# Patient Record
Sex: Male | Born: 1955
Health system: Southern US, Community
[De-identification: ages and names within clinical notes are randomized; demographics above are authoritative.]

## PROBLEM LIST (undated history)

## (undated) DIAGNOSIS — R0602 Shortness of breath: Secondary | ICD-10-CM

## (undated) DIAGNOSIS — Z87442 Personal history of urinary calculi: Secondary | ICD-10-CM

## (undated) DIAGNOSIS — K7460 Unspecified cirrhosis of liver: Secondary | ICD-10-CM

## (undated) DIAGNOSIS — Z950 Presence of cardiac pacemaker: Secondary | ICD-10-CM

## (undated) DIAGNOSIS — B029 Zoster without complications: Secondary | ICD-10-CM

## (undated) DIAGNOSIS — M1990 Unspecified osteoarthritis, unspecified site: Secondary | ICD-10-CM

## (undated) DIAGNOSIS — Z8349 Family history of other endocrine, nutritional and metabolic diseases: Secondary | ICD-10-CM

## (undated) DIAGNOSIS — E079 Disorder of thyroid, unspecified: Secondary | ICD-10-CM

## (undated) DIAGNOSIS — I2510 Atherosclerotic heart disease of native coronary artery without angina pectoris: Secondary | ICD-10-CM

## (undated) DIAGNOSIS — Z95810 Presence of automatic (implantable) cardiac defibrillator: Secondary | ICD-10-CM

## (undated) DIAGNOSIS — K219 Gastro-esophageal reflux disease without esophagitis: Secondary | ICD-10-CM

## (undated) DIAGNOSIS — G4733 Obstructive sleep apnea (adult) (pediatric): Secondary | ICD-10-CM

## (undated) DIAGNOSIS — J449 Chronic obstructive pulmonary disease, unspecified: Secondary | ICD-10-CM

## (undated) DIAGNOSIS — E039 Hypothyroidism, unspecified: Secondary | ICD-10-CM

## (undated) DIAGNOSIS — R001 Bradycardia, unspecified: Secondary | ICD-10-CM

## (undated) DIAGNOSIS — E785 Hyperlipidemia, unspecified: Secondary | ICD-10-CM

## (undated) HISTORY — PX: NECK SURGERY: SHX720

## (undated) HISTORY — DX: Hyperlipidemia, unspecified: E78.5

## (undated) HISTORY — DX: Unspecified osteoarthritis, unspecified site: M19.90

## (undated) HISTORY — DX: Shortness of breath: R06.02

## (undated) HISTORY — DX: Atherosclerotic heart disease of native coronary artery without angina pectoris: I25.10

## (undated) HISTORY — DX: Disorder of thyroid, unspecified: E07.9

## (undated) HISTORY — DX: Family history of other endocrine, nutritional and metabolic diseases: Z83.49

## (undated) HISTORY — DX: Bradycardia, unspecified: R00.1

## (undated) HISTORY — DX: Gastro-esophageal reflux disease without esophagitis: K21.9

## (undated) HISTORY — PX: KNEE SURGERY: SHX244

## (undated) HISTORY — DX: Obstructive sleep apnea (adult) (pediatric): G47.33

## (undated) HISTORY — PX: HEEL SPUR SURGERY: SHX665

---

## 2008-04-25 ENCOUNTER — Emergency Department (HOSPITAL_COMMUNITY): Admission: EM | Admit: 2008-04-25 | Discharge: 2008-04-26 | Payer: Self-pay | Admitting: Emergency Medicine

## 2010-11-17 DIAGNOSIS — B029 Zoster without complications: Secondary | ICD-10-CM

## 2010-11-17 HISTORY — DX: Zoster without complications: B02.9

## 2011-08-14 LAB — URINALYSIS, ROUTINE W REFLEX MICROSCOPIC
Bilirubin Urine: NEGATIVE
Glucose, UA: NEGATIVE
Ketones, ur: NEGATIVE
Leukocytes, UA: NEGATIVE
Nitrite: NEGATIVE
Protein, ur: NEGATIVE
Specific Gravity, Urine: 1.022
Urobilinogen, UA: 1
pH: 5.5

## 2011-08-14 LAB — URINE MICROSCOPIC-ADD ON

## 2016-11-12 ENCOUNTER — Other Ambulatory Visit: Payer: Self-pay | Admitting: Family Medicine

## 2016-11-12 DIAGNOSIS — F17210 Nicotine dependence, cigarettes, uncomplicated: Secondary | ICD-10-CM

## 2016-11-18 ENCOUNTER — Ambulatory Visit
Admission: RE | Admit: 2016-11-18 | Discharge: 2016-11-18 | Disposition: A | Discharge status: Home / Self Care | Payer: 59 | Source: Ambulatory Visit | Attending: Family Medicine | Admitting: Family Medicine

## 2016-11-18 DIAGNOSIS — F17210 Nicotine dependence, cigarettes, uncomplicated: Secondary | ICD-10-CM

## 2017-01-05 ENCOUNTER — Telehealth: Payer: Self-pay | Admitting: Cardiology

## 2017-01-05 ENCOUNTER — Encounter: Payer: Self-pay | Admitting: Cardiology

## 2017-01-05 NOTE — Telephone Encounter (Signed)
NOTES FAXED TO [facility] °

## 2017-01-05 NOTE — Telephone Encounter (Signed)
Received records from [facility] for appointment on 01/14/17 with Dr Martinique.  Records put with Dr Doug Sou schedule for 2/28/281. lp

## 2017-01-13 NOTE — Progress Notes (Signed)
Cardiology Office Note    Date:  01/14/2017   ID:  Austin, Oliver 21-Aug-1956, MRN [id number]  PCP:  [name], MD  Cardiologist:  Michaeljoseph Revolorio Martinique, MD    History of Present Illness:  Austin Oliver is a 61 y.o. male seen at the request of Dr. Arelia Sneddon for pre op cardiac evaluation for knee surgery. He reports I saw him > 20 yrs ago for evaluation of chest pain. He had a normal stress test and was treated for GERD. He has extensive joint disease with multiple surgeries in the past. Now needs left hip surgery. Seeing Dr. Delfino Lovett. Noted on physical to have marked bradycardia with HR down to 42. States HR has been slow for last year. Denies any dizziness or syncope. Some fatigue but has been more inactive due to joint problems. No SOB or chest pain. He does have a history of hyperlipidemia and smoking. Recently quit smoking in January. Has been on a statin but complained of myalgias. Dose cut to every other day. He did have a chest CT in January for cancer screening. This demonstrated extensive coronary calcification.   Past Medical History:  Diagnosis Date  . Bradycardia   . Coronary artery calcification seen on CAT scan 01/14/2017  . Family history of thyroid problem   . GERD (gastroesophageal reflux disease)   . Hyperlipidemia   . Osteoarthritis   . Thyroid disease     Past Surgical History:  Procedure Laterality Date  . HEEL SPUR SURGERY    . KNEE SURGERY Bilateral   . NECK SURGERY      Current Medications: Outpatient Medications Prior to Visit  Medication Sig Dispense Refill  . aspirin EC 81 MG tablet Take 81 mg by mouth daily.    . cimetidine (TAGAMET) 800 MG tablet Take 800 mg by mouth at bedtime.    Marland Kitchen levothyroxine (SYNTHROID, LEVOTHROID) 75 MCG tablet Take 75 mcg by mouth daily before breakfast.    . loteprednol (LOTEMAX) 0.5 % ophthalmic suspension Place 1 drop into the left eye daily.    Marland Kitchen lovastatin (MEVACOR) 20 MG tablet Take 20 mg by mouth at bedtime.    .  Omega-3 Fatty Acids (FISH OIL OMEGA-3) 1000 MG CAPS Take 1 capsule by mouth daily.    Marland Kitchen terazosin (HYTRIN) 5 MG capsule Take 5 mg by mouth at bedtime.    . traMADol (ULTRAM) 50 MG tablet Take 50 mg by mouth every 6 (six) hours as needed for moderate pain.    Marland Kitchen trifluridine (VIROPTIC) 1 % ophthalmic solution Place 1 drop into the left eye daily.     . valACYclovir (VALTREX) 500 MG tablet Take 500 mg by mouth daily.     No facility-administered medications prior to visit.      Allergies:   Clindamycin/lincomycin   Social History   Social History  . Marital status: Married    Spouse name: [name]  . Number of children: 2  . Years of education: N/A   Occupational History  . RETIRED-check printer    Social History Main Topics  . Smoking status: Former Smoker    Quit date: 12/05/2016  . Smokeless tobacco: Never Used  . Alcohol use No  . Drug use: No  . Sexual activity: Not Asked   Other Topics Concern  . None   Social History Narrative  . None     Family History:  The patient's family history includes COPD in his father; Dementia in his mother; Heart Problems in  his father; Lung cancer in his father.   ROS:   Please see the history of present illness.    ROS All other systems reviewed and are negative.   PHYSICAL EXAM:   VS:  BP 132/76   Pulse (!) 48   Ht 5\' 9"  (1.753 m)   Wt 236 lb (107 kg)   SpO2 97%   BMI 34.85 kg/m    GEN: Well nourished, well developed, in no acute distress  HEENT: normal  Neck: no JVD, carotid bruits, or masses Cardiac: RRR; no murmurs, rubs, or gallops,no edema  Respiratory:  clear to auscultation bilaterally, normal work of breathing GI: soft, nontender, nondistended, + BS MS: no deformity or atrophy  Skin: warm and dry, no rash Neuro:  Alert and Oriented x 3, Strength and sensation are intact Psych: euthymic mood, full affect  Wt Readings from Last 3 Encounters:  01/14/17 236 lb (107 kg)      Studies/Labs Reviewed:   EKG:  EKG is  not ordered today.  The ekg from Dr. Arelia Sneddon shows marked sinus brady. Otherwise normal. I have personally reviewed and interpreted this study.   Recent Labs: No results found for requested labs within last 8760 hours.   Lipid Panel No results found for: CHOL, TRIG, HDL, CHOLHDL, VLDL, LDLCALC, LDLDIRECT  Additional studies/ records that were reviewed today include:  Labs from 12/24/16: TSH 5.59, glucose 93, creatinine 1.23. Hgb 13.1.   ASSESSMENT:    1. Mixed hyperlipidemia   2. Sinus bradycardia   3. Coronary artery calcification seen on CAT scan      PLAN:   1. Patient has extensive coronary calcification on CT. He is asymptomatic. This indicates he has CAD and would argue for more aggressive lipid lowering therapy. Continue ASA. Will arrange for a Lexiscan myoview to assess cardiac risk especially given need for general anesthesia.  2. He does have bradycardia but really no significant symptoms. He is on no rate slowing medication. Will have him wear a 24 hour Holter monitor to look for more serious pauses. If he just has sinus brady and is asymptomatic- no further treatment needed.  3. Agree with statin therapy. If unable to tolerate lovastatin would try a different statin at low dose such as Crestor. Continue fish oil for elevated triglycerides per history.     Medication Adjustments/Labs and Tests Ordered: Current medicines are reviewed at length with the patient today.  Concerns regarding medicines are outlined above.  Medication changes, Labs and Tests ordered today are listed in the Patient Instructions below. Patient Instructions  We will have you wear a monitor for 24 hours  We will schedule you for a nuclear stress test   If these studies are ok we will clear you for hip surgery.  Work with Dr Arelia Sneddon on your cholesterol      Signed, Akeya Ryther Martinique, MD  01/14/2017 10:04 AM    [facility] [address], [place], [place],  [address] [phone]

## 2017-01-14 ENCOUNTER — Ambulatory Visit (INDEPENDENT_AMBULATORY_CARE_PROVIDER_SITE_OTHER): Payer: 59 | Admitting: Cardiology

## 2017-01-14 ENCOUNTER — Encounter: Payer: Self-pay | Admitting: Cardiology

## 2017-01-14 DIAGNOSIS — R001 Bradycardia, unspecified: Secondary | ICD-10-CM | POA: Diagnosis not present

## 2017-01-14 DIAGNOSIS — I25118 Atherosclerotic heart disease of native coronary artery with other forms of angina pectoris: Secondary | ICD-10-CM | POA: Insufficient documentation

## 2017-01-14 DIAGNOSIS — E782 Mixed hyperlipidemia: Secondary | ICD-10-CM | POA: Insufficient documentation

## 2017-01-14 DIAGNOSIS — I2510 Atherosclerotic heart disease of native coronary artery without angina pectoris: Secondary | ICD-10-CM

## 2017-01-14 DIAGNOSIS — E785 Hyperlipidemia, unspecified: Secondary | ICD-10-CM | POA: Insufficient documentation

## 2017-01-14 HISTORY — DX: Atherosclerotic heart disease of native coronary artery without angina pectoris: I25.10

## 2017-01-14 NOTE — Patient Instructions (Signed)
We will have you wear a monitor for 24 hours  We will schedule you for a nuclear stress test   If these studies are ok we will clear you for hip surgery.  Work with Dr Arelia Sneddon on your cholesterol

## 2017-01-15 ENCOUNTER — Telehealth (HOSPITAL_COMMUNITY): Payer: Self-pay

## 2017-01-15 NOTE — Telephone Encounter (Signed)
Encounter complete. 

## 2017-01-16 ENCOUNTER — Ambulatory Visit (HOSPITAL_COMMUNITY)
Admission: RE | Admit: 2017-01-16 | Discharge: 2017-01-16 | Disposition: A | Discharge status: Home / Self Care | Payer: 59 | Source: Ambulatory Visit | Attending: Cardiovascular Disease | Admitting: Cardiovascular Disease

## 2017-01-16 DIAGNOSIS — Z87891 Personal history of nicotine dependence: Secondary | ICD-10-CM | POA: Diagnosis not present

## 2017-01-16 DIAGNOSIS — E782 Mixed hyperlipidemia: Secondary | ICD-10-CM | POA: Diagnosis not present

## 2017-01-16 DIAGNOSIS — R001 Bradycardia, unspecified: Secondary | ICD-10-CM | POA: Diagnosis not present

## 2017-01-16 DIAGNOSIS — E669 Obesity, unspecified: Secondary | ICD-10-CM | POA: Diagnosis not present

## 2017-01-16 DIAGNOSIS — I2510 Atherosclerotic heart disease of native coronary artery without angina pectoris: Secondary | ICD-10-CM

## 2017-01-16 DIAGNOSIS — Z6834 Body mass index (BMI) 34.0-34.9, adult: Secondary | ICD-10-CM | POA: Insufficient documentation

## 2017-01-16 DIAGNOSIS — Z8249 Family history of ischemic heart disease and other diseases of the circulatory system: Secondary | ICD-10-CM | POA: Insufficient documentation

## 2017-01-16 LAB — MYOCARDIAL PERFUSION IMAGING
LV dias vol: 158 mL (ref 62–150)
LV sys vol: 89 mL
Peak HR: 58 {beats}/min
Rest HR: 42 {beats}/min
SDS: 0
SRS: 1
SSS: 1
TID: 1.03

## 2017-01-16 MED ORDER — TECHNETIUM TC 99M TETROFOSMIN IV KIT
32.0000 | PACK | Freq: Once | INTRAVENOUS | Status: AC | PRN
  Administered 2017-01-16: 32 via INTRAVENOUS
  Filled 2017-01-16: qty 32

## 2017-01-16 MED ORDER — TECHNETIUM TC 99M TETROFOSMIN IV KIT
9.7000 | PACK | Freq: Once | INTRAVENOUS | Status: AC
  Administered 2017-01-16: 9.7 via INTRAVENOUS
  Filled 2017-01-16: qty 10

## 2017-01-16 MED ORDER — REGADENOSON 0.4 MG/5ML IV SOLN
0.4000 mg | Freq: Once | INTRAVENOUS | Status: AC
  Administered 2017-01-16: 0.4 mg via INTRAVENOUS

## 2017-01-19 ENCOUNTER — Other Ambulatory Visit: Payer: Self-pay

## 2017-01-19 DIAGNOSIS — R001 Bradycardia, unspecified: Secondary | ICD-10-CM

## 2017-01-19 DIAGNOSIS — I2510 Atherosclerotic heart disease of native coronary artery without angina pectoris: Secondary | ICD-10-CM

## 2017-01-20 ENCOUNTER — Ambulatory Visit (INDEPENDENT_AMBULATORY_CARE_PROVIDER_SITE_OTHER): Payer: 59

## 2017-01-20 DIAGNOSIS — I2510 Atherosclerotic heart disease of native coronary artery without angina pectoris: Secondary | ICD-10-CM | POA: Diagnosis not present

## 2017-01-20 DIAGNOSIS — R001 Bradycardia, unspecified: Secondary | ICD-10-CM

## 2017-01-20 DIAGNOSIS — E782 Mixed hyperlipidemia: Secondary | ICD-10-CM | POA: Diagnosis not present

## 2017-01-20 NOTE — Telephone Encounter (Signed)
Encounter complete. 

## 2017-02-02 ENCOUNTER — Telehealth: Payer: Self-pay

## 2017-02-02 NOTE — Telephone Encounter (Signed)
Received a request from [place] office requesting 01/16/17 Lexiscan report and 01/20/17 Holter Monitor.Reports sent to Dr.Elkins via epic.

## 2017-02-04 ENCOUNTER — Ambulatory Visit (HOSPITAL_COMMUNITY): Payer: 59 | Attending: Internal Medicine

## 2017-02-04 ENCOUNTER — Telehealth: Payer: Self-pay

## 2017-02-04 ENCOUNTER — Other Ambulatory Visit: Payer: Self-pay

## 2017-02-04 DIAGNOSIS — R001 Bradycardia, unspecified: Secondary | ICD-10-CM | POA: Diagnosis present

## 2017-02-04 DIAGNOSIS — I501 Left ventricular failure: Secondary | ICD-10-CM | POA: Insufficient documentation

## 2017-02-04 DIAGNOSIS — I517 Cardiomegaly: Secondary | ICD-10-CM | POA: Diagnosis not present

## 2017-02-04 DIAGNOSIS — I2510 Atherosclerotic heart disease of native coronary artery without angina pectoris: Secondary | ICD-10-CM

## 2017-02-04 NOTE — Telephone Encounter (Signed)
Received a request from Dr.Elkin's office.01/20/17 Holter Monitor and 01/16/17 Myoview reports faxed to Dr.Elkin's at fax # [phone].

## 2017-02-13 ENCOUNTER — Telehealth: Payer: Self-pay

## 2017-02-13 NOTE — Telephone Encounter (Signed)
Called patient Dr.Jordan advised he only has to follow up as needed.Advised to follow up cholesterol with PCP Dr.Elkins.

## 2017-02-16 ENCOUNTER — Telehealth: Payer: Self-pay

## 2017-02-16 NOTE — Telephone Encounter (Signed)
Received surgical clearance from  Orthopaedics.Dr.Jordan cleared patient for upcoming surgery.Form faxed back to fax # [phone]. 

## 2017-02-20 ENCOUNTER — Other Ambulatory Visit: Payer: Self-pay | Admitting: Orthopedic Surgery

## 2017-02-25 ENCOUNTER — Ambulatory Visit: Payer: Self-pay | Admitting: Orthopedic Surgery

## 2017-02-25 NOTE — H&P (Signed)
TOTAL HIP ADMISSION H&P  Patient is admitted for left total hip arthroplasty.  Subjective:  Chief Complaint: left hip pain  HPI: Austin Oliver, 61 y.o. 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  Onset of symptoms was gradual starting 2 years ago with rapidlly worsening course since that time.The patient noted no past surgery on the left hip(s).  Patient currently rates pain in the left hip at 10 out of 10 with activity. Patient has night pain, worsening of pain with activity and weight bearing, pain that interfers with activities of daily living, pain with passive range of motion and crepitus. Patient has evidence of joint space narrowing and AVN with collapse by imaging studies. This condition presents safety issues increasing the risk of falls. This patient has had avascular necrosis of the hip, acetabular fracture, hip dysplasia.  There is no current active infection.  Patient Active Problem List   Diagnosis Date Noted  . Hyperlipidemia 01/14/2017  . Sinus bradycardia 01/14/2017  . Coronary artery calcification seen on CAT scan 01/14/2017   Past Medical History:  Diagnosis Date  . Bradycardia   . Coronary artery calcification seen on CAT scan 01/14/2017  . Family history of thyroid problem   . GERD (gastroesophageal reflux disease)   . Hyperlipidemia   . Osteoarthritis   . Thyroid disease     Past Surgical History:  Procedure Laterality Date  . HEEL SPUR SURGERY    . KNEE SURGERY Bilateral   . NECK SURGERY       (Not in a hospital admission) Allergies  Allergen Reactions  . Clindamycin/Lincomycin     Social History  Substance Use Topics  . Smoking status: Former Smoker    Quit date:  12/05/2016  . Smokeless tobacco: Never Used  . Alcohol use No    Family History  Problem Relation Age of Onset  . Dementia Mother   . Heart Problems Father   . COPD Father   . Lung cancer Father      Review of Systems  Constitutional: Negative.   HENT: Negative.   Eyes: Negative.   Respiratory: Negative.   Cardiovascular: Negative.   Gastrointestinal: Negative.   Genitourinary: Negative.   Musculoskeletal: Positive for joint pain.  Skin: Negative.   Neurological: Negative.   Endo/Heme/Allergies: Negative.   Psychiatric/Behavioral: Negative.     Objective:  Physical Exam  Vitals reviewed. Constitutional: He is oriented to person, place, and time. He appears well-developed and well-nourished.  HENT:  Head: Normocephalic and atraumatic.  Eyes: Conjunctivae and EOM are normal. Pupils are equal, round, and reactive to light.  Neck: Normal range of motion. Neck supple.  Cardiovascular: Normal rate, regular rhythm and intact distal pulses.   Respiratory: Effort normal. No respiratory distress.  GI: Soft. He exhibits no distension.  Genitourinary:  Genitourinary Comments: deferred  Musculoskeletal:       Left hip: He exhibits decreased range of motion, decreased strength and bony tenderness.  Neurological: He is alert and oriented to person, place, and time. He has normal reflexes.  Skin: Skin is warm.  Psychiatric: He has a normal mood and affect. His behavior is normal. Judgment and thought content normal.    Vital signs in last 24 hours: @VSRANGES @  Labs:   Estimated body mass  index is 34.85 kg/m as calculated from the following:   Height as of 01/16/17: 5\' 9"  (1.753 m).   Weight as of 01/16/17: 107 kg (236 lb).   Imaging Review Plain radiographs demonstrate severe degenerative joint disease of the left hip(s). The bone quality appears to be adequate for age and reported activity level.  Assessment/Plan:  End stage arthritis, left hip(s)  The patient history,  physical examination, clinical judgement of the provider and imaging studies are consistent with end stage degenerative joint disease of the left hip(s) and total hip arthroplasty is deemed medically necessary. The treatment options including medical management, injection therapy, arthroscopy and arthroplasty were discussed at length. The risks and benefits of total hip arthroplasty were presented and reviewed. The risks due to aseptic loosening, infection, stiffness, dislocation/subluxation,  thromboembolic complications and other imponderables were discussed.  The patient acknowledged the explanation, agreed to proceed with the plan and consent was signed. Patient is being admitted for inpatient treatment for surgery, pain control, PT, OT, prophylactic antibiotics, VTE prophylaxis, progressive ambulation and ADL's and discharge planning.The patient is planning to be discharged home with HEP.

## 2017-02-27 ENCOUNTER — Ambulatory Visit: Payer: Self-pay | Admitting: Orthopedic Surgery

## 2017-02-27 ENCOUNTER — Other Ambulatory Visit: Payer: Self-pay | Admitting: Orthopedic Surgery

## 2017-03-03 ENCOUNTER — Encounter (HOSPITAL_COMMUNITY)
Admission: RE | Admit: 2017-03-03 | Discharge: 2017-03-03 | Disposition: A | Discharge status: Home / Self Care | Payer: 59 | Source: Ambulatory Visit | Attending: Orthopedic Surgery | Admitting: Orthopedic Surgery

## 2017-03-03 ENCOUNTER — Encounter (HOSPITAL_COMMUNITY): Payer: Self-pay

## 2017-03-03 DIAGNOSIS — Z79899 Other long term (current) drug therapy: Secondary | ICD-10-CM | POA: Diagnosis not present

## 2017-03-03 DIAGNOSIS — K219 Gastro-esophageal reflux disease without esophagitis: Secondary | ICD-10-CM | POA: Diagnosis not present

## 2017-03-03 DIAGNOSIS — Z01812 Encounter for preprocedural laboratory examination: Secondary | ICD-10-CM | POA: Diagnosis present

## 2017-03-03 DIAGNOSIS — Z7982 Long term (current) use of aspirin: Secondary | ICD-10-CM | POA: Diagnosis not present

## 2017-03-03 DIAGNOSIS — E039 Hypothyroidism, unspecified: Secondary | ICD-10-CM | POA: Insufficient documentation

## 2017-03-03 DIAGNOSIS — Z0183 Encounter for blood typing: Secondary | ICD-10-CM | POA: Diagnosis not present

## 2017-03-03 DIAGNOSIS — Z87891 Personal history of nicotine dependence: Secondary | ICD-10-CM | POA: Insufficient documentation

## 2017-03-03 DIAGNOSIS — E785 Hyperlipidemia, unspecified: Secondary | ICD-10-CM | POA: Insufficient documentation

## 2017-03-03 DIAGNOSIS — M1612 Unilateral primary osteoarthritis, left hip: Secondary | ICD-10-CM | POA: Insufficient documentation

## 2017-03-03 DIAGNOSIS — Z87442 Personal history of urinary calculi: Secondary | ICD-10-CM | POA: Diagnosis not present

## 2017-03-03 HISTORY — DX: Zoster without complications: B02.9

## 2017-03-03 HISTORY — DX: Personal history of urinary calculi: Z87.442

## 2017-03-03 HISTORY — DX: Hypothyroidism, unspecified: E03.9

## 2017-03-03 LAB — CBC
HCT: 39.2 % (ref 39.0–52.0)
Hemoglobin: 13.6 g/dL (ref 13.0–17.0)
MCH: 32.2 pg (ref 26.0–34.0)
MCHC: 34.7 g/dL (ref 30.0–36.0)
MCV: 92.7 fL (ref 78.0–100.0)
Platelets: 119 10*3/uL — ABNORMAL LOW (ref 150–400)
RBC: 4.23 MIL/uL (ref 4.22–5.81)
RDW: 13.1 % (ref 11.5–15.5)
WBC: 3.6 10*3/uL — ABNORMAL LOW (ref 4.0–10.5)

## 2017-03-03 LAB — BASIC METABOLIC PANEL
Anion gap: 8 (ref 5–15)
BUN: 17 mg/dL (ref 6–20)
CO2: 24 mmol/L (ref 22–32)
Calcium: 9.2 mg/dL (ref 8.9–10.3)
Chloride: 107 mmol/L (ref 101–111)
Creatinine, Ser: 1.19 mg/dL (ref 0.61–1.24)
GFR calc Af Amer: >60 mL/min (ref >60)
GFR calc non Af Amer: >60 mL/min (ref >60)
Glucose, Bld: 109 mg/dL — ABNORMAL HIGH (ref 65–99)
Potassium: 4.3 mmol/L (ref 3.5–5.1)
Sodium: 139 mmol/L (ref 135–145)

## 2017-03-03 LAB — TYPE AND SCREEN
ABO/RH(D): O POS
Antibody Screen: NEGATIVE

## 2017-03-03 LAB — SURGICAL PCR SCREEN
MRSA, PCR: NEGATIVE
Staphylococcus aureus: NEGATIVE

## 2017-03-03 LAB — ABO/RH: ABO/RH(D): O POS

## 2017-03-03 NOTE — Pre-Procedure Instructions (Addendum)
KEKOA FYOCK  03/03/2017      [facility] - [place] - [address] [address] [place] [address] Phone: [phone] Fax: [phone]    Your procedure is scheduled on 03/09/17.  Report to [facility] Admitting at 845 A.M.  Call this number if you have problems the morning of surgery:  [phone]   Remember:  Do not eat food or drink liquids after midnight.  Take these medicines the morning of surgery with A SIP OF WATER     Levothyroxine(synthroid),eye drops as needed except ketotifen(stop today), tramadol if needed,valtrex if needed  STOP all herbel meds, nsaids (aleve,naproxen,advil,ibuprofen)   Starting today(03/03/17) including all vitamins/supplements, aspirin,coq10,fish oil   Do not wear jewelry, make-up or nail polish.  Do not wear lotions, powders, or perfumes, or deoderant.  Do not shave 48 hours prior to surgery.  Men may shave face and neck.  Do not bring valuables to the hospital.  [facility] is not responsible for any belongings or valuables.  Contacts, dentures or bridgework may not be worn into surgery.  Leave your suitcase in the car.  After surgery it may be brought to your room.  For patients admitted to the hospital, discharge time will be determined by your treatment team.  Patients discharged the day of surgery will not be allowed to drive home.   Special instructions:   Special Instructions: [facility] - Preparing for Surgery  Before surgery, you can play an important role.  Because skin is not sterile, your skin needs to be as free of germs as possible.  You can reduce the number of germs on you skin by washing with CHG (chlorahexidine gluconate) soap before surgery.  CHG is an antiseptic cleaner which kills germs and bonds with the skin to continue killing germs even after washing.  Please DO NOT use if you have an allergy to CHG or antibacterial soaps.  If your skin becomes  reddened/irritated stop using the CHG and inform your nurse when you arrive at Short Stay.  Do not shave (including legs and underarms) for at least 48 hours prior to the first CHG shower.  You may shave your face.  Please follow these instructions carefully:   1.  Shower with CHG Soap the night before surgery and the morning of Surgery.  2.  If you choose to wash your hair, wash your hair first as usual with your normal shampoo.  3.  After you shampoo, rinse your hair and body thoroughly to remove the Shampoo.  4.  Use CHG as you would any other liquid soap.  You can apply chg directly  to the skin and wash gently with scrungie or a clean washcloth.  5.  Apply the CHG Soap to your body ONLY FROM THE NECK DOWN.  Do not use on open wounds or open sores.  Avoid contact with your eyes ears, mouth and genitals (private parts).  Wash genitals (private parts)       with your normal soap.  6.  Wash thoroughly, paying special attention to the area where your surgery will be performed.  7.  Thoroughly rinse your body with warm water from the neck down.  8.  DO NOT shower/wash with your normal soap after using and rinsing off the CHG Soap.  9.  Pat yourself dry with a clean towel.            10.  Wear clean  pajamas.            11.  Place clean sheets on your bed the night of your first shower and do not sleep with pets.  Day of Surgery  Do not apply any lotions/deodorants the morning of surgery.  Please wear clean clothes to the hospital/surgery center.  Please read over the  fact sheets that you were given.

## 2017-03-05 NOTE — Progress Notes (Signed)
Anesthesia Chart Review: Patient is a 61 year old male scheduled for left THA, anterior approach on 03/09/17 by Dr. Lyla Glassing.  History includes former smoker (quit 12/05/16), bradycardia, coronary calcifications (seen on chest CT; non-ischemic stress test 01/16/17), HLD, hypothyroidism, GERD, nephrolithiasis, neck surgery. BMI is consistent with obesity.  - PCP is Dr. Arelia Sneddon. He cleared for surgery with recommendation for cardiology referral, likely due to bradycardia and finding of coronary calcifications on recent chest CT.  - Cardiologist is Dr. Martinique. He signed a letter of cardiac clearance for surgery following recent stress, echo, and Holter monitor.   Meds include aspirin 81 mg, levothyroxine, Viroptic ophthalmic, Lotemax ophthalmic, Alaway ophthalmic, lovastatin, fish oil, Hytrin, tramadol, Valtrex  BP (!) 155/60   Pulse (!) 45 Comment: notified Kaye RN  Temp 36.6 C   Resp 20   Ht 5' 8.5" (1.74 m)   Wt 240 lb 8 oz (109.1 kg)   SpO2 99%   BMI 36.04 kg/m   EKG 01/20/17: SB at 41 bpm.  Echo 02/04/17: Study Conclusions - Left ventricle: The cavity size was normal. Wall thickness was   increased in a pattern of mild LVH. Systolic function was normal.   The estimated ejection fraction was in the range of 60% to 65%.   Wall motion was normal; there were no regional wall motion   abnormalities. Doppler parameters are consistent with abnormal   left ventricular relaxation (grade 1 diastolic dysfunction). The   E/e&' ratio is between 8-15, suggesting indeterminate LV filling   pressure. - Left atrium: The atrium was normal in size. - Inferior vena cava: The vessel was normal in size. The   respirophasic diameter changes were in the normal range (>= 50%),   consistent with normal central venous pressure. Impressions: - LVEF 60-65%, mild LVH, diastolic dysfunction, indeterminate LV   filling pressure, normal LA size, normal IVC.  Nuclear stress test 01/16/17:  The left ventricular  ejection fraction is moderately decreased (30-44%).  Nuclear stress EF: 43%.  There was no ST segment deviation noted during stress.  This is a low risk study. Low risk stress nuclear study with no ischemia or infarction; EF 43 with global hypokinesis and mild LVE. Per Dr. Martinique 3/3/1/8, "Myoview shows no ischemia or infarction. EF is moderately low at 43%. Unable to take beta blocker due to bradycardia. I would like to assess LV function by Echo before recommending further therapy. With normal perfusion he is clear to proceed with surgery."  24 Hour Holter 01/20/17:  Sinus bradycardia  Rare isolated PVCs  Rare PACs and very rare PAC couplets.  No significant pauses or AV block Per Dr. Martinique 02/01/17, "He does have sinus bradycardia with average HR of 51. In the absence of symptoms no further treatment is needed."   CT chest Lung CA screening 11/18/16: IMPRESSION: 1. Lung-RADS Category 2-S, benign appearance or behavior. Continue annual screening with low-dose chest CT without contrast in 12 months. 2. The "S" modifier above refers to potentially clinically significant non lung cancer related findings. Specifically, three-vessel coronary atherosclerosis. 3. Aortic atherosclerosis. 4. Nonobstructing stone in the upper right kidney.  Preoperative labs noted. Cr 1.19. Glucose 109. H/H 13.6/39.2. PLT 119. T&S done.  If no acute changes then I would anticipate that he can proceed as planned.  George Hugh [facility] Short Stay Center/Anesthesiology Phone [phone] 03/05/2017 10:43 AM

## 2017-03-06 MED ORDER — TRANEXAMIC ACID 1000 MG/10ML IV SOLN
1000.0000 mg | INTRAVENOUS | Status: AC
  Administered 2017-03-09: 1000 mg via INTRAVENOUS
  Filled 2017-03-06: qty 10

## 2017-03-09 ENCOUNTER — Encounter (HOSPITAL_COMMUNITY)
Admission: RE | Disposition: A | Discharge status: Home Health | Payer: Self-pay | Source: Ambulatory Visit | Attending: Orthopedic Surgery

## 2017-03-09 ENCOUNTER — Inpatient Hospital Stay (HOSPITAL_COMMUNITY)
Admission: RE | Admit: 2017-03-09 | Discharge: 2017-03-11 | DRG: 470 | Disposition: A | Discharge status: Home Health | Payer: 59 | Source: Ambulatory Visit | Attending: Orthopedic Surgery | Admitting: Orthopedic Surgery

## 2017-03-09 ENCOUNTER — Inpatient Hospital Stay (HOSPITAL_COMMUNITY): Payer: 59

## 2017-03-09 ENCOUNTER — Inpatient Hospital Stay (HOSPITAL_COMMUNITY): Payer: 59 | Admitting: Anesthesiology

## 2017-03-09 ENCOUNTER — Encounter (HOSPITAL_COMMUNITY): Payer: Self-pay | Admitting: *Deleted

## 2017-03-09 ENCOUNTER — Inpatient Hospital Stay (HOSPITAL_COMMUNITY): Payer: 59 | Admitting: Emergency Medicine

## 2017-03-09 DIAGNOSIS — E785 Hyperlipidemia, unspecified: Secondary | ICD-10-CM | POA: Diagnosis present

## 2017-03-09 DIAGNOSIS — Z881 Allergy status to other antibiotic agents status: Secondary | ICD-10-CM | POA: Diagnosis not present

## 2017-03-09 DIAGNOSIS — Z836 Family history of other diseases of the respiratory system: Secondary | ICD-10-CM | POA: Diagnosis not present

## 2017-03-09 DIAGNOSIS — K219 Gastro-esophageal reflux disease without esophagitis: Secondary | ICD-10-CM | POA: Diagnosis present

## 2017-03-09 DIAGNOSIS — Z8249 Family history of ischemic heart disease and other diseases of the circulatory system: Secondary | ICD-10-CM

## 2017-03-09 DIAGNOSIS — M1990 Unspecified osteoarthritis, unspecified site: Secondary | ICD-10-CM | POA: Diagnosis present

## 2017-03-09 DIAGNOSIS — Z818 Family history of other mental and behavioral disorders: Secondary | ICD-10-CM

## 2017-03-09 DIAGNOSIS — Z419 Encounter for procedure for purposes other than remedying health state, unspecified: Secondary | ICD-10-CM

## 2017-03-09 DIAGNOSIS — Z09 Encounter for follow-up examination after completed treatment for conditions other than malignant neoplasm: Secondary | ICD-10-CM

## 2017-03-09 DIAGNOSIS — Z801 Family history of malignant neoplasm of trachea, bronchus and lung: Secondary | ICD-10-CM

## 2017-03-09 DIAGNOSIS — Z87891 Personal history of nicotine dependence: Secondary | ICD-10-CM | POA: Diagnosis not present

## 2017-03-09 DIAGNOSIS — M879 Osteonecrosis, unspecified: Principal | ICD-10-CM | POA: Diagnosis present

## 2017-03-09 DIAGNOSIS — M87052 Idiopathic aseptic necrosis of left femur: Secondary | ICD-10-CM

## 2017-03-09 HISTORY — PX: TOTAL HIP ARTHROPLASTY: SHX124

## 2017-03-09 SURGERY — ARTHROPLASTY, HIP, TOTAL, ANTERIOR APPROACH
Anesthesia: General | Site: Hip | Laterality: Left

## 2017-03-09 MED ORDER — VALACYCLOVIR HCL 500 MG PO TABS
500.0000 mg | ORAL_TABLET | Freq: Every day | ORAL | Status: DC
  Administered 2017-03-10 – 2017-03-11 (×2): 500 mg via ORAL
  Filled 2017-03-09 (×2): qty 1

## 2017-03-09 MED ORDER — KETOROLAC TROMETHAMINE 15 MG/ML IJ SOLN
15.0000 mg | Freq: Four times a day (QID) | INTRAMUSCULAR | Status: AC
  Administered 2017-03-09 – 2017-03-10 (×3): 15 mg via INTRAVENOUS
  Filled 2017-03-09 (×3): qty 1

## 2017-03-09 MED ORDER — TRIFLURIDINE 1 % OP SOLN
1.0000 [drp] | OPHTHALMIC | Status: DC
  Administered 2017-03-10: 1 [drp] via OPHTHALMIC
  Filled 2017-03-09 (×2): qty 7.5

## 2017-03-09 MED ORDER — DEXAMETHASONE SODIUM PHOSPHATE 10 MG/ML IJ SOLN
INTRAMUSCULAR | Status: AC
  Filled 2017-03-09: qty 1

## 2017-03-09 MED ORDER — PROPOFOL 10 MG/ML IV BOLUS
INTRAVENOUS | Status: DC | PRN
  Administered 2017-03-09: 200 mg via INTRAVENOUS

## 2017-03-09 MED ORDER — ACETAMINOPHEN 325 MG PO TABS: 650.0000 mg | ORAL_TABLET | Freq: Four times a day (QID) | ORAL | Status: DC | PRN

## 2017-03-09 MED ORDER — LIDOCAINE HCL (CARDIAC) 20 MG/ML IV SOLN
INTRAVENOUS | Status: DC | PRN
  Administered 2017-03-09: 100 mg via INTRAVENOUS

## 2017-03-09 MED ORDER — POLYETHYLENE GLYCOL 3350 17 G PO PACK: 17.0000 g | PACK | Freq: Every day | ORAL | Status: DC | PRN

## 2017-03-09 MED ORDER — HYDROMORPHONE HCL 1 MG/ML IJ SOLN
INTRAMUSCULAR | Status: AC
  Filled 2017-03-09: qty 0.5

## 2017-03-09 MED ORDER — BUPIVACAINE HCL (PF) 0.5 % IJ SOLN
INTRAMUSCULAR | Status: AC
  Filled 2017-03-09: qty 30

## 2017-03-09 MED ORDER — FENTANYL CITRATE (PF) 100 MCG/2ML IJ SOLN
INTRAMUSCULAR | Status: DC | PRN
  Administered 2017-03-09: 100 ug via INTRAVENOUS
  Administered 2017-03-09: 50 ug via INTRAVENOUS
  Administered 2017-03-09: 100 ug via INTRAVENOUS

## 2017-03-09 MED ORDER — ROCURONIUM BROMIDE 100 MG/10ML IV SOLN
INTRAVENOUS | Status: DC | PRN
  Administered 2017-03-09: 60 mg via INTRAVENOUS
  Administered 2017-03-09 (×2): 10 mg via INTRAVENOUS

## 2017-03-09 MED ORDER — KETOTIFEN FUMARATE 0.025 % OP SOLN
1.0000 [drp] | Freq: Two times a day (BID) | OPHTHALMIC | Status: DC | PRN
  Filled 2017-03-09: qty 5

## 2017-03-09 MED ORDER — DEXAMETHASONE SODIUM PHOSPHATE 10 MG/ML IJ SOLN
10.0000 mg | Freq: Once | INTRAMUSCULAR | Status: AC
  Administered 2017-03-10: 10 mg via INTRAVENOUS
  Filled 2017-03-09: qty 1

## 2017-03-09 MED ORDER — KETOROLAC TROMETHAMINE 30 MG/ML IJ SOLN
INTRAMUSCULAR | Status: DC | PRN
  Administered 2017-03-09: 30 mg via INTRAVENOUS

## 2017-03-09 MED ORDER — ACETAMINOPHEN 650 MG RE SUPP
650.0000 mg | Freq: Four times a day (QID) | RECTAL | Status: DC | PRN
Start: 2017-03-09 — End: 2017-03-11

## 2017-03-09 MED ORDER — LACTATED RINGERS IV SOLN
INTRAVENOUS | Status: DC
  Administered 2017-03-09: 10:00:00 via INTRAVENOUS

## 2017-03-09 MED ORDER — DOCUSATE SODIUM 100 MG PO CAPS
100.0000 mg | ORAL_CAPSULE | Freq: Two times a day (BID) | ORAL | Status: DC
  Administered 2017-03-09 – 2017-03-11 (×4): 100 mg via ORAL
  Filled 2017-03-09 (×4): qty 1

## 2017-03-09 MED ORDER — PROMETHAZINE HCL 25 MG/ML IJ SOLN: 6.2500 mg | INTRAMUSCULAR | Status: DC | PRN

## 2017-03-09 MED ORDER — ROCURONIUM BROMIDE 10 MG/ML (PF) SYRINGE
PREFILLED_SYRINGE | INTRAVENOUS | Status: AC
  Filled 2017-03-09: qty 5

## 2017-03-09 MED ORDER — METHOCARBAMOL 500 MG PO TABS
ORAL_TABLET | ORAL | Status: AC
  Filled 2017-03-09: qty 1

## 2017-03-09 MED ORDER — ONDANSETRON HCL 4 MG/2ML IJ SOLN: 4.0000 mg | Freq: Four times a day (QID) | INTRAMUSCULAR | Status: DC | PRN

## 2017-03-09 MED ORDER — PRAVASTATIN SODIUM 20 MG PO TABS
20.0000 mg | ORAL_TABLET | ORAL | Status: DC
  Filled 2017-03-09 (×2): qty 1

## 2017-03-09 MED ORDER — HYDROMORPHONE HCL 1 MG/ML IJ SOLN
INTRAMUSCULAR | Status: AC
  Filled 2017-03-09: qty 1

## 2017-03-09 MED ORDER — HYDROCODONE-ACETAMINOPHEN 5-325 MG PO TABS
ORAL_TABLET | ORAL | Status: AC
  Filled 2017-03-09: qty 2

## 2017-03-09 MED ORDER — PHENOL 1.4 % MT LIQD
1.0000 | OROMUCOSAL | Status: DC | PRN
Start: 2017-03-09 — End: 2017-03-11

## 2017-03-09 MED ORDER — EPINEPHRINE PF 1 MG/ML IJ SOLN
INTRAMUSCULAR | Status: DC | PRN
  Administered 2017-03-09: 1 mg

## 2017-03-09 MED ORDER — CO Q-10 100 MG PO CAPS: 100.0000 mg | ORAL_CAPSULE | Freq: Every day | ORAL | Status: DC

## 2017-03-09 MED ORDER — MIDAZOLAM HCL 2 MG/2ML IJ SOLN
INTRAMUSCULAR | Status: AC
  Filled 2017-03-09: qty 2

## 2017-03-09 MED ORDER — METOCLOPRAMIDE HCL 5 MG/ML IJ SOLN: 5.0000 mg | Freq: Three times a day (TID) | INTRAMUSCULAR | Status: DC | PRN

## 2017-03-09 MED ORDER — BUPIVACAINE HCL (PF) 0.5 % IJ SOLN
INTRAMUSCULAR | Status: DC | PRN
  Administered 2017-03-09: 30 mL

## 2017-03-09 MED ORDER — ARTIFICIAL TEARS OP OINT
TOPICAL_OINTMENT | OPHTHALMIC | Status: DC | PRN
  Administered 2017-03-09: 1 via OPHTHALMIC

## 2017-03-09 MED ORDER — EPHEDRINE SULFATE 50 MG/ML IJ SOLN
INTRAMUSCULAR | Status: DC | PRN
  Administered 2017-03-09 (×2): 10 mg via INTRAVENOUS

## 2017-03-09 MED ORDER — DEXTROSE 5 % IV SOLN
INTRAVENOUS | Status: DC | PRN
  Administered 2017-03-09: 11:00:00 via INTRAVENOUS

## 2017-03-09 MED ORDER — TERAZOSIN HCL 5 MG PO CAPS
5.0000 mg | ORAL_CAPSULE | Freq: Every day | ORAL | Status: DC
  Administered 2017-03-09 – 2017-03-10 (×2): 5 mg via ORAL
  Filled 2017-03-09 (×3): qty 1

## 2017-03-09 MED ORDER — SODIUM CHLORIDE 0.9 % IV SOLN
INTRAVENOUS | Status: DC
  Administered 2017-03-09: 18:00:00 via INTRAVENOUS

## 2017-03-09 MED ORDER — HYDROMORPHONE HCL 1 MG/ML IJ SOLN
0.5000 mg | INTRAMUSCULAR | Status: DC | PRN
Start: 2017-03-09 — End: 2017-03-11

## 2017-03-09 MED ORDER — ARTIFICIAL TEARS OPHTHALMIC OINT
TOPICAL_OINTMENT | OPHTHALMIC | Status: AC
  Filled 2017-03-09: qty 3.5

## 2017-03-09 MED ORDER — ACETAMINOPHEN 10 MG/ML IV SOLN
INTRAVENOUS | Status: AC
  Filled 2017-03-09: qty 100

## 2017-03-09 MED ORDER — HYDROCODONE-ACETAMINOPHEN 5-325 MG PO TABS
1.0000 | ORAL_TABLET | ORAL | Status: DC | PRN
  Administered 2017-03-09 (×2): 2 via ORAL
  Administered 2017-03-10 – 2017-03-11 (×3): 1 via ORAL
  Filled 2017-03-09: qty 1
  Filled 2017-03-09: qty 2
  Filled 2017-03-09: qty 1
  Filled 2017-03-09: qty 2

## 2017-03-09 MED ORDER — ACETAMINOPHEN 10 MG/ML IV SOLN
1000.0000 mg | INTRAVENOUS | Status: AC
  Administered 2017-03-09: 1000 mg via INTRAVENOUS

## 2017-03-09 MED ORDER — METHOCARBAMOL 1000 MG/10ML IJ SOLN
500.0000 mg | Freq: Four times a day (QID) | INTRAVENOUS | Status: DC | PRN
  Filled 2017-03-09: qty 5

## 2017-03-09 MED ORDER — SODIUM CHLORIDE 0.9 % IR SOLN
Status: DC | PRN
  Administered 2017-03-09: 1000 mL

## 2017-03-09 MED ORDER — LIDOCAINE 2% (20 MG/ML) 5 ML SYRINGE
INTRAMUSCULAR | Status: AC
  Filled 2017-03-09: qty 5

## 2017-03-09 MED ORDER — LEVOTHYROXINE SODIUM 75 MCG PO TABS
75.0000 ug | ORAL_TABLET | Freq: Every day | ORAL | Status: DC
  Administered 2017-03-10 – 2017-03-11 (×3): 75 ug via ORAL
  Filled 2017-03-09 (×3): qty 1

## 2017-03-09 MED ORDER — TRANEXAMIC ACID 1000 MG/10ML IV SOLN
1000.0000 mg | Freq: Once | INTRAVENOUS | Status: AC
  Administered 2017-03-09: 1000 mg via INTRAVENOUS
  Filled 2017-03-09: qty 10

## 2017-03-09 MED ORDER — CHLORHEXIDINE GLUCONATE 4 % EX LIQD: 60.0000 mL | Freq: Once | CUTANEOUS | Status: DC

## 2017-03-09 MED ORDER — ONDANSETRON HCL 4 MG/2ML IJ SOLN
INTRAMUSCULAR | Status: AC
  Filled 2017-03-09: qty 2

## 2017-03-09 MED ORDER — EPHEDRINE 5 MG/ML INJ
INTRAVENOUS | Status: AC
  Filled 2017-03-09: qty 10

## 2017-03-09 MED ORDER — PHENYLEPHRINE HCL 10 MG/ML IJ SOLN
INTRAVENOUS | Status: DC | PRN
  Administered 2017-03-09: 45 ug/min via INTRAVENOUS

## 2017-03-09 MED ORDER — CEFAZOLIN SODIUM-DEXTROSE 2-4 GM/100ML-% IV SOLN
2.0000 g | INTRAVENOUS | Status: AC
  Administered 2017-03-09: 2 g via INTRAVENOUS
  Filled 2017-03-09: qty 100

## 2017-03-09 MED ORDER — SUGAMMADEX SODIUM 200 MG/2ML IV SOLN
INTRAVENOUS | Status: DC | PRN
  Administered 2017-03-09: 200 mg via INTRAVENOUS

## 2017-03-09 MED ORDER — SODIUM CHLORIDE 0.9 % IJ SOLN
INTRAMUSCULAR | Status: DC | PRN
  Administered 2017-03-09: 30 mL via INTRAVENOUS

## 2017-03-09 MED ORDER — ONDANSETRON HCL 4 MG/2ML IJ SOLN
INTRAMUSCULAR | Status: DC | PRN
  Administered 2017-03-09: 4 mg via INTRAVENOUS

## 2017-03-09 MED ORDER — DIPHENHYDRAMINE HCL 12.5 MG/5ML PO ELIX: 12.5000 mg | ORAL_SOLUTION | ORAL | Status: DC | PRN

## 2017-03-09 MED ORDER — GLYCOPYRROLATE 0.2 MG/ML IJ SOLN
INTRAMUSCULAR | Status: DC | PRN
Start: 2017-03-09 — End: 2017-03-09
  Administered 2017-03-09 (×2): 0.2 mg via INTRAVENOUS

## 2017-03-09 MED ORDER — LOTEPREDNOL ETABONATE 0.5 % OP SUSP
1.0000 [drp] | OPHTHALMIC | Status: DC
  Administered 2017-03-10: 1 [drp] via OPHTHALMIC
  Filled 2017-03-09: qty 5

## 2017-03-09 MED ORDER — CEFAZOLIN SODIUM-DEXTROSE 2-4 GM/100ML-% IV SOLN
2.0000 g | Freq: Four times a day (QID) | INTRAVENOUS | Status: AC
  Administered 2017-03-09 – 2017-03-10 (×2): 2 g via INTRAVENOUS
  Filled 2017-03-09 (×2): qty 100

## 2017-03-09 MED ORDER — DEXAMETHASONE SODIUM PHOSPHATE 10 MG/ML IJ SOLN
INTRAMUSCULAR | Status: DC | PRN
  Administered 2017-03-09: 10 mg via INTRAVENOUS

## 2017-03-09 MED ORDER — PROPOFOL 10 MG/ML IV BOLUS
INTRAVENOUS | Status: AC
  Filled 2017-03-09: qty 20

## 2017-03-09 MED ORDER — ONDANSETRON HCL 4 MG PO TABS: 4.0000 mg | ORAL_TABLET | Freq: Four times a day (QID) | ORAL | Status: DC | PRN

## 2017-03-09 MED ORDER — ALBUMIN HUMAN 5 % IV SOLN
INTRAVENOUS | Status: DC | PRN
  Administered 2017-03-09 (×2): via INTRAVENOUS

## 2017-03-09 MED ORDER — MIDAZOLAM HCL 5 MG/5ML IJ SOLN
INTRAMUSCULAR | Status: DC | PRN
  Administered 2017-03-09: 2 mg via INTRAVENOUS

## 2017-03-09 MED ORDER — SODIUM CHLORIDE 0.9 % IV SOLN: INTRAVENOUS | Status: DC

## 2017-03-09 MED ORDER — METHOCARBAMOL 500 MG PO TABS
500.0000 mg | ORAL_TABLET | Freq: Four times a day (QID) | ORAL | Status: DC | PRN
  Administered 2017-03-09 – 2017-03-11 (×4): 500 mg via ORAL
  Filled 2017-03-09 (×3): qty 1

## 2017-03-09 MED ORDER — 0.9 % SODIUM CHLORIDE (POUR BTL) OPTIME
TOPICAL | Status: DC | PRN
  Administered 2017-03-09: 1000 mL

## 2017-03-09 MED ORDER — HYDROMORPHONE HCL 1 MG/ML IJ SOLN
0.2500 mg | INTRAMUSCULAR | Status: DC | PRN
  Administered 2017-03-09: 1 mg via INTRAVENOUS
  Administered 2017-03-09: 0.5 mg via INTRAVENOUS

## 2017-03-09 MED ORDER — MENTHOL 3 MG MT LOZG
1.0000 | LOZENGE | OROMUCOSAL | Status: DC | PRN
Start: 2017-03-09 — End: 2017-03-11

## 2017-03-09 MED ORDER — FENTANYL CITRATE (PF) 250 MCG/5ML IJ SOLN
INTRAMUSCULAR | Status: AC
  Filled 2017-03-09: qty 5

## 2017-03-09 MED ORDER — LACTATED RINGERS IV SOLN
INTRAVENOUS | Status: DC | PRN
  Administered 2017-03-09 (×2): via INTRAVENOUS

## 2017-03-09 MED ORDER — SUGAMMADEX SODIUM 200 MG/2ML IV SOLN
INTRAVENOUS | Status: AC
  Filled 2017-03-09: qty 2

## 2017-03-09 MED ORDER — SENNA 8.6 MG PO TABS
2.0000 | ORAL_TABLET | Freq: Every day | ORAL | Status: DC
  Administered 2017-03-09 – 2017-03-10 (×2): 17.2 mg via ORAL
  Filled 2017-03-09 (×2): qty 2

## 2017-03-09 MED ORDER — ASPIRIN 81 MG PO CHEW
81.0000 mg | CHEWABLE_TABLET | Freq: Two times a day (BID) | ORAL | Status: DC
  Administered 2017-03-09 – 2017-03-11 (×4): 81 mg via ORAL
  Filled 2017-03-09 (×4): qty 1

## 2017-03-09 MED ORDER — METOCLOPRAMIDE HCL 5 MG PO TABS: 5.0000 mg | ORAL_TABLET | Freq: Three times a day (TID) | ORAL | Status: DC | PRN

## 2017-03-09 SURGICAL SUPPLY — 58 items
ADH SKN CLS APL DERMABOND .7 (GAUZE/BANDAGES/DRESSINGS) ×2
ALCOHOL ISOPROPYL (RUBBING) (MISCELLANEOUS) ×2 IMPLANT
BLADE CLIPPER SURG (BLADE) IMPLANT
BLADE SURG 10 STRL SS (BLADE) ×2 IMPLANT
CAPT HIP TOTAL 2 ×1 IMPLANT
CHLORAPREP W/TINT 26ML (MISCELLANEOUS) ×2 IMPLANT
COVER SURGICAL LIGHT HANDLE (MISCELLANEOUS) ×2 IMPLANT
DERMABOND ADVANCED (GAUZE/BANDAGES/DRESSINGS) ×2
DERMABOND ADVANCED .7 DNX12 (GAUZE/BANDAGES/DRESSINGS) ×2 IMPLANT
DRAPE C-ARM 42X72 X-RAY (DRAPES) ×2 IMPLANT
DRAPE STERI IOBAN 125X83 (DRAPES) ×2 IMPLANT
DRAPE U-SHAPE 47X51 STRL (DRAPES) ×6 IMPLANT
DRSG AQUACEL AG ADV 3.5X10 (GAUZE/BANDAGES/DRESSINGS) ×2 IMPLANT
ELECT BLADE 4.0 EZ CLEAN MEGAD (MISCELLANEOUS) ×2
ELECT REM PT RETURN 9FT ADLT (ELECTROSURGICAL) ×2
ELECTRODE BLDE 4.0 EZ CLN MEGD (MISCELLANEOUS) ×1 IMPLANT
ELECTRODE REM PT RTRN 9FT ADLT (ELECTROSURGICAL) ×1 IMPLANT
EVACUATOR 1/8 PVC DRAIN (DRAIN) IMPLANT
GLOVE BIO SURGEON STRL SZ8.5 (GLOVE) ×4 IMPLANT
GLOVE BIOGEL PI IND STRL 8.5 (GLOVE) ×2 IMPLANT
GLOVE BIOGEL PI INDICATOR 8.5 (GLOVE) ×2
GLOVE ECLIPSE 8.0 STRL XLNG CF (GLOVE) ×2 IMPLANT
GLOVE SURG SS PI 8.0 STRL IVOR (GLOVE) ×4 IMPLANT
GOWN STRL REUS W/ TWL LRG LVL3 (GOWN DISPOSABLE) ×2 IMPLANT
GOWN STRL REUS W/ TWL XL LVL3 (GOWN DISPOSABLE) IMPLANT
GOWN STRL REUS W/TWL 2XL LVL3 (GOWN DISPOSABLE) ×2 IMPLANT
GOWN STRL REUS W/TWL LRG LVL3 (GOWN DISPOSABLE) ×4
GOWN STRL REUS W/TWL XL LVL3 (GOWN DISPOSABLE) ×4
HANDPIECE INTERPULSE COAX TIP (DISPOSABLE) ×2
HOOD PEEL AWAY FACE SHEILD DIS (HOOD) ×4 IMPLANT
KIT BASIN OR (CUSTOM PROCEDURE TRAY) ×2 IMPLANT
KIT ROOM TURNOVER OR (KITS) ×2 IMPLANT
MANIFOLD NEPTUNE II (INSTRUMENTS) ×2 IMPLANT
MARKER SKIN DUAL TIP RULER LAB (MISCELLANEOUS) ×4 IMPLANT
NDL SPNL 18GX3.5 QUINCKE PK (NEEDLE) ×1 IMPLANT
NEEDLE SPNL 18GX3.5 QUINCKE PK (NEEDLE) ×2 IMPLANT
NS IRRIG 1000ML POUR BTL (IV SOLUTION) ×2 IMPLANT
PACK TOTAL JOINT (CUSTOM PROCEDURE TRAY) ×2 IMPLANT
PACK UNIVERSAL I (CUSTOM PROCEDURE TRAY) ×2 IMPLANT
PAD ARMBOARD 7.5X6 YLW CONV (MISCELLANEOUS) ×4 IMPLANT
SAW OSC TIP CART 19.5X105X1.3 (SAW) ×2 IMPLANT
SEALER BIPOLAR AQUA 6.0 (INSTRUMENTS) IMPLANT
SET HNDPC FAN SPRY TIP SCT (DISPOSABLE) ×1 IMPLANT
SOL PREP POV-IOD 4OZ 10% (MISCELLANEOUS) ×2 IMPLANT
SUT ETHIBOND NAB CT1 #1 30IN (SUTURE) ×4 IMPLANT
SUT MNCRL AB 3-0 PS2 18 (SUTURE) ×2 IMPLANT
SUT MON AB 2-0 CT1 36 (SUTURE) ×2 IMPLANT
SUT VIC AB 1 CT1 27 (SUTURE) ×2
SUT VIC AB 1 CT1 27XBRD ANBCTR (SUTURE) ×1 IMPLANT
SUT VIC AB 2-0 CT1 27 (SUTURE) ×2
SUT VIC AB 2-0 CT1 TAPERPNT 27 (SUTURE) ×1 IMPLANT
SUT VLOC 180 0 24IN GS25 (SUTURE) ×2 IMPLANT
SYR 50ML LL SCALE MARK (SYRINGE) ×2 IMPLANT
TOWEL OR 17X24 6PK STRL BLUE (TOWEL DISPOSABLE) ×2 IMPLANT
TOWEL OR 17X26 10 PK STRL BLUE (TOWEL DISPOSABLE) ×2 IMPLANT
TRAY CATH 16FR W/PLASTIC CATH (SET/KITS/TRAYS/PACK) IMPLANT
TRAY FOLEY CATH SILVER 16FR (SET/KITS/TRAYS/PACK) ×2 IMPLANT
WATER STERILE IRR 1000ML POUR (IV SOLUTION) ×4 IMPLANT

## 2017-03-09 NOTE — Op Note (Signed)
OPERATIVE REPORT  SURGEON: Rod Can, MD   ASSISTANT: Ky Barban, RNFA.  PREOPERATIVE DIAGNOSIS: Left hip avascular necrosis.   POSTOPERATIVE DIAGNOSIS: Left hip avascular necrosis.   PROCEDURE: Left total hip arthroplasty, anterior approach.   IMPLANTS: DePuy Tri Lock stem, size 9, hi offset. DePuy Pinnacle Cup, size 56 mm. DePuy Altrx liner, size 36 by 56 mm, neutral. DePuy Biolox ceramic head ball, size 36 + 1.5 mm.  ANESTHESIA:  General  ESTIMATED BLOOD LOSS: 550 mL.  ANTIBIOTICS: 2 g Ancef.  DRAINS: None.  COMPLICATIONS: None.   CONDITION: PACU - hemodynamically stable.   BRIEF CLINICAL NOTE: Austin Oliver is a 61 y.o. male with a long-standing history of Left hip avascular necrosis with collapse. After failing conservative management, the patient was indicated for total hip arthroplasty. The risks, benefits, and alternatives to the procedure were explained, and the patient elected to proceed.  PROCEDURE IN DETAIL: Surgical site was marked by myself in the pre-op holding area. Once inside the operating room, general anesthesia was obtained, and a foley catheter was inserted. The patient was then positioned on the Hana table. All bony prominences were well padded. The hip was prepped and draped in the normal sterile surgical fashion. A time-out was called verifying side and site of surgery. The patient received IV antibiotics within 60 minutes of beginning the procedure.  The direct anterior approach to the hip was performed through the Hueter interval. Lateral femoral circumflex vessels were treated with the Auqumantys. The anterior capsule was exposed and an inverted T capsulotomy was made.The femoral neck cut was made to the level of the templated cut. A corkscrew was placed into the head and the head was removed. The femoral head was found to have delaminated cartilage. The head was passed to the back table and was measured.  Acetabular exposure was  achieved, and the pulvinar and labrum were excised. Sequential reaming of the acetabulum was then performed up to a size 55 mm reamer. A 56 mm cup was then opened and impacted into place at approximately 40 degrees of abduction and 20 degrees of anteversion. The final polyethylene liner was impacted into place and acetabular osteophytes were removed.   I then gained femoral exposure taking care to protect the abductors and greater trochanter. This was performed using standard external rotation, extension, and adduction. The capsule was peeled off the inner aspect of the greater trochanter, taking care to preserve the short external rotators. A cookie cutter was used to enter the femoral canal, and then the femoral canal finder was placed. Sequential broaching was performed up to a size 9. Calcar planer was used on the femoral neck remnant. I placed a hi offset neck and a trial head ball. The hip was reduced. Leg lengths and offset were checked fluoroscopically. The hip was dislocated and trial components were removed. The final implants were placed, and the hip was reduced.  Fluoroscopy was used to confirm component position and leg lengths. At 90 degrees of external rotation and full extension, the hip was stable to an anterior directed force.  The wound was copiously irrigated with normal saline using pulse lavage. Marcaine solution was injected into the periarticular soft tissue. The wound was closed in layers using #1 Vicryl and V-Loc for the fascia, 2-0 Vicryl for the subcutaneous fat, 2-0 Monocryl for the deep dermal layer, 3-0 running Monocryl subcuticular stitch, and Dermabond for the skin. Once the glue was fully dried, an Aquacell Ag dressing was applied. The patient was transported  to the recovery room in stable condition. Sponge, needle, and instrument counts were correct at the end of the case x2. The patient tolerated the procedure well and there were no known complications.

## 2017-03-09 NOTE — Discharge Instructions (Signed)
°Dr. Athan Casalino °Joint Replacement Specialist °[facility] °[address] °[place], [place] [address] °[phone] ° ° °TOTAL HIP REPLACEMENT POSTOPERATIVE DIRECTIONS ° ° ° °Hip Rehabilitation, Guidelines Following Surgery  ° °WEIGHT BEARING °Weight bearing as tolerated with assist device (walker, cane, etc) as directed, use it as long as suggested by your surgeon or therapist, typically at least 4-6 weeks. ° °The results of a hip operation are greatly improved after range of motion and muscle strengthening exercises. Follow all safety measures which are given to protect your hip. If any of these exercises cause increased pain or swelling in your joint, decrease the amount until you are comfortable again. Then slowly increase the exercises. Call your caregiver if you have problems or questions.  ° °HOME CARE INSTRUCTIONS  °Most of the following instructions are designed to prevent the dislocation of your new hip.  °Remove items at home which could result in a fall. This includes throw rugs or furniture in walking pathways.  °Continue medications as instructed at time of discharge. °· You may have some home medications which will be placed on hold until you complete the course of blood thinner medication. °· You may start showering once you are discharged home. Do not remove your dressing. °Do not put on socks or shoes without following the instructions of your caregivers.   °Sit on chairs with arms. Use the chair arms to help push yourself up when arising.  °Arrange for the use of a toilet seat elevator so you are not sitting low.  °· Walk with walker as instructed.  °You may resume a sexual relationship in one month or when given the OK by your caregiver.  °Use walker as long as suggested by your caregivers.  °You may put full weight on your legs and walk as much as is comfortable. °Avoid periods of inactivity such as sitting longer than an hour when not asleep. This helps prevent  blood clots.  °You may return to work once you are cleared by your surgeon.  °Do not drive a car for 6 weeks or until released by your surgeon.  °Do not drive while taking narcotics.  °Wear elastic stockings for two weeks following surgery during the day but you may remove then at night.  °Make sure you keep all of your appointments after your operation with all of your doctors and caregivers. You should call the office at the above phone number and make an appointment for approximately two weeks after the date of your surgery. °Please pick up a stool softener and laxative for home use as long as you are requiring pain medications. °· ICE to the affected hip every three hours for 30 minutes at a time and then as needed for pain and swelling. Continue to use ice on the hip for pain and swelling from surgery. You may notice swelling that will progress down to the foot and ankle.  This is normal after surgery.  Elevate the leg when you are not up walking on it.   °It is important for you to complete the blood thinner medication as prescribed by your doctor. °· Continue to use the breathing machine which will help keep your temperature down.  It is common for your temperature to cycle up and down following surgery, especially at night when you are not up moving around and exerting yourself.  The breathing machine keeps your lungs expanded and your temperature down. ° °RANGE OF MOTION AND STRENGTHENING EXERCISES  °These exercises are   designed to help you keep full movement of your hip joint. Follow your caregiver's or physical therapist's instructions. Perform all exercises about fifteen times, three times per day or as directed. Exercise both hips, even if you have had only one joint replacement. These exercises can be done on a training (exercise) mat, on the floor, on a table or on a bed. Use whatever works the best and is most comfortable for you. Use music or television while you are exercising so that the exercises  are a pleasant break in your day. This will make your life better with the exercises acting as a break in routine you can look forward to.  °Lying on your back, slowly slide your foot toward your buttocks, raising your knee up off the floor. Then slowly slide your foot back down until your leg is straight again.  °Lying on your back spread your legs as far apart as you can without causing discomfort.  °Lying on your side, raise your upper leg and foot straight up from the floor as far as is comfortable. Slowly lower the leg and repeat.  °Lying on your back, tighten up the muscle in the front of your thigh (quadriceps muscles). You can do this by keeping your leg straight and trying to raise your heel off the floor. This helps strengthen the largest muscle supporting your knee.  °Lying on your back, tighten up the muscles of your buttocks both with the legs straight and with the knee bent at a comfortable angle while keeping your heel on the floor.  ° °SKILLED REHAB INSTRUCTIONS: °If the patient is transferred to a skilled rehab facility following release from the hospital, a list of the current medications will be sent to the facility for the patient to continue.  When discharged from the skilled rehab facility, please have the facility set up the patient's Home Health Physical Therapy prior to being released. Also, the skilled facility will be responsible for providing the patient with their medications at time of release from the facility to include their pain medication and their blood thinner medication. If the patient is still at the rehab facility at time of the two week follow up appointment, the skilled rehab facility will also need to assist the patient in arranging follow up appointment in our office and any transportation needs. ° °MAKE SURE YOU:  °Understand these instructions.  °Will watch your condition.  °Will get help right away if you are not doing well or get worse. ° °Pick up stool softner and  laxative for home use following surgery while on pain medications. °Do not remove your dressing. °The dressing is waterproof--it is OK to take showers. °Continue to use ice for pain and swelling after surgery. °Do not use any lotions or creams on the incision until instructed by your surgeon. °Total Hip Protocol. ° ° °

## 2017-03-09 NOTE — Interval H&P Note (Signed)
History and Physical Interval Note:  03/09/2017 9:28 AM  Austin Oliver  has presented today for surgery, with the diagnosis of AVN Left hip  The various methods of treatment have been discussed with the patient and family. After consideration of risks, benefits and other options for treatment, the patient has consented to  Procedure(s) with comments: LEFT TOTAL HIP ARTHROPLASTY ANTERIOR APPROACH (Left) - Dr. requesting RNFA as a surgical intervention .  The patient's history has been reviewed, patient examined, no change in status, stable for surgery.  I have reviewed the patient's chart and labs.  Questions were answered to the patient's satisfaction.     Austin Oliver, Austin Oliver

## 2017-03-09 NOTE — Transfer of Care (Signed)
Immediate Anesthesia Transfer of Care Note  Patient: Austin Oliver  Procedure(s) Performed: Procedure(s) with comments: LEFT TOTAL HIP ARTHROPLASTY ANTERIOR APPROACH (Left) - Dr. requesting RNFA  Patient Location: PACU  Anesthesia Type:General  Level of Consciousness: awake, sedated, patient cooperative and responds to stimulation  Airway & Oxygen Therapy: Patient Spontanous Breathing and Patient connected to nasal cannula oxygen  Post-op Assessment: Report given to RN, Post -op Vital signs reviewed and stable, Patient moving all extremities and Patient moving all extremities X 4  Post vital signs: Reviewed and stable  Last Vitals:  Vitals:   03/09/17 0857  BP: (!) 153/78  Pulse: (!) 45  Resp: 18  Temp: 36.6 C    Last Pain:  Vitals:   03/09/17 0857  TempSrc: Oral      Patients Stated Pain Goal: 5 (95/09/32 6712)  Complications: No apparent anesthesia complications

## 2017-03-09 NOTE — Anesthesia Procedure Notes (Addendum)
Procedure Name: Intubation Date/Time: 03/09/2017 11:01 AM Performed by: Jacquiline Doe A Pre-anesthesia Checklist: Patient identified, Emergency Drugs available, Suction available and Patient being monitored Patient Re-evaluated:Patient Re-evaluated prior to inductionOxygen Delivery Method: Circle System Utilized and Circle system utilized Preoxygenation: Pre-oxygenation with 100% oxygen Intubation Type: IV induction and Cricoid Pressure applied Ventilation: Mask ventilation without difficulty Laryngoscope Size: Mac and 4 Grade View: Grade I Tube type: Oral Tube size: 7.5 mm Number of attempts: 1 Airway Equipment and Method: Stylet Placement Confirmation: ETT inserted through vocal cords under direct vision,  positive ETCO2 and breath sounds checked- equal and bilateral Secured at: 23 cm Tube secured with: Tape Dental Injury: Teeth and Oropharynx as per pre-operative assessment

## 2017-03-09 NOTE — Anesthesia Preprocedure Evaluation (Signed)
Anesthesia Evaluation  Patient identified by MRN, date of birth, ID band Patient awake    Airway Mallampati: I   Neck ROM: Full    Dental no notable dental hx.    Pulmonary former smoker,    breath sounds clear to auscultation       Cardiovascular + CAD   Rhythm:Regular Rate:Normal     Neuro/Psych    GI/Hepatic GERD  ,  Endo/Other  Hypothyroidism   Renal/GU      Musculoskeletal  (+) Arthritis ,   Abdominal (+) + obese,   Peds  Hematology   Anesthesia Other Findings   Reproductive/Obstetrics                             Anesthesia Physical Anesthesia Plan  ASA: III  Anesthesia Plan: General   Post-op Pain Management:    Induction: Intravenous  Airway Management Planned: Oral ETT  Additional Equipment:   Intra-op Plan:   Post-operative Plan: Extubation in OR  Informed Consent: I have reviewed the patients History and Physical, chart, labs and discussed the procedure including the risks, benefits and alternatives for the proposed anesthesia with the patient or authorized representative who has indicated his/her understanding and acceptance.     Plan Discussed with: CRNA  Anesthesia Plan Comments:         Anesthesia Quick Evaluation

## 2017-03-09 NOTE — H&P (View-Only) (Signed)
TOTAL HIP ADMISSION H&P  Patient is admitted for left total hip arthroplasty.  Subjective:  Chief Complaint: left hip pain  HPI: Austin Oliver, 61 y.o. 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  Onset of symptoms was gradual starting 2 years ago with rapidlly worsening course since that time.The patient noted no past surgery on the left hip(s).  Patient currently rates pain in the left hip at 10 out of 10 with activity. Patient has night pain, worsening of pain with activity and weight bearing, pain that interfers with activities of daily living, pain with passive range of motion and crepitus. Patient has evidence of joint space narrowing and AVN with collapse by imaging studies. This condition presents safety issues increasing the risk of falls. This patient has had avascular necrosis of the hip, acetabular fracture, hip dysplasia.  There is no current active infection.  Patient Active Problem List   Diagnosis Date Noted  . Hyperlipidemia 01/14/2017  . Sinus bradycardia 01/14/2017  . Coronary artery calcification seen on CAT scan 01/14/2017   Past Medical History:  Diagnosis Date  . Bradycardia   . Coronary artery calcification seen on CAT scan 01/14/2017  . Family history of thyroid problem   . GERD (gastroesophageal reflux disease)   . Hyperlipidemia   . Osteoarthritis   . Thyroid disease     Past Surgical History:  Procedure Laterality Date  . HEEL SPUR SURGERY    . KNEE SURGERY Bilateral   . NECK SURGERY       (Not in a hospital admission) Allergies  Allergen Reactions  . Clindamycin/Lincomycin     Social History  Substance Use Topics  . Smoking status: Former Smoker    Quit date:  12/05/2016  . Smokeless tobacco: Never Used  . Alcohol use No    Family History  Problem Relation Age of Onset  . Dementia Mother   . Heart Problems Father   . COPD Father   . Lung cancer Father      Review of Systems  Constitutional: Negative.   HENT: Negative.   Eyes: Negative.   Respiratory: Negative.   Cardiovascular: Negative.   Gastrointestinal: Negative.   Genitourinary: Negative.   Musculoskeletal: Positive for joint pain.  Skin: Negative.   Neurological: Negative.   Endo/Heme/Allergies: Negative.   Psychiatric/Behavioral: Negative.     Objective:  Physical Exam  Vitals reviewed. Constitutional: He is oriented to person, place, and time. He appears well-developed and well-nourished.  HENT:  Head: Normocephalic and atraumatic.  Eyes: Conjunctivae and EOM are normal. Pupils are equal, round, and reactive to light.  Neck: Normal range of motion. Neck supple.  Cardiovascular: Normal rate, regular rhythm and intact distal pulses.   Respiratory: Effort normal. No respiratory distress.  GI: Soft. He exhibits no distension.  Genitourinary:  Genitourinary Comments: deferred  Musculoskeletal:       Left hip: He exhibits decreased range of motion, decreased strength and bony tenderness.  Neurological: He is alert and oriented to person, place, and time. He has normal reflexes.  Skin: Skin is warm.  Psychiatric: He has a normal mood and affect. His behavior is normal. Judgment and thought content normal.    Vital signs in last 24 hours: @VSRANGES @  Labs:   Estimated body mass  index is 34.85 kg/m as calculated from the following:   Height as of 01/16/17: 5\' 9"  (1.753 m).   Weight as of 01/16/17: 107 kg (236 lb).   Imaging Review Plain radiographs demonstrate severe degenerative joint disease of the left hip(s). The bone quality appears to be adequate for age and reported activity level.  Assessment/Plan:  End stage arthritis, left hip(s)  The patient history,  physical examination, clinical judgement of the provider and imaging studies are consistent with end stage degenerative joint disease of the left hip(s) and total hip arthroplasty is deemed medically necessary. The treatment options including medical management, injection therapy, arthroscopy and arthroplasty were discussed at length. The risks and benefits of total hip arthroplasty were presented and reviewed. The risks due to aseptic loosening, infection, stiffness, dislocation/subluxation,  thromboembolic complications and other imponderables were discussed.  The patient acknowledged the explanation, agreed to proceed with the plan and consent was signed. Patient is being admitted for inpatient treatment for surgery, pain control, PT, OT, prophylactic antibiotics, VTE prophylaxis, progressive ambulation and ADL's and discharge planning.The patient is planning to be discharged home with HEP.

## 2017-03-10 ENCOUNTER — Encounter (HOSPITAL_COMMUNITY): Payer: Self-pay | Admitting: Orthopedic Surgery

## 2017-03-10 LAB — BASIC METABOLIC PANEL
Anion gap: 9 (ref 5–15)
Anion gap: 9 (ref 5–15)
BUN: 19 mg/dL (ref 6–20)
BUN: 22 mg/dL — ABNORMAL HIGH (ref 6–20)
CO2: 24 mmol/L (ref 22–32)
CO2: 22 mmol/L (ref 22–32)
Calcium: 8.9 mg/dL (ref 8.9–10.3)
Calcium: 8.8 mg/dL — ABNORMAL LOW (ref 8.9–10.3)
Chloride: 107 mmol/L (ref 101–111)
Chloride: 107 mmol/L (ref 101–111)
Creatinine, Ser: 1.34 mg/dL — ABNORMAL HIGH (ref 0.61–1.24)
Creatinine, Ser: 1.36 mg/dL — ABNORMAL HIGH (ref 0.61–1.24)
GFR calc Af Amer: >60 mL/min (ref >60)
GFR calc Af Amer: >60 mL/min (ref >60)
GFR calc non Af Amer: 55 mL/min — ABNORMAL LOW (ref >60)
GFR calc non Af Amer: 54 mL/min — ABNORMAL LOW (ref >60)
Glucose, Bld: 141 mg/dL — ABNORMAL HIGH (ref 65–99)
Glucose, Bld: 216 mg/dL — ABNORMAL HIGH (ref 65–99)
Potassium: 4.6 mmol/L (ref 3.5–5.1)
Potassium: 4.7 mmol/L (ref 3.5–5.1)
Sodium: 140 mmol/L (ref 135–145)
Sodium: 138 mmol/L (ref 135–145)

## 2017-03-10 LAB — CBC
HCT: 30.5 % — ABNORMAL LOW (ref 39.0–52.0)
Hemoglobin: 10 g/dL — ABNORMAL LOW (ref 13.0–17.0)
MCH: 30.6 pg (ref 26.0–34.0)
MCHC: 32.8 g/dL (ref 30.0–36.0)
MCV: 93.3 fL (ref 78.0–100.0)
Platelets: 105 10*3/uL — ABNORMAL LOW (ref 150–400)
RBC: 3.27 MIL/uL — ABNORMAL LOW (ref 4.22–5.81)
RDW: 12.8 % (ref 11.5–15.5)
WBC: 9.2 10*3/uL (ref 4.0–10.5)

## 2017-03-10 MED ORDER — METHOCARBAMOL 500 MG PO TABS: 500.0000 mg | ORAL_TABLET | Freq: Four times a day (QID) | ORAL | 0 refills | Status: DC | PRN

## 2017-03-10 MED ORDER — DOCUSATE SODIUM 100 MG PO CAPS: 100.0000 mg | ORAL_CAPSULE | Freq: Two times a day (BID) | ORAL | 0 refills | Status: DC

## 2017-03-10 MED ORDER — ONDANSETRON HCL 4 MG PO TABS: 4.0000 mg | ORAL_TABLET | Freq: Four times a day (QID) | ORAL | 0 refills | Status: DC | PRN

## 2017-03-10 MED ORDER — ASPIRIN 81 MG PO CHEW: 81.0000 mg | CHEWABLE_TABLET | Freq: Two times a day (BID) | ORAL | 1 refills | Status: DC

## 2017-03-10 MED ORDER — SENNA 8.6 MG PO TABS: 2.0000 | ORAL_TABLET | Freq: Every day | ORAL | 0 refills | Status: DC

## 2017-03-10 MED ORDER — SODIUM CHLORIDE 0.9 % IV SOLN
INTRAVENOUS | Status: DC
  Administered 2017-03-10: 16:00:00 via INTRAVENOUS

## 2017-03-10 MED ORDER — HYDROCODONE-ACETAMINOPHEN 5-325 MG PO TABS: 1.0000 | ORAL_TABLET | ORAL | 0 refills | Status: DC | PRN

## 2017-03-10 MED ORDER — SODIUM CHLORIDE 0.9 % IV BOLUS (SEPSIS)
500.0000 mL | Freq: Once | INTRAVENOUS | Status: AC
  Administered 2017-03-10: 500 mL via INTRAVENOUS

## 2017-03-10 NOTE — Discharge Summary (Signed)
Physician Discharge Summary  Patient ID: Austin Oliver MRN: [id number] DOB/AGE: Mar 26, 1956 61 y.o.  Admit date: 03/09/2017 Discharge date: 03/10/2017  Admission Diagnoses:  Avascular necrosis of hip, left [facility])  Discharge Diagnoses:  Principal Problem:   Avascular necrosis of hip, left [facility])   Past Medical History:  Diagnosis Date  . Bradycardia   . Coronary artery calcification seen on CAT scan 01/14/2017  . Family history of thyroid problem   . GERD (gastroesophageal reflux disease)   . History of kidney stones   . Hyperlipidemia   . Hypothyroidism   . Osteoarthritis   . Shingles 2012  . Thyroid disease     Surgeries: Procedure(s): LEFT TOTAL HIP ARTHROPLASTY ANTERIOR APPROACH on 03/09/2017   Consultants (if any):   Discharged Condition: Improved  Hospital Course: Austin Oliver is an 61 y.o. male who was admitted 03/09/2017 with a diagnosis of Avascular necrosis of hip, left ([place]) and went to the operating room on 03/09/2017 and underwent the above named procedures.    He was given perioperative antibiotics:  Anti-infectives    Start     Dose/Rate Route Frequency Ordered Stop   03/09/17 1815  valACYclovir (VALTREX) tablet 500 mg     500 mg Oral Daily 03/09/17 1810     03/09/17 1815  ceFAZolin (ANCEF) IVPB 2g/100 mL premix     2 g 200 mL/hr over 30 Minutes Intravenous Every 6 hours 03/09/17 1810 03/10/17 0229   03/09/17 0914  ceFAZolin (ANCEF) IVPB 2g/100 mL premix     2 g 200 mL/hr over 30 Minutes Intravenous On call to O.R. 03/09/17 6644 03/09/17 1115    .  He was given sequential compression devices, early ambulation, and ASA for DVT prophylaxis.  Patient had a mild elevation in his creatinine on postop day #1. He was given IV fluids and encouraged to increase his oral fluid intake. Creatinine was at baseline the next morning.  He benefited maximally from the hospital stay and there were no complications.    Recent vital signs:  Vitals:   03/10/17 0100  03/10/17 0516  BP: (!) 126/58 (!) 109/56  Pulse: (!) 50 (!) 53  Resp: 17 17  Temp: 98.4 F (36.9 C) 98.6 F (37 C)    Recent laboratory studies:  Lab Results  Component Value Date   HGB 10.0 (L) 03/10/2017   HGB 13.6 03/03/2017   Lab Results  Component Value Date   WBC 9.2 03/10/2017   PLT 105 (L) 03/10/2017   No results found for: INR Lab Results  Component Value Date   NA 140 03/10/2017   K 4.6 03/10/2017   CL 107 03/10/2017   CO2 24 03/10/2017   BUN 19 03/10/2017   CREATININE 1.34 (H) 03/10/2017   GLUCOSE 141 (H) 03/10/2017    Discharge Medications:   Allergies as of 03/10/2017      Reactions   Bee Venom Swelling   Extreme Swelling of site     Cleocin [clindamycin Hcl] Rash   RASH IN BETWEEN FINGERS      Medication List    STOP taking these medications   aspirin EC 81 MG tablet Replaced by:  aspirin 81 MG chewable tablet   traMADol 50 MG tablet Commonly known as:  ULTRAM     TAKE these medications   ALAWAY 0.025 % ophthalmic solution Generic drug:  ketotifen Place 1 drop into both eyes 2 (two) times daily as needed (for allergy eyes).   aspirin 81 MG chewable tablet Chew  1 tablet (81 mg total) by mouth 2 (two) times daily. Replaces:  aspirin EC 81 MG tablet   Co Q-10 100 MG Caps Take 100 mg by mouth daily with supper. Ultra Co q10   docusate sodium 100 MG capsule Commonly known as:  COLACE Take 1 capsule (100 mg total) by mouth 2 (two) times daily.   FISH OIL PO Take 1,300 mg by mouth 2 (two) times daily.   HYDROcodone-acetaminophen 5-325 MG tablet Commonly known as:  NORCO/VICODIN Take 1-2 tablets by mouth every 4 (four) hours as needed (breakthrough pain).   levothyroxine 75 MCG tablet Commonly known as:  SYNTHROID, LEVOTHROID Take 75 mcg by mouth daily before breakfast.   loteprednol 0.5 % ophthalmic suspension Commonly known as:  LOTEMAX Place 1 drop into the left eye every other day.   lovastatin 20 MG tablet Commonly known as:   MEVACOR Take 20 mg by mouth every other day. With Supper   methocarbamol 500 MG tablet Commonly known as:  ROBAXIN Take 1 tablet (500 mg total) by mouth every 6 (six) hours as needed for muscle spasms.   ondansetron 4 MG tablet Commonly known as:  ZOFRAN Take 1 tablet (4 mg total) by mouth every 6 (six) hours as needed for nausea.   senna 8.6 MG Tabs tablet Commonly known as:  SENOKOT Take 2 tablets (17.2 mg total) by mouth at bedtime.   terazosin 5 MG capsule Commonly known as:  HYTRIN Take 5 mg by mouth at bedtime.   trifluridine 1 % ophthalmic solution Commonly known as:  VIROPTIC Place 1 drop into the left eye every other day.   valACYclovir 500 MG tablet Commonly known as:  VALTREX Take 500 mg by mouth daily.            Durable Medical Equipment        Start     Ordered   03/09/17 1811  DME Walker rolling  Once    Question:  Patient needs a walker to treat with the following condition  Answer:  Status post total hip replacement, left   03/09/17 1810   03/09/17 1811  DME 3 n 1  Once     03/09/17 1810      Diagnostic Studies: Dg Pelvis Portable  Result Date: 03/09/2017 CLINICAL DATA:  Status post left hip replacement EXAM: PORTABLE PELVIS 1-2 VIEWS COMPARISON:  03/09/2017 FINDINGS: Left hip replacement is now seen. No acute bony abnormality is noted. No soft tissue changes are seen. IMPRESSION: Left hip prosthesis. Electronically Signed   By: Inez Catalina M.D.   On: 03/09/2017 14:20   Dg C-arm 61-120 Min  Result Date: 03/09/2017 CLINICAL DATA:  Left hip surgery. EXAM: OPERATIVE LEFT HIP (WITH PELVIS IF PERFORMED) 2 VIEWS TECHNIQUE: Fluoroscopic spot image(s) were submitted for interpretation post-operatively. COMPARISON:  CT 04/25/2008. FINDINGS: Total left hip replacement. Hardware intact. Anatomic alignment. No focal abnormality identified. No evidence of dislocation. IMPRESSION: Total left hip replacement with good anatomic alignment. Electronically Signed    By: Marcello Moores  Register   On: 03/09/2017 12:55   Dg Hip Operative Unilat W Or W/o Pelvis Left  Result Date: 03/09/2017 CLINICAL DATA:  Left hip surgery. EXAM: OPERATIVE LEFT HIP (WITH PELVIS IF PERFORMED) 2 VIEWS TECHNIQUE: Fluoroscopic spot image(s) were submitted for interpretation post-operatively. COMPARISON:  CT 04/25/2008. FINDINGS: Total left hip replacement. Hardware intact. Anatomic alignment. No focal abnormality identified. No evidence of dislocation. IMPRESSION: Total left hip replacement with good anatomic alignment. Electronically Signed   By: Marcello Moores  Register   On: 03/09/2017 12:55    Disposition: Final discharge disposition not confirmed  Discharge Instructions    Call MD / Call 911    Complete by:  As directed    If you experience chest pain or shortness of breath, CALL 911 and be transported to the hospital emergency room.  If you develope a fever above 101 F, pus (white drainage) or increased drainage or redness at the wound, or calf pain, call your surgeon's office.   Constipation Prevention    Complete by:  As directed    Drink plenty of fluids.  Prune juice may be helpful.  You may use a stool softener, such as Colace (over the counter) 100 mg twice a day.  Use MiraLax (over the counter) for constipation as needed.   Diet - low sodium heart healthy    Complete by:  As directed    Driving restrictions    Complete by:  As directed    No driving for 6 weeks   Increase activity slowly as tolerated    Complete by:  As directed    Lifting restrictions    Complete by:  As directed    No lifting for 6 weeks   TED hose    Complete by:  As directed    Use stockings (TED hose) for 2 weeks on both leg(s).  You may remove them at night for sleeping.      Follow-up Information    Lawanna Cecere, Horald Pollen, MD. Schedule an appointment as soon as possible for a visit in 2 weeks.   Specialty:  Orthopedic Surgery Why:  For wound re-check Contact information: [address] [address] [phone]            Signed: Elie Goody 03/10/2017, 8:12 AM

## 2017-03-10 NOTE — Progress Notes (Signed)
   Subjective:  Patient reports pain as mild to moderate.  Denies N/V/CP/SOB.  Objective:   VITALS:   Vitals:   03/09/17 1800 03/09/17 1946 03/10/17 0100 03/10/17 0516  BP: 133/77 112/61 (!) 126/58 (!) 109/56  Pulse: (!) 52 68 (!) 50 (!) 53  Resp:  15 17 17   Temp: 98.6 F (37 C) 98.6 F (37 C) 98.4 F (36.9 C) 98.6 F (37 C)  TempSrc: Oral Oral Oral Oral  SpO2: 98% 97% 97% 97%  Weight:      Height:        NAD ABD soft Sensation intact distally Intact pulses distally Dorsiflexion/Plantar flexion intact Incision: dressing C/D/I Compartment soft   Lab Results  Component Value Date   WBC 9.2 03/10/2017   HGB 10.0 (L) 03/10/2017   HCT 30.5 (L) 03/10/2017   MCV 93.3 03/10/2017   PLT 105 (L) 03/10/2017   BMET    Component Value Date/Time   NA 140 03/10/2017 0519   K 4.6 03/10/2017 0519   CL 107 03/10/2017 0519   CO2 24 03/10/2017 0519   GLUCOSE 141 (H) 03/10/2017 0519   BUN 19 03/10/2017 0519   CREATININE 1.34 (H) 03/10/2017 0519   CALCIUM 8.9 03/10/2017 0519   GFRNONAA 55 (L) 03/10/2017 0519   GFRAA >60 03/10/2017 0519     Assessment/Plan: 1 Day Post-Op   Principal Problem:   Avascular necrosis of hip, left ([facility])   WBAT with walker DVT ppx: ASA, SCDs, TEDs PO pain control PT Mild elevation in CR, encourage PO fluids, recheck at 1300 Dispo: D/C home after clears therapy and pending 1300 BMP   Adyn Hoes, Horald Pollen 03/10/2017, 8:08 AM   Rod Can, MD Cell [phone]

## 2017-03-10 NOTE — Progress Notes (Signed)
RN notified Dr Lyla Glassing of patient's repeat creatinine result. He provided verbal orders to resume fluids of normal saline at 75 mL/hour and recheck BMP in AM. RN will placed verbal orders and notify patient.

## 2017-03-10 NOTE — Progress Notes (Signed)
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  PMH positive for GERD, hyperlipidemia and OA.    PT Comments    Patient continues to progress with mobility. Overall mod I/supervision for mobility and able to complete full HEP and reviewed handout this session. Wife present. Current plan remains appropriate.    Follow Up Recommendations  Home health PT     Equipment Recommendations  None recommended by PT    Recommendations for Other Services       Precautions / Restrictions Precautions Precautions: Fall Restrictions Weight Bearing Restrictions: Yes LLE Weight Bearing: Weight bearing as tolerated    Mobility  Bed Mobility Overal bed mobility: Modified Independent             General bed mobility comments: increased time/effort  Transfers Overall transfer level: Needs assistance Equipment used: Rolling walker (2 wheeled) Transfers: Sit to/from Stand Sit to Stand: Supervision         General transfer comment: supervision for safety  Ambulation/Gait Ambulation/Gait assistance: Supervision Ambulation Distance (Feet): 4 Feet Assistive device: Rolling walker (2 wheeled) Gait Pattern/deviations: Step-through pattern     General Gait Details: pt with carry over of sequencing and safe use of AD   Stairs            Wheelchair Mobility    Modified Rankin (Stroke Patients Only)       Balance     Sitting balance-Leahy Scale: Good       Standing balance-Leahy Scale: Fair                              Cognition Arousal/Alertness: Awake/alert Behavior During Therapy: WFL for tasks assessed/performed Overall Cognitive Status: Within Functional Limits for tasks assessed                                        Exercises Total Joint  Exercises Quad Sets: AROM;Left;10 reps Short Arc Quad: AROM;Left;10 reps Heel Slides: AROM;Left;10 reps Hip ABduction/ADduction: AROM;Left;Standing;Supine (10 standing and 10 supine) Long Arc Quad: AROM;Left;10 reps;Seated Knee Flexion: AROM;Left;10 reps;Standing Marching in Standing: AROM;Left;10 reps;Standing Standing Hip Extension: AROM;Left;10 reps;Standing    General Comments General comments (skin integrity, edema, etc.): wife present      Pertinent Vitals/Pain Pain Assessment: Faces Faces Pain Scale: Hurts a little bit Pain Location: L hip Pain Descriptors / Indicators: Sore Pain Intervention(s): Limited activity within patient's tolerance;Monitored during session;Premedicated before session;Repositioned    Home Living                      Prior Function            PT Goals (current goals can now be found in the care plan section) Acute Rehab PT Goals Patient Stated Goal: To go home today Progress towards PT goals: Progressing toward goals    Frequency    7X/week      PT Plan Current plan remains appropriate    Co-evaluation             End of Session Equipment Utilized During Treatment: Gait belt Activity Tolerance: Patient tolerated treatment well Patient left: in bed;with call bell/phone within reach;with family/visitor present Nurse Communication:  Mobility status PT Visit Diagnosis: Difficulty in walking, not elsewhere classified (R26.2);Pain Pain - Right/Left: Left Pain - part of body: Hip     Time: 6834-1962 PT Time Calculation (min) (ACUTE ONLY): 28 min  Charges:  $Therapeutic Exercise: 23-37 mins                    G Codes:       Earney Navy, PTA Pager: [phone]     Darliss Cheney 03/10/2017, 3:06 PM

## 2017-03-10 NOTE — Evaluation (Signed)
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  PMH positive for GERD, hyperlipidemia and OA.  Clinical Impression  Patient presents with decreased mobility due to pain and L LE weakness.  He will benefit from skilled PT in the acute setting to allow return home with family support and follow up HHPT initially.  Will check back later as time permits today to review HEP.     Follow Up Recommendations Home health PT    Equipment Recommendations  None recommended by PT    Recommendations for Other Services       Precautions / Restrictions Precautions Precautions: Fall Restrictions Weight Bearing Restrictions: Yes LLE Weight Bearing: Weight bearing as tolerated      Mobility  Bed Mobility Overal bed mobility: Needs Assistance Bed Mobility: Supine to Sit;Sit to Supine     Supine to sit: Supervision Sit to supine: Supervision   General bed mobility comments: cues and demonstration for assisting L LE with R  Transfers Overall transfer level: Needs assistance Equipment used: Rolling walker (2 wheeled) Transfers: Sit to/from Stand Sit to Stand: Min guard         General transfer comment: for balance  Ambulation/Gait Ambulation/Gait assistance: Min guard;Supervision Ambulation Distance (Feet): 250 Feet Assistive device: Rolling walker (2 wheeled) Gait Pattern/deviations: Step-through pattern;Step-to pattern;Decreased stride length;Decreased stance time - left     General Gait Details: cues for flexion of hip with ambulation as initially stiff leg gait  Stairs Stairs: Yes Stairs assistance: Min guard Stair Management: Backwards;With walker Number of Stairs: 1 General stair comments: cues for technique, assist for safety  Wheelchair Mobility    Modified Rankin (Stroke Patients Only)        Balance Overall balance assessment: Needs assistance Sitting-balance support: No upper extremity supported Sitting balance-Leahy Scale: Good     Standing balance support: No upper extremity supported Standing balance-Leahy Scale: Fair Standing balance comment: standing without UE support upon my entry to room                             Pertinent Vitals/Pain Pain Assessment: 0-10 Pain Score: 2  Pain Location: L hip Pain Descriptors / Indicators: Sore Pain Intervention(s): Monitored during session;Repositioned    Home Living Family/patient expects to be discharged to:: Private residence Living Arrangements: Spouse/significant other Available Help at Discharge: Family Type of Home: House Home Access: Stairs to enter Entrance Stairs-Rails: None Entrance Stairs-Number of Steps: 1 Home Layout: One level Home Equipment: Environmental consultant - 2 wheels;Shower seat;Adaptive equipment      Prior Function Level of Independence: Independent               Hand Dominance   Dominant Hand: Right    Extremity/Trunk Assessment   Upper Extremity Assessment Upper Extremity Assessment: Overall WFL for tasks assessed    Lower Extremity Assessment Lower Extremity Assessment: LLE deficits/detail LLE Deficits / Details: AROM limited due to stiffness, but AAROM WFL, strength at least 3/5       Communication   Communication: No difficulties  Cognition Arousal/Alertness: Awake/alert Behavior During Therapy: WFL for tasks assessed/performed Overall Cognitive Status: Within Functional Limits for tasks assessed  General Comments General comments (skin integrity, edema, etc.): simulated car transfer on edge of mat in gym    Exercises Total Joint Exercises Ankle Circles/Pumps: AROM;Both;10 reps;Seated   Assessment/Plan    PT Assessment Patient needs continued PT services  PT Problem List Pain;Decreased  mobility;Decreased knowledge of precautions;Decreased range of motion;Decreased strength       PT Treatment Interventions DME instruction;Gait training;Balance training;Stair training;Functional mobility training;Therapeutic exercise;Patient/family education;Therapeutic activities    PT Goals (Current goals can be found in the Care Plan section)  Acute Rehab PT Goals Patient Stated Goal: To go home today PT Goal Formulation: With patient/family Time For Goal Achievement: 03/12/17 Potential to Achieve Goals: Good    Frequency 7X/week   Barriers to discharge        Co-evaluation               End of Session Equipment Utilized During Treatment: Gait belt Activity Tolerance: Patient tolerated treatment well Patient left: in bed;with call bell/phone within reach;with family/visitor present   PT Visit Diagnosis: Difficulty in walking, not elsewhere classified (R26.2);Pain Pain - Right/Left: Left Pain - part of body: Hip    Time: 0825-0855 PT Time Calculation (min) (ACUTE ONLY): 30 min   Charges:   PT Evaluation $PT Eval Moderate Complexity: 1 Procedure PT Treatments $Gait Training: 8-22 mins   PT G CodesMagda Kiel, [place] [phone] 03/10/2017   Reginia Naas 03/10/2017, 9:49 AM

## 2017-03-11 LAB — BASIC METABOLIC PANEL
Anion gap: 7 (ref 5–15)
BUN: 19 mg/dL (ref 6–20)
CO2: 24 mmol/L (ref 22–32)
Calcium: 8.8 mg/dL — ABNORMAL LOW (ref 8.9–10.3)
Chloride: 111 mmol/L (ref 101–111)
Creatinine, Ser: 1.19 mg/dL (ref 0.61–1.24)
GFR calc Af Amer: >60 mL/min (ref >60)
GFR calc non Af Amer: >60 mL/min (ref >60)
Glucose, Bld: 138 mg/dL — ABNORMAL HIGH (ref 65–99)
Potassium: 4.2 mmol/L (ref 3.5–5.1)
Sodium: 142 mmol/L (ref 135–145)

## 2017-03-11 LAB — CBC
HCT: 28.8 % — ABNORMAL LOW (ref 39.0–52.0)
Hemoglobin: 9.7 g/dL — ABNORMAL LOW (ref 13.0–17.0)
MCH: 31.9 pg (ref 26.0–34.0)
MCHC: 33.7 g/dL (ref 30.0–36.0)
MCV: 94.7 fL (ref 78.0–100.0)
Platelets: 96 10*3/uL — ABNORMAL LOW (ref 150–400)
RBC: 3.04 MIL/uL — ABNORMAL LOW (ref 4.22–5.81)
RDW: 13.5 % (ref 11.5–15.5)
WBC: 7.5 10*3/uL (ref 4.0–10.5)

## 2017-03-11 NOTE — Progress Notes (Signed)
Discharge instructions (including medications) discussed with and copy provided to patient/caregiver 

## 2017-03-11 NOTE — Progress Notes (Signed)
   Subjective:  Patient reports pain as mild to moderate.  Denies N/V/CP/SOB.  Objective:   VITALS:   Vitals:   03/10/17 0516 03/10/17 1431 03/10/17 2005 03/11/17 0445  BP: (!) 109/56 (!) 121/52 131/65 127/69  Pulse: (!) 53 (!) 51 (!) 59 (!) 51  Resp: 17 18 18 18   Temp: 98.6 F (37 C) 98 F (36.7 C) 98.2 F (36.8 C) 98.3 F (36.8 C)  TempSrc: Oral Oral Oral Oral  SpO2: 97% 98% 99% 98%  Weight:      Height:        NAD ABD soft Sensation intact distally Intact pulses distally Dorsiflexion/Plantar flexion intact Incision: dressing C/D/I Compartment soft   Lab Results  Component Value Date   WBC 9.2 03/10/2017   HGB 10.0 (L) 03/10/2017   HCT 30.5 (L) 03/10/2017   MCV 93.3 03/10/2017   PLT 105 (L) 03/10/2017   BMET    Component Value Date/Time   NA 138 03/10/2017 1414   K 4.7 03/10/2017 1414   CL 107 03/10/2017 1414   CO2 22 03/10/2017 1414   GLUCOSE 216 (H) 03/10/2017 1414   BUN 22 (H) 03/10/2017 1414   CREATININE 1.36 (H) 03/10/2017 1414   CALCIUM 8.8 (L) 03/10/2017 1414   GFRNONAA 54 (L) 03/10/2017 1414   GFRAA >60 03/10/2017 1414     Assessment/Plan: 2 Days Post-Op   Principal Problem:   Avascular necrosis of hip, left ([facility])   WBAT with walker DVT ppx: ASA, SCDs, TEDs PO pain control PT Mild elevation in CR, encourage PO fluids, am labs pending Dispo: Final disposition pending BMP this morning, likely discharge home today   Elie Goody 03/11/2017, 7:55 AM   Rod Can, MD Cell [phone]

## 2017-03-11 NOTE — Anesthesia Postprocedure Evaluation (Addendum)
Anesthesia Post Note  Patient: Austin Oliver  Procedure(s) Performed: Procedure(s) (LRB): LEFT TOTAL HIP ARTHROPLASTY ANTERIOR APPROACH (Left)  Patient location during evaluation: PACU Anesthesia Type: General Level of consciousness: awake and sedated Pain management: pain level controlled Vital Signs Assessment: post-procedure vital signs reviewed and stable Respiratory status: spontaneous breathing, nonlabored ventilation, respiratory function stable and patient connected to nasal cannula oxygen Cardiovascular status: blood pressure returned to baseline and stable Postop Assessment: no signs of nausea or vomiting Anesthetic complications: no       Last Vitals:  Vitals:   03/10/17 2005 03/11/17 0445  BP: 131/65 127/69  Pulse: (!) 59 (!) 51  Resp: 18 18  Temp: 36.8 C 36.8 C    Last Pain:  Vitals:   03/11/17 0620  TempSrc:   PainSc: 5                  Che Below,JAMES TERRILL

## 2017-03-11 NOTE — Progress Notes (Signed)
Tech offered Pt a bath. Pt stated he will be going home today and will take a shower once he is home.

## 2017-03-11 NOTE — Care Management Note (Signed)
Case Management Note  Patient Details  Name: Austin Oliver MRN: [id number] Date of Birth: 1956-01-28  Subjective/Objective:                 Spoke with patient at the bedside. He states he has a RW and 3 in 1 at home. He states he does not want HH PT, and that he told this to his MD this morning on rounds.   Action/Plan:  DC to home, self care. Declined HH and states he already has DME.  Expected Discharge Date:  03/11/17               Expected Discharge Plan:  Home/Self Care  In-House Referral:     Discharge planning Services  CM Consult  Post Acute Care Choice:    Choice offered to:  Patient  DME Arranged:    DME Agency:     HH Arranged:    [facility] Agency:     Status of Service:  Completed, signed off  If discussed at [employer] of Stay Meetings, dates discussed:    Additional Comments:  Carles Collet, RN 03/11/2017, 10:21 AM

## 2017-03-11 NOTE — Progress Notes (Signed)
Patient states that he does want to use his walker from home after discharge and would prefer one from the hospital. RN informed case management of this. Patient informed that case management is aware

## 2017-03-11 NOTE — Progress Notes (Signed)
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  PMH positive for GERD, hyperlipidemia and OA.    PT Comments    Pt performed stair training, gait training and reviewed standing exercises in HEP.  Pt verbalized he had performed supine and seated exercises this am.  Pt is safe for d/c home at this time.     Follow Up Recommendations  Home health PT     Equipment Recommendations  None recommended by PT    Recommendations for Other Services       Precautions / Restrictions Precautions Precautions: Fall Restrictions Weight Bearing Restrictions: Yes LLE Weight Bearing: Weight bearing as tolerated    Mobility  Bed Mobility Overal bed mobility: Modified Independent Bed Mobility: Supine to Sit     Supine to sit: Modified independent (Device/Increase time)     General bed mobility comments: Increased time but no assistance needed and no use of rail.    Transfers Overall transfer level: Modified independent Equipment used: Rolling walker (2 wheeled) Transfers: Sit to/from Stand Sit to Stand: Modified independent (Device/Increase time)         General transfer comment: Good technique.  Cues for eccentric loading but overall safe with technique,    Ambulation/Gait Ambulation/Gait assistance: Supervision Ambulation Distance (Feet): 250 Feet Assistive device: Rolling walker (2 wheeled) Gait Pattern/deviations: Step-through pattern;Trunk flexed     General Gait Details: Cues for progression to step through and pushing RW vs. picking up RW to advance forward.     Stairs Stairs: Yes   Stair Management: Backwards;With walker;One rail Right;Forwards Number of Stairs: 4 General stair comments: x1 curb training backwards with RW, cues for sequencing and RW placement.  Pt performed x3 stairs with R  rail, cues for sequencing and hand placement on rail performed this trial forward.    Wheelchair Mobility    Modified Rankin (Stroke Patients Only)       Balance                                            Cognition Arousal/Alertness: Awake/alert Behavior During Therapy: WFL for tasks assessed/performed Overall Cognitive Status: Within Functional Limits for tasks assessed                                        Exercises Total Joint Exercises Hip ABduction/ADduction: AROM;Left;10 reps;Standing Knee Flexion: AROM;Left;10 reps;Standing Marching in Standing: AROM;Left;10 reps;Standing Standing Hip Extension: AROM;Left;10 reps;Standing    General Comments        Pertinent Vitals/Pain Pain Assessment: Faces Faces Pain Scale: Hurts little more Pain Location: L hip/quad Pain Descriptors / Indicators: Sore;Tightness Pain Intervention(s): Monitored during session;Repositioned    Home Living                      Prior Function            PT Goals (current goals can now be found in the care plan section) Acute Rehab PT Goals Patient Stated Goal: To go home today Potential to Achieve Goals: Good    Frequency    7X/week      PT  Plan Current plan remains appropriate    Co-evaluation             End of Session Equipment Utilized During Treatment: Gait belt Activity Tolerance: Patient tolerated treatment well Patient left: in chair;with call bell/phone within reach Nurse Communication: Mobility status PT Visit Diagnosis: Difficulty in walking, not elsewhere classified (R26.2);Pain Pain - Right/Left: Left Pain - part of body: Hip     Time: 0913-0929 PT Time Calculation (min) (ACUTE ONLY): 16 min  Charges:  $Gait Training: 8-22 mins                    G Codes:       Governor Rooks, PTA pager [phone]    Cristela Blue 03/11/2017, 9:34 AM

## 2017-04-17 NOTE — Addendum Note (Signed)
Addendum  created 04/17/17 1412 by Rica Koyanagi, MD   Sign clinical note

## 2018-02-23 DIAGNOSIS — M6530 Trigger finger, unspecified finger: Secondary | ICD-10-CM | POA: Insufficient documentation

## 2019-03-15 DIAGNOSIS — M542 Cervicalgia: Secondary | ICD-10-CM | POA: Insufficient documentation

## 2019-03-15 DIAGNOSIS — G8929 Other chronic pain: Secondary | ICD-10-CM | POA: Insufficient documentation

## 2019-05-09 DIAGNOSIS — M5030 Other cervical disc degeneration, unspecified cervical region: Secondary | ICD-10-CM | POA: Insufficient documentation

## 2019-05-09 DIAGNOSIS — M961 Postlaminectomy syndrome, not elsewhere classified: Secondary | ICD-10-CM | POA: Insufficient documentation

## 2019-09-13 ENCOUNTER — Encounter (INDEPENDENT_AMBULATORY_CARE_PROVIDER_SITE_OTHER): Payer: Self-pay

## 2019-11-22 DIAGNOSIS — M25561 Pain in right knee: Secondary | ICD-10-CM | POA: Insufficient documentation

## 2020-02-24 ENCOUNTER — Telehealth: Payer: Self-pay | Admitting: Physician Assistant

## 2020-02-24 NOTE — Telephone Encounter (Signed)
Received a new referral from Dr. Arelia Sneddon for pancytopenia. I cld and scheduled Austin Oliver to see [name] on 4/22 at 1pm. Appt date and time has been given to the pt's wife. Aware to arrive 15 minutes early.

## 2020-03-08 ENCOUNTER — Inpatient Hospital Stay: Payer: Medicare Other | Attending: Physician Assistant | Admitting: Physician Assistant

## 2020-03-08 ENCOUNTER — Other Ambulatory Visit: Payer: Self-pay | Admitting: Physician Assistant

## 2020-03-08 ENCOUNTER — Inpatient Hospital Stay: Payer: Medicare Other

## 2020-03-08 ENCOUNTER — Other Ambulatory Visit: Payer: Self-pay

## 2020-03-08 ENCOUNTER — Telehealth: Payer: Self-pay | Admitting: Physician Assistant

## 2020-03-08 VITALS — BP 132/81 | HR 48 | Temp 98.0°F | Ht 69.5 in | Wt 236.3 lb

## 2020-03-08 DIAGNOSIS — D649 Anemia, unspecified: Secondary | ICD-10-CM

## 2020-03-08 DIAGNOSIS — E785 Hyperlipidemia, unspecified: Secondary | ICD-10-CM | POA: Diagnosis not present

## 2020-03-08 DIAGNOSIS — D61818 Other pancytopenia: Secondary | ICD-10-CM | POA: Diagnosis not present

## 2020-03-08 DIAGNOSIS — D7589 Other specified diseases of blood and blood-forming organs: Secondary | ICD-10-CM

## 2020-03-08 DIAGNOSIS — E039 Hypothyroidism, unspecified: Secondary | ICD-10-CM | POA: Diagnosis not present

## 2020-03-08 DIAGNOSIS — B0230 Zoster ocular disease, unspecified: Secondary | ICD-10-CM | POA: Diagnosis not present

## 2020-03-08 LAB — HEPATITIS PANEL, ACUTE
HCV Ab: NONREACTIVE
Hep A IgM: NONREACTIVE
Hep B C IgM: NONREACTIVE
Hepatitis B Surface Ag: NONREACTIVE

## 2020-03-08 LAB — CBC WITH DIFFERENTIAL (CANCER CENTER ONLY)
Abs Immature Granulocytes: 0.01 10*3/uL (ref 0.00–0.07)
Basophils Absolute: 0 10*3/uL (ref 0.0–0.1)
Basophils Relative: 1 %
Eosinophils Absolute: 0.1 10*3/uL (ref 0.0–0.5)
Eosinophils Relative: 2 %
HCT: 40.4 % (ref 39.0–52.0)
Hemoglobin: 13.2 g/dL (ref 13.0–17.0)
Immature Granulocytes: 0 %
Lymphocytes Relative: 27 %
Lymphs Abs: 0.9 10*3/uL (ref 0.7–4.0)
MCH: 31.6 pg (ref 26.0–34.0)
MCHC: 32.7 g/dL (ref 30.0–36.0)
MCV: 96.7 fL (ref 80.0–100.0)
Monocytes Absolute: 0.3 10*3/uL (ref 0.1–1.0)
Monocytes Relative: 9 %
Neutro Abs: 2.1 10*3/uL (ref 1.7–7.7)
Neutrophils Relative %: 61 %
Platelet Count: 101 10*3/uL — ABNORMAL LOW (ref 150–400)
RBC: 4.18 MIL/uL — ABNORMAL LOW (ref 4.22–5.81)
RDW: 13.2 % (ref 11.5–15.5)
WBC Count: 3.4 10*3/uL — ABNORMAL LOW (ref 4.0–10.5)
nRBC: 0 % (ref 0.0–0.2)

## 2020-03-08 LAB — CMP (CANCER CENTER ONLY)
ALT: 53 U/L — ABNORMAL HIGH (ref 0–44)
AST: 34 U/L (ref 15–41)
Albumin: 3.8 g/dL (ref 3.5–5.0)
Alkaline Phosphatase: 56 U/L (ref 38–126)
Anion gap: 8 (ref 5–15)
BUN: 15 mg/dL (ref 8–23)
CO2: 24 mmol/L (ref 22–32)
Calcium: 8.7 mg/dL — ABNORMAL LOW (ref 8.9–10.3)
Chloride: 108 mmol/L (ref 98–111)
Creatinine: 1.31 mg/dL — ABNORMAL HIGH (ref 0.61–1.24)
GFR, Est AFR Am: >60 mL/min (ref >60)
GFR, Estimated: 57 mL/min — ABNORMAL LOW (ref >60)
Glucose, Bld: 196 mg/dL — ABNORMAL HIGH (ref 70–99)
Potassium: 4.8 mmol/L (ref 3.5–5.1)
Sodium: 140 mmol/L (ref 135–145)
Total Bilirubin: 2.2 mg/dL — ABNORMAL HIGH (ref 0.3–1.2)
Total Protein: 7.1 g/dL (ref 6.5–8.1)

## 2020-03-08 LAB — LACTATE DEHYDROGENASE: LDH: 231 U/L — ABNORMAL HIGH (ref 98–192)

## 2020-03-08 LAB — HIV ANTIBODY (ROUTINE TESTING W REFLEX): HIV Screen 4th Generation wRfx: NONREACTIVE

## 2020-03-08 NOTE — Progress Notes (Signed)
[place] [phone]   [phone]  CONSULT NOTE  REFERRING PHYSICIAN: Dr. Arelia Sneddon  REASON FOR CONSULTATION:  Pancytopenia  HPI Austin Oliver is a 64 y.o. male with past medical history significant for shingles (currently undergoing treatment with Valtrex), hypothyroidism, hyperlipidemia, osteoarthritis, bradycardia, and GERD is referred to the clinic for evaluation of pancytopenia.  Per chart review, it appears that the patient has had intermittent periods of mild anemia, thrombocytopenia, and/or leukocytopenia at least for the last 2 years.  The patient recently had an appointment with his primary care provider on February 15, 2020 for a visit regarding his right knee swelling.  The patient had blood work performed at this visit which noted persistent cytopenias with total white blood cell count at 3.0, mild anemia at 12.7 (normocytic), low platelets at 110 K.  He did have further work-up with vitamin B12, folate, ANA, and ferritin which were unremarkable.  The patient was referred to our clinic today for further evaluation and recommendations regarding further work-up.  Overall, the patient is feeling well.  The patient was diagnosed with herpes zoster ophthalmicus about 1 year ago and has been taking Valtrex consistently from that time.  He follows with ophthalmology as well.  The patient denies any recent fevers, chills, or night sweats. He had an episode of fevers, chills, and night sweats that lasted two days in the winter.  He denies any unexpected or unexplained weight loss.  He denies any lymphadenopathy.  He denies any frequent infections except for getting a sinus infection approximately 2-3x per year.  He denies any nausea, vomiting, diarrhea, or constipation.  He denies any abnormal bleeding or bruising.  The patient states he is up-to-date on his colonoscopy.  He also had an endoscopy a couple years ago.  Do not have access to those records. He  denies any herbal supplements.   The patient denies any family history of any bone marrow or hematologic abnormalities.  The patient's mother had dementia and chronic kidney disease.  The patient's father had lung cancer and COPD.  Has 1 brother who also has arthritis and trigger finger.   The patient used to work as a Programmer, applications.  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  The patient is married and has 2 sons.  He denies any alcohol use.  He denies any history of drug use.  He is a former smoker having smoked approximately 1.5 packs/day for about 30 to 35 years.  HPI  Past Medical History:  Diagnosis Date  . Bradycardia   . Coronary artery calcification seen on CAT scan 01/14/2017  . Family history of thyroid problem   . GERD (gastroesophageal reflux disease)   . History of kidney stones   . Hyperlipidemia   . Hypothyroidism   . Osteoarthritis   . Shingles 2012  . Thyroid disease     Past Surgical History:  Procedure Laterality Date  . HEEL SPUR SURGERY Left 80's  . KNEE SURGERY Bilateral    numerous times  . NECK SURGERY  70's  . TOTAL HIP ARTHROPLASTY Left 03/09/2017   Procedure: LEFT TOTAL HIP ARTHROPLASTY ANTERIOR APPROACH;  Surgeon: Rod Can, MD;  Location: [facility];  Service: Orthopedics;  Laterality: Left;  Dr. requesting RNFA    Family History  Problem Relation Age of Onset  . Dementia Mother   . Heart Problems Father   . COPD Father   . Lung cancer Father  Social History Social History   Tobacco Use  . Smoking status: Former Smoker    Quit date: 12/05/2016    Years since quitting: 3.2  . Smokeless tobacco: Never Used  Substance Use Topics  . Alcohol use: No  . Drug use: No    Allergies  Allergen Reactions  . Bee Venom Swelling    Extreme Swelling of site    . Cleocin [Clindamycin Hcl] Rash    RASH IN BETWEEN FINGERS    Current Outpatient Medications  Medication Sig Dispense Refill  . amitriptyline (ELAVIL) 50 MG tablet  Take 1 tablet by mouth daily as needed.    Marland Kitchen aspirin 81 MG chewable tablet Chew 81 mg by mouth once.    . Coenzyme Q10 (CO Q-10) 100 MG CAPS Take 100 mg by mouth daily with supper. Ultra Co q10    . docusate sodium (COLACE) 100 MG capsule Take 1 capsule (100 mg total) by mouth 2 (two) times daily. 10 capsule 0  . ezetimibe (ZETIA) 10 MG tablet Take 1 tablet by mouth daily.    Marland Kitchen ketotifen (ALAWAY) 0.025 % ophthalmic solution Place 1 drop into both eyes 2 (two) times daily as needed (for allergy eyes).    Marland Kitchen levothyroxine (SYNTHROID, LEVOTHROID) 75 MCG tablet Take 75 mcg by mouth daily before breakfast.    . loteprednol (LOTEMAX) 0.5 % ophthalmic suspension Place 1 drop into the left eye every other day.     . Omega-3 Fatty Acids (FISH OIL PO) Take 1,300 mg by mouth 2 (two) times daily.    . pantoprazole (PROTONIX) 40 MG tablet Take 1 tablet by mouth daily with supper.    . terazosin (HYTRIN) 5 MG capsule Take 5 mg by mouth at bedtime.    . traMADol (ULTRAM) 50 MG tablet Take 1 tablet by mouth every 6 (six) hours as needed.    . valACYclovir (VALTREX) 500 MG tablet Take 500 mg by mouth daily.     No current facility-administered medications for this visit.    Review of Systems Review of Systems  Constitutional: Negative for appetite change, chills, fatigue, fever and unexpected weight change.  HENT: Negative for mouth sores, nosebleeds, sore throat and trouble swallowing.   Eyes: Negative for eye problems and icterus.  Respiratory: Negative for cough, hemoptysis, shortness of breath and wheezing.  Cardiovascular: Negative for chest pain and leg swelling.  Gastrointestinal: Negative for abdominal pain, constipation, diarrhea, nausea and vomiting.  Genitourinary: Negative for bladder incontinence, difficulty urinating, dysuria, frequency and hematuria.   Musculoskeletal: Negative for back pain, gait problem, neck pain and neck stiffness.  Skin: Negative for itching and rash.  Neurological:  Negative for dizziness, extremity weakness, gait problem, headaches, light-headedness and seizures.  Hematological: Negative for adenopathy. Does not bruise/bleed easily.  Psychiatric/Behavioral: Negative for confusion, depression and sleep disturbance. The patient is not nervous/anxious.     PHYSICAL EXAMINATION:  Blood pressure 132/81, pulse (!) 48, temperature 98 F (36.7 C), height 5' 9.5" (1.765 m), weight 236 lb 4.8 oz (107.2 kg), SpO2 100 %.  ECOG PERFORMANCE STATUS: 0  Physical Exam  Constitutional: Oriented to person, place, and time and well-developed, well-nourished, and in no distress.  HENT:  Head: Normocephalic and atraumatic.  Mouth/Throat: Oropharynx is clear and moist. No oropharyngeal exudate.  Eyes: Conjunctivae are normal. Right eye exhibits no discharge. Left eye exhibits no discharge. No scleral icterus.  Neck: Normal range of motion. Neck supple.  Cardiovascular: Normal rate, regular rhythm, normal heart sounds and intact distal  pulses.   Pulmonary/Chest: Effort normal and breath sounds normal. No respiratory distress. No wheezes. No rales.  Abdominal: Soft. Bowel sounds are normal. Exhibits no distension and no mass. There is no tenderness.  Musculoskeletal: Normal range of motion. Exhibits no edema.  Lymphadenopathy:    No cervical adenopathy.  Neurological: Alert and oriented to person, place, and time. Exhibits normal muscle tone. Gait normal. Coordination normal.  Skin: Skin is warm and dry. No rash noted. Not diaphoretic. No erythema. No pallor.  Psychiatric: Mood, memory and judgment normal.  Vitals reviewed.  LABORATORY DATA: Lab Results  Component Value Date   WBC 3.4 (L) 03/08/2020   HGB 13.2 03/08/2020   HCT 40.4 03/08/2020   MCV 96.7 03/08/2020   PLT 101 (L) 03/08/2020      Chemistry      Component Value Date/Time   NA 140 03/08/2020 1244   K 4.8 03/08/2020 1244   CL 108 03/08/2020 1244   CO2 24 03/08/2020 1244   BUN 15 03/08/2020 1244     CREATININE 1.31 (H) 03/08/2020 1244      Component Value Date/Time   CALCIUM 8.7 (L) 03/08/2020 1244   ALKPHOS 56 03/08/2020 1244   AST 34 03/08/2020 1244   ALT 53 (H) 03/08/2020 1244   BILITOT 2.2 (H) 03/08/2020 1244       RADIOGRAPHIC STUDIES: No results found.  ASSESSMENT: This is a very pleasant 64 year old Caucasian male referred to the clinic for evaluation of intermittent pancytopenia.   PLAN: The patient was seen with Dr. Julien Nordmann today.  The patient had several other lab studies performed today to further evaluate his condition.  He had a repeat CBC, CMP, LDH, hepatitis panel, rheumatoid factor, and HIV panel performed.  His CBC from today showed persistent leukocytopenia with a white blood cell count of 3.4.  His hemoglobin was within normal limits at 13.2.  His platelet count was a little low at 101k. His bilirubin is slightly high at 2.2.   Dr. Julien Nordmann believes the neutropenia may be related to his medications; however, we cannot exclude a bone marrow etiology.   Dr. Julien Nordmann recommends that the patient have a bone marrow biopsy and aspirate to rule out a bone marrow etiology.   I will arrange for this to be done in about 1-3 weeks. We will see him back for a follow up visit a few days after to review the results.   The patient voices understanding of current disease status and treatment options and is in agreement with the current care plan.  All questions were answered. The patient knows to call the clinic with any problems, questions or concerns. We can certainly see the patient much sooner if necessary.  Thank you so much for allowing me to participate in the care of Austin Oliver. I will continue to follow up the patient with you and assist in his care.  The total time spent in the appointment was 60 minutes.  Disclaimer: This note was dictated with voice recognition software. Similar sounding words can inadvertently be transcribed and may not be corrected upon  review.   Austin Oliver March 08, 2020, 2:01 PM  ADDENDUM: Hematology/Oncology Attending: I had a face-to-face encounter with the patient.  I recommended his care plan.  This is a very pleasant 64 years old white male with multiple medical problems including herpes zoster of the eye and currently on treatment with Valtrex.  The patient also has a history of dyslipidemia and currently on treatment  with Zetia.  He was seen by his primary care physician and noticed on several occasion that he has bicytopenia with decrease in the total white blood count as well as platelet counts. The patient was referred to [place] today for evaluation and recommendation regarding his condition. He has extensive work-up including rheumatoid factor, HIV, hepatitis panel as well as ANA, vitamin B12, serum folate that were unremarkable. 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 I recommended for the patient to proceed with a bone marrow biopsy and aspirate to rule out any bone marrow abnormality. We will arrange for the patient to come back for follow-up visit 1 week after the biopsy for discussion of his biopsy results and further recommendation regarding his condition. If his biopsy is unremarkable, the patient will follow up with his primary care physician with continuous monitoring of his blood count which likely will be secondary to his medication. The patient agreed to the current plan. He was advised to call immediately if he has any concerning symptoms in the interval.  Disclaimer: This note was dictated with voice recognition software. Similar sounding words can inadvertently be transcribed and may be missed upon review. Eilleen Kempf, MD 03/09/20

## 2020-03-08 NOTE — Telephone Encounter (Signed)
Scheduled appt per 4/22 LOS _ unable to reach pt . Left message with appt date and time

## 2020-03-09 ENCOUNTER — Encounter: Payer: Self-pay | Admitting: Physician Assistant

## 2020-03-09 LAB — RHEUMATOID FACTOR: Rhuematoid fact SerPl-aCnc: <10 IU/mL (ref 0.0–13.9)

## 2020-03-14 ENCOUNTER — Telehealth: Payer: Self-pay

## 2020-03-14 NOTE — Telephone Encounter (Signed)
Called to schedule Bone marrow with Wilber Bihari, NP. He is available on 5/5. Instructed to arrive before 0730 for appt. Scheduling message sent to schedule on 5/5. He verbalized understanding. Flow cytometry called, they agreed to date/time. Instructed to call the office for questions. He verbalized understanding.

## 2020-03-21 ENCOUNTER — Other Ambulatory Visit: Payer: Self-pay

## 2020-03-21 ENCOUNTER — Inpatient Hospital Stay: Payer: Medicare Other

## 2020-03-21 ENCOUNTER — Inpatient Hospital Stay: Payer: Medicare Other | Attending: Internal Medicine | Admitting: Adult Health

## 2020-03-21 VITALS — BP 123/64 | HR 45 | Temp 98.6°F | Resp 18

## 2020-03-21 DIAGNOSIS — I2510 Atherosclerotic heart disease of native coronary artery without angina pectoris: Secondary | ICD-10-CM | POA: Insufficient documentation

## 2020-03-21 DIAGNOSIS — E039 Hypothyroidism, unspecified: Secondary | ICD-10-CM | POA: Insufficient documentation

## 2020-03-21 DIAGNOSIS — K219 Gastro-esophageal reflux disease without esophagitis: Secondary | ICD-10-CM | POA: Diagnosis not present

## 2020-03-21 DIAGNOSIS — B0230 Zoster ocular disease, unspecified: Secondary | ICD-10-CM | POA: Insufficient documentation

## 2020-03-21 DIAGNOSIS — Z79899 Other long term (current) drug therapy: Secondary | ICD-10-CM | POA: Insufficient documentation

## 2020-03-21 DIAGNOSIS — D7589 Other specified diseases of blood and blood-forming organs: Secondary | ICD-10-CM

## 2020-03-21 DIAGNOSIS — M1990 Unspecified osteoarthritis, unspecified site: Secondary | ICD-10-CM | POA: Insufficient documentation

## 2020-03-21 DIAGNOSIS — E785 Hyperlipidemia, unspecified: Secondary | ICD-10-CM | POA: Diagnosis not present

## 2020-03-21 DIAGNOSIS — R0981 Nasal congestion: Secondary | ICD-10-CM | POA: Insufficient documentation

## 2020-03-21 DIAGNOSIS — D61818 Other pancytopenia: Secondary | ICD-10-CM | POA: Insufficient documentation

## 2020-03-21 LAB — CBC WITH DIFFERENTIAL (CANCER CENTER ONLY)
Abs Immature Granulocytes: 0 10*3/uL (ref 0.00–0.07)
Basophils Absolute: 0 10*3/uL (ref 0.0–0.1)
Basophils Relative: 1 %
Eosinophils Absolute: 0.1 10*3/uL (ref 0.0–0.5)
Eosinophils Relative: 5 %
HCT: 36.9 % — ABNORMAL LOW (ref 39.0–52.0)
Hemoglobin: 12.3 g/dL — ABNORMAL LOW (ref 13.0–17.0)
Immature Granulocytes: 0 %
Lymphocytes Relative: 35 %
Lymphs Abs: 0.9 10*3/uL (ref 0.7–4.0)
MCH: 31.9 pg (ref 26.0–34.0)
MCHC: 33.3 g/dL (ref 30.0–36.0)
MCV: 95.8 fL (ref 80.0–100.0)
Monocytes Absolute: 0.3 10*3/uL (ref 0.1–1.0)
Monocytes Relative: 11 %
Neutro Abs: 1.2 10*3/uL — ABNORMAL LOW (ref 1.7–7.7)
Neutrophils Relative %: 48 %
Platelet Count: 95 10*3/uL — ABNORMAL LOW (ref 150–400)
RBC: 3.85 MIL/uL — ABNORMAL LOW (ref 4.22–5.81)
RDW: 13 % (ref 11.5–15.5)
WBC Count: 2.5 10*3/uL — ABNORMAL LOW (ref 4.0–10.5)
nRBC: 0 % (ref 0.0–0.2)

## 2020-03-21 NOTE — Progress Notes (Signed)
Patient discharged after 30 min post observation. Dressing C/D/I and VSS. Reviewed and provided DC paperwork to patient. He verbalized understanding and had no questions or concerns.

## 2020-03-21 NOTE — Patient Instructions (Signed)
Bone Marrow Aspiration and Bone Marrow Biopsy, Adult, Care After This sheet gives you information about how to care for yourself after your procedure. Your health care provider may also give you more specific instructions. If you have problems or questions, contact your health care provider. What can I expect after the procedure? After the procedure, it is common to have:  Mild pain and tenderness.  Swelling.  Bruising. Follow these instructions at home: Puncture site care   Follow instructions from your health care provider about how to take care of the puncture site. Make sure you: ? Wash your hands with soap and water before and after you change your bandage (dressing). If soap and water are not available, use hand sanitizer. ? Change your dressing as told by your health care provider.  Check your puncture site every day for signs of infection. Check for: ? More redness, swelling, or pain. ? Fluid or blood. ? Warmth. ? Pus or a bad smell. Activity  Return to your normal activities as told by your health care provider. Ask your health care provider what activities are safe for you.  Do not lift anything that is heavier than 10 lb (4.5 kg), or the limit that you are told, until your health care provider says that it is safe.  Do not drive for 24 hours if you were given a sedative during your procedure. General instructions   Take over-the-counter and prescription medicines only as told by your health care provider.  Do not take baths, swim, or use a hot tub until your health care provider approves. Ask your health care provider if you may take showers. You may only be allowed to take sponge baths.  If directed, put ice on the affected area. To do this: ? Put ice in a plastic bag. ? Place a towel between your skin and the bag. ? Leave the ice on for 20 minutes, 2-3 times a day.  Keep all follow-up visits as told by your health care provider. This is important. Contact a  health care provider if:  Your pain is not controlled with medicine.  You have a fever.  You have more redness, swelling, or pain around the puncture site.  You have fluid or blood coming from the puncture site.  Your puncture site feels warm to the touch.  You have pus or a bad smell coming from the puncture site. Summary  After the procedure, it is common to have mild pain, tenderness, swelling, and bruising.  Follow instructions from your health care provider about how to take care of the puncture site and what activities are safe for you.  Take over-the-counter and prescription medicines only as told by your health care provider.  Contact a health care provider if you have any signs of infection, such as fluid or blood coming from the puncture site. This information is not intended to replace advice given to you by your health care provider. Make sure you discuss any questions you have with your health care provider. Document Revised: 03/22/2019 Document Reviewed: 03/22/2019 Elsevier Patient Education  2020 [employer]  

## 2020-03-21 NOTE — Progress Notes (Signed)
INDICATION: pancytopenia    Bone Marrow Biopsy and Aspiration Procedure Note   Informed consent was obtained and potential risks including bleeding, infection and pain were reviewed with the patient.  The patient's name, date of birth, identification, consent and allergies were verified prior to the start of procedure and time out was performed.  The left posterior iliac crest was chosen as the site of biopsy.  The skin was prepped with ChloraPrep.   12 cc of 2% lidocaine was used to provide local anaesthesia.   10 cc of bone marrow aspirate was obtained followed by 1cm biopsy.  Pressure was applied to the biopsy site and bandage was placed over the biopsy site. Patient was made to lie on the back for 30 mins prior to discharge.  The procedure was tolerated well. COMPLICATIONS: None BLOOD LOSS: none The patient was discharged home in stable condition with a 1 week follow up to review results.  Patient was provided with post bone marrow biopsy instructions and instructed to call if there was any bleeding or worsening pain.  Specimens sent for flow cytometry, cytogenetics and additional studies.  Signed Scot Dock, NP

## 2020-03-22 ENCOUNTER — Telehealth: Payer: Self-pay | Admitting: Adult Health

## 2020-03-22 NOTE — Telephone Encounter (Signed)
No 5/5 los. No changes made to pt's schedule.

## 2020-03-23 LAB — SURGICAL PATHOLOGY

## 2020-03-28 NOTE — Progress Notes (Signed)
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 male returns to the clinic today for a follow-up visit.  The patient was referred to the clinic for several years of intermittent periods of mild anemia, thrombocytopenia, and or leukopenia.  The patient had several studies performed including a vitamin B12, folate, ANA, ferritin, iron studies, LDH, rheumatoid factor, HIV testing, and hepatitis panel performed which were all unremarkable.  Today, the patient is feeling well without any concerning complaints.  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 He denies fever, chills, night sweats, or unexplained weight loss.  He denies any lymphadenopathy.  He denies any frequent infections except for occasional sinus infections a couple times per year and reports seasonal allergies with associated nasal congestion and itchy eyes.  The patient denies any nausea, vomiting, diarrhea, or constipation.  He denies any abnormal bleeding or bruising.  The patient had a bone marrow biopsy and aspirate performed to further evaluate his condition.  He is here today for evaluation to review these results.  MEDICAL HISTORY: Past Medical History:  Diagnosis Date  . Bradycardia   . Coronary artery calcification seen on CAT scan 01/14/2017  . Family history of thyroid problem   . GERD (gastroesophageal reflux disease)   . History of kidney stones   . Hyperlipidemia   . Hypothyroidism   . Osteoarthritis   . Shingles 2012  . Thyroid disease     ALLERGIES:  is allergic to bee venom and cleocin [clindamycin hcl].  MEDICATIONS:  Current Outpatient Medications  Medication Sig Dispense Refill  . amitriptyline (ELAVIL) 50  MG tablet Take 1 tablet by mouth daily as needed.    Marland Kitchen aspirin 81 MG chewable tablet Chew 81 mg by mouth once.    . Coenzyme Q10 (CO Q-10) 100 MG CAPS Take 100 mg by mouth daily with supper. Ultra Co q10    . ezetimibe (ZETIA) 10 MG tablet Take 1 tablet by mouth daily.    Marland Kitchen ketotifen (ALAWAY) 0.025 % ophthalmic solution Place 1 drop into both eyes 2 (two) times daily as needed (for allergy eyes).    . Levothyroxine Sodium 112 MCG CAPS Take 112 mcg by mouth daily before breakfast.     . loteprednol (LOTEMAX) 0.5 % ophthalmic suspension Place 1 drop into the left eye every other day.     . Omega-3 Fatty Acids (FISH OIL PO) Take 1,300 mg by mouth 2 (two) times daily.    . pantoprazole (PROTONIX) 40 MG tablet Take 1 tablet by mouth daily with supper.    . terazosin (HYTRIN) 5 MG capsule Take 5 mg by mouth at bedtime.    . traMADol (ULTRAM) 50 MG tablet Take 1 tablet by mouth every 6 (six) hours as needed.    . valACYclovir (VALTREX) 500 MG tablet Take 500 mg by mouth daily.     No current facility-administered medications for this visit.    SURGICAL HISTORY:  Past Surgical History:  Procedure Laterality Date  . HEEL SPUR SURGERY Left 80's  . KNEE SURGERY Bilateral    numerous times  . NECK SURGERY  70's  . TOTAL HIP ARTHROPLASTY Left 03/09/2017   Procedure: LEFT TOTAL HIP ARTHROPLASTY ANTERIOR APPROACH;  Surgeon: Rod Can, MD;  Location: [facility];  Service: Orthopedics;  Laterality: Left;  Dr. requesting RNFA    REVIEW OF SYSTEMS:   Review of Systems  Constitutional: Negative for appetite change, chills, fatigue, fever and unexpected weight change.  HENT: Positive for occasional nasal congestion secondary to allergies. Negative for mouth sores, nosebleeds, sore throat and trouble swallowing.   Eyes: Positive for occasional itchy eyes from allergies. Negative for icterus.  Respiratory: Negative for cough, hemoptysis, shortness of breath and wheezing.  Cardiovascular: Negative for chest  pain and leg swelling.  Gastrointestinal: Negative for abdominal pain, constipation, diarrhea, nausea and vomiting.  Genitourinary: Negative for bladder incontinence, difficulty urinating, dysuria, frequency and hematuria.   Musculoskeletal: Negative for back pain, gait problem, neck pain and neck stiffness.  Skin: Negative for itching and rash.  Neurological: Negative for dizziness, extremity weakness, gait problem, headaches, light-headedness and seizures.  Hematological: Negative for adenopathy. Does not bruise/bleed easily.  Psychiatric/Behavioral: Negative for confusion, depression and sleep disturbance. The patient is not nervous/anxious.    PHYSICAL EXAMINATION:  Blood pressure (!) 144/76, pulse 61, temperature 99.1 F (37.3 C), temperature source Temporal, resp. rate 17, height 5' 9.5" (1.765 m), weight 240 lb 9.6 oz (109.1 kg), SpO2 99 %.  ECOG PERFORMANCE STATUS: 0 - Asymptomatic  Physical Exam  Constitutional: Oriented to person, place, and time and well-developed, well-nourished, and in no distress.  HENT:  Head: Normocephalic and atraumatic.  Mouth/Throat: Oropharynx is clear and moist. No oropharyngeal exudate.  Eyes: Conjunctivae are normal. Right eye exhibits no discharge. Left eye exhibits no discharge. No scleral icterus.  Neck: Normal range of motion. Neck supple.  Cardiovascular: Normal rate, regular rhythm, normal heart sounds and intact distal pulses.   Pulmonary/Chest: Effort normal and breath sounds normal. No respiratory distress. No wheezes. No rales.  Abdominal: Soft. Bowel sounds are normal. Exhibits no distension and no mass. There is no tenderness.  Musculoskeletal: Normal range of motion. Exhibits no edema.  Lymphadenopathy:    No cervical adenopathy.  Neurological: Alert and oriented to person, place, and time. Exhibits normal muscle tone. Gait normal. Coordination normal.  Skin: Skin is warm and dry. No rash noted. Not diaphoretic. No erythema. No  pallor.  Psychiatric: Mood, memory and judgment normal.  Vitals reviewed.  LABORATORY DATA: Lab Results  Component Value Date   WBC 2.5 (L) 03/21/2020   HGB 12.3 (L) 03/21/2020   HCT 36.9 (L) 03/21/2020   MCV 95.8 03/21/2020   PLT 95 (L) 03/21/2020      Chemistry      Component Value Date/Time   NA 140 03/08/2020 1244   K 4.8 03/08/2020 1244   CL 108 03/08/2020 1244   CO2 24 03/08/2020 1244   BUN 15 03/08/2020 1244   CREATININE 1.31 (H) 03/08/2020 1244      Component Value Date/Time   CALCIUM 8.7 (L) 03/08/2020 1244   ALKPHOS 56 03/08/2020 1244   AST 34 03/08/2020 1244   ALT 53 (H) 03/08/2020 1244   BILITOT 2.2 (H) 03/08/2020 1244       RADIOGRAPHIC STUDIES:  No results found.   ASSESSMENT/PLAN:  This is a very pleasant 64 year old Caucasian male referred to the clinic for evaluation of intermittent pancytopenia.  The patient was seen with Dr. Julien Nordmann.  The patient recently had a bone marrow biopsy and aspirate performed to further evaluate his condition.  The bone marrow biopsy and aspirate did not show any evidence of any bone marrow abnormality. No monoclonal B-cell or phenotypically aberrant T-cell population was identified.  FISH testing is still pending for myelodysplasia.   Dr. Julien Nordmann believes the patient's pancytopenia is likely secondary to bone marrow suppression secondary to his medications, such as valtrex. The patient is asymptomatic and denies any frequent infections, bleeding abnormalities, or symptomatic anemia.   Dr. Julien Nordmann recommends that the patient continue on observation. He will need his blood count monitored occasionally which can be performed through routine blood work at his PCP's office.   We will see the patient on an as needed basis going forward but would be happy to see the patient, if needed, in the future.   The patient was advised to call immediately if he has any concerning symptoms in the interval. The patient voices understanding  of current disease status and treatment options and is in agreement with the current care plan. All questions were answered. The patient knows to call the clinic with any problems, questions or concerns. We can certainly see the patient much sooner if necessary     No orders of the defined types were placed in this encounter.    Carlton Buskey L Lorriann Hansmann, PA-C 03/29/20  ADDENDUM: Hematology/Oncology Attending: I had a face-to-face encounter with the patient today.  I recommended his care plan.  This is a very pleasant 64 years old white male presented for evaluation of pancytopenia.  This was felt to be drug induced from his current treatment with Valtrex.  The patient had several studies performed recently for evaluation of his pancytopenia that were unremarkable.  He also had a bone marrow biopsy and aspirate performed recently.  Is here today for evaluation and discussion of his biopsy results.  The biopsy showed normocellular marrow with no concerning abnormalities for myelo dysplastic or myeloproliferative disorder. I recommended for the patient to continue his routine follow-up visit and evaluation by his primary care physician.  They will need to monitor his blood count closely while he is on treatment with Valtrex. We will see the patient on as-needed basis at this point. He was advised to call immediately if he has any concerning symptoms in the interval.  Disclaimer: This note was dictated with voice recognition software. Similar sounding words can inadvertently be transcribed and may be missed upon review. Eilleen Kempf, MD 03/29/20

## 2020-03-29 ENCOUNTER — Encounter: Payer: Self-pay | Admitting: Physician Assistant

## 2020-03-29 ENCOUNTER — Other Ambulatory Visit: Payer: Self-pay

## 2020-03-29 ENCOUNTER — Inpatient Hospital Stay: Payer: Medicare Other | Admitting: Physician Assistant

## 2020-03-29 VITALS — BP 144/76 | HR 61 | Temp 99.1°F | Resp 17 | Ht 69.5 in | Wt 240.6 lb

## 2020-03-29 DIAGNOSIS — D61818 Other pancytopenia: Secondary | ICD-10-CM | POA: Insufficient documentation

## 2020-03-29 DIAGNOSIS — D7589 Other specified diseases of blood and blood-forming organs: Secondary | ICD-10-CM

## 2020-03-30 ENCOUNTER — Encounter (HOSPITAL_COMMUNITY): Payer: Self-pay | Admitting: Internal Medicine

## 2020-07-31 ENCOUNTER — Other Ambulatory Visit: Payer: Self-pay

## 2020-07-31 ENCOUNTER — Encounter: Payer: Self-pay | Admitting: Internal Medicine

## 2020-07-31 ENCOUNTER — Ambulatory Visit: Payer: Medicare Other | Admitting: Internal Medicine

## 2020-07-31 VITALS — BP 118/58 | HR 51 | Ht 69.5 in | Wt 233.6 lb

## 2020-07-31 DIAGNOSIS — I700 Atherosclerosis of aorta: Secondary | ICD-10-CM | POA: Insufficient documentation

## 2020-07-31 DIAGNOSIS — I209 Angina pectoris, unspecified: Secondary | ICD-10-CM | POA: Diagnosis not present

## 2020-07-31 DIAGNOSIS — R079 Chest pain, unspecified: Secondary | ICD-10-CM

## 2020-07-31 DIAGNOSIS — I2510 Atherosclerotic heart disease of native coronary artery without angina pectoris: Secondary | ICD-10-CM

## 2020-07-31 DIAGNOSIS — R001 Bradycardia, unspecified: Secondary | ICD-10-CM

## 2020-07-31 DIAGNOSIS — E782 Mixed hyperlipidemia: Secondary | ICD-10-CM

## 2020-07-31 NOTE — Patient Instructions (Signed)
Medication Instructions:  Your physician recommends that you continue on your current medications as directed. Please refer to the Current Medication list given to you today.  *If you need a refill on your cardiac medications before your next appointment, please call your pharmacy*   Lab Work: None  If you have labs (blood work) drawn today and your tests are completely normal, you will receive your results only by: Marland Kitchen MyChart Message (if you have MyChart) OR . A paper copy in the mail If you have any lab test that is abnormal or we need to change your treatment, we will call you to review the results.   Testing/Procedures: Your physician has requested that you have a lexiscan myoview. For further information please visit [email or web address]. Please follow instruction sheet, as given.  Follow-Up: At [facility], you and your health needs are our priority.  As part of our continuing mission to provide you with exceptional heart care, we have created designated Provider Care Teams.  These Care Teams include your primary Cardiologist (physician) and Advanced Practice Providers (APPs -  Physician Assistants and Nurse Practitioners) who all work together to provide you with the care you need, when you need it.  We recommend signing up for the patient portal called "MyChart".  Sign up information is provided on this After Visit Summary.  MyChart is used to connect with patients for Virtual Visits (Telemedicine).  Patients are able to view lab/test results, encounter notes, upcoming appointments, etc.  Non-urgent messages can be sent to your provider as well.   To learn more about what you can do with MyChart, go to [email or web address].    Your next appointment:   6 month(s)  The format for your next appointment:   In Person  Provider:   You may see [name], MD or one of the following Advanced Practice Providers on your designated Care Team:    Melina Copa, PA-C  Ermalinda Barrios, PA-C    Other Instructions None

## 2020-07-31 NOTE — Progress Notes (Signed)
Cardiology Office Note:    Date:  07/31/2020   ID:  Austin Oliver, DOB Dec 18, 1955, MRN [id number]  PCP:  [name], MD  [facility] Cardiologist:  No primary care provider on file.  [facility] Electrophysiologist:  None   Referring MD: [name], *   CC: Chest Pain Seen in consultation for chest pain at the behest of Dr. Claris Gower.  History of Present Illness:    Austin Oliver is a 64 y.o. male with a hx of sinus bradycardia, CAC and ascending aorta atherosclerosis, HTN, and HLD who presents for CP.  Previously seen by Dr. Martinique for pre op cardiac evaluation and distant for chest pain.  Hx of GERD and distant smoker at that time. Has had statin intolerance in the past and started on Zetia.  Patient has had chest pressure in the center of his chest worse with activity. Notes that he has been getting shortness of breath.  Never had chest pain, but had chest pressure in the past.  Help his son move this past weekend.  Tried to climb up on the trailer and have anginal chest pain.  Took a nitroglycerin and felt better.  Also gets better with rest.  Chest pain has worsened and is now present even with gardening or going up stairs.  Possibly getting R knee surgery in the next 6 months.  Unable to walk on a treadmill.  Past Medical History:  Diagnosis Date  . Bradycardia   . Coronary artery calcification seen on CAT scan 01/14/2017  . Family history of thyroid problem   . GERD (gastroesophageal reflux disease)   . History of kidney stones   . Hyperlipidemia   . Hypothyroidism   . Osteoarthritis   . Shingles 2012  . SOB (shortness of breath) on exertion   . Thyroid disease     Past Surgical History:  Procedure Laterality Date  . HEEL SPUR SURGERY Left 80's  . KNEE SURGERY Bilateral    numerous times  . NECK SURGERY  70's  . TOTAL HIP ARTHROPLASTY Left 03/09/2017   Procedure: LEFT TOTAL HIP ARTHROPLASTY ANTERIOR APPROACH;  Surgeon: Rod Can,  MD;  Location: [facility];  Service: Orthopedics;  Laterality: Left;  Dr. requesting RNFA    Current Medications: Current Meds  Medication Sig  . aspirin 81 MG chewable tablet Chew 81 mg by mouth once.  . Coenzyme Q10 (CO Q-10) 100 MG CAPS Take 100 mg by mouth daily with supper. Ultra Co q10  . DULoxetine (CYMBALTA) 30 MG capsule Take 30 mg by mouth daily.  Marland Kitchen ezetimibe (ZETIA) 10 MG tablet Take 1 tablet by mouth daily.  Marland Kitchen ketotifen (ALAWAY) 0.025 % ophthalmic solution Place 1 drop into both eyes 2 (two) times daily as needed (for allergy eyes).  . Levothyroxine Sodium 112 MCG CAPS Take 112 mcg by mouth daily before breakfast.   . loteprednol (LOTEMAX) 0.5 % ophthalmic suspension Place 1 drop into the left eye every other day.   . nitroGLYCERIN (NITROSTAT) 0.3 MG SL tablet Place 0.3 mg under the tongue every 5 (five) minutes as needed for chest pain.  . Omega-3 Fatty Acids (FISH OIL PO) Take 1,300 mg by mouth 2 (two) times daily.  . pantoprazole (PROTONIX) 40 MG tablet Take 1 tablet by mouth daily with supper.  . terazosin (HYTRIN) 5 MG capsule Take 5 mg by mouth at bedtime.  . traMADol (ULTRAM) 50 MG tablet Take 1 tablet by mouth every 6 (six) hours as  needed.  . valACYclovir (VALTREX) 500 MG tablet Take 500 mg by mouth daily.     Allergies:   Bee venom and Cleocin [clindamycin hcl]   Social History   Socioeconomic History  . Marital status: Married    Spouse name: Austin Oliver  . Number of children: 2  . Years of education: Not on file  . Highest education level: Not on file  Occupational History  . Occupation: Animator  Tobacco Use  . Smoking status: Former Smoker    Quit date: 12/05/2016    Years since quitting: 3.6  . Smokeless tobacco: Never Used  Substance and Sexual Activity  . Alcohol use: No  . Drug use: No  . Sexual activity: Not on file  Other Topics Concern  . Not on file  Social History Narrative  . Not on file   Social Determinants of Health   Financial  Resource Strain:   . Difficulty of Paying Living Expenses: Not on file  Food Insecurity:   . Worried About Charity fundraiser in the Last Year: Not on file  . Ran Out of Food in the Last Year: Not on file  Transportation Needs:   . Lack of Transportation (Medical): Not on file  . Lack of Transportation (Non-Medical): Not on file  Physical Activity:   . Days of Exercise per Week: Not on file  . Minutes of Exercise per Session: Not on file  Stress:   . Feeling of Stress : Not on file  Social Connections:   . Frequency of Communication with Friends and Family: Not on file  . Frequency of Social Gatherings with Friends and Family: Not on file  . Attends Religious Services: Not on file  . Active Member of Clubs or Organizations: Not on file  . Attends Archivist Meetings: Not on file  . Marital Status: Not on file     Family History: The patient's family history includes COPD in his father; Dementia in his mother; Heart Problems in his father; Lung cancer in his father. No heart attacks.  ROS:   Please see the history of present illness.    All other systems reviewed and are negative.  EKGs/Labs/Other Studies Reviewed:    The following studies were reviewed today:  EKG:  EKG is ordered today.  The ekg ordered today demonstrates sinus bradycardia rate of 51, with possible anterior q waves  OSH EKG 07/16/20:  Sinus bradycardia rate of 48 without ST/T changes or q waves.  Recent Labs: 03/08/2020: ALT 53; BUN 15; Creatinine 1.31; Potassium 4.8; Sodium 140 03/21/2020: Hemoglobin 12.3; Platelet Count 95  BNP 62.6 AST/ALT 46/8 Iron 81, Ferritin 258  A1c: 6.9 01/16/2017 Lexiscan showing no ischemia 02/04/2017 Showing normal LV and RV function Low dose Screening CT Chest with diffuse coronary calcium and ascending aorta calcium  Physical Exam:    VS:  BP (!) 118/58   Pulse (!) 51   Ht 5' 9.5" (1.765 m)   Wt 233 lb 9.6 oz (106 kg)   SpO2 96%   BMI 34.00 kg/m     Wt  Readings from Last 3 Encounters:  07/31/20 233 lb 9.6 oz (106 kg)  03/29/20 240 lb 9.6 oz (109.1 kg)  03/08/20 236 lb 4.8 oz (107.2 kg)     GEN: Well nourished, well developed in no acute distress HEENT: Normal NECK: No JVD; No carotid bruits LYMPHATICS: No lymphadenopathy CARDIAC: RRR, no murmurs, rubs, gallops RESPIRATORY:  Clear to auscultation without rales, wheezing or rhonchi  ABDOMEN: Soft, non-tender, non-distended MUSCULOSKELETAL:  No edema; No deformity  SKIN: Warm and dry NEUROLOGIC:  Alert and oriented x 3 PSYCHIATRIC:  Normal affect   ASSESSMENT:    1. Sinus bradycardia   2. Coronary artery calcification seen on CAT scan   3. Atherosclerosis of aorta ([place])   4. Mixed hyperlipidemia    PLAN:    In order of problems listed above:  1. Anginal Chest Pain 2. Hypertension 3. Hyperlipidemia 4. Former Tobacco Use 5. Sinus bradycardia 6. Coronary and aortic calcifications 7. Arthritis and limited mobility - Anginal chest pain/pressure with risk factors and significant coronary calcification; would recommend repeat Lexiscan  - SHARED DECISION MAKING:  Discussed the risk and benefits of proceeding straight to LCP vs Stress with patient and wife (father had LCP for heart valve surgery prior); will attempt stress first - continue ASA, Zetia; (Statin intolerant) BB intolerant with sinus bradycardia  6 months F/u  Medication Adjustments/Labs and Tests Ordered: Current medicines are reviewed at length with the patient today.  Concerns regarding medicines are outlined above.  No orders of the defined types were placed in this encounter.  No orders of the defined types were placed in this encounter.   There are no Patient Instructions on file for this visit.   Signed, [name], MD  07/31/2020 8:50 AM    [facility]

## 2020-08-02 ENCOUNTER — Telehealth (HOSPITAL_COMMUNITY): Payer: Self-pay | Admitting: *Deleted

## 2020-08-02 NOTE — Telephone Encounter (Signed)
Left message on voicemail per DPR in reference to upcoming appointment scheduled on 08/06/20 at 10:30 with detailed instructions given per Myocardial Perfusion Study Information Sheet for the test. LM to arrive 15 minutes early, and that it is imperative to arrive on time for appointment to keep from having the test rescheduled. If you need to cancel or reschedule your appointment, please call the office within 24 hours of your appointment. Failure to do so may result in a cancellation of your appointment, and a $50 no show fee. Phone number given for call back for any questions.

## 2020-08-06 ENCOUNTER — Other Ambulatory Visit: Payer: Self-pay

## 2020-08-06 ENCOUNTER — Ambulatory Visit (HOSPITAL_COMMUNITY): Payer: Medicare Other | Attending: Cardiology

## 2020-08-06 DIAGNOSIS — E782 Mixed hyperlipidemia: Secondary | ICD-10-CM | POA: Diagnosis not present

## 2020-08-06 DIAGNOSIS — I209 Angina pectoris, unspecified: Secondary | ICD-10-CM

## 2020-08-06 DIAGNOSIS — R001 Bradycardia, unspecified: Secondary | ICD-10-CM

## 2020-08-06 DIAGNOSIS — I700 Atherosclerosis of aorta: Secondary | ICD-10-CM | POA: Insufficient documentation

## 2020-08-06 DIAGNOSIS — I2510 Atherosclerotic heart disease of native coronary artery without angina pectoris: Secondary | ICD-10-CM | POA: Diagnosis present

## 2020-08-06 LAB — MYOCARDIAL PERFUSION IMAGING
LV dias vol: 160 mL (ref 62–150)
LV sys vol: 68 mL
Peak HR: 62 {beats}/min
Rest HR: 42 {beats}/min
SDS: 7
SRS: 1
SSS: 8
TID: 0.97

## 2020-08-06 MED ORDER — REGADENOSON 0.4 MG/5ML IV SOLN
0.4000 mg | Freq: Once | INTRAVENOUS | Status: AC
  Administered 2020-08-06: 0.4 mg via INTRAVENOUS

## 2020-08-06 MED ORDER — TECHNETIUM TC 99M TETROFOSMIN IV KIT
10.5000 | PACK | Freq: Once | INTRAVENOUS | Status: AC | PRN
  Administered 2020-08-06: 10.5 via INTRAVENOUS
  Filled 2020-08-06: qty 11

## 2020-08-06 MED ORDER — TECHNETIUM TC 99M TETROFOSMIN IV KIT
31.3000 | PACK | Freq: Once | INTRAVENOUS | Status: AC | PRN
  Administered 2020-08-06: 31.3 via INTRAVENOUS
  Filled 2020-08-06: qty 32

## 2020-08-14 ENCOUNTER — Other Ambulatory Visit: Payer: Self-pay | Admitting: Family Medicine

## 2020-08-14 DIAGNOSIS — Z87891 Personal history of nicotine dependence: Secondary | ICD-10-CM

## 2020-08-21 ENCOUNTER — Encounter: Payer: Self-pay | Admitting: Cardiology

## 2020-08-21 ENCOUNTER — Ambulatory Visit: Payer: Medicare Other | Admitting: Medical

## 2020-08-21 ENCOUNTER — Other Ambulatory Visit: Payer: Self-pay

## 2020-08-21 ENCOUNTER — Telehealth: Payer: Self-pay | Admitting: Internal Medicine

## 2020-08-21 VITALS — BP 140/78 | HR 48 | Ht 69.0 in | Wt 235.4 lb

## 2020-08-21 DIAGNOSIS — I200 Unstable angina: Secondary | ICD-10-CM | POA: Diagnosis not present

## 2020-08-21 DIAGNOSIS — E785 Hyperlipidemia, unspecified: Secondary | ICD-10-CM

## 2020-08-21 DIAGNOSIS — I10 Essential (primary) hypertension: Secondary | ICD-10-CM | POA: Diagnosis not present

## 2020-08-21 DIAGNOSIS — R001 Bradycardia, unspecified: Secondary | ICD-10-CM | POA: Diagnosis not present

## 2020-08-21 MED ORDER — ISOSORBIDE MONONITRATE ER 60 MG PO TB24: 90.0000 mg | ORAL_TABLET | Freq: Every day | ORAL | 3 refills | Status: DC

## 2020-08-21 NOTE — Telephone Encounter (Signed)
Spoke with the patient who states that he woke up this morning with chest pain that he describes as sharp pain across his chest. Reports that episodes of chest pain that he has experienced in the past resolve with rest, however this one was worse and did not resolve.  He states that he also had SOB and some diaphoresis. He took 1 nitroglycerin and called EMS. He states that nitroglycerin did give him some relief. EMS did EKG which he states he thinks it noted a small abnormality. BP was 168/92, HR 71 and O2 97%. He also notes that his blood sugar was 256. He reports that EMS advised him to report to the ER if he could not be seen by cardiology today. He reports that he currently is not having chest pain only some dull pressure.  I have scheduled him an appointment with [facility] today however patient aware if symptoms return then he will need to go to the ER.

## 2020-08-21 NOTE — H&P (View-Only) (Signed)
Cardiology Office Note  Date:  08/21/2020   ID:  Austin Oliver, Austin Oliver 06-01-1956, MRN [id number]  PCP:  [name], MD  Cardiologist: Dr. [name]  _____________  Chest pain, SOB  _____________   History of Present Illness: Austin Oliver is a 64 y.o. male with pmh of sinus bradycardia, coronary artery calcification and ascending aorta atherosclerosis seen on CT, HTN, HLD with statin intolerance on Zetia, GERD, remote tobacco use who presents for chest pain. Previously seen by Dr. Martinique for pre-op eval and was having chest pain. Nuclear stress test was low risk, LVEF 30-45%, no ischemia. Echo showed EF 60-65%, mild LVH, no WMA, G1DD. Holter monitor at that time showed SB with rare PACs and PAC couplets.   He was recently seen 07/31/20 for chest pain. Nuclear stress test obtained which was normal with no prior infarct or ischemia, EF 57%.   Patient called the office today to report an episode of chest pain EMS was called who recommended ER evaluation however pain resolved. Patient called cardiology and was scheduled to be seen in the office.   Today, he reports chest pain woke him up this morning at 5:30AM. It was substernal and sharp in nature. It was 6/10. He had associated sob and diaphoresis. No nasuea or vomiting. About 30 minutes later he was still having chest pain so he took a NTGx1 which improved pain. EMS was called who reported higher BP 167/76. They recommended ER eval however patient was not wanting to wait in the ER. Today EKG shows SB with some minimal ST depression lateral leads. He is currently chest pain free. He has been taking Imdur as well without relief. Overall he feels symptoms are becoming more frequent. He has been taking SL nitro daily. He denies symptoms of palpitations, shortness of breath, orthopnea, PND, lower extremity edema, claudication, dizziness, presyncope, syncope, bleeding, or neurologic sequela. The patient is tolerating medications without  difficulties and is otherwise without complaint today.     Past Medical History:  Diagnosis Date  . Bradycardia   . Coronary artery calcification seen on CAT scan 01/14/2017  . Family history of thyroid problem   . GERD (gastroesophageal reflux disease)   . History of kidney stones   . Hyperlipidemia   . Hypothyroidism   . Osteoarthritis   . Shingles 2012  . SOB (shortness of breath) on exertion   . Thyroid disease    Past Surgical History:  Procedure Laterality Date  . HEEL SPUR SURGERY Left 80's  . KNEE SURGERY Bilateral    numerous times  . NECK SURGERY  70's  . TOTAL HIP ARTHROPLASTY Left 03/09/2017   Procedure: LEFT TOTAL HIP ARTHROPLASTY ANTERIOR APPROACH;  Surgeon: Rod Can, MD;  Location: [place];  Service: Orthopedics;  Laterality: Left;  Dr. requesting RNFA   _____________  Current Outpatient Medications  Medication Sig Dispense Refill  . aspirin 81 MG chewable tablet Chew 81 mg by mouth once.    . Coenzyme Q10 (CO Q-10) 100 MG CAPS Take 100 mg by mouth daily with supper. Ultra Co q10    . DULoxetine (CYMBALTA) 30 MG capsule Take 30 mg by mouth daily.    Marland Kitchen ezetimibe (ZETIA) 10 MG tablet Take 1 tablet by mouth daily.    Marland Kitchen ketotifen (ALAWAY) 0.025 % ophthalmic solution Place 1 drop into both eyes 2 (two) times daily as needed (for allergy eyes).    . Levothyroxine Sodium 112 MCG CAPS Take 112 mcg by mouth daily before  breakfast.     . loteprednol (LOTEMAX) 0.5 % ophthalmic suspension Place 1 drop into the left eye every other day.     . nitroGLYCERIN (NITROSTAT) 0.3 MG SL tablet Place 0.3 mg under the tongue every 5 (five) minutes as needed for chest pain.    . Omega-3 Fatty Acids (FISH OIL PO) Take 1,300 mg by mouth 2 (two) times daily.    . pantoprazole (PROTONIX) 40 MG tablet Take 1 tablet by mouth daily with supper.    . terazosin (HYTRIN) 5 MG capsule Take 5 mg by mouth at bedtime.    . traMADol (ULTRAM) 50 MG tablet Take 1 tablet by mouth every 6 (six) hours  as needed.    . valACYclovir (VALTREX) 500 MG tablet Take 500 mg by mouth daily.     No current facility-administered medications for this visit.   _____________   Allergies:   Bee venom and Cleocin [clindamycin hcl]  _____________   Social History:  The patient  reports that he quit smoking about 3 years ago. He has never used smokeless tobacco. He reports that he does not drink alcohol and does not use drugs.  _____________   Family History:  The patient's family history includes COPD in his father; Dementia in his mother; Heart Problems in his father; Lung cancer in his father.  _____________   ROS:  Please see the history of present illness.   Positive for chest pain,   All other systems are reviewed and negative.  _____________   PHYSICAL EXAM: VS:  There were no vitals taken for this visit. , BMI There is no height or weight on file to calculate BMI. GEN: Well nourished, well developed, in no acute distress  HEENT: normal  Neck: no JVD, carotid bruits, or masses Cardiac: RR, bradycardia; no murmurs, rubs, or gallops. No clubbing, cyanosis, edema.  Radials/DP/PT 2+ and equal bilaterally.  Respiratory:  clear to auscultation bilaterally, normal work of breathing GI: soft, nontender, nondistended, + BS MS: no deformity or atrophy  Skin: warm and dry, no rash Neuro:  Strength and sensation are intact Psych: euthymic mood, full affect _____________  EKG:   The ekg ordered today shows SB, 48bpm, minimal ST depression lateral leads, nonspecific T wave changes  Recent Labs: 03/08/2020: ALT 53; BUN 15; Creatinine 1.31; Potassium 4.8; Sodium 140 03/21/2020: Hemoglobin 12.3; Platelet Count 95  No results found for requested labs within last 8760 hours.  CrCl cannot be calculated (Patient's most recent lab result is older than the maximum 21 days allowed.).  Wt Readings from Last 3 Encounters:  08/06/20 233 lb (105.7 kg)  07/31/20 233 lb 9.6 oz (106 kg)  03/29/20 240 lb 9.6 oz  (109.1 kg)     Lexiscan Myoview 07/2020  Nuclear stress EF: 57%.  There was no ST segment deviation noted during stress.  The study is normal.  This is a low risk study.  The left ventricular ejection fraction is normal (55-65%).   Normal pharmacologic nuclear stress test with no evidence for prior infarct or ischemia. LVEF 57%. Left ventricle appears mildly dilated.  Echo 02/04/2017 Study Conclusions   - Left ventricle: The cavity size was normal. Wall thickness was  increased in a pattern of mild LVH. Systolic function was normal.  The estimated ejection fraction was in the range of 60% to 65%.  Wall motion was normal; there were no regional wall motion  abnormalities. Doppler parameters are consistent with abnormal  left ventricular relaxation (grade 1 diastolic dysfunction).  The  E/e&' ratio is between 8-15, suggesting indeterminate LV filling  pressure.  - Left atrium: The atrium was normal in size.  - Inferior vena cava: The vessel was normal in size. The  respirophasic diameter changes were in the normal range (>= 50%),  consistent with normal central venous pressure.   Impressions:   - LVEF 60-65%, mild LVH, diastolic dysfunction, indeterminate LV  filling pressure, normal LA size, normal IVC.   Holter monitor 2018 He does have sinus bradycardia with average HR of 51. In the absence of symptoms no further treatment is needed.  _____________   ASSESSMENT AND PLAN:  Unstable Angina - Recently seen for CP. Lexiscan 9/20 was normal with EF 57%, low risk, no prior infarct or ischemia - Returns with chest pain that woke him up, felt worse than prior episodes. Pain improved with SL nitro. EMS was called who reported elevated BP and recommended ER eval however patient came to office - EKG shows SB with minimal ST depression in the lateral leads - CT chest from 2018 shows coronary artery calcifications. Also has RF HTN and HLD - Case reviewed with  DOD. Will set up for cath for the soonest available appointment. Will increase Imdur to 90mg  daily. BMET today to check kidney function Risks and benefits of cardiac catheterization have been discussed with the patient.  These include bleeding, infection, kidney damage, stroke, heart attack, death.  The patient understands these risks and is willing to proceed. - continue aspirin  HLD - history of statin intolerance on Zetia - Will need FLP  HTN - Has been high recently. BP here 140/78 - Increase Imdur as above - No BB for baseline bradycardia - Can also consider addition of amlodipine if still high  Sinus bradycardia - seen on holter monitor in 2018 - No BB for SB _____________    Disposition:   FU with with MD/APP after cath   Signed, Mica Ramdass Ninfa Meeker, PA-C 08/21/2020 11:04 AM    _____________ [facility] [address] [place] [address]  [phone] (office) [phone] (fax)

## 2020-08-21 NOTE — Progress Notes (Signed)
Cardiology Office Note  Date:  08/21/2020   ID:  Shameek, Nyquist [birth year], MRN [id number]  PCP:  [name], MD  Cardiologist: Dr. [name]  _____________  Chest pain, SOB  _____________   History of Present Illness: WISTER HOEFLE is a 64 y.o. male with pmh of sinus bradycardia, coronary artery calcification and ascending aorta atherosclerosis seen on CT, HTN, HLD with statin intolerance on Zetia, GERD, remote tobacco use who presents for chest pain. Previously seen by Dr. Martinique for pre-op eval and was having chest pain. Nuclear stress test was low risk, LVEF 30-45%, no ischemia. Echo showed EF 60-65%, mild LVH, no WMA, G1DD. Holter monitor at that time showed SB with rare PACs and PAC couplets.   He was recently seen 07/31/20 for chest pain. Nuclear stress test obtained which was normal with no prior infarct or ischemia, EF 57%.   Patient called the office today to report an episode of chest pain EMS was called who recommended ER evaluation however pain resolved. Patient called cardiology and was scheduled to be seen in the office.   Today, he reports chest pain woke him up this morning at 5:30AM. It was substernal and sharp in nature. It was 6/10. He had associated sob and diaphoresis. No nasuea or vomiting. About 30 minutes later he was still having chest pain so he took a NTGx1 which improved pain. EMS was called who reported higher BP 167/76. They recommended ER eval however patient was not wanting to wait in the ER. Today EKG shows SB with some minimal ST depression lateral leads. He is currently chest pain free. He has been taking Imdur as well without relief. Overall he feels symptoms are becoming more frequent. He has been taking SL nitro daily. He denies symptoms of palpitations, shortness of breath, orthopnea, PND, lower extremity edema, claudication, dizziness, presyncope, syncope, bleeding, or neurologic sequela. The patient is tolerating medications without  difficulties and is otherwise without complaint today.     Past Medical History:  Diagnosis Date  . Bradycardia   . Coronary artery calcification seen on CAT scan 01/14/2017  . Family history of thyroid problem   . GERD (gastroesophageal reflux disease)   . History of kidney stones   . Hyperlipidemia   . Hypothyroidism   . Osteoarthritis   . Shingles 2012  . SOB (shortness of breath) on exertion   . Thyroid disease    Past Surgical History:  Procedure Laterality Date  . HEEL SPUR SURGERY Left 80's  . KNEE SURGERY Bilateral    numerous times  . NECK SURGERY  70's  . TOTAL HIP ARTHROPLASTY Left 03/09/2017   Procedure: LEFT TOTAL HIP ARTHROPLASTY ANTERIOR APPROACH;  Surgeon: Rod Can, MD;  Location: [place];  Service: Orthopedics;  Laterality: Left;  Dr. requesting RNFA   _____________  Current Outpatient Medications  Medication Sig Dispense Refill  . aspirin 81 MG chewable tablet Chew 81 mg by mouth once.    . Coenzyme Q10 (CO Q-10) 100 MG CAPS Take 100 mg by mouth daily with supper. Ultra Co q10    . DULoxetine (CYMBALTA) 30 MG capsule Take 30 mg by mouth daily.    Marland Kitchen ezetimibe (ZETIA) 10 MG tablet Take 1 tablet by mouth daily.    Marland Kitchen ketotifen (ALAWAY) 0.025 % ophthalmic solution Place 1 drop into both eyes 2 (two) times daily as needed (for allergy eyes).    . Levothyroxine Sodium 112 MCG CAPS Take 112 mcg by mouth daily before  breakfast.     . loteprednol (LOTEMAX) 0.5 % ophthalmic suspension Place 1 drop into the left eye every other day.     . nitroGLYCERIN (NITROSTAT) 0.3 MG SL tablet Place 0.3 mg under the tongue every 5 (five) minutes as needed for chest pain.    . Omega-3 Fatty Acids (FISH OIL PO) Take 1,300 mg by mouth 2 (two) times daily.    . pantoprazole (PROTONIX) 40 MG tablet Take 1 tablet by mouth daily with supper.    . terazosin (HYTRIN) 5 MG capsule Take 5 mg by mouth at bedtime.    . traMADol (ULTRAM) 50 MG tablet Take 1 tablet by mouth every 6 (six) hours  as needed.    . valACYclovir (VALTREX) 500 MG tablet Take 500 mg by mouth daily.     No current facility-administered medications for this visit.   _____________   Allergies:   Bee venom and Cleocin [clindamycin hcl]  _____________   Social History:  The patient  reports that he quit smoking about 3 years ago. He has never used smokeless tobacco. He reports that he does not drink alcohol and does not use drugs.  _____________   Family History:  The patient's family history includes COPD in his father; Dementia in his mother; Heart Problems in his father; Lung cancer in his father.  _____________   ROS:  Please see the history of present illness.   Positive for chest pain,   All other systems are reviewed and negative.  _____________   PHYSICAL EXAM: VS:  There were no vitals taken for this visit. , BMI There is no height or weight on file to calculate BMI. GEN: Well nourished, well developed, in no acute distress  HEENT: normal  Neck: no JVD, carotid bruits, or masses Cardiac: RR, bradycardia; no murmurs, rubs, or gallops. No clubbing, cyanosis, edema.  Radials/DP/PT 2+ and equal bilaterally.  Respiratory:  clear to auscultation bilaterally, normal work of breathing GI: soft, nontender, nondistended, + BS MS: no deformity or atrophy  Skin: warm and dry, no rash Neuro:  Strength and sensation are intact Psych: euthymic mood, full affect _____________  EKG:   The ekg ordered today shows SB, 48bpm, minimal ST depression lateral leads, nonspecific T wave changes  Recent Labs: 03/08/2020: ALT 53; BUN 15; Creatinine 1.31; Potassium 4.8; Sodium 140 03/21/2020: Hemoglobin 12.3; Platelet Count 95  No results found for requested labs within last 8760 hours.  CrCl cannot be calculated (Patient's most recent lab result is older than the maximum 21 days allowed.).  Wt Readings from Last 3 Encounters:  08/06/20 233 lb (105.7 kg)  07/31/20 233 lb 9.6 oz (106 kg)  03/29/20 240 lb 9.6 oz  (109.1 kg)     Lexiscan Myoview 07/2020  Nuclear stress EF: 57%.  There was no ST segment deviation noted during stress.  The study is normal.  This is a low risk study.  The left ventricular ejection fraction is normal (55-65%).   Normal pharmacologic nuclear stress test with no evidence for prior infarct or ischemia. LVEF 57%. Left ventricle appears mildly dilated.  Echo 02/04/2017 Study Conclusions   - Left ventricle: The cavity size was normal. Wall thickness was  increased in a pattern of mild LVH. Systolic function was normal.  The estimated ejection fraction was in the range of 60% to 65%.  Wall motion was normal; there were no regional wall motion  abnormalities. Doppler parameters are consistent with abnormal  left ventricular relaxation (grade 1 diastolic dysfunction).  The  E/e&' ratio is between 8-15, suggesting indeterminate LV filling  pressure.  - Left atrium: The atrium was normal in size.  - Inferior vena cava: The vessel was normal in size. The  respirophasic diameter changes were in the normal range (>= 50%),  consistent with normal central venous pressure.   Impressions:   - LVEF 60-65%, mild LVH, diastolic dysfunction, indeterminate LV  filling pressure, normal LA size, normal IVC.   Holter monitor 2018 He does have sinus bradycardia with average HR of 51. In the absence of symptoms no further treatment is needed.  _____________   ASSESSMENT AND PLAN:  Unstable Angina - Recently seen for CP. Lexiscan 9/20 was normal with EF 57%, low risk, no prior infarct or ischemia - Returns with chest pain that woke him up, felt worse than prior episodes. Pain improved with SL nitro. EMS was called who reported elevated BP and recommended ER eval however patient came to office - EKG shows SB with minimal ST depression in the lateral leads - CT chest from 2018 shows coronary artery calcifications. Also has RF HTN and HLD - Case reviewed with  DOD. Will set up for cath for the soonest available appointment. Will increase Imdur to 90mg  daily. BMET today to check kidney function Risks and benefits of cardiac catheterization have been discussed with the patient.  These include bleeding, infection, kidney damage, stroke, heart attack, death.  The patient understands these risks and is willing to proceed. - continue aspirin  HLD - history of statin intolerance on Zetia - Will need FLP  HTN - Has been high recently. BP here 140/78 - Increase Imdur as above - No BB for baseline bradycardia - Can also consider addition of amlodipine if still high  Sinus bradycardia - seen on holter monitor in 2018 - No BB for SB _____________    Disposition:   FU with with MD/APP after cath   Signed, Archita Lomeli Ninfa Meeker, PA-C 08/21/2020 11:04 AM    _____________ [facility] [address] [place] [address]  [phone] (office) [phone] (fax)

## 2020-08-21 NOTE — Telephone Encounter (Signed)
Pt c/o of Chest Pain: STAT if CP now or developed within 24 hours  1. Are you having CP right now?   No   2. Are you experiencing any other symptoms (ex. SOB, nausea, vomiting, sweating)?  Sweating a little and a little SOB.    3. How long have you been experiencing CP? The pain in his chest started this morning and woke him up.,  EMS came out and did a EKG and they stated it looked ok  4. Is your CP continuous or coming and going?  Come and goes   5. Have you taken Nitroglycerin? Yes he took it at 9:30ish.  He stated it has calm down since he took it.   ?

## 2020-08-21 NOTE — Patient Instructions (Addendum)
Medication Instructions:   1. Increase the imdur (isosorbide) to 90 mg daily. You will take one and one half of the 60 mg tablets.  *If you need a refill on your cardiac medications before your next appointment, please call your pharmacy*   Lab Work: BMET, CBC and fasting lipid panel tomorrow  If you have labs (blood work) drawn today and your tests are completely normal, you will receive your results only by: Marland Kitchen MyChart Message (if you have MyChart) OR . A paper copy in the mail If you have any lab test that is abnormal or we need to change your treatment, we will call you to review the results.   Testing/Procedures:    [place] OFFICE [place], [address] [place] [address] Dept: [phone] Loc: [phone]  Austin Oliver  08/21/2020  You are scheduled for a Cardiac Catheterization on Friday, October 8 with Dr. Harrell Gave End.  1. Please arrive at the [facility] (Main Entrance A) at [facility]: [place], [place] [address] at 8:30 AM (This time is two hours before your procedure to ensure your preparation). Free valet parking service is available.   Special note: Every effort is made to have your procedure done on time. Please understand that emergencies sometimes delay scheduled procedures.  2. Diet: Do not eat solid foods after midnight.  The patient may have clear liquids until 5am upon the day of the procedure.  3. Labs: You will need to have blood drawn on Wednesday, October 6 at [facility]. [address]  Open: 7:30am - 5pm    Phone: [phone].  4. Medication instructions in preparation for your procedure:   On the morning of your procedure, take your Aspirin and any morning medicines NOT listed above.  You may use sips of water.  5. Plan for one night stay--bring personal belongings. 6. Bring a current  list of your medications and current insurance cards. 7. You MUST have a responsible person to drive you home. 8. Someone MUST be with you the first 24 hours after you arrive home or your discharge will be delayed. 9. Please wear clothes that are easy to get on and off and wear slip-on shoes.  Thank you for allowing [place] to care for you!   -- [facility]    Follow-Up: At [facility], you and your health needs are our priority.  As part of our continuing mission to provide you with exceptional heart care, we have created designated Provider Care Teams.  These Care Teams include your primary Cardiologist (physician) and Advanced Practice Providers (APPs -  Physician Assistants and Nurse Practitioners) who all work together to provide you with the care you need, when you need it.    Your next appointment:   October 26 at 8:00 AM    Provider:   Dr [name].

## 2020-08-22 ENCOUNTER — Other Ambulatory Visit (HOSPITAL_COMMUNITY)
Admission: RE | Admit: 2020-08-22 | Discharge: 2020-08-22 | Disposition: A | Discharge status: Home / Self Care | Payer: Medicare Other | Source: Ambulatory Visit | Attending: Internal Medicine | Admitting: Internal Medicine

## 2020-08-22 ENCOUNTER — Other Ambulatory Visit: Payer: Medicare Other | Admitting: *Deleted

## 2020-08-22 DIAGNOSIS — Z20822 Contact with and (suspected) exposure to covid-19: Secondary | ICD-10-CM | POA: Insufficient documentation

## 2020-08-22 DIAGNOSIS — I10 Essential (primary) hypertension: Secondary | ICD-10-CM

## 2020-08-22 DIAGNOSIS — Z01812 Encounter for preprocedural laboratory examination: Secondary | ICD-10-CM | POA: Diagnosis present

## 2020-08-22 DIAGNOSIS — E785 Hyperlipidemia, unspecified: Secondary | ICD-10-CM

## 2020-08-22 DIAGNOSIS — I200 Unstable angina: Secondary | ICD-10-CM

## 2020-08-22 DIAGNOSIS — R001 Bradycardia, unspecified: Secondary | ICD-10-CM

## 2020-08-22 LAB — BASIC METABOLIC PANEL
BUN/Creatinine Ratio: 19 (ref 10–24)
BUN: 22 mg/dL (ref 8–27)
CO2: 23 mmol/L (ref 20–29)
Calcium: 9.2 mg/dL (ref 8.6–10.2)
Chloride: 105 mmol/L (ref 96–106)
Creatinine, Ser: 1.18 mg/dL (ref 0.76–1.27)
GFR calc Af Amer: 74 mL/min/{1.73_m2} (ref >59)
GFR calc non Af Amer: 64 mL/min/{1.73_m2} (ref >59)
Glucose: 156 mg/dL — ABNORMAL HIGH (ref 65–99)
Potassium: 4.2 mmol/L (ref 3.5–5.2)
Sodium: 141 mmol/L (ref 134–144)

## 2020-08-22 LAB — CBC
Hematocrit: 35.7 % — ABNORMAL LOW (ref 37.5–51.0)
Hemoglobin: 12 g/dL — ABNORMAL LOW (ref 13.0–17.7)
MCH: 30.5 pg (ref 26.6–33.0)
MCHC: 33.6 g/dL (ref 31.5–35.7)
MCV: 91 fL (ref 79–97)
Platelets: 136 10*3/uL — ABNORMAL LOW (ref 150–450)
RBC: 3.94 x10E6/uL — ABNORMAL LOW (ref 4.14–5.80)
RDW: 12.9 % (ref 11.6–15.4)
WBC: 6 10*3/uL (ref 3.4–10.8)

## 2020-08-22 LAB — LIPID PANEL
Chol/HDL Ratio: 4.5 ratio (ref 0.0–5.0)
Cholesterol, Total: 158 mg/dL (ref 100–199)
HDL: 35 mg/dL — ABNORMAL LOW (ref >39)
LDL Chol Calc (NIH): 106 mg/dL — ABNORMAL HIGH (ref 0–99)
Triglycerides: 88 mg/dL (ref 0–149)
VLDL Cholesterol Cal: 17 mg/dL (ref 5–40)

## 2020-08-22 LAB — SARS CORONAVIRUS 2 (TAT 6-24 HRS): SARS Coronavirus 2: NEGATIVE

## 2020-08-23 ENCOUNTER — Telehealth: Payer: Self-pay | Admitting: *Deleted

## 2020-08-23 ENCOUNTER — Telehealth: Payer: Self-pay | Admitting: Medical

## 2020-08-23 NOTE — Telephone Encounter (Addendum)
Pt contacted pre-catheterization scheduled at [facility] for: Friday August 24, 2020 10:30 AM Verified arrival time and place: [place] [facility]) at: 8:30 AM   No solid food after midnight prior to cath, clear liquids until 5 AM day of procedure.   AM meds can be  taken pre-cath with sips of water including: ASA 81 mg   Confirmed patient has responsible adult to drive home post procedure and be with patient first 24 hours after arriving home: yes  You are allowed ONE visitor in the waiting room during the time you are at the hospital for your procedure. Both you and your visitor must wear a mask once you enter the hospital.       COVID-19 Pre-Screening Questions:  . In the past 14 days have you had a new cough, new headache, new nasal congestion, fever (100.4 or greater) unexplained body aches, new sore throat, or sudden loss of taste or sense of smell? No  . In the past 14 days have you been around anyone with known Covid 19? No . Have you been vaccinated for COVID-19? Yes, see immunization history   Reviewed procedure/mask/visitor instructions, COVID-19 questions with patient.

## 2020-08-23 NOTE — Telephone Encounter (Signed)
Patient returned call for his lab results.  

## 2020-08-24 ENCOUNTER — Encounter (HOSPITAL_COMMUNITY)
Admission: RE | Disposition: A | Discharge status: Home / Self Care | Payer: Self-pay | Source: Home / Self Care | Attending: Internal Medicine

## 2020-08-24 ENCOUNTER — Ambulatory Visit (HOSPITAL_COMMUNITY)
Admission: RE | Admit: 2020-08-24 | Discharge: 2020-08-24 | Disposition: A | Discharge status: Home / Self Care | Payer: Medicare Other | Attending: Internal Medicine | Admitting: Internal Medicine

## 2020-08-24 ENCOUNTER — Other Ambulatory Visit: Payer: Self-pay

## 2020-08-24 DIAGNOSIS — Z96642 Presence of left artificial hip joint: Secondary | ICD-10-CM | POA: Diagnosis not present

## 2020-08-24 DIAGNOSIS — I200 Unstable angina: Secondary | ICD-10-CM | POA: Diagnosis present

## 2020-08-24 DIAGNOSIS — E785 Hyperlipidemia, unspecified: Secondary | ICD-10-CM | POA: Diagnosis not present

## 2020-08-24 DIAGNOSIS — Z87891 Personal history of nicotine dependence: Secondary | ICD-10-CM | POA: Diagnosis not present

## 2020-08-24 DIAGNOSIS — K219 Gastro-esophageal reflux disease without esophagitis: Secondary | ICD-10-CM | POA: Diagnosis not present

## 2020-08-24 DIAGNOSIS — E039 Hypothyroidism, unspecified: Secondary | ICD-10-CM | POA: Diagnosis not present

## 2020-08-24 DIAGNOSIS — R001 Bradycardia, unspecified: Secondary | ICD-10-CM | POA: Diagnosis not present

## 2020-08-24 DIAGNOSIS — Z8249 Family history of ischemic heart disease and other diseases of the circulatory system: Secondary | ICD-10-CM | POA: Diagnosis not present

## 2020-08-24 DIAGNOSIS — Z79899 Other long term (current) drug therapy: Secondary | ICD-10-CM | POA: Diagnosis not present

## 2020-08-24 DIAGNOSIS — Z7982 Long term (current) use of aspirin: Secondary | ICD-10-CM | POA: Diagnosis not present

## 2020-08-24 DIAGNOSIS — E782 Mixed hyperlipidemia: Secondary | ICD-10-CM

## 2020-08-24 DIAGNOSIS — I25110 Atherosclerotic heart disease of native coronary artery with unstable angina pectoris: Secondary | ICD-10-CM

## 2020-08-24 DIAGNOSIS — Z8619 Personal history of other infectious and parasitic diseases: Secondary | ICD-10-CM | POA: Insufficient documentation

## 2020-08-24 DIAGNOSIS — I2584 Coronary atherosclerosis due to calcified coronary lesion: Secondary | ICD-10-CM | POA: Insufficient documentation

## 2020-08-24 DIAGNOSIS — Z881 Allergy status to other antibiotic agents status: Secondary | ICD-10-CM | POA: Insufficient documentation

## 2020-08-24 DIAGNOSIS — M1990 Unspecified osteoarthritis, unspecified site: Secondary | ICD-10-CM | POA: Insufficient documentation

## 2020-08-24 DIAGNOSIS — I10 Essential (primary) hypertension: Secondary | ICD-10-CM | POA: Insufficient documentation

## 2020-08-24 DIAGNOSIS — Z955 Presence of coronary angioplasty implant and graft: Secondary | ICD-10-CM

## 2020-08-24 HISTORY — PX: LEFT HEART CATH AND CORONARY ANGIOGRAPHY: CATH118249

## 2020-08-24 HISTORY — PX: CORONARY STENT INTERVENTION: CATH118234

## 2020-08-24 HISTORY — PX: INTRAVASCULAR ULTRASOUND/IVUS: CATH118244

## 2020-08-24 LAB — POCT ACTIVATED CLOTTING TIME
Activated Clotting Time: 235 seconds
Activated Clotting Time: 263 seconds
Activated Clotting Time: 285 seconds

## 2020-08-24 SURGERY — LEFT HEART CATH AND CORONARY ANGIOGRAPHY
Anesthesia: LOCAL

## 2020-08-24 MED ORDER — SODIUM CHLORIDE 0.9 % WEIGHT BASED INFUSION: 1.0000 mL/kg/h | INTRAVENOUS | Status: DC

## 2020-08-24 MED ORDER — SODIUM CHLORIDE 0.9% FLUSH: 3.0000 mL | Freq: Two times a day (BID) | INTRAVENOUS | Status: DC

## 2020-08-24 MED ORDER — HEPARIN (PORCINE) IN NACL 1000-0.9 UT/500ML-% IV SOLN
INTRAVENOUS | Status: DC | PRN
  Administered 2020-08-24 (×2): 500 mL

## 2020-08-24 MED ORDER — TICAGRELOR 90 MG PO TABS
ORAL_TABLET | ORAL | Status: AC
  Filled 2020-08-24: qty 1

## 2020-08-24 MED ORDER — NITROGLYCERIN 0.4 MG SL SUBL
0.4000 mg | SUBLINGUAL_TABLET | SUBLINGUAL | Status: DC | PRN
  Administered 2020-08-24: 0.4 mg via SUBLINGUAL
  Filled 2020-08-24: qty 1

## 2020-08-24 MED ORDER — MIDAZOLAM HCL 2 MG/2ML IJ SOLN
INTRAMUSCULAR | Status: AC
  Filled 2020-08-24: qty 2

## 2020-08-24 MED ORDER — SODIUM CHLORIDE 0.9 % IV SOLN: INTRAVENOUS | Status: AC

## 2020-08-24 MED ORDER — FENTANYL CITRATE (PF) 100 MCG/2ML IJ SOLN
INTRAMUSCULAR | Status: DC | PRN
Start: 2020-08-24 — End: 2020-08-24
  Administered 2020-08-24: 50 ug via INTRAVENOUS

## 2020-08-24 MED ORDER — FENTANYL CITRATE (PF) 100 MCG/2ML IJ SOLN
INTRAMUSCULAR | Status: AC
  Filled 2020-08-24: qty 2

## 2020-08-24 MED ORDER — ACETAMINOPHEN 325 MG PO TABS
650.0000 mg | ORAL_TABLET | ORAL | Status: DC | PRN
  Administered 2020-08-24: 650 mg via ORAL
  Filled 2020-08-24: qty 2

## 2020-08-24 MED ORDER — LIDOCAINE HCL (PF) 1 % IJ SOLN
INTRAMUSCULAR | Status: AC
  Filled 2020-08-24: qty 30

## 2020-08-24 MED ORDER — NITROGLYCERIN 1 MG/10 ML FOR IR/CATH LAB
INTRA_ARTERIAL | Status: AC
  Filled 2020-08-24: qty 10

## 2020-08-24 MED ORDER — SODIUM CHLORIDE 0.9 % IV SOLN: 250.0000 mL | INTRAVENOUS | Status: DC | PRN

## 2020-08-24 MED ORDER — TICAGRELOR 90 MG PO TABS: 90.0000 mg | ORAL_TABLET | Freq: Two times a day (BID) | ORAL | 0 refills | Status: DC

## 2020-08-24 MED ORDER — ASPIRIN 81 MG PO CHEW: 81.0000 mg | CHEWABLE_TABLET | ORAL | Status: DC

## 2020-08-24 MED ORDER — LIDOCAINE HCL (PF) 1 % IJ SOLN
INTRAMUSCULAR | Status: DC | PRN
  Administered 2020-08-24: 2 mL

## 2020-08-24 MED ORDER — TICAGRELOR 90 MG PO TABS: 90.0000 mg | ORAL_TABLET | Freq: Two times a day (BID) | ORAL | Status: DC

## 2020-08-24 MED ORDER — ONDANSETRON HCL 4 MG/2ML IJ SOLN: 4.0000 mg | Freq: Four times a day (QID) | INTRAMUSCULAR | Status: DC | PRN

## 2020-08-24 MED ORDER — VERAPAMIL HCL 2.5 MG/ML IV SOLN
INTRAVENOUS | Status: AC
  Filled 2020-08-24: qty 2

## 2020-08-24 MED ORDER — SODIUM CHLORIDE 0.9% FLUSH: 3.0000 mL | INTRAVENOUS | Status: DC | PRN

## 2020-08-24 MED ORDER — VERAPAMIL HCL 2.5 MG/ML IV SOLN
INTRAVENOUS | Status: DC | PRN
  Administered 2020-08-24: 10 mL via INTRA_ARTERIAL

## 2020-08-24 MED ORDER — HEPARIN (PORCINE) IN NACL 1000-0.9 UT/500ML-% IV SOLN
INTRAVENOUS | Status: AC
  Filled 2020-08-24: qty 1000

## 2020-08-24 MED ORDER — NITROGLYCERIN 1 MG/10 ML FOR IR/CATH LAB
INTRA_ARTERIAL | Status: DC | PRN
  Administered 2020-08-24: 200 ug via INTRACORONARY

## 2020-08-24 MED ORDER — HEPARIN SODIUM (PORCINE) 1000 UNIT/ML IJ SOLN
INTRAMUSCULAR | Status: AC
  Filled 2020-08-24: qty 1

## 2020-08-24 MED ORDER — HYDRALAZINE HCL 20 MG/ML IJ SOLN: 10.0000 mg | INTRAMUSCULAR | Status: DC | PRN

## 2020-08-24 MED ORDER — MIDAZOLAM HCL 2 MG/2ML IJ SOLN
INTRAMUSCULAR | Status: DC | PRN
  Administered 2020-08-24: 1 mg via INTRAVENOUS

## 2020-08-24 MED ORDER — HEPARIN SODIUM (PORCINE) 1000 UNIT/ML IJ SOLN
INTRAMUSCULAR | Status: DC | PRN
  Administered 2020-08-24: 6000 [IU] via INTRAVENOUS
  Administered 2020-08-24: 5000 [IU] via INTRAVENOUS
  Administered 2020-08-24: 3000 [IU] via INTRAVENOUS
  Administered 2020-08-24: 2000 [IU] via INTRAVENOUS
  Administered 2020-08-24: 4000 [IU] via INTRAVENOUS

## 2020-08-24 MED ORDER — ASPIRIN 81 MG PO CHEW: 81.0000 mg | CHEWABLE_TABLET | Freq: Every day | ORAL | Status: DC

## 2020-08-24 MED ORDER — TICAGRELOR 90 MG PO TABS: 90.0000 mg | ORAL_TABLET | Freq: Two times a day (BID) | ORAL | 2 refills | Status: DC

## 2020-08-24 MED ORDER — SODIUM CHLORIDE 0.9 % WEIGHT BASED INFUSION
3.0000 mL/kg/h | INTRAVENOUS | Status: AC
  Administered 2020-08-24: 3 mL/kg/h via INTRAVENOUS

## 2020-08-24 MED ORDER — IOHEXOL 350 MG/ML SOLN
INTRAVENOUS | Status: DC | PRN
  Administered 2020-08-24: 160 mL

## 2020-08-24 MED ORDER — LABETALOL HCL 5 MG/ML IV SOLN: 10.0000 mg | INTRAVENOUS | Status: DC | PRN

## 2020-08-24 MED ORDER — TICAGRELOR 90 MG PO TABS
ORAL_TABLET | ORAL | Status: DC | PRN
  Administered 2020-08-24: 180 mg via ORAL

## 2020-08-24 MED FILL — BRILINTA 90 MG TABLET: 90 | 30 days supply | Qty: 60 | Fill #0

## 2020-08-24 SURGICAL SUPPLY — 20 items
BALLN SAPPHIRE 2.5X12 (BALLOONS) ×2
BALLN SAPPHIRE ~~LOC~~ 3.5X8 (BALLOONS) ×1 IMPLANT
BALLOON SAPPHIRE 2.5X12 (BALLOONS) IMPLANT
CATH 5FR JL3.5 JR4 ANG PIG MP (CATHETERS) ×1 IMPLANT
CATH OPTICROSS HD (CATHETERS) ×1 IMPLANT
CATH VISTA GUIDE 6FR XBLAD3.5 (CATHETERS) ×1 IMPLANT
DEVICE RAD COMP TR BAND LRG (VASCULAR PRODUCTS) ×1 IMPLANT
GLIDESHEATH SLEND SS 6F .021 (SHEATH) ×1 IMPLANT
GUIDEWIRE INQWIRE 1.5J.035X260 (WIRE) IMPLANT
INQWIRE 1.5J .035X260CM (WIRE) ×2
KIT ENCORE 26 ADVANTAGE (KITS) ×1 IMPLANT
KIT HEART LEFT (KITS) ×2 IMPLANT
PACK CARDIAC CATHETERIZATION (CUSTOM PROCEDURE TRAY) ×2 IMPLANT
SLED PULL BACK IVUS (MISCELLANEOUS) ×1 IMPLANT
STENT RESOLUTE ONYX 3.0X12 (Permanent Stent) ×1 IMPLANT
SYR MEDRAD MARK 7 150ML (SYRINGE) ×1 IMPLANT
TRANSDUCER W/STOPCOCK (MISCELLANEOUS) ×2 IMPLANT
TUBING CIL FLEX 10 FLL-RA (TUBING) ×2 IMPLANT
WIRE COUGAR XT STRL 190CM (WIRE) ×1 IMPLANT
WIRE RUNTHROUGH .014X180CM (WIRE) ×1 IMPLANT

## 2020-08-24 NOTE — Progress Notes (Signed)
Patient was given discharge instructions. He verbalized understanding. 

## 2020-08-24 NOTE — Interval H&P Note (Signed)
History and Physical Interval Note:  08/24/2020 8:50 AM  Austin Oliver  has presented today for surgery, with the diagnosis of unstable angina.  The various methods of treatment have been discussed with the patient and family. After consideration of risks, benefits and other options for treatment, the patient has consented to  Procedure(s): LEFT HEART CATH AND CORONARY ANGIOGRAPHY (N/A) as a surgical intervention.  The patient's history has been reviewed, patient examined, no change in status, stable for surgery.  I have reviewed the patient's chart and labs.  Questions were answered to the patient's satisfaction.    Cath Lab Visit (complete for each Cath Lab visit)  Clinical Evaluation Leading to the Procedure:   ACS: No.  Non-ACS:    Anginal Classification: CCS IV  Anti-ischemic medical therapy: Minimal Therapy (1 class of medications)  Non-Invasive Test Results: Low-risk stress test findings: cardiac mortality <1%/year  Prior CABG: No previous CABG  Austin Oliver

## 2020-08-24 NOTE — Progress Notes (Signed)
Discussed stent, restrictions, Brilinta, diet, exercise, NTG and CRPII. Pt voiced understanding and requests his referral be sent to [facility].  [place], ACSM 2:06 PM 08/24/2020

## 2020-08-24 NOTE — Discharge Summary (Addendum)
Discharge Summary for Same Day PCI   Patient ID: Austin Oliver MRN: [id number]; DOB: [birth year]  Admit date: 08/24/2020 Discharge date: 08/24/2020  Primary Care Provider: [name], MD  Primary Cardiologist: [name], MD  Primary Electrophysiologist:  None   Discharge Diagnoses    Principal Problem:   Unstable angina [facility])    Diagnostic Studies/Procedures    Cardiac Catheterization 08/24/2020:  Conclusions: 1. Severe single-vessel coronary artery disease with sequential 90% ostial/proximal, 40% mid, and 50% mid/distal LAD stenoses as well as 60% D3 lesion. 2. Moderate, non-obstructive coronary artery disease involving the LCx and RCA. 3. Normal left ventricular systolic function with upper normal filling pressure. 4. Successful PCI to the ostial/proximal LAD using Resolute Onyx 3.0 x 12 mm drug-eluting stent (postdilated to 3.6 mm) with 0% residual stenosis and TIMI-3 flow.  Recommendations: 1. Dual antiplatelet therapy with aspirin and ticagrelor for 12 months. 2. Aggressive secondary prevention. 3. Medical therapy of non-critical mid/distal LAD, LCx, and RCA disease. 4. Anticipate same-day discharge if no post-catheterization complications occur.  [name], MD   Diagnostic Dominance: Right  Intervention    History of Present Illness     Austin Oliver is a 64 y.o. male with with pmh of sinus bradycardia, coronary artery calcification and ascending aorta atherosclerosis seen on CT, HTN, HLD with statin intolerance on Zetia, GERD, remote tobacco use who presents for chest pain. Previously seen by Dr. Martinique for pre-op eval and was having chest pain. Nuclear stress test was low risk, LVEF 30-45%, no ischemia. Echo showed EF 60-65%, mild LVH, no WMA, G1DD. Holter monitor at that time showed SB with rare PACs and PAC couplets.   He was recently seen 07/31/20 for chest pain. Nuclear stress test obtained which was normal with no prior  infarct or ischemia, EF 57%.   Patient called the office report an episode of chest pain EMS was called who recommended ER evaluation however pain resolved. Patient called cardiology and was scheduled to be seen in the office.   In the office he reported chest pain woke him up that morning at 5:30AM. It was substernal and sharp in nature. It was 6/10. He had associated sob and diaphoresis. No nasuea or vomiting. About 30 minutes later he was still having chest pain so he took a NTGx1 which improved pain. EMS was called who reported higher BP 167/76. They recommended ER eval however patient was not wanting to wait in the ER. At office visit EKG showed SB with some minimal ST depression lateral leads. He had been taking Imdur as well without relief. Overall he felt symptoms are becoming more frequent. He has been taking SL nitro daily. Given symptoms, cardiac catheterization was arranged for further evaluation.  Hospital Course     The patient underwent cardiac cath as noted above with single vessel disease with sequential 90% ostial/proximal, 40% mid, and 50% mid/distal LAD stenoses as well as 60% D3 lesion. Successful PCI/DES to the ostial/pLAD. Plan for DAPT with ASA/Brilinta for at least one year. The patient was seen by cardiac rehab while in short stay. There were no observed complications post cath. Radial cath site was re-evaluated prior to discharge and found to be stable without any complications. Instructions/precautions regarding cath site care were given prior to discharge. Of note patient reports he has tried several statins and unable to tolerated 2/2 myalgias. Will plan referral to the lipid clinic at discharge.   Austin Oliver was seen by Dr. Saunders Revel and determined  stable for discharge home. Follow up with our office has been arranged. Medications are listed below. Pertinent changes include addition of plavix.  _____________  Cath/PCI Registry Performance & Quality Measures: 1. Aspirin  prescribed? - Yes 2. ADP Receptor Inhibitor (Plavix/Clopidogrel, Brilinta/Ticagrelor or Effient/Prasugrel) prescribed (includes medically managed patients)? - Yes 3. High Intensity Statin (Lipitor 40-80mg  or Crestor 20-40mg ) prescribed? - No - statin intolerant, referral to lipid clinic 4. For EF <40%, was ACEI/ARB prescribed? - Not Applicable (EF >/= 33%) 5. For EF <40%, Aldosterone Antagonist (Spironolactone or Eplerenone) prescribed? - Not Applicable (EF >/= 00%) 6. Cardiac Rehab Phase II ordered (Included Medically managed Patients)? - Yes  _____________   Discharge Vitals Blood pressure (!) 150/65, pulse (!) 44, temperature 97.9 F (36.6 C), temperature source Oral, resp. rate 18, height 5\' 9"  (1.753 m), weight 106.6 kg, SpO2 100 %.  Filed Weights   08/24/20 0822  Weight: 106.6 kg    Last Labs & Radiologic Studies    CBC Recent Labs    08/22/20 1044  WBC 6.0  HGB 12.0*  HCT 35.7*  MCV 91  PLT 762*   Basic Metabolic Panel Recent Labs    08/22/20 1044  NA 141  K 4.2  CL 105  CO2 23  GLUCOSE 156*  BUN 22  CREATININE 1.18  CALCIUM 9.2   Liver Function Tests No results for input(s): AST, ALT, ALKPHOS, BILITOT, PROT, ALBUMIN in the last 72 hours. No results for input(s): LIPASE, AMYLASE in the last 72 hours. High Sensitivity Troponin:   No results for input(s): TROPONINIHS in the last 720 hours.  BNP Invalid input(s): POCBNP D-Dimer No results for input(s): DDIMER in the last 72 hours. Hemoglobin A1C No results for input(s): HGBA1C in the last 72 hours. Fasting Lipid Panel Recent Labs    08/22/20 1044  CHOL 158  HDL 35*  LDLCALC 106*  TRIG 88  CHOLHDL 4.5   Thyroid Function Tests No results for input(s): TSH, T4TOTAL, T3FREE, THYROIDAB in the last 72 hours.  Invalid input(s): FREET3 _____________  CARDIAC CATHETERIZATION  Result Date: 08/24/2020 Conclusions: 1. Severe single-vessel coronary artery disease with sequential 90% ostial/proximal, 40%  mid, and 50% mid/distal LAD stenoses as well as 60% D3 lesion. 2. Moderate, non-obstructive coronary artery disease involving the LCx and RCA. 3. Normal left ventricular systolic function with upper normal filling pressure. 4. Successful PCI to the ostial/proximal LAD using Resolute Onyx 3.0 x 12 mm drug-eluting stent (postdilated to 3.6 mm) with 0% residual stenosis and TIMI-3 flow. Recommendations: 1. Dual antiplatelet therapy with aspirin and ticagrelor for 12 months. 2. Aggressive secondary prevention. 3. Medical therapy of non-critical mid/distal LAD, LCx, and RCA disease. 4. Anticipate same-day discharge if no post-catheterization complications occur. [name], MD [facility]   MYOCARDIAL PERFUSION IMAGING  Result Date: 08/06/2020  Nuclear stress EF: 57%.  There was no ST segment deviation noted during stress.  The study is normal.  This is a low risk study.  The left ventricular ejection fraction is normal (55-65%).  Normal pharmacologic nuclear stress test with no evidence for prior infarct or ischemia. LVEF 57%. Left ventricle appears mildly dilated.   Disposition   Pt is being discharged home today in good condition.  Follow-up Plans & Appointments     Follow-up Information    [name], MD Follow up on 09/11/2020.   Specialty: Cardiology Why: at 8am for your follow up appt.  Contact information: [address] [place] [address] [phone]  Discharge Instructions    AMB Referral to Advanced Lipid Disorders Clinic   Complete by: As directed    Reason for referral: Patients with statin intolerance (failed 2 statins, one of which must be a high potency statin)   Internal Lipid Clinic Referral Scheduling  Internal lipid clinic referrals are providers within [facility], who wish to refer established patients for routine management (help in starting PCSK9 inhibitor therapy) or advanced therapies.  Internal MD referral  criteria:              1. All patients with LDL>190 mg/dL  2. All patients with Triglycerides >500 mg/dL  3. Patients with suspected or confirmed heterozygous familial hyperlipidemia (HeFH) or homozygous familial hyperlipidemia (HoFH)  4. Patients with family history of suspicious for genetic dyslipidemia desiring genetic testing  5. Patients refractory to standard guideline based therapy  6. Patients with statin intolerance (failed 2 statins, one of which must be a high potency statin)  7. Patients who the provider desires to be seen by MD   Internal PharmD referral criteria:   1. Follow-up patients for medication management  2. Follow-up for compliance monitoring  3. Patients for drug education  4. Patients with statin intolerance  5. PCSK9 inhibitor education and prior authorization approvals  6. Patients with triglycerides <500 mg/dL  External Lipid Clinic Referral  External lipid clinic referrals are for providers outside of [facility], considered new clinic patients - automatically routed to MD schedule   Amb Referral to Cardiac Rehabilitation   Complete by: As directed    To [place]   Diagnosis:  PTCA Coronary Stents     After initial evaluation and assessments completed: Virtual Based Care may be provided alone or in conjunction with Phase 2 Cardiac Rehab based on patient barriers.: Yes       Discharge Medications   Allergies as of 08/24/2020      Reactions   Bee Venom Swelling   Extreme Swelling of site     Statins Other (See Comments)   Muscles aches with several statins   Cleocin [clindamycin Hcl] Rash   RASH IN BETWEEN FINGERS      Medication List    TAKE these medications   acetaminophen 500 MG tablet Commonly known as: TYLENOL Take 500 mg by mouth every 6 (six) hours as needed (for pain.).   [place]away 0.025 % ophthalmic solution Generic drug: ketotifen Place 1 drop into both eyes 2 (two) times daily as needed (for allergy eyes).   aspirin 81 MG  chewable tablet Chew 81 mg by mouth at bedtime.   Co Q-10 100 MG Caps Take 100 mg by mouth daily with supper. Ultra Co q10   ezetimibe 10 MG tablet Commonly known as: ZETIA Take 10 mg by mouth daily.   FISH OIL PO Take 1,300 mg by mouth 2 (two) times daily.   gabapentin 100 MG capsule Commonly known as: NEURONTIN Take 200 mg by mouth at bedtime.   ibuprofen 200 MG tablet Commonly known as: ADVIL Take 400 mg by mouth every 8 (eight) hours as needed (inflammation/pain.).   isosorbide mononitrate 60 MG 24 hr tablet Commonly known as: IMDUR Take 1.5 tablets (90 mg total) by mouth daily. What changed:   how much to take  additional instructions   levothyroxine 112 MCG tablet Commonly known as: SYNTHROID Take 112 mcg by mouth daily before breakfast.   loteprednol 0.5 % ophthalmic suspension Commonly known as: LOTEMAX Place 1 drop into the left eye every other day.  nitroGLYCERIN 0.4 MG SL tablet Commonly known as: NITROSTAT Place 0.4 mg under the tongue every 5 (five) minutes x 3 doses as needed for chest pain.   pantoprazole 40 MG tablet Commonly known as: PROTONIX Take 40 mg by mouth daily after supper.   terazosin 5 MG capsule Commonly known as: HYTRIN Take 5 mg by mouth at bedtime.   ticagrelor 90 MG Tabs tablet Commonly known as: BRILINTA Take 1 tablet (90 mg total) by mouth 2 (two) times daily.       Allergies Allergies  Allergen Reactions  . Bee Venom Swelling    Extreme Swelling of site    . Statins Other (See Comments)    Muscles aches with several statins  . Cleocin [Clindamycin Hcl] Rash    RASH IN BETWEEN FINGERS    Outstanding Labs/Studies   Referral placed to lipid clinic  Duration of Discharge Encounter   Greater than 30 minutes including physician time.  Signed, Reino Bellis, NP 08/24/2020, 2:28 PM  I have independently seen and examined the patient and agree with the findings and plan, as documented in Ms. Mancel Bale' note,  with the following additions/changes.  Austin Oliver reported very mild chest pain that began during PCI and persisted in the recovery area.  He describes it is a faint ache.  Repeat EKG showed sinus bradycardia but no other significant abnormalities, stable from prior tracings.  Pain did not improve with sublingual NTG but resolved promptly with acetaminophen. He is stable for discharge home today with ASA and ticagrelor.  Agree with referral to lipid clinic to consider PCSK9 inhibitor therapy.  [name], MD [facility]

## 2020-08-24 NOTE — Discharge Instructions (Signed)
Drink plenty of fluid for 48 hours and keep wrist elevated at heart level for 24 hours  Radial Site Care   This sheet gives you information about how to care for yourself after your procedure. Your health care provider may also give you more specific instructions. If you have problems or questions, contact your health care provider. What can I expect after the procedure? After the procedure, it is common to have:  Bruising and tenderness at the catheter insertion area. Follow these instructions at home: Medicines  Take over-the-counter and prescription medicines only as told by your health care provider. Insertion site care 1. Follow instructions from your health care provider about how to take care of your insertion site. Make sure you: ? Wash your hands with soap and water before you change your bandage (dressing). If soap and water are not available, use hand sanitizer. ? remove your dressing as told by your health care provider. In 24 hours 2. Check your insertion site every day for signs of infection. Check for: ? Redness, swelling, or pain. ? Fluid or blood. ? Pus or a bad smell. ? Warmth. 3. Do not take baths, swim, or use a hot tub until your health care provider approves. 4. You may shower 24-48 hours after the procedure, or as directed by your health care provider. ? Remove the dressing and gently wash the site with plain soap and water. ? Pat the area dry with a clean towel. ? Do not rub the site. That could cause bleeding. 5. Do not apply powder or lotion to the site. Activity   1. For 24 hours after the procedure, or as directed by your health care provider: ? Do not flex or bend the affected arm. ? Do not push or pull heavy objects with the affected arm. ? Do not drive yourself home from the hospital or clinic. You may drive 24 hours after the procedure unless your health care provider tells you not to. ? Do not operate machinery or power tools. 2. Do not lift  anything that is heavier than 10 lb (4.5 kg), or the limit that you are told, until your health care provider says that it is safe. For 4 days 3. Ask your health care provider when it is okay to: ? Return to work or school. ? Resume usual physical activities or sports. ? Resume sexual activity. General instructions  If the catheter site starts to bleed, raise your arm and put firm pressure on the site. If the bleeding does not stop, get help right away. This is a medical emergency.  If you went home on the same day as your procedure, a responsible adult should be with you for the first 24 hours after you arrive home.  Keep all follow-up visits as told by your health care provider. This is important. Contact a health care provider if:  You have a fever.  You have redness, swelling, or yellow drainage around your insertion site. Get help right away if:  You have unusual pain at the radial site.  The catheter insertion area swells very fast.  The insertion area is bleeding, and the bleeding does not stop when you hold steady pressure on the area.  Your arm or hand becomes pale, cool, tingly, or numb. These symptoms may represent a serious problem that is an emergency. Do not wait to see if the symptoms will go away. Get medical help right away. Call your local emergency services (911 in the [place] Do not   drive yourself to the hospital. Summary  After the procedure, it is common to have bruising and tenderness at the site.  Follow instructions from your health care provider about how to take care of your radial site wound. Check the wound every day for signs of infection.  Do not lift anything that is heavier than 10 lb (4.5 kg), or the limit that you are told, until your health care provider says that it is safe. This information is not intended to replace advice given to you by your health care provider. Make sure you discuss any questions you have with your health care  provider. Document Revised: 12/09/2017 Document Reviewed: 12/09/2017 Elsevier Patient Education  2020 [employer]  

## 2020-08-27 ENCOUNTER — Other Ambulatory Visit: Payer: Self-pay

## 2020-08-27 ENCOUNTER — Encounter (HOSPITAL_COMMUNITY): Payer: Self-pay | Admitting: Internal Medicine

## 2020-08-27 ENCOUNTER — Ambulatory Visit
Admission: RE | Admit: 2020-08-27 | Discharge: 2020-08-27 | Disposition: A | Discharge status: Home / Self Care | Payer: Medicare Other | Source: Ambulatory Visit | Attending: Family Medicine | Admitting: Family Medicine

## 2020-08-27 ENCOUNTER — Telehealth: Payer: Self-pay | Admitting: Internal Medicine

## 2020-08-27 DIAGNOSIS — Z87891 Personal history of nicotine dependence: Secondary | ICD-10-CM

## 2020-08-27 MED ORDER — NITROGLYCERIN 0.4 MG SL SUBL: 0.4000 mg | SUBLINGUAL_TABLET | SUBLINGUAL | 4 refills | Status: AC | PRN

## 2020-08-27 NOTE — Telephone Encounter (Signed)
    Pt c/o of Chest Pain: STAT if CP now or developed within 24 hours  1. Are you having CP right now? No  2. Are you experiencing any other symptoms (ex. SOB, nausea, vomiting, sweating)?   3. How long have you been experiencing CP? 08/25/2020  4. Is your CP continuous or coming and going? Coming and going  5. Have you taken Nitroglycerin? Yes  Pt said since his procedure he gets slight chest pain every since, she take tylenol and nitroglycerin for the pain, he said it does help but it goes back. He wanted to know what he needs to do ?

## 2020-08-27 NOTE — Telephone Encounter (Signed)
64 yo M with sinus bradycardia and recent unstable angina s/p PCI who still has residual pain.  Not improved my nitroglycerin.  Given recent PCI, would work to uptitrate antianginal medications.  Concern he may not tolerate BB as above.  Presently takes 60 mg Imdur daily, and 30 mg nighty.  Would try 120 mg daily without BID dosing; and check blood pressure at home.    If worsening can bring back for further mgmt.  [name], MD

## 2020-08-27 NOTE — Telephone Encounter (Signed)
Spoke with Austin Oliver and continues to note slight chest pain on the scale of 1-10 is around 1 1/2 -2 no other symptoms Per Austin Oliver Tylenol helps but pain returns and taking ntg sees no difference in pain Per Austin Oliver walked to mailbox Saturday at a good pace and did not note SOB as did in the past Will forward to Dr Becky Augusta for review and recommendations ./cy

## 2020-08-27 NOTE — Addendum Note (Signed)
Addended by: Devra Dopp E on: 08/27/2020 02:30 PM   Modules accepted: Orders

## 2020-08-27 NOTE — Telephone Encounter (Signed)
Pt aware and verbalizes understanding./cy ?

## 2020-09-03 ENCOUNTER — Telehealth (HOSPITAL_COMMUNITY): Payer: Self-pay

## 2020-09-03 NOTE — Telephone Encounter (Signed)
Faxed cardiac rehab to [facility] cardiac [facility].

## 2020-09-11 ENCOUNTER — Other Ambulatory Visit: Payer: Self-pay

## 2020-09-11 ENCOUNTER — Ambulatory Visit: Payer: Medicare Other | Admitting: Internal Medicine

## 2020-09-11 ENCOUNTER — Encounter: Payer: Self-pay | Admitting: Internal Medicine

## 2020-09-11 VITALS — BP 142/70 | HR 50 | Ht 69.0 in | Wt 239.0 lb

## 2020-09-11 DIAGNOSIS — I2510 Atherosclerotic heart disease of native coronary artery without angina pectoris: Secondary | ICD-10-CM | POA: Insufficient documentation

## 2020-09-11 DIAGNOSIS — E7849 Other hyperlipidemia: Secondary | ICD-10-CM

## 2020-09-11 DIAGNOSIS — I700 Atherosclerosis of aorta: Secondary | ICD-10-CM

## 2020-09-11 DIAGNOSIS — R001 Bradycardia, unspecified: Secondary | ICD-10-CM

## 2020-09-11 DIAGNOSIS — E785 Hyperlipidemia, unspecified: Secondary | ICD-10-CM | POA: Insufficient documentation

## 2020-09-11 DIAGNOSIS — R0602 Shortness of breath: Secondary | ICD-10-CM

## 2020-09-11 DIAGNOSIS — Z79899 Other long term (current) drug therapy: Secondary | ICD-10-CM

## 2020-09-11 DIAGNOSIS — R0989 Other specified symptoms and signs involving the circulatory and respiratory systems: Secondary | ICD-10-CM

## 2020-09-11 DIAGNOSIS — I10 Essential (primary) hypertension: Secondary | ICD-10-CM | POA: Diagnosis not present

## 2020-09-11 DIAGNOSIS — Z789 Other specified health status: Secondary | ICD-10-CM | POA: Insufficient documentation

## 2020-09-11 DIAGNOSIS — R600 Localized edema: Secondary | ICD-10-CM | POA: Insufficient documentation

## 2020-09-11 HISTORY — DX: Essential (primary) hypertension: I10

## 2020-09-11 MED ORDER — ISOSORBIDE MONONITRATE ER 120 MG PO TB24: 120.0000 mg | ORAL_TABLET | Freq: Every day | ORAL | 3 refills | Status: DC

## 2020-09-11 MED ORDER — RANOLAZINE ER 500 MG PO TB12: 500.0000 mg | ORAL_TABLET | Freq: Two times a day (BID) | ORAL | 3 refills | Status: DC

## 2020-09-11 NOTE — Progress Notes (Signed)
Cardiology Office Note:    Date:  09/11/2020   ID:  Austin Oliver, DOB 09/24/1956, MRN [id number]  PCP:  [name], MD  [facility] Cardiologist:  [name], MD   CC: Follow up after PCI  History of Present Illness:    Austin Oliver is a 64 y.o. male with a hx of Sinus Bradycardia, Aortic Atherosclerosis & HLD with statin intolerance, HTN who presented 08/01/19 with unstable angina.  Shared decision making for NM Stress showed no lesion but without resolution of pain, received LCP with ostial LAD lesion s/p PCI 08/24/20.  Residual pain 08/27/20 with increase in his Imdur.  Patient notes that he still has some chest pain, though it is much improved.  He still need takes occasional nitroglycerin.  Able to work in the garden without incident.  At night has side chest pain. Starting cardiac rehabilitation.  Notes some wheezing.  No worsening shortness of breath since starting ticagrelor.  Notes LE edema.    Ambulatory blood pressure at home has been 129/66 on average.  Heart rates as high as 81 but average great rate 54 bpm.  Past Medical History:  Diagnosis Date  . Bradycardia   . Coronary artery calcification seen on CAT scan 01/14/2017  . Essential hypertension 09/11/2020  . Family history of thyroid problem   . GERD (gastroesophageal reflux disease)   . History of kidney stones   . Hyperlipidemia   . Hypothyroidism   . Osteoarthritis   . Shingles 2012  . SOB (shortness of breath) on exertion   . Thyroid disease     Past Surgical History:  Procedure Laterality Date  . CORONARY STENT INTERVENTION N/A 08/24/2020   Procedure: CORONARY STENT INTERVENTION;  Surgeon: [name], MD;  Location: [place] [facility];  Service: Cardiovascular;  Laterality: N/A;  . HEEL SPUR SURGERY Left 80's  . INTRAVASCULAR ULTRASOUND/IVUS N/A 08/24/2020   Procedure: Intravascular Ultrasound/IVUS;  Surgeon: [name], MD;  Location: [place] [facility];  Service:  Cardiovascular;  Laterality: N/A;  . KNEE SURGERY Bilateral    numerous times  . LEFT HEART CATH AND CORONARY ANGIOGRAPHY N/A 08/24/2020   Procedure: LEFT HEART CATH AND CORONARY ANGIOGRAPHY;  Surgeon: [name], MD;  Location: [place] [facility];  Service: Cardiovascular;  Laterality: N/A;  . NECK SURGERY  70's  . TOTAL HIP ARTHROPLASTY Left 03/09/2017   Procedure: LEFT TOTAL HIP ARTHROPLASTY ANTERIOR APPROACH;  Surgeon: Rod Can, MD;  Location: [facility];  Service: Orthopedics;  Laterality: Left;  Dr. requesting RNFA    Current Medications: Current Meds  Medication Sig  . acetaminophen (TYLENOL) 500 MG tablet Take 500 mg by mouth every 6 (six) hours as needed (for pain.).  Marland Kitchen aspirin 81 MG chewable tablet Chew 81 mg by mouth at bedtime.   . Coenzyme Q10 (CO Q-10) 100 MG CAPS Take 100 mg by mouth daily with supper. Ultra Co q10  . ezetimibe (ZETIA) 10 MG tablet Take 10 mg by mouth daily.   Marland Kitchen gabapentin (NEURONTIN) 100 MG capsule Take 100-300 mg by mouth at bedtime.   Marland Kitchen ibuprofen (ADVIL) 200 MG tablet Take 400 mg by mouth every 8 (eight) hours as needed (inflammation/pain.).  Marland Kitchen isosorbide mononitrate (IMDUR) 60 MG 24 hr tablet Take 120 mg by mouth daily.  Marland Kitchen ketotifen (ALAWAY) 0.025 % ophthalmic solution Place 1 drop into both eyes 2 (two) times daily as needed (for allergy eyes).  Marland Kitchen levothyroxine (SYNTHROID) 112 MCG tablet Take 112 mcg by  mouth daily before breakfast.  . loteprednol (LOTEMAX) 0.5 % ophthalmic suspension Place 1 drop into the left eye every other day.   . nitroGLYCERIN (NITROSTAT) 0.4 MG SL tablet Place 1 tablet (0.4 mg total) under the tongue every 5 (five) minutes x 3 doses as needed for chest pain.  . Omega-3 Fatty Acids (FISH OIL PO) Take 1,300 mg by mouth 2 (two) times daily.  . pantoprazole (PROTONIX) 40 MG tablet Take 40 mg by mouth daily after supper.   . terazosin (HYTRIN) 5 MG capsule Take 5 mg by mouth at bedtime.  . ticagrelor (BRILINTA) 90 MG TABS tablet  Take 1 tablet (90 mg total) by mouth 2 (two) times daily.     Allergies:   Bee venom, Statins, and Cleocin [clindamycin hcl]   Social History   Socioeconomic History  . Marital status: Married    Spouse name: [name]  . Number of children: 2  . Years of education: Not on file  . Highest education level: Not on file  Occupational History  . Occupation: Animator  Tobacco Use  . Smoking status: Former Smoker    Quit date: 12/05/2016    Years since quitting: 3.7  . Smokeless tobacco: Never Used  Substance and Sexual Activity  . Alcohol use: No  . Drug use: No  . Sexual activity: Not on file  Other Topics Concern  . Not on file  Social History Narrative  . Not on file   Social Determinants of Health   Financial Resource Strain:   . Difficulty of Paying Living Expenses: Not on file  Food Insecurity:   . Worried About Charity fundraiser in the Last Year: Not on file  . Ran Out of Food in the Last Year: Not on file  Transportation Needs:   . Lack of Transportation (Medical): Not on file  . Lack of Transportation (Non-Medical): Not on file  Physical Activity:   . Days of Exercise per Week: Not on file  . Minutes of Exercise per Session: Not on file  Stress:   . Feeling of Stress : Not on file  Social Connections:   . Frequency of Communication with Friends and Family: Not on file  . Frequency of Social Gatherings with Friends and Family: Not on file  . Attends Religious Services: Not on file  . Active Member of Clubs or Organizations: Not on file  . Attends Archivist Meetings: Not on file  . Marital Status: Not on file     Family History: The patient's family history includes COPD in his father; Dementia in his mother; Heart Problems in his father; Lung cancer in his father.  ROS:   Please see the history of present illness.     All other systems reviewed and are negative.  EKGs/Labs/Other Studies Reviewed:    The following studies were  reviewed today:  EKG:   EKG 08/24/20 marked sinus bradycardia rate 40, no ST/T changes 07/31/20 sinus bradycardia rate of 51, with possible anterior q waves OSH EKG 07/16/20:  Sinus bradycardia rate of 48 without ST/T changes or q waves.  Recent Labs: 03/08/2020: ALT 53 08/22/2020: BUN 22; Creatinine, Ser 1.18; Hemoglobin 12.0; Platelets 136; Potassium 4.2; Sodium 141  Recent Lipid Panel    Component Value Date/Time   CHOL 158 08/22/2020 1044   TRIG 88 08/22/2020 1044   HDL 35 (L) 08/22/2020 1044   CHOLHDL 4.5 08/22/2020 1044   LDLCALC 106 (H) 08/22/2020 1044   NM Stress 08/06/20  Nuclear stress EF: 57%.  There was no ST segment deviation noted during stress.  The study is normal.  This is a low risk study.  The left ventricular ejection fraction is normal (55-65%).   Normal pharmacologic nuclear stress test with no evidence for prior infarct or ischemia. LVEF 57%. Left ventricle appears mildly dilated.  LCP Report 08/24/20 Conclusions: 1. Severe single-vessel coronary artery disease with sequential 90% ostial/proximal, 40% mid, and 50% mid/distal LAD stenoses as well as 60% D3 lesion. 2. Moderate, non-obstructive coronary artery disease involving the LCx and RCA. 3. Normal left ventricular systolic function with upper normal filling pressure. 4. Successful PCI to the ostial/proximal LAD using Resolute Onyx 3.0 x 12 mm drug-eluting stent (postdilated to 3.6 mm) with 0% residual stenosis and TIMI-3 flow.  Recommendations: 1. Dual antiplatelet therapy with aspirin and ticagrelor for 12 months. 2. Aggressive secondary prevention. 3. Medical therapy of non-critical mid/distal LAD, LCx, and RCA disease. 4. Anticipate same-day discharge if no post-catheterization complications occur.  Physical Exam:    VS:  BP (!) 142/70   Pulse (!) 50   Ht 5\' 9"  (1.753 m)   Wt 239 lb (108.4 kg)   BMI 35.29 kg/m     Wt Readings from Last 3 Encounters:  09/11/20 239 lb (108.4 kg)   08/24/20 235 lb (106.6 kg)  08/21/20 235 lb 6.4 oz (106.8 kg)     GEN: Well nourished, well developed in no acute distress HEENT: Normal NECK: No JVD; Possible R sided carotid bruits LYMPHATICS: No lymphadenopathy CARDIAC: RRR, no murmurs, rubs, gallops RESPIRATORY:  Clear to auscultation without rales, wheezing or rhonchi  ABDOMEN: Soft, non-tender, non-distended MUSCULOSKELETAL:  No edema; No deformity  SKIN: Warm and dry NEUROLOGIC:  Alert and oriented x 3 PSYCHIATRIC:  Normal affect   ASSESSMENT:    1. Coronary artery disease involving native coronary artery of native heart without angina pectoris   2. Essential hypertension   3. Other hyperlipidemia    PLAN:    In order of problems listed above:  Coronary Artery Disease; Obstructive s/p PCI 08/24/20 With HTN, HLD, Aortic atherosclerosis and sinus bradycardia With statin intolerance With cirrhosis of the liver Lower extremity edema Morbid Obesity - symptomatic with  - anatomy: PCi LAD, 40% mLAD, 50% dLAD, 60% D3, < 50% RCA and LCx - continue ASA 81 mg; Continue ticagrelor until 08/24/21  - continue zetia, goal LDL < 70; would offer Repatha and can go to lipid clinic - Likely would not tolerate BB with present heart rates - continue nitrate 120 Imdur - Ranexa 500 mg BID  - Going to cardiac rehab today - - will get baseline echocardiogram post PCI - Shared decision making:  Will get carotid artery duplex  - will get BMP and BNP today  3 month follow up unless new symptoms or abnormal test results warranting change in plan  Would be reasonable for Virtual Follow up Would be reasonable for  APP Follow up   Medication Adjustments/Labs and Tests Ordered: Current medicines are reviewed at length with the patient today.  Concerns regarding medicines are outlined above.  No orders of the defined types were placed in this encounter.  No orders of the defined types were placed in this encounter.   There are no  Patient Instructions on file for this visit.   Signed, [name], MD  09/11/2020 8:28 AM    [place]

## 2020-09-11 NOTE — Patient Instructions (Signed)
Medication Instructions:  Please increase your Isosorbide to 120 mg a day. Please start Ranexa 500 mg one tablet twice a day. Continue all other medications as listed.  *If you need a refill on your cardiac medications before your next appointment, please call your pharmacy*  Lab Work: Please have blood work today (BMP, Pro-BNP) If you have labs (blood work) drawn today and your tests are completely normal, you will receive your results only by: Marland Kitchen MyChart Message (if you have MyChart) OR . A paper copy in the mail If you have any lab test that is abnormal or we need to change your treatment, we will call you to review the results.   Testing/Procedures: Your physician has requested that you have an echocardiogram. Echocardiography is a painless test that uses sound waves to create images of your heart. It provides your doctor with information about the size and shape of your heart and how well your heart's chambers and valves are working. This procedure takes approximately one hour. There are no restrictions for this procedure.  Your physician has requested that you have a carotid duplex. This test is an ultrasound of the carotid arteries in your neck. It looks at blood flow through these arteries that supply the brain with blood. Allow one hour for this exam. There are no restrictions or special instructions.  You have been referred to the [facility] here at the [facility] location.  Follow-Up: At [facility], you and your health needs are our priority.  As part of our continuing mission to provide you with exceptional heart care, we have created designated Provider Care Teams.  These Care Teams include your primary Cardiologist (physician) and Advanced Practice Providers (APPs -  Physician Assistants and Nurse Practitioners) who all work together to provide you with the care you need, when you need it.  We recommend signing up for the patient portal called "MyChart".  Sign  up information is provided on this After Visit Summary.  MyChart is used to connect with patients for Virtual Visits (Telemedicine).  Patients are able to view lab/test results, encounter notes, upcoming appointments, etc.  Non-urgent messages can be sent to your provider as well.   To learn more about what you can do with MyChart, go to [email or web address].    Your next appointment:   3 month(s)  The format for your next appointment:   In Person  Provider:   [name], MD   Thank you for choosing [facility]!!

## 2020-09-12 LAB — PRO B NATRIURETIC PEPTIDE: NT-Pro BNP: 115 pg/mL (ref 0–376)

## 2020-09-12 LAB — BASIC METABOLIC PANEL
BUN/Creatinine Ratio: 14 (ref 10–24)
BUN: 15 mg/dL (ref 8–27)
CO2: 24 mmol/L (ref 20–29)
Calcium: 9 mg/dL (ref 8.6–10.2)
Chloride: 108 mmol/L — ABNORMAL HIGH (ref 96–106)
Creatinine, Ser: 1.1 mg/dL (ref 0.76–1.27)
GFR calc Af Amer: 81 mL/min/{1.73_m2} (ref >59)
GFR calc non Af Amer: 70 mL/min/{1.73_m2} (ref >59)
Glucose: 117 mg/dL — ABNORMAL HIGH (ref 65–99)
Potassium: 4.1 mmol/L (ref 3.5–5.2)
Sodium: 143 mmol/L (ref 134–144)

## 2020-09-18 ENCOUNTER — Ambulatory Visit (INDEPENDENT_AMBULATORY_CARE_PROVIDER_SITE_OTHER): Payer: Medicare Other | Admitting: Pharmacist

## 2020-09-18 ENCOUNTER — Other Ambulatory Visit: Payer: Self-pay

## 2020-09-18 VITALS — BP 122/72 | HR 63

## 2020-09-18 DIAGNOSIS — I2510 Atherosclerotic heart disease of native coronary artery without angina pectoris: Secondary | ICD-10-CM | POA: Diagnosis not present

## 2020-09-18 DIAGNOSIS — G720 Drug-induced myopathy: Secondary | ICD-10-CM | POA: Diagnosis not present

## 2020-09-18 DIAGNOSIS — E782 Mixed hyperlipidemia: Secondary | ICD-10-CM | POA: Diagnosis not present

## 2020-09-18 DIAGNOSIS — I208 Other forms of angina pectoris: Secondary | ICD-10-CM | POA: Diagnosis not present

## 2020-09-18 DIAGNOSIS — R079 Chest pain, unspecified: Secondary | ICD-10-CM | POA: Insufficient documentation

## 2020-09-18 DIAGNOSIS — T466X5A Adverse effect of antihyperlipidemic and antiarteriosclerotic drugs, initial encounter: Secondary | ICD-10-CM

## 2020-09-18 MED ORDER — RANOLAZINE ER 1000 MG PO TB12
1000.0000 mg | ORAL_TABLET | Freq: Two times a day (BID) | ORAL | 3 refills | Status: DC
Start: 2020-09-18 — End: 2021-10-09

## 2020-09-18 MED ORDER — RANOLAZINE ER 1000 MG PO TB12
1000.0000 mg | ORAL_TABLET | Freq: Two times a day (BID) | ORAL | 3 refills | Status: DC
Start: 2020-09-18 — End: 2020-09-18

## 2020-09-18 MED ORDER — TICAGRELOR 90 MG PO TABS
90.0000 mg | ORAL_TABLET | Freq: Two times a day (BID) | ORAL | 1 refills | Status: DC
Start: 2020-09-18 — End: 2020-10-16

## 2020-09-18 NOTE — Patient Instructions (Addendum)
It was nice to meet you today!  Your LDL is 106 and your goal is < 70  Ask your GI doctor if they have a preference with you taking any of the following cholesterol medication due to your Hepatitis A:  -Rosuvastatin 5mg  daily -Repatha or Praluent injections -Welchol  You can stop taking your over the counter fish oil  I sent in a refill of your Brilinta to [facility] mail order pharmacy for better pricing  I sent in a refill for ranolazine to [facility] for better pricing. Double up on your current dose and take 2 of the 500mg  tablets twice a day. Call [facility] when you need a refill of the higher 1000mg  dose (you'll go back to taking 1 tablet twice a day when you get this prescription). Their # is [phone]  As your primary care doctor to change your levothyroxine to [facility] for better pricing

## 2020-09-18 NOTE — Progress Notes (Signed)
Patient ID: Austin Oliver                 DOB: 1956-07-21                    MRN: [id number]     HPI: Austin Oliver is a 64 y.o. male patient referred to lipid clinic by Dr Austin Oliver. PMH is significant for aortic atherosclerosis, HLD and statin intolerance, HTN, sinus bradycardia, unstable angina 08/01/19, cirrhosis of the liver, and obesity. Pt underwent LHC on 08/24/20 which revealed severe single vessel CAD with 90% ostial/prox stenosis, 40% mid stenosis, 50% mid/distal LAD stenosis as well as 60% D3 lesion. Moderate, nonobstructive CAD was noted in the LCx and RCA. Pt underwent PCI with DES to ostial/prox LAD.  Pt presents with his wife to clinic today. He experienced chest pain earlier today after bending over, also had an episode last night. He took 2 NTG today which resolved most of his pain, however still noted some lingering chest pain. Has overall had a few episodes of chest pain in the past week. Confirms adherence to higher dose of Imdur and Ranexa that were started at his visit on 10/26. He has been keeping a closer eye on his BP and reports fluctuating SBP 120-150. BP normal in clinic today although this is after pt took 2 NTG earlier today.  Also noted some blood in his stool this past Sunday-Tuesday. Called his PCP office who recommended he take his aspirin mid-day instead of with his Brilinta dosing in the AM or PM. He did not report any blood in his stool today. Has GI appt this Thursday at 3:45pm to discuss. Still taking Protonix per GI due to a reported spot on his esophagus. Also mentions that he was recently diagnosed with Hepatitis A by his PCP this past month.  He also brings in a list of his medications and associated 3 month copays at various pharmacies. He saves $50 in filling his Brilinta at mail order compared to local pharmacy. Ranexa is $35 cheaper at [facility] than current pharmacy or mail order. Levothyroxine is $22 cheaper at [facility] as well.  Current Medications:  ezetimibe 10mg  daily, fish oil 1,300mg  BID Intolerances: pravastatin 20mg  daily, lovastatin 20mg  every other day - muscle aches even with CoQ10 Risk Factors: ASCVD LDL goal: 70mg /dL  Diet: [place] and chicken sandwiches on whole wheat bread. Limiting red meat. Switched to low fat ice cream.  Exercise: Started cardiac rehab  Family History: COPD in his father; Dementia in his mother; Heart Problems in his father; Lung cancer in his father.  Social History: Former smoker, quit in 2018, denies alcohol and drug use.  Labs: 08/22/20: TC 158, TG 88, HDL 35, LDL 106 (ezetimibe 10mg  daily)  Past Medical History:  Diagnosis Date  . Bradycardia   . Coronary artery calcification seen on CAT scan 01/14/2017  . Essential hypertension 09/11/2020  . Family history of thyroid problem   . GERD (gastroesophageal reflux disease)   . History of kidney stones   . Hyperlipidemia   . Hypothyroidism   . Osteoarthritis   . Shingles 2012  . SOB (shortness of breath) on exertion   . Thyroid disease     Current Outpatient Medications on File Prior to Visit  Medication Sig Dispense Refill  . acetaminophen (TYLENOL) 500 MG tablet Take 500 mg by mouth every 6 (six) hours as needed (for pain.).    Marland Kitchen aspirin 81 MG chewable tablet Chew 81 mg by mouth  at bedtime.     . Coenzyme Q10 (CO Q-10) 100 MG CAPS Take 100 mg by mouth daily with supper. Ultra Co q10    . ezetimibe (ZETIA) 10 MG tablet Take 10 mg by mouth daily.     Marland Kitchen gabapentin (NEURONTIN) 100 MG capsule Take 100-300 mg by mouth at bedtime.     Marland Kitchen ibuprofen (ADVIL) 200 MG tablet Take 400 mg by mouth every 8 (eight) hours as needed (inflammation/pain.).    Marland Kitchen isosorbide mononitrate (IMDUR) 120 MG 24 hr tablet Take 1 tablet (120 mg total) by mouth daily. 90 tablet 3  . ketotifen (ALAWAY) 0.025 % ophthalmic solution Place 1 drop into both eyes 2 (two) times daily as needed (for allergy eyes).    Marland Kitchen levothyroxine (SYNTHROID) 112 MCG tablet Take 112 mcg by mouth  daily before breakfast.    . loteprednol (LOTEMAX) 0.5 % ophthalmic suspension Place 1 drop into the left eye every other day.     . nitroGLYCERIN (NITROSTAT) 0.4 MG SL tablet Place 1 tablet (0.4 mg total) under the tongue every 5 (five) minutes x 3 doses as needed for chest pain. 25 tablet 4  . Omega-3 Fatty Acids (FISH OIL PO) Take 1,300 mg by mouth 2 (two) times daily.    . pantoprazole (PROTONIX) 40 MG tablet Take 40 mg by mouth daily after supper.     . ranolazine (RANEXA) 500 MG 12 hr tablet Take 1 tablet (500 mg total) by mouth 2 (two) times daily. 180 tablet 3  . terazosin (HYTRIN) 5 MG capsule Take 5 mg by mouth at bedtime.    . ticagrelor (BRILINTA) 90 MG TABS tablet Take 1 tablet (90 mg total) by mouth 2 (two) times daily. 180 tablet 2   No current facility-administered medications on file prior to visit.    Allergies  Allergen Reactions  . Bee Venom Swelling    Extreme Swelling of site    . Statins Other (See Comments)    Muscles aches with several statins  . Cleocin [Clindamycin Hcl] Rash    RASH IN BETWEEN FINGERS    Assessment/Plan:  1. Hyperlipidemia - LDL 106mg /dL on ezetimibe 10mg  daily, above goal 70mg /dL. Due to cirrhosis and recent diagnosis of Hepatitis A, will refer to GI regarding safest lipid-lowering option. Tbili elevated at 1.5 but LFTs normal on labs from 07/19/20. Provided pt with list of rosuvastatin 5mg  daily, PCSK9i therapy, and Welchol to bring to his GI appt this week to discuss. Low dose rosuvastatin would likely bring pt to goal. PCSK9i would be the most effective however cost seems to be a barrier in the setting of his other copays, also is not very inclined to give himself an injection. Welchol lacks CV outcomes data however is the only additional option that doesn't work mechanistically in the liver and may be preferred until his Hepatitis A resolves. He will stop OTC fish oil due to normal TG and lack of CV benefit. Will call pt after GI appt this week  to follow up with preferred lipid lowering therapy.  2. Chest pain - Pt reports some improvement in chest pain since starting Ranexa 500mg  BID and increasing Imdur to 120mg  at his last visit on 10/26, however he has still needed to use NTG multiple times in the past week. Will increase Ranexa to 1000mg  BID. Encouraged pt to monitor for improvement in chest pain. He will also continue to keep an eye on his BP which was normal at today's visit. Will call pt  in 2 weeks to assess for symptom improvement. Refill also sent in to [facility] for cost savings of $35.  3. GI - Pt endorses blood in his stool for 3 days earlier this week, no blood noted this morning. He has already reached out to his PCP who referred him to GI. He sees GI this Thursday and is aware to continue on his Brilinta and aspirin given recent stent placement. He will continue on Protonix as well and is aware to avoid NSAID use. Brilinta refill sent in to mail order pharmacy for cost savings of $50.   E. Supple, PharmD, BCACP, [place] [address] [place], [place] [address] Phone: [phone]; Fax: [phone] 09/18/2020 2:59 PM

## 2020-09-19 ENCOUNTER — Other Ambulatory Visit: Payer: Self-pay

## 2020-09-19 MED ORDER — ISOSORBIDE MONONITRATE ER 120 MG PO TB24
120.0000 mg | ORAL_TABLET | Freq: Every day | ORAL | 3 refills | Status: DC
Start: 2020-09-19 — End: 2021-10-21

## 2020-09-20 ENCOUNTER — Telehealth: Payer: Self-pay | Admitting: Pharmacist

## 2020-09-20 NOTE — Telephone Encounter (Signed)
Left message for pt to see if his gastroenterologist had a preference on lipid lowering medication to start after his visit today. See phone note 11/2 for details.

## 2020-09-21 MED ORDER — ROSUVASTATIN CALCIUM 5 MG PO TABS: 5.0000 mg | ORAL_TABLET | Freq: Every day | ORAL | 3 refills | Status: DC

## 2020-09-21 NOTE — Telephone Encounter (Signed)
Called and spoke with patient. He states that he saw GI yesterday. Saw Estill Bamberg a PA at [facility] on [address]. They stated that he could take rosuvastatin. Should have LFT monitored q 6 months. They told him that he does not have cirrhosis or Hep A. States it is scar tissue- fatty liver and that he tested positivie for Hep A bc of vaccine.  Will send rx for rosuvastatin 5mg  daily. Patient states he is going back to GI Dr in 4 weeks and they are going to do lab work. Did not want to schedule labwork with [place] yet incase they draw what we need.

## 2020-09-21 NOTE — Telephone Encounter (Signed)
Patient is returning call.  °

## 2020-09-21 NOTE — Addendum Note (Signed)
Addended by: Marcelle Overlie D on: 09/21/2020 04:23 PM   Modules accepted: Orders

## 2020-09-27 ENCOUNTER — Ambulatory Visit (HOSPITAL_COMMUNITY)
Admission: RE | Admit: 2020-09-27 | Discharge: 2020-09-27 | Disposition: A | Discharge status: Home / Self Care | Payer: Medicare Other | Source: Ambulatory Visit | Attending: Cardiology | Admitting: Cardiology

## 2020-09-27 DIAGNOSIS — R0989 Other specified symptoms and signs involving the circulatory and respiratory systems: Secondary | ICD-10-CM | POA: Diagnosis not present

## 2020-10-01 ENCOUNTER — Telehealth: Payer: Self-pay | Admitting: Pharmacist

## 2020-10-01 NOTE — Telephone Encounter (Signed)
Called pt to follow up from last visit 2 weeks ago. He reports some improvement in his chest pain since increasing his dose of Ranexa. Still notes occasional chest pain (can be triggered by cold weather), but has not used any of his NTG in the past week. He'll continue on Imdur 120mg  daily and Ranexa 1000mg  BID with prn NTG.  He also reports tolerating his rosuvastatin 5mg  daily. He's been taking this for the past week. GI is rechecking LFTs in another few weeks.  No med changes needed, pt aware to continue on current meds.

## 2020-10-02 ENCOUNTER — Other Ambulatory Visit: Payer: Self-pay

## 2020-10-02 ENCOUNTER — Ambulatory Visit (HOSPITAL_COMMUNITY): Payer: Medicare Other | Attending: Cardiovascular Disease

## 2020-10-02 DIAGNOSIS — I2510 Atherosclerotic heart disease of native coronary artery without angina pectoris: Secondary | ICD-10-CM

## 2020-10-02 LAB — ECHOCARDIOGRAM COMPLETE
Area-P 1/2: 3.39 cm2
S' Lateral: 3.93 cm

## 2020-10-05 ENCOUNTER — Telehealth: Payer: Self-pay

## 2020-10-05 ENCOUNTER — Telehealth: Payer: Self-pay | Admitting: Internal Medicine

## 2020-10-05 DIAGNOSIS — R001 Bradycardia, unspecified: Secondary | ICD-10-CM

## 2020-10-05 NOTE — Telephone Encounter (Signed)
Placed order for referral to EP.

## 2020-10-05 NOTE — Telephone Encounter (Signed)
See result note 10/05/20 of echo.

## 2020-10-05 NOTE — Telephone Encounter (Signed)
-----   Message from [name], MD sent at 10/04/2020  6:26 PM EST ----- Results: No WMAs, LV/RV Fx WNL Plan: Marked sinus bradycardia; if sx would send to EP (has some residual CP would not tolerate BB)  [name], MD

## 2020-10-05 NOTE — Telephone Encounter (Signed)
Patient returning call for echo results. 

## 2020-10-16 ENCOUNTER — Encounter: Payer: Self-pay | Admitting: Cardiology

## 2020-10-16 ENCOUNTER — Ambulatory Visit (INDEPENDENT_AMBULATORY_CARE_PROVIDER_SITE_OTHER): Payer: Medicare Other | Admitting: Cardiology

## 2020-10-16 ENCOUNTER — Other Ambulatory Visit: Payer: Self-pay

## 2020-10-16 VITALS — BP 128/56 | HR 54 | Ht 66.0 in | Wt 233.0 lb

## 2020-10-16 DIAGNOSIS — Z01812 Encounter for preprocedural laboratory examination: Secondary | ICD-10-CM

## 2020-10-16 DIAGNOSIS — I495 Sick sinus syndrome: Secondary | ICD-10-CM

## 2020-10-16 DIAGNOSIS — Z01810 Encounter for preprocedural cardiovascular examination: Secondary | ICD-10-CM | POA: Diagnosis not present

## 2020-10-16 MED ORDER — CLOPIDOGREL BISULFATE 75 MG PO TABS: ORAL_TABLET | ORAL | 3 refills | Status: DC

## 2020-10-16 NOTE — Patient Instructions (Addendum)
Medication Instructions:  Your physician has recommended you make the following change in your medication:  1. STOP Brilinita 2. START Plavix   Take 4 tablets (300 mg total) one time on the first day, then  Take 1 tablet (75 mg total) daily  *If you need a refill on your cardiac medications before your next appointment, please call your pharmacy*   Lab Work: Pre procedure blood work between 11/05/20 - 11/30/20.  You can stop by the [facility] office anytime between 7:30 am - 4:30 pm.  You do NOT need to be fasting.  Please send a mychart message with date you will be stopping by so that we can schedule the lab appointment.  If you have labs (blood work) drawn today and your tests are completely normal, you will receive your results only by: Marland Kitchen MyChart Message (if you have MyChart) OR . A paper copy in the mail If you have any lab test that is abnormal or we need to change your treatment, we will call you to review the results.   Testing/Procedures: Your physician has recommended that you have a pacemaker inserted. A pacemaker is a small device that is placed under the skin of your chest or abdomen to help control abnormal heart rhythms. This device uses electrical pulses to prompt the heart to beat at a normal rate. Pacemakers are used to treat heart rhythms that are too slow. Wire (leads) are attached to the pacemaker that goes into the chambers of you heart. This is done in the hospital and usually requires and overnight stay. Please see the instructions below located under "other instructions".    Follow-Up: At [facility], you and your health needs are our priority.  As part of our continuing mission to provide you with exceptional heart care, we have created designated Provider Care Teams.  These Care Teams include your primary Cardiologist (physician) and Advanced Practice Providers (APPs -  Physician Assistants and Nurse Practitioners) who all work together to provide you with the  care you need, when you need it.  Your next appointment:   10 day(s) after your pacemaker implant  The format for your next appointment:   In Person  Provider:   device clinic for a wound check    Thank you for choosing [facility]!!   Trinidad Curet, RN [phone]   Other Instructions   Implantable Device Instructions  You are scheduled for: Permanent pacemaker implant on 12/04/2020 with Dr. Curt Bears.  1.   Pre procedure testing-             A.  LAB WORK--- Pre procedure blood work between 11/05/20 - 11/30/20.  You can stop by the [facility] street office anytime between 7:30 am - 4:30 pm.  You do NOT need to be fasting.  Please send a mychart message with date you will be stopping by so that we can schedule the lab appointment.              B. COVID TEST-- On 12/01/20 @ 10:00 am - This is a Drive Up Visit at [address] [place], [place] [address].  Someone will direct you to the appropriate testing line. Stay in your car and someone will be with you shortly.   After you are tested please go home and self quarantine until the day of your procedure.    2. On the day of your procedure 12/04/2020 you will go to [facility] [id number]) at 8:30 am.  You will go to the main entrance [address] [employer]) and enter where the [employer] parking staff are.  You will check in at ADMITTING.  You may have one support person come in to the hospital with you.  They will be asked to wait in the waiting room.   3.   Do not eat or drink after midnight prior to your procedure.   4.   On the morning of your procedure do NOT take any medication.  5.  The night before your procedure and the morning of your procedure scrub your neck/chest with surgical scrub.  An instruction letter is included below   5.  Plan for an overnight stay.  If you use your phone frequently bring your phone charger.  When you are discharged you will need someone to drive you home.   6.  You will follow up with the  [facility] 10-14 days after your procedure. You will follow up with Dr. Curt Bears 91 days after your procedure.  These appointments will be made for you.   * If you have ANY questions after you get home, please call the office [phone] and ask for Kori Colin RN or send a MyChart message.   [facility] - Preparing For Surgery  Before surgery, you can play an important role. Because skin is not sterile, your skin needs to be as free of germs as possible. You can reduce the number of germs on your skin by washing with CHG (chlorahexidine gluconate) Soap before surgery.  CHG is an antiseptic cleaner which kills germs and bonds with the skin to continue killing germs even after washing.   Please do not use if you have an allergy to CHG or antibacterial soaps.  If your skin becomes reddened/irritated stop using the CHG.   Do not shave (including legs and underarms) for at least 48 hours prior to first CHG shower.  It is OK to shave your face.  Please follow these instructions carefully:  1.  Shower the night before surgery and the morning of surgery with CHG.  2.  If you choose to wash your hair, wash your hair first as usual with your normal shampoo.  3.  After you shampoo, rinse your hair and body thoroughly to remove the shampoo.  4.  Use CHG as you would any other liquid soap.  You can apply CHG directly to the skin and wash gently with a clean washcloth. 5.  Apply the CHG Soap to your body ONLY FROM THE NECK DOWN.  Do not use on open wounds or open sores.  Avoid contact with your eyes, ears, mouth and genitals (private parts).  Wash genitals (private parts) with your normal soap.  6.  Wash thoroughly, paying special attention to the area where your surgery will be performed.  7.  Thoroughly rinse your body with warm water from the neck down.   8.  DO NOT shower/wash with your normal soap after using and rinsing off the CHG soap.  9.  Pat yourself dry with a clean towel.            10.  Wear clean pajamas.           11.  Place clean sheets on your bed the night of your first shower and do not sleep with pets.  Day of Surgery: Do not apply any deodorants/lotions.  Please wear clean clothes to the hospital/surgery center.    Pacemaker Implantation, Adult Pacemaker implantation is a  procedure to place a pacemaker inside your chest. A pacemaker is a small computer that sends electrical signals to the heart and helps your heart beat normally. A pacemaker also stores information about your heart rhythms. You may need pacemaker implantation if you:  Have a slow heartbeat (bradycardia).  Faint (syncope).  Have shortness of breath (dyspnea) due to heart problems. The pacemaker attaches to your heart through a wire, called a lead. Sometimes just one lead is needed. Other times, there will be two leads. There are two types of pacemakers:  Transvenous pacemaker. This type is placed under the skin or muscle of your chest. The lead goes through a vein in the chest area to reach the inside of the heart.  Epicardial pacemaker. This type is placed under the skin or muscle of your chest or belly. The lead goes through your chest to the outside of the heart. Tell a health care provider about:  Any allergies you have.  All medicines you are taking, including vitamins, herbs, eye drops, creams, and over-the-counter medicines.  Any problems you or family members have had with anesthetic medicines.  Any blood or bone disorders you have.  Any surgeries you have had.  Any medical conditions you have.  Whether you are pregnant or may be pregnant. What are the risks? Generally, this is a safe procedure. However, problems may occur, including:  Infection.  Bleeding.  Failure of the pacemaker or the lead.  Collapse of a lung or bleeding into a lung.  Blood clot inside a blood vessel with a lead.  Damage to the heart.  Infection inside the heart (endocarditis).  Allergic  reactions to medicines. What happens before the procedure? Staying hydrated Follow instructions from your health care provider about hydration, which may include:  Up to 2 hours before the procedure - you may continue to drink clear liquids, such as water, clear fruit juice, black coffee, and plain tea. Eating and drinking restrictions Follow instructions from your health care provider about eating and drinking, which may include:  8 hours before the procedure - stop eating heavy meals or foods such as meat, fried foods, or fatty foods.  6 hours before the procedure - stop eating light meals or foods, such as toast or cereal.  6 hours before the procedure - stop drinking milk or drinks that contain milk.  2 hours before the procedure - stop drinking clear liquids. Medicines  Ask your health care provider about: ? Changing or stopping your regular medicines. This is especially important if you are taking diabetes medicines or blood thinners. ? Taking medicines such as aspirin and ibuprofen. These medicines can thin your blood. Do not take these medicines before your procedure if your health care provider instructs you not to.  You may be given antibiotic medicine to help prevent infection. General instructions  You will have a heart evaluation. This may include an electrocardiogram (ECG), chest X-ray, and heart imaging (echocardiogram,  or echo) tests.  You will have blood tests.  Do not use any products that contain nicotine or tobacco, such as cigarettes and e-cigarettes. If you need help quitting, ask your health care provider.  Plan to have someone take you home from the hospital or clinic.  If you will be going home right after the procedure, plan to have someone with you for 24 hours.  Ask your health care provider how your surgical site will be marked or identified. What happens during the procedure?  To reduce your risk of  infection: ? Your health care team will wash or  sanitize their hands. ? Your skin will be washed with soap. ? Hair may be removed from the surgical area.  An IV tube will be inserted into one of your veins.  You will be given one or more of the following: ? A medicine to help you relax (sedative). ? A medicine to numb the area (local anesthetic). ? A medicine to make you fall asleep (general anesthetic).  If you are getting a transvenous pacemaker: ? An incision will be made in your upper chest. ? A pocket will be made for the pacemaker. It may be placed under the skin or between layers of muscle. ? The lead will be inserted into a blood vessel that returns to the heart. ? While X-rays are taken by an imaging machine (fluoroscopy), the lead will be advanced through the vein to the inside of your heart. ? The other end of the lead will be tunneled under the skin and attached to the pacemaker.  If you are getting an epicardial pacemaker: ? An incision will be made near your ribs or breastbone (sternum) for the lead. ? The lead will be attached to the outside of your heart. ? Another incision will be made in your chest or upper belly to create a pocket for the pacemaker. ? The free end of the lead will be tunneled under the skin and attached to the pacemaker.  The transvenous or epicardial pacemaker will be tested. Imaging studies may be done to check the lead position.  The incisions will be closed with stitches (sutures), adhesive strips, or skin glue.  Bandages (dressing) will be placed over the incisions. The procedure may vary among health care providers and hospitals. What happens after the procedure?  Your blood pressure, heart rate, breathing rate, and blood oxygen level will be monitored until the medicines you were given have worn off.  You will be given antibiotics and pain medicine.  ECG and chest x-rays will be done.  You will wear a continuous type of ECG (Holter monitor) to check your heart rhythm.  Your health  care provider will program the pacemaker.  Do not drive for 24 hours if you received a sedative. This information is not intended to replace advice given to you by your health care provider. Make sure you discuss any questions you have with your health care provider. Document Revised: 07/23/2018 Document Reviewed: 04/16/2016 Elsevier Patient Education  [place].

## 2020-10-16 NOTE — Progress Notes (Signed)
Electrophysiology Office Note   Date:  10/16/2020   ID:  Jguadalupe, Opiela [birth year], MRN [id number]  PCP:  [name], MD  Cardiologist:  [name] Primary Electrophysiologist:  Malya Cirillo Meredith Leeds, MD    Chief Complaint: bradycardia   History of Present Illness: Austin Oliver is a 64 y.o. male who is being seen today for the evaluation of bradycardia at the request of [name] A*. Presenting today for electrophysiology evaluation.  He has a history of sinus bradycardia, aortic atherosclerosis, hyperlipidemia, statin intolerance, hypertension, and coronary artery disease.  He initially presented with unstable angina and was found to have an LAD lesion and is status post PCI 08/24/2020.  Today, he denies symptoms of palpitations, orthopnea, PND, lower extremity edema, claudication, dizziness, presyncope, syncope, bleeding, or neurologic sequela. The patient is tolerating medications without difficulties.  He continues to have chest pain, fatigue and feelings of tiredness as well as shortness of breath.  His chest pain occurs mainly in the mornings.  He is unable to take beta-blockers due to his resting bradycardia.  He does state that he goes to cardiac rehab, but feels quite tired and fatigued when he tries to work out.  He states that a few years ago, his heart rate was in the upper 40s to 50s, but has drifted down into the low 40s to at times high 30s at home.  This is associated with feeling fatigued.   Past Medical History:  Diagnosis Date  . Bradycardia   . Coronary artery calcification seen on CAT scan 01/14/2017  . Essential hypertension 09/11/2020  . Family history of thyroid problem   . GERD (gastroesophageal reflux disease)   . History of kidney stones   . Hyperlipidemia   . Hypothyroidism   . Osteoarthritis   . Shingles 2012  . SOB (shortness of breath) on exertion   . Thyroid disease    Past Surgical History:  Procedure Laterality  Date  . CORONARY STENT INTERVENTION N/A 08/24/2020   Procedure: CORONARY STENT INTERVENTION;  Surgeon: [name], MD;  Location: [facility];  Service: Cardiovascular;  Laterality: N/A;  . HEEL SPUR SURGERY Left 80's  . INTRAVASCULAR ULTRASOUND/IVUS N/A 08/24/2020   Procedure: Intravascular Ultrasound/IVUS;  Surgeon: [name], MD;  Location: [facility];  Service: Cardiovascular;  Laterality: N/A;  . KNEE SURGERY Bilateral    numerous times  . LEFT HEART CATH AND CORONARY ANGIOGRAPHY N/A 08/24/2020   Procedure: LEFT HEART CATH AND CORONARY ANGIOGRAPHY;  Surgeon: [name], MD;  Location: [place];  Service: Cardiovascular;  Laterality: N/A;  . NECK SURGERY  70's  . TOTAL HIP ARTHROPLASTY Left 03/09/2017   Procedure: LEFT TOTAL HIP ARTHROPLASTY ANTERIOR APPROACH;  Surgeon: Rod Can, MD;  Location: [facility];  Service: Orthopedics;  Laterality: Left;  Dr. requesting RNFA     Current Outpatient Medications  Medication Sig Dispense Refill  . acetaminophen (TYLENOL) 500 MG tablet Take 500 mg by mouth every 6 (six) hours as needed (for pain.).    Marland Kitchen aspirin 81 MG chewable tablet Chew 81 mg by mouth at bedtime.     Marland Kitchen ezetimibe (ZETIA) 10 MG tablet Take 10 mg by mouth daily.     Marland Kitchen gabapentin (NEURONTIN) 100 MG capsule Take 100-300 mg by mouth at bedtime.     Marland Kitchen ibuprofen (ADVIL) 200 MG tablet Take 400 mg by mouth every 8 (eight) hours as needed (inflammation/pain.).    Marland Kitchen isosorbide mononitrate (IMDUR) 120 MG  24 hr tablet Take 1 tablet (120 mg total) by mouth daily. 90 tablet 3  . ketotifen (ALAWAY) 0.025 % ophthalmic solution Place 1 drop into both eyes 2 (two) times daily as needed (for allergy eyes).    Marland Kitchen levothyroxine (SYNTHROID) 112 MCG tablet Take 112 mcg by mouth daily before breakfast.    . loteprednol (LOTEMAX) 0.5 % ophthalmic suspension Place 1 drop into the left eye every other day.     . nitroGLYCERIN (NITROSTAT) 0.4 MG SL tablet Place 1 tablet  (0.4 mg total) under the tongue every 5 (five) minutes x 3 doses as needed for chest pain. 25 tablet 4  . pantoprazole (PROTONIX) 40 MG tablet Take 40 mg by mouth daily after supper.     . ranolazine (RANEXA) 1000 MG SR tablet Take 1 tablet (1,000 mg total) by mouth 2 (two) times daily. 180 tablet 3  . rosuvastatin (CRESTOR) 5 MG tablet Take 1 tablet (5 mg total) by mouth daily. 90 tablet 3  . terazosin (HYTRIN) 5 MG capsule Take 5 mg by mouth at bedtime.    . clopidogrel (PLAVIX) 75 MG tablet Take 300 mg one time (first day), then take 1 tablet (75 mg total) ONCE daily. 30 tablet 3   No current facility-administered medications for this visit.    Allergies:   Bee venom, Statins, and Cleocin [clindamycin hcl]   Social History:  The patient  reports that he quit smoking about 3 years ago. He has never used smokeless tobacco. He reports that he does not drink alcohol and does not use drugs.   Family History:  The patient's family history includes COPD in his father; Dementia in his mother; Heart Problems in his father; Lung cancer in his father.    ROS:  Please see the history of present illness.   Otherwise, review of systems is positive for none.   All other systems are reviewed and negative.    PHYSICAL EXAM: VS:  BP (!) 128/56   Pulse (!) 54   Ht 5\' 6"  (1.676 m)   Wt 233 lb (105.7 kg)   SpO2 98%   BMI 37.61 kg/m  , BMI Body mass index is 37.61 kg/m. GEN: Well nourished, well developed, in no acute distress  HEENT: normal  Neck: no JVD, carotid bruits, or masses Cardiac: RRR; no murmurs, rubs, or gallops,no edema  Respiratory:  clear to auscultation bilaterally, normal work of breathing GI: soft, nontender, nondistended, + BS MS: no deformity or atrophy  Skin: warm and dry Neuro:  Strength and sensation are intact Psych: euthymic mood, full affect  EKG:  EKG is ordered today. Personal review of the ekg ordered shows sinus rhythm, rate 46  Recent Labs: 03/08/2020: ALT  53 08/22/2020: Hemoglobin 12.0; Platelets 136 09/11/2020: BUN 15; Creatinine, Ser 1.10; NT-Pro BNP 115; Potassium 4.1; Sodium 143    Lipid Panel     Component Value Date/Time   CHOL 158 08/22/2020 1044   TRIG 88 08/22/2020 1044   HDL 35 (L) 08/22/2020 1044   CHOLHDL 4.5 08/22/2020 1044   LDLCALC 106 (H) 08/22/2020 1044     Wt Readings from Last 3 Encounters:  10/16/20 233 lb (105.7 kg)  09/11/20 239 lb (108.4 kg)  08/24/20 235 lb (106.6 kg)      Other studies Reviewed: Additional studies/ records that were reviewed today include: TTE 10/02/20  Review of the above records today demonstrates:  1. Left ventricular ejection fraction, by estimation, is 50 to 55%. The  left  ventricle has low normal function. The left ventricle has no regional  wall motion abnormalities. The left ventricular internal cavity size was  mildly dilated. Left ventricular  diastolic parameters were normal.  2. Right ventricular systolic function is normal. The right ventricular  size is normal. Tricuspid regurgitation signal is inadequate for assessing  PA pressure.  3. Left atrial size was mildly dilated.  4. The mitral valve is normal in structure. No evidence of mitral valve  regurgitation.  5. The aortic valve is tricuspid. There is mild calcification of the  aortic valve. There is mild thickening of the aortic valve. Aortic valve  regurgitation is trivial. Mild aortic valve sclerosis is present, with no  evidence of aortic valve stenosis.  6. The inferior vena cava is normal in size with greater than 50%  respiratory variability, suggesting right atrial pressure of 3 mmHg.   LHC 10/85/21 1. Severe single-vessel coronary artery disease with sequential 90% ostial/proximal, 40% mid, and 50% mid/distal LAD stenoses as well as 60% D3 lesion. 2. Moderate, non-obstructive coronary artery disease involving the LCx and RCA. 3. Normal left ventricular systolic function with upper normal filling  pressure. 4. Successful PCI to the ostial/proximal LAD using Resolute Onyx 3.0 x 12 mm drug-eluting stent (postdilated to 3.6 mm) with 0% residual stenosis and TIMI-3 flow.  ASSESSMENT AND PLAN:  1.  Sick sinus syndrome: Heart rate today in clinic is 46.  He also brings in heart rate recordings from home that are down in the upper 30s.  He has symptoms of fatigue and shortness of breath.  This is possibly due to chronotropic incompetence as well.  Due to that, we Dre Gamino plan for pacemaker implant.  Risks and benefits were discussed include bleeding, infection, pneumothorax, tamponade, lead dislodgment, among others.  He understands these risks and is agreed to the procedure.  We Jazell Rosenau plan to stop his Brilinta and load him on Plavix.  2.  Coronary artery disease: Is continued to have chest pain despite his Imdur.  He is currently on Brilinta and aspirin.  We Nishan Ovens switch him to Plavix for pacemaker implantation.   Case discussed with primary cardiology  Current medicines are reviewed at length with the patient today.   The patient does not have concerns regarding his medicines.  The following changes were made today: Stop Brilinta, start Plavix  Labs/ tests ordered today include:  Orders Placed This Encounter  Procedures  . Basic metabolic panel  . CBC  . EKG 12-Lead     Disposition:   FU with Jasyah Theurer 3 months  Signed, Terrie Grajales Meredith Leeds, MD  10/16/2020 11:48 AM     [facility] [address] [place] [address] [phone] (office) [phone] (fax)

## 2020-10-24 ENCOUNTER — Telehealth: Payer: Self-pay | Admitting: Cardiology

## 2020-10-24 NOTE — Telephone Encounter (Signed)
Pt informed that I would let Dr. Curt Bears know he would like a MRI compatible PPM. Advised pt to have cardiac rehab fax clearance request to our office.  Advised to have them explain what he does specifically. Patient verbalized understanding and agreeable to plan.

## 2020-10-24 NOTE — Telephone Encounter (Signed)
Patient was calling to talk with Dr. Curt Bears or nurse about taking MRI's and also receiving a letter regarding cardiac clearance. Please call back

## 2020-10-24 NOTE — Telephone Encounter (Signed)
Spoke to pt.   Reports that it is a Rx bottle, not factory bottle w/ seal.  Aware we cannot take to give to our patients. Advised to hold onto medication (which cost him $$88) and that he can discuss restarting post PPM implant. Pt agreeable to plan.

## 2020-10-26 ENCOUNTER — Telehealth: Payer: Self-pay | Admitting: Pharmacist

## 2020-10-26 DIAGNOSIS — T466X5A Adverse effect of antihyperlipidemic and antiarteriosclerotic drugs, initial encounter: Secondary | ICD-10-CM

## 2020-10-26 NOTE — Telephone Encounter (Signed)
Called pt to follow up with labs. He states he sees GI next week on 12/14 and they will check LFTs then (has been on rosuvastatin for 5 weeks now). Tolerating his meds well and reports an improvement in chest pain, hasn't had an episode of chest pain in over a week. Will keep an eye out for LFT results next week.

## 2020-11-01 NOTE — Telephone Encounter (Signed)
Called GI office, they didn't check any labs for pt during his 12/14 appt. Called pt and left message, he will need to have LFTs checked at our office.

## 2020-11-01 NOTE — Addendum Note (Signed)
Addended by: Ramir Malerba E on: 11/01/2020 10:53 AM   Modules accepted: Orders

## 2020-11-02 NOTE — Telephone Encounter (Signed)
Pt called clinic, his wife tested positive for covid today. His rapid test was negative. Moved lab appt out to 1/14. He is aware to let us know if he tests positive and we need to move his pacemaker procedure that's currently scheduled for 1/18.

## 2020-11-02 NOTE — Telephone Encounter (Signed)
Pt states GI didn't draw labs because his PCP did on 11/29. Reviewed labs they checked and they did not check LFTs either. Pt needs pre op labs done before his pacemaker on 1/18. Was exposed to covid and is being tested today. Scheduled LFTs/preop labs on 1/3.

## 2020-11-19 ENCOUNTER — Other Ambulatory Visit: Payer: Medicare Other

## 2020-11-27 ENCOUNTER — Ambulatory Visit: Payer: Medicare Other | Admitting: Internal Medicine

## 2020-11-29 ENCOUNTER — Telehealth: Payer: Self-pay | Admitting: *Deleted

## 2020-11-29 NOTE — Telephone Encounter (Signed)
Pt made aware his case has been moved to 2nd case on 1/18 and to arrive to [facility] at 8:30 am. Patient verbalized understanding and agreeable to plan.

## 2020-11-30 ENCOUNTER — Other Ambulatory Visit: Payer: Medicare Other | Admitting: *Deleted

## 2020-11-30 ENCOUNTER — Other Ambulatory Visit: Payer: Self-pay

## 2020-11-30 DIAGNOSIS — T466X5A Adverse effect of antihyperlipidemic and antiarteriosclerotic drugs, initial encounter: Secondary | ICD-10-CM

## 2020-11-30 DIAGNOSIS — Z01812 Encounter for preprocedural laboratory examination: Secondary | ICD-10-CM

## 2020-11-30 DIAGNOSIS — I495 Sick sinus syndrome: Secondary | ICD-10-CM

## 2020-11-30 DIAGNOSIS — G720 Drug-induced myopathy: Secondary | ICD-10-CM

## 2020-12-01 ENCOUNTER — Other Ambulatory Visit (HOSPITAL_COMMUNITY)
Admission: RE | Admit: 2020-12-01 | Discharge: 2020-12-01 | Disposition: A | Discharge status: Home / Self Care | Payer: Medicare Other | Source: Ambulatory Visit | Attending: Cardiology | Admitting: Cardiology

## 2020-12-01 DIAGNOSIS — Z01812 Encounter for preprocedural laboratory examination: Secondary | ICD-10-CM | POA: Diagnosis present

## 2020-12-01 DIAGNOSIS — Z20822 Contact with and (suspected) exposure to covid-19: Secondary | ICD-10-CM | POA: Insufficient documentation

## 2020-12-01 LAB — HEPATIC FUNCTION PANEL
ALT: 20 IU/L (ref 0–44)
AST: 23 IU/L (ref 0–40)
Albumin: 4.3 g/dL (ref 3.8–4.8)
Alkaline Phosphatase: 44 IU/L (ref 44–121)
Bilirubin Total: 2.6 mg/dL — ABNORMAL HIGH (ref 0.0–1.2)
Bilirubin, Direct: 0.32 mg/dL (ref 0.00–0.40)
Total Protein: 6.7 g/dL (ref 6.0–8.5)

## 2020-12-01 LAB — BASIC METABOLIC PANEL
BUN/Creatinine Ratio: 16 (ref 10–24)
BUN: 22 mg/dL (ref 8–27)
CO2: 26 mmol/L (ref 20–29)
Calcium: 8.9 mg/dL (ref 8.6–10.2)
Chloride: 105 mmol/L (ref 96–106)
Creatinine, Ser: 1.4 mg/dL — ABNORMAL HIGH (ref 0.76–1.27)
GFR calc Af Amer: 61 mL/min/{1.73_m2} (ref >59)
GFR calc non Af Amer: 52 mL/min/{1.73_m2} — ABNORMAL LOW (ref >59)
Glucose: 138 mg/dL — ABNORMAL HIGH (ref 65–99)
Potassium: 4 mmol/L (ref 3.5–5.2)
Sodium: 142 mmol/L (ref 134–144)

## 2020-12-01 LAB — SARS CORONAVIRUS 2 (TAT 6-24 HRS): SARS Coronavirus 2: NEGATIVE

## 2020-12-01 LAB — CBC
Hematocrit: 37 % — ABNORMAL LOW (ref 37.5–51.0)
Hemoglobin: 12.5 g/dL — ABNORMAL LOW (ref 13.0–17.7)
MCH: 30.8 pg (ref 26.6–33.0)
MCHC: 33.8 g/dL (ref 31.5–35.7)
MCV: 91 fL (ref 79–97)
Platelets: 102 10*3/uL — ABNORMAL LOW (ref 150–450)
RBC: 4.06 x10E6/uL — ABNORMAL LOW (ref 4.14–5.80)
RDW: 12.2 % (ref 11.6–15.4)
WBC: 3.5 10*3/uL (ref 3.4–10.8)

## 2020-12-03 NOTE — Progress Notes (Signed)
Instructed patient on the following items: Arrival time 0830 Nothing to eat or drink after midnight No meds AM of procedure Responsible person to drive you home and stay with you for 24 hrs Wash with special soap night before and morning of procedure  

## 2020-12-04 ENCOUNTER — Ambulatory Visit (HOSPITAL_COMMUNITY): Payer: Medicare Other

## 2020-12-04 ENCOUNTER — Ambulatory Visit (HOSPITAL_COMMUNITY)
Admission: RE | Admit: 2020-12-04 | Discharge: 2020-12-04 | Disposition: A | Discharge status: Home / Self Care | Payer: Medicare Other | Attending: Cardiology | Admitting: Cardiology

## 2020-12-04 ENCOUNTER — Encounter (HOSPITAL_COMMUNITY): Payer: Self-pay | Admitting: Cardiology

## 2020-12-04 ENCOUNTER — Encounter (HOSPITAL_COMMUNITY)
Admission: RE | Disposition: A | Discharge status: Home / Self Care | Payer: Medicare Other | Source: Home / Self Care | Attending: Cardiology

## 2020-12-04 ENCOUNTER — Other Ambulatory Visit: Payer: Self-pay

## 2020-12-04 DIAGNOSIS — E785 Hyperlipidemia, unspecified: Secondary | ICD-10-CM | POA: Diagnosis not present

## 2020-12-04 DIAGNOSIS — I10 Essential (primary) hypertension: Secondary | ICD-10-CM | POA: Diagnosis not present

## 2020-12-04 DIAGNOSIS — Z87891 Personal history of nicotine dependence: Secondary | ICD-10-CM | POA: Diagnosis not present

## 2020-12-04 DIAGNOSIS — I25110 Atherosclerotic heart disease of native coronary artery with unstable angina pectoris: Secondary | ICD-10-CM | POA: Insufficient documentation

## 2020-12-04 DIAGNOSIS — Z888 Allergy status to other drugs, medicaments and biological substances status: Secondary | ICD-10-CM | POA: Diagnosis not present

## 2020-12-04 DIAGNOSIS — Z881 Allergy status to other antibiotic agents status: Secondary | ICD-10-CM | POA: Diagnosis not present

## 2020-12-04 DIAGNOSIS — I495 Sick sinus syndrome: Secondary | ICD-10-CM

## 2020-12-04 DIAGNOSIS — Z95818 Presence of other cardiac implants and grafts: Secondary | ICD-10-CM

## 2020-12-04 HISTORY — PX: PACEMAKER IMPLANT: EP1218

## 2020-12-04 SURGERY — PACEMAKER IMPLANT

## 2020-12-04 MED ORDER — SODIUM CHLORIDE 0.9 % IV SOLN
80.0000 mg | INTRAVENOUS | Status: AC
  Administered 2020-12-04: 80 mg

## 2020-12-04 MED ORDER — MIDAZOLAM HCL 5 MG/5ML IJ SOLN
INTRAMUSCULAR | Status: AC
  Filled 2020-12-04: qty 5

## 2020-12-04 MED ORDER — LIDOCAINE HCL (PF) 1 % IJ SOLN
INTRAMUSCULAR | Status: AC
  Filled 2020-12-04: qty 60

## 2020-12-04 MED ORDER — SODIUM CHLORIDE 0.9 % IV SOLN: INTRAVENOUS | Status: DC

## 2020-12-04 MED ORDER — CEFAZOLIN SODIUM-DEXTROSE 1-4 GM/50ML-% IV SOLN
INTRAVENOUS | Status: AC
  Administered 2020-12-04: 1 g via INTRAVENOUS
  Filled 2020-12-04: qty 50

## 2020-12-04 MED ORDER — CEFAZOLIN SODIUM-DEXTROSE 1-4 GM/50ML-% IV SOLN: 1.0000 g | Freq: Four times a day (QID) | INTRAVENOUS | Status: DC

## 2020-12-04 MED ORDER — SODIUM CHLORIDE 0.9 % IV SOLN
INTRAVENOUS | Status: AC
  Filled 2020-12-04: qty 2

## 2020-12-04 MED ORDER — ONDANSETRON HCL 4 MG/2ML IJ SOLN: 4.0000 mg | Freq: Four times a day (QID) | INTRAMUSCULAR | Status: DC | PRN

## 2020-12-04 MED ORDER — FENTANYL CITRATE (PF) 100 MCG/2ML IJ SOLN
INTRAMUSCULAR | Status: DC | PRN
  Administered 2020-12-04: 25 ug via INTRAVENOUS

## 2020-12-04 MED ORDER — ACETAMINOPHEN 325 MG PO TABS: 325.0000 mg | ORAL_TABLET | ORAL | Status: DC | PRN

## 2020-12-04 MED ORDER — CHLORHEXIDINE GLUCONATE 4 % EX LIQD: 4.0000 "application " | Freq: Once | CUTANEOUS | Status: DC

## 2020-12-04 MED ORDER — HEPARIN (PORCINE) IN NACL 1000-0.9 UT/500ML-% IV SOLN
INTRAVENOUS | Status: DC | PRN
  Administered 2020-12-04: 500 mL

## 2020-12-04 MED ORDER — CEFAZOLIN SODIUM-DEXTROSE 2-4 GM/100ML-% IV SOLN
2.0000 g | INTRAVENOUS | Status: AC
  Administered 2020-12-04: 2 g via INTRAVENOUS

## 2020-12-04 MED ORDER — MIDAZOLAM HCL 5 MG/5ML IJ SOLN
INTRAMUSCULAR | Status: DC | PRN
  Administered 2020-12-04: 1 mg via INTRAVENOUS

## 2020-12-04 MED ORDER — CEFAZOLIN SODIUM-DEXTROSE 2-4 GM/100ML-% IV SOLN
INTRAVENOUS | Status: AC
  Filled 2020-12-04: qty 100

## 2020-12-04 MED ORDER — LIDOCAINE HCL (PF) 1 % IJ SOLN
INTRAMUSCULAR | Status: DC | PRN
  Administered 2020-12-04: 50 mL

## 2020-12-04 MED ORDER — FENTANYL CITRATE (PF) 100 MCG/2ML IJ SOLN
INTRAMUSCULAR | Status: AC
  Filled 2020-12-04: qty 2

## 2020-12-04 SURGICAL SUPPLY — 7 items
CABLE SURGICAL S-101-97-12 (CABLE) ×2 IMPLANT
LEAD TENDRIL MRI 52CM LPA1200M (Lead) ×1 IMPLANT
LEAD TENDRIL MRI 58CM LPA1200M (Lead) ×2 IMPLANT
PACEMAKER ASSURITY DR-RF (Pacemaker) ×2 IMPLANT
PAD PRO RADIOLUCENT 2001M-C (PAD) ×2 IMPLANT
SHEATH 8FR PRELUDE SNAP 13 (SHEATH) ×4 IMPLANT
TRAY PACEMAKER INSERTION (PACKS) ×2 IMPLANT

## 2020-12-04 NOTE — Progress Notes (Addendum)
Discharge instructions reviewed with pt. Voices understanding.. Attempted to call Vickii Chafe (pt wife) message left for her to call us back.

## 2020-12-04 NOTE — H&P (Signed)
Electrophysiology Office Note   Date:  12/04/2020   ID:  Somnang, Yellen Mar 15, 1956, MRN [id number]  PCP:  [name], MD  Cardiologist:  [name] Primary Electrophysiologist:  [place] Meredith Leeds, MD    Chief Complaint: bradycardia   History of Present Illness: Austin Oliver is a 65 y.o. male who is being seen today for the evaluation of bradycardia at the request of No ref. provider found. Presenting today for electrophysiology evaluation.  He has a history of sinus bradycardia, aortic atherosclerosis, hyperlipidemia, statin intolerance, hypertension, and coronary artery disease.  He initially presented with unstable angina and was found to have an LAD lesion and is status post PCI 08/24/2020.  Today, denies symptoms of palpitations, chest pain, shortness of breath, orthopnea, PND, lower extremity edema, claudication, dizziness, presyncope, syncope, bleeding, or neurologic sequela. The patient is tolerating medications without difficulties. Plan pacemaker today. For sick sinus syndrome   Past Medical History:  Diagnosis Date  . Bradycardia   . Coronary artery calcification seen on CAT scan 01/14/2017  . Essential hypertension 09/11/2020  . Family history of thyroid problem   . GERD (gastroesophageal reflux disease)   . History of kidney stones   . Hyperlipidemia   . Hypothyroidism   . Osteoarthritis   . Shingles 2012  . SOB (shortness of breath) on exertion   . Thyroid disease    Past Surgical History:  Procedure Laterality Date  . CORONARY STENT INTERVENTION N/A 08/24/2020   Procedure: CORONARY STENT INTERVENTION;  Surgeon: [name], MD;  Location: [facility];  Service: Cardiovascular;  Laterality: N/A;  . HEEL SPUR SURGERY Left 80's  . INTRAVASCULAR ULTRASOUND/IVUS N/A 08/24/2020   Procedure: Intravascular Ultrasound/IVUS;  Surgeon: [name], MD;  Location: [facility];  Service: Cardiovascular;  Laterality: N/A;  . KNEE  SURGERY Bilateral    numerous times  . LEFT HEART CATH AND CORONARY ANGIOGRAPHY N/A 08/24/2020   Procedure: LEFT HEART CATH AND CORONARY ANGIOGRAPHY;  Surgeon: [name], MD;  Location: [facility];  Service: Cardiovascular;  Laterality: N/A;  . NECK SURGERY  70's  . TOTAL HIP ARTHROPLASTY Left 03/09/2017   Procedure: LEFT TOTAL HIP ARTHROPLASTY ANTERIOR APPROACH;  Surgeon: Rod Can, MD;  Location: [facility];  Service: Orthopedics;  Laterality: Left;  Dr. requesting RNFA     Current Facility-Administered Medications  Medication Dose Route Frequency Provider Last Rate Last Admin  . 0.9 %  sodium chloride infusion   Intravenous Continuous [name], MD 50 mL/hr at 12/04/20 0908 New Bag at 12/04/20 0908  . ceFAZolin (ANCEF) IVPB 2g/100 mL premix  2 g Intravenous On Call Laurisa Sahakian Hassell Done, MD      . chlorhexidine (HIBICLENS) 4 % liquid 4 application  4 application Topical Once Cara Aguino Hassell Done, MD      . gentamicin (GARAMYCIN) 80 mg in sodium chloride 0.9 % 500 mL irrigation  80 mg Irrigation On Call Ladawna Walgren, Ocie Doyne, MD        Allergies:   Bee venom, Statins, and Cleocin [clindamycin hcl]   Social History:  The patient  reports that he quit smoking about 4 years ago. He has never used smokeless tobacco. He reports that he does not drink alcohol and does not use drugs.   Family History:  The patient's family history includes COPD in his father; Dementia in his mother; Heart Problems in his father; Lung cancer in his father.   ROS:  Please see the history of  present illness.   Otherwise, review of systems is positive for none.   All other systems are reviewed and negative.   PHYSICAL EXAM: VS:  BP (!) 142/57 (BP Location: Right Arm)   Pulse (!) 42   Temp 97.6 F (36.4 C) (Oral)   Resp 16   Ht 5\' 7"  (1.702 m)   Wt 106.6 kg   SpO2 100%   BMI 36.81 kg/m  , BMI Body mass index is 36.81 kg/m. GEN: Well nourished, well developed, in no acute distress   HEENT: normal  Neck: no JVD, carotid bruits, or masses Cardiac: RRR; no murmurs, rubs, or gallops,no edema  Respiratory:  clear to auscultation bilaterally, normal work of breathing GI: soft, nontender, nondistended, + BS MS: no deformity or atrophy  Skin: warm and dry Neuro:  Strength and sensation are intact Psych: euthymic mood, full affect  Labs: 09/11/2020: NT-Pro BNP 115 11/30/2020: ALT 20; BUN 22; Creatinine, Ser 1.40; Hemoglobin 12.5; Platelets 102; Potassium 4.0; Sodium 142    Lipid Panel     Component Value Date/Time   CHOL 158 08/22/2020 1044   TRIG 88 08/22/2020 1044   HDL 35 (L) 08/22/2020 1044   CHOLHDL 4.5 08/22/2020 1044   LDLCALC 106 (H) 08/22/2020 1044     Wt Readings from Last 3 Encounters:  12/04/20 106.6 kg  10/16/20 105.7 kg  09/11/20 108.4 kg      Other studies Reviewed: Additional studies/ records that were reviewed today include: TTE 10/02/20  Review of the above records today demonstrates:  1. Left ventricular ejection fraction, by estimation, is 50 to 55%. The  left ventricle has low normal function. The left ventricle has no regional  wall motion abnormalities. The left ventricular internal cavity size was  mildly dilated. Left ventricular  diastolic parameters were normal.  2. Right ventricular systolic function is normal. The right ventricular  size is normal. Tricuspid regurgitation signal is inadequate for assessing  PA pressure.  3. Left atrial size was mildly dilated.  4. The mitral valve is normal in structure. No evidence of mitral valve  regurgitation.  5. The aortic valve is tricuspid. There is mild calcification of the  aortic valve. There is mild thickening of the aortic valve. Aortic valve  regurgitation is trivial. Mild aortic valve sclerosis is present, with no  evidence of aortic valve stenosis.  6. The inferior vena cava is normal in size with greater than 50%  respiratory variability, suggesting right atrial  pressure of 3 mmHg.   LHC 10/85/21 1. Severe single-vessel coronary artery disease with sequential 90% ostial/proximal, 40% mid, and 50% mid/distal LAD stenoses as well as 60% D3 lesion. 2. Moderate, non-obstructive coronary artery disease involving the LCx and RCA. 3. Normal left ventricular systolic function with upper normal filling pressure. 4. Successful PCI to the ostial/proximal LAD using Resolute Onyx 3.0 x 12 mm drug-eluting stent (postdilated to 3.6 mm) with 0% residual stenosis and TIMI-3 flow.  ASSESSMENT AND PLAN:  1.  Sick sinus syndrome: LETCHER SCHWEIKERT has presented today for surgery, with the diagnosis of sick sinus syndrome.  The various methods of treatment have been discussed with the patient and family. After consideration of risks, benefits and other options for treatment, the patient has consented to  Procedure(s): Pacemaker implant as a surgical intervention .  Risks include but not limited to bleeding, infection, pneumothorax, perforation, tamponade, vascular damage, renal failure, MI, stroke, death, and lead dislodgement . The patient's history has been reviewed, patient examined, no change  in status, stable for surgery.  I have reviewed the patient's chart and labs.  Questions were answered to the patient's satisfaction.    Javen Ridings Curt Bears, MD 12/04/2020 10:20 AM

## 2020-12-04 NOTE — Discharge Instructions (Signed)
After Your Pacemaker NEXT DOSE OF PLAVIX IS----    You have a [employer] Pacemaker  ACTIVITY  Do not lift your arm above shoulder height for 1 week after your procedure. After 7 days, you may progress as below.   You should remove your sling 24 hours after your procedure, unless otherwise instructed by your provider.     Tuesday December 11, 2020  Wednesday December 12, 2020 Thursday December 13, 2020 Friday December 14, 2020    Do not lift, push, pull, or carry anything over 10 pounds with the affected arm until 6 weeks (Tuesday January 15, 2021 ) after your procedure.    Do NOT DRIVE until you have been seen for your wound check, or as long as instructed by your healthcare provider.    Ask your healthcare provider when you can go back to work   INCISION/Dressing  If you are on a blood thinner such as Coumadin, Xarelto, Eliquis, Plavix, or Pradaxa please confirm with your provider when this should be resumed.   If large square, outer bandage is left in place, this can be removed after 24 hours from your procedure. Do not remove steri-strips or glue as below.    Monitor your Pacemaker site for redness, swelling, and drainage. Call the device clinic at [phone] if you experience these symptoms or fever/chills.   If your incision is sealed with Steri-strips or staples, you may shower 10 days after your procedure or when told by your provider. Do not remove the steri-strips or let the shower hit directly on your site. You may wash around your site with soap and water.     Avoid lotions, ointments, or perfumes over your incision until it is well-healed.   You may use a hot tub or a pool AFTER your wound check appointment if the incision is completely closed.   PAcemaker Alerts:  Some alerts are vibratory and others beep. These are NOT emergencies. Please call our office to let us know. If this occurs at night or on weekends, it can wait until the next business day. Send a remote  transmission.   If your device is capable of reading fluid status (for heart failure), you will be offered monthly monitoring to review this with you.   DEVICE MANAGEMENT  Remote monitoring is used to monitor your pacemaker from home. This monitoring is scheduled every 91 days by our office. It allows [place] to keep an eye on the functioning of your device to ensure it is working properly. You will routinely see your Electrophysiologist annually (more often if necessary).    You should receive your ID card for your new device in 4-8 weeks. Keep this card with you at all times once received. Consider wearing a medical alert bracelet or necklace.   Your Pacemaker may be MRI compatible. This will be discussed at your next office visit/wound check.  You should avoid contact with strong electric or magnetic fields.    Do not use amateur (ham) radio equipment or electric (arc) welding torches. MP3 player headphones with magnets should not be used. Some devices are safe to use if held at least 12 inches (30 cm) from your Pacemaker. These include power tools, lawn mowers, and speakers. If you are unsure if something is safe to use, ask your health care provider.   When using your cell phone, hold it to the ear that is on the opposite side from the Pacemaker. Do not leave your cell phone in a pocket  over the Pacemaker.   You may safely use electric blankets, heating pads, computers, and microwave ovens.  Call the office right away if:  You have chest pain.  You feel more short of breath than you have felt before.  You feel more light-headed than you have felt before.  Your incision starts to open up.  This information is not intended to replace advice given to you by your health care provider. Make sure you discuss any questions you have with your health care provider.

## 2020-12-05 ENCOUNTER — Telehealth: Payer: Self-pay

## 2020-12-05 NOTE — Telephone Encounter (Signed)
Follow-up after same day discharge: Implant date: 12/04/20 MD: Allegra Lai, MD Device: SJM PPM Location: Left chest   Wound check visit: 12/18/20 90 day MD follow-up: 03/06/21  Remote Transmission received:Yes 1/18  Dressing removed: Not yet, pt plans to remove tonight.  Educated to remove dressing tonight and s/s to monitor for.    Activity restrictions reviewed.

## 2020-12-18 ENCOUNTER — Ambulatory Visit: Payer: Medicare Other | Admitting: Internal Medicine

## 2020-12-18 ENCOUNTER — Encounter: Payer: Self-pay | Admitting: Internal Medicine

## 2020-12-18 ENCOUNTER — Ambulatory Visit (INDEPENDENT_AMBULATORY_CARE_PROVIDER_SITE_OTHER): Payer: Medicare Other | Admitting: Emergency Medicine

## 2020-12-18 ENCOUNTER — Other Ambulatory Visit: Payer: Self-pay

## 2020-12-18 VITALS — BP 114/60 | HR 64 | Ht 67.0 in | Wt 234.2 lb

## 2020-12-18 DIAGNOSIS — I10 Essential (primary) hypertension: Secondary | ICD-10-CM

## 2020-12-18 DIAGNOSIS — G720 Drug-induced myopathy: Secondary | ICD-10-CM

## 2020-12-18 DIAGNOSIS — I700 Atherosclerosis of aorta: Secondary | ICD-10-CM

## 2020-12-18 DIAGNOSIS — I2510 Atherosclerotic heart disease of native coronary artery without angina pectoris: Secondary | ICD-10-CM

## 2020-12-18 DIAGNOSIS — E782 Mixed hyperlipidemia: Secondary | ICD-10-CM

## 2020-12-18 DIAGNOSIS — I495 Sick sinus syndrome: Secondary | ICD-10-CM

## 2020-12-18 DIAGNOSIS — T466X5D Adverse effect of antihyperlipidemic and antiarteriosclerotic drugs, subsequent encounter: Secondary | ICD-10-CM

## 2020-12-18 DIAGNOSIS — T466X5A Adverse effect of antihyperlipidemic and antiarteriosclerotic drugs, initial encounter: Secondary | ICD-10-CM

## 2020-12-18 LAB — CUP PACEART INCLINIC DEVICE CHECK
Battery Remaining Longevity: 78 mo
Battery Voltage: 3.02 V
Brady Statistic RA Percent Paced: 97 %
Brady Statistic RV Percent Paced: 0.31 %
Date Time Interrogation Session: 20220201151205
Implantable Lead Implant Date: 20220118
Implantable Lead Implant Date: 20220118
Implantable Lead Location: 753859
Implantable Lead Location: 753860
Implantable Pulse Generator Implant Date: 20220118
Lead Channel Impedance Value: 437.5 Ohm
Lead Channel Impedance Value: 575 Ohm
Lead Channel Pacing Threshold Amplitude: 0.5 V
Lead Channel Pacing Threshold Amplitude: 0.75 V
Lead Channel Pacing Threshold Pulse Width: 0.5 ms
Lead Channel Pacing Threshold Pulse Width: 0.5 ms
Lead Channel Sensing Intrinsic Amplitude: 5 mV
Lead Channel Sensing Intrinsic Amplitude: 8.3 mV
Lead Channel Setting Pacing Amplitude: 3.5 V
Lead Channel Setting Pacing Amplitude: 3.5 V
Lead Channel Setting Pacing Pulse Width: 0.5 ms
Lead Channel Setting Sensing Sensitivity: 2 mV
Pulse Gen Model: 2272
Pulse Gen Serial Number: 3891376

## 2020-12-18 NOTE — Patient Instructions (Addendum)
Medication Instructions:  Your physician recommends that you continue on your current medications as directed. Please refer to the Current Medication list given to you today.  *If you need a refill on your cardiac medications before your next appointment, please call your pharmacy*   Lab Work: NONE If you have labs (blood work) drawn today and your tests are completely normal, you will receive your results only by: Marland Kitchen MyChart Message (if you have MyChart) OR . A paper copy in the mail If you have any lab test that is abnormal or we need to change your treatment, we will call you to review the results.   Testing/Procedures: NONE   Follow-Up: At [facility], you and your health needs are our priority.  As part of our continuing mission to provide you with exceptional heart care, we have created designated Provider Care Teams.  These Care Teams include your primary Cardiologist (physician) and Advanced Practice Providers (APPs -  Physician Assistants and Nurse Practitioners) who all work together to provide you with the care you need, when you need it.  We recommend signing up for the patient portal called "MyChart".  Sign up information is provided on this After Visit Summary.  MyChart is used to connect with patients for Virtual Visits (Telemedicine).  Patients are able to view lab/test results, encounter notes, upcoming appointments, etc.  Non-urgent messages can be sent to your provider as well.   To learn more about what you can do with MyChart, go to [email or web address].    Your next appointment:   3 month(s)  The format for your next appointment:   In Person  Provider:   You may see [name], MD or one of the following Advanced Practice Providers on your designated Care Team:    Melina Copa, PA-C  Ermalinda Barrios, PA-C    Other Instructions You may return to Cardiac Rehab in [place] on January 15, 2021.

## 2020-12-18 NOTE — Progress Notes (Signed)
Cardiology Office Note:    Date:  12/18/2020   ID:  Austin Oliver, DOB 06-06-1956, MRN [id number]  PCP:  [name], MD  [facility] Cardiologist:  [name], MD   CC: Follow up after PCI  History of Present Illness:    Austin Oliver is a 65 y.o. male with a hx of Sinus Bradycardia, Aortic Atherosclerosis & HLD with statin intolerance, HTN who presented 08/01/19 with unstable angina.  Shared decision making for NM Stress showed no lesion but without resolution of pain, received LCP with ostial LAD lesion s/p PCI 08/24/20.  Residual pain 08/27/20 with increase in his Imdur.  Received PPM in interval 12/04/20 SJM PPM.  Would check also scheduled for 12/18/20.  Patient notes that he is doing better since.  Since last visit notes no breathlessness changes.  Relevant interval testing or therapy include recent pacem.  There are no interval hospital/ED visit.   Notes no statin myopathy  No chest pain or pressure .  No SOB/DOE and no PND/Orthopnea.  No weight gain or leg swelling.  No palpitations or syncope .  Notes that he has to temporarily stop cardiac rehab.  His activity restrictions will be done March 1st 2022 (healing from his PPM Placement).  Past Medical History:  Diagnosis Date  . Bradycardia   . Coronary artery calcification seen on CAT scan 01/14/2017  . Essential hypertension 09/11/2020  . Family history of thyroid problem   . GERD (gastroesophageal reflux disease)   . History of kidney stones   . Hyperlipidemia   . Hypothyroidism   . Osteoarthritis   . Shingles 2012  . SOB (shortness of breath) on exertion   . Thyroid disease     Past Surgical History:  Procedure Laterality Date  . CORONARY STENT INTERVENTION N/A 08/24/2020   Procedure: CORONARY STENT INTERVENTION;  Surgeon: [name], MD;  Location: [place] [facility];  Service: Cardiovascular;  Laterality: N/A;  . HEEL SPUR SURGERY Left 80's  . INTRAVASCULAR ULTRASOUND/IVUS N/A 08/24/2020    Procedure: Intravascular Ultrasound/IVUS;  Surgeon: [name], MD;  Location: [place] [facility];  Service: Cardiovascular;  Laterality: N/A;  . KNEE SURGERY Bilateral    numerous times  . LEFT HEART CATH AND CORONARY ANGIOGRAPHY N/A 08/24/2020   Procedure: LEFT HEART CATH AND CORONARY ANGIOGRAPHY;  Surgeon: [name], MD;  Location: [place] [facility];  Service: Cardiovascular;  Laterality: N/A;  . NECK SURGERY  70's  . PACEMAKER IMPLANT N/A 12/04/2020   Procedure: PACEMAKER IMPLANT;  Surgeon: [name], MD;  Location: [place] [facility];  Service: Cardiovascular;  Laterality: N/A;  . TOTAL HIP ARTHROPLASTY Left 03/09/2017   Procedure: LEFT TOTAL HIP ARTHROPLASTY ANTERIOR APPROACH;  Surgeon: Rod Can, MD;  Location: [place];  Service: Orthopedics;  Laterality: Left;  Dr. requesting RNFA    Current Medications: No outpatient medications have been marked as taking for the 12/18/20 encounter (Office Visit) with [name], MD.     Allergies:   Bee venom, Statins, and Cleocin [clindamycin hcl]   Social History   Socioeconomic History  . Marital status: Married    Spouse name: [name]  . Number of children: 2  . Years of education: Not on file  . Highest education level: Not on file  Occupational History  . Occupation: Animator  Tobacco Use  . Smoking status: Former Smoker    Quit date: 12/05/2016    Years since quitting: 4.0  . Smokeless tobacco:  Never Used  Substance and Sexual Activity  . Alcohol use: No  . Drug use: No  . Sexual activity: Not on file  Other Topics Concern  . Not on file  Social History Narrative  . Not on file   Social Determinants of Health   Financial Resource Strain: Not on file  Food Insecurity: Not on file  Transportation Needs: Not on file  Physical Activity: Not on file  Stress: Not on file  Social Connections: Not on file     Family History: The patient's family history includes COPD in his  father; Dementia in his mother; Heart Problems in his father; Lung cancer in his father.  ROS:   Please see the history of present illness.     All other systems reviewed and are negative.  EKGs/Labs/Other Studies Reviewed:    The following studies were reviewed today:  EKG:    08/24/20 marked sinus bradycardia rate 40, no ST/T changes 07/31/20 sinus bradycardia rate of 51, with possible anterior q waves OSH 07/16/20:  Sinus bradycardia rate of 48 without ST/T changes or q waves.  Transthoracic Echocardiogram: Date: 10/02/20 Results: 1. Left ventricular ejection fraction, by estimation, is 50 to 55%. The  left ventricle has low normal function. The left ventricle has no regional  wall motion abnormalities. The left ventricular internal cavity size was  mildly dilated. Left ventricular  diastolic parameters were normal.  2. Right ventricular systolic function is normal. The right ventricular  size is normal. Tricuspid regurgitation signal is inadequate for assessing  PA pressure.  3. Left atrial size was mildly dilated.  4. The mitral valve is normal in structure. No evidence of mitral valve  regurgitation.  5. The aortic valve is tricuspid. There is mild calcification of the  aortic valve. There is mild thickening of the aortic valve. Aortic valve  regurgitation is trivial. Mild aortic valve sclerosis is present, with no  evidence of aortic valve stenosis.  6. The inferior vena cava is normal in size with greater than 50%  respiratory variability, suggesting right atrial pressure of 3 mmHg.  NonCardiac CT : Date: 08/27/2020 Results: Aortic Atherosclerosis with notable LAD disease  NM Stress Testing: Date: 08/06/20 Results: NM Stress 08/06/20  Nuclear stress EF: 57%.  There was no ST segment deviation noted during stress.  The study is normal.  This is a low risk study.  The left ventricular ejection fraction is normal (55-65%).   Normal pharmacologic nuclear  stress test with no evidence for prior infarct or ischemia. LVEF 57%. Left ventricle appears mildly dilated.  Left/Right Heart Catheterizations: Date:08/24/20 Results: Conclusions: 1. Severe single-vessel coronary artery disease with sequential 90% ostial/proximal, 40% mid, and 50% mid/distal LAD stenoses as well as 60% D3 lesion. 2. Moderate, non-obstructive coronary artery disease involving the LCx and RCA. 3. Normal left ventricular systolic function with upper normal filling pressure. 4. Successful PCI to the ostial/proximal LAD using Resolute Onyx 3.0 x 12 mm drug-eluting stent (postdilated to 3.6 mm) with 0% residual stenosis and TIMI-3 flow.  Recommendations: 1. Dual antiplatelet therapy with aspirin and ticagrelor for 12 months. 2. Aggressive secondary prevention. 3. Medical therapy of non-critical mid/distal LAD, LCx, and RCA disease. 4. Anticipate same-day discharge if no post-catheterization complications occur.   Recent Labs: 09/11/2020: NT-Pro BNP 115 11/30/2020: ALT 20; BUN 22; Creatinine, Ser 1.40; Hemoglobin 12.5; Platelets 102; Potassium 4.0; Sodium 142  Recent Lipid Panel    Component Value Date/Time   CHOL 158 08/22/2020 1044   TRIG 88  08/22/2020 1044   HDL 35 (L) 08/22/2020 1044   CHOLHDL 4.5 08/22/2020 1044   LDLCALC 106 (H) 08/22/2020 1044    Physical Exam:    VS:  BP 114/60   Pulse 64   Ht 5\' 7"  (1.702 m)   Wt 234 lb 3.2 oz (106.2 kg)   SpO2 96%   BMI 36.68 kg/m     Wt Readings from Last 3 Encounters:  12/18/20 234 lb 3.2 oz (106.2 kg)  12/04/20 235 lb (106.6 kg)  10/16/20 233 lb (105.7 kg)     GEN: Well nourished, well developed in no acute distress HEENT: Normal NECK: No JVD; no bruits LYMPHATICS: No lymphadenopathy CARDIAC: RRR, no murmurs, rubs, gallops RESPIRATORY:  Clear to auscultation without rales, wheezing or rhonchi  ABDOMEN: Soft, non-tender, non-distended MUSCULOSKELETAL:  No edema; No deformity  SKIN: Warm and dry; PPM site  c/d/i with no purulence of erythema NEUROLOGIC:  Alert and oriented x 3 PSYCHIATRIC:  Normal affect   ASSESSMENT:    1. Coronary artery disease involving native coronary artery of native heart without angina pectoris   2. Aortic atherosclerosis (Time)   3. Mixed hyperlipidemia   4. Morbid obesity (HCC)   5. Statin myopathy   6. Coronary artery calcification seen on CAT scan    PLAN:    In order of problems listed above:  Coronary Artery Disease; Obstructive - asymptomatic  - anatomy: anatomy: PCi LAD, 40% mLAD, 50% dLAD, 60% D3, < 50% RCA and LCx - continue ASA 81 mg; Continue plavix unitl 08/2020 - continue statin and zetia, goal LDL < 70 (see below) - continue nitrates; Imdur 120 - if CP free at next visit, will attempt to wean down his Ranexa 1000 mg - will assist him returning to cardiac rehab at [facility] post his PPM after his activity restrictions  Morbid Obesity with Hyperlipidemia (mixed) Prior Statin Intolerance Cirrhosis of the liver Aortic Atherosclerosis -LDL goal less than 70 - continue statin and zetia - seeing lipid clinic - gave education on dietary changes  Chronotropic incompetence  - s/p STM PPM; seen by EP ([place])  Three month follow up unless new symptoms or abnormal test results warranting change in plan  Would be reasonable for  APP Follow up   Medication Adjustments/Labs and Tests Ordered: Current medicines are reviewed at length with the patient today.  Concerns regarding medicines are outlined above.  No orders of the defined types were placed in this encounter.  No orders of the defined types were placed in this encounter.   Patient Instructions  Medication Instructions:  Your physician recommends that you continue on your current medications as directed. Please refer to the Current Medication list given to you today.  *If you need a refill on your cardiac medications before your next appointment, please call your pharmacy*   Lab  Work: NONE If you have labs (blood work) drawn today and your tests are completely normal, you will receive your results only by: Marland Kitchen MyChart Message (if you have MyChart) OR . A paper copy in the mail If you have any lab test that is abnormal or we need to change your treatment, we will call you to review the results.   Testing/Procedures: NONE   Follow-Up: At [facility], you and your health needs are our priority.  As part of our continuing mission to provide you with exceptional heart care, we have created designated Provider Care Teams.  These Care Teams include your primary Cardiologist (physician) and Advanced  Practice Providers (APPs -  Physician Assistants and Nurse Practitioners) who all work together to provide you with the care you need, when you need it.  We recommend signing up for the patient portal called "MyChart".  Sign up information is provided on this After Visit Summary.  MyChart is used to connect with patients for Virtual Visits (Telemedicine).  Patients are able to view lab/test results, encounter notes, upcoming appointments, etc.  Non-urgent messages can be sent to your provider as well.   To learn more about what you can do with MyChart, go to [email or web address].    Your next appointment:   3 month(s)  The format for your next appointment:   In Person  Provider:   You may see [name], MD or one of the following Advanced Practice Providers on your designated Care Team:    Melina Copa, PA-C  Ermalinda Barrios, PA-C    Other Instructions You may return to Cardiac Rehab in [place] on January 15, 2021.     Signed, [name], MD  12/18/2020 3:51 PM    [facility]

## 2020-12-18 NOTE — Progress Notes (Signed)
Wound check appointment. Steri-strips removed. Wound without redness or edema. Incision edges approximated, wound well healed. Normal device function. Thresholds, sensing, and impedances consistent with implant measurements. Device programmed at 3.5V/auto capture programmed on for extra safety margin until 3 month visit. Histogram distribution appropriate for patient and level of activity. No mode switches or high ventricular rates noted. Patient educated about wound care, arm mobility, lifting restrictions. ROV in 3 months with Dr. Curt Bears. Next scheduled remote 03/06/21.

## 2021-01-29 MED ORDER — CLOPIDOGREL BISULFATE 75 MG PO TABS: 75.0000 mg | ORAL_TABLET | Freq: Every day | ORAL | 3 refills | Status: DC

## 2021-03-06 ENCOUNTER — Ambulatory Visit (INDEPENDENT_AMBULATORY_CARE_PROVIDER_SITE_OTHER): Payer: Medicare Other

## 2021-03-06 ENCOUNTER — Encounter: Payer: Self-pay | Admitting: Cardiology

## 2021-03-06 ENCOUNTER — Ambulatory Visit: Payer: Medicare Other | Admitting: Cardiology

## 2021-03-06 ENCOUNTER — Other Ambulatory Visit: Payer: Self-pay

## 2021-03-06 VITALS — BP 116/72 | HR 61 | Ht 68.5 in | Wt 230.2 lb

## 2021-03-06 DIAGNOSIS — I495 Sick sinus syndrome: Secondary | ICD-10-CM

## 2021-03-06 LAB — CUP PACEART REMOTE DEVICE CHECK
Battery Remaining Longevity: 68 mo
Battery Remaining Percentage: 95.5 %
Battery Voltage: 2.99 V
Brady Statistic AP VP Percent: 1 %
Brady Statistic AP VS Percent: 91 %
Brady Statistic AS VP Percent: 1 %
Brady Statistic AS VS Percent: 8.5 %
Brady Statistic RA Percent Paced: 91 %
Brady Statistic RV Percent Paced: 1 %
Date Time Interrogation Session: 20220420020037
Implantable Lead Implant Date: 20220118
Implantable Lead Implant Date: 20220118
Implantable Lead Location: 753859
Implantable Lead Location: 753860
Implantable Pulse Generator Implant Date: 20220118
Lead Channel Impedance Value: 430 Ohm
Lead Channel Impedance Value: 540 Ohm
Lead Channel Pacing Threshold Amplitude: 0.75 V
Lead Channel Pacing Threshold Amplitude: 0.75 V
Lead Channel Pacing Threshold Pulse Width: 0.5 ms
Lead Channel Pacing Threshold Pulse Width: 0.5 ms
Lead Channel Sensing Intrinsic Amplitude: 3.8 mV
Lead Channel Sensing Intrinsic Amplitude: 7.8 mV
Lead Channel Setting Pacing Amplitude: 3.5 V
Lead Channel Setting Pacing Amplitude: 3.5 V
Lead Channel Setting Pacing Pulse Width: 0.5 ms
Lead Channel Setting Sensing Sensitivity: 2 mV
Pulse Gen Model: 2272
Pulse Gen Serial Number: 3891376

## 2021-03-06 NOTE — Progress Notes (Signed)
Electrophysiology Office Note   Date:  03/06/2021   ID:  Adon, Gehlhausen 1956/07/02, MRN [id number]  PCP:  [name], MD  Cardiologist:  [name] Primary Electrophysiologist:  Adriel Desrosier Meredith Leeds, MD    Chief Complaint: bradycardia   History of Present Illness: Austin Oliver is a 65 y.o. male who is being seen today for the evaluation of bradycardia at the request of [name], *. Presenting today for electrophysiology evaluation.  He has a history of sick sinus syndrome, aortic atherosclerosis, hyperlipidemia, statin intolerance, hypertension, coronary artery disease.  He presented to the hospital with unstable angina and was found to have an LAD lesion.  He is status post PCI 08/24/2020.  He continued to have episodes of fatigue and was found to have sick sinus syndrome.  He is now status post [place] dual-chamber pacemaker implanted 12/04/2020.  Today, denies symptoms of palpitations, chest pain, shortness of breath, orthopnea, PND, lower extremity edema, claudication, dizziness, presyncope, syncope, bleeding, or neurologic sequela. The patient is tolerating medications without difficulties.     Past Medical History:  Diagnosis Date  . Bradycardia   . Coronary artery calcification seen on CAT scan 01/14/2017  . Essential hypertension 09/11/2020  . Family history of thyroid problem   . GERD (gastroesophageal reflux disease)   . History of kidney stones   . Hyperlipidemia   . Hypothyroidism   . Osteoarthritis   . Shingles 2012  . SOB (shortness of breath) on exertion   . Thyroid disease    Past Surgical History:  Procedure Laterality Date  . CORONARY STENT INTERVENTION N/A 08/24/2020   Procedure: CORONARY STENT INTERVENTION;  Surgeon: [name], MD;  Location: [place] [facility];  Service: Cardiovascular;  Laterality: N/A;  . HEEL SPUR SURGERY Left 80's  . INTRAVASCULAR ULTRASOUND/IVUS N/A 08/24/2020   Procedure: Intravascular  Ultrasound/IVUS;  Surgeon: [name], MD;  Location: [place] [facility];  Service: Cardiovascular;  Laterality: N/A;  . KNEE SURGERY Bilateral    numerous times  . LEFT HEART CATH AND CORONARY ANGIOGRAPHY N/A 08/24/2020   Procedure: LEFT HEART CATH AND CORONARY ANGIOGRAPHY;  Surgeon: [name], MD;  Location: [place] [facility];  Service: Cardiovascular;  Laterality: N/A;  . NECK SURGERY  70's  . PACEMAKER IMPLANT N/A 12/04/2020   Procedure: PACEMAKER IMPLANT;  Surgeon: [name], MD;  Location: [place] [facility];  Service: Cardiovascular;  Laterality: N/A;  . TOTAL HIP ARTHROPLASTY Left 03/09/2017   Procedure: LEFT TOTAL HIP ARTHROPLASTY ANTERIOR APPROACH;  Surgeon: Rod Can, MD;  Location: [place];  Service: Orthopedics;  Laterality: Left;  Dr. requesting RNFA     Current Outpatient Medications  Medication Sig Dispense Refill  . acetaminophen (TYLENOL) 500 MG tablet Take 500-1,000 mg by mouth every 6 (six) hours as needed for mild pain.    Marland Kitchen aspirin 81 MG chewable tablet Chew 81 mg by mouth daily in the afternoon.    . clopidogrel (PLAVIX) 75 MG tablet Take 1 tablet (75 mg total) by mouth daily. 90 tablet 3  . ezetimibe (ZETIA) 10 MG tablet Take 10 mg by mouth daily.     Marland Kitchen gabapentin (NEURONTIN) 100 MG capsule Take 200 mg by mouth at bedtime.    Marland Kitchen ibuprofen (ADVIL) 200 MG tablet Take 400 mg by mouth every 8 (eight) hours as needed (inflammation/pain.).    Marland Kitchen isosorbide mononitrate (IMDUR) 120 MG 24 hr tablet Take 1 tablet (120 mg total) by mouth daily. 90 tablet  3  . ketotifen (ZADITOR) 0.025 % ophthalmic solution Place 1 drop into both eyes 2 (two) times daily as needed (for allergy eyes).    Marland Kitchen levothyroxine (SYNTHROID) 112 MCG tablet Take 112 mcg by mouth daily before breakfast.    . loteprednol (LOTEMAX) 0.5 % ophthalmic suspension Place 1 drop into the left eye 2 (two) times a week.    . nitroGLYCERIN (NITROSTAT) 0.4 MG SL tablet Place 1 tablet (0.4 mg total)  under the tongue every 5 (five) minutes x 3 doses as needed for chest pain. 25 tablet 4  . pantoprazole (PROTONIX) 40 MG tablet Take 40 mg by mouth daily after supper.     . psyllium (METAMUCIL) 58.6 % powder Take 1 packet by mouth 3 (three) times daily as needed (constipation).    . ranolazine (RANEXA) 1000 MG SR tablet Take 1 tablet (1,000 mg total) by mouth 2 (two) times daily. 180 tablet 3  . rosuvastatin (CRESTOR) 5 MG tablet Take 1 tablet (5 mg total) by mouth daily. 90 tablet 3  . shark liver oil-cocoa butter (PREPARATION H) 0.25-3-85.5 % suppository Place 1 suppository rectally daily as needed for hemorrhoids.    . tamsulosin (FLOMAX) 0.4 MG CAPS capsule Take 0.8 mg by mouth daily with supper.     No current facility-administered medications for this visit.    Allergies:   Bee venom, Statins, and Cleocin [clindamycin hcl]   Social History:  The patient  reports that he quit smoking about 4 years ago. He has never used smokeless tobacco. He reports that he does not drink alcohol and does not use drugs.   Family History:  The patient's family history includes COPD in his father; Dementia in his mother; Heart Problems in his father; Lung cancer in his father.   ROS:  Please see the history of present illness.   Otherwise, review of systems is positive for none.   All other systems are reviewed and negative.   PHYSICAL EXAM: VS:  BP 116/72   Pulse 61   Ht 5' 8.5" (1.74 m)   Wt 230 lb 3.2 oz (104.4 kg)   SpO2 96%   BMI 34.49 kg/m  , BMI Body mass index is 34.49 kg/m. GEN: Well nourished, well developed, in no acute distress  HEENT: normal  Neck: no JVD, carotid bruits, or masses Cardiac: RRR; no murmurs, rubs, or gallops,no edema  Respiratory:  clear to auscultation bilaterally, normal work of breathing GI: soft, nontender, nondistended, + BS MS: no deformity or atrophy  Skin: warm and dry, device site well healed Neuro:  Strength and sensation are intact Psych: euthymic mood,  full affect  EKG:  EKG is ordered today. Personal review of the ekg ordered shows sinus rhythm, rate 61  Personal review of the device interrogation today. Results in [place]: 09/11/2020: NT-Pro BNP 115 11/30/2020: ALT 20; BUN 22; Creatinine, Ser 1.40; Hemoglobin 12.5; Platelets 102; Potassium 4.0; Sodium 142    Lipid Panel     Component Value Date/Time   CHOL 158 08/22/2020 1044   TRIG 88 08/22/2020 1044   HDL 35 (L) 08/22/2020 1044   CHOLHDL 4.5 08/22/2020 1044   LDLCALC 106 (H) 08/22/2020 1044     Wt Readings from Last 3 Encounters:  03/06/21 230 lb 3.2 oz (104.4 kg)  12/18/20 234 lb 3.2 oz (106.2 kg)  12/04/20 235 lb (106.6 kg)      Other studies Reviewed: Additional studies/ records that were reviewed today include: TTE 10/02/20  Review of the above records today demonstrates:  1. Left ventricular ejection fraction, by estimation, is 50 to 55%. The  left ventricle has low normal function. The left ventricle has no regional  wall motion abnormalities. The left ventricular internal cavity size was  mildly dilated. Left ventricular  diastolic parameters were normal.  2. Right ventricular systolic function is normal. The right ventricular  size is normal. Tricuspid regurgitation signal is inadequate for assessing  PA pressure.  3. Left atrial size was mildly dilated.  4. The mitral valve is normal in structure. No evidence of mitral valve  regurgitation.  5. The aortic valve is tricuspid. There is mild calcification of the  aortic valve. There is mild thickening of the aortic valve. Aortic valve  regurgitation is trivial. Mild aortic valve sclerosis is present, with no  evidence of aortic valve stenosis.  6. The inferior vena cava is normal in size with greater than 50%  respiratory variability, suggesting right atrial pressure of 3 mmHg.   LHC 10/85/21 1. Severe single-vessel coronary artery disease with sequential 90% ostial/proximal, 40% mid,  and 50% mid/distal LAD stenoses as well as 60% D3 lesion. 2. Moderate, non-obstructive coronary artery disease involving the LCx and RCA. 3. Normal left ventricular systolic function with upper normal filling pressure. 4. Successful PCI to the ostial/proximal LAD using Resolute Onyx 3.0 x 12 mm drug-eluting stent (postdilated to 3.6 mm) with 0% residual stenosis and TIMI-3 flow.  ASSESSMENT AND PLAN:  1.  Sick sinus syndrome: Status post [employer] dual-chamber pacemaker implanted 12/04/2020.  Device functioning appropriately.  No changes at this time.    2.  Coronary artery disease: No current chest pain.  Continue with current management.    Current medicines are reviewed at length with the patient today.   The patient does not have concerns regarding his medicines.  The following changes were made today: none  Labs/ tests ordered today include:  Orders Placed This Encounter  Procedures  . EKG 12-Lead     Disposition:   FU with Star Cheese 9 months  Signed, Unique Sillas Meredith Leeds, MD  03/06/2021 2:08 PM     [facility] [address] [place] [address] [phone] (office) [phone] (fax)

## 2021-03-22 NOTE — Progress Notes (Signed)
Remote pacemaker transmission.   

## 2021-03-26 ENCOUNTER — Other Ambulatory Visit: Payer: Self-pay | Admitting: Gastroenterology

## 2021-03-26 DIAGNOSIS — K7460 Unspecified cirrhosis of liver: Secondary | ICD-10-CM

## 2021-04-04 ENCOUNTER — Encounter: Payer: Self-pay | Admitting: Internal Medicine

## 2021-04-04 ENCOUNTER — Ambulatory Visit: Payer: Medicare Other | Admitting: Internal Medicine

## 2021-04-04 ENCOUNTER — Other Ambulatory Visit: Payer: Self-pay

## 2021-04-04 VITALS — BP 112/60 | HR 60 | Ht 68.0 in | Wt 228.6 lb

## 2021-04-04 DIAGNOSIS — I2510 Atherosclerotic heart disease of native coronary artery without angina pectoris: Secondary | ICD-10-CM

## 2021-04-04 DIAGNOSIS — T466X5D Adverse effect of antihyperlipidemic and antiarteriosclerotic drugs, subsequent encounter: Secondary | ICD-10-CM

## 2021-04-04 DIAGNOSIS — G720 Drug-induced myopathy: Secondary | ICD-10-CM

## 2021-04-04 DIAGNOSIS — D519 Vitamin B12 deficiency anemia, unspecified: Secondary | ICD-10-CM

## 2021-04-04 DIAGNOSIS — I200 Unstable angina: Secondary | ICD-10-CM

## 2021-04-04 DIAGNOSIS — I700 Atherosclerosis of aorta: Secondary | ICD-10-CM

## 2021-04-04 DIAGNOSIS — R0609 Other forms of dyspnea: Secondary | ICD-10-CM | POA: Insufficient documentation

## 2021-04-04 DIAGNOSIS — R0600 Dyspnea, unspecified: Secondary | ICD-10-CM | POA: Diagnosis not present

## 2021-04-04 MED ORDER — FUROSEMIDE 20 MG PO TABS: 20.0000 mg | ORAL_TABLET | Freq: Every day | ORAL | 3 refills | Status: DC

## 2021-04-04 NOTE — H&P (View-Only) (Signed)
Cardiology Office Note:    Date:  04/04/2021   ID:  Austin Oliver, DOB August 15, 1956, MRN [id number]  PCP:  [name], MD  [facility] Cardiologist:  [name], MD   CC: Follow up after PCI  History of Present Illness:    Austin Oliver is a 65 y.o. male with a hx of Sinus Bradycardia, Aortic Atherosclerosis & HLD with statin intolerance, HTN who presented 08/01/19 with unstable angina.  Shared decision making for NM Stress showed no lesion but without resolution of pain, received LCP with ostial LAD lesion s/p PCI 08/24/20.  Residual pain 08/27/20 with increase in his Imdur.  Received PPM in interval 12/04/20 [facility] PPM.  Would check also scheduled for 12/18/20.In interim of this visit, patient CP had improved.  No angina at EP 03/06/21  Patient notes that he is doing OK.  Since last visit notes no changes.  Relevant interval testing or therapy include Vitamin D and Vitamin B12.   Has new anemia.     Last time he had a chest pain was about three weeks ago.  Still have chest pain going up the steps.  No pain during the day with activity or gardening.  Chest pain occurs at rest. Not sure the difference between X and Y days. No weight gain or leg swelling.  No palpitations or syncope. Also notes SOB and DOE.  Notes R knee pain and needs knee surgery  Past Medical History:  Diagnosis Date  . Bradycardia   . Coronary artery calcification seen on CAT scan 01/14/2017  . Essential hypertension 09/11/2020  . Family history of thyroid problem   . GERD (gastroesophageal reflux disease)   . History of kidney stones   . Hyperlipidemia   . Hypothyroidism   . Osteoarthritis   . Shingles 2012  . SOB (shortness of breath) on exertion   . Thyroid disease     Past Surgical History:  Procedure Laterality Date  . CORONARY STENT INTERVENTION N/A 08/24/2020   Procedure: CORONARY STENT INTERVENTION;  Surgeon: [name], MD;  Location: [place] [facility];  Service:  Cardiovascular;  Laterality: N/A;  . HEEL SPUR SURGERY Left 80's  . INTRAVASCULAR ULTRASOUND/IVUS N/A 08/24/2020   Procedure: Intravascular Ultrasound/IVUS;  Surgeon: [name], MD;  Location: [place] [facility];  Service: Cardiovascular;  Laterality: N/A;  . KNEE SURGERY Bilateral    numerous times  . LEFT HEART CATH AND CORONARY ANGIOGRAPHY N/A 08/24/2020   Procedure: LEFT HEART CATH AND CORONARY ANGIOGRAPHY;  Surgeon: [name], MD;  Location: [place] [facility];  Service: Cardiovascular;  Laterality: N/A;  . NECK SURGERY  70's  . PACEMAKER IMPLANT N/A 12/04/2020   Procedure: PACEMAKER IMPLANT;  Surgeon: [name], MD;  Location: [place] [facility];  Service: Cardiovascular;  Laterality: N/A;  . TOTAL HIP ARTHROPLASTY Left 03/09/2017   Procedure: LEFT TOTAL HIP ARTHROPLASTY ANTERIOR APPROACH;  Surgeon: Rod Can, MD;  Location: [place];  Service: Orthopedics;  Laterality: Left;  Dr. requesting RNFA    Current Medications: Current Meds  Medication Sig  . acetaminophen (TYLENOL) 500 MG tablet Take 500-1,000 mg by mouth every 6 (six) hours as needed for mild pain.  Marland Kitchen aspirin 81 MG chewable tablet Chew 81 mg by mouth daily in the afternoon.  . cholecalciferol (VITAMIN D3) 25 MCG (1000 UNIT) tablet Take 1,000 Units by mouth daily.  . clopidogrel (PLAVIX) 75 MG tablet Take 1 tablet (75 mg total) by mouth daily.  Marland Kitchen ezetimibe (  ZETIA) 10 MG tablet Take 10 mg by mouth daily.   . furosemide (LASIX) 20 MG tablet Take 1 tablet (20 mg total) by mouth daily.  Marland Kitchen gabapentin (NEURONTIN) 100 MG capsule Take 200 mg by mouth at bedtime.  Marland Kitchen ibuprofen (ADVIL) 200 MG tablet Take 400 mg by mouth every 8 (eight) hours as needed (inflammation/pain.).  Marland Kitchen isosorbide mononitrate (IMDUR) 120 MG 24 hr tablet Take 1 tablet (120 mg total) by mouth daily.  Marland Kitchen ketotifen (ZADITOR) 0.025 % ophthalmic solution Place 1 drop into both eyes 2 (two) times daily as needed (for allergy eyes).  Marland Kitchen  levothyroxine (SYNTHROID) 112 MCG tablet Take 112 mcg by mouth daily before breakfast.  . loteprednol (LOTEMAX) 0.5 % ophthalmic suspension Place 1 drop into the left eye 2 (two) times a week.  . nitroGLYCERIN (NITROSTAT) 0.4 MG SL tablet Place 1 tablet (0.4 mg total) under the tongue every 5 (five) minutes x 3 doses as needed for chest pain.  . pantoprazole (PROTONIX) 40 MG tablet Take 40 mg by mouth daily after supper.   . psyllium (METAMUCIL) 58.6 % powder Take 1 packet by mouth 3 (three) times daily as needed (constipation).  . ranolazine (RANEXA) 1000 MG SR tablet Take 1 tablet (1,000 mg total) by mouth 2 (two) times daily.  . rosuvastatin (CRESTOR) 5 MG tablet Take 1 tablet (5 mg total) by mouth daily.  . shark liver oil-cocoa butter (PREPARATION H) 0.25-3-85.5 % suppository Place 1 suppository rectally daily as needed for hemorrhoids.  . tamsulosin (FLOMAX) 0.4 MG CAPS capsule Take 0.8 mg by mouth daily with supper.  . vitamin B-12 (CYANOCOBALAMIN) 1000 MCG tablet Take 1,000 mcg by mouth daily.     Allergies:   Bee venom, Statins, and Cleocin [clindamycin hcl]   Social History   Socioeconomic History  . Marital status: Married    Spouse name: [name]  . Number of children: 2  . Years of education: Not on file  . Highest education level: Not on file  Occupational History  . Occupation: Animator  Tobacco Use  . Smoking status: Former Smoker    Quit date: 12/05/2016    Years since quitting: 4.3  . Smokeless tobacco: Never Used  Substance and Sexual Activity  . Alcohol use: No  . Drug use: No  . Sexual activity: Not on file  Other Topics Concern  . Not on file  Social History Narrative  . Not on file   Social Determinants of Health   Financial Resource Strain: Not on file  Food Insecurity: Not on file  Transportation Needs: Not on file  Physical Activity: Not on file  Stress: Not on file  Social Connections: Not on file    Social: Comes up with his  wife  Family History: The patient's family history includes COPD in his father; Dementia in his mother; Heart Problems in his father; Lung cancer in his father.  ROS:   Please see the history of present illness.     All other systems reviewed and are negative.  EKGs/Labs/Other Studies Reviewed:    The following studies were reviewed today:  EKG:   04/04/21: Sr rate 62 1st HB Pr 202 08/24/20 marked sinus bradycardia rate 40, no ST/T changes 07/31/20 sinus bradycardia rate of 51, with possible anterior q waves OSH 07/16/20:  Sinus bradycardia rate of 48 without ST/T changes or q waves.  Transthoracic Echocardiogram: Date: 10/02/20 Results: 1. Left ventricular ejection fraction, by estimation, is 50 to 55%. The  left ventricle  has low normal function. The left ventricle has no regional  wall motion abnormalities. The left ventricular internal cavity size was  mildly dilated. Left ventricular  diastolic parameters were normal.  2. Right ventricular systolic function is normal. The right ventricular  size is normal. Tricuspid regurgitation signal is inadequate for assessing  PA pressure.  3. Left atrial size was mildly dilated.  4. The mitral valve is normal in structure. No evidence of mitral valve  regurgitation.  5. The aortic valve is tricuspid. There is mild calcification of the  aortic valve. There is mild thickening of the aortic valve. Aortic valve  regurgitation is trivial. Mild aortic valve sclerosis is present, with no  evidence of aortic valve stenosis.  6. The inferior vena cava is normal in size with greater than 50%  respiratory variability, suggesting right atrial pressure of 3 mmHg.  NonCardiac CT : Date: 08/27/2020 Results: Aortic Atherosclerosis with notable LAD disease  NM Stress Testing: Date: 08/06/20 Results: NM Stress 08/06/20  Nuclear stress EF: 57%.  There was no ST segment deviation noted during stress.  The study is normal.  This is a low  risk study.  The left ventricular ejection fraction is normal (55-65%).   Normal pharmacologic nuclear stress test with no evidence for prior infarct or ischemia. LVEF 57%. Left ventricle appears mildly dilated.  Left/Right Heart Catheterizations: Date:08/24/20 Results: Conclusions: 1. Severe single-vessel coronary artery disease with sequential 90% ostial/proximal, 40% mid, and 50% mid/distal LAD stenoses as well as 60% D3 lesion. 2. Moderate, non-obstructive coronary artery disease involving the LCx and RCA. 3. Normal left ventricular systolic function with upper normal filling pressure. 4. Successful PCI to the ostial/proximal LAD using Resolute Onyx 3.0 x 12 mm drug-eluting stent (postdilated to 3.6 mm) with 0% residual stenosis and TIMI-3 flow.  Recommendations: 1. Dual antiplatelet therapy with aspirin and ticagrelor for 12 months. 2. Aggressive secondary prevention. 3. Medical therapy of non-critical mid/distal LAD, LCx, and RCA disease. 4. Anticipate same-day discharge if no post-catheterization complications occur.   Recent Labs: 09/11/2020: NT-Pro BNP 115 11/30/2020: ALT 20; BUN 22; Creatinine, Ser 1.40; Hemoglobin 12.5; Platelets 102; Potassium 4.0; Sodium 142  Recent Lipid Panel    Component Value Date/Time   CHOL 158 08/22/2020 1044   TRIG 88 08/22/2020 1044   HDL 35 (L) 08/22/2020 1044   CHOLHDL 4.5 08/22/2020 1044   LDLCALC 106 (H) 08/22/2020 1044    Physical Exam:    VS:  BP 112/60   Pulse 60   Ht 5\' 8"  (1.727 m)   Wt 103.7 kg   SpO2 97%   BMI 34.76 kg/m     Wt Readings from Last 3 Encounters:  04/04/21 103.7 kg  03/06/21 104.4 kg  12/18/20 106.2 kg    GEN: Well nourished, well developed in no acute distress HEENT: Normal NECK: No JVD; no bruits LYMPHATICS: No lymphadenopathy CARDIAC: RRR, no murmurs, rubs, gallops RESPIRATORY:  Clear to auscultation without rales, wheezing or rhonchi  ABDOMEN: Soft, non-tender, non-distended MUSCULOSKELETAL:   +1 edema bilateral; No deformity  SKIN: Warm and dry; PPM site c/d/i with no purulence of erythema NEUROLOGIC:  Alert and oriented x 3 PSYCHIATRIC:  Normal affect   ASSESSMENT:    1. DOE (dyspnea on exertion)   2. Anemia due to vitamin B12 deficiency, unspecified B12 deficiency type   3. Morbid obesity ([place])   4. Coronary artery disease involving native coronary artery of native heart without angina pectoris   5. Statin myopathy   6.  Aortic atherosclerosis ([place])   7. Unstable angina ([facility])    PLAN:    In order of problems listed above:  Coronary Artery Disease; Obstructive with stable angina. COPD DOE Morbid Obesity with Hyperlipidemia (mixed) Prior Statin Intolerance- tolerating current rosuvastatin Cirrhosis of the liver Aortic Atherosclerosis Chronotropic incompetence Mixed macrocytic anemia-> Hgb up to 12.2 - asymptomatic  - anatomy: anatomy: PCi LAD, 40% mLAD, 50% dLAD, 60% D3, < 50% RCA and LCx - continue ASA 81 mg; Continue plavix unitl 08/2020 - continue statin and zetia - continue nitrates; Imdur 120 PO Daily -  Ranexa 1000 mg - concerned that patient my not tolerate BB; heart rate at 60 and s/p PPM - seen by lipid clinic in the past - starting low dose lasix 20 mg PO Daily and get BNP - will get BMP/MG/CBC in two weeks - reaching out to interventional teammates for viability of further targets; if stable CBC will plan for Repeat Cath - getting EKG today - if cardiac work up unremarkable and still has DOE; will get PFTs - seeing EP for PPM ([facility])  4-6 week follow up.  - Risks and benefits of cardiac catheterization have been discussed with the patient.  These include bleeding, infection, kidney damage, stroke, heart attack, death.  The patient understands these risks and is willing to proceed if needed as above.  Time Spent Directly with Patient:   I have spent a total of 40 minutes with the patient reviewing notes, imaging, EKGs, labs and examining the  patient as well as establishing an assessment and plan that was discussed personally with the patient.  > 50% of time was spent in direct patient care and family.   Medication Adjustments/Labs and Tests Ordered: Current medicines are reviewed at length with the patient today.  Concerns regarding medicines are outlined above.  Orders Placed This Encounter  Procedures  . Pro b natriuretic peptide (BNP)  . Basic metabolic panel  . Magnesium  . CBC  . EKG 12-Lead   Meds ordered this encounter  Medications  . furosemide (LASIX) 20 MG tablet    Sig: Take 1 tablet (20 mg total) by mouth daily.    Dispense:  90 tablet    Refill:  3    Patient Instructions  Medication Instructions:  Your physician has recommended you make the following change in your medication:  START: furosemide (Lasix) 20 mg by mouth daily   *If you need a refill on your cardiac medications before your next appointment, please call your pharmacy*   Lab Work: TODAY: BNP IN 2 WEEKS: BMP, MG, CBC If you have labs (blood work) drawn today and your tests are completely normal, you will receive your results only by: Marland Kitchen MyChart Message (if you have MyChart) OR . A paper copy in the mail If you have any lab test that is abnormal or we need to change your treatment, we will call you to review the results.   Testing/Procedures: NONE   Follow-Up: At [facility], you and your health needs are our priority.  As part of our continuing mission to provide you with exceptional heart care, we have created designated Provider Care Teams.  These Care Teams include your primary Cardiologist (physician) and Advanced Practice Providers (APPs -  Physician Assistants and Nurse Practitioners) who all work together to provide you with the care you need, when you need it.  We recommend signing up for the patient portal called "MyChart".  Sign up information is provided on this  After Visit Summary.  MyChart is used to connect with  patients for Virtual Visits (Telemedicine).  Patients are able to view lab/test results, encounter notes, upcoming appointments, etc.  Non-urgent messages can be sent to your provider as well.   To learn more about what you can do with MyChart, go to [email or web address].    Your next appointment:   4-6 week(s)  The format for your next appointment:   In Person  Provider:   You may see [name], MD or one of the following Advanced Practice Providers on your designated Care Team:    Melina Copa, PA-C  Ermalinda Barrios, PA-C          Signed, [name], MD  04/04/2021 2:31 PM    [place]

## 2021-04-04 NOTE — Addendum Note (Signed)
Addended by: Precious Gilding on: 04/04/2021 02:49 PM   Modules accepted: Orders

## 2021-04-04 NOTE — Progress Notes (Signed)
Cardiology Office Note:    Date:  04/04/2021   ID:  Austin Oliver, DOB August 15, 1956, MRN [id number]  PCP:  [name], MD  [facility] Cardiologist:  [name], MD   CC: Follow up after PCI  History of Present Illness:    Austin Oliver is a 65 y.o. male with a hx of Sinus Bradycardia, Aortic Atherosclerosis & HLD with statin intolerance, HTN who presented 08/01/19 with unstable angina.  Shared decision making for NM Stress showed no lesion but without resolution of pain, received LCP with ostial LAD lesion s/p PCI 08/24/20.  Residual pain 08/27/20 with increase in his Imdur.  Received PPM in interval 12/04/20 [facility] PPM.  Would check also scheduled for 12/18/20.In interim of this visit, patient CP had improved.  No angina at EP 03/06/21  Patient notes that he is doing OK.  Since last visit notes no changes.  Relevant interval testing or therapy include Vitamin D and Vitamin B12.   Has new anemia.     Last time he had a chest pain was about three weeks ago.  Still have chest pain going up the steps.  No pain during the day with activity or gardening.  Chest pain occurs at rest. Not sure the difference between X and Y days. No weight gain or leg swelling.  No palpitations or syncope. Also notes SOB and DOE.  Notes R knee pain and needs knee surgery  Past Medical History:  Diagnosis Date  . Bradycardia   . Coronary artery calcification seen on CAT scan 01/14/2017  . Essential hypertension 09/11/2020  . Family history of thyroid problem   . GERD (gastroesophageal reflux disease)   . History of kidney stones   . Hyperlipidemia   . Hypothyroidism   . Osteoarthritis   . Shingles 2012  . SOB (shortness of breath) on exertion   . Thyroid disease     Past Surgical History:  Procedure Laterality Date  . CORONARY STENT INTERVENTION N/A 08/24/2020   Procedure: CORONARY STENT INTERVENTION;  Surgeon: [name], MD;  Location: [place] [facility];  Service:  Cardiovascular;  Laterality: N/A;  . HEEL SPUR SURGERY Left 80's  . INTRAVASCULAR ULTRASOUND/IVUS N/A 08/24/2020   Procedure: Intravascular Ultrasound/IVUS;  Surgeon: [name], MD;  Location: [place] [facility];  Service: Cardiovascular;  Laterality: N/A;  . KNEE SURGERY Bilateral    numerous times  . LEFT HEART CATH AND CORONARY ANGIOGRAPHY N/A 08/24/2020   Procedure: LEFT HEART CATH AND CORONARY ANGIOGRAPHY;  Surgeon: [name], MD;  Location: [place] [facility];  Service: Cardiovascular;  Laterality: N/A;  . NECK SURGERY  70's  . PACEMAKER IMPLANT N/A 12/04/2020   Procedure: PACEMAKER IMPLANT;  Surgeon: [name], MD;  Location: [place] [facility];  Service: Cardiovascular;  Laterality: N/A;  . TOTAL HIP ARTHROPLASTY Left 03/09/2017   Procedure: LEFT TOTAL HIP ARTHROPLASTY ANTERIOR APPROACH;  Surgeon: Rod Can, MD;  Location: [place];  Service: Orthopedics;  Laterality: Left;  Dr. requesting RNFA    Current Medications: Current Meds  Medication Sig  . acetaminophen (TYLENOL) 500 MG tablet Take 500-1,000 mg by mouth every 6 (six) hours as needed for mild pain.  Marland Kitchen aspirin 81 MG chewable tablet Chew 81 mg by mouth daily in the afternoon.  . cholecalciferol (VITAMIN D3) 25 MCG (1000 UNIT) tablet Take 1,000 Units by mouth daily.  . clopidogrel (PLAVIX) 75 MG tablet Take 1 tablet (75 mg total) by mouth daily.  Marland Kitchen ezetimibe (  ZETIA) 10 MG tablet Take 10 mg by mouth daily.   . furosemide (LASIX) 20 MG tablet Take 1 tablet (20 mg total) by mouth daily.  Marland Kitchen gabapentin (NEURONTIN) 100 MG capsule Take 200 mg by mouth at bedtime.  Marland Kitchen ibuprofen (ADVIL) 200 MG tablet Take 400 mg by mouth every 8 (eight) hours as needed (inflammation/pain.).  Marland Kitchen isosorbide mononitrate (IMDUR) 120 MG 24 hr tablet Take 1 tablet (120 mg total) by mouth daily.  Marland Kitchen ketotifen (ZADITOR) 0.025 % ophthalmic solution Place 1 drop into both eyes 2 (two) times daily as needed (for allergy eyes).  Marland Kitchen  levothyroxine (SYNTHROID) 112 MCG tablet Take 112 mcg by mouth daily before breakfast.  . loteprednol (LOTEMAX) 0.5 % ophthalmic suspension Place 1 drop into the left eye 2 (two) times a week.  . nitroGLYCERIN (NITROSTAT) 0.4 MG SL tablet Place 1 tablet (0.4 mg total) under the tongue every 5 (five) minutes x 3 doses as needed for chest pain.  . pantoprazole (PROTONIX) 40 MG tablet Take 40 mg by mouth daily after supper.   . psyllium (METAMUCIL) 58.6 % powder Take 1 packet by mouth 3 (three) times daily as needed (constipation).  . ranolazine (RANEXA) 1000 MG SR tablet Take 1 tablet (1,000 mg total) by mouth 2 (two) times daily.  . rosuvastatin (CRESTOR) 5 MG tablet Take 1 tablet (5 mg total) by mouth daily.  . shark liver oil-cocoa butter (PREPARATION H) 0.25-3-85.5 % suppository Place 1 suppository rectally daily as needed for hemorrhoids.  . tamsulosin (FLOMAX) 0.4 MG CAPS capsule Take 0.8 mg by mouth daily with supper.  . vitamin B-12 (CYANOCOBALAMIN) 1000 MCG tablet Take 1,000 mcg by mouth daily.     Allergies:   Bee venom, Statins, and Cleocin [clindamycin hcl]   Social History   Socioeconomic History  . Marital status: Married    Spouse name: [name]  . Number of children: 2  . Years of education: Not on file  . Highest education level: Not on file  Occupational History  . Occupation: Animator  Tobacco Use  . Smoking status: Former Smoker    Quit date: 12/05/2016    Years since quitting: 4.3  . Smokeless tobacco: Never Used  Substance and Sexual Activity  . Alcohol use: No  . Drug use: No  . Sexual activity: Not on file  Other Topics Concern  . Not on file  Social History Narrative  . Not on file   Social Determinants of Health   Financial Resource Strain: Not on file  Food Insecurity: Not on file  Transportation Needs: Not on file  Physical Activity: Not on file  Stress: Not on file  Social Connections: Not on file    Social: Comes up with his  wife  Family History: The patient's family history includes COPD in his father; Dementia in his mother; Heart Problems in his father; Lung cancer in his father.  ROS:   Please see the history of present illness.     All other systems reviewed and are negative.  EKGs/Labs/Other Studies Reviewed:    The following studies were reviewed today:  EKG:   04/04/21: Sr rate 62 1st HB Pr 202 08/24/20 marked sinus bradycardia rate 40, no ST/T changes 07/31/20 sinus bradycardia rate of 51, with possible anterior q waves OSH 07/16/20:  Sinus bradycardia rate of 48 without ST/T changes or q waves.  Transthoracic Echocardiogram: Date: 10/02/20 Results: 1. Left ventricular ejection fraction, by estimation, is 50 to 55%. The  left ventricle  has low normal function. The left ventricle has no regional  wall motion abnormalities. The left ventricular internal cavity size was  mildly dilated. Left ventricular  diastolic parameters were normal.  2. Right ventricular systolic function is normal. The right ventricular  size is normal. Tricuspid regurgitation signal is inadequate for assessing  PA pressure.  3. Left atrial size was mildly dilated.  4. The mitral valve is normal in structure. No evidence of mitral valve  regurgitation.  5. The aortic valve is tricuspid. There is mild calcification of the  aortic valve. There is mild thickening of the aortic valve. Aortic valve  regurgitation is trivial. Mild aortic valve sclerosis is present, with no  evidence of aortic valve stenosis.  6. The inferior vena cava is normal in size with greater than 50%  respiratory variability, suggesting right atrial pressure of 3 mmHg.  NonCardiac CT : Date: 08/27/2020 Results: Aortic Atherosclerosis with notable LAD disease  NM Stress Testing: Date: 08/06/20 Results: NM Stress 08/06/20  Nuclear stress EF: 57%.  There was no ST segment deviation noted during stress.  The study is normal.  This is a low  risk study.  The left ventricular ejection fraction is normal (55-65%).   Normal pharmacologic nuclear stress test with no evidence for prior infarct or ischemia. LVEF 57%. Left ventricle appears mildly dilated.  Left/Right Heart Catheterizations: Date:08/24/20 Results: Conclusions: 1. Severe single-vessel coronary artery disease with sequential 90% ostial/proximal, 40% mid, and 50% mid/distal LAD stenoses as well as 60% D3 lesion. 2. Moderate, non-obstructive coronary artery disease involving the LCx and RCA. 3. Normal left ventricular systolic function with upper normal filling pressure. 4. Successful PCI to the ostial/proximal LAD using Resolute Onyx 3.0 x 12 mm drug-eluting stent (postdilated to 3.6 mm) with 0% residual stenosis and TIMI-3 flow.  Recommendations: 1. Dual antiplatelet therapy with aspirin and ticagrelor for 12 months. 2. Aggressive secondary prevention. 3. Medical therapy of non-critical mid/distal LAD, LCx, and RCA disease. 4. Anticipate same-day discharge if no post-catheterization complications occur.   Recent Labs: 09/11/2020: NT-Pro BNP 115 11/30/2020: ALT 20; BUN 22; Creatinine, Ser 1.40; Hemoglobin 12.5; Platelets 102; Potassium 4.0; Sodium 142  Recent Lipid Panel    Component Value Date/Time   CHOL 158 08/22/2020 1044   TRIG 88 08/22/2020 1044   HDL 35 (L) 08/22/2020 1044   CHOLHDL 4.5 08/22/2020 1044   LDLCALC 106 (H) 08/22/2020 1044    Physical Exam:    VS:  BP 112/60   Pulse 60   Ht 5\' 8"  (1.727 m)   Wt 103.7 kg   SpO2 97%   BMI 34.76 kg/m     Wt Readings from Last 3 Encounters:  04/04/21 103.7 kg  03/06/21 104.4 kg  12/18/20 106.2 kg    GEN: Well nourished, well developed in no acute distress HEENT: Normal NECK: No JVD; no bruits LYMPHATICS: No lymphadenopathy CARDIAC: RRR, no murmurs, rubs, gallops RESPIRATORY:  Clear to auscultation without rales, wheezing or rhonchi  ABDOMEN: Soft, non-tender, non-distended MUSCULOSKELETAL:   +1 edema bilateral; No deformity  SKIN: Warm and dry; PPM site c/d/i with no purulence of erythema NEUROLOGIC:  Alert and oriented x 3 PSYCHIATRIC:  Normal affect   ASSESSMENT:    1. DOE (dyspnea on exertion)   2. Anemia due to vitamin B12 deficiency, unspecified B12 deficiency type   3. Morbid obesity ([place])   4. Coronary artery disease involving native coronary artery of native heart without angina pectoris   5. Statin myopathy   6.  Aortic atherosclerosis ([place])   7. Unstable angina (H[facility])    PLAN:    In order of problems listed above:  Coronary Artery Disease; Obstructive with stable angina. COPD DOE Morbid Obesity with Hyperlipidemia (mixed) Prior Statin Intolerance- tolerating current rosuvastatin Cirrhosis of the liver Aortic Atherosclerosis Chronotropic incompetence Mixed macrocytic anemia-> Hgb up to 12.2 - asymptomatic  - anatomy: anatomy: PCi LAD, 40% mLAD, 50% dLAD, 60% D3, < 50% RCA and LCx - continue ASA 81 mg; Continue plavix unitl 08/2020 - continue statin and zetia - continue nitrates; Imdur 120 PO Daily -  Ranexa 1000 mg - concerned that patient my not tolerate BB; heart rate at 60 and s/p PPM - seen by lipid clinic in the past - starting low dose lasix 20 mg PO Daily and get BNP - will get BMP/MG/CBC in two weeks - reaching out to interventional teammates for viability of further targets; if stable CBC will plan for Repeat Cath - getting EKG today - if cardiac work up unremarkable and still has DOE; will get PFTs - seeing EP for PPM ([facility])  4-6 week follow up.  - Risks and benefits of cardiac catheterization have been discussed with the patient.  These include bleeding, infection, kidney damage, stroke, heart attack, death.  The patient understands these risks and is willing to proceed if needed as above.  Time Spent Directly with Patient:   I have spent a total of 40 minutes with the patient reviewing notes, imaging, EKGs, labs and examining the  patient as well as establishing an assessment and plan that was discussed personally with the patient.  > 50% of time was spent in direct patient care and family.   Medication Adjustments/Labs and Tests Ordered: Current medicines are reviewed at length with the patient today.  Concerns regarding medicines are outlined above.  Orders Placed This Encounter  Procedures  . Pro b natriuretic peptide (BNP)  . Basic metabolic panel  . Magnesium  . CBC  . EKG 12-Lead   Meds ordered this encounter  Medications  . furosemide (LASIX) 20 MG tablet    Sig: Take 1 tablet (20 mg total) by mouth daily.    Dispense:  90 tablet    Refill:  3    Patient Instructions  Medication Instructions:  Your physician has recommended you make the following change in your medication:  START: furosemide (Lasix) 20 mg by mouth daily   *If you need a refill on your cardiac medications before your next appointment, please call your pharmacy*   Lab Work: TODAY: BNP IN 2 WEEKS: BMP, MG, CBC If you have labs (blood work) drawn today and your tests are completely normal, you will receive your results only by: Marland Kitchen MyChart Message (if you have MyChart) OR . A paper copy in the mail If you have any lab test that is abnormal or we need to change your treatment, we will call you to review the results.   Testing/Procedures: NONE   Follow-Up: At [facility], you and your health needs are our priority.  As part of our continuing mission to provide you with exceptional heart care, we have created designated Provider Care Teams.  These Care Teams include your primary Cardiologist (physician) and Advanced Practice Providers (APPs -  Physician Assistants and Nurse Practitioners) who all work together to provide you with the care you need, when you need it.  We recommend signing up for the patient portal called "MyChart".  Sign up information is provided on this  After Visit Summary.  MyChart is used to connect with  patients for Virtual Visits (Telemedicine).  Patients are able to view lab/test results, encounter notes, upcoming appointments, etc.  Non-urgent messages can be sent to your provider as well.   To learn more about what you can do with MyChart, go to [email or web address].    Your next appointment:   4-6 week(s)  The format for your next appointment:   In Person  Provider:   You may see [name], MD or one of the following Advanced Practice Providers on your designated Care Team:    Melina Copa, PA-C  Ermalinda Barrios, PA-C          Signed, [name], MD  04/04/2021 2:31 PM    [place]

## 2021-04-04 NOTE — Patient Instructions (Signed)
Medication Instructions:  Your physician has recommended you make the following change in your medication:  START: furosemide (Lasix) 20 mg by mouth daily   *If you need a refill on your cardiac medications before your next appointment, please call your pharmacy*   Lab Work: TODAY: BNP IN 2 WEEKS: BMP, MG, CBC If you have labs (blood work) drawn today and your tests are completely normal, you will receive your results only by: Marland Kitchen MyChart Message (if you have MyChart) OR . A paper copy in the mail If you have any lab test that is abnormal or we need to change your treatment, we will call you to review the results.   Testing/Procedures: NONE   Follow-Up: At [facility], you and your health needs are our priority.  As part of our continuing mission to provide you with exceptional heart care, we have created designated Provider Care Teams.  These Care Teams include your primary Cardiologist (physician) and Advanced Practice Providers (APPs -  Physician Assistants and Nurse Practitioners) who all work together to provide you with the care you need, when you need it.  We recommend signing up for the patient portal called "MyChart".  Sign up information is provided on this After Visit Summary.  MyChart is used to connect with patients for Virtual Visits (Telemedicine).  Patients are able to view lab/test results, encounter notes, upcoming appointments, etc.  Non-urgent messages can be sent to your provider as well.   To learn more about what you can do with MyChart, go to [email or web address].    Your next appointment:   4-6 week(s)  The format for your next appointment:   In Person  Provider:   You may see [name], MD or one of the following Advanced Practice Providers on your designated Care Team:    Melina Copa, PA-C  Ermalinda Barrios, PA-C

## 2021-04-05 LAB — PRO B NATRIURETIC PEPTIDE: NT-Pro BNP: 46 pg/mL (ref 0–376)

## 2021-04-16 ENCOUNTER — Other Ambulatory Visit: Payer: Medicare Other | Admitting: *Deleted

## 2021-04-16 ENCOUNTER — Other Ambulatory Visit: Payer: Self-pay

## 2021-04-16 DIAGNOSIS — D519 Vitamin B12 deficiency anemia, unspecified: Secondary | ICD-10-CM

## 2021-04-16 DIAGNOSIS — R0600 Dyspnea, unspecified: Secondary | ICD-10-CM

## 2021-04-16 DIAGNOSIS — R0609 Other forms of dyspnea: Secondary | ICD-10-CM

## 2021-04-16 LAB — CBC
Hematocrit: 37.5 % (ref 37.5–51.0)
Hemoglobin: 12.7 g/dL — ABNORMAL LOW (ref 13.0–17.7)
MCH: 31.9 pg (ref 26.6–33.0)
MCHC: 33.9 g/dL (ref 31.5–35.7)
MCV: 94 fL (ref 79–97)
Platelets: 120 10*3/uL — ABNORMAL LOW (ref 150–450)
RBC: 3.98 x10E6/uL — ABNORMAL LOW (ref 4.14–5.80)
RDW: 12.5 % (ref 11.6–15.4)
WBC: 3.4 10*3/uL (ref 3.4–10.8)

## 2021-04-16 LAB — BASIC METABOLIC PANEL
BUN/Creatinine Ratio: 16 (ref 10–24)
BUN: 22 mg/dL (ref 8–27)
CO2: 24 mmol/L (ref 20–29)
Calcium: 9.6 mg/dL (ref 8.6–10.2)
Chloride: 101 mmol/L (ref 96–106)
Creatinine, Ser: 1.34 mg/dL — ABNORMAL HIGH (ref 0.76–1.27)
Glucose: 138 mg/dL — ABNORMAL HIGH (ref 65–99)
Potassium: 4.6 mmol/L (ref 3.5–5.2)
Sodium: 139 mmol/L (ref 134–144)
eGFR: 58 mL/min/{1.73_m2} — ABNORMAL LOW (ref >59)

## 2021-04-16 LAB — MAGNESIUM: Magnesium: 1.9 mg/dL (ref 1.6–2.3)

## 2021-04-17 NOTE — Progress Notes (Signed)
Called patient notified him that heart cath instructions have been sent via my chart.  I verbally reviewed instructions with pt.  He verbalizes understanding.

## 2021-04-19 ENCOUNTER — Other Ambulatory Visit: Payer: Medicare Other

## 2021-04-21 NOTE — Addendum Note (Signed)
Addended by: [name] A on: 04/21/2021 10:22 AM   Modules accepted: Orders, SmartSet

## 2021-04-22 ENCOUNTER — Ambulatory Visit
Admission: RE | Admit: 2021-04-22 | Discharge: 2021-04-22 | Disposition: A | Discharge status: Home / Self Care | Payer: Medicare Other | Source: Ambulatory Visit | Attending: Gastroenterology | Admitting: Gastroenterology

## 2021-04-22 DIAGNOSIS — K7460 Unspecified cirrhosis of liver: Secondary | ICD-10-CM

## 2021-04-24 ENCOUNTER — Telehealth: Payer: Self-pay | Admitting: *Deleted

## 2021-04-24 NOTE — Telephone Encounter (Signed)
Pt contacted pre-catheterization scheduled at [facility] for: Thursday April 25, 2021 10:30 AM Verified arrival time and place: [facility]) at: 8:30 AM   No solid food after midnight prior to cath, clear liquids until 5 AM day of procedure.   Hold: Lasix-day before and day of procedure-GFR 58-pt already taken today Ibuprofen-until post procedure-GFR 58  Except hold medications AM meds can be  taken pre-cath with sips of water including: ASA 81 mg Plavix 75 mg  Confirmed patient has responsible adult to drive home post procedure and be with patient first 24 hours after arriving home: yes  You are allowed ONE visitor in the waiting room during the time you are at the hospital for your procedure. Both you and your visitor must wear a mask once you enter the hospital.   Patient reports does not currently have any symptoms concerning for COVID-19 and no household members with COVID-19 like illness.      Reviewed procedure/mask/visitor instructions with patient.

## 2021-04-25 ENCOUNTER — Other Ambulatory Visit: Payer: Self-pay

## 2021-04-25 ENCOUNTER — Encounter (HOSPITAL_COMMUNITY)
Admission: RE | Disposition: A | Discharge status: Home / Self Care | Payer: Self-pay | Source: Home / Self Care | Attending: Internal Medicine

## 2021-04-25 ENCOUNTER — Ambulatory Visit (HOSPITAL_COMMUNITY)
Admission: RE | Admit: 2021-04-25 | Discharge: 2021-04-25 | Disposition: A | Discharge status: Home / Self Care | Payer: Medicare Other | Attending: Internal Medicine | Admitting: Internal Medicine

## 2021-04-25 DIAGNOSIS — J449 Chronic obstructive pulmonary disease, unspecified: Secondary | ICD-10-CM | POA: Insufficient documentation

## 2021-04-25 DIAGNOSIS — Z6834 Body mass index (BMI) 34.0-34.9, adult: Secondary | ICD-10-CM | POA: Insufficient documentation

## 2021-04-25 DIAGNOSIS — I25118 Atherosclerotic heart disease of native coronary artery with other forms of angina pectoris: Secondary | ICD-10-CM | POA: Diagnosis present

## 2021-04-25 DIAGNOSIS — E782 Mixed hyperlipidemia: Secondary | ICD-10-CM | POA: Insufficient documentation

## 2021-04-25 DIAGNOSIS — I700 Atherosclerosis of aorta: Secondary | ICD-10-CM | POA: Insufficient documentation

## 2021-04-25 DIAGNOSIS — Z955 Presence of coronary angioplasty implant and graft: Secondary | ICD-10-CM | POA: Insufficient documentation

## 2021-04-25 DIAGNOSIS — I495 Sick sinus syndrome: Secondary | ICD-10-CM | POA: Diagnosis not present

## 2021-04-25 DIAGNOSIS — Z950 Presence of cardiac pacemaker: Secondary | ICD-10-CM | POA: Diagnosis not present

## 2021-04-25 DIAGNOSIS — D519 Vitamin B12 deficiency anemia, unspecified: Secondary | ICD-10-CM | POA: Diagnosis not present

## 2021-04-25 DIAGNOSIS — E039 Hypothyroidism, unspecified: Secondary | ICD-10-CM | POA: Diagnosis not present

## 2021-04-25 DIAGNOSIS — Z79899 Other long term (current) drug therapy: Secondary | ICD-10-CM | POA: Insufficient documentation

## 2021-04-25 DIAGNOSIS — Z7902 Long term (current) use of antithrombotics/antiplatelets: Secondary | ICD-10-CM | POA: Diagnosis not present

## 2021-04-25 DIAGNOSIS — K7460 Unspecified cirrhosis of liver: Secondary | ICD-10-CM | POA: Insufficient documentation

## 2021-04-25 DIAGNOSIS — Z7982 Long term (current) use of aspirin: Secondary | ICD-10-CM | POA: Insufficient documentation

## 2021-04-25 DIAGNOSIS — I10 Essential (primary) hypertension: Secondary | ICD-10-CM | POA: Insufficient documentation

## 2021-04-25 DIAGNOSIS — R0609 Other forms of dyspnea: Secondary | ICD-10-CM | POA: Diagnosis not present

## 2021-04-25 HISTORY — PX: INTRAVASCULAR PRESSURE WIRE/FFR STUDY: CATH118243

## 2021-04-25 HISTORY — PX: LEFT HEART CATH AND CORONARY ANGIOGRAPHY: CATH118249

## 2021-04-25 LAB — POCT ACTIVATED CLOTTING TIME
Activated Clotting Time: 248 seconds
Activated Clotting Time: 254 seconds

## 2021-04-25 SURGERY — LEFT HEART CATH AND CORONARY ANGIOGRAPHY
Anesthesia: LOCAL

## 2021-04-25 MED ORDER — VERAPAMIL HCL 2.5 MG/ML IV SOLN
INTRAVENOUS | Status: AC
  Filled 2021-04-25: qty 2

## 2021-04-25 MED ORDER — ONDANSETRON HCL 4 MG/2ML IJ SOLN
4.0000 mg | Freq: Four times a day (QID) | INTRAMUSCULAR | Status: DC | PRN
Start: 2021-04-25 — End: 2021-04-25

## 2021-04-25 MED ORDER — HEPARIN SODIUM (PORCINE) 1000 UNIT/ML IJ SOLN
INTRAMUSCULAR | Status: AC
  Filled 2021-04-25: qty 1

## 2021-04-25 MED ORDER — HEPARIN (PORCINE) IN NACL 1000-0.9 UT/500ML-% IV SOLN
INTRAVENOUS | Status: AC
  Filled 2021-04-25: qty 1000

## 2021-04-25 MED ORDER — VERAPAMIL HCL 2.5 MG/ML IV SOLN
INTRAVENOUS | Status: DC | PRN
  Administered 2021-04-25: 10 mL via INTRA_ARTERIAL

## 2021-04-25 MED ORDER — SODIUM CHLORIDE 0.9% FLUSH: 3.0000 mL | Freq: Two times a day (BID) | INTRAVENOUS | Status: DC

## 2021-04-25 MED ORDER — SODIUM CHLORIDE 0.9 % IV SOLN: 250.0000 mL | INTRAVENOUS | Status: DC | PRN

## 2021-04-25 MED ORDER — LIDOCAINE HCL (PF) 1 % IJ SOLN
INTRAMUSCULAR | Status: DC | PRN
  Administered 2021-04-25: 2 mL

## 2021-04-25 MED ORDER — SODIUM CHLORIDE 0.9% FLUSH: 3.0000 mL | INTRAVENOUS | Status: DC | PRN

## 2021-04-25 MED ORDER — MIDAZOLAM HCL 2 MG/2ML IJ SOLN
INTRAMUSCULAR | Status: AC
  Filled 2021-04-25: qty 2

## 2021-04-25 MED ORDER — LABETALOL HCL 5 MG/ML IV SOLN: 10.0000 mg | INTRAVENOUS | Status: DC | PRN

## 2021-04-25 MED ORDER — NITROGLYCERIN 1 MG/10 ML FOR IR/CATH LAB
INTRA_ARTERIAL | Status: DC | PRN
  Administered 2021-04-25 (×2): 200 ug via INTRACORONARY

## 2021-04-25 MED ORDER — HYDRALAZINE HCL 20 MG/ML IJ SOLN: 10.0000 mg | INTRAMUSCULAR | Status: DC | PRN

## 2021-04-25 MED ORDER — IOHEXOL 350 MG/ML SOLN
INTRAVENOUS | Status: DC | PRN
  Administered 2021-04-25: 85 mL

## 2021-04-25 MED ORDER — FENTANYL CITRATE (PF) 100 MCG/2ML IJ SOLN
INTRAMUSCULAR | Status: AC
  Filled 2021-04-25: qty 2

## 2021-04-25 MED ORDER — FENTANYL CITRATE (PF) 100 MCG/2ML IJ SOLN
INTRAMUSCULAR | Status: DC | PRN
  Administered 2021-04-25: 25 ug via INTRAVENOUS

## 2021-04-25 MED ORDER — HEPARIN SODIUM (PORCINE) 1000 UNIT/ML IJ SOLN
INTRAMUSCULAR | Status: DC | PRN
  Administered 2021-04-25 (×2): 5000 [IU] via INTRAVENOUS
  Administered 2021-04-25: 2000 [IU] via INTRAVENOUS

## 2021-04-25 MED ORDER — SODIUM CHLORIDE 0.9 % IV SOLN: INTRAVENOUS | Status: DC

## 2021-04-25 MED ORDER — LIDOCAINE HCL (PF) 1 % IJ SOLN
INTRAMUSCULAR | Status: AC
  Filled 2021-04-25: qty 30

## 2021-04-25 MED ORDER — SODIUM CHLORIDE 0.9 % WEIGHT BASED INFUSION: 1.0000 mL/kg/h | INTRAVENOUS | Status: DC

## 2021-04-25 MED ORDER — MIDAZOLAM HCL 2 MG/2ML IJ SOLN
INTRAMUSCULAR | Status: DC | PRN
  Administered 2021-04-25: 1 mg via INTRAVENOUS

## 2021-04-25 MED ORDER — ASPIRIN 81 MG PO CHEW: 81.0000 mg | CHEWABLE_TABLET | ORAL | Status: DC

## 2021-04-25 MED ORDER — ACETAMINOPHEN 325 MG PO TABS: 650.0000 mg | ORAL_TABLET | ORAL | Status: DC | PRN

## 2021-04-25 MED ORDER — NITROGLYCERIN 1 MG/10 ML FOR IR/CATH LAB
INTRA_ARTERIAL | Status: AC
  Filled 2021-04-25: qty 10

## 2021-04-25 MED ORDER — SODIUM CHLORIDE 0.9 % WEIGHT BASED INFUSION
3.0000 mL/kg/h | INTRAVENOUS | Status: AC
  Administered 2021-04-25: 3 mL/kg/h via INTRAVENOUS

## 2021-04-25 MED ORDER — HEPARIN (PORCINE) IN NACL 1000-0.9 UT/500ML-% IV SOLN
INTRAVENOUS | Status: DC | PRN
  Administered 2021-04-25 (×2): 500 mL

## 2021-04-25 SURGICAL SUPPLY — 13 items
CATH 5FR JL3.5 JR4 ANG PIG MP (CATHETERS) ×1 IMPLANT
CATH LAUNCHER 5F JR4 (CATHETERS) ×1 IMPLANT
CATH LAUNCHER 6FR EBU3.5 (CATHETERS) ×1 IMPLANT
DEVICE RAD COMP TR BAND LRG (VASCULAR PRODUCTS) ×1 IMPLANT
GLIDESHEATH SLEND SS 6F .021 (SHEATH) ×1 IMPLANT
GUIDEWIRE INQWIRE 1.5J.035X260 (WIRE) IMPLANT
GUIDEWIRE PRESSURE X 175 (WIRE) ×1 IMPLANT
INQWIRE 1.5J .035X260CM (WIRE) ×2
KIT ESSENTIALS PG (KITS) ×1 IMPLANT
KIT HEART LEFT (KITS) ×2 IMPLANT
PACK CARDIAC CATHETERIZATION (CUSTOM PROCEDURE TRAY) ×2 IMPLANT
TRANSDUCER W/STOPCOCK (MISCELLANEOUS) ×2 IMPLANT
TUBING CIL FLEX 10 FLL-RA (TUBING) ×2 IMPLANT

## 2021-04-25 NOTE — Interval H&P Note (Signed)
History and Physical Interval Note:  04/25/2021 10:54 AM  Huey Romans  has presented today for surgery, with the diagnosis of stable angina.  The various methods of treatment have been discussed with the patient and family. After consideration of risks, benefits and other options for treatment, the patient has consented to  Procedure(s): LEFT HEART CATH AND CORONARY ANGIOGRAPHY (N/A) as a surgical intervention.  The patient's history has been reviewed, patient examined, no change in status, stable for surgery.  I have reviewed the patient's chart and labs.  Questions were answered to the patient's satisfaction.    Cath Lab Visit (complete for each Cath Lab visit)  Clinical Evaluation Leading to the Procedure:   ACS: No.  Non-ACS:    Anginal Classification: CCS IV  Anti-ischemic medical therapy: Maximal Therapy (2 or more classes of medications)  Non-Invasive Test Results: No non-invasive testing performed  Prior CABG: No previous CABG  Krystalynn Ridgeway

## 2021-04-26 ENCOUNTER — Encounter (HOSPITAL_COMMUNITY): Payer: Self-pay | Admitting: Internal Medicine

## 2021-05-01 ENCOUNTER — Other Ambulatory Visit: Payer: Self-pay

## 2021-05-01 DIAGNOSIS — R400 Somnolence: Secondary | ICD-10-CM

## 2021-05-01 NOTE — Progress Notes (Signed)
Called patient notified him that Dr. [name] would like him to complete a home sleep study.  Most of stopbang questions answered.  Will send to home sleep study staff member to follow up.

## 2021-05-02 ENCOUNTER — Telehealth: Payer: Self-pay | Admitting: *Deleted

## 2021-05-02 NOTE — Telephone Encounter (Signed)
I s/w pt and stated Dr. Whitney Post is wanting to have him do a [facility] Sleep Study. I asked the pt if he would be able to stop by the office tomorrow for a quick 10-15 minutes to set up sleep study and go over instructions. Pt is agreeable and will stop tomorrow 05/03/21 1 pm. I told Austin Oliver to let the girls at the front desk know that he is here to see [name] about a sleep study.

## 2021-05-03 NOTE — Telephone Encounter (Signed)
Pt came in the office today and has been given instructions about [facility] Sleep Study. Pt was able to upload [place] onto his phone. We previewed together the instructions. Pt is aware NOT to open the sleep study box until I call him and give him the PIN# [id number]. Pt aware if insurance denied then will need to bring the sleep study unopened back to the office. Pt aware if denied an box is open he will be charged $100 and the office will not accept the box back. Pt verbalized understanding to this plan of care. I have entered in the stopbang.  Patient Name: Austin Oliver        DOB: 2056-06-02      Height: 5\' 8"     Weight: 226  Office Name: [facility]         Referring Provider: Dr. [name]  Today's Date: 05/03/21  Date:   STOP BANG RISK ASSESSMENT S (snore) Have you been told that you snore?     YES   T (tired) Are you often tired, fatigued, or sleepy during the day?   YES  O (obstruction) Do you stop breathing, choke, or gasp during sleep? NO   P (pressure) Do you have or are you being treated for high blood pressure? NO   B (BMI) Is your body index greater than 35 kg/m? NO   A (age) Are you 66 years old or older? YES   N (neck) Do you have a neck circumference greater than 16 inches?   YES   G (gender) Are you a male? YES   TOTAL STOP/BANG "YES" ANSWERS 5                                                                       For Office Use Only              Procedure Order Form    YES to 3+ Stop Bang questions OR two clinical symptoms - patient qualifies for WatchPAT (CPT 95800)             Clinical Notes: Will consult Sleep Specialist and refer for management of therapy due to patient increased risk of Sleep Apnea. Ordering a sleep study due to the following two clinical symptoms: Excessive daytime sleepiness G47.10 / Gastroesophageal reflux K21.9 / Nocturia R35.1 / Morning Headaches G44.221 / Difficulty concentrating R41.840 / Memory problems or poor judgment  G31.84 / Personality changes or irritability R45.4 / Loud snoring R06.83 / Depression F32.9 / Unrefreshed by sleep G47.8 / Impotence N52.9 / History of high blood pressure R03.0 / Insomnia G47.00    I understand that I am proceeding with a home sleep apnea test as ordered by my treating physician. I understand that untreated sleep apnea is a serious cardiovascular risk factor and it is my responsibility to perform the test and seek management for sleep apnea. I will be contacted with the results and be managed for sleep apnea by a local sleep physician. I will be receiving equipment and further instructions from [facility]. I shall promptly ship back the equipment via the included mailing label. I understand my insurance will be billed for the test and as the patient I am responsible for any  insurance related out-of-pocket costs incurred. I have been provided with written instructions and can call for additional video or telephonic instruction, with 24-hour availability of qualified personnel to answer any questions: Patient Help Desk [phone].  Patient Signature ______________________________________________________   Date______________________ Patient Telemedicine Verbal Consent

## 2021-05-10 NOTE — Telephone Encounter (Signed)
Pt sent MY CHART message today, that he had not heard anything yet about [facility] Sleep Study if ok to proceed. I replied back to the pt through [place] and assured him that I have sent a message to the our sleep authorization dept and have asked them to please look in this. Within a few minutes I did receive from the sleep auth dept that the pt is ok to proceed with [facility] Sleep Study. I then called the pt and apologized for the delay. I did assure him that he is ok to proceed with sleep study and have been given the PIN# [id number]. Pt will do study either tonight or this weekend. Pt did want to verify where the sensor sits on his throat/chest area. I confirmed the sensor will sit at the sternal notch which is just below the trachea area on his throat. Pt has given me verbal understanding to plan of care and thanked me for the help today. I did assure the pt once they study is completed, it will be read by the cardiologist. I did let pt that may be 1-2 weeks, just depending how fast the study can be read. Pt is again thanked me for the help.

## 2021-05-11 ENCOUNTER — Encounter (INDEPENDENT_AMBULATORY_CARE_PROVIDER_SITE_OTHER): Payer: Medicare Other | Admitting: Cardiology

## 2021-05-11 DIAGNOSIS — G4733 Obstructive sleep apnea (adult) (pediatric): Secondary | ICD-10-CM

## 2021-05-12 ENCOUNTER — Ambulatory Visit: Payer: Medicare Other

## 2021-05-12 DIAGNOSIS — R400 Somnolence: Secondary | ICD-10-CM

## 2021-05-12 NOTE — Procedures (Signed)
   Sleep Study Report  Patient Information Study Date: May 11, 2021 Patient Name: Austin Oliver Patient ID: [id number] Birth Date: Jun 27, 1956 Age: 65 Gender: Male BMI: 34.4 (W=227 lb, H=5' 8'') Neck Circ.: 65 '' Referring Physician: [name], MD  TEST DESCRIPTION: Home sleep apnea testing was completed using the WatchPat, a Type 1 device, utilizing peripheral arterial tonometry (PAT), chest movement, actigraphy, pulse oximetry, pulse rate, body position and snore. AHI was calculated with apnea and hypopnea using valid sleep time as the denominator. RDI includes apneas, hypopneas, and RERAs. The data acquired and the scoring of sleep and all associated events were performed in accordance with the recommended standards and specifications as outlined in the AASM Manual for the Scoring of Sleep and Associated Events 2.2.0 (2015).   FINDINGS:  1. Mild Obstructive Sleep Apnea with AHI 7.8/hr.  2. No Central Sleep Apnea with pAHIc 0/hr.  3. Oxygen desaturations as low as 88%.  4. Mild snoring was present. O2 sats were < 88% for 0.2 min.  5. Total sleep time was 8 hrs and 14 min.  6. 25.9% of total sleep time was spent in REM sleep.  7. Normal sleep onset latency at 19 min.  8. Prolonged REM sleep onset latency at 146 min.  9. Total awakenings were 4.   DIAGNOSIS: Mild Obstructive Sleep Apnea (G47.33)  RECOMMENDATIONS: 1. Clinical correlation of these findings is necessary. The decision to treat obstructive sleep apnea (OSA) is usually based on the presence of apnea symptoms or the presence of associated medical conditions such as Hypertension, Congestive Heart Failure, Atrial Fibrillation or Obesity. The most common symptoms of OSA are snoring, gasping for breath while sleeping, daytime sleepiness and fatigue.   2. Initiating apnea therapy is recommended given the presence of symptoms and/or associated conditions.   Recommend proceeding with one of the following:   a.  Auto-CPAP therapy with a pressure range of 5-20cm H2O.   b. An oral appliance (OA) that can be obtained from certain dentists with expertise in sleep medicine. These are primarily of use in non-obese patients with mild and moderate disease.   c. An ENT consultation which may be useful to look for specific causes of obstruction and possible treatment  options.   d. If patient is intolerant to PAP therapy, consider referral to ENT for evaluation for hypoglossal nerve stimulator.   3. Close follow-up is necessary to ensure success with CPAP or oral appliance therapy for maximum benefit .  4. A follow-up oximetry study on CPAP is recommended to assess the adequacy of therapy and determine the need for supplemental oxygen or the potential need for Bi-level therapy. An arterial blood gas to determine the adequacy of baseline ventilation and oxygenation should also be considered.  5. Healthy sleep recommendations include: adequate nightly sleep (normal 7-9 hrs/night), avoidance of caffeine afternoon and alcohol near bedtime, and maintaining a sleep environment that is cool, dark and quiet.  6. Weight loss for overweight patients is recommended. Even modest amounts of weight loss can significantly  improve the severity of sleep apnea.  7. Snoring recommendations include: weight loss where appropriate, side sleeping, and avoidance of alcohol before bed.  8. Operation of motor vehicle or dangerous equipment must be avoided when feeling drowsy, excessively sleepy, or mentally fatigued.  Signature: Electronically Signed: May 12, 2021 Fransico Him, MD; [facility]; [place], American Board of  Sleep Medicine Report prepared by: Fransico Him, MD

## 2021-05-13 ENCOUNTER — Telehealth: Payer: Self-pay | Admitting: *Deleted

## 2021-05-13 DIAGNOSIS — G4733 Obstructive sleep apnea (adult) (pediatric): Secondary | ICD-10-CM

## 2021-05-13 NOTE — Telephone Encounter (Signed)
The patient has been notified of the result and verbalized understanding.  All questions (if any) were answered. Austin Oliver, [name] 05/13/2021 5:32 PM    Upon patient request DME selection is [facility] Patient understands he will be contacted by [facility] to set up his cpap. Patient understands to call if [facility] does not contact him with new setup in a timely manner. Patient understands they will be called once confirmation has been received from [facility] that they have received their new machine to schedule 10 week follow up appointment.   [facility] notified of new cpap order  Please add to airview Patient was grateful for the call and thanked me

## 2021-05-13 NOTE — Telephone Encounter (Signed)
-----   Message from Sueanne Margarita, MD sent at 05/12/2021 11:07 AM EDT ----- Please let patient know that they have sleep apnea and recommend treating with CPAP.  Please order an auto CPAP from 4-15cm H2O with heated humidity and mask of choice.  Order overnight pulse ox on CPAP.  Followup with me in 6 weeks.

## 2021-05-23 ENCOUNTER — Ambulatory Visit: Payer: Medicare Other | Admitting: Internal Medicine

## 2021-06-05 ENCOUNTER — Ambulatory Visit (INDEPENDENT_AMBULATORY_CARE_PROVIDER_SITE_OTHER): Payer: Medicare Other

## 2021-06-05 DIAGNOSIS — I495 Sick sinus syndrome: Secondary | ICD-10-CM

## 2021-06-10 LAB — CUP PACEART REMOTE DEVICE CHECK
Battery Remaining Longevity: 107 mo
Battery Remaining Percentage: 95.5 %
Battery Voltage: 3.01 V
Brady Statistic AP VP Percent: 1 %
Brady Statistic AP VS Percent: 90 %
Brady Statistic AS VP Percent: 1 %
Brady Statistic AS VS Percent: 8.7 %
Brady Statistic RA Percent Paced: 90 %
Brady Statistic RV Percent Paced: 1.2 %
Date Time Interrogation Session: 20220719175708
Implantable Lead Implant Date: 20220118
Implantable Lead Implant Date: 20220118
Implantable Lead Location: 753859
Implantable Lead Location: 753860
Implantable Pulse Generator Implant Date: 20220118
Lead Channel Impedance Value: 450 Ohm
Lead Channel Impedance Value: 530 Ohm
Lead Channel Pacing Threshold Amplitude: 0.75 V
Lead Channel Pacing Threshold Amplitude: 0.75 V
Lead Channel Pacing Threshold Pulse Width: 0.5 ms
Lead Channel Pacing Threshold Pulse Width: 0.5 ms
Lead Channel Sensing Intrinsic Amplitude: 5 mV
Lead Channel Sensing Intrinsic Amplitude: 7.5 mV
Lead Channel Setting Pacing Amplitude: 2 V
Lead Channel Setting Pacing Amplitude: 2.5 V
Lead Channel Setting Pacing Pulse Width: 0.5 ms
Lead Channel Setting Sensing Sensitivity: 2 mV
Pulse Gen Model: 2272
Pulse Gen Serial Number: 3891376

## 2021-06-17 NOTE — Progress Notes (Signed)
Cardiology Office Note:    Date:  06/18/2021   ID:  Austin Oliver, DOB [birth year], MRN [id number]  PCP:  [name], MD  [facility] Cardiologist:  [name], MD   CC: Follow up after repeat cath  History of Present Illness:    Austin Oliver is a 65 y.o. male with a hx of Sinus Bradycardia, Aortic Atherosclerosis & HLD with statin intolerance, HTN who presented 08/01/19 with unstable angina.  Shared decision making for NM Stress showed no lesion but without resolution of pain, received LCP with ostial LAD lesion s/p PCI 08/24/20.  Residual pain 08/27/20 with increase in his Imdur.  Received PPM in interval 12/04/20 [facility] PPM.  Would check also scheduled for 12/18/20.In interim of this visit, patient CP had improved.  No angina at EP 03/06/21.  At last visit worsening chest pain despite maximal therapy.  Repeat LHC showed no changes.  Since then his pain has improved.  Seen 06/18/21.  Patient notes that he is doing better.  Since last visit notes no further chest pain changes.  Relevant interval testing or therapy include repeat LHC that was unchanged.  There are no interval hospital/ED visit.    Chest pain is much improved from prior.   Notes persistent SOB that he thinks may be worsened by his COPD. No weight gain or leg swelling.  No palpitations or syncope.  Past Medical History:  Diagnosis Date   Bradycardia    Coronary artery calcification seen on CAT scan 01/14/2017   Essential hypertension 09/11/2020   Family history of thyroid problem    GERD (gastroesophageal reflux disease)    History of kidney stones    Hyperlipidemia    Hypothyroidism    Osteoarthritis    Shingles 2012   SOB (shortness of breath) on exertion    Thyroid disease     Past Surgical History:  Procedure Laterality Date   CORONARY STENT INTERVENTION N/A 08/24/2020   Procedure: CORONARY STENT INTERVENTION;  Surgeon: [name], MD;  Location: [facility];  Service: Cardiovascular;   Laterality: N/A;   HEEL SPUR SURGERY Left 80's   INTRAVASCULAR PRESSURE WIRE/FFR STUDY N/A 04/25/2021   Procedure: INTRAVASCULAR PRESSURE WIRE/FFR STUDY;  Surgeon: [name], MD;  Location: [place] [facility];  Service: Cardiovascular;  Laterality: N/A;   INTRAVASCULAR ULTRASOUND/IVUS N/A 08/24/2020   Procedure: Intravascular Ultrasound/IVUS;  Surgeon: [name], MD;  Location: [facility];  Service: Cardiovascular;  Laterality: N/A;   KNEE SURGERY Bilateral    numerous times   LEFT HEART CATH AND CORONARY ANGIOGRAPHY N/A 08/24/2020   Procedure: LEFT HEART CATH AND CORONARY ANGIOGRAPHY;  Surgeon: [name], MD;  Location: [place] [facility];  Service: Cardiovascular;  Laterality: N/A;   LEFT HEART CATH AND CORONARY ANGIOGRAPHY N/A 04/25/2021   Procedure: LEFT HEART CATH AND CORONARY ANGIOGRAPHY;  Surgeon: [name], MD;  Location: [place];  Service: Cardiovascular;  Laterality: N/A;   NECK SURGERY  70's   PACEMAKER IMPLANT N/A 12/04/2020   Procedure: PACEMAKER IMPLANT;  Surgeon: [name], MD;  Location: [facility] [place] [facility];  Service: Cardiovascular;  Laterality: N/A;   TOTAL HIP ARTHROPLASTY Left 03/09/2017   Procedure: LEFT TOTAL HIP ARTHROPLASTY ANTERIOR APPROACH;  Surgeon: Rod Can, MD;  Location: [facility];  Service: Orthopedics;  Laterality: Left;  Dr. requesting RNFA    Current Medications: Current Meds  Medication Sig   acetaminophen (TYLENOL) 500 MG tablet Take 1,000 mg by mouth  every 6 (six) hours as needed (pain.).   albuterol (VENTOLIN HFA) 108 (90 Base) MCG/ACT inhaler Inhale into the lungs as needed.   aspirin EC 81 MG tablet Take 81 mg by mouth daily at 4 PM. Swallow whole.   cholecalciferol (VITAMIN D3) 25 MCG (1000 UNIT) tablet Take 1,000 Units by mouth in the morning.   clopidogrel (PLAVIX) 75 MG tablet Take 1 tablet (75 mg total) by mouth daily.   ezetimibe (ZETIA) 10 MG tablet Take 10 mg by mouth in the morning.    finasteride (PROSCAR) 5 MG tablet Take 5 mg by mouth daily.   furosemide (LASIX) 20 MG tablet Take 1 tablet (20 mg total) by mouth daily.   gabapentin (NEURONTIN) 100 MG capsule Take 200 mg by mouth at bedtime.   ibuprofen (ADVIL) 200 MG tablet Take 400 mg by mouth every 8 (eight) hours as needed (inflammation/pain.).   isosorbide mononitrate (IMDUR) 120 MG 24 hr tablet Take 1 tablet (120 mg total) by mouth daily.   ketotifen (ZADITOR) 0.025 % ophthalmic solution Place 1 drop into both eyes 2 (two) times daily as needed (for allergy eyes).   levothyroxine (SYNTHROID) 112 MCG tablet Take 112 mcg by mouth daily before breakfast.   loteprednol (LOTEMAX) 0.5 % ophthalmic suspension Place 1 drop into the left eye 2 (two) times a week. Mondays & Fridays   nitroGLYCERIN (NITROSTAT) 0.4 MG SL tablet Place 1 tablet (0.4 mg total) under the tongue every 5 (five) minutes x 3 doses as needed for chest pain.   pantoprazole (PROTONIX) 40 MG tablet Take 40 mg by mouth daily after supper.    psyllium (METAMUCIL) 58.6 % powder Take 1 packet by mouth 3 (three) times daily after meals.   ranolazine (RANEXA) 1000 MG SR tablet Take 1 tablet (1,000 mg total) by mouth 2 (two) times daily.   rosuvastatin (CRESTOR) 5 MG tablet Take 1 tablet (5 mg total) by mouth daily.   shark liver oil-cocoa butter (PREPARATION H) 0.25-3-85.5 % suppository Place 1 suppository rectally daily as needed for hemorrhoids.   tamsulosin (FLOMAX) 0.4 MG CAPS capsule Take 0.8 mg by mouth daily with supper.   vitamin B-12 (CYANOCOBALAMIN) 1000 MCG tablet Take 1,000 mcg by mouth in the morning.     Allergies:   Bee venom, Statins, and Cleocin [clindamycin hcl]   Social History   Socioeconomic History   Marital status: Married    Spouse name: [name]   Number of children: 2   Years of education: Not on file   Highest education level: Not on file  Occupational History   Occupation: Animator  Tobacco Use   Smoking status: Former     Types: Cigarettes    Quit date: 12/05/2016    Years since quitting: 4.5   Smokeless tobacco: Never  Substance and Sexual Activity   Alcohol use: No   Drug use: No   Sexual activity: Not on file  Other Topics Concern   Not on file  Social History Narrative   Not on file   Social Determinants of Health   Financial Resource Strain: Not on file  Food Insecurity: Not on file  Transportation Needs: Not on file  Physical Activity: Not on file  Stress: Not on file  Social Connections: Not on file    Social: Comes up with his wife  Family History: The patient's family history includes COPD in his father; Dementia in his mother; Heart Problems in his father; Lung cancer in his father.  ROS:  Please see the history of present illness.     All other systems reviewed and are negative.  EKGs/Labs/Other Studies Reviewed:    The following studies were reviewed today:  EKG:   04/04/21: Sr rate 62 1st HB Pr 202 08/24/20 marked sinus bradycardia rate 40, no ST/T changes 07/31/20 sinus bradycardia rate of 51, with possible anterior q waves OSH 07/16/20:  Sinus bradycardia rate of 48 without ST/T changes or q waves.  Transthoracic Echocardiogram: Date: 10/02/20 Results: 1. Left ventricular ejection fraction, by estimation, is 50 to 55%. The  left ventricle has low normal function. The left ventricle has no regional  wall motion abnormalities. The left ventricular internal cavity size was  mildly dilated. Left ventricular  diastolic parameters were normal.   2. Right ventricular systolic function is normal. The right ventricular  size is normal. Tricuspid regurgitation signal is inadequate for assessing  PA pressure.   3. Left atrial size was mildly dilated.   4. The mitral valve is normal in structure. No evidence of mitral valve  regurgitation.   5. The aortic valve is tricuspid. There is mild calcification of the  aortic valve. There is mild thickening of the aortic valve. Aortic  valve  regurgitation is trivial. Mild aortic valve sclerosis is present, with no  evidence of aortic valve stenosis.   6. The inferior vena cava is normal in size with greater than 50%  respiratory variability, suggesting right atrial pressure of 3 mmHg.  NonCardiac CT : Date: 08/27/2020 Results: Aortic Atherosclerosis with notable LAD disease  NM Stress Testing: Date: 08/06/20 Results: NM Stress 08/06/20 Nuclear stress EF: 57%. There was no ST segment deviation noted during stress. The study is normal. This is a low risk study. The left ventricular ejection fraction is normal (55-65%).   Normal pharmacologic nuclear stress test with no evidence for prior infarct or ischemia. LVEF 57%. Left ventricle appears mildly dilated.  Left/Right Heart Catheterizations: Date:08/24/20 Results: Conclusions: Severe single-vessel coronary artery disease with sequential 90% ostial/proximal, 40% mid, and 50% mid/distal LAD stenoses as well as 60% D3 lesion. Moderate, non-obstructive coronary artery disease involving the LCx and RCA. Normal left ventricular systolic function with upper normal filling pressure. Successful PCI to the ostial/proximal LAD using Resolute Onyx 3.0 x 12 mm drug-eluting stent (postdilated to 3.6 mm) with 0% residual stenosis and TIMI-3 flow.   Recommendations: Dual antiplatelet therapy with aspirin and ticagrelor for 12 months. Aggressive secondary prevention. Medical therapy of non-critical mid/distal LAD, LCx, and RCA disease. Anticipate same-day discharge if no post-catheterization complications occur.  Date: 04/25/21 Results:  Conclusions: Mild to moderate, non-obstructive coronary artery disease, as detailed below.  Overall appearance is similar to completion of last year's catheterization.  LAD, D3, and RCA/rPLA disease is not hemodynamically significant by RFR. Widely patent ostial/proximal LAD stent. Mildly elevated left ventricular filling pressure (LVEDP 15-20  mmHg).   Recommendations: Continue aggressive secondary prevention of coronary artery disease. Consider workup for alternative etiologies of persistent atypical chest pain and fatigue.   Recent Labs: 11/30/2020: ALT 20 04/04/2021: NT-Pro BNP 46 04/16/2021: BUN 22; Creatinine, Ser 1.34; Hemoglobin 12.7; Magnesium 1.9; Platelets 120; Potassium 4.6; Sodium 139  Recent Lipid Panel    Component Value Date/Time   CHOL 158 08/22/2020 1044   TRIG 88 08/22/2020 1044   HDL 35 (L) 08/22/2020 1044   CHOLHDL 4.5 08/22/2020 1044   LDLCALC 106 (H) 08/22/2020 1044    Physical Exam:    VS:  BP 112/60   Pulse 60  Ht '5\' 8"'$  (1.727 m)   Wt 230 lb (104.3 kg)   SpO2 97%   BMI 34.97 kg/m     Wt Readings from Last 3 Encounters:  06/18/21 230 lb (104.3 kg)  04/25/21 226 lb (102.5 kg)  04/04/21 228 lb 9.6 oz (103.7 kg)    GEN: Well nourished, well developed in no acute distress HEENT: Normal NECK: No JVD LYMPHATICS: No lymphadenopathy CARDIAC: RRR, no murmurs, rubs, gallops RESPIRATORY:  Clear to auscultation without rales, wheezing or rhonchi  ABDOMEN: Soft, non-tender, non-distended MUSCULOSKELETAL:  no edema l; No deformity  SKIN: Warm and dry NEUROLOGIC:  Alert and oriented x 3 PSYCHIATRIC:  Normal affect   ASSESSMENT:    1. Chronic obstructive pulmonary disease, unspecified COPD type ([place])   2. Coronary artery disease involving native coronary artery of native heart without angina pectoris   3. Essential hypertension   4. Aortic atherosclerosis ([place])   5. Coronary artery disease of native artery of native heart with stable angina pectoris ([place])   6. Morbid obesity ([place])   7. DOE (dyspnea on exertion)     PLAN:    In order of problems listed above:  Coronary Artery Disease; Obstructive with stable angina. COPD DOE Morbid Obesity with Hyperlipidemia (mixed) Prior Statin Intolerance- tolerating current rosuvastatin Cirrhosis of the liver Aortic Atherosclerosis Chronotropic  incompetence s/p PPM Mixed macrocytic anemia-> Hgb up to 12.2 - asymptomatic  - anatomy: anatomy: PCi LAD, 40% mLAD, 50% dLAD, 60% D3, < 50% RCA and LCx - continue ASA 81 mg; Continue plavix unitl 08/2020 - continue statin and zetia - continue nitrates; Imdur 120 PO Daily - Ranexa 1000 mg - concerned that patient my not tolerate BB; heart rate at 60 and s/p PPM - seen by lipid clinic in the past - on lasix 20 mg PO Daily  - will get PFTs and potential Pulm f/u  if no improvement with increase - will reach out to Dr. Radford Pax to coordinate sleep mechanics - discussed with Dr. Curt Bears- likely will note see benefit from fatigue for increasing heart rate  Will plan to start plavix 10/1  Reasonable to proceed to surgery unless new changes (Knee surgery for Dr. Lyla Glassing)  Time Spent Directly with Patient:   I have spent a total of 45 minutes with the patient reviewing notes, imaging, EKGs, labs and examining the patient as well as establishing an assessment and plan that was discussed personally with the patient.  > 50% of time was spent in direct patient care and/or family.    Medication Adjustments/Labs and Tests Ordered: Current medicines are reviewed at length with the patient today.  Concerns regarding medicines are outlined above.  Orders Placed This Encounter  Procedures   Pulmonary Function Test    No orders of the defined types were placed in this encounter.   Patient Instructions  Medication Instructions:  Your physician recommends that you continue on your current medications as directed. Please refer to the Current Medication list given to you today.  YOU CAN STOP TAKING CLOPIDOGREL (PLAVIX) ON August 17, 2021  *If you need a refill on your cardiac medications before your next appointment, please call your pharmacy*   Lab Work: NONE If you have labs (blood work) drawn today and your tests are completely normal, you will receive your results only by: [place]  (if you have MyChart) OR A paper copy in the mail If you have any lab test that is abnormal or we need to change your treatment,  we will call you to review the results.   Testing/Procedures: Your physician has recommended that you have a pulmonary function test. Pulmonary Function Tests are a group of tests that measure how well air moves in and out of your lungs.    Follow-Up: At [facility], you and your health needs are our priority.  As part of our continuing mission to provide you with exceptional heart care, we have created designated Provider Care Teams.  These Care Teams include your primary Cardiologist (physician) and Advanced Practice Providers (APPs -  Physician Assistants and Nurse Practitioners) who all work together to provide you with the care you need, when you need it.  We recommend signing up for the patient portal called "MyChart".  Sign up information is provided on this After Visit Summary.  MyChart is used to connect with patients for Virtual Visits (Telemedicine).  Patients are able to view lab/test results, encounter notes, upcoming appointments, etc.  Non-urgent messages can be sent to your provider as well.   To learn more about what you can do with MyChart, go to [email or web address].    Your next appointment:   5-6 month(s)  The format for your next appointment:   In Person  Provider:   You may see [name], MD or one of the following Advanced Practice Providers on your designated Care Team:   Melina Copa, PA-C Ermalinda Barrios, PA-C        Signed, [name], MD  06/18/2021 1:35 PM    [place]

## 2021-06-18 ENCOUNTER — Ambulatory Visit (INDEPENDENT_AMBULATORY_CARE_PROVIDER_SITE_OTHER): Payer: Medicare Other | Admitting: Internal Medicine

## 2021-06-18 ENCOUNTER — Encounter: Payer: Self-pay | Admitting: Internal Medicine

## 2021-06-18 ENCOUNTER — Other Ambulatory Visit: Payer: Self-pay

## 2021-06-18 VITALS — BP 112/60 | HR 60 | Ht 68.0 in | Wt 230.0 lb

## 2021-06-18 DIAGNOSIS — J449 Chronic obstructive pulmonary disease, unspecified: Secondary | ICD-10-CM

## 2021-06-18 DIAGNOSIS — I10 Essential (primary) hypertension: Secondary | ICD-10-CM | POA: Diagnosis not present

## 2021-06-18 DIAGNOSIS — R0609 Other forms of dyspnea: Secondary | ICD-10-CM

## 2021-06-18 DIAGNOSIS — I700 Atherosclerosis of aorta: Secondary | ICD-10-CM | POA: Diagnosis not present

## 2021-06-18 DIAGNOSIS — I2510 Atherosclerotic heart disease of native coronary artery without angina pectoris: Secondary | ICD-10-CM | POA: Diagnosis not present

## 2021-06-18 DIAGNOSIS — R0600 Dyspnea, unspecified: Secondary | ICD-10-CM

## 2021-06-18 DIAGNOSIS — I25118 Atherosclerotic heart disease of native coronary artery with other forms of angina pectoris: Secondary | ICD-10-CM

## 2021-06-18 NOTE — Patient Instructions (Signed)
Medication Instructions:  Your physician recommends that you continue on your current medications as directed. Please refer to the Current Medication list given to you today.  YOU CAN STOP TAKING CLOPIDOGREL (PLAVIX) ON August 17, 2021  *If you need a refill on your cardiac medications before your next appointment, please call your pharmacy*   Lab Work: NONE If you have labs (blood work) drawn today and your tests are completely normal, you will receive your results only by: [place] (if you have MyChart) OR A paper copy in the mail If you have any lab test that is abnormal or we need to change your treatment, we will call you to review the results.   Testing/Procedures: Your physician has recommended that you have a pulmonary function test. Pulmonary Function Tests are a group of tests that measure how well air moves in and out of your lungs.    Follow-Up: At [facility], you and your health needs are our priority.  As part of our continuing mission to provide you with exceptional heart care, we have created designated Provider Care Teams.  These Care Teams include your primary Cardiologist (physician) and Advanced Practice Providers (APPs -  Physician Assistants and Nurse Practitioners) who all work together to provide you with the care you need, when you need it.  We recommend signing up for the patient portal called "MyChart".  Sign up information is provided on this After Visit Summary.  MyChart is used to connect with patients for Virtual Visits (Telemedicine).  Patients are able to view lab/test results, encounter notes, upcoming appointments, etc.  Non-urgent messages can be sent to your provider as well.   To learn more about what you can do with MyChart, go to [email or web address].    Your next appointment:   5-6 month(s)  The format for your next appointment:   In Person  Provider:   You may see [name], MD or one of the following Advanced  Practice Providers on your designated Care Team:   Melina Copa, PA-C Ermalinda Barrios, PA-C

## 2021-06-25 ENCOUNTER — Other Ambulatory Visit: Payer: Self-pay | Admitting: Internal Medicine

## 2021-06-27 NOTE — Progress Notes (Signed)
Remote pacemaker transmission.   

## 2021-07-09 ENCOUNTER — Other Ambulatory Visit: Payer: Self-pay

## 2021-07-09 ENCOUNTER — Ambulatory Visit (INDEPENDENT_AMBULATORY_CARE_PROVIDER_SITE_OTHER): Payer: Medicare Other | Admitting: Internal Medicine

## 2021-07-09 DIAGNOSIS — J449 Chronic obstructive pulmonary disease, unspecified: Secondary | ICD-10-CM

## 2021-07-09 LAB — PULMONARY FUNCTION TEST
DL/VA % pred: 87 %
DL/VA: 3.6 ml/min/mmHg/L
DLCO cor % pred: 83 %
DLCO cor: 21.51 ml/min/mmHg
DLCO unc % pred: 83 %
DLCO unc: 21.51 ml/min/mmHg
FEF 25-75 Post: 2.2 L/sec
FEF 25-75 Pre: 2.14 L/sec
FEF2575-%Change-Post: 2 %
FEF2575-%Pred-Post: 85 %
FEF2575-%Pred-Pre: 83 %
FEV1-%Change-Post: 1 %
FEV1-%Pred-Post: 93 %
FEV1-%Pred-Pre: 92 %
FEV1-Post: 3.05 L
FEV1-Pre: 3.02 L
FEV1FVC-%Change-Post: 0 %
FEV1FVC-%Pred-Pre: 96 %
FEV6-%Change-Post: 0 %
FEV6-%Pred-Post: 101 %
FEV6-%Pred-Pre: 100 %
FEV6-Post: 4.22 L
FEV6-Pre: 4.18 L
FEV6FVC-%Change-Post: 0 %
FEV6FVC-%Pred-Post: 105 %
FEV6FVC-%Pred-Pre: 104 %
FVC-%Change-Post: 0 %
FVC-%Pred-Post: 95 %
FVC-%Pred-Pre: 95 %
FVC-Post: 4.22 L
FVC-Pre: 4.21 L
Post FEV1/FVC ratio: 72 %
Post FEV6/FVC ratio: 100 %
Pre FEV1/FVC ratio: 72 %
Pre FEV6/FVC Ratio: 99 %
RV % pred: 177 %
RV: 4.11 L
TLC % pred: 119 %
TLC: 8.13 L

## 2021-07-09 NOTE — Progress Notes (Signed)
PFT done today. 

## 2021-07-11 ENCOUNTER — Telehealth: Payer: Self-pay | Admitting: Internal Medicine

## 2021-07-11 DIAGNOSIS — R0602 Shortness of breath: Secondary | ICD-10-CM

## 2021-07-11 DIAGNOSIS — J449 Chronic obstructive pulmonary disease, unspecified: Secondary | ICD-10-CM

## 2021-07-11 NOTE — Telephone Encounter (Signed)
Patient returning call for pulmonary test results.

## 2021-07-11 NOTE — Telephone Encounter (Signed)
-----   Message from [name], MD sent at 07/11/2021  7:40 AM EDT ----- Mild COPD and air trapping- if still having SOB we can refer to pulmonology ----- Message ----- From: Interface, Lab In Three [facility] One Sent: 07/09/2021   1:33 PM EDT To: [name], MD

## 2021-07-11 NOTE — Telephone Encounter (Signed)
Pt continues with SOB, referral to pulmonology placed.

## 2021-08-05 ENCOUNTER — Telehealth: Payer: Self-pay | Admitting: Internal Medicine

## 2021-08-05 MED ORDER — CLOPIDOGREL BISULFATE 75 MG PO TABS: 75.0000 mg | ORAL_TABLET | Freq: Every day | ORAL | 0 refills | Status: DC

## 2021-08-05 NOTE — Telephone Encounter (Signed)
Called pt and informed him that I would send in a 3 day supply of clopidogrel.  This will enable him to make it to end date of 10/1.  Script sent to [facility].

## 2021-08-05 NOTE — Telephone Encounter (Signed)
Pt c/o medication issue:  1. Name of Medication: clopidogrel (PLAVIX) 75 MG tablet  2. How are you currently taking this medication (dosage and times per day)? 1 tablet daily  3. Are you having a reaction (difficulty breathing--STAT)? no  4. What is your medication issue? Patient states he has enough medication for to make it to 9/27 and was told to stop the medication 10/1. He would like to know if he should just stop the medication when he runs out or if he needs a 3 day refill.

## 2021-08-19 ENCOUNTER — Institutional Professional Consult (permissible substitution): Payer: Medicare Other | Admitting: Pulmonary Disease

## 2021-08-20 ENCOUNTER — Encounter: Payer: Self-pay | Admitting: Cardiology

## 2021-08-20 ENCOUNTER — Other Ambulatory Visit: Payer: Self-pay

## 2021-08-20 ENCOUNTER — Encounter: Payer: Medicare Other | Admitting: Cardiology

## 2021-08-20 NOTE — Progress Notes (Signed)
This encounter was created in error - please disregard.

## 2021-08-22 ENCOUNTER — Other Ambulatory Visit: Payer: Self-pay | Admitting: Family Medicine

## 2021-08-22 DIAGNOSIS — Z122 Encounter for screening for malignant neoplasm of respiratory organs: Secondary | ICD-10-CM

## 2021-08-26 ENCOUNTER — Ambulatory Visit (INDEPENDENT_AMBULATORY_CARE_PROVIDER_SITE_OTHER): Payer: Medicare Other | Admitting: Cardiology

## 2021-08-26 ENCOUNTER — Encounter: Payer: Self-pay | Admitting: Cardiology

## 2021-08-26 ENCOUNTER — Other Ambulatory Visit: Payer: Self-pay

## 2021-08-26 VITALS — BP 124/58 | HR 60 | Ht 69.0 in | Wt 232.2 lb

## 2021-08-26 DIAGNOSIS — G4733 Obstructive sleep apnea (adult) (pediatric): Secondary | ICD-10-CM

## 2021-08-26 DIAGNOSIS — I10 Essential (primary) hypertension: Secondary | ICD-10-CM

## 2021-08-26 DIAGNOSIS — Z9989 Dependence on other enabling machines and devices: Secondary | ICD-10-CM | POA: Insufficient documentation

## 2021-08-26 NOTE — Patient Instructions (Signed)

## 2021-08-26 NOTE — Progress Notes (Signed)
Cardiology Office Note:    Date:  08/26/2021   ID:  Austin Oliver, DOB [birth year], MRN [id number]  PCP:  [name], MD  Cardiologist:  [name], MD    Referring MD: [name], *   Chief Complaint  Patient presents with   Sleep Apnea   Hypertension    History of Present Illness:    Austin Oliver is a 65 y.o. male with a hx of coronary artery calcifications, GERD, HTN, bardycardia and SOB who was referred for sleep study by Dr. [name].  He tells me that he had been having problems with excessive daytime sleepiness and is he would sit down he would fall asleep.  He also says that he had severe snoring and would wake himself up if he was sleeping on his back.  Home sleep study showed mild OSA with an AHI of 7.8/hr and no significant nocturnal hypoxemia.  He was started on auto CPAP and is now here for followup.   He is doing well with his CPAP device and thinks that he has gotten used to it.  He tolerates the mask and feels the pressure is adequate.  Since going on CPAP he feels more rested in the am and has no significant daytime sleepiness. He says that he still does not sleep as well as he would like.  He takes melatonin to go to sleep and has to get up to urinate.   He denies any significant mouth or nasal dryness or nasal congestion.  He does not think that he snores.     Past Medical History:  Diagnosis Date   Bradycardia    Coronary artery calcification seen on CAT scan 01/14/2017   Essential hypertension 09/11/2020   Family history of thyroid problem    GERD (gastroesophageal reflux disease)    History of kidney stones    Hyperlipidemia    Hypothyroidism    OSA on CPAP    Mild with AHI 7.8/hr >>on CPAP   Osteoarthritis    Shingles 2012   SOB (shortness of breath) on exertion    Thyroid disease     Past Surgical History:  Procedure Laterality Date   CORONARY STENT INTERVENTION N/A 08/24/2020   Procedure: CORONARY STENT  INTERVENTION;  Surgeon: [name], MD;  Location: [facility];  Service: Cardiovascular;  Laterality: N/A;   HEEL SPUR SURGERY Left 80's   INTRAVASCULAR PRESSURE WIRE/FFR STUDY N/A 04/25/2021   Procedure: INTRAVASCULAR PRESSURE WIRE/FFR STUDY;  Surgeon: [name], MD;  Location: [facility];  Service: Cardiovascular;  Laterality: N/A;   INTRAVASCULAR ULTRASOUND/IVUS N/A 08/24/2020   Procedure: Intravascular Ultrasound/IVUS;  Surgeon: [name], MD;  Location: [facility];  Service: Cardiovascular;  Laterality: N/A;   KNEE SURGERY Bilateral    numerous times   LEFT HEART CATH AND CORONARY ANGIOGRAPHY N/A 08/24/2020   Procedure: LEFT HEART CATH AND CORONARY ANGIOGRAPHY;  Surgeon: [name], MD;  Location: [facility];  Service: Cardiovascular;  Laterality: N/A;   LEFT HEART CATH AND CORONARY ANGIOGRAPHY N/A 04/25/2021   Procedure: LEFT HEART CATH AND CORONARY ANGIOGRAPHY;  Surgeon: [name], MD;  Location: [facility];  Service: Cardiovascular;  Laterality: N/A;   NECK SURGERY  70's   PACEMAKER IMPLANT N/A 12/04/2020   Procedure: PACEMAKER IMPLANT;  Surgeon: [name], MD;  Location: [facility];  Service: Cardiovascular;  Laterality: N/A;   TOTAL HIP ARTHROPLASTY Left 03/09/2017   Procedure:  LEFT TOTAL HIP ARTHROPLASTY ANTERIOR APPROACH;  Surgeon: Rod Can, MD;  Location: [facility];  Service: Orthopedics;  Laterality: Left;  Dr. requesting RNFA    Current Medications: Current Meds  Medication Sig   acetaminophen (TYLENOL) 500 MG tablet Take 1,000 mg by mouth every 6 (six) hours as needed (pain.).   albuterol (VENTOLIN HFA) 108 (90 Base) MCG/ACT inhaler Inhale into the lungs as needed.   aspirin EC 81 MG tablet Take 81 mg by mouth daily at 4 PM. Swallow whole.   cholecalciferol (VITAMIN D3) 25 MCG (1000 UNIT) tablet Take 1,000 Units by mouth in the morning.   ezetimibe (ZETIA) 10 MG tablet Take 10 mg by mouth  in the morning.   finasteride (PROSCAR) 5 MG tablet Take 5 mg by mouth daily.   furosemide (LASIX) 20 MG tablet Take 1 tablet (20 mg total) by mouth daily.   gabapentin (NEURONTIN) 100 MG capsule Take 200 mg by mouth at bedtime.   ibuprofen (ADVIL) 200 MG tablet Take 400 mg by mouth every 8 (eight) hours as needed (inflammation/pain.).   isosorbide mononitrate (IMDUR) 120 MG 24 hr tablet Take 1 tablet (120 mg total) by mouth daily.   ketotifen (ZADITOR) 0.025 % ophthalmic solution Place 1 drop into both eyes 2 (two) times daily as needed (for allergy eyes).   levothyroxine (SYNTHROID) 112 MCG tablet Take 112 mcg by mouth daily before breakfast.   loteprednol (LOTEMAX) 0.5 % ophthalmic suspension Place 1 drop into the left eye 2 (two) times a week. Mondays & Fridays   nitroGLYCERIN (NITROSTAT) 0.4 MG SL tablet Place 1 tablet (0.4 mg total) under the tongue every 5 (five) minutes x 3 doses as needed for chest pain.   pantoprazole (PROTONIX) 40 MG tablet Take 40 mg by mouth daily after supper.    psyllium (METAMUCIL) 58.6 % powder Take 1 packet by mouth 3 (three) times daily after meals.   ranolazine (RANEXA) 1000 MG SR tablet Take 1 tablet (1,000 mg total) by mouth 2 (two) times daily.   rosuvastatin (CRESTOR) 5 MG tablet TAKE 1 TABLET BY MOUTH DAILY   shark liver oil-cocoa butter (PREPARATION H) 0.25-3-85.5 % suppository Place 1 suppository rectally daily as needed for hemorrhoids.   tamsulosin (FLOMAX) 0.4 MG CAPS capsule Take 0.8 mg by mouth daily with supper.   vitamin B-12 (CYANOCOBALAMIN) 1000 MCG tablet Take 1,000 mcg by mouth in the morning.     Allergies:   Bee venom, Statins, and Cleocin [clindamycin hcl]   Social History   Socioeconomic History   Marital status: Married    Spouse name: [name]   Number of children: 2   Years of education: Not on file   Highest education level: Not on file  Occupational History   Occupation: Animator  Tobacco Use   Smoking status:  Former    Types: Cigarettes    Quit date: 12/05/2016    Years since quitting: 4.7   Smokeless tobacco: Never  Substance and Sexual Activity   Alcohol use: No   Drug use: No   Sexual activity: Not on file  Other Topics Concern   Not on file  Social History Narrative   Not on file   Social Determinants of Health   Financial Resource Strain: Not on file  Food Insecurity: Not on file  Transportation Needs: Not on file  Physical Activity: Not on file  Stress: Not on file  Social Connections: Not on file     Family History: The patient's Hefamily history  includes COPD in his father; Dementia in his mother; Heart Problems in his father; Lung cancer in his father.  ROS:   Please see the history of present illness.    ROS  All other systems reviewed and negative.   EKGs/Labs/Other Studies Reviewed:    The following studies were reviewed today: Home sleep study and PAP compliance download  EKG:  EKG is not ordered today.   Recent Labs: 11/30/2020: ALT 20 04/04/2021: NT-Pro BNP 46 04/16/2021: BUN 22; Creatinine, Ser 1.34; Hemoglobin 12.7; Magnesium 1.9; Platelets 120; Potassium 4.6; Sodium 139   Recent Lipid Panel    Component Value Date/Time   CHOL 158 08/22/2020 1044   TRIG 88 08/22/2020 1044   HDL 35 (L) 08/22/2020 1044   CHOLHDL 4.5 08/22/2020 1044   LDLCALC 106 (H) 08/22/2020 1044    CHA2DS2-VASc Score =   [ ] .  Therefore, the patient's annual risk of stroke is   %.        Physical Exam:    VS:  BP (!) 124/58   Pulse 60   Ht 5\' 9"  (1.753 m)   Wt 232 lb 3.2 oz (105.3 kg)   SpO2 98%   BMI 34.29 kg/m     Wt Readings from Last 3 Encounters:  08/26/21 232 lb 3.2 oz (105.3 kg)  08/20/21 230 lb (104.3 kg)  06/18/21 230 lb (104.3 kg)     GEN:  Well nourished, well developed in no acute distress HEENT: Normal NECK: No JVD; No carotid bruits LYMPHATICS: No lymphadenopathy CARDIAC: RRR, no murmurs, rubs, gallops RESPIRATORY:  Clear to auscultation without  rales, wheezing or rhonchi  ABDOMEN: Soft, non-tender, non-distended MUSCULOSKELETAL:  No edema; No deformity  SKIN: Warm and dry NEUROLOGIC:  Alert and oriented x 3 PSYCHIATRIC:  Normal affect   ASSESSMENT:    1. OSA (obstructive sleep apnea)   2. Essential hypertension    PLAN:    In order of problems listed above:  OSA - The patient is tolerating PAP therapy well without any problems. The PAP download performed by his DME was personally reviewed and interpreted by me today and showed an AHI of 0.8/hr on auto CPAP  with 87% compliance in using more than 4 hours nightly.  The patient has been using and benefiting from PAP use and will continue to benefit from therapy.    HTN -BP controlled on exam today -continue on prescription drug management with Imdur 120mg  daily with PRN refills  Time Spent: 20 minutes total time of encounter, including 15 minutes spent in face-to-face patient care on the date of this encounter. This time includes coordination of care and counseling regarding above mentioned problem list. Remainder of non-face-to-face time involved reviewing chart documents/testing relevant to the patient encounter and documentation in the medical record. I have independently reviewed documentation from referring provider  Medication Adjustments/Labs and Tests Ordered: Current medicines are reviewed at length with the patient today.  Concerns regarding medicines are outlined above.  No orders of the defined types were placed in this encounter.  No orders of the defined types were placed in this encounter.   Signed, Fransico Him, MD  08/26/2021 11:12 AM    [place]

## 2021-09-03 ENCOUNTER — Telehealth: Payer: Self-pay | Admitting: Pulmonary Disease

## 2021-09-03 NOTE — Telephone Encounter (Signed)
[facility] faxed labs.  CBC with diff 08/12/21.

## 2021-09-04 ENCOUNTER — Ambulatory Visit (INDEPENDENT_AMBULATORY_CARE_PROVIDER_SITE_OTHER): Payer: Medicare Other

## 2021-09-04 DIAGNOSIS — I495 Sick sinus syndrome: Secondary | ICD-10-CM | POA: Diagnosis not present

## 2021-09-05 LAB — CUP PACEART REMOTE DEVICE CHECK
Battery Remaining Longevity: 104 mo
Battery Remaining Percentage: 95 %
Battery Voltage: 3.01 V
Brady Statistic AP VP Percent: 1 %
Brady Statistic AP VS Percent: 91 %
Brady Statistic AS VP Percent: 1 %
Brady Statistic AS VS Percent: 8.3 %
Brady Statistic RA Percent Paced: 91 %
Brady Statistic RV Percent Paced: 1 %
Date Time Interrogation Session: 20221019020014
Implantable Lead Implant Date: 20220118
Implantable Lead Implant Date: 20220118
Implantable Lead Location: 753859
Implantable Lead Location: 753860
Implantable Pulse Generator Implant Date: 20220118
Lead Channel Impedance Value: 440 Ohm
Lead Channel Impedance Value: 490 Ohm
Lead Channel Pacing Threshold Amplitude: 0.75 V
Lead Channel Pacing Threshold Amplitude: 0.75 V
Lead Channel Pacing Threshold Pulse Width: 0.5 ms
Lead Channel Pacing Threshold Pulse Width: 0.5 ms
Lead Channel Sensing Intrinsic Amplitude: 3.6 mV
Lead Channel Sensing Intrinsic Amplitude: 6.7 mV
Lead Channel Setting Pacing Amplitude: 2 V
Lead Channel Setting Pacing Amplitude: 2.5 V
Lead Channel Setting Pacing Pulse Width: 0.5 ms
Lead Channel Setting Sensing Sensitivity: 2 mV
Pulse Gen Model: 2272
Pulse Gen Serial Number: 3891376

## 2021-09-09 NOTE — Progress Notes (Signed)
Remote pacemaker transmission.   

## 2021-09-12 ENCOUNTER — Other Ambulatory Visit: Payer: Self-pay

## 2021-09-12 ENCOUNTER — Ambulatory Visit (INDEPENDENT_AMBULATORY_CARE_PROVIDER_SITE_OTHER): Payer: Medicare Other | Admitting: Pulmonary Disease

## 2021-09-12 ENCOUNTER — Encounter: Payer: Self-pay | Admitting: Pulmonary Disease

## 2021-09-12 VITALS — BP 102/62 | HR 61 | Temp 97.8°F | Ht 69.0 in | Wt 229.0 lb

## 2021-09-12 DIAGNOSIS — J449 Chronic obstructive pulmonary disease, unspecified: Secondary | ICD-10-CM

## 2021-09-12 DIAGNOSIS — R0602 Shortness of breath: Secondary | ICD-10-CM

## 2021-09-12 MED ORDER — SPIRIVA RESPIMAT 2.5 MCG/ACT IN AERS: 2.0000 | INHALATION_SPRAY | Freq: Every day | RESPIRATORY_TRACT | 3 refills | Status: DC

## 2021-09-12 NOTE — Progress Notes (Signed)
Austin Oliver    [id number]    21-Apr-1956  [facility], Curt Jews, MD  Referring Physician: [name], MD [address] [place],  [place] [address]  Chief complaint:   Patient being seen for concern for obstructive lung disease  HPI:  Was told about COPD following a CT scan a few years back Recent CT scan also did show evidence of emphysema Recently had a pulmonary function test showing some air trapping, normal FEV1 and FVC-mild obstructive disease with no significant bronchodilator response  Shortness of breath on exertion Shortness of breath whenever he bends over History of coronary artery disease s/p stenting in the past  History of obstructive sleep apnea for which he uses CPAP on a regular basis-benefiting from CPAP use  Occasional wheezing  Was prescribed albuterol but only rarely uses it  Reformed smoker quit in 2018 was smoking 1-1/2 to 2 packs a day  Worked with [employer] in the past  Outpatient Encounter Medications as of 09/12/2021  Medication Sig   acetaminophen (TYLENOL) 500 MG tablet Take 1,000 mg by mouth every 6 (six) hours as needed (pain.).   albuterol (VENTOLIN HFA) 108 (90 Base) MCG/ACT inhaler Inhale into the lungs as needed.   aspirin EC 81 MG tablet Take 81 mg by mouth daily at 4 PM. Swallow whole.   ezetimibe (ZETIA) 10 MG tablet Take 10 mg by mouth in the morning.   finasteride (PROSCAR) 5 MG tablet Take 5 mg by mouth daily.   gabapentin (NEURONTIN) 100 MG capsule Take 200 mg by mouth at bedtime.   ibuprofen (ADVIL) 200 MG tablet Take 400 mg by mouth every 8 (eight) hours as needed (inflammation/pain.).   isosorbide mononitrate (IMDUR) 120 MG 24 hr tablet Take 1 tablet (120 mg total) by mouth daily.   ketotifen (ZADITOR) 0.025 % ophthalmic solution Place 1 drop into both eyes 2 (two) times daily as needed (for allergy eyes).   levothyroxine (SYNTHROID) 112 MCG tablet Take 112 mcg by mouth daily  before breakfast.   loteprednol (LOTEMAX) 0.5 % ophthalmic suspension Place 1 drop into the left eye 2 (two) times a week. Mondays & Fridays   nitroGLYCERIN (NITROSTAT) 0.4 MG SL tablet Place 1 tablet (0.4 mg total) under the tongue every 5 (five) minutes x 3 doses as needed for chest pain.   pantoprazole (PROTONIX) 40 MG tablet Take 40 mg by mouth daily after supper.    psyllium (METAMUCIL) 58.6 % powder Take 1 packet by mouth 3 (three) times daily after meals.   ranolazine (RANEXA) 1000 MG SR tablet Take 1 tablet (1,000 mg total) by mouth 2 (two) times daily.   rosuvastatin (CRESTOR) 5 MG tablet TAKE 1 TABLET BY MOUTH DAILY   shark liver oil-cocoa butter (PREPARATION H) 0.25-3-85.5 % suppository Place 1 suppository rectally daily as needed for hemorrhoids.   tamsulosin (FLOMAX) 0.4 MG CAPS capsule Take 0.8 mg by mouth daily with supper.   vitamin B-12 (CYANOCOBALAMIN) 1000 MCG tablet Take 1,000 mcg by mouth in the morning.   [DISCONTINUED] cholecalciferol (VITAMIN D3) 25 MCG (1000 UNIT) tablet Take 1,000 Units by mouth in the morning. (Patient not taking: Reported on 09/12/2021)   [DISCONTINUED] clopidogrel (PLAVIX) 75 MG tablet Take 1 tablet (75 mg total) by mouth daily. (Patient not taking: No sig reported)   [DISCONTINUED] furosemide (LASIX) 20 MG tablet Take 1 tablet (20 mg total) by mouth daily. (Patient not taking: Reported on 09/12/2021)  No facility-administered encounter medications on file as of 09/12/2021.    Allergies as of 09/12/2021 - Review Complete 09/12/2021  Allergen Reaction Noted   Bee venom Swelling 03/03/2017   Cleocin [clindamycin hcl] Rash 02/27/2017   Statins Other (See Comments) 08/24/2020    Past Medical History:  Diagnosis Date   Bradycardia    Coronary artery calcification seen on CAT scan 01/14/2017   Essential hypertension 09/11/2020   Family history of thyroid problem    GERD (gastroesophageal reflux disease)    History of kidney stones     Hyperlipidemia    Hypothyroidism    OSA on CPAP    Mild with AHI 7.8/hr >>on CPAP   Osteoarthritis    Shingles 2012   SOB (shortness of breath) on exertion    Thyroid disease     Past Surgical History:  Procedure Laterality Date   CORONARY STENT INTERVENTION N/A 08/24/2020   Procedure: CORONARY STENT INTERVENTION;  Surgeon: [name], MD;  Location: [facility];  Service: Cardiovascular;  Laterality: N/A;   HEEL SPUR SURGERY Left 80's   INTRAVASCULAR PRESSURE WIRE/FFR STUDY N/A 04/25/2021   Procedure: INTRAVASCULAR PRESSURE WIRE/FFR STUDY;  Surgeon: [name], MD;  Location: [facility];  Service: Cardiovascular;  Laterality: N/A;   INTRAVASCULAR ULTRASOUND/IVUS N/A 08/24/2020   Procedure: Intravascular Ultrasound/IVUS;  Surgeon: [name], MD;  Location: [facility];  Service: Cardiovascular;  Laterality: N/A;   KNEE SURGERY Bilateral    numerous times   LEFT HEART CATH AND CORONARY ANGIOGRAPHY N/A 08/24/2020   Procedure: LEFT HEART CATH AND CORONARY ANGIOGRAPHY;  Surgeon: [name], MD;  Location: [facility];  Service: Cardiovascular;  Laterality: N/A;   LEFT HEART CATH AND CORONARY ANGIOGRAPHY N/A 04/25/2021   Procedure: LEFT HEART CATH AND CORONARY ANGIOGRAPHY;  Surgeon: [name], MD;  Location: [facility];  Service: Cardiovascular;  Laterality: N/A;   NECK SURGERY  70's   PACEMAKER IMPLANT N/A 12/04/2020   Procedure: PACEMAKER IMPLANT;  Surgeon: [name], MD;  Location: [facility];  Service: Cardiovascular;  Laterality: N/A;   TOTAL HIP ARTHROPLASTY Left 03/09/2017   Procedure: LEFT TOTAL HIP ARTHROPLASTY ANTERIOR APPROACH;  Surgeon: Rod Can, MD;  Location: [facility];  Service: Orthopedics;  Laterality: Left;  Dr. requesting RNFA    Family History  Problem Relation Age of Onset   Dementia Mother    Heart Problems Father    COPD Father    Lung cancer Father     Social History    Socioeconomic History   Marital status: Married    Spouse name: [name]   Number of children: 2   Years of education: Not on file   Highest education level: Not on file  Occupational History   Occupation: Animator  Tobacco Use   Smoking status: Former    Types: Cigarettes    Quit date: 12/05/2016    Years since quitting: 4.7   Smokeless tobacco: Never  Substance and Sexual Activity   Alcohol use: No   Drug use: No   Sexual activity: Not on file  Other Topics Concern   Not on file  Social History Narrative   Not on file   Social Determinants of Health   Financial Resource Strain: Not on file  Food Insecurity: Not on file  Transportation Needs: Not on file  Physical Activity: Not on file  Stress: Not on file  Social Connections: Not on file  Intimate Partner Violence:  Not on file    Review of Systems  Respiratory:  Positive for shortness of breath and wheezing.    Vitals:   09/12/21 1514  BP: 102/62  Pulse: 61  Temp: 97.8 F (36.6 C)  SpO2: 99%     Physical Exam Constitutional:      Appearance: He is obese.  HENT:     Head: Normocephalic.     Mouth/Throat:     Mouth: Mucous membranes are moist.  Eyes:     Pupils: Pupils are equal, round, and reactive to light.  Cardiovascular:     Rate and Rhythm: Normal rate and regular rhythm.     Heart sounds: No murmur heard.   No friction rub.  Pulmonary:     Effort: No respiratory distress.     Breath sounds: No stridor. No wheezing or rhonchi.  Musculoskeletal:     Cervical back: No rigidity or tenderness.  Neurological:     Mental Status: He is alert.  Psychiatric:        Mood and Affect: Mood normal.     Data Reviewed: Recent PFT with scooping of the flow volume loop suggestive of an obstructive disease  CT scan of the chest 08/27/2020 with evidence of emphysema  Assessment:  Mild chronic obstructive pulmonary disease  Shortness of breath on exertion  Coronary artery  disease  Obstructive sleep apnea  Dyspnea on exertion   Plan/Recommendations: Spiriva daily  Albuterol use as needed  Continue CPAP  Graded exercises as tolerated  Step up weight loss efforts  Follow up in 3 months   Sherrilyn Rist MD [facility] 09/12/2021, 3:38 PM  CC: [name], *

## 2021-09-12 NOTE — Patient Instructions (Signed)
.    Prescription for inhaler Spiriva sent to pharmacy -2 puffs daily  .  Use your rescue inhaler 2 puffs up to 4 times a day as needed for shortness of breath or wheezing  .  Regular exercises will help  .  Weight loss efforts as tolerated  .  I will see you back in about 3 months  .  Call with significant concerns

## 2021-09-23 NOTE — Progress Notes (Signed)
[facility] OFFICE PROGRESS NOTE  [name], MD [address] [place] [address]  DIAGNOSIS: Intermittent pancytopenia   PRIOR THERAPY: None   CURRENT THERAPY: Observation   INTERVAL HISTORY: Austin Oliver 65 y.o. male returns to the clinic today for a follow-up visit.  The patient was last seen in the clinic on 03/29/2021.  At that point time, the patient had been a new referral for the chief complaint of intermittent pancytopenia for years.  The patient had intermittent periods of mild anemia, thrombocytopenia, and/or leukopenia.  The patient had several lab studies performed including vitamin B12, folate, ANA, ferritin, iron studies, LDH, rheumatoid factor, HIV testing, and hepatitis panel which were all unremarkable. The patient was asymptomatic without bleeding/bruising or frequent infections. He underwent a bone marrow biopsy and aspirate on 03/21/20 which was unremarkable and his marrow was normo cellular with mild erythroid hyperplasia which was favored to be reactive but he had FISH performed to assess for myelodysplasia which was unremarkable. Flow cytology was negative for monoclonal B-cell or phenotypically aberrant T cells.   Due to no clear or correctable etiology of his mild intermittent pancytopenia and the fact he is asymptomatic,no further workup was recommended. Dr. Julien Nordmann recommended that the patient follow with his PCP.   The patient is being considered for a right knee replacement by [facility]. Due to his mild anemia, he was referred back to the clinic for re-evaluation of his anemia prior to undergoing surgery. He had repeat labs performed on 09/11/21 by his PCP which showed mild anemia with a Hbg of 11.4, low WBC of 3.0, and slightly low platelet count of 110. Of note, the patient does have evidence of cirrhosis on imaging of the liver performed on 04/22/21 which can explain the thrombocytopenia.   The patient has been working with his PCP  to discontinue medications to see if that has impacted his intermittent pancytopenia. Thus far, there has been no significant improvement with his blood count. The patient denies any abnormal bleeding or bruising. The patient previously reported he is up to date with his colonoscopy. However, I do not have access to his records.   Overall, the patient is feeling well. He received the 4th COVID-19 booster on 09/13/21. Following this, he had fever and chills and diarrhea that evening and in\to the following day. He was chopping wood the following day and began to feel short of breath and dizzy. He then rested and drank ginger ale until his symptoms improved. He was concerned about this episode and has several questions about the etiology of this episode. He is feeling well at this time.   Otherwise, no other fevers, chills, night sweats, or unexplained weight loss.  He denies any lymphadenopathy.  The patient denies any nausea, vomiting, or constipation. His diarrhea had resolved.   He is here for evaluation and repeat blood work.    MEDICAL HISTORY: Past Medical History:  Diagnosis Date   Bradycardia    Coronary artery calcification seen on CAT scan 01/14/2017   Essential hypertension 09/11/2020   Family history of thyroid problem    GERD (gastroesophageal reflux disease)    History of kidney stones    Hyperlipidemia    Hypothyroidism    OSA on CPAP    Mild with AHI 7.8/hr >>on CPAP   Osteoarthritis    Shingles 2012   SOB (shortness of breath) on exertion    Thyroid disease     ALLERGIES:  is allergic to bee  venom, cleocin [clindamycin hcl], and statins.  MEDICATIONS:  Current Outpatient Medications  Medication Sig Dispense Refill   acetaminophen (TYLENOL) 500 MG tablet Take 1,000 mg by mouth every 6 (six) hours as needed (pain.).     albuterol (VENTOLIN HFA) 108 (90 Base) MCG/ACT inhaler Inhale into the lungs as needed.     aspirin EC 81 MG tablet Take 81 mg by mouth daily at 4 PM.  Swallow whole.     ezetimibe (ZETIA) 10 MG tablet Take 10 mg by mouth in the morning.     finasteride (PROSCAR) 5 MG tablet Take 5 mg by mouth daily.     gabapentin (NEURONTIN) 100 MG capsule Take 200 mg by mouth at bedtime.     ibuprofen (ADVIL) 200 MG tablet Take 400 mg by mouth every 8 (eight) hours as needed (inflammation/pain.).     isosorbide mononitrate (IMDUR) 120 MG 24 hr tablet Take 1 tablet (120 mg total) by mouth daily. 90 tablet 3   ketotifen (ZADITOR) 0.025 % ophthalmic solution Place 1 drop into both eyes 2 (two) times daily as needed (for allergy eyes).     levothyroxine (SYNTHROID) 112 MCG tablet Take 112 mcg by mouth daily before breakfast.     loteprednol (LOTEMAX) 0.5 % ophthalmic suspension Place 1 drop into the left eye 2 (two) times a week. Mondays & Fridays     nitroGLYCERIN (NITROSTAT) 0.4 MG SL tablet Place 1 tablet (0.4 mg total) under the tongue every 5 (five) minutes x 3 doses as needed for chest pain. 25 tablet 4   pantoprazole (PROTONIX) 40 MG tablet Take 40 mg by mouth daily after supper.      psyllium (METAMUCIL) 58.6 % powder Take 1 packet by mouth 3 (three) times daily after meals.     ranolazine (RANEXA) 1000 MG SR tablet Take 1 tablet (1,000 mg total) by mouth 2 (two) times daily. 180 tablet 3   rosuvastatin (CRESTOR) 5 MG tablet TAKE 1 TABLET BY MOUTH DAILY 90 tablet 3   shark liver oil-cocoa butter (PREPARATION H) 0.25-3-85.5 % suppository Place 1 suppository rectally daily as needed for hemorrhoids.     tamsulosin (FLOMAX) 0.4 MG CAPS capsule Take 0.8 mg by mouth daily with supper.     Tiotropium Bromide Monohydrate (SPIRIVA RESPIMAT) 2.5 MCG/ACT AERS Inhale 2 puffs into the lungs daily. 4 g 3   vitamin B-12 (CYANOCOBALAMIN) 1000 MCG tablet Take 1,000 mcg by mouth in the morning.     No current facility-administered medications for this visit.    SURGICAL HISTORY:  Past Surgical History:  Procedure Laterality Date   CORONARY STENT INTERVENTION N/A  08/24/2020   Procedure: CORONARY STENT INTERVENTION;  Surgeon: [name], MD;  Location: [facility];  Service: Cardiovascular;  Laterality: N/A;   HEEL SPUR SURGERY Left 80's   INTRAVASCULAR PRESSURE WIRE/FFR STUDY N/A 04/25/2021   Procedure: INTRAVASCULAR PRESSURE WIRE/FFR STUDY;  Surgeon: [name], MD;  Location: [facility];  Service: Cardiovascular;  Laterality: N/A;   INTRAVASCULAR ULTRASOUND/IVUS N/A 08/24/2020   Procedure: Intravascular Ultrasound/IVUS;  Surgeon: [name], MD;  Location: [facility];  Service: Cardiovascular;  Laterality: N/A;   KNEE SURGERY Bilateral    numerous times   LEFT HEART CATH AND CORONARY ANGIOGRAPHY N/A 08/24/2020   Procedure: LEFT HEART CATH AND CORONARY ANGIOGRAPHY;  Surgeon: [name], MD;  Location: [facility];  Service: Cardiovascular;  Laterality: N/A;   LEFT HEART CATH AND CORONARY ANGIOGRAPHY N/A 04/25/2021  Procedure: LEFT HEART CATH AND CORONARY ANGIOGRAPHY;  Surgeon: [name], MD;  Location: [facility];  Service: Cardiovascular;  Laterality: N/A;   NECK SURGERY  70's   PACEMAKER IMPLANT N/A 12/04/2020   Procedure: PACEMAKER IMPLANT;  Surgeon: [name], MD;  Location: [facility];  Service: Cardiovascular;  Laterality: N/A;   TOTAL HIP ARTHROPLASTY Left 03/09/2017   Procedure: LEFT TOTAL HIP ARTHROPLASTY ANTERIOR APPROACH;  Surgeon: Rod Can, MD;  Location: [facility];  Service: Orthopedics;  Laterality: Left;  Dr. requesting RNFA    REVIEW OF SYSTEMS:   Review of Systems  Constitutional: Negative for appetite change, chills, fatigue, fever and unexpected weight change.  HENT:   Negative for mouth sores, nosebleeds, sore throat and trouble swallowing.   Eyes: Negative for eye problems and icterus.  Respiratory: Negative for cough, hemoptysis, shortness of breath and wheezing.   Cardiovascular: Negative for chest pain and leg swelling.  Gastrointestinal:  Negative for abdominal pain, constipation, diarrhea, nausea and vomiting.  Genitourinary: Negative for bladder incontinence, difficulty urinating, dysuria, frequency and hematuria.   Musculoskeletal: Negative for back pain, gait problem, neck pain and neck stiffness.  Skin: Negative for itching and rash.  Neurological: Negative for dizziness, extremity weakness, gait problem, headaches, light-headedness and seizures.  Hematological: Negative for adenopathy. Does not bruise/bleed easily.  Psychiatric/Behavioral: Negative for confusion, depression and sleep disturbance. The patient is not nervous/anxious.     PHYSICAL EXAMINATION:  Blood pressure 115/62, pulse 85, temperature 97.7 F (36.5 C), resp. rate 20, weight 231 lb 4.8 oz (104.9 kg), SpO2 99 %.  ECOG PERFORMANCE STATUS: 0  Physical Exam  Constitutional: Oriented to person, place, and time and well-developed, well-nourished, and in no distress.  HENT:  Head: Normocephalic and atraumatic.  Mouth/Throat: Oropharynx is clear and moist. No oropharyngeal exudate.  Eyes: Conjunctivae are normal. Right eye exhibits no discharge. Left eye exhibits no discharge. No scleral icterus.  Neck: Normal range of motion. Neck supple.  Cardiovascular: Normal rate, regular rhythm, normal heart sounds and intact distal pulses.   Pulmonary/Chest: Effort normal and breath sounds normal. No respiratory distress. No wheezes. No rales.  Abdominal: Soft. Bowel sounds are normal. Exhibits no distension and no mass. There is no tenderness.  Musculoskeletal: Normal range of motion. Exhibits no edema.  Lymphadenopathy:    No cervical adenopathy.  Neurological: Alert and oriented to person, place, and time. Exhibits normal muscle tone. Gait normal. Coordination normal.  Skin: Skin is warm and dry. No rash noted. Not diaphoretic. No erythema. No pallor.  Psychiatric: Mood, memory and judgment normal.  Vitals reviewed.  LABORATORY DATA: Lab Results  Component  Value Date   WBC 2.6 (L) 09/25/2021   HGB 11.1 (L) 09/25/2021   HCT 32.6 (L) 09/25/2021   MCV 96.4 09/25/2021   PLT 93 (L) 09/25/2021      Chemistry      Component Value Date/Time   NA 139 09/25/2021 1412   NA 139 04/16/2021 1122   K 4.5 09/25/2021 1412   CL 107 09/25/2021 1412   CO2 25 09/25/2021 1412   BUN 17 09/25/2021 1412   BUN 22 04/16/2021 1122   CREATININE 1.52 (H) 09/25/2021 1412      Component Value Date/Time   CALCIUM 8.5 (L) 09/25/2021 1412   ALKPHOS 54 09/25/2021 1412   AST 14 (L) 09/25/2021 1412   ALT 12 09/25/2021 1412   BILITOT 2.2 (H) 09/25/2021 1412       RADIOGRAPHIC STUDIES:  CUP PACEART  REMOTE DEVICE CHECK  Result Date: 09/05/2021 Scheduled remote reviewed. Normal device function.  Next remote 91 days. LR    ASSESSMENT/PLAN:  This is a very pleasant 65 year old Caucasian male referred to the clinic for evaluation of intermittent pancytopenia.   The patient was seen with Dr. Julien Nordmann today.  The patient previously had a bone marrow biopsy and aspirate performed to further evaluate his condition.  The bone marrow biopsy and aspirate did not show any evidence of any bone marrow abnormality. No monoclonal B-cell or phenotypically aberrant T-cell population was identified.  No clear etiology of his intermittent pancytopenia. Dr. Julien Nordmann feels his anemia may be due to  his mild chronic kidney disease. His mild thrombocytopenia may be from cirrhosis in which there is no specific treatment required. We did arrange for repeat CBC, CMP, B12, folate, and iron studies today. His iron is normal. His CBC from today shows mild anemia with a Hbg of 11.1. His platelet count is mildly low 93k. His WBC is 2.6. His ANC is 1.6. His other lab studies are pending. The patient is asymptomatic and denies any frequent infections, bleeding abnormalities, or symptomatic anemia. The patient has tried to discontinue medications with no improvement in his blood counts. No alcohol  abuse.    Dr. Julien Nordmann recommends that the patient continue on observation. From our standpoint, the patient may proceed with surgery. There is no clear correctable cause of his anemia such as vitamin deficiency. The decision to proceed/parameters for surgery with his blood counts we will defer to his surgery team. He does not qualify for a blood or platelet transfusion at this time but of course can always receive transfusion support if needed following surgery. His anemia is mild at this time.    No follow up visit needed at this time but we would be happy to see the patient on an as needed basis.   The patient was advised to call immediately if he has any concerning symptoms in the interval. The patient voices understanding of current disease status and treatment options and is in agreement with the current care plan. All questions were answered. The patient knows to call the clinic with any problems, questions or concerns. We can certainly see the patient much sooner if necessary      No orders of the defined types were placed in this encounter.     Kenya Shiraishi L Pearlie Nies, PA-C 09/25/21  ADDENDUM: Hematology/Oncology Attending: I had a face-to-face encounter with the patient today.  I reviewed his record, lab and recommended his care plan.  This is a very pleasant 65 years old white male with history of mild pancytopenia likely reactive in nature.  The patient was seen initially in May 2022 for his condition and he had several studies including a bone marrow biopsy and aspirate that were unremarkable for any abnormality and were consistent with reactive condition.  He was seen recently by his primary care physician for clearance of right knee replacement and was found to have persistent mild anemia.  He was referred to me today for reevaluation of his condition. The patient had repeat CBC today that showed persistent mild anemia, leukocytopenia and thrombocytopenia consistent with his  previous blood work.  I think his hemoglobin and hematocrit as well as platelets count are reasonable to proceed with surgery and the patient will not need any additional treatment at this point.  His anemia is partially to chronic kidney disease and in the future he may benefit from treatment with ESA's  to stimulate his erythropoiesis.  If the patient has any complication from his surgery and there is drop in his hemoglobin and hematocrit he may benefit from blood transfusion as a supportive measures at that time. I will continue to see the patient on as-needed basis at this point and we will be happy to see him in the future if there is any concerning findings. The total time spent in the appointment was 20 minutes. Disclaimer: This note was dictated with voice recognition software. Similar sounding words can inadvertently be transcribed and may be missed upon review. Eilleen Kempf, MD 09/25/21

## 2021-09-25 ENCOUNTER — Telehealth: Payer: Self-pay | Admitting: Physician Assistant

## 2021-09-25 ENCOUNTER — Inpatient Hospital Stay: Payer: Medicare Other | Attending: Physician Assistant | Admitting: Physician Assistant

## 2021-09-25 ENCOUNTER — Other Ambulatory Visit: Payer: Self-pay

## 2021-09-25 ENCOUNTER — Inpatient Hospital Stay: Payer: Medicare Other

## 2021-09-25 ENCOUNTER — Other Ambulatory Visit: Payer: Self-pay | Admitting: Physician Assistant

## 2021-09-25 VITALS — BP 115/62 | HR 85 | Temp 97.7°F | Resp 20 | Wt 231.3 lb

## 2021-09-25 DIAGNOSIS — N182 Chronic kidney disease, stage 2 (mild): Secondary | ICD-10-CM | POA: Diagnosis not present

## 2021-09-25 DIAGNOSIS — D649 Anemia, unspecified: Secondary | ICD-10-CM | POA: Insufficient documentation

## 2021-09-25 DIAGNOSIS — I129 Hypertensive chronic kidney disease with stage 1 through stage 4 chronic kidney disease, or unspecified chronic kidney disease: Secondary | ICD-10-CM | POA: Insufficient documentation

## 2021-09-25 DIAGNOSIS — E039 Hypothyroidism, unspecified: Secondary | ICD-10-CM | POA: Insufficient documentation

## 2021-09-25 DIAGNOSIS — D519 Vitamin B12 deficiency anemia, unspecified: Secondary | ICD-10-CM

## 2021-09-25 DIAGNOSIS — D61818 Other pancytopenia: Secondary | ICD-10-CM | POA: Diagnosis present

## 2021-09-25 LAB — CMP (CANCER CENTER ONLY)
ALT: 12 U/L (ref 0–44)
AST: 14 U/L — ABNORMAL LOW (ref 15–41)
Albumin: 3.7 g/dL (ref 3.5–5.0)
Alkaline Phosphatase: 54 U/L (ref 38–126)
Anion gap: 7 (ref 5–15)
BUN: 17 mg/dL (ref 8–23)
CO2: 25 mmol/L (ref 22–32)
Calcium: 8.5 mg/dL — ABNORMAL LOW (ref 8.9–10.3)
Chloride: 107 mmol/L (ref 98–111)
Creatinine: 1.52 mg/dL — ABNORMAL HIGH (ref 0.61–1.24)
GFR, Estimated: 50 mL/min — ABNORMAL LOW (ref >60)
Glucose, Bld: 215 mg/dL — ABNORMAL HIGH (ref 70–99)
Potassium: 4.5 mmol/L (ref 3.5–5.1)
Sodium: 139 mmol/L (ref 135–145)
Total Bilirubin: 2.2 mg/dL — ABNORMAL HIGH (ref 0.3–1.2)
Total Protein: 6.6 g/dL (ref 6.5–8.1)

## 2021-09-25 LAB — CBC WITH DIFFERENTIAL (CANCER CENTER ONLY)
Abs Immature Granulocytes: 0.01 10*3/uL (ref 0.00–0.07)
Basophils Absolute: 0 10*3/uL (ref 0.0–0.1)
Basophils Relative: 1 %
Eosinophils Absolute: 0.1 10*3/uL (ref 0.0–0.5)
Eosinophils Relative: 2 %
HCT: 32.6 % — ABNORMAL LOW (ref 39.0–52.0)
Hemoglobin: 11.1 g/dL — ABNORMAL LOW (ref 13.0–17.0)
Immature Granulocytes: 0 %
Lymphocytes Relative: 29 %
Lymphs Abs: 0.8 10*3/uL (ref 0.7–4.0)
MCH: 32.8 pg (ref 26.0–34.0)
MCHC: 34 g/dL (ref 30.0–36.0)
MCV: 96.4 fL (ref 80.0–100.0)
Monocytes Absolute: 0.2 10*3/uL (ref 0.1–1.0)
Monocytes Relative: 7 %
Neutro Abs: 1.6 10*3/uL — ABNORMAL LOW (ref 1.7–7.7)
Neutrophils Relative %: 61 %
Platelet Count: 93 10*3/uL — ABNORMAL LOW (ref 150–400)
RBC: 3.38 MIL/uL — ABNORMAL LOW (ref 4.22–5.81)
RDW: 13.8 % (ref 11.5–15.5)
WBC Count: 2.6 10*3/uL — ABNORMAL LOW (ref 4.0–10.5)
nRBC: 0 % (ref 0.0–0.2)

## 2021-09-25 LAB — FOLATE: Folate: 8.2 ng/mL (ref >5.9)

## 2021-09-25 LAB — IRON AND TIBC
Iron: 88 ug/dL (ref 42–163)
Saturation Ratios: 30 % (ref 20–55)
TIBC: 295 ug/dL (ref 202–409)
UIBC: 207 ug/dL (ref 117–376)

## 2021-09-25 LAB — VITAMIN B12: Vitamin B-12: 528 pg/mL (ref 180–914)

## 2021-09-25 LAB — FERRITIN: Ferritin: 171 ng/mL (ref 24–336)

## 2021-09-25 NOTE — Telephone Encounter (Signed)
Scheduled appt per sch msg. Called and left msg.  °

## 2021-10-08 ENCOUNTER — Ambulatory Visit
Admission: RE | Admit: 2021-10-08 | Discharge: 2021-10-08 | Disposition: A | Discharge status: Home / Self Care | Payer: Medicare Other | Source: Ambulatory Visit | Attending: Family Medicine | Admitting: Family Medicine

## 2021-10-08 ENCOUNTER — Other Ambulatory Visit: Payer: Self-pay

## 2021-10-08 DIAGNOSIS — Z122 Encounter for screening for malignant neoplasm of respiratory organs: Secondary | ICD-10-CM

## 2021-10-09 ENCOUNTER — Other Ambulatory Visit: Payer: Self-pay | Admitting: Internal Medicine

## 2021-10-14 ENCOUNTER — Ambulatory Visit: Payer: Self-pay | Admitting: Student

## 2021-10-14 DIAGNOSIS — Z01818 Encounter for other preprocedural examination: Secondary | ICD-10-CM

## 2021-10-21 ENCOUNTER — Other Ambulatory Visit: Payer: Self-pay | Admitting: Internal Medicine

## 2021-10-21 ENCOUNTER — Encounter: Payer: Self-pay | Admitting: Cardiology

## 2021-10-21 ENCOUNTER — Ambulatory Visit: Payer: Self-pay | Admitting: Student

## 2021-10-21 NOTE — H&P (View-Only) (Signed)
TOTAL KNEE ADMISSION H&P  Patient is being admitted for right total knee arthroplasty.  Subjective:  Chief Complaint:right knee pain.  HPI: Austin Oliver, 65 y.o. male, has a history of pain and functional disability in the right knee due to arthritis and has failed non-surgical conservative treatments for greater than 12 weeks to includeNSAID's and/or analgesics and activity modification.  Onset of symptoms was gradual, starting 4 years ago with gradually worsening course since that time. The patient noted prior procedures on the knee to include  ligament reconstruction  on the right knee(s).  Patient currently rates pain in the right knee(s) at 8 out of 10 with activity. Patient has worsening of pain with activity and weight bearing, pain that interferes with activities of daily living, and pain with passive range of motion.  Patient has evidence of subchondral cysts, subchondral sclerosis, and joint space narrowing by imaging studies. There is no active infection.  Patient Active Problem List   Diagnosis Date Noted   OSA on CPAP 08/26/2021   DOE (dyspnea on exertion) 04/04/2021   Anemia due to vitamin B12 deficiency 04/04/2021   Statin myopathy 09/18/2020   Coronary artery disease involving native coronary artery of native heart without angina pectoris 09/11/2020   Essential hypertension 09/11/2020   Hyperlipidemia 09/11/2020   Morbid obesity ([facility]) 09/11/2020   Unstable angina ([facility]) 08/24/2020   Aortic atherosclerosis ([place]) 07/31/2020   Other pancytopenia ([place]) 03/29/2020   Bicytopenia 03/08/2020   Avascular necrosis of hip, left ([place]) 03/09/2017   Coronary artery disease with stable angina pectoris ([place]) 01/14/2017   Past Medical History:  Diagnosis Date   Bradycardia    Coronary artery calcification seen on CAT scan 01/14/2017   Essential hypertension 09/11/2020   Family history of thyroid problem    GERD (gastroesophageal reflux disease)    History of kidney stones     Hyperlipidemia    Hypothyroidism    OSA on CPAP    Mild with AHI 7.8/hr >>on CPAP   Osteoarthritis    Shingles 2012   SOB (shortness of breath) on exertion    Thyroid disease     Past Surgical History:  Procedure Laterality Date   CORONARY STENT INTERVENTION N/A 08/24/2020   Procedure: CORONARY STENT INTERVENTION;  Surgeon: [name], MD;  Location: [facility];  Service: Cardiovascular;  Laterality: N/A;   HEEL SPUR SURGERY Left 80's   INTRAVASCULAR PRESSURE WIRE/FFR STUDY N/A 04/25/2021   Procedure: INTRAVASCULAR PRESSURE WIRE/FFR STUDY;  Surgeon: [name], MD;  Location: [facility];  Service: Cardiovascular;  Laterality: N/A;   INTRAVASCULAR ULTRASOUND/IVUS N/A 08/24/2020   Procedure: Intravascular Ultrasound/IVUS;  Surgeon: [name], MD;  Location: [facility];  Service: Cardiovascular;  Laterality: N/A;   KNEE SURGERY Bilateral    numerous times   LEFT HEART CATH AND CORONARY ANGIOGRAPHY N/A 08/24/2020   Procedure: LEFT HEART CATH AND CORONARY ANGIOGRAPHY;  Surgeon: [name], MD;  Location: [facility];  Service: Cardiovascular;  Laterality: N/A;   LEFT HEART CATH AND CORONARY ANGIOGRAPHY N/A 04/25/2021   Procedure: LEFT HEART CATH AND CORONARY ANGIOGRAPHY;  Surgeon: [name], MD;  Location: [facility];  Service: Cardiovascular;  Laterality: N/A;   NECK SURGERY  70's   PACEMAKER IMPLANT N/A 12/04/2020   Procedure: PACEMAKER IMPLANT;  Surgeon: [name], MD;  Location: [facility];  Service: Cardiovascular;  Laterality: N/A;   TOTAL HIP ARTHROPLASTY Left 03/09/2017   Procedure: LEFT TOTAL HIP  ARTHROPLASTY ANTERIOR APPROACH;  Surgeon: Rod Can, MD;  Location: [facility];  Service: Orthopedics;  Laterality: Left;  Dr. requesting RNFA    Current Outpatient Medications  Medication Sig Dispense Refill Last Dose   acetaminophen (TYLENOL) 500 MG tablet Take 1,000 mg by mouth every 6 (six) hours as  needed (pain.).      albuterol (VENTOLIN HFA) 108 (90 Base) MCG/ACT inhaler Inhale 2 puffs into the lungs every 6 (six) hours as needed for shortness of breath or wheezing.      aspirin EC 81 MG tablet Take 81 mg by mouth daily at 4 PM. Swallow whole.      ezetimibe (ZETIA) 10 MG tablet Take 10 mg by mouth in the morning.      finasteride (PROSCAR) 5 MG tablet Take 5 mg by mouth daily.      gabapentin (NEURONTIN) 100 MG capsule Take 200 mg by mouth at bedtime.      ibuprofen (ADVIL) 200 MG tablet Take 400 mg by mouth every 8 (eight) hours as needed (inflammation/pain.).      isosorbide mononitrate (IMDUR) 120 MG 24 hr tablet TAKE 1 TABLET BY MOUTH DAILY 90 tablet 2    ketotifen (ALAWAY) 0.025 % ophthalmic solution Place 1 drop into both eyes 2 (two) times daily as needed (allergies).      levothyroxine (SYNTHROID) 112 MCG tablet Take 112 mcg by mouth daily before breakfast.      loteprednol (LOTEMAX) 0.5 % ophthalmic suspension Place 1 drop into the left eye 2 (two) times a week. Mondays & Fridays      nitroGLYCERIN (NITROSTAT) 0.4 MG SL tablet Place 1 tablet (0.4 mg total) under the tongue every 5 (five) minutes x 3 doses as needed for chest pain. 25 tablet 4    pantoprazole (PROTONIX) 40 MG tablet Take 40 mg by mouth daily after supper.       ranolazine (RANEXA) 1000 MG SR tablet TAKE 1  BY MOUTH TWICE DAILY 180 tablet 2    rosuvastatin (CRESTOR) 5 MG tablet TAKE 1 TABLET BY MOUTH DAILY 90 tablet 3    shark liver oil-cocoa butter (PREPARATION H) 0.25-3-85.5 % suppository Place 1 suppository rectally daily as needed for hemorrhoids.      tamsulosin (FLOMAX) 0.4 MG CAPS capsule Take 0.8 mg by mouth daily with supper.      Tiotropium Bromide Monohydrate (SPIRIVA RESPIMAT) 2.5 MCG/ACT AERS Inhale 2 puffs into the lungs daily. 4 g 3    vitamin B-12 (CYANOCOBALAMIN) 1000 MCG tablet Take 1,000 mcg by mouth in the morning.      No current facility-administered medications for this visit.   Allergies   Allergen Reactions   Bee Venom Swelling    Extreme Swelling of site     Cleocin [Clindamycin Hcl] Rash    RASH IN BETWEEN FINGERS   Statins Other (See Comments)    Muscles aches with several statins    Social History   Tobacco Use   Smoking status: Former    Types: Cigarettes    Quit date: 12/05/2016    Years since quitting: 4.8   Smokeless tobacco: Never  Substance Use Topics   Alcohol use: No    Family History  Problem Relation Age of Onset   Dementia Mother    Heart Problems Father    COPD Father    Lung cancer Father      Review of Systems  Musculoskeletal:  Positive for arthralgias.  All other systems reviewed and are negative.  Objective:  Physical Exam HENT:     Head: Normocephalic.  Eyes:     Pupils: Pupils are equal, round, and reactive to light.  Cardiovascular:     Rate and Rhythm: Normal rate.     Pulses: Normal pulses.  Pulmonary:     Effort: Pulmonary effort is normal.  Abdominal:     Palpations: Abdomen is soft.  Genitourinary:    Comments: Deferred Musculoskeletal:        General: Tenderness present.     Cervical back: Normal range of motion.  Neurological:     Mental Status: He is alert and oriented to person, place, and time.  Psychiatric:        Behavior: Behavior normal.    Vital signs in last 24 hours: @VSRANGES @  Labs:   Estimated body mass index is 34.16 kg/m as calculated from the following:   Height as of 09/12/21: 5\' 9"  (1.753 m).   Weight as of 09/25/21: 104.9 kg.   Imaging Review Plain radiographs demonstrate severe degenerative joint disease of the right knee(s). The bone quality appears to be adequate for age and reported activity level.      Assessment/Plan:  End stage arthritis, right knee   The patient history, physical examination, clinical judgment of the provider and imaging studies are consistent with end stage degenerative joint disease of the right knee(s) and total knee arthroplasty is deemed  medically necessary. The treatment options including medical management, injection therapy arthroscopy and arthroplasty were discussed at length. The risks and benefits of total knee arthroplasty were presented and reviewed. The risks due to aseptic loosening, infection, stiffness, patella tracking problems, thromboembolic complications and other imponderables were discussed. The patient acknowledged the explanation, agreed to proceed with the plan and consent was signed. Patient is being admitted for inpatient treatment for surgery, pain control, PT, OT, prophylactic antibiotics, VTE prophylaxis, progressive ambulation and ADL's and discharge planning. The patient is planning to be discharged  home after overnight observation     Patient's anticipated LOS is less than 2 midnights, meeting these requirements: - Lives within 1 hour of care - Has a competent adult at home to recover with post-op recover - NO history of  - Chronic pain requiring opiods  - Diabetes  - Coronary Artery Disease  - Heart failure  - Heart attack  - Stroke  - DVT/VTE  - Cardiac arrhythmia  - Respiratory Failure/COPD  - Renal failure  - Anemia  - Advanced Liver disease

## 2021-10-21 NOTE — Patient Instructions (Addendum)
DUE TO COVID-19 ONLY ONE VISITOR IS ALLOWED TO COME WITH YOU AND STAY IN THE WAITING ROOM ONLY DURING PRE OP AND PROCEDURE DAY OF SURGERY IF YOU ARE GOING HOME AFTER SURGERY. IF YOU ARE SPENDING THE NIGHT 2 PEOPLE MAY VISIT WITH YOU IN YOUR PRIVATE ROOM AFTER SURGERY UNTIL VISITING  HOURS ARE OVER AT 800 PM AND 1  VISITOR  MAY  SPEND THE NIGHT.   YOU NEED TO HAVE A COVID 19 TEST ON__12/13__THIS TEST MUST BE DONE BEFORE SURGERY,  COVID TESTING SITE  IS LOCATED AT [place], [place]. REMAIN IN YOUR CAR THIS IS A DRIVE UP TEST. AFTER YOUR COVID TEST PLEASE WEAR A MASK OUT IN PUBLIC AND SOCIAL DISTANCE AND [place] YOUR HANDS FREQUENTLY, ALSO ASK ALL YOUR CLOSE CONTACT PERSONS TO WEAR A MASK AND SOCIAL DISTANCE AND [place] THEIR HANDS FREQUENTLY ALSO.               Austin Oliver     Your procedure is scheduled on: 10/31/21   Report to [facility] Main  Entrance   Report to admitting at  10:40 AM     Call this number if you have problems the morning of surgery [phone]    No food after midnight.    You may have clear liquid until 10:00AM.    At 9:30 AM drink pre surgery drink.   Nothing by mouth after 10:00 AM.   CLEAR LIQUID DIET   Foods Allowed                                                                     Foods Excluded  Coffee and tea, regular and decaf                             liquids that you cannot  Plain Jell-O any favor except red or purple                                           see through such as: Fruit ices (not with fruit pulp)                                     milk, soups, orange juice  Iced Popsicles                                    All solid food Carbonated beverages, regular and diet                                    Cranberry, grape and apple juices Sports drinks like Gatorade Lightly seasoned clear broth or consume(fat free) Sugar   BRUSH YOUR TEETH MORNING OF SURGERY AND RINSE YOUR MOUTH OUT, NO CHEWING GUM CANDY OR  MINTS.      Take these medicines the morning of surgery with A SIP  OF WATER: Isosorbide, Ranolazine, Tamsulosin, Levothyroxine,Pantoprazole.   Use your inhalers and bring them with you.   Bring your mask and tubing to the hospital  Stop taking __ASA 81 mg_________on ___12/6_______as instructed by __[place]___________.  Contact your Surgeon/Cardiologist for instructions on Anticoagulant Therapy prior to surgery.                                  You may not have any metal on your body including             piercings  Do not wear jewelry, lotions, powders or  deodorant             Men may shave face and neck.   Do not bring valuables to the hospital. [place].  Contacts, dentures or bridgework may not be worn into surgery.  Leave suitcase in the car. After surgery it may be brought to your room.                 [place] - Preparing for Surgery Before surgery, you can play an important role.  Because skin is not sterile, your skin needs to be as free of germs as possible.  You can reduce the number of germs on your skin by washing with CHG (chlorahexidine gluconate) soap before surgery.  CHG is an antiseptic cleaner which kills germs and bonds with the skin to continue killing germs even after washing. Please DO NOT use if you have an allergy to CHG or antibacterial soaps.  If your skin becomes reddened/irritated stop using the CHG and inform your nurse when you arrive at Short Stay.  You may shave your face/neck. Please follow these instructions carefully:  1.  Shower with CHG Soap the night before surgery and the  morning of Surgery.  2.  If you choose to wash your hair, wash your hair first as usual with your  normal  shampoo.  3.  After you shampoo, rinse your hair and body thoroughly to remove the  shampoo.                       pcr     4.  Use CHG as you would any other liquid soap.  You can apply chg directly  to the skin  and wash                       Gently with a scrungie or clean washcloth.  5.  Apply the CHG Soap to your body ONLY FROM THE NECK DOWN.   Do not use on face/ open                           Wound or open sores. Avoid contact with eyes, ears mouth and genitals (private parts).                       Wash face,  Genitals (private parts) with your normal soap.             6.  Wash thoroughly, paying special attention to the area where your surgery  will be performed.  7.  Thoroughly rinse your body with warm water from the neck down.  8.  DO NOT  shower/wash with your normal soap after using and rinsing off  the CHG Soap.                9.  Pat yourself dry with a clean towel.            10.  Wear clean pajamas.            11.  Place clean sheets on your bed the night of your first shower and do not  sleep with pets. Day of Surgery : Do not apply any lotions/deodorants the morning of surgery.  Please wear clean clothes to the hospital/surgery center.  FAILURE TO FOLLOW THESE INSTRUCTIONS MAY RESULT IN THE CANCELLATION OF YOUR SURGERY PATIENT SIGNATURE_________________________________  NURSE SIGNATURE__________________________________  ________________________________________________________________________   Adam Phenix  An incentive spirometer is a tool that can help keep your lungs clear and active. This tool measures how well you are filling your lungs with each breath. Taking long deep breaths may help reverse or decrease the chance of developing breathing (pulmonary) problems (especially infection) following: A long period of time when you are unable to move or be active. BEFORE THE PROCEDURE  If the spirometer includes an indicator to show your best effort, your nurse or respiratory therapist will set it to a desired goal. If possible, sit up straight or lean slightly forward. Try not to slouch. Hold the incentive spirometer in an upright position. INSTRUCTIONS FOR USE  Sit on  the edge of your bed if possible, or sit up as far as you can in bed or on a chair. Hold the incentive spirometer in an upright position. Breathe out normally. Place the mouthpiece in your mouth and seal your lips tightly around it. Breathe in slowly and as deeply as possible, raising the piston or the ball toward the top of the column. Hold your breath for 3-5 seconds or for as long as possible. Allow the piston or ball to fall to the bottom of the column. Remove the mouthpiece from your mouth and breathe out normally. Rest for a few seconds and repeat Steps 1 through 7 at least 10 times every 1-2 hours when you are awake. Take your time and take a few normal breaths between deep breaths. The spirometer may include an indicator to show your best effort. Use the indicator as a goal to work toward during each repetition. After each set of 10 deep breaths, practice coughing to be sure your lungs are clear. If you have an incision (the cut made at the time of surgery), support your incision when coughing by placing a pillow or rolled up towels firmly against it. Once you are able to get out of bed, walk around indoors and cough well. You may stop using the incentive spirometer when instructed by your caregiver.  RISKS AND COMPLICATIONS Take your time so you do not get dizzy or light-headed. If you are in pain, you may need to take or ask for pain medication before doing incentive spirometry. It is harder to take a deep breath if you are having pain. AFTER USE Rest and breathe slowly and easily. It can be helpful to keep track of a log of your progress. Your caregiver can provide you with a simple table to help with this. If you are using the spirometer at home, follow these instructions: [place] IF:  You are having difficultly using the spirometer. You have trouble using the spirometer as often as instructed. Your pain medication is not giving enough  relief while using the  spirometer. You develop fever of 100.5 F (38.1 C) or higher. SEEK IMMEDIATE MEDICAL CARE IF:  You cough up bloody sputum that had not been present before. You develop fever of 102 F (38.9 C) or greater. You develop worsening pain at or near the incision site. MAKE SURE YOU:  Understand these instructions. Will watch your condition. Will get help right away if you are not doing well or get worse. Document Released: 03/16/2007 Document Revised: 01/26/2012 Document Reviewed: 05/17/2007 [facility] Patient Information 2014 [place], [place].   ________________________________________________________________________

## 2021-10-21 NOTE — H&P (Signed)
TOTAL KNEE ADMISSION H&P  Patient is being admitted for right total knee arthroplasty.  Subjective:  Chief Complaint:right knee pain.  HPI: SHOAIB SIEFKER, 65 y.o. male, has a history of pain and functional disability in the right knee due to arthritis and has failed non-surgical conservative treatments for greater than 12 weeks to includeNSAID's and/or analgesics and activity modification.  Onset of symptoms was gradual, starting 4 years ago with gradually worsening course since that time. The patient noted prior procedures on the knee to include  ligament reconstruction  on the right knee(s).  Patient currently rates pain in the right knee(s) at 8 out of 10 with activity. Patient has worsening of pain with activity and weight bearing, pain that interferes with activities of daily living, and pain with passive range of motion.  Patient has evidence of subchondral cysts, subchondral sclerosis, and joint space narrowing by imaging studies. There is no active infection.  Patient Active Problem List   Diagnosis Date Noted   OSA on CPAP 08/26/2021   DOE (dyspnea on exertion) 04/04/2021   Anemia due to vitamin B12 deficiency 04/04/2021   Statin myopathy 09/18/2020   Coronary artery disease involving native coronary artery of native heart without angina pectoris 09/11/2020   Essential hypertension 09/11/2020   Hyperlipidemia 09/11/2020   Morbid obesity ([place]) 09/11/2020   Unstable angina ([place]) 08/24/2020   Aortic atherosclerosis ([place]) 07/31/2020   Other pancytopenia ([place]) 03/29/2020   Bicytopenia 03/08/2020   Avascular necrosis of hip, left ([place]) 03/09/2017   Coronary artery disease with stable angina pectoris ([place]) 01/14/2017   Past Medical History:  Diagnosis Date   Bradycardia    Coronary artery calcification seen on CAT scan 01/14/2017   Essential hypertension 09/11/2020   Family history of thyroid problem    GERD (gastroesophageal reflux disease)    History of kidney stones     Hyperlipidemia    Hypothyroidism    OSA on CPAP    Mild with AHI 7.8/hr >>on CPAP   Osteoarthritis    Shingles 2012   SOB (shortness of breath) on exertion    Thyroid disease     Past Surgical History:  Procedure Laterality Date   CORONARY STENT INTERVENTION N/A 08/24/2020   Procedure: CORONARY STENT INTERVENTION;  Surgeon: [name], MD;  Location: [place] [facility];  Service: Cardiovascular;  Laterality: N/A;   HEEL SPUR SURGERY Left 80's   INTRAVASCULAR PRESSURE WIRE/FFR STUDY N/A 04/25/2021   Procedure: INTRAVASCULAR PRESSURE WIRE/FFR STUDY;  Surgeon: [name], MD;  Location: [place] [facility];  Service: Cardiovascular;  Laterality: N/A;   INTRAVASCULAR ULTRASOUND/IVUS N/A 08/24/2020   Procedure: Intravascular Ultrasound/IVUS;  Surgeon: [name], MD;  Location: [place] [facility];  Service: Cardiovascular;  Laterality: N/A;   KNEE SURGERY Bilateral    numerous times   LEFT HEART CATH AND CORONARY ANGIOGRAPHY N/A 08/24/2020   Procedure: LEFT HEART CATH AND CORONARY ANGIOGRAPHY;  Surgeon: [name], MD;  Location: [place] [facility];  Service: Cardiovascular;  Laterality: N/A;   LEFT HEART CATH AND CORONARY ANGIOGRAPHY N/A 04/25/2021   Procedure: LEFT HEART CATH AND CORONARY ANGIOGRAPHY;  Surgeon: [name], MD;  Location: [place] [facility];  Service: Cardiovascular;  Laterality: N/A;   NECK SURGERY  70's   PACEMAKER IMPLANT N/A 12/04/2020   Procedure: PACEMAKER IMPLANT;  Surgeon: [name], MD;  Location: [place] [facility];  Service: Cardiovascular;  Laterality: N/A;   TOTAL HIP ARTHROPLASTY Left 03/09/2017   Procedure: LEFT TOTAL HIP  ARTHROPLASTY ANTERIOR APPROACH;  Surgeon: Rod Can, MD;  Location: [facility];  Service: Orthopedics;  Laterality: Left;  Dr. requesting RNFA    Current Outpatient Medications  Medication Sig Dispense Refill Last Dose   acetaminophen (TYLENOL) 500 MG tablet Take 1,000 mg by mouth every 6 (six) hours as  needed (pain.).      albuterol (VENTOLIN HFA) 108 (90 Base) MCG/ACT inhaler Inhale 2 puffs into the lungs every 6 (six) hours as needed for shortness of breath or wheezing.      aspirin EC 81 MG tablet Take 81 mg by mouth daily at 4 PM. Swallow whole.      ezetimibe (ZETIA) 10 MG tablet Take 10 mg by mouth in the morning.      finasteride (PROSCAR) 5 MG tablet Take 5 mg by mouth daily.      gabapentin (NEURONTIN) 100 MG capsule Take 200 mg by mouth at bedtime.      ibuprofen (ADVIL) 200 MG tablet Take 400 mg by mouth every 8 (eight) hours as needed (inflammation/pain.).      isosorbide mononitrate (IMDUR) 120 MG 24 hr tablet TAKE 1 TABLET BY MOUTH DAILY 90 tablet 2    ketotifen (ALAWAY) 0.025 % ophthalmic solution Place 1 drop into both eyes 2 (two) times daily as needed (allergies).      levothyroxine (SYNTHROID) 112 MCG tablet Take 112 mcg by mouth daily before breakfast.      loteprednol (LOTEMAX) 0.5 % ophthalmic suspension Place 1 drop into the left eye 2 (two) times a week. Mondays & Fridays      nitroGLYCERIN (NITROSTAT) 0.4 MG SL tablet Place 1 tablet (0.4 mg total) under the tongue every 5 (five) minutes x 3 doses as needed for chest pain. 25 tablet 4    pantoprazole (PROTONIX) 40 MG tablet Take 40 mg by mouth daily after supper.       ranolazine (RANEXA) 1000 MG SR tablet TAKE 1  BY MOUTH TWICE DAILY 180 tablet 2    rosuvastatin (CRESTOR) 5 MG tablet TAKE 1 TABLET BY MOUTH DAILY 90 tablet 3    shark liver oil-cocoa butter (PREPARATION H) 0.25-3-85.5 % suppository Place 1 suppository rectally daily as needed for hemorrhoids.      tamsulosin (FLOMAX) 0.4 MG CAPS capsule Take 0.8 mg by mouth daily with supper.      Tiotropium Bromide Monohydrate (SPIRIVA RESPIMAT) 2.5 MCG/ACT AERS Inhale 2 puffs into the lungs daily. 4 g 3    vitamin B-12 (CYANOCOBALAMIN) 1000 MCG tablet Take 1,000 mcg by mouth in the morning.      No current facility-administered medications for this visit.   Allergies   Allergen Reactions   [place] Venom Swelling    Extreme Swelling of site     Cleocin [Clindamycin Hcl] Rash    RASH IN BETWEEN FINGERS   Statins Other (See Comments)    Muscles aches with several statins    Social History   Tobacco Use   Smoking status: Former    Types: Cigarettes    Quit date: 12/05/2016    Years since quitting: 4.8   Smokeless tobacco: Never  Substance Use Topics   Alcohol use: No    Family History  Problem Relation Age of Onset   Dementia Mother    Heart Problems Father    COPD Father    Lung cancer Father      Review of Systems  Musculoskeletal:  Positive for arthralgias.  All other systems reviewed and are negative.  Objective:  Physical Exam HENT:     Head: Normocephalic.  Eyes:     Pupils: Pupils are equal, round, and reactive to light.  Cardiovascular:     Rate and Rhythm: Normal rate.     Pulses: Normal pulses.  Pulmonary:     Effort: Pulmonary effort is normal.  Abdominal:     Palpations: Abdomen is soft.  Genitourinary:    Comments: Deferred Musculoskeletal:        General: Tenderness present.     Cervical back: Normal range of motion.  Neurological:     Mental Status: He is alert and oriented to person, place, and time.  Psychiatric:        Behavior: Behavior normal.    Vital signs in last 24 hours: @VSRANGES @  Labs:   Estimated body mass index is 34.16 kg/m as calculated from the following:   Height as of 09/12/21: 5\' 9"  (1.753 m).   Weight as of 09/25/21: 104.9 kg.   Imaging Review Plain radiographs demonstrate severe degenerative joint disease of the right knee(s). The bone quality appears to be adequate for age and reported activity level.      Assessment/Plan:  End stage arthritis, right knee   The patient history, physical examination, clinical judgment of the provider and imaging studies are consistent with end stage degenerative joint disease of the right knee(s) and total knee arthroplasty is deemed  medically necessary. The treatment options including medical management, injection therapy arthroscopy and arthroplasty were discussed at length. The risks and benefits of total knee arthroplasty were presented and reviewed. The risks due to aseptic loosening, infection, stiffness, patella tracking problems, thromboembolic complications and other imponderables were discussed. The patient acknowledged the explanation, agreed to proceed with the plan and consent was signed. Patient is being admitted for inpatient treatment for surgery, pain control, PT, OT, prophylactic antibiotics, VTE prophylaxis, progressive ambulation and ADL's and discharge planning. The patient is planning to be discharged  home after overnight observation     Patient's anticipated LOS is less than 2 midnights, meeting these requirements: - Lives within 1 hour of care - Has a competent adult at home to recover with post-op recover - NO history of  - Chronic pain requiring opiods  - Diabetes  - Coronary Artery Disease  - Heart failure  - Heart attack  - Stroke  - DVT/VTE  - Cardiac arrhythmia  - Respiratory Failure/COPD  - Renal failure  - Anemia  - Advanced Liver disease

## 2021-10-21 NOTE — Progress Notes (Signed)
PERIOPERATIVE PRESCRIPTION FOR IMPLANTED CARDIAC DEVICE PROGRAMMING  Patient Information: Name:  Austin Oliver  DOB:  [birth year]  MRN:  [id number]    Planned Procedure:  Right total Knee arthroplasty  Surgeon:  Dr. Rod Can  Date of Procedure:  10/31/21  Cautery will be used.  Position during surgery:     Please send documentation back to:  [facility] (Fax # [phone])  Device Information:  Clinic EP Physician:  Allegra Lai, MD   Device Type:  Pacemaker Manufacturer and Phone #:  Medtronic: [id number] Pacemaker Dependent?:  No. Date of Last Device Check:  10/19/ 22Normal Device Function?:  Yes.    Electrophysiologist's Recommendations:  Have magnet available. Provide continuous ECG monitoring when magnet is used or reprogramming is to be performed.  Procedure should not interfere with device function.  No device programming or magnet placement needed.  Per Device Clinic Standing Orders, Wanda Plump, RN  5:32 PM 10/21/2021

## 2021-10-22 ENCOUNTER — Other Ambulatory Visit: Payer: Self-pay

## 2021-10-22 ENCOUNTER — Encounter (HOSPITAL_COMMUNITY): Payer: Self-pay

## 2021-10-22 ENCOUNTER — Encounter (HOSPITAL_COMMUNITY)
Admission: RE | Admit: 2021-10-22 | Discharge: 2021-10-22 | Disposition: A | Discharge status: Home / Self Care | Payer: Medicare Other | Source: Ambulatory Visit | Attending: Orthopedic Surgery | Admitting: Orthopedic Surgery

## 2021-10-22 VITALS — BP 122/69 | HR 70 | Temp 98.0°F | Resp 18 | Ht 68.5 in | Wt 234.2 lb

## 2021-10-22 DIAGNOSIS — Z01818 Encounter for other preprocedural examination: Secondary | ICD-10-CM

## 2021-10-22 DIAGNOSIS — I10 Essential (primary) hypertension: Secondary | ICD-10-CM | POA: Diagnosis not present

## 2021-10-22 DIAGNOSIS — Z87891 Personal history of nicotine dependence: Secondary | ICD-10-CM | POA: Insufficient documentation

## 2021-10-22 DIAGNOSIS — Z95810 Presence of automatic (implantable) cardiac defibrillator: Secondary | ICD-10-CM | POA: Diagnosis not present

## 2021-10-22 DIAGNOSIS — Z01812 Encounter for preprocedural laboratory examination: Secondary | ICD-10-CM | POA: Insufficient documentation

## 2021-10-22 DIAGNOSIS — M1711 Unilateral primary osteoarthritis, right knee: Secondary | ICD-10-CM | POA: Diagnosis not present

## 2021-10-22 DIAGNOSIS — G4733 Obstructive sleep apnea (adult) (pediatric): Secondary | ICD-10-CM | POA: Insufficient documentation

## 2021-10-22 DIAGNOSIS — J449 Chronic obstructive pulmonary disease, unspecified: Secondary | ICD-10-CM | POA: Insufficient documentation

## 2021-10-22 DIAGNOSIS — K7460 Unspecified cirrhosis of liver: Secondary | ICD-10-CM | POA: Insufficient documentation

## 2021-10-22 HISTORY — DX: Presence of automatic (implantable) cardiac defibrillator: Z95.810

## 2021-10-22 HISTORY — DX: Chronic obstructive pulmonary disease, unspecified: J44.9

## 2021-10-22 HISTORY — DX: Unspecified cirrhosis of liver: K74.60

## 2021-10-22 LAB — CBC
HCT: 34.8 % — ABNORMAL LOW (ref 39.0–52.0)
Hemoglobin: 11.2 g/dL — ABNORMAL LOW (ref 13.0–17.0)
MCH: 32 pg (ref 26.0–34.0)
MCHC: 32.2 g/dL (ref 30.0–36.0)
MCV: 99.4 fL (ref 80.0–100.0)
Platelets: 116 10*3/uL — ABNORMAL LOW (ref 150–400)
RBC: 3.5 MIL/uL — ABNORMAL LOW (ref 4.22–5.81)
RDW: 13.6 % (ref 11.5–15.5)
WBC: 3.4 10*3/uL — ABNORMAL LOW (ref 4.0–10.5)
nRBC: 0 % (ref 0.0–0.2)

## 2021-10-22 LAB — COMPREHENSIVE METABOLIC PANEL
ALT: 13 U/L (ref 0–44)
AST: 15 U/L (ref 15–41)
Albumin: 4.1 g/dL (ref 3.5–5.0)
Alkaline Phosphatase: 49 U/L (ref 38–126)
Anion gap: 8 (ref 5–15)
BUN: 23 mg/dL (ref 8–23)
CO2: 27 mmol/L (ref 22–32)
Calcium: 8.8 mg/dL — ABNORMAL LOW (ref 8.9–10.3)
Chloride: 102 mmol/L (ref 98–111)
Creatinine, Ser: 1.38 mg/dL — ABNORMAL HIGH (ref 0.61–1.24)
GFR, Estimated: 56 mL/min — ABNORMAL LOW (ref >60)
Glucose, Bld: 206 mg/dL — ABNORMAL HIGH (ref 70–99)
Potassium: 4.2 mmol/L (ref 3.5–5.1)
Sodium: 137 mmol/L (ref 135–145)
Total Bilirubin: 2.5 mg/dL — ABNORMAL HIGH (ref 0.3–1.2)
Total Protein: 6.8 g/dL (ref 6.5–8.1)

## 2021-10-22 LAB — SURGICAL PCR SCREEN
MRSA, PCR: NEGATIVE
Staphylococcus aureus: NEGATIVE

## 2021-10-22 LAB — PROTIME-INR
INR: 1.1 (ref 0.8–1.2)
Prothrombin Time: 14.1 seconds (ref 11.4–15.2)

## 2021-10-22 NOTE — Progress Notes (Signed)
COVID test- 12/13  PCP - Dr. Gwyneth Revels Cardiologist - Dr. Sheliah Hatch Pulmonary- Dr. Annamaria Boots  Chest x-ray -  EKG - 04/25/21-epic Stress Test - 08/06/20-epic ECHO - 10/02/20-epic Cardiac Cath - with stent- 2021,2022 Pacemaker/ICD device last checked:09/04/21  Sleep Study - yes CPAP - yes  Fasting Blood Sugar - NA Checks Blood Sugar _____ times a day  Blood Thinner Instructions:ASA 81mg  /Dr. [name] Aspirin Instructions:Stop 7 days prior Last Dose:Pt stopped 10/22/21  Anesthesia review: yes  Patient denies shortness of breath, fever, cough and chest pain at PAT appointment Pt denies SOB unless he is bending over. Not with activities. He had a pacemaker placed 12/04/20. Order is on chart  Patient verbalized understanding of instructions that were given to them at the PAT appointment. Patient was also instructed that they will need to review over the PAT instructions again at home before surgery. yes

## 2021-10-23 NOTE — Progress Notes (Signed)
Anesthesia Chart Review   Case: [id number] Date/Time: 10/31/21 1310   Procedure: COMPUTER ASSISTED TOTAL KNEE ARTHROPLASTY (Right: Knee)   Anesthesia type: Spinal   Pre-op diagnosis: Right knee osteoarthritis   Location: [facility] ROOM 08 / [facility]   Surgeons: Rod Can, MD       DISCUSSION:65 y.o. former smoker with h/o GERD, HTN, OSA COPD, liver cirrhosis, AICD in place (device orders in 10/21/2021 progress note), right knee OA scheduled for above procedure 10/31/2021 with Dr. Rod Can.   Last seen by cardiology 06/18/21. Stable at this visit.  Per Dr. [name], "Reasonable to proceed to surgery unless new changes."  Anticipate pt can proceed with planned procedure barring acute status change.   VS: BP 122/69   Pulse 70   Temp 36.7 C (Oral)   Resp 18   Ht 5' 8.5" (1.74 m)   Wt 106.3 kg   SpO2 100%   BMI 35.10 kg/m   PROVIDERS: [name], MD is PCP   Cardiologist:  [name], MD   LABS: Labs reviewed: Acceptable for surgery. (all labs ordered are listed, but only abnormal results are displayed)  Labs Reviewed  CBC - Abnormal; Notable for the following components:      Result Value   WBC 3.4 (*)    RBC 3.50 (*)    Hemoglobin 11.2 (*)    HCT 34.8 (*)    Platelets 116 (*)    All other components within normal limits  COMPREHENSIVE METABOLIC PANEL - Abnormal; Notable for the following components:   Glucose, Bld 206 (*)    Creatinine, Ser 1.38 (*)    Calcium 8.8 (*)    Total Bilirubin 2.5 (*)    GFR, Estimated 56 (*)    All other components within normal limits  SURGICAL PCR SCREEN  PROTIME-INR  TYPE AND SCREEN     IMAGES:   EKG: 04/25/2021 Rate 60 bpm  Atrial-paced rhythm with prolonged AV conduction Abnormal ECG No significant change since last tracing  CV: Cardiac Cath 04/25/2021 Conclusions: Mild to moderate, non-obstructive coronary artery disease, as detailed below.  Overall appearance is similar to completion of last  year's catheterization.  LAD, D3, and RCA/rPLA disease is not hemodynamically significant by RFR. Widely patent ostial/proximal LAD stent. Mildly elevated left ventricular filling pressure (LVEDP 15-20 mmHg).   Recommendations: Continue aggressive secondary prevention of coronary artery disease. Consider workup for alternative etiologies of persistent atypical chest pain and fatigue.  Echo 10/02/2020  1. Left ventricular ejection fraction, by estimation, is 50 to 55%. The  left ventricle has low normal function. The left ventricle has no regional  wall motion abnormalities. The left ventricular internal cavity size was  mildly dilated. Left ventricular  diastolic parameters were normal.   2. Right ventricular systolic function is normal. The right ventricular  size is normal. Tricuspid regurgitation signal is inadequate for assessing  PA pressure.   3. Left atrial size was mildly dilated.   4. The mitral valve is normal in structure. No evidence of mitral valve  regurgitation.   5. The aortic valve is tricuspid. There is mild calcification of the  aortic valve. There is mild thickening of the aortic valve. Aortic valve  regurgitation is trivial. Mild aortic valve sclerosis is present, with no  evidence of aortic valve stenosis.   6. The inferior vena cava is normal in size with greater than 50%  respiratory variability, suggesting right atrial pressure of 3 mmHg.   Myocardial Perfusion 08/06/2020 Nuclear stress  EF: 57%. There was no ST segment deviation noted during stress. The study is normal. This is a low risk study. The left ventricular ejection fraction is normal (55-65%).   Normal pharmacologic nuclear stress test with no evidence for prior infarct or ischemia. LVEF 57%. Left ventricle appears mildly dilated. Past Medical History:  Diagnosis Date   AICD (automatic cardioverter/defibrillator) present    for sick sinus syndrome   Cirrhosis of liver (HCC)    COPD (chronic  obstructive pulmonary disease) ([place])    Coronary artery calcification seen on CAT scan 01/14/2017   Essential hypertension 09/11/2020   GERD (gastroesophageal reflux disease)    History of kidney stones    Hyperlipidemia    Hypothyroidism    OSA on CPAP    Mild with AHI 7.8/hr >>on CPAP   Osteoarthritis    Shingles 2012    Past Surgical History:  Procedure Laterality Date   CORONARY STENT INTERVENTION N/A 08/24/2020   Procedure: CORONARY STENT INTERVENTION;  Surgeon: [name], MD;  Location: [place] [facility];  Service: Cardiovascular;  Laterality: N/A;   HEEL SPUR SURGERY Left 80's   INTRAVASCULAR PRESSURE WIRE/FFR STUDY N/A 04/25/2021   Procedure: INTRAVASCULAR PRESSURE WIRE/FFR STUDY;  Surgeon: [name], MD;  Location: [place] [facility];  Service: Cardiovascular;  Laterality: N/A;   INTRAVASCULAR ULTRASOUND/IVUS N/A 08/24/2020   Procedure: Intravascular Ultrasound/IVUS;  Surgeon: [name], MD;  Location: [place] [facility];  Service: Cardiovascular;  Laterality: N/A;   KNEE SURGERY Bilateral    numerous times   LEFT HEART CATH AND CORONARY ANGIOGRAPHY N/A 08/24/2020   Procedure: LEFT HEART CATH AND CORONARY ANGIOGRAPHY;  Surgeon: [name], MD;  Location: [place] [facility];  Service: Cardiovascular;  Laterality: N/A;   LEFT HEART CATH AND CORONARY ANGIOGRAPHY N/A 04/25/2021   Procedure: LEFT HEART CATH AND CORONARY ANGIOGRAPHY;  Surgeon: [name], MD;  Location: [facility] [facility];  Service: Cardiovascular;  Laterality: N/A;   NECK SURGERY  70's   PACEMAKER IMPLANT N/A 12/04/2020   Procedure: PACEMAKER IMPLANT;  Surgeon: [name], MD;  Location: [place] [facility];  Service: Cardiovascular;  Laterality: N/A;   TOTAL HIP ARTHROPLASTY Left 03/09/2017   Procedure: LEFT TOTAL HIP ARTHROPLASTY ANTERIOR APPROACH;  Surgeon: Rod Can, MD;  Location: [place];  Service: Orthopedics;  Laterality: Left;  Dr. requesting RNFA    MEDICATIONS:   acetaminophen (TYLENOL) 500 MG tablet   albuterol (VENTOLIN HFA) 108 (90 Base) MCG/ACT inhaler   aspirin EC 81 MG tablet   ezetimibe (ZETIA) 10 MG tablet   finasteride (PROSCAR) 5 MG tablet   gabapentin (NEURONTIN) 100 MG capsule   ibuprofen (ADVIL) 200 MG tablet   isosorbide mononitrate (IMDUR) 120 MG 24 hr tablet   ketotifen (ALAWAY) 0.025 % ophthalmic solution   levothyroxine (SYNTHROID) 112 MCG tablet   loteprednol (LOTEMAX) 0.5 % ophthalmic suspension   nitroGLYCERIN (NITROSTAT) 0.4 MG SL tablet   pantoprazole (PROTONIX) 40 MG tablet   ranolazine (RANEXA) 1000 MG SR tablet   rosuvastatin (CRESTOR) 5 MG tablet   shark liver oil-cocoa butter (PREPARATION H) 0.25-3-85.5 % suppository   tamsulosin (FLOMAX) 0.4 MG CAPS capsule   Tiotropium Bromide Monohydrate (SPIRIVA RESPIMAT) 2.5 MCG/ACT AERS   vitamin B-12 (CYANOCOBALAMIN) 1000 MCG tablet   No current facility-administered medications for this encounter.    Austin Felix Ward, PA-C [facility] [phone]

## 2021-10-29 ENCOUNTER — Other Ambulatory Visit: Payer: Self-pay | Admitting: Orthopedic Surgery

## 2021-10-30 LAB — SARS CORONAVIRUS 2 (TAT 6-24 HRS): SARS Coronavirus 2: NEGATIVE

## 2021-10-31 ENCOUNTER — Ambulatory Visit (HOSPITAL_COMMUNITY)
Admission: RE | Admit: 2021-10-31 | Discharge: 2021-11-01 | Disposition: A | Discharge status: Home / Self Care | Payer: Medicare Other | Source: Ambulatory Visit | Attending: Orthopedic Surgery | Admitting: Orthopedic Surgery

## 2021-10-31 ENCOUNTER — Ambulatory Visit (HOSPITAL_COMMUNITY): Payer: Medicare Other

## 2021-10-31 ENCOUNTER — Encounter (HOSPITAL_COMMUNITY)
Admission: RE | Disposition: A | Discharge status: Home / Self Care | Payer: Self-pay | Source: Ambulatory Visit | Attending: Orthopedic Surgery

## 2021-10-31 ENCOUNTER — Ambulatory Visit (HOSPITAL_COMMUNITY): Payer: Medicare Other | Admitting: Certified Registered"

## 2021-10-31 ENCOUNTER — Ambulatory Visit (HOSPITAL_COMMUNITY): Payer: Medicare Other | Admitting: Physician Assistant

## 2021-10-31 ENCOUNTER — Encounter (HOSPITAL_COMMUNITY): Payer: Self-pay | Admitting: Orthopedic Surgery

## 2021-10-31 ENCOUNTER — Other Ambulatory Visit: Payer: Self-pay

## 2021-10-31 DIAGNOSIS — J449 Chronic obstructive pulmonary disease, unspecified: Secondary | ICD-10-CM | POA: Insufficient documentation

## 2021-10-31 DIAGNOSIS — I25110 Atherosclerotic heart disease of native coronary artery with unstable angina pectoris: Secondary | ICD-10-CM | POA: Insufficient documentation

## 2021-10-31 DIAGNOSIS — M1711 Unilateral primary osteoarthritis, right knee: Secondary | ICD-10-CM | POA: Diagnosis present

## 2021-10-31 DIAGNOSIS — Z87891 Personal history of nicotine dependence: Secondary | ICD-10-CM | POA: Insufficient documentation

## 2021-10-31 DIAGNOSIS — E039 Hypothyroidism, unspecified: Secondary | ICD-10-CM | POA: Diagnosis not present

## 2021-10-31 DIAGNOSIS — K219 Gastro-esophageal reflux disease without esophagitis: Secondary | ICD-10-CM | POA: Insufficient documentation

## 2021-10-31 DIAGNOSIS — G4733 Obstructive sleep apnea (adult) (pediatric): Secondary | ICD-10-CM | POA: Insufficient documentation

## 2021-10-31 DIAGNOSIS — Z96651 Presence of right artificial knee joint: Secondary | ICD-10-CM

## 2021-10-31 DIAGNOSIS — I10 Essential (primary) hypertension: Secondary | ICD-10-CM | POA: Insufficient documentation

## 2021-10-31 DIAGNOSIS — D519 Vitamin B12 deficiency anemia, unspecified: Secondary | ICD-10-CM | POA: Insufficient documentation

## 2021-10-31 HISTORY — PX: KNEE ARTHROPLASTY: SHX992

## 2021-10-31 LAB — TYPE AND SCREEN
ABO/RH(D): O POS
Antibody Screen: NEGATIVE

## 2021-10-31 SURGERY — ARTHROPLASTY, KNEE, TOTAL, USING IMAGELESS COMPUTER-ASSISTED NAVIGATION
Anesthesia: Spinal | Site: Knee | Laterality: Right

## 2021-10-31 MED ORDER — OXYCODONE HCL 5 MG PO TABS
5.0000 mg | ORAL_TABLET | ORAL | Status: DC | PRN
  Administered 2021-11-01: 10 mg via ORAL
  Filled 2021-10-31 (×2): qty 2

## 2021-10-31 MED ORDER — ALUM & MAG HYDROXIDE-SIMETH 200-200-20 MG/5ML PO SUSP: 30.0000 mL | ORAL | Status: DC | PRN

## 2021-10-31 MED ORDER — FINASTERIDE 5 MG PO TABS: 5.0000 mg | ORAL_TABLET | Freq: Every day | ORAL | Status: DC

## 2021-10-31 MED ORDER — STERILE WATER FOR IRRIGATION IR SOLN
Status: DC | PRN
  Administered 2021-10-31: 2000 mL

## 2021-10-31 MED ORDER — ISOSORBIDE MONONITRATE ER 60 MG PO TB24
120.0000 mg | ORAL_TABLET | Freq: Every day | ORAL | Status: DC
  Administered 2021-10-31 – 2021-11-01 (×2): 120 mg via ORAL
  Filled 2021-10-31 (×2): qty 2

## 2021-10-31 MED ORDER — PROPOFOL 1000 MG/100ML IV EMUL
INTRAVENOUS | Status: AC
  Filled 2021-10-31: qty 100

## 2021-10-31 MED ORDER — AMISULPRIDE (ANTIEMETIC) 5 MG/2ML IV SOLN: 10.0000 mg | Freq: Once | INTRAVENOUS | Status: DC | PRN

## 2021-10-31 MED ORDER — VITAMIN B-12 1000 MCG PO TABS
1000.0000 ug | ORAL_TABLET | Freq: Every morning | ORAL | Status: DC
  Administered 2021-11-01: 1000 ug via ORAL
  Filled 2021-10-31: qty 1

## 2021-10-31 MED ORDER — LOTEPREDNOL ETABONATE 0.5 % OP SUSP: 1.0000 [drp] | OPHTHALMIC | Status: DC

## 2021-10-31 MED ORDER — ONDANSETRON HCL 4 MG PO TABS: 4.0000 mg | ORAL_TABLET | Freq: Four times a day (QID) | ORAL | Status: DC | PRN

## 2021-10-31 MED ORDER — MIDAZOLAM HCL 2 MG/2ML IJ SOLN
2.0000 mg | Freq: Once | INTRAMUSCULAR | Status: AC
  Administered 2021-10-31: 1 mg via INTRAVENOUS
  Filled 2021-10-31: qty 2

## 2021-10-31 MED ORDER — METHOCARBAMOL 500 MG PO TABS
500.0000 mg | ORAL_TABLET | Freq: Four times a day (QID) | ORAL | Status: DC | PRN
  Administered 2021-11-01 (×2): 500 mg via ORAL
  Filled 2021-10-31 (×2): qty 1

## 2021-10-31 MED ORDER — CEFAZOLIN SODIUM-DEXTROSE 2-4 GM/100ML-% IV SOLN
2.0000 g | Freq: Four times a day (QID) | INTRAVENOUS | Status: AC
  Administered 2021-10-31 – 2021-11-01 (×2): 2 g via INTRAVENOUS
  Filled 2021-10-31 (×2): qty 100

## 2021-10-31 MED ORDER — KETOROLAC TROMETHAMINE 30 MG/ML IJ SOLN
INTRAMUSCULAR | Status: AC
  Filled 2021-10-31: qty 1

## 2021-10-31 MED ORDER — 0.9 % SODIUM CHLORIDE (POUR BTL) OPTIME
TOPICAL | Status: DC | PRN
  Administered 2021-10-31: 1000 mL

## 2021-10-31 MED ORDER — POLYETHYLENE GLYCOL 3350 17 G PO PACK: 17.0000 g | PACK | Freq: Every day | ORAL | Status: DC | PRN

## 2021-10-31 MED ORDER — RANOLAZINE ER 500 MG PO TB12
1000.0000 mg | ORAL_TABLET | Freq: Two times a day (BID) | ORAL | Status: DC
  Administered 2021-10-31 – 2021-11-01 (×2): 1000 mg via ORAL
  Filled 2021-10-31 (×3): qty 2

## 2021-10-31 MED ORDER — MENTHOL 3 MG MT LOZG: 1.0000 | LOZENGE | OROMUCOSAL | Status: DC | PRN

## 2021-10-31 MED ORDER — ONDANSETRON HCL 4 MG/2ML IJ SOLN: 4.0000 mg | Freq: Four times a day (QID) | INTRAMUSCULAR | Status: DC | PRN

## 2021-10-31 MED ORDER — ACETAMINOPHEN 10 MG/ML IV SOLN
1000.0000 mg | Freq: Once | INTRAVENOUS | Status: AC
  Administered 2021-10-31: 1000 mg via INTRAVENOUS
  Filled 2021-10-31: qty 100

## 2021-10-31 MED ORDER — DIPHENHYDRAMINE HCL 12.5 MG/5ML PO ELIX: 12.5000 mg | ORAL_SOLUTION | ORAL | Status: DC | PRN

## 2021-10-31 MED ORDER — CHLORHEXIDINE GLUCONATE 0.12 % MT SOLN
15.0000 mL | Freq: Once | OROMUCOSAL | Status: AC
  Administered 2021-10-31: 15 mL via OROMUCOSAL

## 2021-10-31 MED ORDER — ONDANSETRON HCL 4 MG/2ML IJ SOLN
INTRAMUSCULAR | Status: DC | PRN
  Administered 2021-10-31: 4 mg via INTRAVENOUS

## 2021-10-31 MED ORDER — DEXAMETHASONE SODIUM PHOSPHATE 10 MG/ML IJ SOLN
INTRAMUSCULAR | Status: DC | PRN
  Administered 2021-10-31 (×2): 5 mg

## 2021-10-31 MED ORDER — MIDAZOLAM HCL 2 MG/2ML IJ SOLN
INTRAMUSCULAR | Status: AC
  Filled 2021-10-31: qty 2

## 2021-10-31 MED ORDER — METHOCARBAMOL 1000 MG/10ML IJ SOLN
500.0000 mg | Freq: Four times a day (QID) | INTRAVENOUS | Status: DC | PRN
  Filled 2021-10-31: qty 5

## 2021-10-31 MED ORDER — BUPIVACAINE IN DEXTROSE 0.75-8.25 % IT SOLN
INTRATHECAL | Status: DC | PRN
  Administered 2021-10-31: 1.9 mL via INTRATHECAL

## 2021-10-31 MED ORDER — PANTOPRAZOLE SODIUM 40 MG PO TBEC
40.0000 mg | DELAYED_RELEASE_TABLET | Freq: Every day | ORAL | Status: DC
  Administered 2021-10-31: 40 mg via ORAL
  Filled 2021-10-31: qty 1

## 2021-10-31 MED ORDER — KETOROLAC TROMETHAMINE 30 MG/ML IJ SOLN
INTRAMUSCULAR | Status: DC | PRN
  Administered 2021-10-31: 30 mg

## 2021-10-31 MED ORDER — ALBUTEROL SULFATE HFA 108 (90 BASE) MCG/ACT IN AERS: 2.0000 | INHALATION_SPRAY | Freq: Four times a day (QID) | RESPIRATORY_TRACT | Status: DC | PRN

## 2021-10-31 MED ORDER — DOCUSATE SODIUM 100 MG PO CAPS
100.0000 mg | ORAL_CAPSULE | Freq: Two times a day (BID) | ORAL | Status: DC
  Administered 2021-10-31 – 2021-11-01 (×2): 100 mg via ORAL
  Filled 2021-10-31 (×2): qty 1

## 2021-10-31 MED ORDER — ONDANSETRON HCL 4 MG/2ML IJ SOLN
INTRAMUSCULAR | Status: AC
  Filled 2021-10-31: qty 2

## 2021-10-31 MED ORDER — PROPOFOL 500 MG/50ML IV EMUL
INTRAVENOUS | Status: DC | PRN
  Administered 2021-10-31: 40 ug/kg/min via INTRAVENOUS

## 2021-10-31 MED ORDER — DEXAMETHASONE SODIUM PHOSPHATE 10 MG/ML IJ SOLN
10.0000 mg | Freq: Once | INTRAMUSCULAR | Status: AC
  Administered 2021-11-01: 10 mg via INTRAVENOUS
  Filled 2021-10-31: qty 1

## 2021-10-31 MED ORDER — DEXAMETHASONE SODIUM PHOSPHATE 10 MG/ML IJ SOLN
INTRAMUSCULAR | Status: AC
  Filled 2021-10-31: qty 1

## 2021-10-31 MED ORDER — TAMSULOSIN HCL 0.4 MG PO CAPS
0.8000 mg | ORAL_CAPSULE | Freq: Every day | ORAL | Status: DC
  Administered 2021-10-31: 0.8 mg via ORAL
  Filled 2021-10-31: qty 2

## 2021-10-31 MED ORDER — METOCLOPRAMIDE HCL 5 MG/ML IJ SOLN: 5.0000 mg | Freq: Three times a day (TID) | INTRAMUSCULAR | Status: DC | PRN

## 2021-10-31 MED ORDER — PROMETHAZINE HCL 25 MG/ML IJ SOLN: 6.2500 mg | INTRAMUSCULAR | Status: DC | PRN

## 2021-10-31 MED ORDER — SENNA 8.6 MG PO TABS
1.0000 | ORAL_TABLET | Freq: Two times a day (BID) | ORAL | Status: DC
  Administered 2021-10-31 – 2021-11-01 (×2): 8.6 mg via ORAL
  Filled 2021-10-31 (×2): qty 1

## 2021-10-31 MED ORDER — KETOTIFEN FUMARATE 0.025 % OP SOLN
1.0000 [drp] | Freq: Two times a day (BID) | OPHTHALMIC | Status: DC | PRN
  Filled 2021-10-31: qty 5

## 2021-10-31 MED ORDER — TIOTROPIUM BROMIDE MONOHYDRATE 2.5 MCG/ACT IN AERS: 2.0000 | INHALATION_SPRAY | Freq: Every day | RESPIRATORY_TRACT | Status: DC

## 2021-10-31 MED ORDER — SODIUM CHLORIDE 0.9 % IR SOLN
Status: DC | PRN
  Administered 2021-10-31: 1000 mL

## 2021-10-31 MED ORDER — HYDROMORPHONE HCL 1 MG/ML IJ SOLN
0.5000 mg | INTRAMUSCULAR | Status: DC | PRN
  Administered 2021-11-01: 1 mg via INTRAVENOUS
  Filled 2021-10-31: qty 1

## 2021-10-31 MED ORDER — UMECLIDINIUM BROMIDE 62.5 MCG/ACT IN AEPB
1.0000 | INHALATION_SPRAY | Freq: Every day | RESPIRATORY_TRACT | Status: DC
  Filled 2021-10-31: qty 7

## 2021-10-31 MED ORDER — LACTATED RINGERS IV SOLN: INTRAVENOUS | Status: DC

## 2021-10-31 MED ORDER — SODIUM CHLORIDE 0.9 % IV SOLN: INTRAVENOUS | Status: DC

## 2021-10-31 MED ORDER — EZETIMIBE 10 MG PO TABS
10.0000 mg | ORAL_TABLET | Freq: Every morning | ORAL | Status: DC
  Administered 2021-11-01: 10 mg via ORAL
  Filled 2021-10-31: qty 1

## 2021-10-31 MED ORDER — BUPIVACAINE-EPINEPHRINE (PF) 0.25% -1:200000 IJ SOLN
INTRAMUSCULAR | Status: AC
  Filled 2021-10-31: qty 30

## 2021-10-31 MED ORDER — ROSUVASTATIN CALCIUM 5 MG PO TABS
5.0000 mg | ORAL_TABLET | Freq: Every day | ORAL | Status: DC
  Administered 2021-10-31 – 2021-11-01 (×2): 5 mg via ORAL
  Filled 2021-10-31 (×2): qty 1

## 2021-10-31 MED ORDER — ROPIVACAINE HCL 7.5 MG/ML IJ SOLN
INTRAMUSCULAR | Status: DC | PRN
  Administered 2021-10-31: 20 mL via PERINEURAL

## 2021-10-31 MED ORDER — LIDOCAINE 2% (20 MG/ML) 5 ML SYRINGE
INTRAMUSCULAR | Status: DC | PRN
  Administered 2021-10-31: 10 mg via INTRAVENOUS

## 2021-10-31 MED ORDER — ACETAMINOPHEN 325 MG PO TABS
325.0000 mg | ORAL_TABLET | Freq: Four times a day (QID) | ORAL | Status: DC | PRN
  Administered 2021-11-01: 650 mg via ORAL
  Administered 2021-11-01: 325 mg via ORAL
  Filled 2021-10-31 (×2): qty 2

## 2021-10-31 MED ORDER — IRRISEPT - 450ML BOTTLE WITH 0.05% CHG IN STERILE WATER, USP 99.95% OPTIME
TOPICAL | Status: DC | PRN
  Administered 2021-10-31: 450 mL

## 2021-10-31 MED ORDER — OXYCODONE HCL 5 MG PO TABS: 10.0000 mg | ORAL_TABLET | ORAL | Status: DC | PRN

## 2021-10-31 MED ORDER — SODIUM CHLORIDE 0.9% FLUSH
INTRAVENOUS | Status: DC | PRN
  Administered 2021-10-31: 30 mL

## 2021-10-31 MED ORDER — PROPOFOL 10 MG/ML IV BOLUS
INTRAVENOUS | Status: DC | PRN
  Administered 2021-10-31: 40 mg via INTRAVENOUS

## 2021-10-31 MED ORDER — CELECOXIB 200 MG PO CAPS
200.0000 mg | ORAL_CAPSULE | Freq: Once | ORAL | Status: AC
  Administered 2021-10-31: 200 mg via ORAL
  Filled 2021-10-31: qty 1

## 2021-10-31 MED ORDER — ISOPROPYL ALCOHOL 70 % SOLN
Status: DC | PRN
  Administered 2021-10-31: 1 via TOPICAL

## 2021-10-31 MED ORDER — TRANEXAMIC ACID-NACL 1000-0.7 MG/100ML-% IV SOLN
1000.0000 mg | INTRAVENOUS | Status: AC
  Administered 2021-10-31: 1000 mg via INTRAVENOUS
  Filled 2021-10-31: qty 100

## 2021-10-31 MED ORDER — FENTANYL CITRATE PF 50 MCG/ML IJ SOSY
100.0000 ug | PREFILLED_SYRINGE | Freq: Once | INTRAMUSCULAR | Status: AC
  Administered 2021-10-31: 100 ug via INTRAVENOUS
  Filled 2021-10-31: qty 2

## 2021-10-31 MED ORDER — PHENOL 1.4 % MT LIQD: 1.0000 | OROMUCOSAL | Status: DC | PRN

## 2021-10-31 MED ORDER — PHENYLEPHRINE HCL-NACL 20-0.9 MG/250ML-% IV SOLN
INTRAVENOUS | Status: DC | PRN
  Administered 2021-10-31: 10 ug/min via INTRAVENOUS
  Administered 2021-10-31: 40 ug/min via INTRAVENOUS

## 2021-10-31 MED ORDER — GABAPENTIN 100 MG PO CAPS
200.0000 mg | ORAL_CAPSULE | Freq: Every day | ORAL | Status: DC
  Administered 2021-10-31: 200 mg via ORAL
  Filled 2021-10-31: qty 2

## 2021-10-31 MED ORDER — CEFAZOLIN SODIUM-DEXTROSE 2-4 GM/100ML-% IV SOLN
2.0000 g | INTRAVENOUS | Status: AC
  Administered 2021-10-31: 2 g via INTRAVENOUS
  Filled 2021-10-31: qty 100

## 2021-10-31 MED ORDER — POVIDONE-IODINE 10 % EX SWAB
2.0000 "application " | Freq: Once | CUTANEOUS | Status: AC
  Administered 2021-10-31: 2 via TOPICAL

## 2021-10-31 MED ORDER — NITROGLYCERIN 0.4 MG SL SUBL: 0.4000 mg | SUBLINGUAL_TABLET | SUBLINGUAL | Status: DC | PRN

## 2021-10-31 MED ORDER — POVIDONE-IODINE 10 % EX SWAB: 2.0000 "application " | Freq: Once | CUTANEOUS | Status: DC

## 2021-10-31 MED ORDER — ASPIRIN 81 MG PO CHEW
81.0000 mg | CHEWABLE_TABLET | Freq: Two times a day (BID) | ORAL | Status: DC
  Administered 2021-10-31 – 2021-11-01 (×2): 81 mg via ORAL
  Filled 2021-10-31 (×2): qty 1

## 2021-10-31 MED ORDER — LEVOTHYROXINE SODIUM 112 MCG PO TABS
112.0000 ug | ORAL_TABLET | Freq: Every day | ORAL | Status: DC
  Administered 2021-11-01: 112 ug via ORAL
  Filled 2021-10-31: qty 1

## 2021-10-31 MED ORDER — METOCLOPRAMIDE HCL 5 MG PO TABS: 5.0000 mg | ORAL_TABLET | Freq: Three times a day (TID) | ORAL | Status: DC | PRN

## 2021-10-31 MED ORDER — BUPIVACAINE-EPINEPHRINE 0.25% -1:200000 IJ SOLN
INTRAMUSCULAR | Status: DC | PRN
  Administered 2021-10-31: 30 mL

## 2021-10-31 MED ORDER — FENTANYL CITRATE PF 50 MCG/ML IJ SOSY: 25.0000 ug | PREFILLED_SYRINGE | INTRAMUSCULAR | Status: DC | PRN

## 2021-10-31 MED ORDER — SODIUM CHLORIDE (PF) 0.9 % IJ SOLN
INTRAMUSCULAR | Status: AC
  Filled 2021-10-31: qty 30

## 2021-10-31 MED ORDER — ORAL CARE MOUTH RINSE: 15.0000 mL | Freq: Once | OROMUCOSAL | Status: AC

## 2021-10-31 MED ORDER — MIDAZOLAM HCL 2 MG/2ML IJ SOLN
INTRAMUSCULAR | Status: DC | PRN
  Administered 2021-10-31: 1 mg via INTRAVENOUS

## 2021-10-31 SURGICAL SUPPLY — 70 items
ADH SKN CLS APL DERMABOND .7 (GAUZE/BANDAGES/DRESSINGS) ×1
APL PRP STRL LF DISP 70% ISPRP (MISCELLANEOUS) ×2
BAG COUNTER SPONGE SURGICOUNT (BAG) IMPLANT
BAG SPEC THK2 15X12 ZIP CLS (MISCELLANEOUS)
BAG SPNG CNTER NS LX DISP (BAG)
BAG ZIPLOCK 12X15 (MISCELLANEOUS) IMPLANT
BATTERY INSTRU NAVIGATION (MISCELLANEOUS) ×6 IMPLANT
BLADE SAW RECIPROCATING 77.5 (BLADE) ×2 IMPLANT
BNDG ELASTIC 4X5.8 VLCR STR LF (GAUZE/BANDAGES/DRESSINGS) ×2 IMPLANT
BNDG ELASTIC 6X5.8 VLCR STR LF (GAUZE/BANDAGES/DRESSINGS) ×2 IMPLANT
BSPLAT TIB 6 KN TRITANIUM (Knees) ×1 IMPLANT
BTRY SRG DRVR LF (MISCELLANEOUS) ×3
CHLORAPREP W/TINT 26 (MISCELLANEOUS) ×4 IMPLANT
COMPONENT TRI CR FEM SZ7 KNEE (Orthopedic Implant) IMPLANT
COVER SURGICAL LIGHT HANDLE (MISCELLANEOUS) ×2 IMPLANT
DECANTER SPIKE VIAL GLASS SM (MISCELLANEOUS) ×4 IMPLANT
DERMABOND ADVANCED (GAUZE/BANDAGES/DRESSINGS) ×1
DERMABOND ADVANCED .7 DNX12 (GAUZE/BANDAGES/DRESSINGS) ×2 IMPLANT
DRAPE INCISE IOBAN 66X45 STRL (DRAPES) ×6 IMPLANT
DRAPE SHEET LG 3/4 BI-LAMINATE (DRAPES) ×6 IMPLANT
DRAPE U-SHAPE 47X51 STRL (DRAPES) ×2 IMPLANT
DRSG AQUACEL AG ADV 3.5X14 (GAUZE/BANDAGES/DRESSINGS) ×2 IMPLANT
ELECT BLADE TIP CTD 4 INCH (ELECTRODE) ×2 IMPLANT
ELECT REM PT RETURN 15FT ADLT (MISCELLANEOUS) ×2 IMPLANT
GAUZE SPONGE 4X4 12PLY STRL (GAUZE/BANDAGES/DRESSINGS) ×2 IMPLANT
GLOVE SRG 8 PF TXTR STRL LF DI (GLOVE) ×2 IMPLANT
GLOVE SURG ENC MOIS LTX SZ8.5 (GLOVE) ×4 IMPLANT
GLOVE SURG ENC TEXT LTX SZ7.5 (GLOVE) ×6 IMPLANT
GLOVE SURG UNDER POLY LF SZ8 (GLOVE) ×4
GLOVE SURG UNDER POLY LF SZ8.5 (GLOVE) ×2 IMPLANT
GOWN SPEC L3 XXLG W/TWL (GOWN DISPOSABLE) ×2 IMPLANT
GOWN STRL REUS W/TWL XL LVL3 (GOWN DISPOSABLE) ×2 IMPLANT
HANDPIECE INTERPULSE COAX TIP (DISPOSABLE) ×2
HOLDER FOLEY CATH W/STRAP (MISCELLANEOUS) ×2 IMPLANT
HOOD PEEL AWAY FLYTE STAYCOOL (MISCELLANEOUS) ×6 IMPLANT
INSERT KNEE TIB BRG SZ 6 (Insert) ×1 IMPLANT
JET LAVAGE IRRISEPT WOUND (IRRIGATION / IRRIGATOR)
KIT TURNOVER KIT A (KITS) IMPLANT
KNEE PATELLA ASYMMETRIC 10X35 (Knees) ×1 IMPLANT
KNEE TIBIAL COMPONENT SZ6 (Knees) ×1 IMPLANT
LAVAGE JET IRRISEPT WOUND (IRRIGATION / IRRIGATOR) IMPLANT
MARKER SKIN DUAL TIP RULER LAB (MISCELLANEOUS) ×2 IMPLANT
NDL SAFETY ECLIPSE 18X1.5 (NEEDLE) ×1 IMPLANT
NDL SPNL 18GX3.5 QUINCKE PK (NEEDLE) ×1 IMPLANT
NEEDLE HYPO 18GX1.5 SHARP (NEEDLE) ×2
NEEDLE SPNL 18GX3.5 QUINCKE PK (NEEDLE) ×2 IMPLANT
NS IRRIG 1000ML POUR BTL (IV SOLUTION) ×2 IMPLANT
PACK TOTAL KNEE CUSTOM (KITS) ×2 IMPLANT
PADDING CAST COTTON 6X4 STRL (CAST SUPPLIES) ×2 IMPLANT
PIN FLUTED HEDLESS FIX 3.5X1/8 (PIN) ×2 IMPLANT
PROTECTOR NERVE ULNAR (MISCELLANEOUS) ×2 IMPLANT
SAW OSC TIP CART 19.5X105X1.3 (SAW) ×2 IMPLANT
SEALER BIPOLAR AQUA 6.0 (INSTRUMENTS) ×2 IMPLANT
SET HNDPC FAN SPRY TIP SCT (DISPOSABLE) ×1 IMPLANT
SET PAD KNEE POSITIONER (MISCELLANEOUS) ×2 IMPLANT
SUT MNCRL AB 3-0 PS2 18 (SUTURE) ×2 IMPLANT
SUT MNCRL AB 4-0 PS2 18 (SUTURE) ×2 IMPLANT
SUT MON AB 2-0 CT1 36 (SUTURE) ×2 IMPLANT
SUT STRATAFIX PDO 1 14 VIOLET (SUTURE) ×2
SUT STRATFX PDO 1 14 VIOLET (SUTURE) ×1
SUT VIC AB 1 CTX 36 (SUTURE) ×4
SUT VIC AB 1 CTX36XBRD ANBCTR (SUTURE) ×2 IMPLANT
SUT VIC AB 2-0 CT1 27 (SUTURE) ×2
SUT VIC AB 2-0 CT1 TAPERPNT 27 (SUTURE) ×1 IMPLANT
SUTURE STRATFX PDO 1 14 VIOLET (SUTURE) ×1 IMPLANT
TRAY FOLEY MTR SLVR 16FR STAT (SET/KITS/TRAYS/PACK) IMPLANT
TRI CRUC RET FEM SZ7 KNEE (Orthopedic Implant) ×2 IMPLANT
TUBE SUCTION HIGH CAP CLEAR NV (SUCTIONS) ×2 IMPLANT
WATER STERILE IRR 1000ML POUR (IV SOLUTION) ×4 IMPLANT
WRAP KNEE MAXI GEL POST OP (GAUZE/BANDAGES/DRESSINGS) ×1 IMPLANT

## 2021-10-31 NOTE — Anesthesia Procedure Notes (Signed)
Procedure Name: MAC Date/Time: 10/31/2021 2:49 PM Performed by: Eben Burow, CRNA Pre-anesthesia Checklist: Patient identified, Emergency Drugs available, Suction available, Patient being monitored and Timeout performed Oxygen Delivery Method: Simple face mask Placement Confirmation: positive ETCO2

## 2021-10-31 NOTE — Op Note (Signed)
OPERATIVE REPORT  SURGEON: Rod Can, MD   ASSISTANT: Cherlynn June, PA-C  PREOPERATIVE DIAGNOSIS: Right knee arthritis.   POSTOPERATIVE DIAGNOSIS: Right knee arthritis.   PROCEDURE: Right total knee arthroplasty.   IMPLANTS: Stryker Triathlon CR femur, size 7. Stryker Tritanium tibia, size 6. X3 polyethelyene insert, size 9 mm, CR. 3 button asymmetric patella, size 35 mm.  ANESTHESIA:  MAC, Regional, and Spinal  TOURNIQUET TIME: Not utilized.   ESTIMATED BLOOD LOSS:-550 mL    ANTIBIOTICS: 2g Ancef.  DRAINS: None.  COMPLICATIONS: None   CONDITION: PACU - hemodynamically stable.   BRIEF CLINICAL NOTE: Austin Oliver is a 65 y.o. male with a long-standing history of Right knee arthritis. After failing conservative management, the patient was indicated for total knee arthroplasty. The risks, benefits, and alternatives to the procedure were explained, and the patient elected to proceed.  PROCEDURE IN DETAIL: Adductor canal block was obtained in the pre-op holding area. Once inside the operative room, spinal anesthesia was obtained, and a foley catheter was inserted. The patient was then positioned, a nonsterile tourniquet was placed, and the lower extremity was prepped and draped in the normal sterile surgical fashion.  A time-out was called verifying side and site of surgery. The patient received IV antibiotics within 60 minutes of beginning the procedure. The tourniquet was not utilized.   An anterior approach to the knee was performed utilizing a midvastus arthrotomy. A medial release was performed and the patellar fat pad was excised. Stryker imageless navigation was used to cut the distal femur perpendicular to the mechanical axis. A freehand patellar resection was performed, and the patella was sized and prepared with 3 lug holes.  Nagivation was used to make a neutral  proximal tibia resection, taking 9 mm of bone from the less affected lateral side with 3 degrees of slope. The menisci were excised. A spacer block was placed, and the alignment and balance in extension were confirmed.   The distal femur was sized using the 3-degree external rotation guide referencing the posterior femoral cortex. The appropriate 4-in-1 cutting block was pinned into place. Rotation was checked using Whiteside's line, the epicondylar axis, and then confirmed with a spacer block in flexion. The remaining femoral cuts were performed, taking care to protect the MCL.  The tibia was sized and the trial tray was pinned into place. The remaining trail components were inserted. The knee was stable to varus and valgus stress through a full range of motion. The patella tracked centrally, and the PCL was well balanced. The trial components were removed, and the proximal tibial surface was prepared. Final components were impacted into place. The knee was tested for a final time and found to be well balanced.   The wound was copiously irrigated with Irrisept solution and normal saline using pule lavage.  Marcaine solution was injected into the periarticular soft tissue.  The wound was closed in layers using #1 Vicryl and Stratafix for the fascia, 2-0 Vicryl for the subcutaneous fat, 2-0 Monocryl for the deep dermal layer, and skin staples. Dermabond was applied to the skin.  Once the glue was fully dried, an Aquacell Ag and compressive dressing were applied.  Tthe patient was transported to the recovery room in stable condition.  Sponge, needle, and instrument counts were correct at the end of the case x2.  The patient tolerated the procedure well and there were no known complications.  Please note that a surgical assistant was a medical necessity for this procedure in order to  perform it in a safe and expeditious manner. Surgical assistant was necessary to retract the ligaments and vital neurovascular  structures to prevent injury to them and also necessary for proper positioning of the limb to allow for anatomic placement of the prosthesis.

## 2021-10-31 NOTE — Interval H&P Note (Signed)
History and Physical Interval Note:  10/31/2021 12:47 PM  Austin Oliver  has presented today for surgery, with the diagnosis of Right knee osteoarthritis.  The various methods of treatment have been discussed with the patient and family. After consideration of risks, benefits and other options for treatment, the patient has consented to  Procedure(s): COMPUTER ASSISTED TOTAL KNEE ARTHROPLASTY (Right) as a surgical intervention.  The patient's history has been reviewed, patient examined, no change in status, stable for surgery.  I have reviewed the patient's chart and labs.  Questions were answered to the patient's satisfaction.     Hilton Cork Sharvil Hoey

## 2021-10-31 NOTE — Transfer of Care (Signed)
Immediate Anesthesia Transfer of Care Note  Patient: Austin Oliver  Procedure(s) Performed: COMPUTER ASSISTED TOTAL KNEE ARTHROPLASTY (Right: Knee)  Patient Location: PACU Anesthesia Type:MAC and Spinal  Level of Consciousness: awake, alert , oriented and patient cooperative  Airway & Oxygen Therapy: Patient Spontanous Breathing and Patient connected to face mask oxygen  Post-op Assessment: Report given to RN and Post -op Vital signs reviewed and stable  Post vital signs: Reviewed and stable  Last Vitals:  Vitals Value Taken Time  BP 91/61 10/31/21 1638  Temp    Pulse 59 10/31/21 1640  Resp 16 10/31/21 1640  SpO2 100 % 10/31/21 1640  Vitals shown include unvalidated device data.  Last Pain:  Vitals:   10/31/21 1225  TempSrc:   PainSc: 0-No pain      Patients Stated Pain Goal: 3 (63/87/56 4332)  Complications: No notable events documented.

## 2021-10-31 NOTE — Anesthesia Procedure Notes (Signed)
Spinal  Patient location during procedure: OR Start time: 10/31/2021 1:52 PM Reason for block: surgical anesthesia Staffing Performed: resident/CRNA  Anesthesiologist: Duane Boston, MD Resident/CRNA: Eben Burow, CRNA Preanesthetic Checklist Completed: patient identified, IV checked, site marked, risks and benefits discussed, surgical consent, monitors and equipment checked, pre-op evaluation and timeout performed Spinal Block Patient position: sitting Prep: DuraPrep and site prepped and draped Patient monitoring: heart rate, cardiac monitor, continuous pulse ox and blood pressure Approach: midline Location: L3-4 Injection technique: single-shot Needle Needle type: Pencan  Needle gauge: 24 G Needle length: 10 cm Assessment Sensory level: T4 Events: CSF return Additional Notes Pt placed in sitting position, spinal kit expiration date checked and verified, + CSF, - heme, pt tolerated well. Adequate sensory level. Dr Tobias Alexander present and supervising throughout SAB placement.

## 2021-10-31 NOTE — Anesthesia Procedure Notes (Addendum)
Anesthesia Regional Block: Adductor canal block   Pre-Anesthetic Checklist: , timeout performed,  Correct Patient, Correct Site, Correct Laterality,  Correct Procedure, Correct Position, site marked,  Risks and benefits discussed,  Surgical consent,  Pre-op evaluation,  At surgeon's request and post-op pain management  Laterality: Right  Prep: chloraprep       Needles:  Injection technique: Single-shot  Needle Type: Stimulator Needle - 80     Needle Length: 10cm  Needle Gauge: 21     Additional Needles:   Procedures:,,,, ultrasound used (permanent image in chart),,    Narrative:  Start time: 10/31/2021 12:22 PM End time: 10/31/2021 12:32 PM Injection made incrementally with aspirations every 5 mL.  Performed by: Personally  Anesthesiologist: Duane Boston, MD

## 2021-10-31 NOTE — Plan of Care (Signed)
  Problem: Activity: Goal: Ability to avoid complications of mobility impairment will improve Outcome: Progressing   Problem: Clinical Measurements: Goal: Postoperative complications will be avoided or minimized Outcome: Progressing   Problem: Pain Management: Goal: Pain level will decrease with appropriate interventions Outcome: Progressing   Problem: Skin Integrity: Goal: Will show signs of wound healing Outcome: Progressing   

## 2021-10-31 NOTE — Progress Notes (Signed)
Placed patient on CPAP for the night via auto-mode. Patient provided his home mask.

## 2021-10-31 NOTE — Progress Notes (Signed)
Assisted Dr. Singer with right, ultrasound guided, adductor canal block. Side rails up, monitors on throughout procedure. See vital signs in flow sheet. Tolerated Procedure well.  

## 2021-10-31 NOTE — Anesthesia Postprocedure Evaluation (Signed)
Anesthesia Post Note  Patient: Austin Oliver  Procedure(s) Performed: COMPUTER ASSISTED TOTAL KNEE ARTHROPLASTY (Right: Knee)     Patient location during evaluation: PACU Anesthesia Type: Spinal Level of consciousness: awake and alert Pain management: pain level controlled Vital Signs Assessment: post-procedure vital signs reviewed and stable Respiratory status: spontaneous breathing and respiratory function stable Cardiovascular status: blood pressure returned to baseline and stable Postop Assessment: spinal receding Anesthetic complications: no   No notable events documented.  Last Vitals:  Vitals:   10/31/21 1715 10/31/21 1730  BP: 122/62 101/79  Pulse: 62 (!) 59  Resp: 14 14  Temp:    SpO2: 97% 97%    Last Pain:  Vitals:   10/31/21 1730  TempSrc:   PainSc: 0-No pain                 Corynn Solberg DANIEL

## 2021-10-31 NOTE — Anesthesia Preprocedure Evaluation (Addendum)
Anesthesia Evaluation  Patient identified by MRN, date of birth, ID band Patient awake    Reviewed: Allergy & Precautions, NPO status , Patient's Chart, lab work & pertinent test results  History of Anesthesia Complications Negative for: history of anesthetic complications  Airway Mallampati: II  TM Distance: >3 FB Neck ROM: Full    Dental no notable dental hx. (+) Dental Advisory Given   Pulmonary sleep apnea , COPD, former smoker,    Pulmonary exam normal        Cardiovascular hypertension, Pt. on medications + CAD  Normal cardiovascular exam+ Cardiac Defibrillator   Echo 10/02/2020 1. Left ventricular ejection fraction, by estimation, is 50 to 55%. The  left ventricle has low normal function. The left ventricle has no regional  wall motion abnormalities. The left ventricular internal cavity size was  mildly dilated. Left ventricular  diastolic parameters were normal.  2. Right ventricular systolic function is normal. The right ventricular  size is normal. Tricuspid regurgitation signal is inadequate for assessing  PA pressure.  3. Left atrial size was mildly dilated.  4. The mitral valve is normal in structure. No evidence of mitral valve  regurgitation.  5. The aortic valve is tricuspid. There is mild calcification of the  aortic valve. There is mild thickening of the aortic valve. Aortic valve  regurgitation is trivial. Mild aortic valve sclerosis is present, with no  evidence of aortic valve stenosis.  6. The inferior vena cava is normal in size with greater than 50%  respiratory variability, suggesting right atrial pressure of 3 mmHg.   Myocardial Perfusion 08/06/2020 ? Nuclear stress EF: 57%. ? There was no ST segment deviation noted during stress. ? The study is normal. ? This is a low risk study. ? The left ventricular ejection fraction is normal (55-65%).  Normal pharmacologic nuclear stress test with  no evidence for prior infarct or ischemia. LVEF 57%. Left ventricle appears mildly dilated.   Conclusions: 1. Mild to moderate, non-obstructive coronary artery disease, as detailed  below. Overall appearance is similar to completion of last year's  catheterization. LAD, D3, and RCA/rPLA disease is not hemodynamically  significant by RFR. 2. Widely patent ostial/proximal LAD stent. 3. Mildly elevated left ventricular filling pressure (LVEDP 15-20 mmHg).  Recommendations: 1. Continue aggressive secondary prevention of coronary artery disease. 2. Consider workup for alternative etiologies of persistent atypical chest  pain and fatigue.    Neuro/Psych negative neurological ROS     GI/Hepatic Neg liver ROS, GERD  ,  Endo/Other  Hypothyroidism   Renal/GU negative Renal ROS     Musculoskeletal negative musculoskeletal ROS (+)   Abdominal   Peds  Hematology negative hematology ROS (+) anemia ,   Anesthesia Other Findings   Reproductive/Obstetrics                            Anesthesia Physical Anesthesia Plan  ASA: 3  Anesthesia Plan: Spinal   Post-op Pain Management: Regional block and Celebrex PO (pre-op)   Induction:   PONV Risk Score and Plan: 1 and Ondansetron and Propofol infusion  Airway Management Planned: Natural Airway and Simple Face Mask  Additional Equipment:   Intra-op Plan:   Post-operative Plan:   Informed Consent: I have reviewed the patients History and Physical, chart, labs and discussed the procedure including the risks, benefits and alternatives for the proposed anesthesia with the patient or authorized representative who has indicated his/her understanding and acceptance.  Dental advisory given  Plan Discussed with: Anesthesiologist and CRNA  Anesthesia Plan Comments:       Anesthesia Quick Evaluation

## 2021-10-31 NOTE — Discharge Instructions (Signed)
 Dr.  Paone Total Joint Specialist [facility] [address] [place], [place] [address] [phone]  TOTAL KNEE REPLACEMENT POSTOPERATIVE DIRECTIONS    Knee Rehabilitation, Guidelines Following Surgery  Results after knee surgery are often greatly improved when you follow the exercise, range of motion and muscle strengthening exercises prescribed by your doctor. Safety measures are also important to protect the knee from further injury. Any time any of these exercises cause you to have increased pain or swelling in your knee joint, decrease the amount until you are comfortable again and slowly increase them. If you have problems or questions, call your caregiver or physical therapist for advice.   WEIGHT BEARING Weight bearing as tolerated with assist device (walker, cane, etc) as directed, use it as long as suggested by your surgeon or therapist, typically at least 4-6 weeks.  HOME CARE INSTRUCTIONS  Remove items at home which could result in a fall. This includes throw rugs or furniture in walking pathways.  Continue medications as instructed at time of discharge. You may have some home medications which will be placed on hold until you complete the course of blood thinner medication.  You may start showering once you are discharged home but do not submerge the incision under water. Just pat the incision dry and apply a dry gauze dressing on daily. Walk with walker as instructed.  You may resume a sexual relationship in one month or when given the OK by your doctor.  Use walker as long as suggested by your caregivers. Avoid periods of inactivity such as sitting longer than an hour when not asleep. This helps prevent blood clots.  You may put full weight on your legs and walk as much as is comfortable.  You may return to work once you are cleared by your doctor.  Do not drive a car for 6 weeks or until released by you surgeon.  Do not drive while  taking narcotics.  Wear the elastic stockings for three weeks following surgery during the day but you may remove then at night. Make sure you keep all of your appointments after your operation with all of your doctors and caregivers. You should call the office at the above phone number and make an appointment for approximately two weeks after the date of your surgery. Do not remove your surgical dressing. The dressing is waterproof; you may take showers in 3 days, but do not take tub baths or submerge the dressing. Please pick up a stool softener and laxative for home use as long as you are requiring pain medications. ICE to the affected knee every three hours for 30 minutes at a time and then as needed for pain and swelling.  Continue to use ice on the knee for pain and swelling from surgery. You may notice swelling that will progress down to the foot and ankle.  This is normal after surgery.  Elevate the leg when you are not up walking on it.   It is important for you to complete the blood thinner medication as prescribed by your doctor. Continue to use the breathing machine which will help keep your temperature down.  It is common for your temperature to cycle up and down following surgery, especially at night when you are not up moving around and exerting yourself.  The breathing machine keeps your lungs expanded and your temperature down.  RANGE OF MOTION AND STRENGTHENING EXERCISES  Rehabilitation of the knee is important following a knee injury or an   operation. After just a few days of immobilization, the muscles of the thigh which control the knee become weakened and shrink (atrophy). Knee exercises are designed to build up the tone and strength of the thigh muscles and to improve knee motion. Often times heat used for twenty to thirty minutes before working out will loosen up your tissues and help with improving the range of motion but do not use heat for the first two weeks following surgery.  These exercises can be done on a training (exercise) mat, on the floor, on a table or on a bed. Use what ever works the best and is most comfortable for you Knee exercises include:  Leg Lifts - While your knee is still immobilized in a splint or cast, you can do straight leg raises. Lift the leg to 60 degrees, hold for 3 sec, and slowly lower the leg. Repeat 10-20 times 2-3 times daily. Perform this exercise against resistance later as your knee gets better.  Quad and Hamstring Sets - Tighten up the muscle on the front of the thigh (Quad) and hold for 5-10 sec. Repeat this 10-20 times hourly. Hamstring sets are done by pushing the foot backward against an object and holding for 5-10 sec. Repeat as with quad sets.  A rehabilitation program following serious knee injuries can speed recovery and prevent re-injury in the future due to weakened muscles. Contact your doctor or a physical therapist for more information on knee rehabilitation.   POST-OPERATIVE OPIOID TAPER INSTRUCTIONS: It is important to wean off of your opioid medication as soon as possible. If you do not need pain medication after your surgery it is ok to stop day one. Opioids include: Codeine, Hydrocodone(Norco, Vicodin), Oxycodone(Percocet, oxycontin) and hydromorphone amongst others.  Long term and even short term use of opiods can cause: Increased pain response Dependence Constipation Depression Respiratory depression And more.  Withdrawal symptoms can include Flu like symptoms Nausea, vomiting And more Techniques to manage these symptoms Hydrate well Eat regular healthy meals Stay active Use relaxation techniques(deep breathing, meditating, yoga) Do Not substitute Alcohol to help with tapering If you have been on opioids for less than two weeks and do not have pain than it is ok to stop all together.  Plan to wean off of opioids This plan should start within one week post op of your joint replacement. Maintain the same  interval or time between taking each dose and first decrease the dose.  Cut the total daily intake of opioids by one tablet each day Next start to increase the time between doses. The last dose that should be eliminated is the evening dose.    SKILLED REHAB INSTRUCTIONS: If the patient is transferred to a skilled rehab facility following release from the hospital, a list of the current medications will be sent to the facility for the patient to continue.  When discharged from the skilled rehab facility, please have the facility set up the patient's Home Health Physical Therapy prior to being released. Also, the skilled facility will be responsible for providing the patient with their medications at time of release from the facility to include their pain medication, the muscle relaxants, and their blood thinner medication. If the patient is still at the rehab facility at time of the two week follow up appointment, the skilled rehab facility will also need to assist the patient in arranging follow up appointment in our office and any transportation needs.  MAKE SURE YOU:  Understand these instructions.  Will watch   your condition.  Will get help right away if you are not doing well or get worse.    Pick up stool softner and laxative for home use following surgery while on pain medications. Do NOT remove your dressing. You may shower.  Do not take tub baths or submerge incision under water. May shower starting three days after surgery. Please use a clean towel to pat the incision dry following showers. Continue to use ice for pain and swelling after surgery. Do not use any lotions or creams on the incision until instructed by your surgeon.  

## 2021-11-01 ENCOUNTER — Encounter (HOSPITAL_COMMUNITY): Payer: Self-pay | Admitting: Orthopedic Surgery

## 2021-11-01 DIAGNOSIS — M1711 Unilateral primary osteoarthritis, right knee: Secondary | ICD-10-CM | POA: Diagnosis not present

## 2021-11-01 LAB — CBC
HCT: 27.9 % — ABNORMAL LOW (ref 39.0–52.0)
Hemoglobin: 9.1 g/dL — ABNORMAL LOW (ref 13.0–17.0)
MCH: 31.9 pg (ref 26.0–34.0)
MCHC: 32.6 g/dL (ref 30.0–36.0)
MCV: 97.9 fL (ref 80.0–100.0)
Platelets: 88 10*3/uL — ABNORMAL LOW (ref 150–400)
RBC: 2.85 MIL/uL — ABNORMAL LOW (ref 4.22–5.81)
RDW: 13.5 % (ref 11.5–15.5)
WBC: 6.1 10*3/uL (ref 4.0–10.5)
nRBC: 0 % (ref 0.0–0.2)

## 2021-11-01 LAB — BASIC METABOLIC PANEL
Anion gap: 9 (ref 5–15)
BUN: 20 mg/dL (ref 8–23)
CO2: 23 mmol/L (ref 22–32)
Calcium: 8.6 mg/dL — ABNORMAL LOW (ref 8.9–10.3)
Chloride: 105 mmol/L (ref 98–111)
Creatinine, Ser: 1.34 mg/dL — ABNORMAL HIGH (ref 0.61–1.24)
GFR, Estimated: 58 mL/min — ABNORMAL LOW (ref >60)
Glucose, Bld: 318 mg/dL — ABNORMAL HIGH (ref 70–99)
Potassium: 4.3 mmol/L (ref 3.5–5.1)
Sodium: 137 mmol/L (ref 135–145)

## 2021-11-01 MED ORDER — DOCUSATE SODIUM 100 MG PO CAPS: 100.0000 mg | ORAL_CAPSULE | Freq: Two times a day (BID) | ORAL | 0 refills | Status: DC

## 2021-11-01 MED ORDER — SENNA 8.6 MG PO TABS: 1.0000 | ORAL_TABLET | Freq: Two times a day (BID) | ORAL | 0 refills | Status: DC

## 2021-11-01 MED ORDER — ACETAMINOPHEN 325 MG PO TABS: 325.0000 mg | ORAL_TABLET | Freq: Four times a day (QID) | ORAL | Status: AC | PRN

## 2021-11-01 MED ORDER — ASPIRIN 81 MG PO CHEW: 81.0000 mg | CHEWABLE_TABLET | Freq: Two times a day (BID) | ORAL | 1 refills | Status: DC

## 2021-11-01 MED ORDER — OXYCODONE HCL ER 10 MG PO T12A: EXTENDED_RELEASE_TABLET | ORAL | 0 refills | Status: AC

## 2021-11-01 MED ORDER — FINASTERIDE 5 MG PO TABS: 5.0000 mg | ORAL_TABLET | Freq: Every day | ORAL | Status: DC

## 2021-11-01 MED ORDER — ONDANSETRON HCL 4 MG PO TABS: 4.0000 mg | ORAL_TABLET | Freq: Four times a day (QID) | ORAL | 0 refills | Status: DC | PRN

## 2021-11-01 MED ORDER — OXYCODONE HCL 5 MG PO TABS: 5.0000 mg | ORAL_TABLET | ORAL | 0 refills | Status: DC | PRN

## 2021-11-01 NOTE — Evaluation (Signed)
Physical Therapy Evaluation Patient Details Name: Austin Oliver MRN: [id number] DOB: [birth year] Today's Date: 11/01/2021  History of Present Illness  Pt s/p R TKR and iwth hx of L THR, COPD and AICD  Clinical Impression  Pt s/p R TKR and presents with decreased R LE strength/ROM and post op pain limiting functional mobility.  Pt should progress to dc home with family assist and reports first OP PT scheduled for 11/04/21.     Recommendations for follow up therapy are one component of a multi-disciplinary discharge planning process, led by the attending physician.  Recommendations may be updated based on patient status, additional functional criteria and insurance authorization.  Follow Up Recommendations Follow physician's recommendations for discharge plan and follow up therapies    Assistance Recommended at Discharge Frequent or constant Supervision/Assistance  Functional Status Assessment Patient has had a recent decline in their functional status and demonstrates the ability to make significant improvements in function in a reasonable and predictable amount of time.  Equipment Recommendations  None recommended by PT    Recommendations for Other Services       Precautions / Restrictions Precautions Precautions: Fall;Knee Restrictions Weight Bearing Restrictions: No RLE Weight Bearing: Weight bearing as tolerated      Mobility  Bed Mobility Overal bed mobility: Needs Assistance Bed Mobility: Supine to Sit     Supine to sit: Min guard     General bed mobility comments: INcreased time with min guard for R LE control    Transfers Overall transfer level: Needs assistance Equipment used: Rolling walker (2 wheels) Transfers: Sit to/from Stand Sit to Stand: Min assist;Min guard           General transfer comment: cues for LE management and use of UEs to self assist    Ambulation/Gait Ambulation/Gait assistance: Min assist;Min guard Gait Distance (Feet): 75  Feet Assistive device: Rolling walker (2 wheels) Gait Pattern/deviations: Step-to pattern;Decreased step length - right;Decreased stance time - right;Trunk flexed Gait velocity: decr     General Gait Details: cues for sequence, posture and position from [employer]            Wheelchair Mobility    Modified Rankin (Stroke Patients Only)       Balance Overall balance assessment: Needs assistance Sitting-balance support: No upper extremity supported;Feet supported Sitting balance-Leahy Scale: Good     Standing balance support: Bilateral upper extremity supported Standing balance-Leahy Scale: Poor                               Pertinent Vitals/Pain Pain Assessment: 0-10 Pain Score: 3  Pain Location: R knee Pain Descriptors / Indicators: Aching;Sore Pain Intervention(s): Limited activity within patient's tolerance;Monitored during session;Premedicated before session;Ice applied    Home Living Family/patient expects to be discharged to:: Private residence Living Arrangements: Spouse/significant other Available Help at Discharge: Family Type of Home: House Home Access: Stairs to enter Entrance Stairs-Rails: None Entrance Stairs-Number of Steps: 1   Home Layout: One level Home Equipment: Conservation officer, nature (2 wheels);BSC/3in1;Cane - single point      Prior Function Prior Level of Function : Independent/Modified Independent                     Hand Dominance   Dominant Hand: Right    Extremity/Trunk Assessment   Upper Extremity Assessment Upper Extremity Assessment: Overall WFL for tasks assessed    Lower Extremity Assessment Lower Extremity Assessment: RLE deficits/detail  RLE Deficits / Details: 3/5 quads with IND SLR and AAROM -5 - 65 at R knee    Cervical / Trunk Assessment Cervical / Trunk Assessment: Normal  Communication   Communication: HOH  Cognition Arousal/Alertness: Awake/alert Behavior During Therapy: WFL for tasks  assessed/performed Overall Cognitive Status: Within Functional Limits for tasks assessed                                          General Comments      Exercises Total Joint Exercises Ankle Circles/Pumps: AROM;Both;15 reps;Supine Quad Sets: AROM;Both;10 reps;Supine Heel Slides: AAROM;Right;15 reps;Supine Hip ABduction/ADduction: AAROM;AROM;Right;10 reps;Supine Straight Leg Raises: AAROM;AROM;Right;15 reps;Supine Long Arc Quad: AROM;Right;10 reps;Seated   Assessment/Plan    PT Assessment Patient needs continued PT services  PT Problem List Decreased strength;Decreased range of motion;Decreased activity tolerance;Decreased balance;Decreased mobility;Decreased knowledge of use of DME;Pain;Obesity       PT Treatment Interventions DME instruction;Gait training;Stair training;Functional mobility training;Therapeutic activities;Therapeutic exercise;Patient/family education    PT Goals (Current goals can be found in the Care Plan section)  Acute Rehab PT Goals Patient Stated Goal: Regain IND PT Goal Formulation: With patient Time For Goal Achievement: 11/08/21 Potential to Achieve Goals: Good    Frequency 7X/week   Barriers to discharge        Co-evaluation               AM-PAC PT "6 Clicks" Mobility  Outcome Measure Help needed turning from your back to your side while in a flat bed without using bedrails?: A Little Help needed moving from lying on your back to sitting on the side of a flat bed without using bedrails?: A Little Help needed moving to and from a bed to a chair (including a wheelchair)?: A Little Help needed standing up from a chair using your arms (e.g., wheelchair or bedside chair)?: A Little Help needed to walk in hospital room?: A Little Help needed climbing 3-5 steps with a railing? : A Little 6 Click Score: 18    End of Session Equipment Utilized During Treatment: Gait belt Activity Tolerance: Patient tolerated treatment  well Patient left: in chair;with call bell/phone within reach;with chair alarm set;with family/visitor present Nurse Communication: Mobility status PT Visit Diagnosis: Difficulty in walking, not elsewhere classified (R26.2)    Time: 2992-4268 PT Time Calculation (min) (ACUTE ONLY): 45 min   Charges:   PT Evaluation $PT Eval Low Complexity: 1 Low PT Treatments $Gait Training: 8-22 mins $Therapeutic Exercise: 8-22 mins        [place] Pager [phone] Office [phone]   Dezire Turk 11/01/2021, 2:00 PM

## 2021-11-01 NOTE — TOC Transition Note (Signed)
Transition of Care [facility]) - CM/SW Discharge Note  Patient Details  Name: Austin Oliver MRN: [id number] Date of Birth: [birth year]  [facility] of [facility] CM/SW Contact:  Sherie Don, LCSW Phone Number: 11/01/2021, 9:27 AM  Clinical Narrative: Patient is expected to discharge home after working with PT. CSW met with patient and his wife to confirm discharge plan. Patient will discharge home with OPPT at [facility]. Patient has a rolling walker and 3N1 at home, so there are no DME needs at this time. TOC signing off.  Final next level of care: OP Rehab Barriers to Discharge: No Barriers Identified  Patient Goals and CMS Choice Patient states their goals for this hospitalization and ongoing recovery are:: Discharge home with OPPT at [facility] Choice offered to / list presented to : NA  Discharge Plan and Services        DME Arranged: N/A DME Agency: NA  Readmission Risk Interventions No flowsheet data found.

## 2021-11-01 NOTE — Progress Notes (Signed)
Provided discharge education/instructions, all questions and concerns addressed, Pt not in acute distress, discharged home with belongings accompanied by wife.

## 2021-11-01 NOTE — Discharge Summary (Signed)
Physician Discharge Summary  Patient ID: Austin Oliver MRN: [id number] DOB/AGE: 03/10/1956 65 y.o.  Admit date: 10/31/2021 Discharge date: 11/01/2021  Admission Diagnoses:  Osteoarthritis of right knee  Discharge Diagnoses:  Principal Problem:   Osteoarthritis of right knee   Past Medical History:  Diagnosis Date   AICD (automatic cardioverter/defibrillator) present    for sick sinus syndrome   Cirrhosis of liver (HCC)    COPD (chronic obstructive pulmonary disease) ([place])    Coronary artery calcification seen on CAT scan 01/14/2017   Essential hypertension 09/11/2020   GERD (gastroesophageal reflux disease)    History of kidney stones    Hyperlipidemia    Hypothyroidism    OSA on CPAP    Mild with AHI 7.8/hr >>on CPAP   Osteoarthritis    Shingles 2012    Surgeries: Procedure(s): COMPUTER ASSISTED TOTAL KNEE ARTHROPLASTY on 10/31/2021   Consultants (if any):   Discharged Condition: Improved  Hospital Course: Austin Oliver is an 65 y.o. male who was admitted 10/31/2021 with a diagnosis of Osteoarthritis of right knee and went to the operating room on 10/31/2021 and underwent the above named procedures.    He was given perioperative antibiotics:  Anti-infectives (From admission, onward)    Start     Dose/Rate Route Frequency Ordered Stop   10/31/21 2000  ceFAZolin (ANCEF) IVPB 2g/100 mL premix        2 g 200 mL/hr over 30 Minutes Intravenous Every 6 hours 10/31/21 1747 11/01/21 0144   10/31/21 1100  ceFAZolin (ANCEF) IVPB 2g/100 mL premix        2 g 200 mL/hr over 30 Minutes Intravenous On call to O.R. 10/31/21 1048 10/31/21 1403     .  He was given sequential compression devices, early ambulation, and aspirin for DVT prophylaxis.  He benefited maximally from the hospital stay and there were no complications.    Recent vital signs:  Vitals:   11/01/21 0536 11/01/21 0727  BP: (!) 90/53 94/63  Pulse: 65 60  Resp: 16 15  Temp: 98.7 F (37.1 C) 98.1 F  (36.7 C)  SpO2: 95% 97%    Recent laboratory studies:  Lab Results  Component Value Date   HGB 9.1 (L) 11/01/2021   HGB 11.2 (L) 10/22/2021   HGB 11.1 (L) 09/25/2021   Lab Results  Component Value Date   WBC 6.1 11/01/2021   PLT 88 (L) 11/01/2021   Lab Results  Component Value Date   INR 1.1 10/22/2021   Lab Results  Component Value Date   NA 137 11/01/2021   K 4.3 11/01/2021   CL 105 11/01/2021   CO2 23 11/01/2021   BUN 20 11/01/2021   CREATININE 1.34 (H) 11/01/2021   GLUCOSE 318 (H) 11/01/2021     WEIGHT BEARING   Weight bearing as tolerated with assist device (walker, cane, etc) as directed, use it as long as suggested by your surgeon or therapist, typically at least 4-6 weeks.   EXERCISES  Results after joint replacement surgery are often greatly improved when you follow the exercise, range of motion and muscle strengthening exercises prescribed by your doctor. Safety measures are also important to protect the joint from further injury. Any time any of these exercises cause you to have increased pain or swelling, decrease what you are doing until you are comfortable again and then slowly increase them. If you have problems or questions, call your caregiver or physical therapist for advice.   Rehabilitation is important following a joint  replacement. After just a few days of immobilization, the muscles of the leg can become weakened and shrink (atrophy).  These exercises are designed to build up the tone and strength of the thigh and leg muscles and to improve motion. Often times heat used for twenty to thirty minutes before working out will loosen up your tissues and help with improving the range of motion but do not use heat for the first two weeks following surgery (sometimes heat can increase post-operative swelling).   These exercises can be done on a training (exercise) mat, on the floor, on a table or on a bed. Use whatever works the best and is most comfortable  for you.    Use music or television while you are exercising so that the exercises are a pleasant break in your day. This will make your life better with the exercises acting as a break in your routine that you can look forward to.   Perform all exercises about fifteen times, three times per day or as directed.  You should exercise both the operative leg and the other leg as well.  Exercises include:   Quad Sets - Tighten up the muscle on the front of the thigh (Quad) and hold for 5-10 seconds.   Straight Leg Raises - With your knee straight (if you were given a brace, keep it on), lift the leg to 60 degrees, hold for 3 seconds, and slowly lower the leg.  Perform this exercise against resistance later as your leg gets stronger.  Leg Slides: Lying on your back, slowly slide your foot toward your buttocks, bending your knee up off the floor (only go as far as is comfortable). Then slowly slide your foot back down until your leg is flat on the floor again.  Angel Wings: Lying on your back spread your legs to the side as far apart as you can without causing discomfort.  Hamstring Strength:  Lying on your back, push your heel against the floor with your leg straight by tightening up the muscles of your buttocks.  Repeat, but this time bend your knee to a comfortable angle, and push your heel against the floor.  You may put a pillow under the heel to make it more comfortable if necessary.   A rehabilitation program following joint replacement surgery can speed recovery and prevent re-injury in the future due to weakened muscles. Contact your doctor or a physical therapist for more information on knee rehabilitation.    CONSTIPATION  Constipation is defined medically as fewer than three stools per week and severe constipation as less than one stool per week.  Even if you have a regular bowel pattern at home, your normal regimen is likely to be disrupted due to multiple reasons following surgery.  Combination  of anesthesia, postoperative narcotics, change in appetite and fluid intake all can affect your bowels.   YOU MUST use at least one of the following options; they are listed in order of increasing strength to get the job done.  They are all available over the counter, and you may need to use some, POSSIBLY even all of these options:    Drink plenty of fluids (prune juice may be helpful) and high fiber foods Colace 100 mg by mouth twice a day  Senokot for constipation as directed and as needed Dulcolax (bisacodyl), take with full glass of water  Miralax (polyethylene glycol) once or twice a day as needed.  If you have tried all these things and are  unable to have a bowel movement in the first 3-4 days after surgery call either your surgeon or your primary doctor.    If you experience loose stools or diarrhea, hold the medications until you stool forms back up.  If your symptoms do not get better within 1 week or if they get worse, check with your doctor.  If you experience "the worst abdominal pain ever" or develop nausea or vomiting, please contact the office immediately for further recommendations for treatment.  Dental Antibiotics:  In most cases prophylactic antibiotics for Dental procdeures after total joint surgery are not necessary.  Exceptions are as follows:  1. History of prior total joint infection  2. Severely immunocompromised (Organ Transplant, cancer chemotherapy, Rheumatoid biologic meds such as Empire)  3. Poorly controlled diabetes (A1C &gt; 8.0, blood glucose over 200)  If you have one of these conditions, contact your surgeon for an antibiotic prescription, prior to your dental procedure.   POST-OPERATIVE OPIOID TAPER INSTRUCTIONS: It is important to wean off of your opioid medication as soon as possible. If you do not need pain medication after your surgery it is ok to stop day one. Opioids include: Codeine, Hydrocodone(Norco, Vicodin), Oxycodone(Percocet,  oxycontin) and hydromorphone amongst others.  Long term and even short term use of opiods can cause: Increased pain response Dependence Constipation Depression Respiratory depression And more.  Withdrawal symptoms can include Flu like symptoms Nausea, vomiting And more Techniques to manage these symptoms Hydrate well Eat regular healthy meals Stay active Use relaxation techniques(deep breathing, meditating, yoga) Do Not substitute Alcohol to help with tapering If you have been on opioids for less than two weeks and do not have pain than it is ok to stop all together.  Plan to wean off of opioids This plan should start within one week post op of your joint replacement. Maintain the same interval or time between taking each dose and first decrease the dose.  Cut the total daily intake of opioids by one tablet each day Next start to increase the time between doses. The last dose that should be eliminated is the evening dose.   ITCHING:  If you experience itching with your medications, try taking only a single pain pill, or even half a pain pill at a time.  You can also use Benadryl over the counter for itching or also to help with sleep.   TED HOSE STOCKINGS:  Use stockings on both legs until for at least 2 weeks or as directed by physician office. They may be removed at night for sleeping.  MEDICATIONS:  See your medication summary on the After Visit Summary that nursing will review with you.  You may have some home medications which will be placed on hold until you complete the course of blood thinner medication.  It is important for you to complete the blood thinner medication as prescribed.  PRECAUTIONS:  If you experience chest pain or shortness of breath - call 911 immediately for transfer to the hospital emergency department.   If you develop a fever greater that 101 F, purulent drainage from wound, increased redness or drainage from wound, foul odor from the wound/dressing, or  calf pain - CONTACT YOUR SURGEON.                                                   FOLLOW-UP  APPOINTMENTS:  If you do not already have a post-op appointment, please call the office for an appointment to be seen by your surgeon.  Guidelines for how soon to be seen are listed in your After Visit Summary, but are typically between 1-4 weeks after surgery.  OTHER INSTRUCTIONS:   Knee Replacement:  Do not place pillow under knee, focus on keeping the knee straight while resting. CPM instructions: 0-90 degrees, 2 hours in the morning, 2 hours in the afternoon, and 2 hours in the evening. Place foam block, curve side up under heel at all times except when in CPM or when walking.  DO NOT modify, tear, cut, or change the foam block in any way.   MAKE SURE YOU:  Understand these instructions.  Get help right away if you are not doing well or get worse.    Thank you for letting us be a part of your medical care team.  It is a privilege we respect greatly.  We hope these instructions will help you stay on track for a fast and full recovery!   Diagnostic Studies: DG Knee Right Port  Result Date: 10/31/2021 CLINICAL DATA:  Right knee replacement EXAM: PORTABLE RIGHT KNEE - 1-2 VIEW COMPARISON:  None. FINDINGS: Changes of right knee replacement. No hardware bony complicating feature. Soft tissue and joint space gas noted. IMPRESSION: Right knee replacement.  No visible complicating feature. Electronically Signed   By: Rolm Baptise M.D.   On: 10/31/2021 17:07   CT CHEST LUNG CA SCREEN LOW DOSE W/O CM  Result Date: 10/10/2021 CLINICAL DATA:  Fifty-three pack-year smoking history, quitting 4 years ago. EXAM: CT CHEST WITHOUT CONTRAST LOW-DOSE FOR LUNG CANCER SCREENING TECHNIQUE: Multidetector CT imaging of the chest was performed following the standard protocol without IV contrast. COMPARISON:  08/27/2020 FINDINGS: Cardiovascular: Aortic atherosclerosis. Normal heart size, without pericardial effusion.  Three vessel coronary artery calcification. Dual lead pacer Mediastinum/Nodes: No mediastinal or definite hilar adenopathy, given limitations of unenhanced CT. Lungs/Pleura: No pleural fluid. Mild centrilobular emphysema. Bilateral pulmonary nodules of maximally volume derived equivalent diameter 5.2 mm are primarily similar. Upper Abdomen: Advanced cirrhosis. Normal imaged portions of the spleen, stomach, pancreas, gallbladder, adrenal glands, kidneys. Musculoskeletal: No acute osseous abnormality. IMPRESSION: 1. Lung-RADS 2, benign appearance or behavior. Continue annual screening with low-dose chest CT without contrast in 12 months. 2. Aortic Atherosclerosis (ICD10-I70.0) and Emphysema (ICD10-J43.9). Coronary artery atherosclerosis. 3. Cirrhosis. Electronically Signed   By: Abigail Miyamoto M.D.   On: 10/10/2021 22:15    Disposition: Discharge disposition: 01-Home or Self Care       Discharge Instructions     Call MD / Call 911   Complete by: As directed    If you experience chest pain or shortness of breath, CALL 911 and be transported to the hospital emergency room.  If you develope a fever above 101 F, pus (white drainage) or increased drainage or redness at the wound, or calf pain, call your surgeon's office.   Constipation Prevention   Complete by: As directed    Drink plenty of fluids.  Prune juice may be helpful.  You may use a stool softener, such as Colace (over the counter) 100 mg twice a day.  Use MiraLax (over the counter) for constipation as needed.   Diet - low sodium heart healthy   Complete by: As directed    Do not put a pillow under the knee. Place it under the heel.   Complete by: As directed  Driving restrictions   Complete by: As directed    No driving for 6 weeks   Increase activity slowly as tolerated   Complete by: As directed    Lifting restrictions   Complete by: As directed    No lifting for 6 weeks   Post-operative opioid taper instructions:   Complete by: As  directed    POST-OPERATIVE OPIOID TAPER INSTRUCTIONS: It is important to wean off of your opioid medication as soon as possible. If you do not need pain medication after your surgery it is ok to stop day one. Opioids include: Codeine, Hydrocodone(Norco, Vicodin), Oxycodone(Percocet, oxycontin) and hydromorphone amongst others.  Long term and even short term use of opiods can cause: Increased pain response Dependence Constipation Depression Respiratory depression And more.  Withdrawal symptoms can include Flu like symptoms Nausea, vomiting And more Techniques to manage these symptoms Hydrate well Eat regular healthy meals Stay active Use relaxation techniques(deep breathing, meditating, yoga) Do Not substitute Alcohol to help with tapering If you have been on opioids for less than two weeks and do not have pain than it is ok to stop all together.  Plan to wean off of opioids This plan should start within one week post op of your joint replacement. Maintain the same interval or time between taking each dose and first decrease the dose.  Cut the total daily intake of opioids by one tablet each day Next start to increase the time between doses. The last dose that should be eliminated is the evening dose.      TED hose   Complete by: As directed    Use stockings (TED hose) for 2 weeks on both leg(s).  You may remove them at night for sleeping.        Follow-up Information     Swinteck, Aaron Edelman, MD. Schedule an appointment as soon as possible for a visit in 2 week(s).   Specialty: Orthopedic Surgery Why: For wound re-check, For suture removal Contact information: [address] [place] [address] [phone]                  Signed: Dorothyann Peng 11/01/2021, 8:49 AM

## 2021-11-01 NOTE — Progress Notes (Signed)
° ° °  Subjective:  Patient reports pain as mild.  Denies N/V/CP/SOB.   Objective:   VITALS:   Vitals:   10/31/21 2136 11/01/21 0148 11/01/21 0536 11/01/21 0727  BP:  92/62 (!) 90/53 94/63  Pulse: 68 62 65 60  Resp: $Remo'18 17 16 15  'zdGiy$ Temp:  98.6 F (37 C) 98.7 F (37.1 C) 98.1 F (36.7 C)  TempSrc:  Oral Oral Oral  SpO2: 96% 98% 95% 97%  Weight:      Height:        NAD ABD soft Neurovascular intact Sensation intact distally Intact pulses distally Dorsiflexion/Plantar flexion intact Incision: dressing C/D/I   Lab Results  Component Value Date   WBC 6.1 11/01/2021   HGB 9.1 (L) 11/01/2021   HCT 27.9 (L) 11/01/2021   MCV 97.9 11/01/2021   PLT 88 (L) 11/01/2021   BMET    Component Value Date/Time   NA 137 11/01/2021 0339   NA 139 04/16/2021 1122   K 4.3 11/01/2021 0339   CL 105 11/01/2021 0339   CO2 23 11/01/2021 0339   GLUCOSE 318 (H) 11/01/2021 0339   BUN 20 11/01/2021 0339   BUN 22 04/16/2021 1122   CREATININE 1.34 (H) 11/01/2021 0339   CREATININE 1.52 (H) 09/25/2021 1412   CALCIUM 8.6 (L) 11/01/2021 0339   EGFR 58 (L) 04/16/2021 1122   GFRNONAA 58 (L) 11/01/2021 0339   GFRNONAA 50 (L) 09/25/2021 1412     Assessment/Plan: 1 Day Post-Op   Principal Problem:   Osteoarthritis of right knee   WBAT with walker DVT ppx: Aspirin, SCDs, TEDS PO pain control PT/OT ABLA: Asymptomatic Dispo: D/C home once cleared by therapy     Austin Oliver 11/01/2021, 8:45 AM   [facility] is now [facility] [address], [place],  [address] Phone: [phone] [email or web address]   [employer]

## 2021-11-01 NOTE — Plan of Care (Signed)
  Problem: Activity: Goal: Ability to avoid complications of mobility impairment will improve Outcome: Progressing   Problem: Clinical Measurements: Goal: Postoperative complications will be avoided or minimized Outcome: Progressing   Problem: Pain Management: Goal: Pain level will decrease with appropriate interventions Outcome: Progressing   Problem: Skin Integrity: Goal: Will show signs of wound healing Outcome: Progressing   

## 2021-11-01 NOTE — Progress Notes (Signed)
Physical Therapy Treatment Patient Details Name: Austin Oliver MRN: [id number] DOB: 03-27-1956 Today's Date: 11/01/2021   History of Present Illness Pt s/p R TKR and iwth hx of L THR, COPD and AICD    PT Comments    Pt very cooperative and progressing well with mobility including up to ambulate increased distance in hall and negotiated stairs.  Pt eager for dc home.   Recommendations for follow up therapy are one component of a multi-disciplinary discharge planning process, led by the attending physician.  Recommendations may be updated based on patient status, additional functional criteria and insurance authorization.  Follow Up Recommendations  Follow physician's recommendations for discharge plan and follow up therapies     Assistance Recommended at Discharge Frequent or constant Supervision/Assistance  Equipment Recommendations  None recommended by PT    Recommendations for Other Services       Precautions / Restrictions Precautions Precautions: Fall;Knee Restrictions Weight Bearing Restrictions: No RLE Weight Bearing: Weight bearing as tolerated     Mobility  Bed Mobility Overal bed mobility: Needs Assistance Bed Mobility: Supine to Sit     Supine to sit: Min guard     General bed mobility comments: Pt up in chair and requests back to same    Transfers Overall transfer level: Needs assistance Equipment used: Rolling walker (2 wheels) Transfers: Sit to/from Stand Sit to Stand: Min guard;Supervision           General transfer comment: cues for LE management and use of UEs to self assist    Ambulation/Gait Ambulation/Gait assistance: Min guard;Supervision Gait Distance (Feet): 100 Feet Assistive device: Rolling walker (2 wheels) Gait Pattern/deviations: Step-to pattern;Decreased step length - right;Decreased stance time - right;Trunk flexed Gait velocity: decr     General Gait Details: cues for sequence, posture and position from  [employer] Stairs: Yes Stairs assistance: Min assist Stair Management: No rails;Step to pattern;Backwards;Forwards;With walker Number of Stairs: 4 General stair comments: 2 single steps fwd and 2 single steps bkwd; cues for sequence and RW/foot placement   Wheelchair Mobility    Modified Rankin (Stroke Patients Only)       Balance Overall balance assessment: Needs assistance Sitting-balance support: No upper extremity supported;Feet supported Sitting balance-Leahy Scale: Good     Standing balance support: No upper extremity supported Standing balance-Leahy Scale: Fair                              Cognition Arousal/Alertness: Awake/alert Behavior During Therapy: WFL for tasks assessed/performed Overall Cognitive Status: Within Functional Limits for tasks assessed                                          Exercises Total Joint Exercises Ankle Circles/Pumps: AROM;Both;15 reps;Supine Quad Sets: AROM;Both;10 reps;Supine Heel Slides: AAROM;Right;15 reps;Supine Hip ABduction/ADduction: AAROM;AROM;Right;10 reps;Supine Straight Leg Raises: AAROM;AROM;Right;15 reps;Supine Long Arc Quad: AROM;Right;10 reps;Seated    General Comments        Pertinent Vitals/Pain Pain Assessment: 0-10 Pain Score: 4  Pain Location: R knee Pain Descriptors / Indicators: Aching;Sore Pain Intervention(s): Limited activity within patient's tolerance;Monitored during session;RN gave pain meds during session;Ice applied    Home Living Family/patient expects to be discharged to:: Private residence Living Arrangements: Spouse/significant other Available Help at Discharge: Family Type of Home: House Home Access: Stairs to enter Entrance Stairs-Rails: None Entrance  Stairs-Number of Steps: 1   Home Layout: One level Home Equipment: Conservation officer, nature (2 wheels);BSC/3in1;Cane - single point      Prior Function            PT Goals (current goals can now be found  in the care plan section) Acute Rehab PT Goals Patient Stated Goal: Regain IND PT Goal Formulation: With patient Time For Goal Achievement: 11/08/21 Potential to Achieve Goals: Good Progress towards PT goals: Progressing toward goals    Frequency    7X/week      PT Plan Current plan remains appropriate    Co-evaluation              AM-PAC PT "6 Clicks" Mobility   Outcome Measure  Help needed turning from your back to your side while in a flat bed without using bedrails?: A Little Help needed moving from lying on your back to sitting on the side of a flat bed without using bedrails?: A Little Help needed moving to and from a bed to a chair (including a wheelchair)?: A Little Help needed standing up from a chair using your arms (e.g., wheelchair or bedside chair)?: A Little Help needed to walk in hospital room?: A Little Help needed climbing 3-5 steps with a railing? : A Little 6 Click Score: 18    End of Session Equipment Utilized During Treatment: Gait belt Activity Tolerance: Patient tolerated treatment well Patient left: in chair;with call bell/phone within reach;with chair alarm set;with family/visitor present Nurse Communication: Mobility status PT Visit Diagnosis: Difficulty in walking, not elsewhere classified (R26.2)     Time: 9242-6834 PT Time Calculation (min) (ACUTE ONLY): 22 min  Charges:  $Gait Training: 8-22 mins $Therapeutic Exercise: 8-22 mins                     Debe Coder PT Acute Rehabilitation Services Pager [phone] Office [phone]    Tejuan Gholson 11/01/2021, 2:06 PM

## 2021-11-04 DIAGNOSIS — M25661 Stiffness of right knee, not elsewhere classified: Secondary | ICD-10-CM | POA: Insufficient documentation

## 2021-11-19 ENCOUNTER — Encounter (HOSPITAL_COMMUNITY): Payer: Self-pay

## 2021-11-19 ENCOUNTER — Other Ambulatory Visit: Payer: Self-pay

## 2021-11-19 ENCOUNTER — Inpatient Hospital Stay (HOSPITAL_COMMUNITY)
Admission: EM | Admit: 2021-11-19 | Discharge: 2021-11-23 | DRG: 064 | Disposition: A | Discharge status: Home / Self Care | Payer: Medicare Other | Attending: Internal Medicine | Admitting: Internal Medicine

## 2021-11-19 DIAGNOSIS — R296 Repeated falls: Secondary | ICD-10-CM | POA: Diagnosis present

## 2021-11-19 DIAGNOSIS — U071 COVID-19: Secondary | ICD-10-CM | POA: Diagnosis present

## 2021-11-19 DIAGNOSIS — Z87891 Personal history of nicotine dependence: Secondary | ICD-10-CM

## 2021-11-19 DIAGNOSIS — Z8673 Personal history of transient ischemic attack (TIA), and cerebral infarction without residual deficits: Secondary | ICD-10-CM | POA: Diagnosis present

## 2021-11-19 DIAGNOSIS — Z91030 Bee allergy status: Secondary | ICD-10-CM

## 2021-11-19 DIAGNOSIS — J441 Chronic obstructive pulmonary disease with (acute) exacerbation: Secondary | ICD-10-CM | POA: Diagnosis present

## 2021-11-19 DIAGNOSIS — E119 Type 2 diabetes mellitus without complications: Secondary | ICD-10-CM

## 2021-11-19 DIAGNOSIS — Z87442 Personal history of urinary calculi: Secondary | ICD-10-CM

## 2021-11-19 DIAGNOSIS — G4733 Obstructive sleep apnea (adult) (pediatric): Secondary | ICD-10-CM

## 2021-11-19 DIAGNOSIS — I639 Cerebral infarction, unspecified: Secondary | ICD-10-CM

## 2021-11-19 DIAGNOSIS — Z888 Allergy status to other drugs, medicaments and biological substances status: Secondary | ICD-10-CM

## 2021-11-19 DIAGNOSIS — E1165 Type 2 diabetes mellitus with hyperglycemia: Secondary | ICD-10-CM | POA: Diagnosis present

## 2021-11-19 DIAGNOSIS — Z955 Presence of coronary angioplasty implant and graft: Secondary | ICD-10-CM

## 2021-11-19 DIAGNOSIS — Z881 Allergy status to other antibiotic agents status: Secondary | ICD-10-CM

## 2021-11-19 DIAGNOSIS — Z96642 Presence of left artificial hip joint: Secondary | ICD-10-CM | POA: Diagnosis present

## 2021-11-19 DIAGNOSIS — K7460 Unspecified cirrhosis of liver: Secondary | ICD-10-CM | POA: Diagnosis present

## 2021-11-19 DIAGNOSIS — Z96651 Presence of right artificial knee joint: Secondary | ICD-10-CM | POA: Diagnosis present

## 2021-11-19 DIAGNOSIS — K219 Gastro-esophageal reflux disease without esophagitis: Secondary | ICD-10-CM | POA: Diagnosis present

## 2021-11-19 DIAGNOSIS — R531 Weakness: Secondary | ICD-10-CM

## 2021-11-19 DIAGNOSIS — Z79890 Hormone replacement therapy (postmenopausal): Secondary | ICD-10-CM

## 2021-11-19 DIAGNOSIS — Z9989 Dependence on other enabling machines and devices: Secondary | ICD-10-CM

## 2021-11-19 DIAGNOSIS — Z79899 Other long term (current) drug therapy: Secondary | ICD-10-CM

## 2021-11-19 DIAGNOSIS — I6340 Cerebral infarction due to embolism of unspecified cerebral artery: Principal | ICD-10-CM | POA: Diagnosis present

## 2021-11-19 DIAGNOSIS — I69354 Hemiplegia and hemiparesis following cerebral infarction affecting left non-dominant side: Secondary | ICD-10-CM

## 2021-11-19 DIAGNOSIS — Z7982 Long term (current) use of aspirin: Secondary | ICD-10-CM

## 2021-11-19 DIAGNOSIS — E785 Hyperlipidemia, unspecified: Secondary | ICD-10-CM | POA: Diagnosis present

## 2021-11-19 DIAGNOSIS — E538 Deficiency of other specified B group vitamins: Secondary | ICD-10-CM | POA: Diagnosis present

## 2021-11-19 DIAGNOSIS — R059 Cough, unspecified: Secondary | ICD-10-CM

## 2021-11-19 DIAGNOSIS — Z95810 Presence of automatic (implantable) cardiac defibrillator: Secondary | ICD-10-CM

## 2021-11-19 DIAGNOSIS — Z825 Family history of asthma and other chronic lower respiratory diseases: Secondary | ICD-10-CM

## 2021-11-19 DIAGNOSIS — M25512 Pain in left shoulder: Secondary | ICD-10-CM

## 2021-11-19 DIAGNOSIS — R29702 NIHSS score 2: Secondary | ICD-10-CM | POA: Diagnosis present

## 2021-11-19 DIAGNOSIS — I495 Sick sinus syndrome: Secondary | ICD-10-CM | POA: Diagnosis present

## 2021-11-19 DIAGNOSIS — I10 Essential (primary) hypertension: Secondary | ICD-10-CM | POA: Diagnosis present

## 2021-11-19 DIAGNOSIS — D649 Anemia, unspecified: Secondary | ICD-10-CM | POA: Diagnosis present

## 2021-11-19 DIAGNOSIS — N400 Enlarged prostate without lower urinary tract symptoms: Secondary | ICD-10-CM | POA: Diagnosis present

## 2021-11-19 DIAGNOSIS — E039 Hypothyroidism, unspecified: Secondary | ICD-10-CM | POA: Diagnosis present

## 2021-11-19 DIAGNOSIS — I2510 Atherosclerotic heart disease of native coronary artery without angina pectoris: Secondary | ICD-10-CM | POA: Diagnosis present

## 2021-11-19 DIAGNOSIS — I7143 Infrarenal abdominal aortic aneurysm, without rupture: Secondary | ICD-10-CM | POA: Diagnosis present

## 2021-11-19 LAB — COMPREHENSIVE METABOLIC PANEL
ALT: 11 U/L (ref 0–44)
AST: 15 U/L (ref 15–41)
Albumin: 3.8 g/dL (ref 3.5–5.0)
Alkaline Phosphatase: 68 U/L (ref 38–126)
Anion gap: 6 (ref 5–15)
BUN: 17 mg/dL (ref 8–23)
CO2: 26 mmol/L (ref 22–32)
Calcium: 9 mg/dL (ref 8.9–10.3)
Chloride: 103 mmol/L (ref 98–111)
Creatinine, Ser: 1.12 mg/dL (ref 0.61–1.24)
GFR, Estimated: >60 mL/min (ref >60)
Glucose, Bld: 233 mg/dL — ABNORMAL HIGH (ref 70–99)
Potassium: 4.4 mmol/L (ref 3.5–5.1)
Sodium: 135 mmol/L (ref 135–145)
Total Bilirubin: 1.6 mg/dL — ABNORMAL HIGH (ref 0.3–1.2)
Total Protein: 7.2 g/dL (ref 6.5–8.1)

## 2021-11-19 LAB — CBC WITH DIFFERENTIAL/PLATELET
Abs Immature Granulocytes: 0.01 10*3/uL (ref 0.00–0.07)
Basophils Absolute: 0 10*3/uL (ref 0.0–0.1)
Basophils Relative: 0 %
Eosinophils Absolute: 0.1 10*3/uL (ref 0.0–0.5)
Eosinophils Relative: 3 %
HCT: 31 % — ABNORMAL LOW (ref 39.0–52.0)
Hemoglobin: 9.9 g/dL — ABNORMAL LOW (ref 13.0–17.0)
Immature Granulocytes: 0 %
Lymphocytes Relative: 29 %
Lymphs Abs: 1.2 10*3/uL (ref 0.7–4.0)
MCH: 30.8 pg (ref 26.0–34.0)
MCHC: 31.9 g/dL (ref 30.0–36.0)
MCV: 96.6 fL (ref 80.0–100.0)
Monocytes Absolute: 0.4 10*3/uL (ref 0.1–1.0)
Monocytes Relative: 9 %
Neutro Abs: 2.5 10*3/uL (ref 1.7–7.7)
Neutrophils Relative %: 59 %
Platelets: 184 10*3/uL (ref 150–400)
RBC: 3.21 MIL/uL — ABNORMAL LOW (ref 4.22–5.81)
RDW: 13.7 % (ref 11.5–15.5)
WBC: 4.2 10*3/uL (ref 4.0–10.5)
nRBC: 0 % (ref 0.0–0.2)

## 2021-11-19 NOTE — ED Triage Notes (Signed)
Pt complains of increased weakness to his left leg. Pt states that he has had multiple falls in the past 2 weeks due to his left leg giving out.

## 2021-11-20 ENCOUNTER — Emergency Department (HOSPITAL_COMMUNITY): Payer: Medicare Other

## 2021-11-20 ENCOUNTER — Encounter (HOSPITAL_COMMUNITY): Payer: Self-pay

## 2021-11-20 ENCOUNTER — Inpatient Hospital Stay (HOSPITAL_COMMUNITY): Payer: Medicare Other

## 2021-11-20 DIAGNOSIS — K219 Gastro-esophageal reflux disease without esophagitis: Secondary | ICD-10-CM | POA: Diagnosis present

## 2021-11-20 DIAGNOSIS — E785 Hyperlipidemia, unspecified: Secondary | ICD-10-CM | POA: Diagnosis present

## 2021-11-20 DIAGNOSIS — K7460 Unspecified cirrhosis of liver: Secondary | ICD-10-CM | POA: Diagnosis present

## 2021-11-20 DIAGNOSIS — J441 Chronic obstructive pulmonary disease with (acute) exacerbation: Secondary | ICD-10-CM | POA: Diagnosis present

## 2021-11-20 DIAGNOSIS — I6389 Other cerebral infarction: Secondary | ICD-10-CM

## 2021-11-20 DIAGNOSIS — Z95810 Presence of automatic (implantable) cardiac defibrillator: Secondary | ICD-10-CM | POA: Diagnosis not present

## 2021-11-20 DIAGNOSIS — I639 Cerebral infarction, unspecified: Secondary | ICD-10-CM | POA: Diagnosis not present

## 2021-11-20 DIAGNOSIS — I7143 Infrarenal abdominal aortic aneurysm, without rupture: Secondary | ICD-10-CM | POA: Diagnosis present

## 2021-11-20 DIAGNOSIS — R29702 NIHSS score 2: Secondary | ICD-10-CM | POA: Diagnosis present

## 2021-11-20 DIAGNOSIS — D649 Anemia, unspecified: Secondary | ICD-10-CM | POA: Diagnosis present

## 2021-11-20 DIAGNOSIS — U071 COVID-19: Secondary | ICD-10-CM | POA: Diagnosis present

## 2021-11-20 DIAGNOSIS — N400 Enlarged prostate without lower urinary tract symptoms: Secondary | ICD-10-CM | POA: Diagnosis present

## 2021-11-20 DIAGNOSIS — E538 Deficiency of other specified B group vitamins: Secondary | ICD-10-CM | POA: Diagnosis present

## 2021-11-20 DIAGNOSIS — I69354 Hemiplegia and hemiparesis following cerebral infarction affecting left non-dominant side: Secondary | ICD-10-CM | POA: Diagnosis not present

## 2021-11-20 DIAGNOSIS — Z96642 Presence of left artificial hip joint: Secondary | ICD-10-CM | POA: Diagnosis present

## 2021-11-20 DIAGNOSIS — I10 Essential (primary) hypertension: Secondary | ICD-10-CM | POA: Diagnosis present

## 2021-11-20 DIAGNOSIS — Z955 Presence of coronary angioplasty implant and graft: Secondary | ICD-10-CM | POA: Diagnosis not present

## 2021-11-20 DIAGNOSIS — R059 Cough, unspecified: Secondary | ICD-10-CM | POA: Diagnosis present

## 2021-11-20 DIAGNOSIS — R296 Repeated falls: Secondary | ICD-10-CM | POA: Diagnosis present

## 2021-11-20 DIAGNOSIS — E039 Hypothyroidism, unspecified: Secondary | ICD-10-CM | POA: Diagnosis present

## 2021-11-20 DIAGNOSIS — E1165 Type 2 diabetes mellitus with hyperglycemia: Secondary | ICD-10-CM | POA: Diagnosis present

## 2021-11-20 DIAGNOSIS — Z825 Family history of asthma and other chronic lower respiratory diseases: Secondary | ICD-10-CM | POA: Diagnosis not present

## 2021-11-20 DIAGNOSIS — I6340 Cerebral infarction due to embolism of unspecified cerebral artery: Secondary | ICD-10-CM | POA: Diagnosis present

## 2021-11-20 DIAGNOSIS — Z96651 Presence of right artificial knee joint: Secondary | ICD-10-CM | POA: Diagnosis present

## 2021-11-20 DIAGNOSIS — I2510 Atherosclerotic heart disease of native coronary artery without angina pectoris: Secondary | ICD-10-CM | POA: Diagnosis present

## 2021-11-20 DIAGNOSIS — Z8673 Personal history of transient ischemic attack (TIA), and cerebral infarction without residual deficits: Secondary | ICD-10-CM | POA: Diagnosis present

## 2021-11-20 DIAGNOSIS — G4733 Obstructive sleep apnea (adult) (pediatric): Secondary | ICD-10-CM | POA: Diagnosis present

## 2021-11-20 DIAGNOSIS — I495 Sick sinus syndrome: Secondary | ICD-10-CM | POA: Diagnosis present

## 2021-11-20 LAB — ECHOCARDIOGRAM COMPLETE: S' Lateral: 2.9 cm

## 2021-11-20 LAB — RESP PANEL BY RT-PCR (FLU A&B, COVID) ARPGX2
Influenza A by PCR: NEGATIVE
Influenza B by PCR: NEGATIVE
SARS Coronavirus 2 by RT PCR: POSITIVE — AB

## 2021-11-20 MED ORDER — LEVOTHYROXINE SODIUM 112 MCG PO TABS
112.0000 ug | ORAL_TABLET | Freq: Every day | ORAL | Status: DC
  Administered 2021-11-21 – 2021-11-23 (×3): 112 ug via ORAL
  Filled 2021-11-20 (×3): qty 1

## 2021-11-20 MED ORDER — GADOBUTROL 1 MMOL/ML IV SOLN
10.0000 mL | Freq: Once | INTRAVENOUS | Status: AC | PRN
  Administered 2021-11-20: 10 mL via INTRAVENOUS

## 2021-11-20 MED ORDER — PANTOPRAZOLE SODIUM 40 MG PO TBEC
40.0000 mg | DELAYED_RELEASE_TABLET | Freq: Every day | ORAL | Status: DC
  Administered 2021-11-21 – 2021-11-22 (×2): 40 mg via ORAL
  Filled 2021-11-20 (×2): qty 1

## 2021-11-20 MED ORDER — IPRATROPIUM-ALBUTEROL 0.5-2.5 (3) MG/3ML IN SOLN
3.0000 mL | Freq: Two times a day (BID) | RESPIRATORY_TRACT | Status: DC
  Administered 2021-11-20: 3 mL via RESPIRATORY_TRACT
  Filled 2021-11-20 (×2): qty 3

## 2021-11-20 MED ORDER — CLOPIDOGREL BISULFATE 75 MG PO TABS
75.0000 mg | ORAL_TABLET | Freq: Once | ORAL | Status: AC
  Administered 2021-11-20: 75 mg via ORAL
  Filled 2021-11-20: qty 1

## 2021-11-20 MED ORDER — ROSUVASTATIN CALCIUM 5 MG PO TABS
5.0000 mg | ORAL_TABLET | Freq: Every day | ORAL | Status: DC
  Administered 2021-11-21 – 2021-11-23 (×3): 5 mg via ORAL
  Filled 2021-11-20 (×3): qty 1

## 2021-11-20 MED ORDER — EZETIMIBE 10 MG PO TABS
10.0000 mg | ORAL_TABLET | Freq: Every morning | ORAL | Status: DC
  Administered 2021-11-21 – 2021-11-22 (×2): 10 mg via ORAL
  Filled 2021-11-20 (×2): qty 1

## 2021-11-20 MED ORDER — RANOLAZINE ER 500 MG PO TB12
500.0000 mg | ORAL_TABLET | Freq: Two times a day (BID) | ORAL | Status: DC
  Administered 2021-11-20 – 2021-11-23 (×5): 500 mg via ORAL
  Filled 2021-11-20 (×8): qty 1

## 2021-11-20 MED ORDER — FINASTERIDE 5 MG PO TABS
5.0000 mg | ORAL_TABLET | Freq: Every day | ORAL | Status: DC
  Administered 2021-11-21 – 2021-11-23 (×3): 5 mg via ORAL
  Filled 2021-11-20 (×3): qty 1

## 2021-11-20 MED ORDER — SODIUM CHLORIDE 0.9 % IV BOLUS
1000.0000 mL | Freq: Once | INTRAVENOUS | Status: AC
  Administered 2021-11-20: 1000 mL via INTRAVENOUS

## 2021-11-20 MED ORDER — ASPIRIN EC 81 MG PO TBEC
81.0000 mg | DELAYED_RELEASE_TABLET | Freq: Every day | ORAL | Status: DC
  Administered 2021-11-20 – 2021-11-23 (×4): 81 mg via ORAL
  Filled 2021-11-20 (×4): qty 1

## 2021-11-20 MED ORDER — IOHEXOL 350 MG/ML SOLN
100.0000 mL | Freq: Once | INTRAVENOUS | Status: AC | PRN
  Administered 2021-11-20: 100 mL via INTRAVENOUS

## 2021-11-20 MED ORDER — ENOXAPARIN SODIUM 30 MG/0.3ML IJ SOSY
30.0000 mg | PREFILLED_SYRINGE | INTRAMUSCULAR | Status: DC
  Administered 2021-11-21 – 2021-11-23 (×3): 30 mg via SUBCUTANEOUS
  Filled 2021-11-20 (×3): qty 0.3

## 2021-11-20 MED ORDER — NITROGLYCERIN 0.4 MG SL SUBL
0.4000 mg | SUBLINGUAL_TABLET | SUBLINGUAL | Status: DC | PRN
  Administered 2021-11-21: 0.4 mg via SUBLINGUAL
  Filled 2021-11-20: qty 1

## 2021-11-20 MED ORDER — TAMSULOSIN HCL 0.4 MG PO CAPS
0.8000 mg | ORAL_CAPSULE | Freq: Every day | ORAL | Status: DC
  Administered 2021-11-20 – 2021-11-22 (×3): 0.8 mg via ORAL
  Filled 2021-11-20 (×3): qty 2

## 2021-11-20 MED ORDER — SENNA 8.6 MG PO TABS
1.0000 | ORAL_TABLET | Freq: Two times a day (BID) | ORAL | Status: DC
  Administered 2021-11-20 – 2021-11-21 (×3): 8.6 mg via ORAL
  Filled 2021-11-20 (×4): qty 1

## 2021-11-20 MED ORDER — GUAIFENESIN ER 600 MG PO TB12
600.0000 mg | ORAL_TABLET | Freq: Two times a day (BID) | ORAL | Status: DC
  Administered 2021-11-20 – 2021-11-23 (×6): 600 mg via ORAL
  Filled 2021-11-20 (×6): qty 1

## 2021-11-20 NOTE — ED Notes (Addendum)
ED Provider at bedside. 

## 2021-11-20 NOTE — ED Triage Notes (Signed)
Here for MRI, transfer from [facility]. Left leg weakness/giving out. Right knee replacement 10/31/21 with spinal anesthesia, CT head negative for stroke. Dx covid +.

## 2021-11-20 NOTE — ED Provider Notes (Signed)
[facility] DEPT Provider Note   CSN: [id number] Arrival date & time: 11/19/21  1841     History  Chief Complaint  Patient presents with   Weakness    Austin Oliver is a 66 y.o. male.  CAD,       This is a 66 year old male with a history of hyperlipidemia, coronary artery disease, COPD, AICD who presents with left leg weakness.  Patient reports he had onset of worsening left leg weakness on Thursday or Friday of last week.  He recently had a right knee replacement and has been doing physical therapy and rehabilitation.  He canceled his orthopedic follow-up late in the last week as he was not feeling well.  He ultimately tested positive for COVID on Friday.  He states he has had several falls because of left leg weakness.  He is having difficulty walking with his walker because he cannot lift his leg fully.  He states that after his surgery he did have some back discomfort.  He had a "shot in my back for the surgery."  He denies any neck pain or arm weakness.  No speech difficulty or other neurologic deficits.  He does state that at times he has tingling in his toes on the left foot.  He denies any saddle anesthesia but does state he has had some difficulty initiating stream with his urine.  Patient had knee replacement on 12/15.  He did have spinal anesthesia per chart review.  Home Medications Prior to Admission medications   Medication Sig Start Date End Date Taking? Authorizing Provider  acetaminophen (TYLENOL) 325 MG tablet Take 1-2 tablets (325-650 mg total) by mouth every 6 (six) hours as needed for mild pain (pain score 1-3 or temp > 100.5). 11/01/21   Dorothyann Peng, PA  albuterol (VENTOLIN HFA) 108 (90 Base) MCG/ACT inhaler Inhale 2 puffs into the lungs every 6 (six) hours as needed for shortness of breath or wheezing. 05/22/21   [provider]  aspirin 81 MG chewable tablet Chew 1 tablet (81 mg total) by mouth 2 (two) times daily.  11/01/21 12/13/21  Cherlynn June B, PA  docusate sodium (COLACE) 100 MG capsule Take 1 capsule (100 mg total) by mouth 2 (two) times daily. 11/01/21   Dorothyann Peng, PA  ezetimibe (ZETIA) 10 MG tablet Take 10 mg by mouth in the morning.    [provider]  finasteride (PROSCAR) 5 MG tablet Take 5 mg by mouth daily. 06/13/21   [provider]  gabapentin (NEURONTIN) 100 MG capsule Take 200 mg by mouth at bedtime.    [provider]  ibuprofen (ADVIL) 200 MG tablet Take 400 mg by mouth every 8 (eight) hours as needed (inflammation/pain.).    [provider]  isosorbide mononitrate (IMDUR) 120 MG 24 hr tablet TAKE 1 TABLET BY MOUTH DAILY 10/21/21   Chandrasekhar, Mahesh A, MD  ketotifen (ZADITOR) 0.025 % ophthalmic solution Place 1 drop into both eyes 2 (two) times daily as needed (allergies).    [provider]  levothyroxine (SYNTHROID) 112 MCG tablet Take 112 mcg by mouth daily before breakfast.    [provider]  loteprednol (LOTEMAX) 0.5 % ophthalmic suspension Place 1 drop into the left eye 2 (two) times a week. Mondays & Fridays    [provider]  nitroGLYCERIN (NITROSTAT) 0.4 MG SL tablet Place 1 tablet (0.4 mg total) under the tongue every 5 (five) minutes x 3 doses as needed for chest pain.  08/27/20   Chandrasekhar, Mahesh A, MD  ondansetron (ZOFRAN) 4 MG tablet Take 1 tablet (4 mg total) by mouth every 6 (six) hours as needed for nausea. 11/01/21   Cherlynn June B, PA  oxyCODONE (OXY IR/ROXICODONE) 5 MG immediate release tablet Take 1-2 tablets (5-10 mg total) by mouth every 4 (four) hours as needed for moderate pain (pain score 4-6). 11/01/21   Cherlynn June B, PA  pantoprazole (PROTONIX) 40 MG tablet Take 40 mg by mouth daily after supper.     [provider]  ranolazine (RANEXA) 1000 MG SR tablet TAKE 1  BY MOUTH TWICE DAILY 10/09/21   Chandrasekhar, Mahesh A, MD  rosuvastatin (CRESTOR) 5 MG tablet TAKE 1  TABLET BY MOUTH DAILY 06/25/21   Chandrasekhar, Mahesh A, MD  senna (SENOKOT) 8.6 MG TABS tablet Take 1 tablet (8.6 mg total) by mouth 2 (two) times daily. 11/01/21   Dorothyann Peng, PA  shark liver oil-cocoa butter (PREPARATION H) 0.25-3-85.5 % suppository Place 1 suppository rectally daily as needed for hemorrhoids.    [provider]  tamsulosin (FLOMAX) 0.4 MG CAPS capsule Take 0.8 mg by mouth daily with supper. 10/02/20   [provider]  Tiotropium Bromide Monohydrate (SPIRIVA RESPIMAT) 2.5 MCG/ACT AERS Inhale 2 puffs into the lungs daily. 09/12/21   Laurin Coder, MD  vitamin B-12 (CYANOCOBALAMIN) 1000 MCG tablet Take 1,000 mcg by mouth in the morning.    [provider]      Allergies    Bee venom, Cleocin [clindamycin hcl], and Statins    Review of Systems   Review of Systems  Constitutional:  Negative for fever.  Respiratory:  Negative for shortness of breath.   Cardiovascular:  Negative for chest pain.  Gastrointestinal:  Negative for abdominal pain, diarrhea, nausea and vomiting.  Musculoskeletal:  Positive for back pain.  Neurological:  Positive for weakness. Negative for headaches.  All other systems reviewed and are negative.  Physical Exam Updated Vital Signs BP (!) 145/75 (BP Location: Left Arm)    Pulse 60    Temp 98 F (36.7 C) (Oral)    Resp 18    SpO2 100%  Physical Exam Vitals and nursing note reviewed.  Constitutional:      Appearance: He is well-developed. He is not ill-appearing.  HENT:     Head: Normocephalic and atraumatic.     Nose: Nose normal.     Mouth/Throat:     Mouth: Mucous membranes are moist.  Eyes:     Pupils: Pupils are equal, round, and reactive to light.  Cardiovascular:     Rate and Rhythm: Normal rate and regular rhythm.     Heart sounds: Normal heart sounds.  Pulmonary:     Effort: Pulmonary effort is normal. No respiratory distress.  Abdominal:     Palpations: Abdomen is soft.     Tenderness:  There is no abdominal tenderness. There is no rebound.  Musculoskeletal:     Cervical back: Neck supple.     Comments: Trace bilateral lower extremity edema  Lymphadenopathy:     Cervical: No cervical adenopathy.  Skin:    General: Skin is warm and dry.  Neurological:     Mental Status: He is alert and oriented to person, place, and time.     Comments: Facial symmetry noted, cranial nerves II through XII intact, fluent speech, 5 out of 5 strength bilateral upper extremities, 4 out of 5 strength with hip flexion and dorsi and plantar flexion of the  left foot when compared to the right, no clonus or significant hyperreflexia noted  Psychiatric:        Mood and Affect: Mood normal.    ED Results / Procedures / Treatments   Labs (all labs ordered are listed, but only abnormal results are displayed) Labs Reviewed  CBC WITH DIFFERENTIAL/PLATELET - Abnormal; Notable for the following components:      Result Value   RBC 3.21 (*)    Hemoglobin 9.9 (*)    HCT 31.0 (*)    All other components within normal limits  COMPREHENSIVE METABOLIC PANEL - Abnormal; Notable for the following components:   Glucose, Bld 233 (*)    Total Bilirubin 1.6 (*)    All other components within normal limits    EKG None  Radiology DG Lumbar Spine Complete  Result Date: 11/20/2021 CLINICAL DATA:  66 year old male with multiple recent falls. Left leg weakness. EXAM: LUMBAR SPINE - COMPLETE 4+ VIEW COMPARISON:  CT Abdomen and Pelvis 07/17/2021. FINDINGS: Transitional anatomy. Four lumbar type vertebrae. For the purposes of this report hypoplastic ribs are designated at T12 (full size ribs at T11) and sacralized L5 level. No pars fracture or spondylolisthesis. Straightening of lumbar lordosis appears stable. Multilevel degenerative lumbar endplate spurring but relatively preserved disc spaces. Sacrum and SI joints appear within normal limits. No acute osseous abnormality identified. Aortoiliac calcified  atherosclerosis. Left hip arthroplasty. Bulky right nephrolithiasis redemonstrated. Negative visible bowel gas. IMPRESSION: 1. Transitional anatomy. No acute osseous abnormality identified in the lumbar spine. 2. Chronic right nephrolithiasis. Electronically Signed   By: Genevie Ann M.D.   On: 11/20/2021 05:58   CT Head Wo Contrast  Result Date: 11/20/2021 CLINICAL DATA:  66 year old male with multiple recent falls. Left leg weakness. EXAM: CT HEAD WITHOUT CONTRAST TECHNIQUE: Contiguous axial images were obtained from the base of the skull through the vertex without intravenous contrast. COMPARISON:  None. FINDINGS: Brain: Cerebral volume is within normal limits for age. No midline shift, ventriculomegaly, mass effect, evidence of mass lesion, intracranial hemorrhage or evidence of cortically based acute infarction. Asymmetric nodular hypodensity in the ventral medial left thalamus. Elsewhere gray-white matter differentiation is within normal limits throughout the brain. Vascular: Mild Calcified atherosclerosis at the skull base. Faint basal ganglia vascular calcifications also. No suspicious intracranial vascular hyperdensity. Skull: Intact, negative. Sinuses/Orbits: Mild paranasal sinus mucosal thickening greater on the left. Tympanic cavities and mastoids are clear. Other: Visualized orbits and scalp soft tissues are within normal limits. IMPRESSION: 1. 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 Right side symptoms would result. 2. Otherwise negative for age noncontrast CT appearance of the brain. 3. Minor paranasal sinus mucosal thickening. Electronically Signed   By: Genevie Ann M.D.   On: 11/20/2021 05:38    Procedures Procedures    Medications Ordered in ED Medications - No data to display  ED Course/ Medical Decision Making/ A&P                           Medical Decision Making  This patient presents to the ED for concern of weakness, this involves an extensive number of  treatment options, and is a complaint that carries with it a high risk of complications and morbidity.  The differential diagnosis includes cord compression/cauda equina, stroke  Presents with left leg weakness.  Has had recent falls secondary to weakness.  He has 4-5 strength left lower extremity.  No other obvious focal deficits.  He  does report some back pain.  He also had a spinal during his surgery.  This would put him at risk for bleeding and infection.  Stroke is also consideration.  Given his AICD, he will need to have his MRI at [facility].  Have requested for post void residual.  Co morbidities that complicate the patient evaluation CAD, HTN   Additional history obtained from patient, chart reviewed External records from outside source obtained and reviewed including Operative notes   Labs: I Ordered, and personally interpreted labs.  The pertinent results include:  CBC/BMP wnl   Imaging Studies ordered: I ordered imaging studies including CT, xray, MRI I independently visualized and interpreted imaging which showed CT with some left sided age-indeterminant changes, xray reassuring, MRI pending I agree with the radiologist interpretation   Cardiac Monitoring: The patient was maintained on a cardiac monitor.  I personally viewed and interpreted the cardiac monitored which showed an underlying rhythm of: n/a   Medicines  I ordered medication including na/  for n/a I have reviewed the patients home medicines and have made adjustments as needed   Critical Interventions: n/a   Consultations Obtained: I requested consultation with the n/a,  and discussed lab and imaging findings as well as pertinent plan - they recommend: n/a   Problem List / ED Course:  Problem List Items Addressed This Visit   None Visit Diagnoses     Weakness    -  Primary         Reevaluation: After the interventions noted above, I reevaluated the patient and found that they have :stayed  the same   Social Determinants of Health: n/a   Dispostion: After consideration of the diagnostic results and the patients response to treatment, I feel that the patent would benefit from transfer for MRI to r/o cauda equina and stroke.         Final Clinical Impression(s) / ED Diagnoses Final diagnoses:  Weakness    Rx / DC Orders ED Discharge Orders     None         Marikay Roads, Barbette Hair, MD 11/20/21 [phone]

## 2021-11-20 NOTE — ED Notes (Signed)
[facility] called for transportation to [facility] ED for pt to wait on an MRI

## 2021-11-20 NOTE — H&P (Addendum)
History and Physical    Austin Oliver HFS:[id number] DOB: 04-29-1956 DOA: 11/19/2021  PCP: [name], MD Patient coming from: home  I have personally briefly reviewed patient's old medical records in [place]  Chief Complaint: left leg weakness and falls  HPI: Austin Oliver is a 66 y.o. male with medical history significant of h/o hypothyroidism, HLD, HTN, CAD s/p DES to LAD in 08/2020, SSS s/p pacemaker in 11/2020, COPD, OSA on cpap, recent h/o right knee placement on 12/15 presents to [facility] due to left leg weakness, multiple falls in the past two weeks, he also reports tested positive for covid 19 on friday  ED Course: Blood pressure (!) 110/55, pulse 64, temperature 98 F (36.7 C), temperature source Oral, resp. rate 18, SpO2 99 %. He is sent from [facility] ED to [facility] ED for MRI, found to have acute CVA, neurology consulted, recommend hospitalist to admit, neurology will consult   Covid screening test pending collection   Review of Systems: As per HPI otherwise all other systems reviewed and are negative.   Past Medical History:  Diagnosis Date   AICD (automatic cardioverter/defibrillator) present    for sick sinus syndrome   Cirrhosis of liver (HCC)    COPD (chronic obstructive pulmonary disease) ([place])    Coronary artery calcification seen on CAT scan 01/14/2017   Essential hypertension 09/11/2020   GERD (gastroesophageal reflux disease)    History of kidney stones    Hyperlipidemia    Hypothyroidism    OSA on CPAP    Mild with AHI 7.8/hr >>on CPAP   Osteoarthritis    Shingles 2012    Past Surgical History:  Procedure Laterality Date   CORONARY STENT INTERVENTION N/A 08/24/2020   Procedure: CORONARY STENT INTERVENTION;  Surgeon: [name], MD;  Location: [place] [facility];  Service: Cardiovascular;  Laterality: N/A;   HEEL SPUR SURGERY Left 80's   INTRAVASCULAR PRESSURE WIRE/FFR STUDY N/A 04/25/2021   Procedure: INTRAVASCULAR PRESSURE WIRE/FFR STUDY;   Surgeon: [name], MD;  Location: [place] [facility];  Service: Cardiovascular;  Laterality: N/A;   INTRAVASCULAR ULTRASOUND/IVUS N/A 08/24/2020   Procedure: Intravascular Ultrasound/IVUS;  Surgeon: [name], MD;  Location: [place] [facility];  Service: Cardiovascular;  Laterality: N/A;   KNEE ARTHROPLASTY Right 10/31/2021   Procedure: COMPUTER ASSISTED TOTAL KNEE ARTHROPLASTY;  Surgeon: Rod Can, MD;  Location: [facility];  Service: Orthopedics;  Laterality: Right;   KNEE SURGERY Bilateral    numerous times   LEFT HEART CATH AND CORONARY ANGIOGRAPHY N/A 08/24/2020   Procedure: LEFT HEART CATH AND CORONARY ANGIOGRAPHY;  Surgeon: [name], MD;  Location: [place] [facility];  Service: Cardiovascular;  Laterality: N/A;   LEFT HEART CATH AND CORONARY ANGIOGRAPHY N/A 04/25/2021   Procedure: LEFT HEART CATH AND CORONARY ANGIOGRAPHY;  Surgeon: [name], MD;  Location: [place] [facility];  Service: Cardiovascular;  Laterality: N/A;   NECK SURGERY  70's   PACEMAKER IMPLANT N/A 12/04/2020   Procedure: PACEMAKER IMPLANT;  Surgeon: [name], MD;  Location: [place] [facility];  Service: Cardiovascular;  Laterality: N/A;   TOTAL HIP ARTHROPLASTY Left 03/09/2017   Procedure: LEFT TOTAL HIP ARTHROPLASTY ANTERIOR APPROACH;  Surgeon: Rod Can, MD;  Location: [place];  Service: Orthopedics;  Laterality: Left;  Dr. requesting RNFA    Social History  reports that he quit smoking about 4 years ago. His smoking use included cigarettes. He has a 90.00 pack-year smoking history. He has never used  smokeless tobacco. He reports that he does not drink alcohol and does not use drugs.  Allergies  Allergen Reactions   Bee Venom Swelling    Extreme Swelling of site     Cleocin [Clindamycin Hcl] Rash    RASH IN BETWEEN FINGERS   Statins Other (See Comments)    Muscles aches with several statins    Family History  Problem Relation Age of Onset   Dementia Mother    Heart  Problems Father    COPD Father    Lung cancer Father     Prior to Admission medications   Medication Sig Start Date End Date Taking? Authorizing Provider  acetaminophen (TYLENOL) 325 MG tablet Take 1-2 tablets (325-650 mg total) by mouth every 6 (six) hours as needed for mild pain (pain score 1-3 or temp > 100.5). 11/01/21   Dorothyann Peng, PA  albuterol (VENTOLIN HFA) 108 (90 Base) MCG/ACT inhaler Inhale 2 puffs into the lungs every 6 (six) hours as needed for shortness of breath or wheezing. 05/22/21   [provider]  aspirin 81 MG chewable tablet Chew 1 tablet (81 mg total) by mouth 2 (two) times daily. 11/01/21 12/13/21  Cherlynn June B, PA  docusate sodium (COLACE) 100 MG capsule Take 1 capsule (100 mg total) by mouth 2 (two) times daily. 11/01/21   Dorothyann Peng, PA  ezetimibe (ZETIA) 10 MG tablet Take 10 mg by mouth in the morning.    [provider]  finasteride (PROSCAR) 5 MG tablet Take 5 mg by mouth daily. 06/13/21   [provider]  gabapentin (NEURONTIN) 100 MG capsule Take 200 mg by mouth at bedtime.    [provider]  ibuprofen (ADVIL) 200 MG tablet Take 400 mg by mouth every 8 (eight) hours as needed (inflammation/pain.).    [provider]  isosorbide mononitrate (IMDUR) 120 MG 24 hr tablet TAKE 1 TABLET BY MOUTH DAILY 10/21/21   Chandrasekhar, Mahesh A, MD  ketotifen (ZADITOR) 0.025 % ophthalmic solution Place 1 drop into both eyes 2 (two) times daily as needed (allergies).    [provider]  levothyroxine (SYNTHROID) 112 MCG tablet Take 112 mcg by mouth daily before breakfast.    [provider]  loteprednol (LOTEMAX) 0.5 % ophthalmic suspension Place 1 drop into the left eye 2 (two) times a week. Mondays & Fridays    [provider]  nitroGLYCERIN (NITROSTAT) 0.4 MG SL tablet Place 1 tablet (0.4 mg total) under the tongue every 5 (five) minutes x 3 doses as needed for chest pain. 08/27/20    Chandrasekhar, Mahesh A, MD  ondansetron (ZOFRAN) 4 MG tablet Take 1 tablet (4 mg total) by mouth every 6 (six) hours as needed for nausea. 11/01/21   Cherlynn June B, PA  oxyCODONE (OXY IR/ROXICODONE) 5 MG immediate release tablet Take 1-2 tablets (5-10 mg total) by mouth every 4 (four) hours as needed for moderate pain (pain score 4-6). 11/01/21   Cherlynn June B, PA  pantoprazole (PROTONIX) 40 MG tablet Take 40 mg by mouth daily after supper.     [provider]  ranolazine (RANEXA) 1000 MG SR tablet TAKE 1  BY MOUTH TWICE DAILY 10/09/21   Chandrasekhar, Mahesh A, MD  rosuvastatin (CRESTOR) 5 MG tablet TAKE 1 TABLET BY MOUTH DAILY 06/25/21   Chandrasekhar, Mahesh A, MD  senna (SENOKOT) 8.6 MG TABS tablet Take 1 tablet (8.6 mg total) by mouth 2 (two) times daily. 11/01/21   Dorothyann Peng, PA  shark liver oil-cocoa butter (PREPARATION H) 0.25-3-85.5 % suppository Place 1 suppository rectally daily as needed for hemorrhoids.    [provider]  tamsulosin (FLOMAX) 0.4 MG CAPS capsule Take 0.8 mg by mouth daily with supper. 10/02/20   [provider]  Tiotropium Bromide Monohydrate (SPIRIVA RESPIMAT) 2.5 MCG/ACT AERS Inhale 2 puffs into the lungs daily. 09/12/21   Laurin Coder, MD  vitamin B-12 (CYANOCOBALAMIN) 1000 MCG tablet Take 1,000 mcg by mouth in the morning.    [provider]    Physical Exam: Vitals:   11/20/21 1000 11/20/21 1100 11/20/21 1300 11/20/21 1535  BP: 112/66 107/69 124/71 (!) 110/55  Pulse: 66 78 62 64  Resp: 17 18 17 18   Temp:  97.9 F (36.6 C) 97.9 F (36.6 C) 98 F (36.7 C)  TempSrc:  Oral Oral Oral  SpO2: 99% 96% 94% 99%    Constitutional: NAD, calm, comfortable Eyes: PERRL, lids and conjunctivae normal ENMT: Mucous membranes are moist.  Respiratory: mild intermittent wheezing right lung. Normal respiratory effort. No accessory muscle use.  Cardiovascular: Regular rate and rhythm,  No extremity edema. 2+ pedal  pulses. No carotid bruits.  Abdomen: no tenderness, not distended, Bowel sounds positive.  Musculoskeletal: right knee post op changes, right lower extremity edema, left leg weakness, not able to lift against resistance  Skin: no rashes, lesions, ulcers. No induration Neurologic: left lower extremity weakness Psychiatric: Normal judgment and insight. Alert and oriented x 3. Normal mood.    Labs on Admission: I have personally reviewed following labs and imaging studies  CBC: Recent Labs  Lab 11/19/21 2038  WBC 4.2  NEUTROABS 2.5  HGB 9.9*  HCT 31.0*  MCV 96.6  PLT 024    Basic Metabolic Panel: Recent Labs  Lab 11/19/21 2038  NA 135  K 4.4  CL 103  CO2 26  GLUCOSE 233*  BUN 17  CREATININE 1.12  CALCIUM 9.0    GFR: CrCl cannot be calculated (Unknown ideal weight.).  Liver Function Tests: Recent Labs  Lab 11/19/21 2038  AST 15  ALT 11  ALKPHOS 68  BILITOT 1.6*  PROT 7.2  ALBUMIN 3.8    Urine analysis:    Component Value Date/Time   COLORURINE YELLOW 04/25/2008 2248   APPEARANCEUR HAZY (A) 04/25/2008 2248   LABSPEC 1.022 04/25/2008 2248   PHURINE 5.5 04/25/2008 2248   GLUCOSEU NEGATIVE 04/25/2008 2248   HGBUR LARGE (A) 04/25/2008 2248   BILIRUBINUR NEGATIVE 04/25/2008 2248   KETONESUR NEGATIVE 04/25/2008 2248   PROTEINUR NEGATIVE 04/25/2008 2248   UROBILINOGEN 1.0 04/25/2008 2248   NITRITE NEGATIVE 04/25/2008 2248   LEUKOCYTESUR NEGATIVE 04/25/2008 2248    Radiological Exams on Admission: DG Lumbar Spine Complete  Result Date: 11/20/2021 CLINICAL DATA:  66 year old male with multiple recent falls. Left leg weakness. EXAM: LUMBAR SPINE - COMPLETE 4+ VIEW COMPARISON:  CT Abdomen and Pelvis 07/17/2021. FINDINGS: Transitional anatomy. Four lumbar type vertebrae. For the purposes of this report hypoplastic ribs are designated at T12 (full size ribs at T11) and sacralized L5 level. No pars fracture or spondylolisthesis. Straightening of lumbar lordosis  appears stable. Multilevel degenerative lumbar endplate spurring but relatively preserved disc spaces. Sacrum and SI joints appear within normal limits. No acute osseous abnormality identified. Aortoiliac calcified atherosclerosis. Left hip arthroplasty. Bulky right nephrolithiasis redemonstrated. Negative visible bowel gas. IMPRESSION: 1. Transitional anatomy. No acute osseous abnormality identified in the lumbar spine. 2. Chronic right nephrolithiasis. Electronically Signed   By: Herminio Heads.D.  On: 11/20/2021 05:58   CT Head Wo Contrast  Result Date: 11/20/2021 CLINICAL DATA:  66 year old male with multiple recent falls. Left leg weakness. EXAM: CT HEAD WITHOUT CONTRAST TECHNIQUE: Contiguous axial images were obtained from the base of the skull through the vertex without intravenous contrast. COMPARISON:  None. FINDINGS: Brain: Cerebral volume is within normal limits for age. No midline shift, ventriculomegaly, mass effect, evidence of mass lesion, intracranial hemorrhage or evidence of cortically based acute infarction. Asymmetric nodular hypodensity in the ventral medial left thalamus. Elsewhere gray-white matter differentiation is within normal limits throughout the brain. Vascular: Mild Calcified atherosclerosis at the skull base. Faint basal ganglia vascular calcifications also. No suspicious intracranial vascular hyperdensity. Skull: Intact, negative. Sinuses/Orbits: Mild paranasal sinus mucosal thickening greater on the left. Tympanic cavities and mastoids are clear. Other: Visualized orbits and scalp soft tissues are within normal limits. IMPRESSION: 1. 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 Right side symptoms would result. 2. Otherwise negative for age noncontrast CT appearance of the brain. 3. Minor paranasal sinus mucosal thickening. Electronically Signed   By: Genevie Ann M.D.   On: 11/20/2021 05:38   MR BRAIN WO CONTRAST  Result Date: 11/20/2021 CLINICAL DATA:  Neuro  deficit, acute, stroke suspected EXAM: MRI HEAD WITHOUT CONTRAST TECHNIQUE: Multiplanar, multiecho pulse sequences of the brain and surrounding structures were obtained without intravenous contrast. COMPARISON:  CT head from the same day. FINDINGS: Brain: Many scattered acute or early subacute infarcts in bilateral frontal and parietal lobes, left temporal and occipital lobes, left aspect of the splenium of the corpus callosum, and left cerebellum. The majority of these infarcts are small, although there are some areas of somewhat confluent infarct in the left parietal and occipital lobes and left aspect of the splenium corpus callosum. Associated edema without significant mass effect. No evidence of acute hemorrhage, hydrocephalus, midline shift, extra-axial fluid collection, or mass lesion. Vascular: Major arterial flow voids are maintained skull base. Skull and upper cervical spine: Normal marrow signal. Sinuses/Orbits: Mild paranasal sinus mucosal thickening. Unremarkable orbits. Other: Trace bilateral mastoid effusions.  The IMPRESSION: Many scattered acute or early subacute infarcts in bilateral frontal and parietal lobes, left temporal and occipital lobes, left aspect of the splenium of the corpus callosum, and left cerebellum. Given involvement of multiple vascular territories, consider a central embolic etiology. Associated edema without significant mass effect. Electronically Signed   By: Margaretha Sheffield M.D.   On: 11/20/2021 14:40   MR Lumbar Spine W Wo Contrast  Result Date: 11/20/2021 CLINICAL DATA:  Weakness in left leg, multiple falls in the past 2 weeks EXAM: MRI LUMBAR SPINE WITHOUT AND WITH CONTRAST TECHNIQUE: Multiplanar and multiecho pulse sequences of the lumbar spine were obtained without and with intravenous contrast. CONTRAST:  52mL GADAVIST GADOBUTROL 1 MMOL/ML IV SOLN COMPARISON:  Lumbar spine radiographs 11/20/2021 FINDINGS: Segmentation: Transitional anatomy is noted there is  sacralization of the L5 vertebral body. For the purposes of this report, the lowest rudimentary disc space is designated L5-S1 which is in keeping with a lumbar spine radiographs from earlier the same day. Alignment:  Normal. Vertebrae: Vertebral body heights are preserved. There is mild degenerative marrow signal abnormality at L2-L3. Small Schmorl's nodes are noted along the superior L1 and L2 endplates. There is no suspicious marrow signal abnormality. There is no abnormal marrow enhancement. Conus medullaris and cauda equina: Conus extends to the mid L1 level. Conus and cauda equina appear normal. There is no abnormal enhancement of the conus  or cauda equina nerve roots. Paraspinal and other soft tissues: There is mild aneurysmal dilation of the infrarenal abdominal aorta measuring up to 3.2 cm, unchanged compared to the prior abdominal CT of 07/17/2021. Signal abnormality in the right kidney likely reflects the prominent renal stone as seen on prior CT. Disc levels: There is mild disc desiccation and narrowing at L4-L5. The other disc spaces are overall preserved. There is mild multilevel facet arthropathy, most advanced at L3-L4. Small facet joint effusions are noted bilaterally at L2-L3 and on the left at L3-L4 and L4-L5. T12-L1: No significant spinal canal or neural foraminal stenosis L1-L2: There is a mild disc bulge and mild bilateral facet arthropathy resulting in mild left and no significant right neural foraminal stenosis. L2-L3: There is a mild disc bulge and mild bilateral facet arthropathy resulting in mild bilateral neural foraminal stenosis and mild crowding of the left subarticular zone without evidence of frank nerve root impingement. L3-L4: There is a diffuse disc bulge with bilateral foraminal components, ligamentum flavum thickening, and bilateral facet arthropathy resulting in mild spinal canal stenosis with crowding of the bilateral subarticular zones and possible mass effect on the  traversing left L4 nerve root, and mild bilateral neural foraminal stenosis. L4-L5: There is a diffuse disc bulge with a superimposed central protrusion and mild bilateral facet arthropathy resulting in crowding of the bilateral subarticular zones with possible mass effect on the traversing left L5 nerve root and mild right and no significant left neural foraminal stenosis. L5-S1: No significant spinal canal or neural foraminal stenosis. IMPRESSION: 1. Diffuse disc bulge with superimposed central protrusion at L4-L5 resulting in crowding of the subarticular zones with possible mass effect on the traversing left L5 nerve root. 2. Mild disc bulge at L3-L4 resulting in crowding of the subarticular zones with possible mass effect on the traversing left L4 nerve root. 3. Otherwise, no high-grade spinal canal or neural foraminal stenosis, and no other evidence of nerve root impingement. 4. Multilevel facet arthropathy, most advanced at L3-L4, with small facet joint effusions bilaterally at L2-L3 and on the left at L3-L4 and L4-L5. 5. Unchanged 3.2 cm infrarenal abdominal aortic aneurysm. Follow-up imaging is recommended every 3 years. Electronically Signed   By: Valetta Mole M.D.   On: 11/20/2021 15:07    EKG: Independently reviewed.   Assessment/Plan Active Problems:   * No active hospital problems. *    Acute CVA Suspect embolic On asa, zetia ,crestor at home, continue Hold bp meds to allow permission hypertension  Neurology consulted ,will follow recommendation  Elevated blood glucose Does not appear to have prior diagnosis of diabetes Will check A1c Carb modified diet for now Sliding scale insulin  Low back pain MRI spine showed diffuse disc bulge, no high grade spinal canal or neural foraminal stenosis , no evidence of nerve root impingement, detail please refer to original MRI spine Analgesic and physical therapy  CAD s/p DES to LAD in 08/2020 Denies chest pain Continue home  medication  SSS s/p PPM  HTN Allow permissive hypertension  COPD, no hypoxia, no signficant cough, denies sob Mild wheezing on right side Will start nebs, mucinex, get cxr  Covid 19 Finished oral treatment Some productive cough Some wheezing, no hypoxia, denies sob, will get cxr, start mucinex, nebs Quit smoking in 2018  Normocytic anemia Suspect had blood loss anemia from recent surgery, as hemoglobin.  On 12/16 was 9.1, hemoglobin today is 9.9 History of B12 deficiency No overt sign of external bleed Continue B12  iron level in 09/2021 was unremarkable Check FOBT  Hypothyroidism, continue Synthroid  BPH  Continue on proscar and flomax Reports having more trouble initating and straining Will get bladder scan F/u with Dr Gloriann Loan  OSA, continue CPAP Reports has not use cpap since after knee surgery  S/p right knee replacement on 12/15 Right lower leg edematous, venous doppler ordered  to rule out DVT  Falls, fall prevention, PT eval   DVT prophylaxis: lovenox   Code Status:   full Family Communication:  Son at bedside   Patient is from: home   Anticipated DC to: TBD   Anticipated DC date:  TBD   Consults called:  neurology Admission status:  Inpatient  Severity of Illness: The appropriate patient status for this patient is INPATIENT due to history and comorbidities, severity of illness, required intensity of service to ensure the patient's safety and to avoid risk of adverse events/further clinical deterioration.  Severity of illness/comorbidities: Intensity of service: tests, high frequency of surveillance, interventions It is not anticipated that the patient will be medically stable for discharge from the hospital within 2 midnights of admission.    Voice Recognition Viviann Spare dictation system was used to create this note, attempts have been made to correct errors. Please contact the author with questions and/or clarifications.  Florencia Reasons MD PhD FACP [facility]  How to contact the [facility] Attending or Consulting provider [place] or covering provider during after hours [place], for this patient?   Check the care team in [facility] and look for a) attending/consulting [facility] provider listed and b) the [facility] team listed Log into [email or web address] and use [facility]'s universal password to access. If you do not have the password, please contact the hospital operator. Locate the [facility] provider you are looking for under [facility] and page to a number that you can be directly reached. If you still have difficulty reaching the provider, please page the [facility] (Director on Call) for the Hospitalists listed on amion for assistance.  11/20/2021, 4:40 PM

## 2021-11-20 NOTE — ED Notes (Signed)
ED Provider at bedside. 

## 2021-11-20 NOTE — ED Notes (Signed)
Provider at bedside

## 2021-11-20 NOTE — ED Notes (Signed)
Stroke MD at bedside

## 2021-11-20 NOTE — ED Provider Notes (Addendum)
°  Physical Exam  BP (!) 116/93 (BP Location: Right Arm)    Pulse 64    Temp 97.9 F (36.6 C) (Oral)    Resp 16    SpO2 98%   Physical Exam Vitals and nursing note reviewed.  Constitutional:      General: He is not in acute distress.    Appearance: He is well-developed.  HENT:     Head: Normocephalic and atraumatic.  Eyes:     Conjunctiva/sclera: Conjunctivae normal.  Cardiovascular:     Rate and Rhythm: Normal rate and regular rhythm.     Pulses: Normal pulses.  Pulmonary:     Effort: Pulmonary effort is normal. No respiratory distress.  Abdominal:     Palpations: Abdomen is soft.     Tenderness: There is no abdominal tenderness.  Musculoskeletal:        General: No swelling.     Cervical back: Neck supple.  Skin:    General: Skin is warm and dry.     Capillary Refill: Capillary refill takes less than 2 seconds.  Neurological:     Mental Status: He is alert.     Comments: Alert, oriented x3, cranial nerves II through XII intact, there is 3-4 out of 5 weakness in left lower extremity, 5 out of 5 strength in right lower extremity, right upper extremity and left upper extremity  Psychiatric:        Mood and Affect: Mood normal.    ED Course/Procedures     .Critical Care Performed by: Lucrezia Starch, MD Authorized by: Lucrezia Starch, MD   Critical care provider statement:    Critical care time (minutes):  34   Critical care was necessary to treat or prevent imminent or life-threatening deterioration of the following conditions:  CNS failure or compromise   Critical care was time spent personally by me on the following activities:  Development of treatment plan with patient or surrogate, discussions with consultants, evaluation of patient's response to treatment, examination of patient, ordering and review of laboratory studies, ordering and review of radiographic studies, ordering and performing treatments and interventions, pulse oximetry, re-evaluation of patient's  condition and review of old charts   I assumed direction of critical care for this patient from another provider in my specialty: yes     Care discussed with: admitting provider    MDM   66 year old gentleman presenting to ER with concern for left leg weakness.  Sent from [place] for MRI brain and L-spine at [facility], patient has pacemaker that is MRI compatible but needs to be completed at [facility].  On exam patient does have focal weakness in his left leg.  Otherwise well-appearing in no distress with stable vital signs.  MRI brain concerning for many scattered acute or early subacute infarcts, concern for central embolic etiology.  Will request neurology consult and admit to medicine for further management.  Discussed with Dr. Quinn Axe, she recommends starting aspirin and Plavix daily, does not need Plavix load at this point.  Recommends medicine admission, she will see in consultation.    Lucrezia Starch, MD 11/20/21 1503    Lucrezia Starch, MD 11/20/21 [id number]

## 2021-11-20 NOTE — ED Notes (Signed)
Patient transported to MRI 

## 2021-11-20 NOTE — Progress Notes (Signed)
°  Echocardiogram 2D Echocardiogram has been performed.  Austin Oliver 11/20/2021, 5:43 PM

## 2021-11-20 NOTE — Consult Note (Signed)
Neurology Consultation  Reason for Consult: Strokes on MRI Referring Physician: Dr. Roslynn Amble  CC: Left lower extremity weakness  History is obtained from: Patient, Patient's son at bedside, Chart review  HPI: Austin Oliver is a 66 y.o. male with a medical history significant for sick sinus syndrome s/p AICD, COPD, cirrhosis of the liver, coronary artery calcification, essential hypertension, hyperlipidemia, obstructive sleep apnea on CPAP, and osteoarthritis who initially presented to [facility] on 1/4 for evaluation of left lower extremity weakness since Thursday, December 29. Patient recently had a right knee replacement on 12/15 with spinal anesthesia and has been in physical therapy following his procedure. He has been walking with a walker at home since his knee replacement. He noted that at his last PT appointment, he felt weak on the left leg. Over the past 2 days patient states that he has been dragging his left leg and has had two falls due to his weakness prompting him to be evaluated. Patient was also found recently to be COVID positive and states that since he started taking his medication, for the first 1-2 days, when he woke up he felt disoriented and confused and that this took some time to clear. He denies current confusion or disorientation. Patient's only other complaint is issues with initiating his urine flow since his surgery. Patient was subsequently transferred to [facility] ED to undergo MRI imaging with AICD revealing multiple acute/subacute infarcts in bilateral hemispheres concerning for embolic etiology and neurology was consulted for further evaluation and management.   LKW: 12/29  TNK given?: no, patient presented well after the thrombolytic therapy window IR Thrombectomy? No, presentation is not consistent with an LVO Modified Rankin Scale: 0-Completely asymptomatic and back to baseline post- stroke  ROS: A complete ROS was performed and is negative except as noted in the HPI.    Past Medical History:  Diagnosis Date   AICD (automatic cardioverter/defibrillator) present    for sick sinus syndrome   Cirrhosis of liver (HCC)    COPD (chronic obstructive pulmonary disease) ([place])    Coronary artery calcification seen on CAT scan 01/14/2017   Essential hypertension 09/11/2020   GERD (gastroesophageal reflux disease)    History of kidney stones    Hyperlipidemia    Hypothyroidism    OSA on CPAP    Mild with AHI 7.8/hr >>on CPAP   Osteoarthritis    Shingles 2012   Past Surgical History:  Procedure Laterality Date   CORONARY STENT INTERVENTION N/A 08/24/2020   Procedure: CORONARY STENT INTERVENTION;  Surgeon: [name], MD;  Location: [place] [facility];  Service: Cardiovascular;  Laterality: N/A;   HEEL SPUR SURGERY Left 80's   INTRAVASCULAR PRESSURE WIRE/FFR STUDY N/A 04/25/2021   Procedure: INTRAVASCULAR PRESSURE WIRE/FFR STUDY;  Surgeon: [name], MD;  Location: [place] [facility];  Service: Cardiovascular;  Laterality: N/A;   INTRAVASCULAR ULTRASOUND/IVUS N/A 08/24/2020   Procedure: Intravascular Ultrasound/IVUS;  Surgeon: [name], MD;  Location: [place] [facility];  Service: Cardiovascular;  Laterality: N/A;   KNEE ARTHROPLASTY Right 10/31/2021   Procedure: COMPUTER ASSISTED TOTAL KNEE ARTHROPLASTY;  Surgeon: Rod Can, MD;  Location: [facility];  Service: Orthopedics;  Laterality: Right;   KNEE SURGERY Bilateral    numerous times   LEFT HEART CATH AND CORONARY ANGIOGRAPHY N/A 08/24/2020   Procedure: LEFT HEART CATH AND CORONARY ANGIOGRAPHY;  Surgeon: [name], MD;  Location: [place] [facility];  Service: Cardiovascular;  Laterality: N/A;   LEFT HEART CATH AND CORONARY ANGIOGRAPHY N/A 04/25/2021  Procedure: LEFT HEART CATH AND CORONARY ANGIOGRAPHY;  Surgeon: [name], MD;  Location: [place] [facility];  Service: Cardiovascular;  Laterality: N/A;   NECK SURGERY  70's   PACEMAKER IMPLANT N/A 12/04/2020   Procedure:  PACEMAKER IMPLANT;  Surgeon: [name], MD;  Location: [place] [facility];  Service: Cardiovascular;  Laterality: N/A;   TOTAL HIP ARTHROPLASTY Left 03/09/2017   Procedure: LEFT TOTAL HIP ARTHROPLASTY ANTERIOR APPROACH;  Surgeon: Rod Can, MD;  Location: [place];  Service: Orthopedics;  Laterality: Left;  Dr. requesting RNFA   Family History  Problem Relation Age of Onset   Dementia Mother    Heart Problems Father    COPD Father    Lung cancer Father    Social History:   reports that he quit smoking about 4 years ago. His smoking use included cigarettes. He has a 90.00 pack-year smoking history. He has never used smokeless tobacco. He reports that he does not drink alcohol and does not use drugs.  Medications  Current Facility-Administered Medications:    aspirin EC tablet 81 mg, 81 mg, Oral, Daily, Lucrezia Starch, MD, 81 mg at 11/20/21 1527  Current Outpatient Medications:    acetaminophen (TYLENOL) 325 MG tablet, Take 1-2 tablets (325-650 mg total) by mouth every 6 (six) hours as needed for mild pain (pain score 1-3 or temp > 100.5)., Disp: , Rfl:    albuterol (VENTOLIN HFA) 108 (90 Base) MCG/ACT inhaler, Inhale 2 puffs into the lungs every 6 (six) hours as needed for shortness of breath or wheezing., Disp: , Rfl:    aspirin 81 MG chewable tablet, Chew 1 tablet (81 mg total) by mouth 2 (two) times daily., Disp: 60 tablet, Rfl: 1   docusate sodium (COLACE) 100 MG capsule, Take 1 capsule (100 mg total) by mouth 2 (two) times daily., Disp: 10 capsule, Rfl: 0   ezetimibe (ZETIA) 10 MG tablet, Take 10 mg by mouth in the morning., Disp: , Rfl:    finasteride (PROSCAR) 5 MG tablet, Take 5 mg by mouth daily., Disp: , Rfl:    gabapentin (NEURONTIN) 100 MG capsule, Take 200 mg by mouth at bedtime., Disp: , Rfl:    ibuprofen (ADVIL) 200 MG tablet, Take 400 mg by mouth every 8 (eight) hours as needed (inflammation/pain.)., Disp: , Rfl:    isosorbide mononitrate (IMDUR) 120 MG 24  hr tablet, TAKE 1 TABLET BY MOUTH DAILY, Disp: 90 tablet, Rfl: 2   ketotifen (ZADITOR) 0.025 % ophthalmic solution, Place 1 drop into both eyes 2 (two) times daily as needed (allergies)., Disp: , Rfl:    levothyroxine (SYNTHROID) 112 MCG tablet, Take 112 mcg by mouth daily before breakfast., Disp: , Rfl:    loteprednol (LOTEMAX) 0.5 % ophthalmic suspension, Place 1 drop into the left eye 2 (two) times a week. Mondays & Fridays, Disp: , Rfl:    nitroGLYCERIN (NITROSTAT) 0.4 MG SL tablet, Place 1 tablet (0.4 mg total) under the tongue every 5 (five) minutes x 3 doses as needed for chest pain., Disp: 25 tablet, Rfl: 4   ondansetron (ZOFRAN) 4 MG tablet, Take 1 tablet (4 mg total) by mouth every 6 (six) hours as needed for nausea., Disp: 20 tablet, Rfl: 0   oxyCODONE (OXY IR/ROXICODONE) 5 MG immediate release tablet, Take 1-2 tablets (5-10 mg total) by mouth every 4 (four) hours as needed for moderate pain (pain score 4-6)., Disp: 30 tablet, Rfl: 0   pantoprazole (PROTONIX) 40 MG tablet, Take 40 mg by mouth  daily after supper. , Disp: , Rfl:    ranolazine (RANEXA) 1000 MG SR tablet, TAKE 1  BY MOUTH TWICE DAILY, Disp: 180 tablet, Rfl: 2   rosuvastatin (CRESTOR) 5 MG tablet, TAKE 1 TABLET BY MOUTH DAILY, Disp: 90 tablet, Rfl: 3   senna (SENOKOT) 8.6 MG TABS tablet, Take 1 tablet (8.6 mg total) by mouth 2 (two) times daily., Disp: 120 tablet, Rfl: 0   shark liver oil-cocoa butter (PREPARATION H) 0.25-3-85.5 % suppository, Place 1 suppository rectally daily as needed for hemorrhoids., Disp: , Rfl:    tamsulosin (FLOMAX) 0.4 MG CAPS capsule, Take 0.8 mg by mouth daily with supper., Disp: , Rfl:    Tiotropium Bromide Monohydrate (SPIRIVA RESPIMAT) 2.5 MCG/ACT AERS, Inhale 2 puffs into the lungs daily., Disp: 4 g, Rfl: 3   vitamin B-12 (CYANOCOBALAMIN) 1000 MCG tablet, Take 1,000 mcg by mouth in the morning., Disp: , Rfl:   Exam: Current vital signs: BP (!) 110/55 (BP Location: Left Arm)    Pulse 64    Temp  98 F (36.7 C) (Oral)    Resp 18    SpO2 99%  Vital signs in last 24 hours: Temp:  [97.9 F (36.6 C)-98.7 F (37.1 C)] 98 F (36.7 C) (01/04 1535) Pulse Rate:  [59-78] 64 (01/04 1535) Resp:  [16-18] 18 (01/04 1535) BP: (107-145)/(55-93) 110/55 (01/04 1535) SpO2:  [94 %-100 %] 99 % (01/04 1535)  GENERAL: Awake, alert, in no acute distress Psych: Affect appropriate for situation, patient is calm and cooperative with examination Head: Normocephalic and atraumatic, without obvious abnormality EENT: Normal conjunctivae, dry mucous membranes, no OP obstruction LUNGS: Normal respiratory effort. Non-labored breathing on room air CV: Regular rate and rhythm on telemetry ABDOMEN: Soft, non-tender, non-distended Extremities: warm, well perfused. Right knee with surgical wound and sutures in place, covered by gauze dressing.   NEURO:  Mental Status: Awake, alert, and oriented to person, place, time, and situation. He is able to provide a clear and coherent history of present illness. Speech/Language: speech is intact without dysarthria.   Naming, repetition, fluency, and comprehension intact without aphasia. No neglect is noted Cranial Nerves:  II: PERRL 3 mm/brisk. Visual fields full.  III, IV, VI: EOMI without ptosis, nystagmus, or gaze preference V: Sensation is intact to light touch and symmetrical to face.  VII: Face is symmetric resting and smiling.  VIII: Hearing is intact to voice IX, X: Palate elevation is symmetric. Phonation normal.  XI: Normal sternocleidomastoid and trapezius muscle strength XII: Tongue protrudes midline without fasciculations.   Motor: 5/5 strength present in bilateral upper extremities and right lower extremity.  There is significant weakness in left lower extremity. Patient is able to elevate the left lower extremity briefly off of the bed but it quickly drops.  Tone is normal. Bulk is normal.  Sensation: Intact to light touch bilaterally in all four  extremities with the exception of the left shin on the right lower extremity with numbness from surgery.   Coordination: No obvious dysmetria noted.  Gait: Deferred for patient safety with reports of multiple recent falls.   NIHSS: 1a Level of Conscious.: 0 1b LOC Questions: 0 1c LOC Commands: 0 2 Best Gaze: 0 3 Visual: 0 4 Facial Palsy: 0 5a Motor Arm - left: 0 5b Motor Arm - Right: 0 6a Motor Leg - Left: 2 6b Motor Leg - Right: 0 7 Limb Ataxia: 0 8 Sensory: 0 9 Best Language: 0 10 Dysarthria: 0 11 Extinct. and Inatten.: 0 TOTAL:  2  Labs I have reviewed labs in epic and the results pertinent to this consultation are: CBC    Component Value Date/Time   WBC 4.2 11/19/2021 2038   RBC 3.21 (L) 11/19/2021 2038   HGB 9.9 (L) 11/19/2021 2038   HGB 11.1 (L) 09/25/2021 1412   HGB 12.7 (L) 04/16/2021 1122   HCT 31.0 (L) 11/19/2021 2038   HCT 37.5 04/16/2021 1122   PLT 184 11/19/2021 2038   PLT 93 (L) 09/25/2021 1412   PLT 120 (L) 04/16/2021 1122   MCV 96.6 11/19/2021 2038   MCV 94 04/16/2021 1122   MCH 30.8 11/19/2021 2038   MCHC 31.9 11/19/2021 2038   RDW 13.7 11/19/2021 2038   RDW 12.5 04/16/2021 1122   LYMPHSABS 1.2 11/19/2021 2038   MONOABS 0.4 11/19/2021 2038   EOSABS 0.1 11/19/2021 2038   BASOSABS 0.0 11/19/2021 2038   CMP     Component Value Date/Time   NA 135 11/19/2021 2038   NA 139 04/16/2021 1122   K 4.4 11/19/2021 2038   CL 103 11/19/2021 2038   CO2 26 11/19/2021 2038   GLUCOSE 233 (H) 11/19/2021 2038   BUN 17 11/19/2021 2038   BUN 22 04/16/2021 1122   CREATININE 1.12 11/19/2021 2038   CREATININE 1.52 (H) 09/25/2021 1412   CALCIUM 9.0 11/19/2021 2038   PROT 7.2 11/19/2021 2038   PROT 6.7 11/30/2020 0848   ALBUMIN 3.8 11/19/2021 2038   ALBUMIN 4.3 11/30/2020 0848   AST 15 11/19/2021 2038   AST 14 (L) 09/25/2021 1412   ALT 11 11/19/2021 2038   ALT 12 09/25/2021 1412   ALKPHOS 68 11/19/2021 2038   BILITOT 1.6 (H) 11/19/2021 2038   BILITOT 2.2  (H) 09/25/2021 1412   GFRNONAA >60 11/19/2021 2038   GFRNONAA 50 (L) 09/25/2021 1412   GFRAA 61 11/30/2020 0848   GFRAA >60 03/08/2020 1244   Lipid Panel     Component Value Date/Time   CHOL 158 08/22/2020 1044   TRIG 88 08/22/2020 1044   HDL 35 (L) 08/22/2020 1044   CHOLHDL 4.5 08/22/2020 1044   LDLCALC 106 (H) 08/22/2020 1044   No results found for: HGBA1C  Imaging I have reviewed the images obtained:  MRI brain 1/4: Many scattered acute or early subacute infarcts in bilateral frontal and parietal lobes, left temporal and occipital lobes, left aspect of the splenium of the corpus callosum, and left cerebellum. Given involvement of multiple vascular territories, consider a central embolic etiology. Associated edema without significant mass effect.  MRI lumbar spine 1/4: 1. Diffuse disc bulge with superimposed central protrusion at L4-L5 resulting in crowding of the subarticular zones with possible mass effect on the traversing left L5 nerve root. 2. Mild disc bulge at L3-L4 resulting in crowding of the subarticular zones with possible mass effect on the traversing left L4 nerve root. 3. Otherwise, no high-grade spinal canal or neural foraminal stenosis, and no other evidence of nerve root impingement. 4. Multilevel facet arthropathy, most advanced at L3-L4, with small facet joint effusions bilaterally at L2-L3 and on the left at L3-L4 and L4-L5. 5. Unchanged 3.2 cm infrarenal abdominal aortic aneurysm. Follow-up imaging is recommended every 3 years.  CT head 1/4: 1. 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 Right side symptoms would result. 2. Otherwise negative for age noncontrast CT appearance of the brain. 3. Minor paranasal sinus mucosal thickening.  Assessment: 66 y.o. male who presented to the ED for evaluation of left lower extremity  weakness since 12/29 with progression over the last 2 days with dragging of the left lower extremity with walking.  MRI brain with evidence of multiple scattered infarcts in bilateral hemispheres concerning for an embolic etiology of stroke. - Examination reveals patient with left lower extremity weakness without further neurological deficits. His initial NIHSS is a 2.  - Patient was not a candidate for TNK due to last known well > 6 days prior to hospital arrival. Not a candidate for IR as presentation is not consistent with LVO and last known well > 6 days prior to hospital arrival. - Stroke risk factors include CAD, HTN, HLD, and OSA.  Recommendations: - HgbA1c, fasting lipid panel - CT angio head and neck - Frequent neuro checks - Echocardiogram - ASA 81mg  daily + plavix 75mg  daily x21 days f/b ASA 81mg  daily monotherapy after that - Risk factor modification - Telemetry monitoring; consider 30 day event monitor if no arrhythmias captured inpatient - PT consult, OT consult, Speech consult - Stroke team to follow  Pt seen by NP/Neuro and later by MD. Note/plan to be edited by MD as needed.  Austin Oliver, AGAC-NP [facility] Pager: [phone]  Neurology Attending Attestation   I examined the patient and discussed plan with Ms. Toberman NP. Above note has been edited by me to reflect my findings and recommendations. I personally reviewed all CNS imaging. 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 Suspect occult a fib. Dual antiplatelet for now pending investigation for central embolic source. Stroke team will continue to follow.   Austin Monks, MD [facility] [phone]   If 7pm- 7am, please page neurology on call as listed in [place].

## 2021-11-20 NOTE — Progress Notes (Signed)
Per order, Changed device settings for MRI to  DOO at 85 bpm  Will program device back to pre-MRI settings after completion of exam, and send transmission 

## 2021-11-20 NOTE — ED Notes (Signed)
Got patient on the monitor got patient vitals patient is resting with call bell in reach

## 2021-11-20 NOTE — Addendum Note (Signed)
Addendum  created 11/20/21 1642 by Duane Boston, MD   Clinical Note Signed, Intraprocedure Blocks edited, SmartForm saved

## 2021-11-21 ENCOUNTER — Inpatient Hospital Stay (HOSPITAL_COMMUNITY): Payer: Medicare Other

## 2021-11-21 DIAGNOSIS — I639 Cerebral infarction, unspecified: Secondary | ICD-10-CM

## 2021-11-21 DIAGNOSIS — U071 COVID-19: Secondary | ICD-10-CM

## 2021-11-21 LAB — CBC
HCT: 28.8 % — ABNORMAL LOW (ref 39.0–52.0)
Hemoglobin: 9 g/dL — ABNORMAL LOW (ref 13.0–17.0)
MCH: 30.5 pg (ref 26.0–34.0)
MCHC: 31.3 g/dL (ref 30.0–36.0)
MCV: 97.6 fL (ref 80.0–100.0)
Platelets: 147 10*3/uL — ABNORMAL LOW (ref 150–400)
RBC: 2.95 MIL/uL — ABNORMAL LOW (ref 4.22–5.81)
RDW: 13.8 % (ref 11.5–15.5)
WBC: 2.7 10*3/uL — ABNORMAL LOW (ref 4.0–10.5)
nRBC: 0 % (ref 0.0–0.2)

## 2021-11-21 LAB — COMPREHENSIVE METABOLIC PANEL
ALT: 10 U/L (ref 0–44)
AST: 14 U/L — ABNORMAL LOW (ref 15–41)
Albumin: 3.2 g/dL — ABNORMAL LOW (ref 3.5–5.0)
Alkaline Phosphatase: 62 U/L (ref 38–126)
Anion gap: 7 (ref 5–15)
BUN: 15 mg/dL (ref 8–23)
CO2: 24 mmol/L (ref 22–32)
Calcium: 8.7 mg/dL — ABNORMAL LOW (ref 8.9–10.3)
Chloride: 106 mmol/L (ref 98–111)
Creatinine, Ser: 1.11 mg/dL (ref 0.61–1.24)
GFR, Estimated: >60 mL/min (ref >60)
Glucose, Bld: 167 mg/dL — ABNORMAL HIGH (ref 70–99)
Potassium: 3.9 mmol/L (ref 3.5–5.1)
Sodium: 137 mmol/L (ref 135–145)
Total Bilirubin: 1.7 mg/dL — ABNORMAL HIGH (ref 0.3–1.2)
Total Protein: 6.1 g/dL — ABNORMAL LOW (ref 6.5–8.1)

## 2021-11-21 LAB — LIPID PANEL
Cholesterol: 66 mg/dL (ref 0–200)
HDL: 12 mg/dL — ABNORMAL LOW (ref >40)
LDL Cholesterol: 25 mg/dL (ref 0–99)
Total CHOL/HDL Ratio: 5.5 RATIO
Triglycerides: 146 mg/dL (ref <150)
VLDL: 29 mg/dL (ref 0–40)

## 2021-11-21 LAB — HEMOGLOBIN A1C
Hgb A1c MFr Bld: 6.8 % — ABNORMAL HIGH (ref 4.8–5.6)
Mean Plasma Glucose: 148.46 mg/dL

## 2021-11-21 LAB — HIV ANTIBODY (ROUTINE TESTING W REFLEX): HIV Screen 4th Generation wRfx: NONREACTIVE

## 2021-11-21 MED ORDER — IPRATROPIUM-ALBUTEROL 0.5-2.5 (3) MG/3ML IN SOLN: 3.0000 mL | Freq: Three times a day (TID) | RESPIRATORY_TRACT | Status: DC

## 2021-11-21 MED ORDER — CLOPIDOGREL BISULFATE 75 MG PO TABS
75.0000 mg | ORAL_TABLET | Freq: Every day | ORAL | Status: DC
  Administered 2021-11-21 – 2021-11-23 (×3): 75 mg via ORAL
  Filled 2021-11-21 (×3): qty 1

## 2021-11-21 MED ORDER — PREDNISONE 5 MG PO TABS
50.0000 mg | ORAL_TABLET | Freq: Every day | ORAL | Status: DC
  Administered 2021-11-22: 50 mg via ORAL
  Filled 2021-11-21: qty 2

## 2021-11-21 MED ORDER — METHYLPREDNISOLONE SODIUM SUCC 40 MG IJ SOLR
40.0000 mg | Freq: Once | INTRAMUSCULAR | Status: AC
  Administered 2021-11-21: 40 mg via INTRAVENOUS
  Filled 2021-11-21: qty 1

## 2021-11-21 MED ORDER — LIVING WELL WITH DIABETES BOOK
Freq: Once | Status: DC
  Filled 2021-11-21: qty 1

## 2021-11-21 MED ORDER — IPRATROPIUM-ALBUTEROL 20-100 MCG/ACT IN AERS
1.0000 | INHALATION_SPRAY | Freq: Four times a day (QID) | RESPIRATORY_TRACT | Status: DC
  Administered 2021-11-21 – 2021-11-23 (×6): 1 via RESPIRATORY_TRACT
  Filled 2021-11-21: qty 4

## 2021-11-21 NOTE — Progress Notes (Addendum)
STROKE TEAM PROGRESS NOTE   INTERVAL HISTORY His son is at the bedside. Patient is hemodynamically stable. He has had multiple falls over the last two weeks and tested positive for COVID. He finished outpatient oral antiviral treatment with molnupirvir. Plan for venous duplex, echo, TEE, and pacemaker interrogation. Additionally  MRI spine showed diffuse disc bulge, no high grade spinal canal or neural foraminal stenosis , no evidence of nerve root impingement. MRI scan of the brain showed bilateral embolic strokes within subacute left parieto-occipital subcortical infarct.  CT angiogram shows left P2 stenosis.  Echocardiogram shows ejection fraction 55 to 60%.  Hemoglobin A1c 6.8. Vitals:   11/21/21 0100 11/21/21 0300 11/21/21 0400 11/21/21 0500  BP: 115/60 115/73 112/66 115/61  Pulse: 60 66 67 65  Resp: 19 18 12 17   Temp:      TempSrc:      SpO2: 94% 97% 99% 100%   CBC:  Recent Labs  Lab 11/19/21 2038 11/21/21 0328  WBC 4.2 2.7*  NEUTROABS 2.5  --   HGB 9.9* 9.0*  HCT 31.0* 28.8*  MCV 96.6 97.6  PLT 184 027*   Basic Metabolic Panel:  Recent Labs  Lab 11/19/21 2038 11/21/21 0328  NA 135 137  K 4.4 3.9  CL 103 106  CO2 26 24  GLUCOSE 233* 167*  BUN 17 15  CREATININE 1.12 1.11  CALCIUM 9.0 8.7*   Lipid Panel: No results for input(s): CHOL, TRIG, HDL, CHOLHDL, VLDL, LDLCALC in the last 168 hours. HgbA1c:  Recent Labs  Lab 11/21/21 0328  HGBA1C 6.8*   Urine Drug Screen: No results for input(s): LABOPIA, COCAINSCRNUR, LABBENZ, AMPHETMU, THCU, LABBARB in the last 168 hours.  Alcohol Level No results for input(s): ETH in the last 168 hours.  IMAGING past 24 hours CT ANGIO HEAD NECK W WO CM  Result Date: 11/21/2021 CLINICAL DATA:  Follow-up examination for acute stroke. EXAM: CT ANGIOGRAPHY HEAD AND NECK TECHNIQUE: Multidetector CT imaging of the head and neck was performed using the standard protocol during bolus administration of intravenous contrast. Multiplanar CT  image reconstructions and MIPs were obtained to evaluate the vascular anatomy. Carotid stenosis measurements (when applicable) are obtained utilizing NASCET criteria, using the distal internal carotid diameter as the denominator. CONTRAST:  140mL OMNIPAQUE IOHEXOL 350 MG/ML SOLN COMPARISON:  Previous MRI from earlier the same day. FINDINGS: CT HEAD FINDINGS Brain: Cerebral volume within normal limits. Previously identified scattered acute ischemic infarcts are largely occult by CT. No associated hemorrhage or mass effect. No other acute large vessel territory infarct or intracranial hemorrhage. No mass lesion, mass effect or midline shift. No hydrocephalus or extra-axial fluid collection. Vascular: No hyperdense vessel. Calcified atherosclerosis present at the skull base. Skull: Scalp soft tissues and calvarium within normal limits. Sinuses: Scattered mucosal thickening noted about the ethmoidal air cells and maxillary sinuses. Mastoid air cells are clear. Orbits: Globes orbital soft tissues demonstrate no acute finding. Review of the MIP images confirms the above findings CTA NECK FINDINGS Aortic arch: Visualized aortic arch normal caliber with normal branch pattern. No stenosis about the origin of the great vessels. Mild atheromatous change about the visualized proximal descending intrathoracic aorta. Right carotid system: Right CCA patent without stenosis. Scattered atheromatous change about the right carotid bulb/proximal right ICA without hemodynamically significant stenosis. Right ICA patent distally without stenosis or dissection. Left carotid system: Left CCA patent from its origin to the bifurcation. Mixed but predominant soft plaque about the left carotid bulb/proximal left ICA with estimated 40%  stenosis by NASCET criteria. Left ICA patent distally without stenosis or dissection. Vertebral arteries: Both vertebral arteries arise from the subclavian arteries. No proximal subclavian artery stenosis.  Atheromatous plaque at the origins of both vertebral arteries with moderate approximate 50% stenoses bilaterally. Vertebral arteries otherwise patent distally without stenosis or dissection. Skeleton: No discrete or worrisome osseous lesions. Prior fusion at C5-6. Adjacent segment disease with moderate spondylosis at C6-7. Other neck: No other acute soft tissue abnormality within the neck. Upper chest: Visualized upper chest demonstrates no acute finding. Left-sided pacemaker/AICD noted. Review of the MIP images confirms the above findings CTA HEAD FINDINGS Anterior circulation: Petrous segments patent bilaterally. Scattered atheromatous plaque within the carotid siphons without hemodynamically significant stenosis. A1 segments patent bilaterally. Normal anterior communicating artery complex. Both ACAs patent to their distal aspects without stenosis. Left ACA is strongly dominant, with a hypoplastic right ACA. No M1 stenosis or occlusion. Normal MCA bifurcations. Distal MCA branches perfused and symmetric. Posterior circulation: Both V4 segments patent to the vertebrobasilar junction without stenosis. Both PICA origins patent and normal. Basilar patent to its distal aspect without stenosis. Superior cerebral arteries patent bilaterally. Both PCAs primarily supplied via the basilar. Right PCA widely patent to its distal aspect. There is a focal severe stenosis with near occlusion of the left PCA at the proximal left P2 segment (series 12, image 127). Scant irregular thready flow seen within the left PCA distally. Venous sinuses: Patent allowing for timing the contrast bolus. Markedly hypoplastic right transverse sinus noted. Anatomic variants: None significant.  No aneurysm. Review of the MIP images confirms the above findings IMPRESSION: CT HEAD IMPRESSION: 1. Previously identified scattered ischemic infarcts are largely occult by CT. No intracranial hemorrhage or significant mass effect. 2. No other new acute  intracranial abnormality. CTA HEAD AND NECK IMPRESSION: 1. Negative CTA for large vessel occlusion. 2. Focal severe near occlusive stenosis involving the proximal left P2 segment. Scant irregular thready flow within the left PCA distribution distally. 3. 40% atheromatous stenosis about the proximal left ICA. 4. Moderate approximate 50% stenoses at the origins of both vertebral arteries. Electronically Signed   By: Jeannine Boga M.D.   On: 11/21/2021 03:23   MR BRAIN WO CONTRAST  Result Date: 11/20/2021 CLINICAL DATA:  Neuro deficit, acute, stroke suspected EXAM: MRI HEAD WITHOUT CONTRAST TECHNIQUE: Multiplanar, multiecho pulse sequences of the brain and surrounding structures were obtained without intravenous contrast. COMPARISON:  CT head from the same day. FINDINGS: Brain: Many scattered acute or early subacute infarcts in bilateral frontal and parietal lobes, left temporal and occipital lobes, left aspect of the splenium of the corpus callosum, and left cerebellum. The majority of these infarcts are small, although there are some areas of somewhat confluent infarct in the left parietal and occipital lobes and left aspect of the splenium corpus callosum. Associated edema without significant mass effect. No evidence of acute hemorrhage, hydrocephalus, midline shift, extra-axial fluid collection, or mass lesion. Vascular: Major arterial flow voids are maintained skull base. Skull and upper cervical spine: Normal marrow signal. Sinuses/Orbits: Mild paranasal sinus mucosal thickening. Unremarkable orbits. Other: Trace bilateral mastoid effusions.  The IMPRESSION: Many scattered acute or early subacute infarcts in bilateral frontal and parietal lobes, left temporal and occipital lobes, left aspect of the splenium of the corpus callosum, and left cerebellum. Given involvement of multiple vascular territories, consider a central embolic etiology. Associated edema without significant mass effect. Electronically  Signed   By: Margaretha Sheffield M.D.   On: 11/20/2021 14:40  MR Lumbar Spine W Wo Contrast  Result Date: 11/20/2021 CLINICAL DATA:  Weakness in left leg, multiple falls in the past 2 weeks EXAM: MRI LUMBAR SPINE WITHOUT AND WITH CONTRAST TECHNIQUE: Multiplanar and multiecho pulse sequences of the lumbar spine were obtained without and with intravenous contrast. CONTRAST:  76mL GADAVIST GADOBUTROL 1 MMOL/ML IV SOLN COMPARISON:  Lumbar spine radiographs 11/20/2021 FINDINGS: Segmentation: Transitional anatomy is noted there is sacralization of the L5 vertebral body. For the purposes of this report, the lowest rudimentary disc space is designated L5-S1 which is in keeping with a lumbar spine radiographs from earlier the same day. Alignment:  Normal. Vertebrae: Vertebral body heights are preserved. There is mild degenerative marrow signal abnormality at L2-L3. Small Schmorl's nodes are noted along the superior L1 and L2 endplates. There is no suspicious marrow signal abnormality. There is no abnormal marrow enhancement. Conus medullaris and cauda equina: Conus extends to the mid L1 level. Conus and cauda equina appear normal. There is no abnormal enhancement of the conus or cauda equina nerve roots. Paraspinal and other soft tissues: There is mild aneurysmal dilation of the infrarenal abdominal aorta measuring up to 3.2 cm, unchanged compared to the prior abdominal CT of 07/17/2021. Signal abnormality in the right kidney likely reflects the prominent renal stone as seen on prior CT. Disc levels: There is mild disc desiccation and narrowing at L4-L5. The other disc spaces are overall preserved. There is mild multilevel facet arthropathy, most advanced at L3-L4. Small facet joint effusions are noted bilaterally at L2-L3 and on the left at L3-L4 and L4-L5. T12-L1: No significant spinal canal or neural foraminal stenosis L1-L2: There is a mild disc bulge and mild bilateral facet arthropathy resulting in mild left and no  significant right neural foraminal stenosis. L2-L3: There is a mild disc bulge and mild bilateral facet arthropathy resulting in mild bilateral neural foraminal stenosis and mild crowding of the left subarticular zone without evidence of frank nerve root impingement. L3-L4: There is a diffuse disc bulge with bilateral foraminal components, ligamentum flavum thickening, and bilateral facet arthropathy resulting in mild spinal canal stenosis with crowding of the bilateral subarticular zones and possible mass effect on the traversing left L4 nerve root, and mild bilateral neural foraminal stenosis. L4-L5: There is a diffuse disc bulge with a superimposed central protrusion and mild bilateral facet arthropathy resulting in crowding of the bilateral subarticular zones with possible mass effect on the traversing left L5 nerve root and mild right and no significant left neural foraminal stenosis. L5-S1: No significant spinal canal or neural foraminal stenosis. IMPRESSION: 1. Diffuse disc bulge with superimposed central protrusion at L4-L5 resulting in crowding of the subarticular zones with possible mass effect on the traversing left L5 nerve root. 2. Mild disc bulge at L3-L4 resulting in crowding of the subarticular zones with possible mass effect on the traversing left L4 nerve root. 3. Otherwise, no high-grade spinal canal or neural foraminal stenosis, and no other evidence of nerve root impingement. 4. Multilevel facet arthropathy, most advanced at L3-L4, with small facet joint effusions bilaterally at L2-L3 and on the left at L3-L4 and L4-L5. 5. Unchanged 3.2 cm infrarenal abdominal aortic aneurysm. Follow-up imaging is recommended every 3 years. Electronically Signed   By: Valetta Mole M.D.   On: 11/20/2021 15:07   DG CHEST PORT 1 VIEW  Result Date: 11/20/2021 CLINICAL DATA:  cough Per triage: Pt complains of increased weakness to his left leg. Pt states that he has had multiple falls in  the past 2 weeks due to  his left leg giving out. EXAM: PORTABLE CHEST 1 VIEW COMPARISON:  Chest x-ray 12/04/2020, CT chest 10/08/2021 FINDINGS: The heart and mediastinal contours are unchanged. Left chest wall to the pacemaker noted in similar position. No focal consolidation. No pulmonary edema. No pleural effusion. No pneumothorax. No acute osseous abnormality. IMPRESSION: No active disease. Electronically Signed   By: Iven Finn M.D.   On: 11/20/2021 18:32   ECHOCARDIOGRAM COMPLETE  Result Date: 11/20/2021    ECHOCARDIOGRAM REPORT   Patient Name:   RONDEL EPISCOPO Date of Exam: 11/20/2021 Medical Rec #:  [id number]      Height:       68.5 in Accession #:    [id number]     Weight:       234.2 lb Date of Birth:  [birth year]      BSA:          2.198 m Patient Age:    66 years       BP:           110/55 mmHg Patient Gender: M              HR:           65 bpm. Exam Location:  Inpatient Procedure: 2D Echo, Cardiac Doppler and Color Doppler Indications:    Stroke  History:        Patient has prior history of Echocardiogram examinations, most                 recent 10/02/2020. CAD; Risk Factors:Hypertension, Dyslipidemia                 and Former Smoker. Covid 19 positive.  Sonographer:    Merrie Roof RDCS Referring Phys: [id number] Alpha  1. Left ventricular ejection fraction, by estimation, is 50 to 55%. The left ventricle has normal function. The left ventricle has no regional wall motion abnormalities. There is mild concentric left ventricular hypertrophy.  2. Right ventricular systolic function is normal. The right ventricular size is normal.  3. Left atrial size was mildly dilated.  4. The mitral valve is normal in structure. Trivial mitral valve regurgitation.  5. The aortic valve is tricuspid. There is mild calcification of the aortic valve. There is mild thickening of the aortic valve. Aortic valve regurgitation is not visualized. Aortic valve sclerosis/calcification is present, without any evidence of aortic  stenosis.  6. The inferior vena cava is normal in size with greater than 50% respiratory variability, suggesting right atrial pressure of 3 mmHg. Comparison(s): Compared to prior TTE 09/2020, there is no significant change. Conclusion(s)/Recommendation(s): No intracardiac source of embolism detected on this transthoracic study. Consider a transesophageal echocardiogram to exclude cardiac source of embolism if clinically indicated. FINDINGS  Left Ventricle: Left ventricular ejection fraction, by estimation, is 50 to 55%. The left ventricle has normal function. The left ventricle has no regional wall motion abnormalities. The left ventricular internal cavity size was normal in size. There is  mild concentric left ventricular hypertrophy. Right Ventricle: The right ventricular size is normal. No increase in right ventricular wall thickness. Right ventricular systolic function is normal. Left Atrium: Left atrial size was mildly dilated. Right Atrium: Right atrial size was normal in size. Pericardium: There is no evidence of pericardial effusion. Mitral Valve: The mitral valve is normal in structure. There is mild thickening of the mitral valve leaflet(s). There is mild calcification of the mitral valve leaflet(s). Mild mitral  annular calcification. Trivial mitral valve regurgitation. Tricuspid Valve: The tricuspid valve is normal in structure. Tricuspid valve regurgitation is trivial. Aortic Valve: The aortic valve is tricuspid. There is mild calcification of the aortic valve. There is mild thickening of the aortic valve. Aortic valve regurgitation is not visualized. Aortic valve sclerosis/calcification is present, without any evidence of aortic stenosis. Pulmonic Valve: The pulmonic valve was normal in structure. Pulmonic valve regurgitation is trivial. Aorta: The aortic root is normal in size and structure. Venous: The inferior vena cava is normal in size with greater than 50% respiratory variability, suggesting right  atrial pressure of 3 mmHg. IAS/Shunts: The atrial septum is grossly normal. Additional Comments: A device lead is visualized.  LEFT VENTRICLE PLAX 2D LVIDd:         4.10 cm LVIDs:         2.90 cm LV PW:         1.30 cm LV IVS:        1.10 cm  LEFT ATRIUM             Index        RIGHT ATRIUM           Index LA diam:        4.40 cm 2.00 cm/m   RA Area:     12.60 cm LA Vol (A2C):   49.9 ml 22.71 ml/m  RA Volume:   27.80 ml  12.65 ml/m LA Vol (A4C):   50.0 ml 22.75 ml/m LA Biplane Vol: 53.3 ml 24.25 ml/m   AORTA Ao Root diam: 3.30 cm Gwyndolyn Kaufman MD Electronically signed by Gwyndolyn Kaufman MD Signature Date/Time: 11/20/2021/6:07:26 PM    Final     PHYSICAL EXAM  Physical Exam  Constitutional: Appears obese middle-aged Caucasian male Psych: Affect appropriate to situation Cardiovascular: Normal rate and regular rhythm.  Respiratory: Effort normal, non-labored breathing  Neuro: Mental Status: Patient is awake, alert, oriented to person, place, month, year, and situation. Patient is able to give a clear and coherent history. No signs of aphasia or neglect Cranial Nerves: II: Visual Fields are full. Pupils are equal, round, and reactive to light.   III,IV, VI: EOMI without ptosis or diploplia.  V: Facial sensation is symmetric to temperature VII: Facial movement is symmetric resting and smiling VIII: Hearing is intact to voice X: Palate elevates symmetrically XI: Shoulder shrug is symmetric. XII: Tongue protrudes midline without atrophy or fasciculations.  Motor: 5/5 strength present in bilateral upper extremities and right lower extremity.  There is weakness in left lower extremity. Patient is able to elevate the left lower extremity briefly.  Sensory: Sensation is symmetric to light touch and temperature in the arms and legs. No extinction to DSS present.  Coordination: FNF and HKS are intact bilaterally Gait:  Deferred, multiple recent falls  ASSESSMENT/PLAN [name] is a 66 y.o. male with a medical history significant for sick sinus syndrome s/p AICD, COPD, cirrhosis of the liver, coronary artery calcification, essential hypertension, hyperlipidemia, obstructive sleep apnea on CPAP, and osteoarthritis who initially presented to [facility] on 1/4 for evaluation of left lower extremity weakness since Thursday, December 29. Patient recently had a right knee replacement on 12/15 with spinal anesthesia and has been in physical therapy following his procedure. He has been walking with a walker at home since his knee replacement. He noted that at his last PT appointment, he felt weak on the left leg. Over the past 2 days patient states that he  has been dragging his left leg and has had two falls due to his weakness prompting him to be evaluated. Patient was also found recently to be COVID positive and states that since he started taking his medication, for the first 1-2 days, when he woke up he felt disoriented and confused and that this took some time to clear. He denies current confusion or disorientation. Patient's only other complaint is issues with initiating his urine flow since his surgery. Patient was subsequently transferred to [facility] ED to undergo MRI imaging with AICD revealing multiple acute/subacute infarcts in bilateral hemispheres concerning for embolic etiology. [employer] rep contacted to interrogate pacemaker. Tentative plan for TEE midweek due to covid pos. Test.   Stroke:  scattered acute and subacute infarcts in bilateral frontal, parietal, temporal, and occipital lobes infarct -etiology indeterminate in the setting of recent acute COVID infection Code Stroke: small vessel disease   CTA head & neck - focal area of narrowing of the P2 segment MRI  Many scattered acute or early subacute infarcts in bilateral frontal and parietal lobes, left temporal and occipital lobes, left aspect of the splenium of the corpus callosum, and left cerebellum 2D Echo EF  50-55% Venous duplex lower extremity pending LDL 106 HgbA1c 6.8 VTE prophylaxis - Lovenox aspirin 81 mg daily prior to admission, now on aspirin 81 mg daily and clopidogrel 75 mg daily for 3 weeks and then plavix alone Therapy recommendations:  pending Disposition:  pending  Hypertension Home meds:  None Stable Permissive hypertension (OK if < 220/120) but gradually normalize in 5-7 days Long-term BP goal normotensive  Hyperlipidemia Home meds:  zetia, , crestor 5mg  resumed in hospital LDL 106, goal < 70 Consider increasing crestor, patient does have a statin allergy Continue statin at discharge  Diabetes Mellitus Type 2, new diagnosis, patient and son are made aware of the diagnosis  No home medication A1c 6.8 Carb modified diet for now SSI CBG Diabetes /diet education   Other Stroke Risk Factors Advanced Age >/= 39  Hx stroke/TIA Coronary artery disease Imdur Pacemaker for sick sinus syndrome and bradycardia [employer] to interrogate pacemaker OSA- resume cpap use Has not used cpap since knee surgery  Other Active Problems Back Pain- per primar MRI spine showed diffuse disc bulge, no high grade spinal canal or neural foraminal stenosis , no evidence of nerve root impingement, detail please refer to original MRI spine Multiple Falls PT/OT evaluation COPD with mild copd exacerbation- manage per primary   Has bilateral wheezing, no acute infiltrate on CXR Quit smoking in 2018 Nebs, mucinex, steroids Covid 19 Virus - per primary Afebrile Finished oral treatment Some productive cough Some wheezing, no hypoxia, denies sob, cxr no acute findings,on  mucinex, nebs Normocytic anemia- per primary Hgb 9.9 History of B12 deficiency- continue B12 Continue B12 Right knee replacement on 12/15 Venous duplex ordered BPH- per primary Proscar and flomax   Hospital day # 1  Patient seen and examined by NP/APP with MD. MD to update note as needed.   Janine Ores, DNP,  FNP-BC [facility] Pager: [phone]  STROKE MD NOTE : I have personally obtained history,examined this patient, reviewed notes, independently viewed imaging studies, participated in medical decision making and plan of care.ROS completed by me personally and pertinent positives fully documented  I have made any additions or clarifications directly to the above note. Agree with note above.  Patient presented with left leg weakness and fall following recent knee surgery and COVID infection and MRI  scan shows multiple bilateral Bolick infarcts of indeterminate etiology.  Recommend further evaluation by checking lower extremity venous Dopplers for DVT and may need loop recorder at discharge for paroxysmal A. fib.  Continue cardiac monitoring and treatment for COVID as per medical hospitalist.  Continue aspirin Plavix for 3 weeks followed by Plavix alone and aggressive risk factor modification.  Greater than 50% time during this prolonged 50-minute visit was spent in counseling and coordination of care discussion about his stroke and stroke evaluation .Discussed with Dr. Larey Days, MD Medical Director [facility] Pager: [phone] 11/21/2021 3:13 PM   To contact Stroke Continuity provider, please refer to [email or web address] After hours, contact General Neurology

## 2021-11-21 NOTE — ED Notes (Signed)
US at bedside

## 2021-11-21 NOTE — Progress Notes (Addendum)
PROGRESS NOTE    [name]  RJJ:[id number] DOB: [birth year] DOA: 11/19/2021 PCP: [name], MD    Chief Complaint  Patient presents with   Weakness   Extremity Weakness    Brief Narrative:  EDVIN Oliver is a 66 y.o. 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 On asa, plavix, zetia ,crestor at home, continue Hold bp meds to allow permission hypertension  Neurology consulted ,will follow recommendation    Diabetes, new diagnosis, patient and son are made aware of the diagnosis  Present with hyperglycemia A1c 6.8 Carb modified diet for now Sliding scale insulin. Monitor blood glucose while on steroid Diabetes /diet education    Low back pain MRI spine showed diffuse disc bulge, no high grade spinal canal or neural foraminal stenosis , no evidence of nerve root impingement, detail please refer to original MRI spine Case discussed with neuro spine surgeon Dr. Ellene Route who recommend conservative management and follow-up outpatient as needed Analgesic and physical therapy    CAD s/p DES to LAD in 08/2020 Denies chest pain Continue home medication   SSS s/p  PPM   HTN Allow permissive hypertension   COPD with mild copd exacerbation, Has bilateral wheezing, Quit smoking in 2018  cxr no acute infiltrate Increase  nebs, mucinex, Start steroids   Covid 19 Finished oral treatment Some productive cough Some wheezing, no hypoxia, denies sob, cxr no acute findings,on  mucinex, nebs    Normocytic anemia Suspect had blood loss anemia from recent surgery, as hemoglobin.  On 12/16 was 9.1, hemoglobin today is 9.9 History of B12 deficiency No overt sign of external bleed Continue B12 iron level in 09/2021 was unremarkable Check FOBT   Hypothyroidism, continue Synthroid   BPH  Continue on proscar and flomax Reports having more trouble initating and straining Will get bladder scan F/u with Dr Gloriann Loan   OSA, continue CPAP Reports has not use cpap since after knee surgery  Unchanged 3.2 cm infrarenal abdominal aortic aneurysm. Follow-up imaging is recommended every 3 years.   S/p right knee replacement on 12/15 Right lower leg edematous, venous doppler ordered  to rule out DVT  5:50pm addendum: Venous Doppler negative for DVT   Falls, fall prevention, PT eval    .    Unresulted Labs (From admission, onward)     Start     Ordered   11/27/21 0500  Creatinine, serum  (enoxaparin (LOVENOX)    CrCl < 30 ml/min)  Weekly,   R     Comments: while on enoxaparin therapy.    11/20/21 2246              DVT prophylaxis: enoxaparin (LOVENOX) injection 30  mg Start: 11/21/21 0800   Code Status:full Family Communication: son at bedside  Disposition:   Status is: Inpatient  Dispo: The patient is from: home              Anticipated d/c is to: home vs SNF pending therapy eval              Anticipated d/c date is: TBD, need neurology clearance                 Consultants:  neurology  Procedures:  none  Antimicrobials:   Anti-infectives (From admission, onward)    None           Objective: Vitals:   11/21/21 0400  11/21/21 0500 11/21/21 0900 11/21/21 0919  BP: 112/66 115/61  (!) 129/54  Pulse: 67 65 (!) 59 62  Resp: 12 17 16 19   Temp:      TempSrc:      SpO2: 99% 100% 95% 97%    Intake/Output Summary (Last 24 hours) at 11/21/2021 1409 Last data filed at 11/20/2021 2355 Gross per 24 hour  Intake --  Output 625 ml  Net -625 ml   There were no vitals filed for this visit.  Examination:  General exam: alert, awake, communicative,calm, NAD Respiratory system: + bilateral wheezing. Respiratory effort normal. Cardiovascular system:  RRR.  Gastrointestinal system: Abdomen is nondistended, soft and nontender.  Normal bowel sounds heard. Central nervous system: Alert and orientedx3, left leg weakness Extremities:  right knee post op changes, right lower extremity edema, left leg weakness, not able to lift against resistance  Skin: No rashes, lesions or ulcers Psychiatry: Judgement and insight appear normal. Mood & affect appropriate.     Data Reviewed: I have personally reviewed following labs and imaging studies  CBC: Recent Labs  Lab 11/19/21 2038 11/21/21 0328  WBC 4.2 2.7*  NEUTROABS 2.5  --   HGB 9.9* 9.0*  HCT 31.0* 28.8*  MCV 96.6 97.6  PLT 184 147*    Basic Metabolic Panel: Recent Labs  Lab 11/19/21 2038 11/21/21 0328  NA 135 137  K 4.4 3.9  CL 103 106  CO2 26 24  GLUCOSE 233* 167*  BUN 17 15  CREATININE 1.12 1.11  CALCIUM 9.0 8.7*    GFR: CrCl cannot be calculated (Unknown ideal weight.).  Liver Function Tests: Recent Labs  Lab 11/19/21 2038 11/21/21 0328  AST 15 14*  ALT 11 10  ALKPHOS 68 62  BILITOT 1.6* 1.7*  PROT 7.2 6.1*  ALBUMIN 3.8 3.2*    CBG: No results for input(s): GLUCAP in the last 168 hours.   Recent Results (from the past 240 hour(s))  Resp Panel by RT-PCR (Flu A&B, Covid) Nasopharyngeal Swab     Status: Abnormal   Collection Time: 11/20/21  2:53 PM   Specimen: Nasopharyngeal Swab; Nasopharyngeal(NP) swabs in vial transport medium   Result Value Ref Range Status   SARS Coronavirus 2 by RT PCR POSITIVE (A) NEGATIVE Final    Comment: (NOTE) SARS-CoV-2 target nucleic acids are DETECTED.  The SARS-CoV-2 RNA is generally detectable in upper respiratory specimens during the acute phase of infection. Positive results are indicative of the presence of the identified virus, but do not rule out bacterial infection or co-infection with other pathogens not detected by the test. Clinical correlation with patient history and other diagnostic information is necessary to determine patient infection status. The expected result is Negative.  Fact Sheet for Patients: EntrepreneurPulse.com.au  Fact Sheet for Healthcare Providers:  IncredibleEmployment.be  This test is not yet approved or cleared by the [place] and  has been authorized for detection and/or diagnosis of SARS-CoV-2 by FDA under an Emergency Use Authorization (EUA).  This EUA will remain in effect (meaning this test can be used) for the duration of  the COVID-19 declaration under Section 564(b)(1) of the A ct, 21 U.S.C. section 360bbb-3(b)(1), unless the authorization is terminated or revoked sooner.     Influenza A by PCR NEGATIVE NEGATIVE Final   Influenza B by PCR NEGATIVE NEGATIVE Final    Comment: (NOTE) The Xpert Xpress SARS-CoV-2/FLU/RSV plus assay is intended as an aid in the diagnosis of influenza from Nasopharyngeal swab specimens and should not be used as a sole basis for treatment. Nasal washings and aspirates are unacceptable for Xpert Xpress SARS-CoV-2/FLU/RSV testing.  Fact Sheet for Patients: EntrepreneurPulse.com.au  Fact Sheet for Healthcare Providers: IncredibleEmployment.be  This test is not yet approved or cleared by the [place] FDA and has been authorized for detection and/or diagnosis of SARS-CoV-2 by FDA under an Emergency Use Authorization (EUA).  This EUA will remain in effect (meaning this test can be used) for the duration of the COVID-19 declaration under Section 564(b)(1) of the Act, 21 U.S.C. section 360bbb-3(b)(1), unless the authorization is terminated or revoked.  Performed at [facility], [place] [address] [place], [place] [address]          Radiology Studies: CT ANGIO HEAD NECK W WO CM  Result Date: 11/21/2021 CLINICAL DATA:  Follow-up examination for acute stroke. EXAM: CT ANGIOGRAPHY HEAD AND NECK TECHNIQUE: Multidetector CT imaging of the head and neck was performed using the standard protocol during bolus administration of intravenous contrast. Multiplanar CT image reconstructions and MIPs were obtained to evaluate the vascular anatomy. Carotid stenosis measurements (when applicable) are obtained utilizing NASCET criteria, using the distal internal carotid diameter as the denominator. CONTRAST:  113mL OMNIPAQUE IOHEXOL 350 MG/ML SOLN COMPARISON:  Previous MRI from earlier the same day. FINDINGS: CT HEAD FINDINGS Brain: Cerebral volume within normal limits. Previously identified scattered acute ischemic infarcts are largely occult by CT. No associated hemorrhage or mass effect. No other acute large vessel territory infarct or intracranial hemorrhage. No mass lesion, mass effect or midline shift. No hydrocephalus or extra-axial fluid collection. Vascular: No hyperdense vessel. Calcified atherosclerosis present at the skull base. Skull: Scalp soft tissues and calvarium within normal limits. Sinuses: Scattered mucosal thickening noted about the ethmoidal air cells and maxillary sinuses. Mastoid air cells are clear. Orbits: Globes orbital soft tissues demonstrate no acute finding. Review of the MIP images confirms the above findings CTA NECK FINDINGS Aortic arch: Visualized aortic arch normal caliber with normal branch pattern. No stenosis about the origin of the great vessels. Mild atheromatous change about the visualized  proximal descending intrathoracic aorta. Right carotid system: Right CCA patent without stenosis. Scattered atheromatous change about the right carotid bulb/proximal right ICA without hemodynamically significant stenosis. Right ICA patent distally without stenosis or dissection. Left carotid system: Left CCA patent from its origin to the bifurcation. Mixed but predominant soft plaque about the left carotid bulb/proximal left ICA with estimated 40% stenosis by NASCET criteria. Left ICA patent distally without stenosis or dissection. Vertebral arteries: Both vertebral arteries arise from the subclavian arteries. No proximal subclavian artery stenosis. Atheromatous plaque at the origins of both vertebral arteries with moderate approximate 50% stenoses bilaterally. Vertebral arteries otherwise patent distally without stenosis or dissection. Skeleton: No discrete or worrisome osseous lesions. Prior  fusion at C5-6. Adjacent segment disease with moderate spondylosis at C6-7. Other neck: No other acute soft tissue abnormality within the neck. Upper chest: Visualized upper chest demonstrates no acute finding. Left-sided pacemaker/AICD noted. Review of the MIP images confirms the above findings CTA HEAD FINDINGS Anterior circulation: Petrous segments patent bilaterally. Scattered atheromatous plaque within the carotid siphons without hemodynamically significant stenosis. A1 segments patent bilaterally. Normal anterior communicating artery complex. Both ACAs patent to their distal aspects without stenosis. Left ACA is strongly dominant, with a hypoplastic right ACA. No M1 stenosis or occlusion. Normal MCA bifurcations. Distal MCA branches perfused and symmetric. Posterior circulation: Both V4 segments patent to the vertebrobasilar junction without stenosis. Both PICA origins patent and normal. Basilar patent to its distal aspect without stenosis. Superior cerebral arteries patent bilaterally. Both PCAs primarily supplied via  the basilar. Right PCA widely patent to its distal aspect. There is a focal severe stenosis with near occlusion of the left PCA at the proximal left P2 segment (series 12, image 127). Scant irregular thready flow seen within the left PCA distally. Venous sinuses: Patent allowing for timing the contrast bolus. Markedly hypoplastic right transverse sinus noted. Anatomic variants: None significant.  No aneurysm. Review of the MIP images confirms the above findings IMPRESSION: CT HEAD IMPRESSION: 1. Previously identified scattered ischemic infarcts are largely occult by CT. No intracranial hemorrhage or significant mass effect. 2. No other new acute intracranial abnormality. CTA HEAD AND NECK IMPRESSION: 1. Negative CTA for large vessel occlusion. 2. Focal severe near occlusive stenosis involving the proximal left P2 segment. Scant irregular thready flow within the left PCA distribution distally. 3. 40% atheromatous stenosis about the proximal left ICA. 4. Moderate approximate 50% stenoses at the origins of both vertebral arteries. Electronically Signed   By: Jeannine Boga M.D.   On: 11/21/2021 03:23   DG Lumbar Spine Complete  Result Date: 11/20/2021 CLINICAL DATA:  66 year old male with multiple recent falls. Left leg weakness. EXAM: LUMBAR SPINE - COMPLETE 4+ VIEW COMPARISON:  CT Abdomen and Pelvis 07/17/2021. FINDINGS: Transitional anatomy. Four lumbar type vertebrae. For the purposes of this report hypoplastic ribs are designated at T12 (full size ribs at T11) and sacralized L5 level. No pars fracture or spondylolisthesis. Straightening of lumbar lordosis appears stable. Multilevel degenerative lumbar endplate spurring but relatively preserved disc spaces. Sacrum and SI joints appear within normal limits. No acute osseous abnormality identified. Aortoiliac calcified atherosclerosis. Left hip arthroplasty. Bulky right nephrolithiasis redemonstrated. Negative visible bowel gas. IMPRESSION: 1. Transitional  anatomy. No acute osseous abnormality identified in the lumbar spine. 2. Chronic right nephrolithiasis. Electronically Signed   By: Genevie Ann M.D.   On: 11/20/2021 05:58   CT Head Wo Contrast  Result Date: 11/20/2021 CLINICAL DATA:  66 year old male with multiple recent falls. Left leg weakness. EXAM: CT HEAD WITHOUT CONTRAST TECHNIQUE: Contiguous axial images were obtained from the base of the skull through the vertex without intravenous contrast. COMPARISON:  None. FINDINGS: Brain: Cerebral volume is within normal limits for age. No midline shift, ventriculomegaly, mass effect, evidence of mass lesion, intracranial hemorrhage or evidence of cortically based acute infarction. Asymmetric nodular hypodensity in the ventral medial left thalamus. Elsewhere gray-white matter differentiation is within normal limits throughout the brain. Vascular: Mild Calcified atherosclerosis at the skull base. Faint basal ganglia vascular calcifications also. No suspicious intracranial vascular hyperdensity. Skull: Intact, negative. Sinuses/Orbits: Mild paranasal sinus mucosal thickening greater on the left. Tympanic cavities and mastoids are clear. Other: Visualized orbits and scalp soft tissues  are within normal limits. IMPRESSION: 1. 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 Right side symptoms would result. 2. Otherwise negative for age noncontrast CT appearance of the brain. 3. Minor paranasal sinus mucosal thickening. Electronically Signed   By: Genevie Ann M.D.   On: 11/20/2021 05:38   MR BRAIN WO CONTRAST  Result Date: 11/20/2021 CLINICAL DATA:  Neuro deficit, acute, stroke suspected EXAM: MRI HEAD WITHOUT CONTRAST TECHNIQUE: Multiplanar, multiecho pulse sequences of the brain and surrounding structures were obtained without intravenous contrast. COMPARISON:  CT head from the same day. FINDINGS: Brain: Many scattered acute or early subacute infarcts in bilateral frontal and parietal lobes, left  temporal and occipital lobes, left aspect of the splenium of the corpus callosum, and left cerebellum. The majority of these infarcts are small, although there are some areas of somewhat confluent infarct in the left parietal and occipital lobes and left aspect of the splenium corpus callosum. Associated edema without significant mass effect. No evidence of acute hemorrhage, hydrocephalus, midline shift, extra-axial fluid collection, or mass lesion. Vascular: Major arterial flow voids are maintained skull base. Skull and upper cervical spine: Normal marrow signal. Sinuses/Orbits: Mild paranasal sinus mucosal thickening. Unremarkable orbits. Other: Trace bilateral mastoid effusions.  The IMPRESSION: Many scattered acute or early subacute infarcts in bilateral frontal and parietal lobes, left temporal and occipital lobes, left aspect of the splenium of the corpus callosum, and left cerebellum. Given involvement of multiple vascular territories, consider a central embolic etiology. Associated edema without significant mass effect. Electronically Signed   By: Margaretha Sheffield M.D.   On: 11/20/2021 14:40   MR Lumbar Spine W Wo Contrast  Result Date: 11/20/2021 CLINICAL DATA:  Weakness in left leg, multiple falls in the past 2 weeks EXAM: MRI LUMBAR SPINE WITHOUT AND WITH CONTRAST TECHNIQUE: Multiplanar and multiecho pulse sequences of the lumbar spine were obtained without and with intravenous contrast. CONTRAST:  32mL GADAVIST GADOBUTROL 1 MMOL/ML IV SOLN COMPARISON:  Lumbar spine radiographs 11/20/2021 FINDINGS: Segmentation: Transitional anatomy is noted there is sacralization of the L5 vertebral body. For the purposes of this report, the lowest rudimentary disc space is designated L5-S1 which is in keeping with a lumbar spine radiographs from earlier the same day. Alignment:  Normal. Vertebrae: Vertebral body heights are preserved. There is mild degenerative marrow signal abnormality at L2-L3. Small Schmorl's  nodes are noted along the superior L1 and L2 endplates. There is no suspicious marrow signal abnormality. There is no abnormal marrow enhancement. Conus medullaris and cauda equina: Conus extends to the mid L1 level. Conus and cauda equina appear normal. There is no abnormal enhancement of the conus or cauda equina nerve roots. Paraspinal and other soft tissues: There is mild aneurysmal dilation of the infrarenal abdominal aorta measuring up to 3.2 cm, unchanged compared to the prior abdominal CT of 07/17/2021. Signal abnormality in the right kidney likely reflects the prominent renal stone as seen on prior CT. Disc levels: There is mild disc desiccation and narrowing at L4-L5. The other disc spaces are overall preserved. There is mild multilevel facet arthropathy, most advanced at L3-L4. Small facet joint effusions are noted bilaterally at L2-L3 and on the left at L3-L4 and L4-L5. T12-L1: No significant spinal canal or neural foraminal stenosis L1-L2: There is a mild disc bulge and mild bilateral facet arthropathy resulting in mild left and no significant right neural foraminal stenosis. L2-L3: There is a mild disc bulge and mild bilateral facet arthropathy resulting in mild bilateral neural  foraminal stenosis and mild crowding of the left subarticular zone without evidence of frank nerve root impingement. L3-L4: There is a diffuse disc bulge with bilateral foraminal components, ligamentum flavum thickening, and bilateral facet arthropathy resulting in mild spinal canal stenosis with crowding of the bilateral subarticular zones and possible mass effect on the traversing left L4 nerve root, and mild bilateral neural foraminal stenosis. L4-L5: There is a diffuse disc bulge with a superimposed central protrusion and mild bilateral facet arthropathy resulting in crowding of the bilateral subarticular zones with possible mass effect on the traversing left L5 nerve root and mild right and no significant left neural  foraminal stenosis. L5-S1: No significant spinal canal or neural foraminal stenosis. IMPRESSION: 1. Diffuse disc bulge with superimposed central protrusion at L4-L5 resulting in crowding of the subarticular zones with possible mass effect on the traversing left L5 nerve root. 2. Mild disc bulge at L3-L4 resulting in crowding of the subarticular zones with possible mass effect on the traversing left L4 nerve root. 3. Otherwise, no high-grade spinal canal or neural foraminal stenosis, and no other evidence of nerve root impingement. 4. Multilevel facet arthropathy, most advanced at L3-L4, with small facet joint effusions bilaterally at L2-L3 and on the left at L3-L4 and L4-L5. 5. Unchanged 3.2 cm infrarenal abdominal aortic aneurysm. Follow-up imaging is recommended every 3 years. Electronically Signed   By: Valetta Mole M.D.   On: 11/20/2021 15:07   DG CHEST PORT 1 VIEW  Result Date: 11/20/2021 CLINICAL DATA:  cough Per triage: Pt complains of increased weakness to his left leg. Pt states that he has had multiple falls in the past 2 weeks due to his left leg giving out. EXAM: PORTABLE CHEST 1 VIEW COMPARISON:  Chest x-ray 12/04/2020, CT chest 10/08/2021 FINDINGS: The heart and mediastinal contours are unchanged. Left chest wall to the pacemaker noted in similar position. No focal consolidation. No pulmonary edema. No pleural effusion. No pneumothorax. No acute osseous abnormality. IMPRESSION: No active disease. Electronically Signed   By: Iven Finn M.D.   On: 11/20/2021 18:32   ECHOCARDIOGRAM COMPLETE  Result Date: 11/20/2021    ECHOCARDIOGRAM REPORT   Patient Name:   Austin Oliver Date of Exam: 11/20/2021 Medical Rec #:  [id number]      Height:       68.5 in Accession #:    [id number]     Weight:       234.2 lb Date of Birth:  Dec 30, 1955      BSA:          2.198 m Patient Age:    58 years       BP:           110/55 mmHg Patient Gender: M              HR:           65 bpm. Exam Location:  Inpatient  Procedure: 2D Echo, Cardiac Doppler and Color Doppler Indications:    Stroke  History:        Patient has prior history of Echocardiogram examinations, most                 recent 10/02/2020. CAD; Risk Factors:Hypertension, Dyslipidemia                 and Former Smoker. Covid 19 positive.  Sonographer:    Merrie Roof RDCS Referring Phys: [id number] [place]  1. Left ventricular ejection fraction, by estimation, is 50  to 55%. The left ventricle has normal function. The left ventricle has no regional wall motion abnormalities. There is mild concentric left ventricular hypertrophy.  2. Right ventricular systolic function is normal. The right ventricular size is normal.  3. Left atrial size was mildly dilated.  4. The mitral valve is normal in structure. Trivial mitral valve regurgitation.  5. The aortic valve is tricuspid. There is mild calcification of the aortic valve. There is mild thickening of the aortic valve. Aortic valve regurgitation is not visualized. Aortic valve sclerosis/calcification is present, without any evidence of aortic stenosis.  6. The inferior vena cava is normal in size with greater than 50% respiratory variability, suggesting right atrial pressure of 3 mmHg. Comparison(s): Compared to prior TTE 09/2020, there is no significant change. Conclusion(s)/Recommendation(s): No intracardiac source of embolism detected on this transthoracic study. Consider a transesophageal echocardiogram to exclude cardiac source of embolism if clinically indicated. FINDINGS  Left Ventricle: Left ventricular ejection fraction, by estimation, is 50 to 55%. The left ventricle has normal function. The left ventricle has no regional wall motion abnormalities. The left ventricular internal cavity size was normal in size. There is  mild concentric left ventricular hypertrophy. Right Ventricle: The right ventricular size is normal. No increase in right ventricular wall thickness. Right ventricular systolic  function is normal. Left Atrium: Left atrial size was mildly dilated. Right Atrium: Right atrial size was normal in size. Pericardium: There is no evidence of pericardial effusion. Mitral Valve: The mitral valve is normal in structure. There is mild thickening of the mitral valve leaflet(s). There is mild calcification of the mitral valve leaflet(s). Mild mitral annular calcification. Trivial mitral valve regurgitation. Tricuspid Valve: The tricuspid valve is normal in structure. Tricuspid valve regurgitation is trivial. Aortic Valve: The aortic valve is tricuspid. There is mild calcification of the aortic valve. There is mild thickening of the aortic valve. Aortic valve regurgitation is not visualized. Aortic valve sclerosis/calcification is present, without any evidence of aortic stenosis. Pulmonic Valve: The pulmonic valve was normal in structure. Pulmonic valve regurgitation is trivial. Aorta: The aortic root is normal in size and structure. Venous: The inferior vena cava is normal in size with greater than 50% respiratory variability, suggesting right atrial pressure of 3 mmHg. IAS/Shunts: The atrial septum is grossly normal. Additional Comments: A device lead is visualized.  LEFT VENTRICLE PLAX 2D LVIDd:         4.10 cm LVIDs:         2.90 cm LV PW:         1.30 cm LV IVS:        1.10 cm  LEFT ATRIUM             Index        RIGHT ATRIUM           Index LA diam:        4.40 cm 2.00 cm/m   RA Area:     12.60 cm LA Vol (A2C):   49.9 ml 22.71 ml/m  RA Volume:   27.80 ml  12.65 ml/m LA Vol (A4C):   50.0 ml 22.75 ml/m LA Biplane Vol: 53.3 ml 24.25 ml/m   AORTA Ao Root diam: 3.30 cm Gwyndolyn Kaufman MD Electronically signed by Gwyndolyn Kaufman MD Signature Date/Time: 11/20/2021/6:07:26 PM    Final         Scheduled Meds:  aspirin EC  81 mg Oral Daily   clopidogrel  75 mg Oral Daily   enoxaparin (LOVENOX) injection  30 mg Subcutaneous Q24H   ezetimibe  10 mg Oral q AM   finasteride  5 mg Oral Daily    guaiFENesin  600 mg Oral BID   ipratropium-albuterol  3 mL Nebulization Q8H   levothyroxine  112 mcg Oral QAC breakfast   methylPREDNISolone (SOLU-MEDROL) injection  40 mg Intravenous Once   pantoprazole  40 mg Oral QPC supper   [START ON 11/22/2021] predniSONE  50 mg Oral Q breakfast   ranolazine  500 mg Oral BID   rosuvastatin  5 mg Oral Daily   senna  1 tablet Oral BID   tamsulosin  0.8 mg Oral Q supper   Continuous Infusions:   LOS: 1 day   Time spent: 71mins Greater than 50% of this time was spent in counseling, explanation of diagnosis, planning of further management, and coordination of care.   Voice Recognition Viviann Spare dictation system was used to create this note, attempts have been made to correct errors. Please contact the author with questions and/or clarifications.   Florencia Reasons, MD PhD FACP [facility]  Available via Epic secure chat 7am-7pm for nonurgent issues Please page for urgent issues To page the attending provider between 7A-7P or the covering provider during after hours 7P-7A, please log into the web site [email or web address] and access using universal [place] password for that web site. If you do not have the password, please call the hospital operator.    11/21/2021, 2:09 PM

## 2021-11-21 NOTE — ED Notes (Signed)
Pt c/o chest pain. RN gave 0.4mg  SL nitro. Pt states that pain has resolved. Pt resting comfortably in bed. Call light in reach.

## 2021-11-21 NOTE — Progress Notes (Signed)
VASCULAR LAB    Bilateral lower extremity venous duplex has been performed.  See CV proc for preliminary results.   Makaylyn Sinyard, RVT 11/21/2021, 2:08 PM

## 2021-11-22 ENCOUNTER — Ambulatory Visit: Payer: Medicare Other | Admitting: Internal Medicine

## 2021-11-22 LAB — CBC WITH DIFFERENTIAL/PLATELET
Abs Immature Granulocytes: 0.02 10*3/uL (ref 0.00–0.07)
Basophils Absolute: 0 10*3/uL (ref 0.0–0.1)
Basophils Relative: 0 %
Eosinophils Absolute: 0 10*3/uL (ref 0.0–0.5)
Eosinophils Relative: 0 %
HCT: 29.4 % — ABNORMAL LOW (ref 39.0–52.0)
Hemoglobin: 9.5 g/dL — ABNORMAL LOW (ref 13.0–17.0)
Immature Granulocytes: 1 %
Lymphocytes Relative: 19 %
Lymphs Abs: 0.6 10*3/uL — ABNORMAL LOW (ref 0.7–4.0)
MCH: 30.5 pg (ref 26.0–34.0)
MCHC: 32.3 g/dL (ref 30.0–36.0)
MCV: 94.5 fL (ref 80.0–100.0)
Monocytes Absolute: 0.1 10*3/uL (ref 0.1–1.0)
Monocytes Relative: 3 %
Neutro Abs: 2.5 10*3/uL (ref 1.7–7.7)
Neutrophils Relative %: 77 %
Platelets: 160 10*3/uL (ref 150–400)
RBC: 3.11 MIL/uL — ABNORMAL LOW (ref 4.22–5.81)
RDW: 13.7 % (ref 11.5–15.5)
WBC: 3.2 10*3/uL — ABNORMAL LOW (ref 4.0–10.5)
nRBC: 0 % (ref 0.0–0.2)

## 2021-11-22 LAB — BASIC METABOLIC PANEL
Anion gap: 9 (ref 5–15)
BUN: 15 mg/dL (ref 8–23)
CO2: 24 mmol/L (ref 22–32)
Calcium: 9.1 mg/dL (ref 8.9–10.3)
Chloride: 103 mmol/L (ref 98–111)
Creatinine, Ser: 1.14 mg/dL (ref 0.61–1.24)
GFR, Estimated: >60 mL/min (ref >60)
Glucose, Bld: 316 mg/dL — ABNORMAL HIGH (ref 70–99)
Potassium: 4.1 mmol/L (ref 3.5–5.1)
Sodium: 136 mmol/L (ref 135–145)

## 2021-11-22 LAB — GLUCOSE, CAPILLARY
Glucose-Capillary: 223 mg/dL — ABNORMAL HIGH (ref 70–99)
Glucose-Capillary: 342 mg/dL — ABNORMAL HIGH (ref 70–99)
Glucose-Capillary: 352 mg/dL — ABNORMAL HIGH (ref 70–99)
Glucose-Capillary: 258 mg/dL — ABNORMAL HIGH (ref 70–99)

## 2021-11-22 LAB — MAGNESIUM: Magnesium: 1.9 mg/dL (ref 1.7–2.4)

## 2021-11-22 MED ORDER — SENNOSIDES-DOCUSATE SODIUM 8.6-50 MG PO TABS
1.0000 | ORAL_TABLET | Freq: Two times a day (BID) | ORAL | Status: DC
  Administered 2021-11-22 – 2021-11-23 (×3): 1 via ORAL
  Filled 2021-11-22 (×3): qty 1

## 2021-11-22 MED ORDER — POLYETHYLENE GLYCOL 3350 17 G PO PACK
17.0000 g | PACK | Freq: Every day | ORAL | Status: DC
  Administered 2021-11-22 – 2021-11-23 (×2): 17 g via ORAL
  Filled 2021-11-22 (×2): qty 1

## 2021-11-22 MED ORDER — PREDNISONE 20 MG PO TABS
40.0000 mg | ORAL_TABLET | Freq: Every day | ORAL | Status: DC
  Administered 2021-11-23: 40 mg via ORAL
  Filled 2021-11-22: qty 2

## 2021-11-22 MED ORDER — INSULIN ASPART 100 UNIT/ML IJ SOLN
0.0000 [IU] | Freq: Three times a day (TID) | INTRAMUSCULAR | Status: DC
  Administered 2021-11-22: 15 [IU] via SUBCUTANEOUS
  Administered 2021-11-22: 11 [IU] via SUBCUTANEOUS
  Administered 2021-11-23: 8 [IU] via SUBCUTANEOUS

## 2021-11-22 MED ORDER — INSULIN ASPART 100 UNIT/ML IJ SOLN
4.0000 [IU] | Freq: Three times a day (TID) | INTRAMUSCULAR | Status: DC
  Administered 2021-11-22 – 2021-11-23 (×2): 4 [IU] via SUBCUTANEOUS

## 2021-11-22 NOTE — Progress Notes (Signed)
Nutrition Consult Diet Education  RD consulted for nutrition education regarding diabetes.  Spoke with patient and his son at bedside. Patient has had extensive diet education in the past with the RD at cardiac rehabilitation. He made a lot of dietary changes, such as decreasing portion sizes and eliminating sugary beverages. He states he has probably been eating out more than he should.   Lab Results  Component Value Date   HGBA1C 6.8 (H) 11/21/2021    RD provided "Carbohydrate Counting for People with Diabetes" handout from the Academy of Nutrition and Dietetics. Discussed different food groups and their effects on blood sugar, emphasizing carbohydrate-containing foods. Provided list of carbohydrates and recommended serving sizes of common foods.  Discussed importance of controlled and consistent carbohydrate intake throughout the day. Provided examples of ways to balance meals/snacks and encouraged intake of high-fiber, whole grain complex carbohydrates. Teach back method used.  Expect good compliance.  Body mass index is 33.37 kg/m. Pt meets criteria for obesity, class 1, based on current BMI.  Current diet order is heart healthy carbohydrate modified, patient is consuming approximately 100% of meals at this time. Labs and medications reviewed. No further nutrition interventions warranted at this time. RD contact information provided. If additional nutrition issues arise, please re-consult RD.  Lucas Mallow RD, LDN, CNSC Please refer to Amion for contact information.

## 2021-11-22 NOTE — Progress Notes (Signed)
STROKE TEAM PROGRESS NOTE   INTERVAL HISTORY His son is at the bedside. Patient is doing well and states he has no complaints today.  Lower extremity venous Dopplers is negative for DVT.  Physical therapy recommends outpatient therapies.  Neurological exam is unchanged.  Vital signs are stable. Vitals:   11/22/21 0311 11/22/21 0741 11/22/21 1142 11/22/21 1600  BP: 112/60 106/63 133/65 122/72  Pulse: 60 66 64 62  Resp: 18 17 16 17   Temp: 98.3 F (36.8 C) (!) 97.5 F (36.4 C) 97.9 F (36.6 C) 97.7 F (36.5 C)  TempSrc: Oral Oral Oral Oral  SpO2: 96% 98% 99% 96%  Weight:      Height:       CBC:  Recent Labs  Lab 11/19/21 2038 11/21/21 0328 11/22/21 0118  WBC 4.2 2.7* 3.2*  NEUTROABS 2.5  --  2.5  HGB 9.9* 9.0* 9.5*  HCT 31.0* 28.8* 29.4*  MCV 96.6 97.6 94.5  PLT 184 147* 878   Basic Metabolic Panel:  Recent Labs  Lab 11/21/21 0328 11/22/21 0118  NA 137 136  K 3.9 4.1  CL 106 103  CO2 24 24  GLUCOSE 167* 316*  BUN 15 15  CREATININE 1.11 1.14  CALCIUM 8.7* 9.1  MG  --  1.9   Lipid Panel:  Recent Labs  Lab 11/21/21 1151  CHOL 66  TRIG 146  HDL 12*  CHOLHDL 5.5  VLDL 29  LDLCALC 25   HgbA1c:  Recent Labs  Lab 11/21/21 0328  HGBA1C 6.8*   Urine Drug Screen: No results for input(s): LABOPIA, COCAINSCRNUR, LABBENZ, AMPHETMU, THCU, LABBARB in the last 168 hours.  Alcohol Level No results for input(s): ETH in the last 168 hours.  IMAGING past 24 hours No results found.  PHYSICAL EXAM  Physical Exam  Constitutional: Appears obese middle-aged Caucasian male Psych: Affect appropriate to situation Cardiovascular: Normal rate and regular rhythm.  Respiratory: Effort normal, non-labored breathing  Neuro: Mental Status: Patient is awake, alert, oriented to person, place, month, year, and situation. Patient is able to give a clear and coherent history. No signs of aphasia or neglect Cranial Nerves: II: Visual Fields are full. Pupils are equal, round,  and reactive to light.   III,IV, VI: EOMI without ptosis or diploplia.  V: Facial sensation is symmetric to temperature VII: Facial movement is symmetric resting and smiling VIII: Hearing is intact to voice X: Palate elevates symmetrically XI: Shoulder shrug is symmetric. XII: Tongue protrudes midline without atrophy or fasciculations.  Motor: 5/5 strength present in bilateral upper extremities and right lower extremity.  There is weakness in left lower extremity. Patient is able to elevate the left lower extremity briefly.  Sensory: Sensation is symmetric to light touch and temperature in the arms and legs. No extinction to DSS present.  Coordination: FNF and HKS are intact bilaterally Gait:  Deferred, multiple recent falls  ASSESSMENT/PLAN [name] is a 66 y.o. male with a medical history significant for sick sinus syndrome s/p AICD, COPD, cirrhosis of the liver, coronary artery calcification, essential hypertension, hyperlipidemia, obstructive sleep apnea on CPAP, and osteoarthritis who initially presented to [facility] on 1/4 for evaluation of left lower extremity weakness since Thursday, December 29. Patient recently had a right knee replacement on 12/15 with spinal anesthesia and has been in physical therapy following his procedure. He has been walking with a walker at home since his knee replacement. He noted that at his last PT appointment, he felt weak on the left  leg. Over the past 2 days patient states that he has been dragging his left leg and has had two falls due to his weakness prompting him to be evaluated. Patient was also found recently to be COVID positive and states that since he started taking his medication, for the first 1-2 days, when he woke up he felt disoriented and confused and that this took some time to clear. He denies current confusion or disorientation. Patient's only other complaint is issues with initiating his urine flow since his surgery. Patient was  subsequently transferred to [facility] ED to undergo MRI imaging with AICD revealing multiple acute/subacute infarcts in bilateral hemispheres concerning for embolic etiology. [employer] rep contacted to interrogate pacemaker. Tentative plan for TEE midweek due to covid pos. Test.   Stroke:  scattered acute and subacute infarcts in bilateral frontal, parietal, temporal, and occipital lobes infarct -etiology indeterminate cryptogenic versus recent acute COVID infection Code Stroke: small vessel disease   CTA head & neck - focal area of narrowing of the P2 segment MRI  Many scattered acute or early subacute infarcts in bilateral frontal and parietal lobes, left temporal and occipital lobes, left aspect of the splenium of the corpus callosum, and left cerebellum 2D Echo EF 50-55% Venous duplex lower extremity negative for DVT LDL 106 HgbA1c 6.8 VTE prophylaxis - Lovenox aspirin 81 mg daily prior to admission, now on aspirin 81 mg daily and clopidogrel 75 mg daily for 3 weeks and then plavix alone Therapy recommendations: Outpatient PT OT disposition:  pending  Hypertension Home meds:  None Stable Permissive hypertension (OK if < 220/120) but gradually normalize in 5-7 days Long-term BP goal normotensive  Hyperlipidemia Home meds:  zetia, , crestor 5mg  resumed in hospital LDL 106, goal < 70 Consider increasing crestor, patient does have a statin allergy Continue statin at discharge  Diabetes Mellitus Type 2, new diagnosis, patient and son are made aware of the diagnosis  No home medication A1c 6.8 Carb modified diet for now SSI CBG Diabetes /diet education   Other Stroke Risk Factors Advanced Age >/= 42  Hx stroke/TIA Coronary artery disease Imdur Pacemaker for sick sinus syndrome and bradycardia [employer] to interrogate pacemaker OSA- resume cpap use Has not used cpap since knee surgery  Other Active Problems Back Pain- per primar MRI spine showed diffuse disc bulge, no high grade  spinal canal or neural foraminal stenosis , no evidence of nerve root impingement, detail please refer to original MRI spine Multiple Falls PT/OT evaluation COPD with mild copd exacerbation- manage per primary   Has bilateral wheezing, no acute infiltrate on CXR Quit smoking in 2018 Nebs, mucinex, steroids Covid 19 Virus - per primary Afebrile Finished oral treatment Some productive cough Some wheezing, no hypoxia, denies sob, cxr no acute findings,on  mucinex, nebs Normocytic anemia- per primary Hgb 9.9 History of B12 deficiency- continue B12 Continue B12 Right knee replacement on 12/15 Venous duplex ordered BPH- per primary Proscar and flomax   Hospital day # 2  Patient presented with left leg weakness and fall following recent knee surgery and COVID infection and MRI scan shows multiple bilateral Bolick infarcts of indeterminate etiology.  Recommend further evaluation by checking lower extremity venous Dopplers for DVT and may need loop recorder at discharge for paroxysmal A. fib.  Continue cardiac monitoring and treatment for COVID as per medical hospitalist.  Continue aspirin Plavix for 3 weeks followed by Plavix alone and aggressive risk factor modification.  Stroke team will sign off.  Kindly  call for questions.  Follow-up as an outpatient stroke clinic in 2 months greater than 50% time during this prolonged 35-minute visit was spent in counseling and coordination of care discussion about his stroke and stroke evaluation .Discussed with Dr. Larey Days, [place] Pager: [phone] 11/22/2021 5:41 PM   To contact Stroke Continuity provider, please refer to [email or web address] After hours, contact General Neurology

## 2021-11-22 NOTE — Progress Notes (Signed)
PROGRESS NOTE    Austin Oliver  IRJ:[id number] DOB: 06-09-1956 DOA: 11/19/2021 PCP: [name], MD    Chief Complaint  Patient presents with   Weakness   Extremity Weakness    Brief Narrative:  Austin Oliver is a 66 y.o. 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 TTE did not reveal cardioembolic source  Pacemaker interrogation per cardiology On asa, plavix, zetia ,crestor at home, continue Hold bp meds to allow permission hypertension  Neurology consulted recommended loop recorder, Continue aspirin Plavix for 3 weeks followed by Plavix alone and aggressive risk factor modification.   Follow-up as an outpatient stroke clinic in 2 months   Diabetes, new diagnosis,   Present with hyperglycemia A1c 6.8 Carb modified diet Add meal coverage to Sliding scale insulin  He will likely be discharged on oral meds for diabetes Diabetes /diet education    Low back pain MRI spine showed diffuse disc bulge, no high grade spinal canal or neural foraminal stenosis , no evidence of nerve root impingement, detail please refer to  original MRI spine Case discussed with neuro spine surgeon Dr. Ellene Route who recommend conservative management and follow-up outpatient as needed Analgesic and physical therapy    CAD s/p DES to LAD in 08/2020 Has occasional chest pain respond to nitroglycerin, he is made aware to contact his cardiologist should he has increasing chest pain  Continue home medication   SSS s/p PPM   HTN Allow permissive hypertension   COPD with mild copd exacerbation,  Quit smoking in 2018  cxr no acute infiltrate   bilateral wheezing has much improved on nebs, mucinex,steroids  Taper steroids   Covid 19 Finished oral treatment  no hypoxia, denies sob, cxr no acute findings  improving    Normocytic anemia Suspect had blood loss anemia from recent surgery, as hemoglobin.  On 12/16 was 9.1, hemoglobin today is 9.9 History of B12 deficiency No overt sign of external bleed Continue B12 iron level in 09/2021 was unremarkable FOBT ordered, pending collection    Hypothyroidism, continue Synthroid   BPH  Continue on proscar and flomax Reports having more trouble initating and straining bladder scan ordered  F/u with Dr Gloriann Loan   OSA, continue CPAP Reports has not use cpap since after knee surgery  Unchanged 3.2 cm infrarenal abdominal aortic aneurysm. Follow-up imaging is recommended every 3 years.   S/p right knee replacement on 12/15 Right lower leg edematous, venous doppler negative for DVT    Falls, fall prevention,  Improved, PT eval recommended outpatient PT    .    Unresulted Labs (From admission, onward)  Start     Ordered   11/27/21 0500  Creatinine, serum  (enoxaparin (LOVENOX)    CrCl < 30 ml/min)  Weekly,   R     Comments: while on enoxaparin therapy.    11/20/21 2246   11/23/21 4235  Basic metabolic panel  Tomorrow morning,   R        11/22/21 2037   Unscheduled  Occult blood card to lab, stool  As needed,   R      11/22/21 2033              DVT  prophylaxis: enoxaparin (LOVENOX) injection 30 mg Start: 11/21/21 0800   Code Status:full Family Communication: son at bedside  Disposition:   Status is: Inpatient  Dispo: The patient is from: home              Anticipated d/c is to: home               Anticipated d/c date is: tomorrow, need to get in touch with cardiology /EP regarding loop recorder                Consultants:  neurology  Procedures:  none  Antimicrobials:   Anti-infectives (From admission, onward)    None           Objective: Vitals:   11/22/21 0741 11/22/21 1142 11/22/21 1600 11/22/21 2016  BP: 106/63 133/65 122/72 136/80  Pulse: 66 64 62 62  Resp: 17 16 17    Temp: (!) 97.5 F (36.4 C) 97.9 F (36.6 C) 97.7 F (36.5 C) 97.7 F (36.5 C)  TempSrc: Oral Oral Oral Oral  SpO2: 98% 99% 96% 97%  Weight:      Height:        Intake/Output Summary (Last 24 hours) at 11/22/2021 2038 Last data filed at 11/22/2021 3614 Gross per 24 hour  Intake --  Output 600 ml  Net -600 ml   Filed Weights   11/21/21 2019  Weight: 102.5 kg    Examination:  General exam: alert, awake, communicative,calm, NAD Respiratory system: + bilateral wheezing. Respiratory effort normal. Cardiovascular system:  RRR.  Gastrointestinal system: Abdomen is nondistended, soft and nontender.  Normal bowel sounds heard. Central nervous system: Alert and orientedx3, left leg weakness Extremities:  right knee post op changes, right lower extremity edema, left leg weakness, not able to lift against resistance  Skin: No rashes, lesions or ulcers Psychiatry: Judgement and insight appear normal. Mood & affect appropriate.     Data Reviewed: I have personally reviewed following labs and imaging studies  CBC: Recent Labs  Lab 11/19/21 2038 11/21/21 0328 11/22/21 0118  WBC 4.2 2.7* 3.2*  NEUTROABS 2.5  --  2.5  HGB 9.9* 9.0* 9.5*  HCT 31.0* 28.8* 29.4*  MCV 96.6 97.6 94.5  PLT 184 147* 431    Basic Metabolic  Panel: Recent Labs  Lab 11/19/21 2038 11/21/21 0328 11/22/21 0118  NA 135 137 136  K 4.4 3.9 4.1  CL 103 106 103  CO2 26 24 24   GLUCOSE 233* 167* 316*  BUN 17 15 15   CREATININE 1.12 1.11 1.14  CALCIUM 9.0 8.7* 9.1  MG  --   --  1.9    GFR: Estimated Creatinine Clearance: 75.2 mL/min (by C-G formula based on SCr of 1.14 mg/dL).  Liver Function Tests: Recent Labs  Lab 11/19/21 2038 11/21/21 0328  AST 15 14*  ALT 11 10  ALKPHOS 68 62  BILITOT 1.6* 1.7*  PROT  7.2 6.1*  ALBUMIN 3.8 3.2*    CBG: Recent Labs  Lab 11/22/21 0743 11/22/21 1146 11/22/21 1604 11/22/21 2020  GLUCAP 223* 342* 352* 258*     Recent Results (from the past 240 hour(s))  Resp Panel by RT-PCR (Flu A&B, Covid) Nasopharyngeal Swab     Status: Abnormal   Collection Time: 11/20/21  2:53 PM   Specimen: Nasopharyngeal Swab; Nasopharyngeal(NP) swabs in vial transport medium  Result Value Ref Range Status   SARS Coronavirus 2 by RT PCR POSITIVE (A) NEGATIVE Final    Comment: (NOTE) SARS-CoV-2 target nucleic acids are DETECTED.  The SARS-CoV-2 RNA is generally detectable in upper respiratory specimens during the acute phase of infection. Positive results are indicative of the presence of the identified virus, but do not rule out bacterial infection or co-infection with other pathogens not detected by the test. Clinical correlation with patient history and other diagnostic information is necessary to determine patient infection status. The expected result is Negative.  Fact Sheet for Patients: EntrepreneurPulse.com.au  Fact Sheet for Healthcare Providers: IncredibleEmployment.be  This test is not yet approved or cleared by the [place] FDA and  has been authorized for detection and/or diagnosis of SARS-CoV-2 by FDA under an Emergency Use Authorization (EUA).  This EUA will remain in effect (meaning this test can be used) for the duration of  the  COVID-19 declaration under Section 564(b)(1) of the A ct, 21 U.S.C. section 360bbb-3(b)(1), unless the authorization is terminated or revoked sooner.     Influenza A by PCR NEGATIVE NEGATIVE Final   Influenza B by PCR NEGATIVE NEGATIVE Final    Comment: (NOTE) The Xpert Xpress SARS-CoV-2/FLU/RSV plus assay is intended as an aid in the diagnosis of influenza from Nasopharyngeal swab specimens and should not be used as a sole basis for treatment. Nasal washings and aspirates are unacceptable for Xpert Xpress SARS-CoV-2/FLU/RSV testing.  Fact Sheet for Patients: EntrepreneurPulse.com.au  Fact Sheet for Healthcare Providers: IncredibleEmployment.be  This test is not yet approved or cleared by the [place] FDA and has been authorized for detection and/or diagnosis of SARS-CoV-2 by FDA under an Emergency Use Authorization (EUA). This EUA will remain in effect (meaning this test can be used) for the duration of the COVID-19 declaration under Section 564(b)(1) of the Act, 21 U.S.C. section 360bbb-3(b)(1), unless the authorization is terminated or revoked.  Performed at [facility], [place] [address], [place], [place] [address]          Radiology Studies: VAS [place] LOWER EXTREMITY VENOUS (DVT)  Result Date: 11/21/2021  Lower Venous DVT Study Patient Name:  IMRE VECCHIONE  Date of Exam:   11/21/2021 Medical Rec #: [id number]       Accession #:    [id number] Date of Birth: 1956-05-05       Patient Gender: M Patient Age:   8 years Exam Location:  [facility] Procedure:      VAS [place] LOWER EXTREMITY VENOUS (DVT) Referring Phys: Annamaria Boots Nolton Denis --------------------------------------------------------------------------------  Indications: Stroke, and Covid-19.  Comparison Study: No prior study Performing Technologist: Sharion Dove RVS  Examination Guidelines: A complete evaluation includes B-mode imaging, spectral Doppler, color Doppler, and power Doppler  as needed of all accessible portions of each vessel. Bilateral testing is considered an integral part of a complete examination. Limited examinations for reoccurring indications may be performed as noted. The reflux portion of the exam is performed with the patient in reverse Trendelenburg.  +---------+---------------+---------+-----------+----------+--------------+  RIGHT  Compressibility Phasicity Spontaneity Properties Thrombus Aging  +---------+---------------+---------+-----------+----------+--------------+  CFV       Full            Yes       Yes                                    +---------+---------------+---------+-----------+----------+--------------+  SFJ       Full                                                             +---------+---------------+---------+-----------+----------+--------------+  FV Prox   Full                                                             +---------+---------------+---------+-----------+----------+--------------+  FV Mid    Full                                                             +---------+---------------+---------+-----------+----------+--------------+  FV Distal Full                                                             +---------+---------------+---------+-----------+----------+--------------+  PFV       Full                                                             +---------+---------------+---------+-----------+----------+--------------+  POP       Full            Yes       Yes                                    +---------+---------------+---------+-----------+----------+--------------+  PTV       Full                                                             +---------+---------------+---------+-----------+----------+--------------+  PERO      Full                                                             +---------+---------------+---------+-----------+----------+--------------+    +---------+---------------+---------+-----------+----------+--------------+  LEFT      Compressibility Phasicity Spontaneity Properties Thrombus Aging  +---------+---------------+---------+-----------+----------+--------------+  CFV       Full            Yes       Yes                                    +---------+---------------+---------+-----------+----------+--------------+  SFJ       Full                                                             +---------+---------------+---------+-----------+----------+--------------+  FV Prox   Full                                                             +---------+---------------+---------+-----------+----------+--------------+  FV Mid    Full                                                             +---------+---------------+---------+-----------+----------+--------------+  FV Distal Full                                                             +---------+---------------+---------+-----------+----------+--------------+  PFV       Full                                                             +---------+---------------+---------+-----------+----------+--------------+  POP       Full            Yes       Yes                                    +---------+---------------+---------+-----------+----------+--------------+  PTV       Full                                                             +---------+---------------+---------+-----------+----------+--------------+  PERO      Full                                                             +---------+---------------+---------+-----------+----------+--------------+  Summary: BILATERAL: - No evidence of deep vein thrombosis seen in the lower extremities, bilaterally. -No evidence of popliteal cyst, bilaterally.   *See table(s) above for measurements and observations. Electronically signed by Jamelle Haring on 11/21/2021 at 4:59:02 PM.    Final         Scheduled Meds:  aspirin EC  81 mg Oral Daily    clopidogrel  75 mg Oral Daily   enoxaparin (LOVENOX) injection  30 mg Subcutaneous Q24H   ezetimibe  10 mg Oral q AM   finasteride  5 mg Oral Daily   guaiFENesin  600 mg Oral BID   insulin aspart  0-15 Units Subcutaneous TID WC   insulin aspart  4 Units Subcutaneous TID WC   Ipratropium-Albuterol  1 puff Inhalation Q6H   levothyroxine  112 mcg Oral QAC breakfast   living well with diabetes book   Does not apply Once   pantoprazole  40 mg Oral QPC supper   polyethylene glycol  17 g Oral Daily   [START ON 11/23/2021] predniSONE  40 mg Oral Q breakfast   ranolazine  500 mg Oral BID   rosuvastatin  5 mg Oral Daily   senna-docusate  1 tablet Oral BID   tamsulosin  0.8 mg Oral Q supper   Continuous Infusions:   LOS: 2 days   Time spent: 9mins Greater than 50% of this time was spent in counseling, explanation of diagnosis, planning of further management, and coordination of care.   Voice Recognition Viviann Spare dictation system was used to create this note, attempts have been made to correct errors. Please contact the author with questions and/or clarifications.   Florencia Reasons, MD PhD FACP [facility]  Available via Epic secure chat 7am-7pm for nonurgent issues Please page for urgent issues To page the attending provider between 7A-7P or the covering provider during after hours 7P-7A, please log into the web site [email or web address] and access using universal [place] password for that web site. If you do not have the password, please call the hospital operator.    11/22/2021, 8:38 PM

## 2021-11-22 NOTE — Progress Notes (Signed)
Inpatient Diabetes Program Recommendations  AACE/ADA: New Consensus Statement on Inpatient Glycemic Control (2015)  Target Ranges:  Prepandial:   less than 140 mg/dL      Peak postprandial:   less than 180 mg/dL (1-2 hours)      Critically ill patients:  140 - 180 mg/dL   Lab Results  Component Value Date   GLUCAP 342 (H) 11/22/2021   HGBA1C 6.8 (H) 11/21/2021    Review of Glycemic Control  Latest Reference Range & Units 11/22/21 07:43 11/22/21 11:46  Glucose-Capillary 70 - 99 mg/dL 223 (H) 342 (H)  (H): Data is abnormally high Diabetes history: New onset DM Outpatient Diabetes medications: none Current orders for Inpatient glycemic control: Novolog 0-15 units TID Prednisone 50 mg QA  Inpatient Diabetes Program Recommendations:    In the setting of steroids, consider adding Novolog 5 units TID (assuming patient is consuming >50% of meals).  Of note, question if A1C is falsely low given Hgb.  Spoke with patient regarding new onset DM.  Reviewed patient's current A1c of 6.8%. Explained what a A1c is and what it measures. Also reviewed goal A1c with patient, importance of good glucose control @ home, and blood sugar goals. Reviewed patho of DM, role of pancreas, survival skills, interventions, when to call MD, frequency of checking CBGs, vascular changes and commorbidities. Patient will need a meter at discharge. Blood glucose meter kit #[id number]. Reviewed frequency of checking CBGS and stressed importance of following up with PCP.  Patient admits to drinking sodas, especially since testing positive for covid. Reviewed alternatives, how to read nutritional labels, importance of being mindful of CHO intake, and ADA recommendations on activity levels. Patient has no further questions at this time.   Thanks, Bronson Curb, MSN, RNC-OB Diabetes Coordinator [phone] (8a-5p)

## 2021-11-22 NOTE — Evaluation (Signed)
Physical Therapy Evaluation Patient Details Name: Austin Oliver MRN: [id number] DOB: 07/10/1956 Today's Date: 11/22/2021  History of Present Illness  66 y.o. male presents to [facility] hospital on 11/20/2021 with LLE weakness, multiple falls, and recent COVID diagnosis. Pt found to have multiple scattered infarcts in bilateral hemispheres. PMH includes hypothyroidism, HLD, HTN, CAD, SSS s/p pacemaker in 11/2020, COPD, OSA on cpap.  Clinical Impression  Pt presents to PT with deficits in strength, power, ROM, gait, balance, sensation, and mobility. Pt with L sided weakness in addition to weakness and ROM deficits from recent R TKA. Pt demonstrates the ability to transfer and ambulate for household distances without physical assistance. PT provides reinforcement of LE HEP and encourages L hip flexion and L ankle dorsiflexion exercise to improve foot clearance. Pt will benefit from continued acute PT services and outpatient PT at the time of discharge to aide in a return to independence.        Recommendations for follow up therapy are one component of a multi-disciplinary discharge planning process, led by the attending physician.  Recommendations may be updated based on patient status, additional functional criteria and insurance authorization.  Follow Up Recommendations Outpatient PT    Assistance Recommended at Discharge Intermittent Supervision/Assistance  Patient can return home with the following  Help with stairs or ramp for entrance;Assist for transportation    Equipment Recommendations None recommended by PT (pt owns necessary DME)  Recommendations for Other Services       Functional Status Assessment Patient has had a recent decline in their functional status and demonstrates the ability to make significant improvements in function in a reasonable and predictable amount of time.     Precautions / Restrictions Precautions Precautions: Fall;Knee Precaution Booklet Issued:  No Restrictions Weight Bearing Restrictions: No RLE Weight Bearing: Weight bearing as tolerated      Mobility  Bed Mobility                    Transfers Overall transfer level: Needs assistance Equipment used: Rolling walker (2 wheels) Transfers: Sit to/from Stand Sit to Stand: Supervision           General transfer comment: pt stands from bed, commode x2, and recliner    Ambulation/Gait Ambulation/Gait assistance: Supervision Gait Distance (Feet): 120 Feet (additional trials of 15' and 25' x 2) Assistive device: Rolling walker (2 wheels) Gait Pattern/deviations: Step-through pattern Gait velocity: reduced Gait velocity interpretation: 1.31 - 2.62 ft/sec, indicative of limited community ambulator   General Gait Details: pt with steady step-through gait, mild increase in trunk flexion over RW  Stairs            Wheelchair Mobility    Modified Rankin (Stroke Patients Only)       Balance Overall balance assessment: Needs assistance Sitting-balance support: No upper extremity supported;Feet supported Sitting balance-Leahy Scale: Good     Standing balance support: No upper extremity supported Standing balance-Leahy Scale: Fair Standing balance comment: washes hands at sink without UE support                             Pertinent Vitals/Pain Pain Assessment: Faces Faces Pain Scale: Hurts little more Pain Location: R knee Pain Descriptors / Indicators: Grimacing Pain Intervention(s): Monitored during session    Home Living Family/patient expects to be discharged to:: Private residence Living Arrangements: Spouse/significant other Available Help at Discharge: Family Type of Home: House Home Access: Stairs to enter Entrance  Stairs-Rails: None Entrance Stairs-Number of Steps: 1   Home Layout: Multi-level;Able to live on main level with bedroom/bathroom Home Equipment: Rolling Walker (2 wheels);BSC/3in1;Cane - single point       Prior Function Prior Level of Function : Independent/Modified Independent             Mobility Comments: has been ambulating with RW since R TKA       Hand Dominance   Dominant Hand: Right    Extremity/Trunk Assessment   Upper Extremity Assessment Upper Extremity Assessment: Overall WFL for tasks assessed    Lower Extremity Assessment Lower Extremity Assessment: RLE deficits/detail;LLE deficits/detail RLE Deficits / Details: grossly 4-/5, ROM not formally assessed however knee extension is functional and flexion to ~90 degrees observed during mobility LLE Deficits / Details: 3+/5 DF, 4/5 PF, 4/5 knee extension/flexion, 3-/5 hip flexion LLE Sensation: decreased light touch    Cervical / Trunk Assessment Cervical / Trunk Assessment: Normal  Communication   Communication: No difficulties  Cognition Arousal/Alertness: Awake/alert Behavior During Therapy: WFL for tasks assessed/performed Overall Cognitive Status: Within Functional Limits for tasks assessed                                          General Comments General comments (skin integrity, edema, etc.): VSS on RA    Exercises Total Joint Exercises Ankle Circles/Pumps: AROM;Both;10 reps General Exercises - Lower Extremity Long Arc Quad: AROM;Both;5 reps Hip Flexion/Marching: AROM;Left;5 reps   Assessment/Plan    PT Assessment Patient needs continued PT services  PT Problem List Decreased strength;Decreased activity tolerance;Decreased balance;Decreased mobility;Decreased range of motion;Decreased knowledge of use of DME;Impaired sensation;Pain       PT Treatment Interventions DME instruction;Gait training;Stair training;Functional mobility training;Therapeutic activities;Therapeutic exercise;Balance training;Neuromuscular re-education;Patient/family education    PT Goals (Current goals can be found in the Care Plan section)  Acute Rehab PT Goals Patient Stated Goal: to return to  independence PT Goal Formulation: With patient Time For Goal Achievement: 12/06/21 Potential to Achieve Goals: Good    Frequency Min 4X/week     Co-evaluation               AM-PAC PT "6 Clicks" Mobility  Outcome Measure Help needed turning from your back to your side while in a flat bed without using bedrails?: None Help needed moving from lying on your back to sitting on the side of a flat bed without using bedrails?: None Help needed moving to and from a bed to a chair (including a wheelchair)?: A Little Help needed standing up from a chair using your arms (e.g., wheelchair or bedside chair)?: A Little Help needed to walk in hospital room?: A Little Help needed climbing 3-5 steps with a railing? : A Little 6 Click Score: 20    End of Session   Activity Tolerance: Patient tolerated treatment well Patient left: in chair;with call bell/phone within reach;with family/visitor present (RN ok with leaving chair alarm off for son to assist pt to/from bathroom) Nurse Communication: Mobility status PT Visit Diagnosis: Other abnormalities of gait and mobility (R26.89);Muscle weakness (generalized) (M62.81);Other symptoms and signs involving the nervous system (R29.898)    Time: 8413-2440 PT Time Calculation (min) (ACUTE ONLY): 51 min   Charges:   PT Evaluation $PT Eval Low Complexity: 1 Low PT Treatments $Gait Training: 8-22 mins $Therapeutic Activity: 8-22 mins        Zenaida Niece, PT,  DPT Acute Rehabilitation Pager: [phone] Office [place] 11/22/2021, 10:59 AM

## 2021-11-22 NOTE — Progress Notes (Signed)
°  Transition of Care [facility]) Screening Note   Patient Details  Name: Austin Oliver Date of Birth: [birth year]   Transition of Care [facility]) CM/SW Contact:    Benard Halsted, LCSW Phone Number: 11/22/2021, 9:59 AM    Transition of Care Department [facility]) has reviewed patient and no TOC needs have been identified at this time. We will continue to monitor patient advancement through interdisciplinary progression rounds. If new patient transition needs arise, please place a TOC consult.

## 2021-11-23 ENCOUNTER — Inpatient Hospital Stay (HOSPITAL_COMMUNITY): Payer: Medicare Other

## 2021-11-23 LAB — BASIC METABOLIC PANEL
Anion gap: 6 (ref 5–15)
BUN: 21 mg/dL (ref 8–23)
CO2: 26 mmol/L (ref 22–32)
Calcium: 9.3 mg/dL (ref 8.9–10.3)
Chloride: 102 mmol/L (ref 98–111)
Creatinine, Ser: 1.21 mg/dL (ref 0.61–1.24)
GFR, Estimated: >60 mL/min (ref >60)
Glucose, Bld: 198 mg/dL — ABNORMAL HIGH (ref 70–99)
Potassium: 4.2 mmol/L (ref 3.5–5.1)
Sodium: 134 mmol/L — ABNORMAL LOW (ref 135–145)

## 2021-11-23 LAB — GLUCOSE, CAPILLARY: Glucose-Capillary: 258 mg/dL — ABNORMAL HIGH (ref 70–99)

## 2021-11-23 MED ORDER — ASPIRIN 81 MG PO CHEW: 81.0000 mg | CHEWABLE_TABLET | Freq: Every day | ORAL | 0 refills | Status: AC

## 2021-11-23 MED ORDER — METFORMIN HCL 500 MG PO TABS: 500.0000 mg | ORAL_TABLET | Freq: Two times a day (BID) | ORAL | 0 refills | Status: DC

## 2021-11-23 MED ORDER — ISOSORBIDE MONONITRATE ER 120 MG PO TB24: 120.0000 mg | ORAL_TABLET | Freq: Every day | ORAL | 2 refills | Status: DC

## 2021-11-23 MED ORDER — CLOPIDOGREL BISULFATE 75 MG PO TABS: 75.0000 mg | ORAL_TABLET | Freq: Every day | ORAL | 2 refills | Status: DC

## 2021-11-23 MED ORDER — METFORMIN HCL 500 MG PO TABS: 500.0000 mg | ORAL_TABLET | Freq: Two times a day (BID) | ORAL | Status: DC

## 2021-11-23 MED ORDER — BLOOD GLUCOSE MONITOR KIT: PACK | 0 refills | Status: AC

## 2021-11-23 MED ORDER — PREDNISONE 10 MG PO TABS: ORAL_TABLET | ORAL | 0 refills | Status: DC

## 2021-11-23 NOTE — Progress Notes (Signed)
RN and patient family at bedside. Patient alert and free of pain. Patient educated on discharge process per MD order. Patient to receive walker before actual discharge. All questions by patient + patient family answered by RN. Volunteer services to transport patient off unit. Agricultural consultant notified. Telemonitoring connections disconnected; IV access removed.

## 2021-11-23 NOTE — Plan of Care (Signed)

## 2021-11-23 NOTE — Progress Notes (Signed)
Pt found on floor by NT during rounds. Pt stated he was walking to the bathroom with his walker and the wheels slipped to the side and he fell on his left knee. Pt denies hitting head, pain or injury. No changes noted to pt assessment. Pt A/Ox4. VSS. Pt reminded to use the call bell for assistance with ambulation. Bed alarm activated. MD notified.

## 2021-11-23 NOTE — Care Management (Signed)
RW to be delivered to room

## 2021-11-23 NOTE — Discharge Summary (Signed)
Discharge Summary  Austin Oliver JZP:[id number] DOB: 13-May-1956  PCP: [name], MD  Admit date: 11/19/2021 Discharge date: 11/23/2021  Time spent: 70mns, more than 50% time spent on coordination of care.   Recommendations for Outpatient Follow-up:  F/u with PCP within a week  for hospital discharge follow up, repeat cbc/bmp at follow up.  PCP to follow-up on anemia and diabetes management Follow-up with neurology/stroke clinic with Austin. SLeonie ManFollow-up with EP for pacemaker interrogation Follow-up with cardiology for CAD Follow-up with pulmonology for COPD management Outpatient PT referral made Outpatient diabetes education referral made  Patient is advised to do home bp monitoring, his sbp is around 110's in the hospital without imdur, he is advised to continue hold for three days, and hold if sbp less than 100, further imdur dose adjustment per pcp and cardiology     Discharge Diagnoses:  Active Hospital Problems   Diagnosis Date Noted   [place] [place]   OSA on CPAP 08/26/2021   Coronary artery disease involving native coronary artery of native heart without angina pectoris 09/11/2020    Resolved Hospital Problems  No resolved problems to display.    Discharge Condition: stable  Diet recommendation: heart healthy/carb modified  Filed Weights   11/21/21 2019  Weight: 102.5 kg    History of present illness:  Chief Complaint: left leg weakness and falls   HPI: Austin NIXONis a 66y.o. male with medical history significant of h/o hypothyroidism, HLD, HTN, CAD s/p DES to LAD in 08/2020, SSS s/p pacemaker in 11/2020, COPD, OSA on cpap, recent h/o right knee placement on 12/15 [facility] multiple falls in the past two weeks, he also reports tested positive for covid 19 on friday   ED Course: Blood pressure (!) 110/55, pulse 64, temperature 98 F (36.7 C), temperature source Oral, resp. rate  18, SpO2 99 %. [facility], found to have acute CVA, neurology consulted, recommend hospitalist to admit, neurology will consult   Hospital Course:  Principal Problem:   Acute CVA (cerebrovascular accident) ([facility] Active Problems:   Coronary artery disease involving native coronary artery of native heart without angina pectoris   OSA on CPAP  Acute CVA -Seen by neurology , per neurology" scattered acute and subacute infarcts in bilateral frontal, parietal, temporal, and occipital lobes infarct -etiology indeterminate cryptogenic versus recent acute COVID infection" -TTE did not reveal cardioembolic source , neurology did not recommend TEE due to age above 656-Pacemaker interrogation per cardiology, he does not need loop recorder due to having pacemaker, verified with neurology  -continue home medication zetia ,crestor  -home bp meds held in the hospital to allow permission hypertension , resume gradually, avoid hypotension  -Neurology recommend aspirin Plavix for 3 weeks followed by Plavix alone and aggressive risk factor modification.  -Follow-up as an outpatient stroke clinic     Diabetes, new diagnosis,  Present with hyperglycemia A1c 6.8 Carb modified diet Diabetes /diet education provided, outpatient education referral made Discharge on metformin Glucometer and testing supplies prescribed    Low back pain MRI spine showed diffuse disc bulge, no high grade spinal canal or neural foraminal stenosis , no evidence of nerve root impingement, detail please refer to original MRI spine Case discussed with neuro spine surgeon Austin. EEllene Routewho recommend conservative management and follow-up outpatient as needed Analgesic and physical therapy     CAD s/p  DES to LAD in 08/2020 Has occasional chest pain respond to nitroglycerin, he is made aware to contact his cardiologist should he has increasing chest pain  Continue home medication   SSS s/p PPM, f/u with EP    HTN Allow permissive hypertension, avoid hypotension  Home medication Imdur held in hospital, SBP in the 110's without Imdur Continue hold for another 3 days, resume gradually   COPD with mild copd exacerbation,  57yr smoker, Quit smoking in 2018  cxr no acute infiltrate  treated with  nebs, mucinex,steroids,  bilateral wheezing resolved   Taper steroids to off, f/u with pulmonology this month as scheduled   Covid 19 Finished oral treatment  no hypoxia, denies sob, cxr no acute findings  improving     Normocytic anemia Suspect had blood loss anemia from recent surgery, as hemoglobin.  On 12/16 was 9.1, hemoglobin today is 9.9 No overt sign of external bleed iron level in 09/2021 was unremarkable History of B12 deficiency, Continue B12 Follow-up with PCP for anemia management     Hypothyroidism, continue Synthroid   BPH  Continue on proscar and flomax Reports having more trouble initating and straining F/u with Austin Oliver  OSA, continue CPAP Reports has not use cpap since after knee surgery Recommend compliance with cpap   Unchanged 3.2 cm infrarenal abdominal aortic aneurysm. Follow-up imaging is recommended every 3 years.   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 If you have any questions about your discharge medications or the care you received while you were in the hospital after you are discharged, you can  call the unit and asked to speak with the hospitalist on call if the hospitalist that took care of you is not available. Once you are discharged, your primary care physician will handle any further medical issues. Please note that NO REFILLS for any discharge medications will be authorized once you are discharged, as it is imperative that you return to your primary care physician (or establish a relationship with a primary care physician if you do not have one) for your aftercare needs so that they can reassess your need for medications and monitor your lab values.  Discharge Instructions     Ambulatory referral to Neurology   Complete by: As directed    [facility] Too complicated for RN to follow. Thanks.   Ambulatory referral to Nutrition and Diabetic Education   Complete by: As directed    Ambulatory referral to Physical Therapy   Complete by: As directed    Diet - low sodium heart healthy   Complete by: As directed    Carb modified diet   Increase activity slowly   Complete by: As directed       Allergies as of 11/23/2021  Reactions   Bee Venom Swelling   Extreme Swelling of site     Cleocin [clindamycin Hcl] Rash   RASH IN BETWEEN FINGERS   Statins Other (See Comments)   Muscles aches with several statins        Medication List     TAKE these medications    acetaminophen 325 MG tablet Commonly known as: TYLENOL Take 1-2 tablets (325-650 mg total) by mouth every 6 (six) hours as needed for mild pain (pain score 1-3 or temp > 100.5).   albuterol 108 (90 Base) MCG/ACT inhaler Commonly known as: VENTOLIN HFA Inhale 2 puffs into the lungs every 6 (six) hours as needed for shortness of breath or wheezing.   aspirin 81 MG chewable tablet Chew 1 tablet (81 mg total) by mouth daily for 21 days. What changed: when to take this   blood glucose meter kit and supplies Kit Dispense based on patient and insurance preference. Use up to four  times daily as directed.   clopidogrel 75 MG tablet Commonly known as: PLAVIX Take 1 tablet (75 mg total) by mouth daily. Start taking on: November 24, 2021   docusate sodium 100 MG capsule Commonly known as: COLACE Take 1 capsule (100 mg total) by mouth 2 (two) times daily.   ezetimibe 10 MG tablet Commonly known as: ZETIA Take 10 mg by mouth in the morning.   finasteride 5 MG tablet Commonly known as: PROSCAR Take 5 mg by mouth daily.   gabapentin 100 MG capsule Commonly known as: NEURONTIN Take 200 mg by mouth at bedtime.   ibuprofen 200 MG tablet Commonly known as: ADVIL Take 400 mg by mouth every 8 (eight) hours as needed (inflammation/pain.).   isosorbide mononitrate 120 MG 24 hr tablet Commonly known as: IMDUR Take 1 tablet (120 mg total) by mouth daily. Please hold this medication for three days, please check your blood pressure twice a day, please discuss with your pcp and cardiology regarding dose adjustment to avoid hypotension. Hold this medication if systolic blood pressure ( top number) is less than 100. What changed: additional instructions   levothyroxine 112 MCG tablet Commonly known as: SYNTHROID Take 112 mcg by mouth daily before breakfast.   loteprednol 0.5 % ophthalmic suspension Commonly known as: LOTEMAX Place 1 drop into the left eye 2 (two) times a week. Mondays & Fridays   metFORMIN 500 MG tablet Commonly known as: GLUCOPHAGE Take 1 tablet (500 mg total) by mouth 2 (two) times daily with a meal.   nitroGLYCERIN 0.4 MG SL tablet Commonly known as: NITROSTAT Place 1 tablet (0.4 mg total) under the tongue every 5 (five) minutes x 3 doses as needed for chest pain.   ondansetron 4 MG tablet Commonly known as: ZOFRAN Take 1 tablet (4 mg total) by mouth every 6 (six) hours as needed for nausea.   oxyCODONE 5 MG immediate release tablet Commonly known as: Oxy IR/ROXICODONE Take 1-2 tablets (5-10 mg total) by mouth every 4 (four) hours as needed  for moderate pain (pain score 4-6).   pantoprazole 40 MG tablet Commonly known as: PROTONIX Take 40 mg by mouth daily after supper.   predniSONE 10 MG tablet Commonly known as: DELTASONE Take prednisone once daily with breakfast Take 30 mg (3tabs) on 1/8, 20 mg (2tabs)on 1/9, 10 mg (1tab) on 1/10, then stop.   ranolazine 1000 MG SR tablet Commonly known as: RANEXA TAKE 1  BY MOUTH TWICE DAILY What changed: See the new instructions.   rosuvastatin 5  MG tablet Commonly known as: CRESTOR TAKE 1 TABLET BY MOUTH DAILY   senna 8.6 MG Tabs tablet Commonly known as: SENOKOT Take 1 tablet (8.6 mg total) by mouth 2 (two) times daily.   shark liver oil-cocoa butter 0.25-3-85.5 % suppository Commonly known as: PREPARATION H Place 1 suppository rectally daily as needed for hemorrhoids.   Spiriva Respimat 2.5 MCG/ACT Aers Generic drug: Tiotropium Bromide Monohydrate Inhale 2 puffs into the lungs daily.   tamsulosin 0.4 MG Caps capsule Commonly known as: FLOMAX Take 0.8 mg by mouth daily with supper.   vitamin B-12 1000 MCG tablet Commonly known as: CYANOCOBALAMIN Take 1,000 mcg by mouth in the morning.               Durable Medical Equipment  (From admission, onward)           Start     Ordered   11/23/21 1114  For home use only DME Walker  Once       Question:  Patient needs a walker to treat with the following condition  Answer:  Acute CVA (cerebrovascular accident) ([place])   11/23/21 1113           Allergies  Allergen Reactions   Bee Venom Swelling    Extreme Swelling of site     Cleocin [Clindamycin Hcl] Rash    RASH IN BETWEEN FINGERS   Statins Other (See Comments)    Muscles aches with several statins    Follow-up Information     [name], MD Follow up in 1 week(s).   Specialty: Family Medicine Why: Hospital discharge follow-up, repeat cbc/bmp at follow up please check your blood sugar, bring blood sugar recording to PCP for further  diabetes medication adjustment please check your blood pressure at home , bring blood pressure record to your pcp to review pcp to follow up on anemia Contact information: [place] [address] [phone]         [name], MD Follow up in 1 month(s).   Specialty: Cardiology Why: for CAD, call for earlier appointment if you have chest pain Contact information: [place] [address] [phone]         [name], MD .   Specialty: Cardiology Why: for pacemaker interrogation Contact information: [address] [place] [address] [phone]         Kristeen Miss, MD Follow up.   Specialty: Neurosurgery Why: as need for low back pain Contact information: [address] [place] [address] [place] [address] [phone]         Garvin Fila, MD. Schedule an appointment as soon as possible for a visit in 1 month(s).   Specialties: Neurology, Radiology Why: stroke clinic Contact information: [address] [place] [address] [phone]         Lucas Mallow, MD Follow up.   Specialty: Urology Why: for enlarged prostate and  bladder issues Contact information: [place] [id number] [phone]                  The results of significant diagnostics from this hospitalization (including imaging, microbiology, ancillary and laboratory) are listed below for reference.    Significant Diagnostic Studies: CT ANGIO HEAD NECK W WO CM  Result Date: 11/21/2021 CLINICAL DATA:  Follow-up examination for acute stroke. EXAM: CT ANGIOGRAPHY HEAD AND NECK TECHNIQUE: Multidetector CT imaging of the  head and neck was performed using the standard protocol during bolus administration of intravenous contrast. Multiplanar CT image reconstructions and MIPs were obtained to evaluate the vascular anatomy. Carotid stenosis measurements (when  applicable) are obtained utilizing NASCET criteria, using the distal internal carotid diameter as the denominator. CONTRAST:  117m OMNIPAQUE IOHEXOL 350 MG/ML SOLN COMPARISON:  Previous MRI from earlier the same day. FINDINGS: CT HEAD FINDINGS Brain: Cerebral volume within normal limits. Previously identified scattered acute ischemic infarcts are largely occult by CT. No associated hemorrhage or mass effect. No other acute large vessel territory infarct or intracranial hemorrhage. No mass lesion, mass effect or midline shift. No hydrocephalus or extra-axial fluid collection. Vascular: No hyperdense vessel. Calcified atherosclerosis present at the skull base. Skull: Scalp soft tissues and calvarium within normal limits. Sinuses: Scattered mucosal thickening noted about the ethmoidal air cells and maxillary sinuses. Mastoid air cells are clear. Orbits: Globes orbital soft tissues demonstrate no acute finding. Review of the MIP images confirms the above findings CTA NECK FINDINGS Aortic arch: Visualized aortic arch normal caliber with normal branch pattern. No stenosis about the origin of the great vessels. Mild atheromatous change about the visualized proximal descending intrathoracic aorta. Right carotid system: Right CCA patent without stenosis. Scattered atheromatous change about the right carotid bulb/proximal right ICA without hemodynamically significant stenosis. Right ICA patent distally without stenosis or dissection. Left carotid system: Left CCA patent from its origin to the bifurcation. Mixed but predominant soft plaque about the left carotid bulb/proximal left ICA with estimated 40% stenosis by NASCET criteria. Left ICA patent distally without stenosis or dissection. Vertebral arteries: Both vertebral arteries arise from the subclavian arteries. No proximal subclavian artery stenosis. Atheromatous plaque at the origins of both vertebral arteries with moderate approximate 50% stenoses bilaterally.  Vertebral arteries otherwise patent distally without stenosis or dissection. Skeleton: No discrete or worrisome osseous lesions. Prior fusion at C5-6. Adjacent segment disease with moderate spondylosis at C6-7. Other neck: No other acute soft tissue abnormality within the neck. Upper chest: Visualized upper chest demonstrates no acute finding. Left-sided pacemaker/AICD noted. Review of the MIP images confirms the above findings CTA HEAD FINDINGS Anterior circulation: Petrous segments patent bilaterally. Scattered atheromatous plaque within the carotid siphons without hemodynamically significant stenosis. A1 segments patent bilaterally. Normal anterior communicating artery complex. Both ACAs patent to their distal aspects without stenosis. Left ACA is strongly dominant, with a hypoplastic right ACA. No M1 stenosis or occlusion. Normal MCA bifurcations. Distal MCA branches perfused and symmetric. Posterior circulation: Both V4 segments patent to the vertebrobasilar junction without stenosis. Both PICA origins patent and normal. Basilar patent to its distal aspect without stenosis. Superior cerebral arteries patent bilaterally. Both PCAs primarily supplied via the basilar. Right PCA widely patent to its distal aspect. There is a focal severe stenosis with near occlusion of the left PCA at the proximal left P2 segment (series 12, image 127). Scant irregular thready flow seen within the left PCA distally. Venous sinuses: Patent allowing for timing the contrast bolus. Markedly hypoplastic right transverse sinus noted. Anatomic variants: None significant.  No aneurysm. Review of the MIP images confirms the above findings IMPRESSION: CT HEAD IMPRESSION: 1. Previously identified scattered ischemic infarcts are largely occult by CT. No intracranial hemorrhage or significant mass effect. 2. No other new acute intracranial abnormality. CTA HEAD AND NECK IMPRESSION: 1. Negative CTA for large vessel occlusion. 2. Focal severe  near occlusive stenosis involving the proximal left P2 segment. Scant irregular thready flow within the left PCA distribution  distally. 3. 40% atheromatous stenosis about the proximal left ICA. 4. Moderate approximate 50% stenoses at the origins of both vertebral arteries. Electronically Signed   By: Jeannine Boga M.D.   On: 11/21/2021 03:23   DG Lumbar Spine Complete  Result Date: 11/20/2021 CLINICAL DATA:  66 year old male with multiple recent falls. Left leg weakness. EXAM: LUMBAR SPINE - COMPLETE 4+ VIEW COMPARISON:  CT Abdomen and Pelvis 07/17/2021. FINDINGS: Transitional anatomy. Four lumbar type vertebrae. For the purposes of this report hypoplastic ribs are designated at T12 (full size ribs at T11) and sacralized L5 level. No pars fracture or spondylolisthesis. Straightening of lumbar lordosis appears stable. Multilevel degenerative lumbar endplate spurring but relatively preserved disc spaces. Sacrum and SI joints appear within normal limits. No acute osseous abnormality identified. Aortoiliac calcified atherosclerosis. Left hip arthroplasty. Bulky right nephrolithiasis redemonstrated. Negative visible bowel gas. IMPRESSION: 1. Transitional anatomy. No acute osseous abnormality identified in the lumbar spine. 2. Chronic right nephrolithiasis. Electronically Signed   By: Genevie Ann M.D.   On: 11/20/2021 05:58   CT Head Wo Contrast  Result Date: 11/20/2021 CLINICAL DATA:  66 year old male with multiple recent falls. Left leg weakness. EXAM: CT HEAD WITHOUT CONTRAST TECHNIQUE: Contiguous axial images were obtained from the base of the skull through the vertex without intravenous contrast. COMPARISON:  None. FINDINGS: Brain: Cerebral volume is within normal limits for age. No midline shift, ventriculomegaly, mass effect, evidence of mass lesion, intracranial hemorrhage or evidence of cortically based acute infarction. Asymmetric nodular hypodensity in the ventral medial left thalamus. Elsewhere  gray-white matter differentiation is within normal limits throughout the brain. Vascular: Mild Calcified atherosclerosis at the skull base. Faint basal ganglia vascular calcifications also. No suspicious intracranial vascular hyperdensity. Skull: Intact, negative. Sinuses/Orbits: Mild paranasal sinus mucosal thickening greater on the left. Tympanic cavities and mastoids are clear. Other: Visualized orbits and scalp soft tissues are within normal limits. IMPRESSION: 1. 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 Right side symptoms would result. 2. Otherwise negative for age noncontrast CT appearance of the brain. 3. Minor paranasal sinus mucosal thickening. Electronically Signed   By: Genevie Ann M.D.   On: 11/20/2021 05:38   MR BRAIN WO CONTRAST  Result Date: 11/20/2021 CLINICAL DATA:  Neuro deficit, acute, stroke suspected EXAM: MRI HEAD WITHOUT CONTRAST TECHNIQUE: Multiplanar, multiecho pulse sequences of the brain and surrounding structures were obtained without intravenous contrast. COMPARISON:  CT head from the same day. FINDINGS: Brain: Many scattered acute or early subacute infarcts in bilateral frontal and parietal lobes, left temporal and occipital lobes, left aspect of the splenium of the corpus callosum, and left cerebellum. The majority of these infarcts are small, although there are some areas of somewhat confluent infarct in the left parietal and occipital lobes and left aspect of the splenium corpus callosum. Associated edema without significant mass effect. No evidence of acute hemorrhage, hydrocephalus, midline shift, extra-axial fluid collection, or mass lesion. Vascular: Major arterial flow voids are maintained skull base. Skull and upper cervical spine: Normal marrow signal. Sinuses/Orbits: Mild paranasal sinus mucosal thickening. Unremarkable orbits. Other: Trace bilateral mastoid effusions.  The IMPRESSION: Many scattered acute or early subacute infarcts in bilateral  frontal and parietal lobes, left temporal and occipital lobes, left aspect of the splenium of the corpus callosum, and left cerebellum. Given involvement of multiple vascular territories, consider a central embolic etiology. Associated edema without significant mass effect. Electronically Signed   By: Margaretha Sheffield M.D.   On: 11/20/2021 14:40  MR Lumbar Spine W Wo Contrast  Result Date: 11/20/2021 CLINICAL DATA:  Weakness in left leg, multiple falls in the past 2 weeks EXAM: MRI LUMBAR SPINE WITHOUT AND WITH CONTRAST TECHNIQUE: Multiplanar and multiecho pulse sequences of the lumbar spine were obtained without and with intravenous contrast. CONTRAST:  31m GADAVIST GADOBUTROL 1 MMOL/ML IV SOLN COMPARISON:  Lumbar spine radiographs 11/20/2021 FINDINGS: Segmentation: Transitional anatomy is noted there is sacralization of the L5 vertebral body. For the purposes of this report, the lowest rudimentary disc space is designated L5-S1 which is in keeping with a lumbar spine radiographs from earlier the same day. Alignment:  Normal. Vertebrae: Vertebral body heights are preserved. There is mild degenerative marrow signal abnormality at L2-L3. Small Schmorl's nodes are noted along the superior L1 and L2 endplates. There is no suspicious marrow signal abnormality. There is no abnormal marrow enhancement. Conus medullaris and cauda equina: Conus extends to the mid L1 level. Conus and cauda equina appear normal. There is no abnormal enhancement of the conus or cauda equina nerve roots. Paraspinal and other soft tissues: There is mild aneurysmal dilation of the infrarenal abdominal aorta measuring up to 3.2 cm, unchanged compared to the prior abdominal CT of 07/17/2021. Signal abnormality in the right kidney likely reflects the prominent renal stone as seen on prior CT. Disc levels: There is mild disc desiccation and narrowing at L4-L5. The other disc spaces are overall preserved. There is mild multilevel facet  arthropathy, most advanced at L3-L4. Small facet joint effusions are noted bilaterally at L2-L3 and on the left at L3-L4 and L4-L5. T12-L1: No significant spinal canal or neural foraminal stenosis L1-L2: There is a mild disc bulge and mild bilateral facet arthropathy resulting in mild left and no significant right neural foraminal stenosis. L2-L3: There is a mild disc bulge and mild bilateral facet arthropathy resulting in mild bilateral neural foraminal stenosis and mild crowding of the left subarticular zone without evidence of frank nerve root impingement. L3-L4: There is a diffuse disc bulge with bilateral foraminal components, ligamentum flavum thickening, and bilateral facet arthropathy resulting in mild spinal canal stenosis with crowding of the bilateral subarticular zones and possible mass effect on the traversing left L4 nerve root, and mild bilateral neural foraminal stenosis. L4-L5: There is a diffuse disc bulge with a superimposed central protrusion and mild bilateral facet arthropathy resulting in crowding of the bilateral subarticular zones with possible mass effect on the traversing left L5 nerve root and mild right and no significant left neural foraminal stenosis. L5-S1: No significant spinal canal or neural foraminal stenosis. IMPRESSION: 1. Diffuse disc bulge with superimposed central protrusion at L4-L5 resulting in crowding of the subarticular zones with possible mass effect on the traversing left L5 nerve root. 2. Mild disc bulge at L3-L4 resulting in crowding of the subarticular zones with possible mass effect on the traversing left L4 nerve root. 3. Otherwise, no high-grade spinal canal or neural foraminal stenosis, and no other evidence of nerve root impingement. 4. Multilevel facet arthropathy, most advanced at L3-L4, with small facet joint effusions bilaterally at L2-L3 and on the left at L3-L4 and L4-L5. 5. Unchanged 3.2 cm infrarenal abdominal aortic aneurysm. Follow-up imaging is  recommended every 3 years. Electronically Signed   By: PValetta MoleM.D.   On: 11/20/2021 15:07   DG CHEST PORT 1 VIEW  Result Date: 11/20/2021 CLINICAL DATA:  cough Per triage: Pt complains of increased weakness to his left leg. Pt states that he has had multiple falls  in the past 2 weeks due to his left leg giving out. EXAM: PORTABLE CHEST 1 VIEW COMPARISON:  Chest x-ray 12/04/2020, CT chest 10/08/2021 FINDINGS: The heart and mediastinal contours are unchanged. Left chest wall to the pacemaker noted in similar position. No focal consolidation. No pulmonary edema. No pleural effusion. No pneumothorax. No acute osseous abnormality. IMPRESSION: No active disease. Electronically Signed   By: Iven Finn M.D.   On: 11/20/2021 18:32   DG Knee Right Port  Result Date: 10/31/2021 CLINICAL DATA:  Right knee replacement EXAM: PORTABLE RIGHT KNEE - 1-2 VIEW COMPARISON:  None. FINDINGS: Changes of right knee replacement. No hardware bony complicating feature. Soft tissue and joint space gas noted. IMPRESSION: Right knee replacement.  No visible complicating feature. Electronically Signed   By: Rolm Baptise M.D.   On: 10/31/2021 17:07   ECHOCARDIOGRAM COMPLETE  Result Date: 11/20/2021    ECHOCARDIOGRAM REPORT   Patient Name:   KRU ALLMAN Date of Exam: 11/20/2021 Medical Rec #:  [id number]      Height:       68.5 in Accession #:    [id number]     Weight:       234.2 lb Date of Birth:  14-Oct-1956      BSA:          2.198 m Patient Age:    36 years       BP:           110/55 mmHg Patient Gender: M              HR:           65 bpm. Exam Location:  Inpatient Procedure: 2D Echo, Cardiac Doppler and Color Doppler Indications:    Stroke  History:        Patient has prior history of Echocardiogram examinations, most                 recent 10/02/2020. CAD; Risk Factors:Hypertension, Dyslipidemia                 and Former Smoker. Covid 19 positive.  Sonographer:    Merrie Roof RDCS Referring Phys: [id number] [place]  1. Left ventricular ejection fraction, by estimation, is 50 to 55%. The left ventricle has normal function. The left ventricle has no regional wall motion abnormalities. There is mild concentric left ventricular hypertrophy.  2. Right ventricular systolic function is normal. The right ventricular size is normal.  3. Left atrial size was mildly dilated.  4. The mitral valve is normal in structure. Trivial mitral valve regurgitation.  5. The aortic valve is tricuspid. There is mild calcification of the aortic valve. There is mild thickening of the aortic valve. Aortic valve regurgitation is not visualized. Aortic valve sclerosis/calcification is present, without any evidence of aortic stenosis.  6. The inferior vena cava is normal in size with greater than 50% respiratory variability, suggesting right atrial pressure of 3 mmHg. Comparison(s): Compared to prior TTE 09/2020, there is no significant change. Conclusion(s)/Recommendation(s): No intracardiac source of embolism detected on this transthoracic study. Consider a transesophageal echocardiogram to exclude cardiac source of embolism if clinically indicated. FINDINGS  Left Ventricle: Left ventricular ejection fraction, by estimation, is 50 to 55%. The left ventricle has normal function. The left ventricle has no regional wall motion abnormalities. The left ventricular internal cavity size was normal in size. There is  mild concentric left ventricular hypertrophy. Right Ventricle: The right ventricular size is normal.  No increase in right ventricular wall thickness. Right ventricular systolic function is normal. Left Atrium: Left atrial size was mildly dilated. Right Atrium: Right atrial size was normal in size. Pericardium: There is no evidence of pericardial effusion. Mitral Valve: The mitral valve is normal in structure. There is mild thickening of the mitral valve leaflet(s). There is mild calcification of the mitral valve leaflet(s). Mild  mitral annular calcification. Trivial mitral valve regurgitation. Tricuspid Valve: The tricuspid valve is normal in structure. Tricuspid valve regurgitation is trivial. Aortic Valve: The aortic valve is tricuspid. There is mild calcification of the aortic valve. There is mild thickening of the aortic valve. Aortic valve regurgitation is not visualized. Aortic valve sclerosis/calcification is present, without any evidence of aortic stenosis. Pulmonic Valve: The pulmonic valve was normal in structure. Pulmonic valve regurgitation is trivial. Aorta: The aortic root is normal in size and structure. Venous: The inferior vena cava is normal in size with greater than 50% respiratory variability, suggesting right atrial pressure of 3 mmHg. IAS/Shunts: The atrial septum is grossly normal. Additional Comments: A device lead is visualized.  LEFT VENTRICLE PLAX 2D LVIDd:         4.10 cm LVIDs:         2.90 cm LV PW:         1.30 cm LV IVS:        1.10 cm  LEFT ATRIUM             Index        RIGHT ATRIUM           Index LA diam:        4.40 cm 2.00 cm/m   RA Area:     12.60 cm LA Vol (A2C):   49.9 ml 22.71 ml/m  RA Volume:   27.80 ml  12.65 ml/m LA Vol (A4C):   50.0 ml 22.75 ml/m LA Biplane Vol: 53.3 ml 24.25 ml/m   AORTA Ao Root diam: 3.30 cm Gwyndolyn Kaufman MD Electronically signed by Gwyndolyn Kaufman MD Signature Date/Time: 11/20/2021/6:07:26 PM    Final    VAS [place] LOWER EXTREMITY VENOUS (DVT)  Result Date: 11/21/2021  Lower Venous DVT Study Patient Name:  ASLAN HIMES  Date of Exam:   11/21/2021 Medical Rec #: [id number]       Accession #:    [id number] Date of Birth: 1956-06-15       Patient Gender: M Patient Age:   55 years Exam Location:  [facility] Procedure:      VAS [place] LOWER EXTREMITY VENOUS (DVT) Referring Phys: Annamaria Boots [place] --------------------------------------------------------------------------------  Indications: Stroke, and Covid-19.  Comparison Study: No prior study Performing Technologist: Sharion Dove RVS  Examination Guidelines: A complete evaluation includes B-mode imaging, spectral Doppler, color Doppler, and power Doppler as needed of all accessible portions of each vessel. Bilateral testing is considered an integral part of a complete examination. Limited examinations for reoccurring indications may be performed as noted. The reflux portion of the exam is performed with the patient in reverse Trendelenburg.  +---------+---------------+---------+-----------+----------+--------------+  RIGHT     Compressibility Phasicity Spontaneity Properties Thrombus Aging  +---------+---------------+---------+-----------+----------+--------------+  CFV       Full            Yes       Yes                                    +---------+---------------+---------+-----------+----------+--------------+  SFJ       Full                                                             +---------+---------------+---------+-----------+----------+--------------+  FV Prox   Full                                                             +---------+---------------+---------+-----------+----------+--------------+  FV Mid    Full                                                             +---------+---------------+---------+-----------+----------+--------------+  FV Distal Full                                                             +---------+---------------+---------+-----------+----------+--------------+  PFV       Full                                                             +---------+---------------+---------+-----------+----------+--------------+  POP       Full            Yes       Yes                                    +---------+---------------+---------+-----------+----------+--------------+  PTV       Full                                                             +---------+---------------+---------+-----------+----------+--------------+  PERO      Full                                                              +---------+---------------+---------+-----------+----------+--------------+   +---------+---------------+---------+-----------+----------+--------------+  LEFT      Compressibility Phasicity Spontaneity Properties Thrombus Aging  +---------+---------------+---------+-----------+----------+--------------+  CFV       Full            Yes       Yes                                    +---------+---------------+---------+-----------+----------+--------------+  SFJ       Full                                                             +---------+---------------+---------+-----------+----------+--------------+  FV Prox   Full                                                             +---------+---------------+---------+-----------+----------+--------------+  FV Mid    Full                                                             +---------+---------------+---------+-----------+----------+--------------+  FV Distal Full                                                             +---------+---------------+---------+-----------+----------+--------------+  PFV       Full                                                             +---------+---------------+---------+-----------+----------+--------------+  POP       Full            Yes       Yes                                    +---------+---------------+---------+-----------+----------+--------------+  PTV       Full                                                             +---------+---------------+---------+-----------+----------+--------------+  PERO      Full                                                             +---------+---------------+---------+-----------+----------+--------------+     Summary: BILATERAL: - No evidence of deep vein thrombosis seen in the lower extremities, bilaterally. -No evidence of popliteal cyst, bilaterally.   *See table(s) above for measurements and observations. Electronically signed by Jamelle Haring on 11/21/2021 at 4:59:02 PM.     Final     Microbiology: Recent Results (from the  past 240 hour(s))  Resp Panel by RT-PCR (Flu A&B, Covid) Nasopharyngeal Swab     Status: Abnormal   Collection Time: 11/20/21  2:53 PM   Specimen: Nasopharyngeal Swab; Nasopharyngeal(NP) swabs in vial transport medium  Result Value Ref Range Status   SARS Coronavirus 2 by RT PCR POSITIVE (A) NEGATIVE Final    Comment: (NOTE) SARS-CoV-2 target nucleic acids are DETECTED.  The SARS-CoV-2 RNA is generally detectable in upper respiratory specimens during the acute phase of infection. Positive results are indicative of the presence of the identified virus, but do not rule out bacterial infection or co-infection with other pathogens not detected by the test. Clinical correlation with patient history and other diagnostic information is necessary to determine patient infection status. The expected result is Negative.  Fact Sheet for Patients: EntrepreneurPulse.com.au  Fact Sheet for Healthcare Providers: IncredibleEmployment.be  This test is not yet approved or cleared by the [place] FDA and  has been authorized for detection and/or diagnosis of SARS-CoV-2 by FDA under an Emergency Use Authorization (EUA).  This EUA will remain in effect (meaning this test can be used) for the duration of  the COVID-19 declaration under Section 564(b)(1) of the A ct, 21 U.S.C. section 360bbb-3(b)(1), unless the authorization is terminated or revoked sooner.     Influenza A by PCR NEGATIVE NEGATIVE Final   Influenza B by PCR NEGATIVE NEGATIVE Final    Comment: (NOTE) The Xpert Xpress SARS-CoV-2/FLU/RSV plus assay is intended as an aid in the diagnosis of influenza from Nasopharyngeal swab specimens and should not be used as a sole basis for treatment. Nasal washings and aspirates are unacceptable for Xpert Xpress SARS-CoV-2/FLU/RSV testing.  Fact Sheet for  Patients: EntrepreneurPulse.com.au  Fact Sheet for Healthcare Providers: IncredibleEmployment.be  This test is not yet approved or cleared by the [place] FDA and has been authorized for detection and/or diagnosis of SARS-CoV-2 by FDA under an Emergency Use Authorization (EUA). This EUA will remain in effect (meaning this test can be used) for the duration of the COVID-19 declaration under Section 564(b)(1) of the Act, 21 U.S.C. section 360bbb-3(b)(1), unless the authorization is terminated or revoked.  Performed at [facility], [place] [address] [place], [place] [address]      Labs: Basic Metabolic Panel: Recent Labs  Lab 11/19/21 2038 11/21/21 0328 11/22/21 0118 11/23/21 0052  NA 135 137 136 134*  K 4.4 3.9 4.1 4.2  CL 103 106 103 102  CO2 _0 GLUCOSE 233* 167* 316* 198*  BUN _1 CREATININE 1.12 1.11 1.14 1.21  CALCIUM 9.0 8.7* 9.1 9.3  MG  --   --  1.9  --    Liver Function Tests: Recent Labs  Lab 11/19/21 2038 11/21/21 0328  AST 15 14*  ALT 11 10  ALKPHOS 68 62  BILITOT 1.6* 1.7*  PROT 7.2 6.1*  ALBUMIN 3.8 3.2*   No results for input(s): LIPASE, AMYLASE in the last 168 hours. No results for input(s): AMMONIA in the last 168 hours. CBC: Recent Labs  Lab 11/19/21 2038 11/21/21 0328 11/22/21 0118  WBC 4.2 2.7* 3.2*  NEUTROABS 2.5  --  2.5  HGB 9.9* 9.0* 9.5*  HCT 31.0* 28.8* 29.4*  MCV 96.6 97.6 94.5  PLT 184 147* 160   Cardiac Enzymes: No results for input(s): CKTOTAL, CKMB, CKMBINDEX, TROPONINI in the last 168 hours. BNP: BNP (last 3 results) No results for input(s): BNP in the last 8760 hours.  ProBNP (  last 3 results) Recent Labs    04/04/21 1449  PROBNP 46    CBG: Recent Labs  Lab 11/22/21 0743 11/22/21 1146 11/22/21 1604 11/22/21 2020 11/23/21 0741  GLUCAP 223* 342* 352* 258* 258*       Signed:  Florencia Reasons MD, PhD, FACP  [facility] 11/23/2021, 11:39  AM

## 2021-11-23 NOTE — Plan of Care (Addendum)
Discussed with Dr. Florencia Reasons that pt has pacemaker in place, therefore no need for loop recorder at this time. DVT neg and 2D echo no specific concerns, do not feel TEE will be high yield. Recommend to continue DAPT and follow up closely with neurology and cardiology EP for monitoring. Recommend pacemaker interrogation to rule out afib. Pt will follow up with stroke clinic Dr. Leonie Man at [facility] in about 4 weeks.  Rosalin Hawking, MD PhD Stroke Neurology 11/23/2021 8:16 AM

## 2021-11-26 SURGERY — ECHOCARDIOGRAM, TRANSESOPHAGEAL
Anesthesia: Monitor Anesthesia Care

## 2021-11-28 NOTE — Progress Notes (Signed)
Cardiology Office Note Date:  11/29/2021  Patient ID:  Austin Oliver, Austin Oliver [birth year], MRN [id number] PCP:  [name], MD  Cardiologist:  Dr. [name] Electrophysiologist: Dr. Curt Bears    Chief Complaint: f/u hospital visit  History of Present Illness: Austin Oliver is a 66 y.o. male with history of CAD (PCI to LAD 08/24/20, > CP repeat cath with stable findings), HTN, HLD (statin intolerant), symptomatic bradycardia w/PPM, COPD, morbid obesity, OSA w/CPAP  He comes today t be seen for Dr. Curt Bears, last seen by him April 2022, doing well, device functioning, no changes made, planned for a 43movisit.  Saw Dr. CGasper Sells8/2/22 with c/o DOE, planned for PFTs, sleep study, discussed with Dr. CCurt Bears not felt there would see benefit by increasing base pacing rate  Had a RIGHT knee surgery 12/15 post op recovery ambulating with a walker, getting PT and noted that he was having LEFT LE weakness and eventually dragging his leg.  Also was COVID + and thought the medicine for this was making him feel disoriented [facility] and found with multiple acute/subacute infarcts in bilateral hemispheres concerning for embolic etiology vs COVID Hospitalized 11/19/21-11/23/21   TODAY He comes with his wife, ambulates in with a walker. He is making progress post stroke, reports he had significant proximal LLE weakness especially, slowly improving. No CP, palpitations or cardiac awareness of any kind prior to or since his stroke No near syncope or syncope. No SOB   Device information Abbott dual chamber PPM implanted 12/04/20   Past Medical History:  Diagnosis Date   AICD (automatic cardioverter/defibrillator) present    for sick sinus syndrome   Cirrhosis of liver (HCC)    COPD (chronic obstructive pulmonary disease) (HCC)    Coronary artery calcification seen on CAT scan 01/14/2017   Essential hypertension 09/11/2020   GERD (gastroesophageal reflux disease)     History of kidney stones    Hyperlipidemia    Hypothyroidism    OSA on CPAP    Mild with AHI 7.8/hr >>on CPAP   Osteoarthritis    Shingles 2012    Past Surgical History:  Procedure Laterality Date   CORONARY STENT INTERVENTION N/A 08/24/2020   Procedure: CORONARY STENT INTERVENTION;  Surgeon: ENelva Bush MD;  [facility];  Service: Cardiovascular;  Laterality: N/A;   HEEL SPUR SURGERY Left 80's   INTRAVASCULAR PRESSURE WIRE/FFR STUDY N/A 04/25/2021   Procedure: INTRAVASCULAR PRESSURE WIRE/FFR STUDY;  Surgeon: ENelva Bush MD;  [facility];  Service: Cardiovascular;  Laterality: N/A;   INTRAVASCULAR ULTRASOUND/IVUS N/A 08/24/2020   Procedure: Intravascular Ultrasound/IVUS;  Surgeon: ENelva Bush MD;  [facility];  Service: Cardiovascular;  Laterality: N/A;   KNEE ARTHROPLASTY Right 10/31/2021   Procedure: COMPUTER ASSISTED TOTAL KNEE ARTHROPLASTY;  Surgeon: SRod Can MD;  Location: [facility];  Service: Orthopedics;  Laterality: Right;   KNEE SURGERY Bilateral    numerous times   LEFT HEART CATH AND CORONARY ANGIOGRAPHY N/A 08/24/2020   Procedure: LEFT HEART CATH AND CORONARY ANGIOGRAPHY;  Surgeon: ENelva Bush MD;  [facility];  Service: Cardiovascular;  Laterality: N/A;   LEFT HEART CATH AND CORONARY ANGIOGRAPHY N/A 04/25/2021   Procedure: LEFT HEART CATH AND CORONARY ANGIOGRAPHY;  Surgeon: ENelva Bush MD;  [facility];  Service: Cardiovascular;  Laterality: N/A;   NECK SURGERY  70's   PACEMAKER IMPLANT N/A 12/04/2020   Procedure: PACEMAKER IMPLANT;  Surgeon: CCurt Bears  Ocie Doyne, MD;  Location: [place] [facility];  Service: Cardiovascular;  Laterality: N/A;   TOTAL HIP ARTHROPLASTY Left 03/09/2017   Procedure: LEFT TOTAL HIP ARTHROPLASTY ANTERIOR APPROACH;  Surgeon: Rod Can, MD;  Location: [place];  Service: Orthopedics;  Laterality: Left;  Dr. requesting RNFA    Current  Outpatient Medications  Medication Sig Dispense Refill   acetaminophen (TYLENOL) 325 MG tablet Take 1-2 tablets (325-650 mg total) by mouth every 6 (six) hours as needed for mild pain (pain score 1-3 or temp > 100.5).     albuterol (VENTOLIN HFA) 108 (90 Base) MCG/ACT inhaler Inhale 2 puffs into the lungs every 6 (six) hours as needed for shortness of breath or wheezing.     aspirin 81 MG chewable tablet Chew 1 tablet (81 mg total) by mouth daily for 21 days. 21 tablet 0   blood glucose meter kit and supplies KIT Dispense based on patient and insurance preference. Use up to four times daily as directed. 1 each 0   clopidogrel (PLAVIX) 75 MG tablet Take 1 tablet (75 mg total) by mouth daily. 30 tablet 2   docusate sodium (COLACE) 100 MG capsule Take 1 capsule (100 mg total) by mouth 2 (two) times daily. 10 capsule 0   ezetimibe (ZETIA) 10 MG tablet Take 10 mg by mouth in the morning.     finasteride (PROSCAR) 5 MG tablet Take 5 mg by mouth daily.     gabapentin (NEURONTIN) 100 MG capsule Take 200 mg by mouth at bedtime.     ibuprofen (ADVIL) 200 MG tablet Take 400 mg by mouth every 8 (eight) hours as needed (inflammation/pain.).     isosorbide mononitrate (IMDUR) 120 MG 24 hr tablet Take 1 tablet (120 mg total) by mouth daily. Please hold this medication for three days, please check your blood pressure twice a day, please discuss with your pcp and cardiology regarding dose adjustment to avoid hypotension. Hold this medication if systolic blood pressure ( top number) is less than 100. 90 tablet 2   levothyroxine (SYNTHROID) 112 MCG tablet Take 112 mcg by mouth daily before breakfast.     loteprednol (LOTEMAX) 0.5 % ophthalmic suspension Place 1 drop into the left eye 2 (two) times a week. Mondays & Fridays     metFORMIN (GLUCOPHAGE) 500 MG tablet Take 1 tablet (500 mg total) by mouth 2 (two) times daily with a meal. 60 tablet 0   nitroGLYCERIN (NITROSTAT) 0.4 MG SL tablet Place 1 tablet (0.4 mg total)  under the tongue every 5 (five) minutes x 3 doses as needed for chest pain. 25 tablet 4   pantoprazole (PROTONIX) 40 MG tablet Take 40 mg by mouth daily after supper.      ranolazine (RANEXA) 1000 MG SR tablet TAKE 1  BY MOUTH TWICE DAILY (Patient taking differently: Take 1,000 mg by mouth 2 (two) times daily.) 180 tablet 2   rosuvastatin (CRESTOR) 5 MG tablet TAKE 1 TABLET BY MOUTH DAILY (Patient taking differently: Take 5 mg by mouth daily.) 90 tablet 3   senna (SENOKOT) 8.6 MG TABS tablet Take 1 tablet (8.6 mg total) by mouth 2 (two) times daily. 120 tablet 0   shark liver oil-cocoa butter (PREPARATION H) 0.25-3-85.5 % suppository Place 1 suppository rectally daily as needed for hemorrhoids.     tamsulosin (FLOMAX) 0.4 MG CAPS capsule Take 0.8 mg by mouth daily with supper.     Tiotropium Bromide Monohydrate (SPIRIVA RESPIMAT) 2.5 MCG/ACT AERS Inhale 2 puffs into the  lungs daily. 4 g 3   vitamin B-12 (CYANOCOBALAMIN) 1000 MCG tablet Take 1,000 mcg by mouth in the morning.     No current facility-administered medications for this visit.    Allergies:   Bee venom, Cleocin [clindamycin hcl], and Statins   Social History:  The patient  reports that he quit smoking about 4 years ago. His smoking use included cigarettes. He has a 90.00 pack-year smoking history. He has never used smokeless tobacco. He reports that he does not drink alcohol and does not use drugs.   Family History:  The patient's family history includes COPD in his father; Dementia in his mother; Heart Problems in his father; Lung cancer in his father.  ROS:  Please see the history of present illness.    All other systems are reviewed and otherwise negative.   PHYSICAL EXAM:  VS:  BP (!) 108/56    Pulse 62    Ht _0  (1.753 m)    Wt 218 lb (98.9 kg)    SpO2 98%    BMI 32.19 kg/m  BMI: Body mass index is 32.19 kg/m. Well nourished, well developed, in no acute distress HEENT: normocephalic, atraumatic Neck: no JVD, carotid  bruits or masses Cardiac:  RRR; no significant murmurs, no rubs, or gallops Lungs:  CTA b/l, no wheezing, rhonchi or rales Abd: soft, nontender MS: no deformity or atrophy Ext: no edema Skin: warm and dry, no rash Neuro:  No gross deficits appreciated Psych: euthymic mood, full affect  PPM site is stable, no tethering or discomfort   EKG:  not done today  Device interrogation done today and reviewed by myself:  Battery and lead measurements are good RA lead threshold slightly higher PW was adjusted to maintain 2:1 safety margin No ASM episodes since 11/20/21 (presumed hospital interrogation date) AP 87% VP <1  11/21/21: LE venous US Summary:  BILATERAL:  - No evidence of deep vein thrombosis seen in the lower extremities,  bilaterally.  -No evidence of popliteal cyst, bilaterally.    11/20/21: TTE IMPRESSIONS   1. Left ventricular ejection fraction, by estimation, is 50 to 55%. The  left ventricle has normal function. The left ventricle has no regional  wall motion abnormalities. There is mild concentric left ventricular  hypertrophy.   2. Right ventricular systolic function is normal. The right ventricular  size is normal.   3. Left atrial size was mildly dilated.   4. The mitral valve is normal in structure. Trivial mitral valve  regurgitation.   5. The aortic valve is tricuspid. There is mild calcification of the  aortic valve. There is mild thickening of the aortic valve. Aortic valve  regurgitation is not visualized. Aortic valve sclerosis/calcification is  present, without any evidence of  aortic stenosis.   6. The inferior vena cava is normal in size with greater than 50%  respiratory variability, suggesting right atrial pressure of 3 mmHg.   Comparison(s): Compared to prior TTE 09/2020, there is no significant  change.    Transthoracic Echocardiogram: Date: 10/02/20 Results: 1. Left ventricular ejection fraction, by estimation, is 50 to 55%. The  left  ventricle has low normal function. The left ventricle has no regional  wall motion abnormalities. The left ventricular internal cavity size was  mildly dilated. Left ventricular  diastolic parameters were normal.   2. Right ventricular systolic function is normal. The right ventricular  size is normal. Tricuspid regurgitation signal is inadequate for assessing  PA pressure.   3. Left atrial  size was mildly dilated.   4. The mitral valve is normal in structure. No evidence of mitral valve  regurgitation.   5. The aortic valve is tricuspid. There is mild calcification of the  aortic valve. There is mild thickening of the aortic valve. Aortic valve  regurgitation is trivial. Mild aortic valve sclerosis is present, with no  evidence of aortic valve stenosis.   6. The inferior vena cava is normal in size with greater than 50%  respiratory variability, suggesting right atrial pressure of 3 mmHg.   NonCardiac CT : Date: 08/27/2020 Results: Aortic Atherosclerosis with notable LAD disease   NM Stress Testing: Date: 08/06/20 Results: NM Stress 08/06/20 Nuclear stress EF: 57%. There was no ST segment deviation noted during stress. The study is normal. This is a low risk study. The left ventricular ejection fraction is normal (55-65%).   Normal pharmacologic nuclear stress test with no evidence for prior infarct or ischemia. LVEF 57%. Left ventricle appears mildly dilated.   Left/Right Heart Catheterizations: Date:08/24/20 Results: Conclusions: Severe single-vessel coronary artery disease with sequential 90% ostial/proximal, 40% mid, and 50% mid/distal LAD stenoses as well as 60% D3 lesion. Moderate, non-obstructive coronary artery disease involving the LCx and RCA. Normal left ventricular systolic function with upper normal filling pressure. Successful PCI to the ostial/proximal LAD using Resolute Onyx 3.0 x 12 mm drug-eluting stent (postdilated to 3.6 mm) with 0% residual stenosis and  TIMI-3 flow.   Recommendations: Dual antiplatelet therapy with aspirin and ticagrelor for 12 months. Aggressive secondary prevention. Medical therapy of non-critical mid/distal LAD, LCx, and RCA disease. Anticipate same-day discharge if no post-catheterization complications occur.   Date: 04/25/21 Results:  Conclusions: Mild to moderate, non-obstructive coronary artery disease, as detailed below.  Overall appearance is similar to completion of last year's catheterization.  LAD, D3, and RCA/rPLA disease is not hemodynamically significant by RFR. Widely patent ostial/proximal LAD stent. Mildly elevated left ventricular filling pressure (LVEDP 15-20 mmHg).   Recommendations: Continue aggressive secondary prevention of coronary artery disease. Consider workup for alternative etiologies of persistent atypical chest pain and fatigue.  Recent Labs: 04/04/2021: NT-Pro BNP 46 11/21/2021: ALT 10 11/22/2021: Hemoglobin 9.5; Magnesium 1.9; Platelets 160 11/23/2021: BUN 21; Creatinine, Ser 1.21; Potassium 4.2; Sodium 134  11/21/2021: Cholesterol 66; HDL 12; LDL Cholesterol 25; Total CHOL/HDL Ratio 5.5; Triglycerides 146; VLDL 29   Estimated Creatinine Clearance: 69.7 mL/min (by C-G formula based on SCr of 1.21 mg/dL).   Wt Readings from Last 3 Encounters:  11/29/21 218 lb (98.9 kg)  11/21/21 226 lb (102.5 kg)  10/31/21 234 lb 4 oz (106.3 kg)     Other studies reviewed: Additional studies/records reviewed today include: summarized above  ASSESSMENT AND PLAN:  PPM Intact functio, programming change as noted  CAD No anginal symptoms Sees Dr. [name] later today  HTN Looks goo, home readings also good  Stroke Old EGMs were cleared at the hospital I fine only 2 AMS episodes (though no EGMs)  05/11/21, 23mn52 seconds  04/14/21, 360m2sec    Disposition: F/u with Dr. ChGasper Sellshis afternoon, remotes as usual and in clinic 1 year, sooner if needed.  Current medicines are reviewed  at length with the patient today.  The patient did not have any concerns regarding medicines.  SiVenetia NightPA-C 11/29/2021 12:35 PM     [id number] [phone])  [phone])

## 2021-11-29 ENCOUNTER — Ambulatory Visit: Payer: Medicare Other | Admitting: Physician Assistant

## 2021-11-29 ENCOUNTER — Ambulatory Visit (INDEPENDENT_AMBULATORY_CARE_PROVIDER_SITE_OTHER): Payer: Medicare Other | Admitting: Internal Medicine

## 2021-11-29 ENCOUNTER — Other Ambulatory Visit: Payer: Self-pay

## 2021-11-29 ENCOUNTER — Encounter: Payer: Self-pay | Admitting: Physician Assistant

## 2021-11-29 ENCOUNTER — Encounter: Payer: Self-pay | Admitting: Internal Medicine

## 2021-11-29 VITALS — BP 120/60 | HR 60 | Ht 69.0 in | Wt 219.0 lb

## 2021-11-29 VITALS — BP 108/56 | HR 62 | Ht 69.0 in | Wt 218.0 lb

## 2021-11-29 DIAGNOSIS — I639 Cerebral infarction, unspecified: Secondary | ICD-10-CM | POA: Diagnosis not present

## 2021-11-29 DIAGNOSIS — Z950 Presence of cardiac pacemaker: Secondary | ICD-10-CM | POA: Diagnosis not present

## 2021-11-29 DIAGNOSIS — I2510 Atherosclerotic heart disease of native coronary artery without angina pectoris: Secondary | ICD-10-CM

## 2021-11-29 DIAGNOSIS — I700 Atherosclerosis of aorta: Secondary | ICD-10-CM | POA: Diagnosis not present

## 2021-11-29 LAB — CUP PACEART INCLINIC DEVICE CHECK
Battery Remaining Longevity: 102 mo
Battery Voltage: 3.02 V
Brady Statistic RA Percent Paced: 87 %
Brady Statistic RV Percent Paced: 0.38 %
Date Time Interrogation Session: 20230113175305
Implantable Lead Implant Date: 20220118
Implantable Lead Implant Date: 20220118
Implantable Lead Location: 753859
Implantable Lead Location: 753860
Implantable Pulse Generator Implant Date: 20220118
Lead Channel Impedance Value: 450 Ohm
Lead Channel Impedance Value: 462.5 Ohm
Lead Channel Pacing Threshold Amplitude: 1 V
Lead Channel Pacing Threshold Amplitude: 1 V
Lead Channel Pacing Threshold Amplitude: 1 V
Lead Channel Pacing Threshold Amplitude: 1 V
Lead Channel Pacing Threshold Pulse Width: 0.5 ms
Lead Channel Pacing Threshold Pulse Width: 0.5 ms
Lead Channel Pacing Threshold Pulse Width: 0.8 ms
Lead Channel Pacing Threshold Pulse Width: 0.8 ms
Lead Channel Sensing Intrinsic Amplitude: 3.7 mV
Lead Channel Sensing Intrinsic Amplitude: 8.3 mV
Lead Channel Setting Pacing Amplitude: 2 V
Lead Channel Setting Pacing Amplitude: 2.5 V
Lead Channel Setting Pacing Pulse Width: 0.5 ms
Lead Channel Setting Sensing Sensitivity: 2 mV
Pulse Gen Model: 2272
Pulse Gen Serial Number: 3891376

## 2021-11-29 NOTE — Patient Instructions (Signed)
Medication Instructions:  Your physician recommends that you continue on your current medications as directed. Please refer to the Current Medication list given to you today.  *If you need a refill on your cardiac medications before your next appointment, please call your pharmacy*   Lab Work: None If you have labs (blood work) drawn today and your tests are completely normal, you will receive your results only by: [place] (if you have MyChart) OR A paper copy in the mail If you have any lab test that is abnormal or we need to change your treatment, we will call you to review the results.   Follow-Up: At [facility], you and your health needs are our priority.  As part of our continuing mission to provide you with exceptional heart care, we have created designated Provider Care Teams.  These Care Teams include your primary Cardiologist (physician) and Advanced Practice Providers (APPs -  Physician Assistants and Nurse Practitioners) who all work together to provide you with the care you need, when you need it.   Your next appointment:   3-4 month(s)  The format for your next appointment:   In Person  Provider:   [name], MD  or APP

## 2021-11-29 NOTE — Patient Instructions (Signed)

## 2021-11-29 NOTE — Progress Notes (Signed)
Cardiology Office Note:    Date:  11/29/2021   ID:  [name], DOB Jun 06, 1956, MRN [id number]  PCP:  [name], MD  [facility] Cardiologist:  [name], MD   CC: Follow up hospitalization  History of Present Illness:    Austin Oliver is a 66 y.o. male with a hx of Sinus Bradycardia, Aortic Atherosclerosis & HLD with statin intolerance, HTN who presented 08/01/19 with unstable angina.  Shared decision making for NM Stress showed no lesion but without resolution of pain, received LCP with ostial LAD lesion s/p PCI 08/24/20.  Residual pain 08/27/20 with increase in his Imdur.  Received PPM in interval 12/04/20 SJM PPM.  Would check also scheduled for 12/18/20.In interim of this visit, patient CP had improved.  No angina at EP 03/06/21.  At last visit worsening chest pain despite maximal therapy.  Repeat LHC showed no changes and pain improved without cardiac intervention.  Seen prior to knee shoulder and had no post-op.  In rehab had new stroke in the setting of COVID-19 infection.  No evidence of Afib in limited review of PPM. Had planned TEE 11/26/21 but this was cancelled.  Seen 11/29/21.  Patient notes that he is doing is recovering from his knee. Still can't lift his left leg weakness persists.   Has started to use his inhalers.  And is trying to get his blood sugar down.  No chest pain or pressure .  No SOB/DOE and no PND/Orthopnea. No palpitations or syncope.  Ambulatory blood pressure 106/70.   Past Medical History:  Diagnosis Date   AICD (automatic cardioverter/defibrillator) present    for sick sinus syndrome   Cirrhosis of liver (HCC)    COPD (chronic obstructive pulmonary disease) ([place])    Coronary artery calcification seen on CAT scan 01/14/2017   Essential hypertension 09/11/2020   GERD (gastroesophageal reflux disease)    History of kidney stones    Hyperlipidemia    Hypothyroidism    OSA on CPAP    Mild with AHI 7.8/hr >>on CPAP    Osteoarthritis    Shingles 2012    Past Surgical History:  Procedure Laterality Date   CORONARY STENT INTERVENTION N/A 08/24/2020   Procedure: CORONARY STENT INTERVENTION;  Surgeon: [name], MD;  Location: [place] [facility];  Service: Cardiovascular;  Laterality: N/A;   HEEL SPUR SURGERY Left 80's   INTRAVASCULAR PRESSURE WIRE/FFR STUDY N/A 04/25/2021   Procedure: INTRAVASCULAR PRESSURE WIRE/FFR STUDY;  Surgeon: [name], MD;  Location: [place] [facility];  Service: Cardiovascular;  Laterality: N/A;   INTRAVASCULAR ULTRASOUND/IVUS N/A 08/24/2020   Procedure: Intravascular Ultrasound/IVUS;  Surgeon: [name], MD;  Location: [place] [facility];  Service: Cardiovascular;  Laterality: N/A;   KNEE ARTHROPLASTY Right 10/31/2021   Procedure: COMPUTER ASSISTED TOTAL KNEE ARTHROPLASTY;  Surgeon: Rod Can, MD;  Location: [facility];  Service: Orthopedics;  Laterality: Right;   KNEE SURGERY Bilateral    numerous times   LEFT HEART CATH AND CORONARY ANGIOGRAPHY N/A 08/24/2020   Procedure: LEFT HEART CATH AND CORONARY ANGIOGRAPHY;  Surgeon: [name], MD;  Location: [place] [facility];  Service: Cardiovascular;  Laterality: N/A;   LEFT HEART CATH AND CORONARY ANGIOGRAPHY N/A 04/25/2021   Procedure: LEFT HEART CATH AND CORONARY ANGIOGRAPHY;  Surgeon: [name], MD;  Location: [place] [facility];  Service: Cardiovascular;  Laterality: N/A;   NECK SURGERY  70's   PACEMAKER IMPLANT N/A 12/04/2020   Procedure: PACEMAKER IMPLANT;  Surgeon: Curt Bears,  Ocie Doyne, MD;  Location: [place] [facility];  Service: Cardiovascular;  Laterality: N/A;   TOTAL HIP ARTHROPLASTY Left 03/09/2017   Procedure: LEFT TOTAL HIP ARTHROPLASTY ANTERIOR APPROACH;  Surgeon: Rod Can, MD;  Location: [place];  Service: Orthopedics;  Laterality: Left;  Dr. requesting RNFA    Current Medications: Current Meds  Medication Sig   acetaminophen (TYLENOL) 325 MG tablet Take 1-2 tablets (325-650 mg  total) by mouth every 6 (six) hours as needed for mild pain (pain score 1-3 or temp > 100.5).   albuterol (VENTOLIN HFA) 108 (90 Base) MCG/ACT inhaler Inhale 2 puffs into the lungs every 6 (six) hours as needed for shortness of breath or wheezing.   aspirin 81 MG chewable tablet Chew 1 tablet (81 mg total) by mouth daily for 21 days.   blood glucose meter kit and supplies KIT Dispense based on patient and insurance preference. Use up to four times daily as directed.   clopidogrel (PLAVIX) 75 MG tablet Take 1 tablet (75 mg total) by mouth daily.   docusate sodium (COLACE) 100 MG capsule Take 1 capsule (100 mg total) by mouth 2 (two) times daily.   ezetimibe (ZETIA) 10 MG tablet Take 10 mg by mouth in the morning.   finasteride (PROSCAR) 5 MG tablet Take 5 mg by mouth daily.   gabapentin (NEURONTIN) 100 MG capsule Take 200 mg by mouth at bedtime.   ibuprofen (ADVIL) 200 MG tablet Take 400 mg by mouth every 8 (eight) hours as needed (inflammation/pain.).   isosorbide mononitrate (IMDUR) 120 MG 24 hr tablet Take 1 tablet (120 mg total) by mouth daily. Please hold this medication for three days, please check your blood pressure twice a day, please discuss with your pcp and cardiology regarding dose adjustment to avoid hypotension. Hold this medication if systolic blood pressure ( top number) is less than 100.   levothyroxine (SYNTHROID) 112 MCG tablet Take 112 mcg by mouth daily before breakfast.   loteprednol (LOTEMAX) 0.5 % ophthalmic suspension Place 1 drop into the left eye 2 (two) times a week. Mondays & Fridays   metFORMIN (GLUCOPHAGE) 500 MG tablet Take 1 tablet (500 mg total) by mouth 2 (two) times daily with a meal.   nitroGLYCERIN (NITROSTAT) 0.4 MG SL tablet Place 1 tablet (0.4 mg total) under the tongue every 5 (five) minutes x 3 doses as needed for chest pain.   pantoprazole (PROTONIX) 40 MG tablet Take 40 mg by mouth daily after supper.    ranolazine (RANEXA) 1000 MG SR tablet TAKE 1  BY  MOUTH TWICE DAILY   rosuvastatin (CRESTOR) 5 MG tablet TAKE 1 TABLET BY MOUTH DAILY   senna (SENOKOT) 8.6 MG TABS tablet Take 1 tablet (8.6 mg total) by mouth 2 (two) times daily.   shark liver oil-cocoa butter (PREPARATION H) 0.25-3-85.5 % suppository Place 1 suppository rectally daily as needed for hemorrhoids.   tamsulosin (FLOMAX) 0.4 MG CAPS capsule Take 0.8 mg by mouth daily with supper.   Tiotropium Bromide Monohydrate (SPIRIVA RESPIMAT) 2.5 MCG/ACT AERS Inhale 2 puffs into the lungs daily.   vitamin B-12 (CYANOCOBALAMIN) 1000 MCG tablet Take 1,000 mcg by mouth in the morning.     Allergies:   Bee venom, Cleocin [clindamycin hcl], and Statins   Social History   Socioeconomic History   Marital status: Married    Spouse name: [name]   Number of children: 2   Years of education: Not on file   Highest education level: Not on file  Occupational History   Occupation: Animator  Tobacco Use   Smoking status: Former    Packs/day: 2.00    Years: 45.00    Pack years: 90.00    Types: Cigarettes    Quit date: 12/05/2016    Years since quitting: 4.9   Smokeless tobacco: Never  Vaping Use   Vaping Use: Never used  Substance and Sexual Activity   Alcohol use: No   Drug use: No   Sexual activity: Not on file  Other Topics Concern   Not on file  Social History Narrative   Not on file   Social Determinants of Health   Financial Resource Strain: Not on file  Food Insecurity: Not on file  Transportation Needs: Not on file  Physical Activity: Not on file  Stress: Not on file  Social Connections: Not on file    Social: Comes up with his wife  Family History: The patient's family history includes COPD in his father; Dementia in his mother; Heart Problems in his father; Lung cancer in his father.  ROS:   Please see the history of present illness.     All other systems reviewed and are negative.  EKGs/Labs/Other Studies Reviewed:    The following studies were  reviewed today:  EKG:   04/04/21: Sr rate 62 1st HB Pr 202 08/24/20 marked sinus bradycardia rate 40, no ST/T changes 07/31/20 sinus bradycardia rate of 51, with possible anterior q waves OSH 07/16/20:  Sinus bradycardia rate of 48 without ST/T changes or q waves.  Transthoracic Echocardiogram: Date: 10/02/20 Results: 1. Left ventricular ejection fraction, by estimation, is 50 to 55%. The  left ventricle has low normal function. The left ventricle has no regional  wall motion abnormalities. The left ventricular internal cavity size was  mildly dilated. Left ventricular  diastolic parameters were normal.   2. Right ventricular systolic function is normal. The right ventricular  size is normal. Tricuspid regurgitation signal is inadequate for assessing  PA pressure.   3. Left atrial size was mildly dilated.   4. The mitral valve is normal in structure. No evidence of mitral valve  regurgitation.   5. The aortic valve is tricuspid. There is mild calcification of the  aortic valve. There is mild thickening of the aortic valve. Aortic valve  regurgitation is trivial. Mild aortic valve sclerosis is present, with no  evidence of aortic valve stenosis.   6. The inferior vena cava is normal in size with greater than 50%  respiratory variability, suggesting right atrial pressure of 3 mmHg.  NonCardiac CT : Date: 08/27/2020 Results: Aortic Atherosclerosis with notable LAD disease  NM Stress Testing: Date: 08/06/20 Results: NM Stress 08/06/20 Nuclear stress EF: 57%. There was no ST segment deviation noted during stress. The study is normal. This is a low risk study. The left ventricular ejection fraction is normal (55-65%).   Normal pharmacologic nuclear stress test with no evidence for prior infarct or ischemia. LVEF 57%. Left ventricle appears mildly dilated.  Left/Right Heart Catheterizations: Date:08/24/20 Results: Conclusions: Severe single-vessel coronary artery disease with  sequential 90% ostial/proximal, 40% mid, and 50% mid/distal LAD stenoses as well as 60% D3 lesion. Moderate, non-obstructive coronary artery disease involving the LCx and RCA. Normal left ventricular systolic function with upper normal filling pressure. Successful PCI to the ostial/proximal LAD using Resolute Onyx 3.0 x 12 mm drug-eluting stent (postdilated to 3.6 mm) with 0% residual stenosis and TIMI-3 flow.   Recommendations: Dual antiplatelet therapy with aspirin and ticagrelor for  12 months. Aggressive secondary prevention. Medical therapy of non-critical mid/distal LAD, LCx, and RCA disease. Anticipate same-day discharge if no post-catheterization complications occur.  Date: 04/25/21 Results:  Conclusions: Mild to moderate, non-obstructive coronary artery disease, as detailed below.  Overall appearance is similar to completion of last year's catheterization.  LAD, D3, and RCA/rPLA disease is not hemodynamically significant by RFR. Widely patent ostial/proximal LAD stent. Mildly elevated left ventricular filling pressure (LVEDP 15-20 mmHg).   Recommendations: Continue aggressive secondary prevention of coronary artery disease. Consider workup for alternative etiologies of persistent atypical chest pain and fatigue.   Recent Labs: 04/04/2021: NT-Pro BNP 46 11/21/2021: ALT 10 11/22/2021: Hemoglobin 9.5; Magnesium 1.9; Platelets 160 11/23/2021: BUN 21; Creatinine, Ser 1.21; Potassium 4.2; Sodium 134  Recent Lipid Panel    Component Value Date/Time   CHOL 66 11/21/2021 1151   CHOL 158 08/22/2020 1044   TRIG 146 11/21/2021 1151   HDL 12 (L) 11/21/2021 1151   HDL 35 (L) 08/22/2020 1044   CHOLHDL 5.5 11/21/2021 1151   VLDL 29 11/21/2021 1151   LDLCALC 25 11/21/2021 1151   LDLCALC 106 (H) 08/22/2020 1044    Physical Exam:    VS:  BP 120/60    Pulse 60    Ht 5' 9"  (1.753 m)    Wt 99.3 kg    SpO2 98%    BMI 32.34 kg/m     Wt Readings from Last 3 Encounters:  11/29/21 99.3 kg   11/29/21 98.9 kg  11/21/21 102.5 kg    Gen: No distress  Neck: No JVD Ears: bilateral Frank Sign Cardiac: No Rubs or Gallops, no Murmur, regular rhythm and +2 radial pulses Respiratory: Clear to auscultation bilaterally, normal effort, normal  respiratory rate GI: Soft, nontender, non-distended  MS: No  edema; Shuffling gate, Limited R knee mobility, decreased strength left leg Integument: Skin feels warm Neuro:  At time of evaluation, alert and oriented to person/place/time/situation  Psych: Normal affect, patient feels anxiety   ASSESSMENT:    1. Acute CVA (cerebrovascular accident) ([place])   2. Aortic atherosclerosis ([place])   3. Coronary artery disease involving native coronary artery of native heart without angina pectoris      PLAN:     Acute CVA Coronary Artery Disease; Obstructive with stable angina. COPD- now back with pulm Hyperlipidemia (mixed) with New Diabetes Prior Statin Intolerance- tolerating current rosuvastatin Cirrhosis of the liver Aortic Atherosclerosis Chronotropic incompetence s/p PPM Mixed macrocytic anemia - asymptomatic on medication - anatomy: anatomy: PCi LAD, 40% mLAD, 50% dLAD, 60% D3, < 50% RCA and LCx - continue ASA 81 mg; is back on plavix for three months secondary to stroke (we have briefly discussed the pros and cons of prolong DAPT vs risks, he has had no bleeding issues of DAPT) - continue statin and zetia; LDL is 25  - continue nitrates; Imdur 120 PO Daily - Ranexa 1000 mg - seen by lipid clinic in the past - on lasix 20 mg PO Daily  - with weight loss no longer meets criteria for morbid obesity- great work  Will Reach out to Dr. Leonie Man to clarify; presently no plans for TEE  Three to Four months me or APP      Medication Adjustments/Labs and Tests Ordered: Current medicines are reviewed at length with the patient today.  Concerns regarding medicines are outlined above.  No orders of the defined types were placed in this  encounter.   No orders of the defined types were placed in this encounter.  Patient Instructions  Medication Instructions:  Your physician recommends that you continue on your current medications as directed. Please refer to the Current Medication list given to you today.  *If you need a refill on your cardiac medications before your next appointment, please call your pharmacy*   Lab Work: None If you have labs (blood work) drawn today and your tests are completely normal, you will receive your results only by: [place] (if you have MyChart) OR A paper copy in the mail If you have any lab test that is abnormal or we need to change your treatment, we will call you to review the results.   Follow-Up: At [facility], you and your health needs are our priority.  As part of our continuing mission to provide you with exceptional heart care, we have created designated Provider Care Teams.  These Care Teams include your primary Cardiologist (physician) and Advanced Practice Providers (APPs -  Physician Assistants and Nurse Practitioners) who all work together to provide you with the care you need, when you need it.   Your next appointment:   3-4 month(s)  The format for your next appointment:   In Person  Provider:   [name], MD  or APP    Signed, [name], MD  11/29/2021 2:02 PM    [place]

## 2021-12-03 ENCOUNTER — Other Ambulatory Visit: Payer: Self-pay

## 2021-12-03 ENCOUNTER — Ambulatory Visit: Payer: Medicare Other | Attending: Internal Medicine

## 2021-12-03 DIAGNOSIS — I639 Cerebral infarction, unspecified: Secondary | ICD-10-CM | POA: Diagnosis not present

## 2021-12-03 DIAGNOSIS — R2689 Other abnormalities of gait and mobility: Secondary | ICD-10-CM | POA: Insufficient documentation

## 2021-12-03 DIAGNOSIS — M25661 Stiffness of right knee, not elsewhere classified: Secondary | ICD-10-CM | POA: Diagnosis present

## 2021-12-03 DIAGNOSIS — M6281 Muscle weakness (generalized): Secondary | ICD-10-CM | POA: Insufficient documentation

## 2021-12-03 DIAGNOSIS — R2681 Unsteadiness on feet: Secondary | ICD-10-CM | POA: Diagnosis present

## 2021-12-04 ENCOUNTER — Ambulatory Visit (INDEPENDENT_AMBULATORY_CARE_PROVIDER_SITE_OTHER): Payer: Medicare Other

## 2021-12-04 DIAGNOSIS — I495 Sick sinus syndrome: Secondary | ICD-10-CM | POA: Diagnosis not present

## 2021-12-04 LAB — CUP PACEART REMOTE DEVICE CHECK
Battery Remaining Longevity: 94 mo
Battery Remaining Percentage: 92 %
Battery Voltage: 3.02 V
Brady Statistic AP VP Percent: 1 %
Brady Statistic AP VS Percent: 91 %
Brady Statistic AS VP Percent: 1 %
Brady Statistic AS VS Percent: 7.2 %
Brady Statistic RA Percent Paced: 92 %
Brady Statistic RV Percent Paced: 1 %
Date Time Interrogation Session: 20230118020013
Implantable Lead Implant Date: 20220118
Implantable Lead Implant Date: 20220118
Implantable Lead Location: 753859
Implantable Lead Location: 753860
Implantable Pulse Generator Implant Date: 20220118
Lead Channel Impedance Value: 450 Ohm
Lead Channel Impedance Value: 490 Ohm
Lead Channel Pacing Threshold Amplitude: 1 V
Lead Channel Pacing Threshold Amplitude: 1 V
Lead Channel Pacing Threshold Pulse Width: 0.5 ms
Lead Channel Pacing Threshold Pulse Width: 0.8 ms
Lead Channel Sensing Intrinsic Amplitude: 3.4 mV
Lead Channel Sensing Intrinsic Amplitude: 7.7 mV
Lead Channel Setting Pacing Amplitude: 2 V
Lead Channel Setting Pacing Amplitude: 2.5 V
Lead Channel Setting Pacing Pulse Width: 0.5 ms
Lead Channel Setting Sensing Sensitivity: 2 mV
Pulse Gen Model: 2272
Pulse Gen Serial Number: 3891376

## 2021-12-04 NOTE — Therapy (Signed)
[facility] [address] [place], [place], [address] Phone: [phone]   Fax:  [phone]  Physical Therapy Evaluation  Patient Details  Name: Austin Oliver MRN: [id number] Date of Birth: [birth year] Referring Provider (PT): Dr. Leonie Man   Encounter Date: 12/03/2021   PT End of Session - 12/03/21 1314     Visit Number 1    Number of Visits 17    Date for PT Re-Evaluation 01/31/22    Authorization Type [employer] medicare so 10th visit progress note    PT Start Time 1314    PT Stop Time 1404    PT Time Calculation (min) 50 min    Activity Tolerance Patient tolerated treatment well    Behavior During Therapy [facility] for tasks assessed/performed             Past Medical History:  Diagnosis Date   AICD (automatic cardioverter/defibrillator) present    for sick sinus syndrome   Cirrhosis of liver (HCC)    COPD (chronic obstructive pulmonary disease) ([place])    Coronary artery calcification seen on CAT scan 01/14/2017   Essential hypertension 09/11/2020   GERD (gastroesophageal reflux disease)    History of kidney stones    Hyperlipidemia    Hypothyroidism    OSA on CPAP    Mild with AHI 7.8/hr >>on CPAP   Osteoarthritis    Shingles 2012    Past Surgical History:  Procedure Laterality Date   CORONARY STENT INTERVENTION N/A 08/24/2020   Procedure: CORONARY STENT INTERVENTION;  Surgeon: [name], MD;  Location: [place];  Service: Cardiovascular;  Laterality: N/A;   HEEL SPUR SURGERY Left 80's   INTRAVASCULAR PRESSURE WIRE/FFR STUDY N/A 04/25/2021   Procedure: INTRAVASCULAR PRESSURE WIRE/FFR STUDY;  Surgeon: [name], MD;  Location: [facility];  Service: Cardiovascular;  Laterality: N/A;   INTRAVASCULAR ULTRASOUND/IVUS N/A 08/24/2020   Procedure: Intravascular Ultrasound/IVUS;  Surgeon: [name], MD;  Location: [place];  Service: Cardiovascular;  Laterality: N/A;   KNEE  ARTHROPLASTY Right 10/31/2021   Procedure: COMPUTER ASSISTED TOTAL KNEE ARTHROPLASTY;  Surgeon: Rod Can, MD;  [facility];  Service: Orthopedics;  Laterality: Right;   KNEE SURGERY Bilateral    numerous times   LEFT HEART CATH AND CORONARY ANGIOGRAPHY N/A 08/24/2020   Procedure: LEFT HEART CATH AND CORONARY ANGIOGRAPHY;  Surgeon: [name], MD;  Location: [facility];  Service: Cardiovascular;  Laterality: N/A;   LEFT HEART CATH AND CORONARY ANGIOGRAPHY N/A 04/25/2021   Procedure: LEFT HEART CATH AND CORONARY ANGIOGRAPHY;  Surgeon: [name], MD;  Location: [facility];  Service: Cardiovascular;  Laterality: N/A;   NECK SURGERY  70's   PACEMAKER IMPLANT N/A 12/04/2020   Procedure: PACEMAKER IMPLANT;  Surgeon: [name], MD;  Location: [facility];  Service: Cardiovascular;  Laterality: N/A;   TOTAL HIP ARTHROPLASTY Left 03/09/2017   Procedure: LEFT TOTAL HIP ARTHROPLASTY ANTERIOR APPROACH;  Surgeon: Rod Can, MD;  Location: [facility];  Service: Orthopedics;  Laterality: Left;  Dr. requesting RNFA    There were no vitals filed for this visit.    Subjective Assessment - 12/03/21 1314     Subjective Pt is 66 y/o male with recent CVA 11/19/21. Pt found to have multiple scattered acute and subacute infarcts in bilateral frontal, pareital, temporal and occipital lobes. Pt had recent right RKE 10/31/21. Also had recent COVID. Pt feels that his vision has been affected some. Pt  reports more difficulty seeing TV if does not have glasses on. Sees eye doctor soon. History of shingles in left eye. Also feels hearing is not quite as good. Pt was receiving PT for right knee but the therapist wanted to hold that until we addressed weakness in LLE from CVA. Was going to [facility]. 2 falls on Sunday and right knee was really sore. Did not hit it. Pt feels that his biggest issues since CVA is weakness in LLE. Pt is walking on walker. Was not using anything  prior to knee surgery. Pt has been monitoring his vitals at home and they are doing pretty well.    Patient is accompained by: Family member   wife   Pertinent History PMH includes hypothyroidism, HLD, HTN, CAD, SSS s/p pacemaker in 11/2020, COPD, OSA on cpap. s/p right TKR 12/15.    Patient Stated Goals Pt wants to get stronger in left leg and walk without anything.    Currently in Pain? Yes    Pain Score 2     Pain Location Knee    Pain Orientation Right    Pain Descriptors / Indicators Aching;Sore    Pain Type Acute pain    Pain Onset 1 to 4 weeks ago    Pain Frequency Intermittent    Aggravating Factors  bending far and hitting the floor, overuse    Pain Relieving Factors ice, tylenol                [facility] PT Assessment - 12/03/21 1316       Assessment   Medical Diagnosis CVA    Referring Provider (PT) Dr. Leonie Man    Onset Date/Surgical Date 11/19/21    Hand Dominance Right    Prior Therapy outpatient PT for right knee      Precautions   Precautions Fall;ICD/Pacemaker      Restrictions   Weight Bearing Restrictions Yes    RLE Weight Bearing Weight bearing as tolerated      Balance Screen   Has the patient fallen in the past 6 months Yes    How many times? 4   2 prior to going to the hospital for CVA and 2 the past Sunday. Just lost his balance.   Has the patient had a decrease in activity level because of a fear of falling?  Yes    Is the patient reluctant to leave their home because of a fear of falling?  Yes      [place] Private residence    Living Arrangements Spouse/significant other    Available Help at Discharge Family    Type of [place] to enter    Entrance Stairs-Number of Steps 1    Entrance Stairs-Rails Right    Home Layout Two level    Alternate Level Stairs-Number of Steps 12    Alternate [place] - 2 wheels;Bedside commode;Cane - single point;Cane -  quad;Crutches;Grab bars - toilet;Grab bars - tub/shower;Shower seat      Prior Function   Level of Independence Independent;Independent with community mobility without device    Vocation Retired    Leisure mows yards, fish, hunt      Cognition   Overall Cognitive Status Within Functional Limits for tasks assessed      Observation/Other Assessments   Skin Integrity right knee incision closed      Observation/Other Assessments-Edema    Edema --  some swelling around right knee     Sensation   Light Touch Appears Intact      ROM / Strength   AROM / PROM / Strength AROM;PROM;Strength      AROM   AROM Assessment Site Knee    Right/Left Knee Right    Right Knee Extension -10   seated   Right Knee Flexion 106      PROM   PROM Assessment Site Knee    Right/Left Knee Right    Right Knee Extension -2   seated   Right Knee Flexion 110      Strength   Overall Strength Comments RUE WFL    Strength Assessment Site Hip;Knee;Ankle    Right/Left Hip Right;Left    Right Hip Flexion 4+/5    Right Hip ABduction 4+/5    Left Hip Flexion 2/5    Left Hip ABduction 2/5    Right/Left Knee Right;Left    Left Knee Flexion 3+/5    Left Knee Extension 4/5    Right/Left Ankle Right;Left    Right Ankle Dorsiflexion 5/5    Left Ankle Dorsiflexion 4/5      Bed Mobility   Bed Mobility Rolling Right;Rolling Left;Supine to Sit;Sit to Supine    Rolling Right Independent    Rolling Left Independent    Supine to Sit Independent    Sit to Supine Supervision/Verbal cueing      Transfers   Transfers Sit to Stand;Stand to Sit    Sit to Stand 5: Supervision;With upper extremity assist    Stand to Sit 5: Supervision;With upper extremity assist      Ambulation/Gait   Ambulation/Gait Yes    Ambulation/Gait Assistance 5: Supervision;4: Min guard    Ambulation Distance (Feet) 75 Feet    Assistive device Rolling walker    Gait Pattern Step-through pattern;Decreased step length - right;Decreased step  length - left;Decreased hip/knee flexion - left    Ambulation Surface Level;Indoor    Gait velocity 30.01 sec=0.41m/s      Standardized Balance Assessment   Standardized Balance Assessment Timed Up and Go Test      Timed Up and Go Test   TUG Normal TUG    Normal TUG (seconds) 36.95                        Objective measurements completed on examination: See above findings.                PT Education - 12/04/21 0817     Education Details PT plan of care. Pt encouraged to continue to elevate right leg when sitting and continue to ice to help with swelling. Discussed supine hip flexion and abduction for LLE as well as his current exercises for RLE. If doing squats he should perform at sink with chair behind him for safety.    Person(s) Educated Patient;Spouse    Methods Explanation;Demonstration    Comprehension Verbalized understanding              PT Short Term Goals - 12/04/21 1234       PT SHORT TERM GOAL #1   Title Pt will be independent with initial HEP for strengthening, ROM and balance.    Time 4    Period Weeks    Status New    Target Date 01/01/22      PT SHORT TERM GOAL #2   Title Pt will increase left hip flexion from 2/5 to 3+/5 or better  to assist with foot clearance with gait.    Baseline 12/03/21 2/5    Time 4    Period Weeks    Status New    Target Date 01/01/22      PT SHORT TERM GOAL #3   Title Pt will increase gait speed from 0.48m/s to >0.29m/s for improved household mobility.    Baseline 12/03/21 0.56m/s    Time 4    Period Weeks    Status New    Target Date 01/01/22      PT SHORT TERM GOAL #4   Title Merrilee Jansky will be completed and LTG updated    Time 4    Period Weeks    Status New    Target Date 01/01/22               PT Long Term Goals - 12/04/21 1237       PT LONG TERM GOAL #1   Title Pt will be independent with progressive HEP for strengthening, ROM and balance to continue gains on own.    Time 8     Period Weeks    Status New    Target Date 01/31/22      PT LONG TERM GOAL #2   Title Pt will increase gait speed to >0.68m/s for improved community mobility.    Baseline 12/03/21 0.67m/s    Time 4    Period Weeks    Status New    Target Date 01/31/22      PT LONG TERM GOAL #3   Title Pt will ambulate > 500' on varied surfaces with LRAD versus no device for improved short community distances.    Time 8    Period Weeks    Status New    Target Date 01/31/22      PT LONG TERM GOAL #4   Title Pt will decrease TUG from 36.95 sec to <25 sec for improved balance and functional mobility.    Baseline 12/03/21 36.95 sec with RW    Time 8    Period Weeks    Status New    Target Date 01/31/22      PT LONG TERM GOAL #5   Title Berg TBD    Time 8    Period Weeks    Status New    Target Date 01/31/22      Additional Long Term Goals   Additional Long Term Goals Yes      PT LONG TERM GOAL #6   Title Pt will ambulate up/down 4 steps with 1 rail mod I for improved community access.    Time 8    Period Weeks    Status New    Target Date 01/31/22      PT LONG TERM GOAL #7   Title Pt will increase right knee ROM to 120 degrees flexion and 0 degrees extension for improved mobility.    Baseline 12/03/21 A/PROM seated flexion=106/110 and extension=-10/-2    Time 8    Period Weeks    Status New    Target Date 01/31/22                    Plan - 12/04/21 0819     Clinical Impression Statement Pt is 66 y/o male with recent CVA with LLE weakness. Pt was found to have multiple scattered acute and subacute infarcts in bilateral frontal, pareital, temporal and occipital lobes in ER on 11/19/21. Also had recent COVID and right TKR on 10/31/21. Pt's  strength was impaired in LLE most notably in hip with 2/5 strength in hip flexors and abductors. Pt currently ambulating with RW at slow gait speed of 0.46m/s indicating decreased safety with household mobility. Pt has decreased balance based on  TUG of 36.95 sec indicating he is fall risk. Has also had a couple recent falls. PT also assessed right knee ROM seated with A/PROM of -10/-2 to 106/110 degrees. Pt will benefit from skilled PT to address strength, ROM, balance and functional mobility deficits.    Personal Factors and Comorbidities Comorbidity 3+    Comorbidities hypothyroidism, HLD, HTN, CAD, SSS s/p pacemaker in 11/2020, COPD, OSA on cpap. s/p right TKR 12/15.    Examination-Activity Limitations Locomotion Level;Transfers;Bathing;Squat;Stairs;Stand    Examination-Participation Restrictions Community Activity;Cleaning;Meal Prep;Yard Work    [employer]chant [employer] officer Evolving/Moderate complexity    Clinical Decision Making Moderate    Rehab Potential Good    PT Frequency 2x / week   plus eval   PT Duration 8 weeks    PT Treatment/Interventions ADLs/Self Care Home Management;Cryotherapy;Gait training;Stair training;Functional mobility training;Therapeutic activities;Therapeutic exercise;Balance training;DME Instruction;Neuromuscular re-education;Manual techniques;Passive range of motion;Scar mobilization;Vestibular;Patient/family education    PT Next Visit Plan Check vitals for baseline. Berg Balance test. Issue HEP for initial LLE strengthening (RLE as well with recent TKR but does have some things he is doing). Gait training with RW working on increasing left hip flexion. Left hip was weakest.    Consulted and Agree with Plan of Care Patient;Family member/caregiver    Family Member Consulted wife             Patient will benefit from skilled therapeutic intervention in order to improve the following deficits and impairments:  Abnormal gait, Decreased mobility, Decreased strength, Increased edema, Decreased knowledge of use of DME, Decreased balance, Decreased activity tolerance, Decreased endurance, Pain, Decreased range of motion  Visit Diagnosis: Other abnormalities of gait and mobility  Muscle weakness  (generalized)  Unsteadiness on feet  Stiffness of right knee, not elsewhere classified     Problem List Patient Active Problem List   Diagnosis Date Noted   Acute CVA (cerebrovascular accident) ([place]) 11/20/2021   Osteoarthritis of right knee 10/31/2021   OSA on CPAP 08/26/2021   DOE (dyspnea on exertion) 04/04/2021   Anemia due to vitamin B12 deficiency 04/04/2021   Statin myopathy 09/18/2020   Coronary artery disease involving native coronary artery of native heart without angina pectoris 09/11/2020   Essential hypertension 09/11/2020   Hyperlipidemia 09/11/2020   Unstable angina (HCC) 08/24/2020   Aortic atherosclerosis ([place]) 07/31/2020   Other pancytopenia ([place]) 03/29/2020   Bicytopenia 03/08/2020   Avascular necrosis of hip, left ([place]) 03/09/2017   Coronary artery disease with stable angina pectoris ([place]) 01/14/2017    Electa Sniff, PT, DPT, NCS 12/04/2021, 12:48 PM  [facility] [address] [place], [place], [address] Phone: [phone]   Fax:  [phone]  Name: WINFORD HEHN MRN: [id number] Date of Birth: 06/02/1956

## 2021-12-05 ENCOUNTER — Ambulatory Visit: Payer: Medicare Other | Admitting: Pulmonary Disease

## 2021-12-05 ENCOUNTER — Encounter: Payer: Self-pay | Admitting: Pulmonary Disease

## 2021-12-05 ENCOUNTER — Other Ambulatory Visit: Payer: Self-pay

## 2021-12-05 VITALS — BP 110/70 | HR 68 | Ht 69.0 in | Wt 221.0 lb

## 2021-12-05 DIAGNOSIS — J449 Chronic obstructive pulmonary disease, unspecified: Secondary | ICD-10-CM

## 2021-12-05 DIAGNOSIS — G4733 Obstructive sleep apnea (adult) (pediatric): Secondary | ICD-10-CM | POA: Diagnosis not present

## 2021-12-05 DIAGNOSIS — Z9989 Dependence on other enabling machines and devices: Secondary | ICD-10-CM | POA: Diagnosis not present

## 2021-12-05 MED ORDER — ANORO ELLIPTA 62.5-25 MCG/ACT IN AEPB: 1.0000 | INHALATION_SPRAY | Freq: Every day | RESPIRATORY_TRACT | 0 refills | Status: DC

## 2021-12-05 NOTE — Progress Notes (Signed)
Austin Oliver    [id number]    10/12/1956  Primary Care Physician:Elkins, Curt Jews, MD  Referring Physician: [name], MD [address] [place],  [place] [address]  Chief complaint:   Follow-up for COPD  HPI:  Was recently hospitalized for knee surgery Post COVID with possible CVA  Undergoing physical therapy Still has a lot of weakness Ambulates with a walker  Compliant with his Spiriva but finding it expensive  CT with evidence of emphysema Mild obstructive lung disease on PFT  He has obstructive sleep apnea and is compliant with CPAP use  Occasional wheezing  Was prescribed albuterol but only rarely uses it  Reformed smoker quit in 2018 was smoking 1-1/2 to 2 packs a day  Worked with [employer] in the past  Outpatient Encounter Medications as of 12/05/2021  Medication Sig   acetaminophen (TYLENOL) 325 MG tablet Take 1-2 tablets (325-650 mg total) by mouth every 6 (six) hours as needed for mild pain (pain score 1-3 or temp > 100.5).   albuterol (VENTOLIN HFA) 108 (90 Base) MCG/ACT inhaler Inhale 2 puffs into the lungs every 6 (six) hours as needed for shortness of breath or wheezing.   aspirin 81 MG chewable tablet Chew 1 tablet (81 mg total) by mouth daily for 21 days.   blood glucose meter kit and supplies KIT Dispense based on patient and insurance preference. Use up to four times daily as directed.   clopidogrel (PLAVIX) 75 MG tablet Take 1 tablet (75 mg total) by mouth daily.   docusate sodium (COLACE) 100 MG capsule Take 1 capsule (100 mg total) by mouth 2 (two) times daily.   ezetimibe (ZETIA) 10 MG tablet Take 10 mg by mouth in the morning.   finasteride (PROSCAR) 5 MG tablet Take 5 mg by mouth daily.   gabapentin (NEURONTIN) 100 MG capsule Take 200 mg by mouth at bedtime.   ibuprofen (ADVIL) 200 MG tablet Take 400 mg by mouth every 8 (eight) hours as needed (inflammation/pain.).   isosorbide mononitrate (IMDUR) 120 MG 24 hr  tablet Take 1 tablet (120 mg total) by mouth daily. Please hold this medication for three days, please check your blood pressure twice a day, please discuss with your pcp and cardiology regarding dose adjustment to avoid hypotension. Hold this medication if systolic blood pressure ( top number) is less than 100.   levothyroxine (SYNTHROID) 112 MCG tablet Take 112 mcg by mouth daily before breakfast.   loteprednol (LOTEMAX) 0.5 % ophthalmic suspension Place 1 drop into the left eye 2 (two) times a week. Mondays & Fridays   metFORMIN (GLUCOPHAGE) 500 MG tablet Take 1 tablet (500 mg total) by mouth 2 (two) times daily with a meal.   nitroGLYCERIN (NITROSTAT) 0.4 MG SL tablet Place 1 tablet (0.4 mg total) under the tongue every 5 (five) minutes x 3 doses as needed for chest pain.   pantoprazole (PROTONIX) 40 MG tablet Take 40 mg by mouth daily after supper.    psyllium (METAMUCIL) 58.6 % powder Take 1 packet by mouth 3 (three) times daily. As needed   ranolazine (RANEXA) 1000 MG SR tablet TAKE 1  BY MOUTH TWICE DAILY   rosuvastatin (CRESTOR) 5 MG tablet TAKE 1 TABLET BY MOUTH DAILY   senna (SENOKOT) 8.6 MG TABS tablet Take 1 tablet (8.6 mg total) by mouth 2 (two) times daily. (Patient taking differently: Take 1 tablet by mouth as needed.)   shark liver oil-cocoa  butter (PREPARATION H) 0.25-3-85.5 % suppository Place 1 suppository rectally daily as needed for hemorrhoids.   tamsulosin (FLOMAX) 0.4 MG CAPS capsule Take 0.8 mg by mouth daily with supper.   Tiotropium Bromide Monohydrate (SPIRIVA RESPIMAT) 2.5 MCG/ACT AERS Inhale 2 puffs into the lungs daily.   vitamin B-12 (CYANOCOBALAMIN) 1000 MCG tablet Take 1,000 mcg by mouth in the morning.   No facility-administered encounter medications on file as of 12/05/2021.    Allergies as of 12/05/2021 - Review Complete 12/05/2021  Allergen Reaction Noted   Bee venom Swelling 03/03/2017   Cleocin [clindamycin hcl] Rash 02/27/2017   Statins Other (See  Comments) 08/24/2020    Past Medical History:  Diagnosis Date   AICD (automatic cardioverter/defibrillator) present    for sick sinus syndrome   Cirrhosis of liver (HCC)    COPD (chronic obstructive pulmonary disease) ([place])    Coronary artery calcification seen on CAT scan 01/14/2017   Essential hypertension 09/11/2020   GERD (gastroesophageal reflux disease)    History of kidney stones    Hyperlipidemia    Hypothyroidism    OSA on CPAP    Mild with AHI 7.8/hr >>on CPAP   Osteoarthritis    Shingles 2012    Past Surgical History:  Procedure Laterality Date   CORONARY STENT INTERVENTION N/A 08/24/2020   Procedure: CORONARY STENT INTERVENTION;  Surgeon: [name], MD;  Location: [facility];  Service: Cardiovascular;  Laterality: N/A;   HEEL SPUR SURGERY Left 80's   INTRAVASCULAR PRESSURE WIRE/FFR STUDY N/A 04/25/2021   Procedure: INTRAVASCULAR PRESSURE WIRE/FFR STUDY;  Surgeon: [name], MD;  Location: [facility];  Service: Cardiovascular;  Laterality: N/A;   INTRAVASCULAR ULTRASOUND/IVUS N/A 08/24/2020   Procedure: Intravascular Ultrasound/IVUS;  Surgeon: [name], MD;  Location: [facility];  Service: Cardiovascular;  Laterality: N/A;   KNEE ARTHROPLASTY Right 10/31/2021   Procedure: COMPUTER ASSISTED TOTAL KNEE ARTHROPLASTY;  Surgeon: Rod Can, MD;  [facility];  Service: Orthopedics;  Laterality: Right;   KNEE SURGERY Bilateral    numerous times   LEFT HEART CATH AND CORONARY ANGIOGRAPHY N/A 08/24/2020   Procedure: LEFT HEART CATH AND CORONARY ANGIOGRAPHY;  Surgeon: [name], MD;  Location: [facility];  Service: Cardiovascular;  Laterality: N/A;   LEFT HEART CATH AND CORONARY ANGIOGRAPHY N/A 04/25/2021   Procedure: LEFT HEART CATH AND CORONARY ANGIOGRAPHY;  Surgeon: [name], MD;  Location: [facility];  Service: Cardiovascular;  Laterality: N/A;   NECK SURGERY  70's   PACEMAKER IMPLANT N/A  12/04/2020   Procedure: PACEMAKER IMPLANT;  Surgeon: [name], MD;  Location: [facility];  Service: Cardiovascular;  Laterality: N/A;   TOTAL HIP ARTHROPLASTY Left 03/09/2017   Procedure: LEFT TOTAL HIP ARTHROPLASTY ANTERIOR APPROACH;  Surgeon: Rod Can, MD;  Location: [facility];  Service: Orthopedics;  Laterality: Left;  Dr. requesting RNFA    Family History  Problem Relation Age of Onset   Dementia Mother    Heart Problems Father    COPD Father    Lung cancer Father     Social History   Socioeconomic History   Marital status: Married    Spouse name: [name]   Number of children: 2   Years of education: Not on file   Highest education level: Not on file  Occupational History   Occupation: Animator  Tobacco Use   Smoking status: Former    Packs/day: 2.00    Years: 45.00  Pack years: 90.00    Types: Cigarettes    Quit date: 12/05/2016    Years since quitting: 5.0   Smokeless tobacco: Never  Vaping Use   Vaping Use: Never used  Substance and Sexual Activity   Alcohol use: No   Drug use: No   Sexual activity: Not on file  Other Topics Concern   Not on file  Social History Narrative   Not on file   Social Determinants of Health   Financial Resource Strain: Not on file  Food Insecurity: Not on file  Transportation Needs: Not on file  Physical Activity: Not on file  Stress: Not on file  Social Connections: Not on file  Intimate Partner Violence: Not on file    Review of Systems  Respiratory:  Positive for shortness of breath and wheezing.    Vitals:   12/05/21 1342  BP: 110/70  Pulse: 68  SpO2: 98%     Physical Exam Constitutional:      Appearance: He is obese.  HENT:     Head: Normocephalic.     Mouth/Throat:     Mouth: Mucous membranes are moist.  Eyes:     Pupils: Pupils are equal, round, and reactive to light.  Cardiovascular:     Rate and Rhythm: Normal rate and regular rhythm.     Heart sounds: No murmur  heard.   No friction rub.  Pulmonary:     Effort: No respiratory distress.     Breath sounds: No stridor. No wheezing or rhonchi.     Comments: Decreased air movement bilaterally Musculoskeletal:     Cervical back: No rigidity or tenderness.  Neurological:     Mental Status: He is alert.  Psychiatric:        Mood and Affect: Mood normal.     Data Reviewed: Recent PFT with scooping of the flow volume loop suggestive of an obstructive disease  CT scan of the chest 08/27/2020 with evidence of emphysema  Assessment:  Mild chronic obstructive pulmonary disease -Continue on Spiriva  Shortness of breath on exertion -Multifactorial reasons  Significant deconditioning from recent health problems including CVA, knee surgery  Coronary artery disease  Obstructive sleep apnea -Encouraged to continue CPAP on a regular basis  Dyspnea on exertion   Plan/Recommendations: Spiriva daily  Albuterol use as needed  Continue CPAP  Graded exercises as tolerated  Follow-up in 6 months  Encouraged to call with any significant concerns   Step up weight loss efforts  Follow up in 3 months   Sherrilyn Rist MD  Pulmonary and Critical Care 12/05/2021, 1:48 PM  CC: [name], *

## 2021-12-05 NOTE — Patient Instructions (Signed)
Continue using your inhaler on a regular basis  There is no generic version of Spiriva  Medication we can change it to is called Anoro which comes in a generic form  -You may want to ask the pharmacist whether this will be cheaper for you and then we can make the change  Continue using your CPAP on a nightly basis  Be very cautious with respect to physical activity to avoid falls as able  I will see you in about 6 months

## 2021-12-05 NOTE — Addendum Note (Signed)
Addended by: Dessie Coma on: 12/05/2021 05:33 PM   Modules accepted: Orders

## 2021-12-10 ENCOUNTER — Ambulatory Visit: Payer: Medicare Other

## 2021-12-10 ENCOUNTER — Other Ambulatory Visit: Payer: Self-pay

## 2021-12-10 VITALS — BP 107/56 | HR 60

## 2021-12-10 DIAGNOSIS — R2681 Unsteadiness on feet: Secondary | ICD-10-CM

## 2021-12-10 DIAGNOSIS — R2689 Other abnormalities of gait and mobility: Secondary | ICD-10-CM | POA: Diagnosis not present

## 2021-12-10 DIAGNOSIS — M6281 Muscle weakness (generalized): Secondary | ICD-10-CM

## 2021-12-10 DIAGNOSIS — M25661 Stiffness of right knee, not elsewhere classified: Secondary | ICD-10-CM

## 2021-12-10 NOTE — Therapy (Signed)
[facility] [address] [place], [place], [address] Phone: [phone]   Fax:  [phone]  Physical Therapy Treatment  Patient Details  Name: Austin Oliver MRN: [id number] Date of Birth: [birth year] Referring Provider (PT): Dr. Leonie Man (referral was Dr. Florencia Reasons (hospitalist))   Encounter Date: 12/10/2021   PT End of Session - 12/10/21 1235     Visit Number 2    Number of Visits 17    Date for PT Re-Evaluation 01/31/22    Authorization Type [employer] medicare so 10th visit progress note    PT Start Time 1146    PT Stop Time 1230    PT Time Calculation (min) 44 min    Activity Tolerance Patient tolerated treatment well    Behavior During Therapy [facility] for tasks assessed/performed             Past Medical History:  Diagnosis Date   AICD (automatic cardioverter/defibrillator) present    for sick sinus syndrome   Cirrhosis of liver ([place])    COPD (chronic obstructive pulmonary disease) ([place])    Coronary artery calcification seen on CAT scan 01/14/2017   Essential hypertension 09/11/2020   GERD (gastroesophageal reflux disease)    History of kidney stones    Hyperlipidemia    Hypothyroidism    OSA on CPAP    Mild with AHI 7.8/hr >>on CPAP   Osteoarthritis    Shingles 2012    Past Surgical History:  Procedure Laterality Date   CORONARY STENT INTERVENTION N/A 08/24/2020   Procedure: CORONARY STENT INTERVENTION;  Surgeon: [name], MD;  Location: [facility];  Service: Cardiovascular;  Laterality: N/A;   HEEL SPUR SURGERY Left 80's   INTRAVASCULAR PRESSURE WIRE/FFR STUDY N/A 04/25/2021   Procedure: INTRAVASCULAR PRESSURE WIRE/FFR STUDY;  Surgeon: [name], MD;  Location: [facility];  Service: Cardiovascular;  Laterality: N/A;   INTRAVASCULAR ULTRASOUND/IVUS N/A 08/24/2020   Procedure: Intravascular Ultrasound/IVUS;  Surgeon: [name], MD;  Location: [facility];  Service:  Cardiovascular;  Laterality: N/A;   KNEE ARTHROPLASTY Right 10/31/2021   Procedure: COMPUTER ASSISTED TOTAL KNEE ARTHROPLASTY;  Surgeon: Rod Can, MD;  Location: [facility];  Service: Orthopedics;  Laterality: Right;   KNEE SURGERY Bilateral    numerous times   LEFT HEART CATH AND CORONARY ANGIOGRAPHY N/A 08/24/2020   Procedure: LEFT HEART CATH AND CORONARY ANGIOGRAPHY;  Surgeon: [name], MD;  Location: [facility];  Service: Cardiovascular;  Laterality: N/A;   LEFT HEART CATH AND CORONARY ANGIOGRAPHY N/A 04/25/2021   Procedure: LEFT HEART CATH AND CORONARY ANGIOGRAPHY;  Surgeon: [name], MD;  Location: [facility];  Service: Cardiovascular;  Laterality: N/A;   NECK SURGERY  70's   PACEMAKER IMPLANT N/A 12/04/2020   Procedure: PACEMAKER IMPLANT;  Surgeon: [name], MD;  Location: [facility];  Service: Cardiovascular;  Laterality: N/A;   TOTAL HIP ARTHROPLASTY Left 03/09/2017   Procedure: LEFT TOTAL HIP ARTHROPLASTY ANTERIOR APPROACH;  Surgeon: Rod Can, MD;  Location: [facility];  Service: Orthopedics;  Laterality: Left;  Dr. requesting RNFA    Vitals:   12/10/21 1154  BP: (!) 107/56  Pulse: 60     Subjective Assessment - 12/10/21 1150     Subjective Patient reports one fall onto his Right side the day after being here last visit. No other new changes/complaints.    Patient is accompained by: Family member   wife   Pertinent History PMH  includes hypothyroidism, HLD, HTN, CAD, SSS s/p pacemaker in 11/2020, COPD, OSA on cpap. s/p right TKR 12/15.    Patient Stated Goals Pt wants to get stronger in left leg and walk without anything.    Currently in Pain? Yes    Pain Score 3     Pain Location Knee    Pain Orientation Right    Pain Descriptors / Indicators Aching;Sore    Pain Type Acute pain    Pain Onset 1 to 4 weeks ago    Pain Frequency Intermittent    Aggravating Factors  overuse    Pain Relieving Factors tylenol                [facility] PT Assessment - 12/10/21 0001       Standardized Balance Assessment   Standardized Balance Assessment Berg Balance Test      Berg Balance Test   Sit to Stand Able to stand  independently using hands    Standing Unsupported Able to stand safely 2 minutes    Sitting with Back Unsupported but Feet Supported on Floor or Stool Able to sit safely and securely 2 minutes    Stand to Sit Controls descent by using hands    Transfers Able to transfer safely, definite need of hands    Standing Unsupported with Eyes Closed Able to stand 10 seconds safely    Standing Unsupported with Feet Together Able to place feet together independently and stand for 1 minute with supervision    From Standing, Reach Forward with Outstretched Arm Can reach forward >12 cm safely (5")   9"   From Standing Position, Pick up Object from Floor Able to pick up shoe, needs supervision    From Standing Position, Turn to Look Behind Over each Shoulder Looks behind one side only/other side shows less weight shift    Turn 360 Degrees Needs close supervision or verbal cueing    Standing Unsupported, Alternately Place Feet on Step/Stool Able to complete >2 steps/needs minimal assist    Standing Unsupported, One Foot in Front Needs help to step but can hold 15 seconds    Standing on One Leg Able to lift leg independently and hold equal to or more than 3 seconds    Total Score 38    Berg comment: 38/56              [facility] Adult PT Treatment/Exercise - 12/10/21 0001       Transfers   Transfers Sit to Stand;Stand to Sit    Sit to Stand 5: Supervision;With upper extremity assist    Stand to Sit 5: Supervision;With upper extremity assist      Ambulation/Gait   Ambulation/Gait Yes    Ambulation/Gait Assistance 5: Supervision;4: Min guard    Ambulation/Gait Assistance Details ambulation into/out of therapy gym with RW    Ambulation Distance (Feet) --   clinic distance   Assistive device Rolling walker    Gait  Pattern Step-through pattern;Decreased step length - right;Decreased step length - left;Decreased hip/knee flexion - left    Ambulation Surface Level;Indoor            Established Initial HEP. Patient asking questions regarding prior HEP and what exercises are still beneficial. PT educating to bring into next session and can review. PT also educating on ice application and elevation to R knee due to increased swelling.   Access Code: [email or web address] URL: [email or web address] Date: 12/10/2021 Prepared by: Baldomero Lamy  Exercises Supine March with Resistance Band -  1 x daily - 7 x weekly - 2 sets - 10 reps Seated Hamstring Curl with Anchored Resistance - 1 x daily - 7 x weekly - 2 sets - 10 reps         PT Education - 12/10/21 1235     Education Details Initial HEP; Bring in prior HEP for review    Person(s) Educated Patient;Spouse    Methods Explanation;Demonstration;Handout    Comprehension Verbalized understanding;Returned demonstration              PT Short Term Goals - 12/04/21 1234       PT SHORT TERM GOAL #1   Title Pt will be independent with initial HEP for strengthening, ROM and balance.    Time 4    Period Weeks    Status New    Target Date 01/01/22      PT SHORT TERM GOAL #2   Title Pt will increase left hip flexion from 2/5 to 3+/5 or better to assist with foot clearance with gait.    Baseline 12/03/21 2/5    Time 4    Period Weeks    Status New    Target Date 01/01/22      PT SHORT TERM GOAL #3   Title Pt will increase gait speed from 0.38m/s to >0.59m/s for improved household mobility.    Baseline 12/03/21 0.76m/s    Time 4    Period Weeks    Status New    Target Date 01/01/22      PT SHORT TERM GOAL #4   Title Merrilee Jansky will be completed and LTG updated    Time 4    Period Weeks    Status New    Target Date 01/01/22               PT Long Term Goals - 12/04/21 1237       PT LONG TERM GOAL #1   Title Pt will be  independent with progressive HEP for strengthening, ROM and balance to continue gains on own.    Time 8    Period Weeks    Status New    Target Date 01/31/22      PT LONG TERM GOAL #2   Title Pt will increase gait speed to >0.63m/s for improved community mobility.    Baseline 12/03/21 0.52m/s    Time 4    Period Weeks    Status New    Target Date 01/31/22      PT LONG TERM GOAL #3   Title Pt will ambulate > 500' on varied surfaces with LRAD versus no device for improved short community distances.    Time 8    Period Weeks    Status New    Target Date 01/31/22      PT LONG TERM GOAL #4   Title Pt will decrease TUG from 36.95 sec to <25 sec for improved balance and functional mobility.    Baseline 12/03/21 36.95 sec with RW    Time 8    Period Weeks    Status New    Target Date 01/31/22      PT LONG TERM GOAL #5   Title Berg TBD    Time 8    Period Weeks    Status New    Target Date 01/31/22      Additional Long Term Goals   Additional Long Term Goals Yes      PT LONG TERM GOAL #6   Title Pt  will ambulate up/down 4 steps with 1 rail mod I for improved community access.    Time 8    Period Weeks    Status New    Target Date 01/31/22      PT LONG TERM GOAL #7   Title Pt will increase right knee ROM to 120 degrees flexion and 0 degrees extension for improved mobility.    Baseline 12/03/21 A/PROM seated flexion=106/110 and extension=-10/-2    Time 8    Period Weeks    Status New    Target Date 01/31/22      PT LONG TERM GOAL #8   Title FOTO will increase from 65 to 77.    Baseline 12/03/21 65    Time 8    Status New    Target Date 01/31/22                   Plan - 12/10/21 1240     Clinical Impression Statement Completed balance assesment today with Berg Balance Test, patietn scoring 38/56 indicating high fall risk. Most challenge noted with SLS activties and 360 deg turns. Patient continue to have increased swelling in the R Knee, PT educating on ice  application/elevation. Rest of session spent establishing HEP, plan to review prior HEP at next session. Will continue to progress toward all LTGs.    Personal Factors and Comorbidities Comorbidity 3+    Comorbidities hypothyroidism, HLD, HTN, CAD, SSS s/p pacemaker in 11/2020, COPD, OSA on cpap. s/p right TKR 12/15.    Examination-Activity Limitations Locomotion Level;Transfers;Bathing;Squat;Stairs;Stand    Examination-Participation Restrictions Community Activity;Cleaning;Meal Prep;Yard Work    [employer] Evolving/Moderate complexity    Rehab Potential Good    PT Frequency 2x / week   plus eval   PT Duration 8 weeks    PT Treatment/Interventions ADLs/Self Care Home Management;Cryotherapy;Gait training;Stair training;Functional mobility training;Therapeutic activities;Therapeutic exercise;Balance training;DME Instruction;Neuromuscular re-education;Manual techniques;Passive range of motion;Scar mobilization;Vestibular;Patient/family education    PT Next Visit Plan review HEP for initial LLE strengthening (RLE as well with recent TKR but does have some things he is doing). Gait training with RW working on increasing left hip flexion. Left hip was weakest.    Consulted and Agree with Plan of Care Patient;Family member/caregiver    Family Member Consulted wife             Patient will benefit from skilled therapeutic intervention in order to improve the following deficits and impairments:  Abnormal gait, Decreased mobility, Decreased strength, Increased edema, Decreased knowledge of use of DME, Decreased balance, Decreased activity tolerance, Decreased endurance, Pain, Decreased range of motion  Visit Diagnosis: Other abnormalities of gait and mobility  Muscle weakness (generalized)  Unsteadiness on feet  Stiffness of right knee, not elsewhere classified     Problem List Patient Active Problem List   Diagnosis Date Noted   Acute CVA (cerebrovascular accident)  ([place]) 11/20/2021   Osteoarthritis of right knee 10/31/2021   OSA on CPAP 08/26/2021   DOE (dyspnea on exertion) 04/04/2021   Anemia due to vitamin B12 deficiency 04/04/2021   Statin myopathy 09/18/2020   Coronary artery disease involving native coronary artery of native heart without angina pectoris 09/11/2020   Essential hypertension 09/11/2020   Hyperlipidemia 09/11/2020   Unstable angina ([place]) 08/24/2020   Aortic atherosclerosis ([place]) 07/31/2020   Other pancytopenia ([place]) 03/29/2020   Bicytopenia 03/08/2020   Avascular necrosis of hip, left ([place]) 03/09/2017   Coronary artery disease with stable angina pectoris ([place]) 01/14/2017    Nikhita Mentzel  Verdie Drown, PT, DPT 12/10/2021, 12:42 PM  [facility] [address] [place], [place], [address] Phone: [phone]   Fax:  [phone]  Name: Austin Oliver MRN: [id number] Date of Birth: 21-Dec-1955

## 2021-12-10 NOTE — Patient Instructions (Signed)
Access Code: [email or web address] URL: [email or web address] Date: 12/10/2021 Prepared by: Baldomero Lamy  Exercises Supine March with Resistance Band - 1 x daily - 7 x weekly - 2 sets - 10 reps Seated Hamstring Curl with Anchored Resistance - 1 x daily - 7 x weekly - 2 sets - 10 reps

## 2021-12-17 ENCOUNTER — Ambulatory Visit: Payer: Medicare Other

## 2021-12-17 NOTE — Progress Notes (Signed)
Remote pacemaker transmission.   

## 2021-12-19 ENCOUNTER — Ambulatory Visit: Payer: Medicare Other | Attending: Family Medicine

## 2021-12-19 ENCOUNTER — Other Ambulatory Visit: Payer: Self-pay

## 2021-12-19 VITALS — BP 120/68

## 2021-12-19 DIAGNOSIS — R2681 Unsteadiness on feet: Secondary | ICD-10-CM | POA: Insufficient documentation

## 2021-12-19 DIAGNOSIS — M25661 Stiffness of right knee, not elsewhere classified: Secondary | ICD-10-CM | POA: Diagnosis present

## 2021-12-19 DIAGNOSIS — M6281 Muscle weakness (generalized): Secondary | ICD-10-CM | POA: Insufficient documentation

## 2021-12-19 DIAGNOSIS — R2689 Other abnormalities of gait and mobility: Secondary | ICD-10-CM | POA: Insufficient documentation

## 2021-12-19 NOTE — Patient Instructions (Signed)
Access Code: [email or web address] URL: [email or web address] Date: 12/19/2021 Prepared by: Cherly Anderson  Exercises Supine March with Resistance Band - 1 x daily - 7 x weekly - 2 sets - 10 reps Seated Hamstring Curl with Anchored Resistance - 1 x daily - 7 x weekly - 2 sets - 10 reps Supine Bridge - 2 x daily - 7 x weekly - 2 sets - 10 reps Clamshell with Resistance - 2 x daily - 7 x weekly - 2 sets - 10 reps

## 2021-12-19 NOTE — Therapy (Signed)
[facility] [address] [place], [place], [address] Phone: [phone]   Fax:  [phone]  Physical Therapy Treatment  Patient Details  Name: Austin Oliver MRN: [id number] Date of Birth: [birth year] Referring Provider (PT): Dr. Leonie Man (referral was Dr. Florencia Reasons (hospitalist))   Encounter Date: 12/19/2021   PT End of Session - 12/19/21 1151     Visit Number 3    Number of Visits 17    Date for PT Re-Evaluation 01/31/22    Authorization Type [employer] medicare so 10th visit progress note    PT Start Time 1147    PT Stop Time 1232    PT Time Calculation (min) 45 min    Equipment Utilized During Treatment Gait belt    Activity Tolerance Patient tolerated treatment well    Behavior During Therapy [facility] for tasks assessed/performed             Past Medical History:  Diagnosis Date   AICD (automatic cardioverter/defibrillator) present    for sick sinus syndrome   Cirrhosis of liver (HCC)    COPD (chronic obstructive pulmonary disease) ([place])    Coronary artery calcification seen on CAT scan 01/14/2017   Essential hypertension 09/11/2020   GERD (gastroesophageal reflux disease)    History of kidney stones    Hyperlipidemia    Hypothyroidism    OSA on CPAP    Mild with AHI 7.8/hr >>on CPAP   Osteoarthritis    Shingles 2012    Past Surgical History:  Procedure Laterality Date   CORONARY STENT INTERVENTION N/A 08/24/2020   Procedure: CORONARY STENT INTERVENTION;  Surgeon: [name], MD;  Location: [facility];  Service: Cardiovascular;  Laterality: N/A;   HEEL SPUR SURGERY Left 80's   INTRAVASCULAR PRESSURE WIRE/FFR STUDY N/A 04/25/2021   Procedure: INTRAVASCULAR PRESSURE WIRE/FFR STUDY;  Surgeon: [name], MD;  Location: [facility];  Service: Cardiovascular;  Laterality: N/A;   INTRAVASCULAR ULTRASOUND/IVUS N/A 08/24/2020   Procedure: Intravascular Ultrasound/IVUS;  Surgeon: [name],  MD;  Location: [facility];  Service: Cardiovascular;  Laterality: N/A;   KNEE ARTHROPLASTY Right 10/31/2021   Procedure: COMPUTER ASSISTED TOTAL KNEE ARTHROPLASTY;  Surgeon: Rod Can, MD;  [facility];  Service: Orthopedics;  Laterality: Right;   KNEE SURGERY Bilateral    numerous times   LEFT HEART CATH AND CORONARY ANGIOGRAPHY N/A 08/24/2020   Procedure: LEFT HEART CATH AND CORONARY ANGIOGRAPHY;  Surgeon: [name], MD;  Location: [facility];  Service: Cardiovascular;  Laterality: N/A;   LEFT HEART CATH AND CORONARY ANGIOGRAPHY N/A 04/25/2021   Procedure: LEFT HEART CATH AND CORONARY ANGIOGRAPHY;  Surgeon: [name], MD;  Location: [facility];  Service: Cardiovascular;  Laterality: N/A;   NECK SURGERY  70's   PACEMAKER IMPLANT N/A 12/04/2020   Procedure: PACEMAKER IMPLANT;  Surgeon: [name], MD;  Location: [facility];  Service: Cardiovascular;  Laterality: N/A;   TOTAL HIP ARTHROPLASTY Left 03/09/2017   Procedure: LEFT TOTAL HIP ARTHROPLASTY ANTERIOR APPROACH;  Surgeon: Rod Can, MD;  Location: [facility];  Service: Orthopedics;  Laterality: Left;  Dr. requesting RNFA    Vitals:   12/19/21 1151  BP: 120/68     Subjective Assessment - 12/19/21 1151     Subjective Pt reports that he had a stomach bug earlier this week so was not feeling well. He reports that he is doing better with clearing left leg and was noted improved  clearance walking in. He reports he did not do much the last few days but has found that squats are getting easier as well.    Patient is accompained by: Family member   wife   Pertinent History PMH includes hypothyroidism, HLD, HTN, CAD, SSS s/p pacemaker in 11/2020, COPD, OSA on cpap. s/p right TKR 12/15.    Patient Stated Goals Pt wants to get stronger in left leg and walk without anything.    Currently in Pain? Yes    Pain Score 4     Pain Location Knee    Pain Orientation Right    Pain Descriptors  / Indicators Sore    Pain Type Acute pain    Pain Onset 1 to 4 weeks ago    Pain Frequency Intermittent    Aggravating Factors  overuse, if bends a lot                               [facility] Adult PT Treatment/Exercise - 12/19/21 1153       Transfers   Transfers Sit to Stand;Stand to Sit    Sit to Stand 5: Supervision;With upper extremity assist    Stand to Sit 5: Supervision      Ambulation/Gait   Ambulation/Gait Yes    Ambulation/Gait Assistance 5: Supervision    Ambulation/Gait Assistance Details between activities in session    Assistive device Rolling walker    Gait Pattern Step-through pattern;Decreased hip/knee flexion - left;Decreased step length - left    Ambulation Surface Level;Indoor      Neuro Re-ed    Neuro Re-ed Details  Sit to stand with 2" step under right foot to increase left weight shift x 10. At bottom of steps, tapping bottom step with LLE x 10 with cues to increase foot clearance especially when  removing, alternating taps x 10, step-ups with LLE x 10 with tactile cues at knee to not lock out and control. 1 episode of buckling of left knee needing min/mod assist ti regain. Step-ups with RLE x 10.      Exercises   Exercises Other Exercises    Other Exercises  Seated left hip flexion x 10. On mat supine: with right knee bent performed left SLR x 10 with cues to engage core, marching with red theraband x 10 each leg. Sidelying for left clamshell with red theraband with tactile and verbal cues for form x 10. Bridges x 10 with cues to engage core and keep hips level. PT updated pt's exercises looking at what he was doing at home and highlighted 3 to focus on besides our exercises (SLR, LAQ and squats at counter with chair behind)                     PT Education - 12/19/21 1224     Education Details Added bridge and clamshell. Pt is also performing sheets from therapy for right knee including supine SLR, quad sets, LAQ, squats at  counter.    Person(s) Educated Patient;Spouse    Methods Explanation;Tactile cues;Handout    Comprehension Verbalized understanding;Returned demonstration              PT Short Term Goals - 12/04/21 1234       PT SHORT TERM GOAL #1   Title Pt will be independent with initial HEP for strengthening, ROM and balance.    Time 4    Period Weeks    Status  New    Target Date 01/01/22      PT SHORT TERM GOAL #2   Title Pt will increase left hip flexion from 2/5 to 3+/5 or better to assist with foot clearance with gait.    Baseline 12/03/21 2/5    Time 4    Period Weeks    Status New    Target Date 01/01/22      PT SHORT TERM GOAL #3   Title Pt will increase gait speed from 0.53m/s to >0.8m/s for improved household mobility.    Baseline 12/03/21 0.34m/s    Time 4    Period Weeks    Status New    Target Date 01/01/22      PT SHORT TERM GOAL #4   Title Merrilee Jansky will be completed and LTG updated    Time 4    Period Weeks    Status New    Target Date 01/01/22               PT Long Term Goals - 12/04/21 1237       PT LONG TERM GOAL #1   Title Pt will be independent with progressive HEP for strengthening, ROM and balance to continue gains on own.    Time 8    Period Weeks    Status New    Target Date 01/31/22      PT LONG TERM GOAL #2   Title Pt will increase gait speed to >0.50m/s for improved community mobility.    Baseline 12/03/21 0.54m/s    Time 4    Period Weeks    Status New    Target Date 01/31/22      PT LONG TERM GOAL #3   Title Pt will ambulate > 500' on varied surfaces with LRAD versus no device for improved short community distances.    Time 8    Period Weeks    Status New    Target Date 01/31/22      PT LONG TERM GOAL #4   Title Pt will decrease TUG from 36.95 sec to <25 sec for improved balance and functional mobility.    Baseline 12/03/21 36.95 sec with RW    Time 8    Period Weeks    Status New    Target Date 01/31/22      PT LONG TERM GOAL  #5   Title Berg TBD    Time 8    Period Weeks    Status New    Target Date 01/31/22      Additional Long Term Goals   Additional Long Term Goals Yes      PT LONG TERM GOAL #6   Title Pt will ambulate up/down 4 steps with 1 rail mod I for improved community access.    Time 8    Period Weeks    Status New    Target Date 01/31/22      PT LONG TERM GOAL #7   Title Pt will increase right knee ROM to 120 degrees flexion and 0 degrees extension for improved mobility.    Baseline 12/03/21 A/PROM seated flexion=106/110 and extension=-10/-2    Time 8    Period Weeks    Status New    Target Date 01/31/22      PT LONG TERM GOAL #8   Title FOTO will increase from 65 to 77.    Baseline 12/03/21 65    Time 8    Status New    Target Date 01/31/22  Plan - 12/19/21 1255     Clinical Impression Statement Pt was demonstrating improved left hip flexion with better clearance with gait today as well as able to perform seated hip flexion. Did have 1 episode of left knee buckling with lowering on step. Updated HEP to make more doable to try to perform 2-3x/day    Personal Factors and Comorbidities Comorbidity 3+    Comorbidities hypothyroidism, HLD, HTN, CAD, SSS s/p pacemaker in 11/2020, COPD, OSA on cpap. s/p right TKR 12/15.    Examination-Activity Limitations Locomotion Level;Transfers;Bathing;Squat;Stairs;Stand    Examination-Participation Restrictions Community Activity;Cleaning;Meal Prep;Yard Work    [employer]chant [employer] officer Evolving/Moderate complexity    Rehab Potential Good    PT Frequency 2x / week   plus eval   PT Duration 8 weeks    PT Treatment/Interventions ADLs/Self Care Home Management;Cryotherapy;Gait training;Stair training;Functional mobility training;Therapeutic activities;Therapeutic exercise;Balance training;DME Instruction;Neuromuscular re-education;Manual techniques;Passive range of motion;Scar mobilization;Vestibular;Patient/family  education    PT Next Visit Plan How is updated HEP goin?  Gait training with RW working on increasing left hip flexion. Continue with step-ups with LLE for functional strengthening watching for buckling. Start balance training.    Consulted and Agree with Plan of Care Patient;Family member/caregiver    Family Member Consulted wife             Patient will benefit from skilled therapeutic intervention in order to improve the following deficits and impairments:  Abnormal gait, Decreased mobility, Decreased strength, Increased edema, Decreased knowledge of use of DME, Decreased balance, Decreased activity tolerance, Decreased endurance, Pain, Decreased range of motion  Visit Diagnosis: Other abnormalities of gait and mobility  Muscle weakness (generalized)     Problem List Patient Active Problem List   Diagnosis Date Noted   Acute CVA (cerebrovascular accident) ([place]) 11/20/2021   Osteoarthritis of right knee 10/31/2021   OSA on CPAP 08/26/2021   DOE (dyspnea on exertion) 04/04/2021   Anemia due to vitamin B12 deficiency 04/04/2021   Statin myopathy 09/18/2020   Coronary artery disease involving native coronary artery of native heart without angina pectoris 09/11/2020   Essential hypertension 09/11/2020   Hyperlipidemia 09/11/2020   Unstable angina (HCC) 08/24/2020   Aortic atherosclerosis ([place]) 07/31/2020   Other pancytopenia ([place]) 03/29/2020   Bicytopenia 03/08/2020   Avascular necrosis of hip, left ([place]) 03/09/2017   Coronary artery disease with stable angina pectoris ([place]) 01/14/2017    Electa Sniff, PT, DPT, NCS 12/19/2021, 12:57 PM  [facility] [address] [place], [place], [address] Phone: [phone]   Fax:  [phone]  Name: KENNEDY BOHANON MRN: [id number] Date of Birth: 1955/12/02

## 2021-12-24 ENCOUNTER — Other Ambulatory Visit: Payer: Self-pay

## 2021-12-24 ENCOUNTER — Ambulatory Visit: Payer: Medicare Other

## 2021-12-24 DIAGNOSIS — M6281 Muscle weakness (generalized): Secondary | ICD-10-CM

## 2021-12-24 DIAGNOSIS — R2681 Unsteadiness on feet: Secondary | ICD-10-CM

## 2021-12-24 DIAGNOSIS — R2689 Other abnormalities of gait and mobility: Secondary | ICD-10-CM | POA: Diagnosis not present

## 2021-12-24 DIAGNOSIS — M25661 Stiffness of right knee, not elsewhere classified: Secondary | ICD-10-CM

## 2021-12-24 NOTE — Therapy (Signed)
[facility] [address] [place], [place], [address] Phone: [phone]   Fax:  [phone]  Physical Therapy Treatment  Patient Details  Name: Austin Oliver MRN: [id number] Date of Birth: 02-20-1956 Referring Provider (PT): Dr. Leonie Man (referral was Dr. Florencia Reasons (hospitalist))   Encounter Date: 12/24/2021   PT End of Session - 12/24/21 1059     Visit Number 4    Number of Visits 17    Date for PT Re-Evaluation 01/31/22    Authorization Type UHC medicare so 10th visit progress note    PT Start Time 1100    PT Stop Time 1145    PT Time Calculation (min) 45 min    Equipment Utilized During Treatment Gait belt    Activity Tolerance Patient tolerated treatment well    Behavior During Therapy WFL for tasks assessed/performed             Past Medical History:  Diagnosis Date   AICD (automatic cardioverter/defibrillator) present    for sick sinus syndrome   Cirrhosis of liver (HCC)    COPD (chronic obstructive pulmonary disease) ([place])    Coronary artery calcification seen on CAT scan 01/14/2017   Essential hypertension 09/11/2020   GERD (gastroesophageal reflux disease)    History of kidney stones    Hyperlipidemia    Hypothyroidism    OSA on CPAP    Mild with AHI 7.8/hr >>on CPAP   Osteoarthritis    Shingles 2012    Past Surgical History:  Procedure Laterality Date   CORONARY STENT INTERVENTION N/A 08/24/2020   Procedure: CORONARY STENT INTERVENTION;  Surgeon: [name], MD;  Location: [facility];  Service: Cardiovascular;  Laterality: N/A;   HEEL SPUR SURGERY Left 80's   INTRAVASCULAR PRESSURE WIRE/FFR STUDY N/A 04/25/2021   Procedure: INTRAVASCULAR PRESSURE WIRE/FFR STUDY;  Surgeon: [name], MD;  Location: [facility];  Service: Cardiovascular;  Laterality: N/A;   INTRAVASCULAR ULTRASOUND/IVUS N/A 08/24/2020   Procedure: Intravascular Ultrasound/IVUS;  Surgeon: [name],  MD;  Location: [facility];  Service: Cardiovascular;  Laterality: N/A;   KNEE ARTHROPLASTY Right 10/31/2021   Procedure: COMPUTER ASSISTED TOTAL KNEE ARTHROPLASTY;  Surgeon: Rod Can, MD;  [facility];  Service: Orthopedics;  Laterality: Right;   KNEE SURGERY Bilateral    numerous times   LEFT HEART CATH AND CORONARY ANGIOGRAPHY N/A 08/24/2020   Procedure: LEFT HEART CATH AND CORONARY ANGIOGRAPHY;  Surgeon: [name], MD;  Location: [facility];  Service: Cardiovascular;  Laterality: N/A;   LEFT HEART CATH AND CORONARY ANGIOGRAPHY N/A 04/25/2021   Procedure: LEFT HEART CATH AND CORONARY ANGIOGRAPHY;  Surgeon: [name], MD;  Location: [facility];  Service: Cardiovascular;  Laterality: N/A;   NECK SURGERY  70's   PACEMAKER IMPLANT N/A 12/04/2020   Procedure: PACEMAKER IMPLANT;  Surgeon: [name], MD;  Location: [facility];  Service: Cardiovascular;  Laterality: N/A;   TOTAL HIP ARTHROPLASTY Left 03/09/2017   Procedure: LEFT TOTAL HIP ARTHROPLASTY ANTERIOR APPROACH;  Surgeon: Rod Can, MD;  Location: [facility];  Service: Orthopedics;  Laterality: Left;  Dr. requesting RNFA    There were no vitals filed for this visit.   Subjective Assessment - 12/24/21 1100     Subjective Patient reports after last session when the knee buckled it caused some pain in the R Knee. Reports it had some swelling and pain associated in the knee. Reports some mild pain on  the R knee. Reports continue to try to elevate it and place ice on the R knee. No other new changes/complaints.    Patient is accompained by: Family member   wife   Pertinent History PMH includes hypothyroidism, HLD, HTN, CAD, SSS s/p pacemaker in 11/2020, COPD, OSA on cpap. s/p right TKR 12/15.    Patient Stated Goals Pt wants to get stronger in left leg and walk without anything.    Currently in Pain? Yes    Pain Score 3     Pain Location Knee    Pain Orientation Right    Pain  Descriptors / Indicators Aching    Pain Type Acute pain    Pain Onset In the past 7 days    Pain Frequency Intermittent               [facility] Adult PT Treatment/Exercise - 12/24/21 0001       Transfers   Transfers Sit to Stand;Stand to Sit    Sit to Stand 5: Supervision;With upper extremity assist    Stand to Sit 5: Supervision      Ambulation/Gait   Ambulation/Gait Yes    Ambulation/Gait Assistance 5: Supervision    Ambulation/Gait Assistance Details Completed ambulation with RW x 230 ft working on improved step length bilaterally, and improved hip/knee flexion on LLE. Pt have intermittent locking of knee, patient reports he does this to avoid buckling. PT educating on avoidance of this.    Ambulation Distance (Feet) 230 Feet    Assistive device Rolling walker    Gait Pattern Step-through pattern;Decreased hip/knee flexion - left;Decreased step length - left    Ambulation Surface Level;Indoor    Gait Comments PT provided tennis balls for RW to promote improved propulsion.      Neuro Re-ed    Neuro Re-ed Details  Sit to stand with 2" step under right foot to increase left weight shift x 10. Then with target placed in front of RLE completed steps fwd/back with RLE 2 x 10 reps to promote weight shift onto LLE, all completed without UE support. At counter, worked on stepping over obstacle (black balance beam), x 4 laps down and back. At stairs: completed step-ups with LLE x 10 with tactile cues at knee to not lock out and control. No episode of buckling noted during session.      Exercises   Exercises Knee/Hip      Knee/Hip Exercises: Aerobic   Nustep Completed NuStep with BLE/BUE on Level 5 x 5 minutes, for first 2.5 minutes used UE, last 2.5 minutes did not use UE support only pushing with BLE. Patient tolerating well.                PT Short Term Goals - 12/04/21 1234       PT SHORT TERM GOAL #1   Title Pt will be independent with initial HEP for strengthening, ROM and  balance.    Time 4    Period Weeks    Status New    Target Date 01/01/22      PT SHORT TERM GOAL #2   Title Pt will increase left hip flexion from 2/5 to 3+/5 or better to assist with foot clearance with gait.    Baseline 12/03/21 2/5    Time 4    Period Weeks    Status New    Target Date 01/01/22      PT SHORT TERM GOAL #3   Title Pt will increase gait speed from  0.90m/s to >0.69m/s for improved household mobility.    Baseline 12/03/21 0.77m/s    Time 4    Period Weeks    Status New    Target Date 01/01/22      PT SHORT TERM GOAL #4   Title Merrilee Jansky will be completed and LTG updated    Time 4    Period Weeks    Status New    Target Date 01/01/22               PT Long Term Goals - 12/04/21 1237       PT LONG TERM GOAL #1   Title Pt will be independent with progressive HEP for strengthening, ROM and balance to continue gains on own.    Time 8    Period Weeks    Status New    Target Date 01/31/22      PT LONG TERM GOAL #2   Title Pt will increase gait speed to >0.62m/s for improved community mobility.    Baseline 12/03/21 0.3m/s    Time 4    Period Weeks    Status New    Target Date 01/31/22      PT LONG TERM GOAL #3   Title Pt will ambulate > 500' on varied surfaces with LRAD versus no device for improved short community distances.    Time 8    Period Weeks    Status New    Target Date 01/31/22      PT LONG TERM GOAL #4   Title Pt will decrease TUG from 36.95 sec to <25 sec for improved balance and functional mobility.    Baseline 12/03/21 36.95 sec with RW    Time 8    Period Weeks    Status New    Target Date 01/31/22      PT LONG TERM GOAL #5   Title Berg TBD    Time 8    Period Weeks    Status New    Target Date 01/31/22      Additional Long Term Goals   Additional Long Term Goals Yes      PT LONG TERM GOAL #6   Title Pt will ambulate up/down 4 steps with 1 rail mod I for improved community access.    Time 8    Period Weeks    Status New     Target Date 01/31/22      PT LONG TERM GOAL #7   Title Pt will increase right knee ROM to 120 degrees flexion and 0 degrees extension for improved mobility.    Baseline 12/03/21 A/PROM seated flexion=106/110 and extension=-10/-2    Time 8    Period Weeks    Status New    Target Date 01/31/22      PT LONG TERM GOAL #8   Title FOTO will increase from 65 to 77.    Baseline 12/03/21 65    Time 8    Status New    Target Date 01/31/22                   Plan - 12/24/21 1247     Clinical Impression Statement Continued gait training working on improved clearance with LLE and hip/knee flexion. As well as continued activities promoting weight shift/stance on LLE and negotiating over obstacles. No episode of buckling noted today during session. Will continue per POC.    Personal Factors and Comorbidities Comorbidity 3+    Comorbidities hypothyroidism, HLD, HTN, CAD, SSS  s/p pacemaker in 11/2020, COPD, OSA on cpap. s/p right TKR 12/15.    Examination-Activity Limitations Locomotion Level;Transfers;Bathing;Squat;Stairs;Stand    Examination-Participation Restrictions Community Activity;Cleaning;Meal Prep;Yard Work    Merchant navy officer Evolving/Moderate complexity    Rehab Potential Good    PT Frequency 2x / week   plus eval   PT Duration 8 weeks    PT Treatment/Interventions ADLs/Self Care Home Management;Cryotherapy;Gait training;Stair training;Functional mobility training;Therapeutic activities;Therapeutic exercise;Balance training;DME Instruction;Neuromuscular re-education;Manual techniques;Passive range of motion;Scar mobilization;Vestibular;Patient/family education    PT Next Visit Plan Continue gait training with RW working on increasing left hip flexion. Continue with step-ups with LLE for functional strengthening watching for buckling. Standing Balance Training/Weight Shift.    Consulted and Agree with Plan of Care Patient;Family member/caregiver    Family Member  Consulted wife             Patient will benefit from skilled therapeutic intervention in order to improve the following deficits and impairments:  Abnormal gait, Decreased mobility, Decreased strength, Increased edema, Decreased knowledge of use of DME, Decreased balance, Decreased activity tolerance, Decreased endurance, Pain, Decreased range of motion  Visit Diagnosis: Other abnormalities of gait and mobility  Muscle weakness (generalized)  Unsteadiness on feet  Stiffness of right knee, not elsewhere classified     Problem List Patient Active Problem List   Diagnosis Date Noted   Acute CVA (cerebrovascular accident) ([place]) 11/20/2021   Osteoarthritis of right knee 10/31/2021   OSA on CPAP 08/26/2021   DOE (dyspnea on exertion) 04/04/2021   Anemia due to vitamin B12 deficiency 04/04/2021   Statin myopathy 09/18/2020   Coronary artery disease involving native coronary artery of native heart without angina pectoris 09/11/2020   Essential hypertension 09/11/2020   Hyperlipidemia 09/11/2020   Unstable angina (HCC) 08/24/2020   Aortic atherosclerosis ([place]) 07/31/2020   Other pancytopenia ([place]) 03/29/2020   Bicytopenia 03/08/2020   Avascular necrosis of hip, left ([place]) 03/09/2017   Coronary artery disease with stable angina pectoris ([place]) 01/14/2017    Jones Bales, PT, DPT 12/24/2021, 12:56 PM  [facility] [address] [place], [place], [address] Phone: [phone]   Fax:  [phone]  Name: Austin Oliver MRN: [id number] Date of Birth: 1956-04-03

## 2021-12-26 ENCOUNTER — Ambulatory Visit: Payer: Medicare Other

## 2021-12-26 ENCOUNTER — Other Ambulatory Visit: Payer: Self-pay

## 2021-12-26 DIAGNOSIS — R2689 Other abnormalities of gait and mobility: Secondary | ICD-10-CM | POA: Diagnosis not present

## 2021-12-26 DIAGNOSIS — R2681 Unsteadiness on feet: Secondary | ICD-10-CM

## 2021-12-26 DIAGNOSIS — M25661 Stiffness of right knee, not elsewhere classified: Secondary | ICD-10-CM

## 2021-12-26 DIAGNOSIS — M6281 Muscle weakness (generalized): Secondary | ICD-10-CM

## 2021-12-26 NOTE — Therapy (Signed)
[facility] [address] [place], [place], [address] Phone: [phone]   Fax:  [phone]  Physical Therapy Treatment  Patient Details  Name: Austin Oliver MRN: [id number] Date of Birth: 1956/01/08 Referring Provider (PT): Dr. Leonie Man (referral was Dr. Florencia Reasons (hospitalist))   Encounter Date: 12/26/2021   PT End of Session - 12/26/21 1105     Visit Number 5    Number of Visits 17    Date for PT Re-Evaluation 01/31/22    Authorization Type [employer] medicare so 10th visit progress note    PT Start Time 1101    PT Stop Time 1143    PT Time Calculation (min) 42 min    Equipment Utilized During Treatment Gait belt    Activity Tolerance Patient tolerated treatment well    Behavior During Therapy WFL for tasks assessed/performed             Past Medical History:  Diagnosis Date   AICD (automatic cardioverter/defibrillator) present    for sick sinus syndrome   Cirrhosis of liver ([place])    COPD (chronic obstructive pulmonary disease) ([place])    Coronary artery calcification seen on CAT scan 01/14/2017   Essential hypertension 09/11/2020   GERD (gastroesophageal reflux disease)    History of kidney stones    Hyperlipidemia    Hypothyroidism    OSA on CPAP    Mild with AHI 7.8/hr >>on CPAP   Osteoarthritis    Shingles 2012    Past Surgical History:  Procedure Laterality Date   CORONARY STENT INTERVENTION N/A 08/24/2020   Procedure: CORONARY STENT INTERVENTION;  Surgeon: [name], MD;  Location: [facility];  Service: Cardiovascular;  Laterality: N/A;   HEEL SPUR SURGERY Left 80's   INTRAVASCULAR PRESSURE WIRE/FFR STUDY N/A 04/25/2021   Procedure: INTRAVASCULAR PRESSURE WIRE/FFR STUDY;  Surgeon: [name], MD;  Location: [facility];  Service: Cardiovascular;  Laterality: N/A;   INTRAVASCULAR ULTRASOUND/IVUS N/A 08/24/2020   Procedure: Intravascular Ultrasound/IVUS;  Surgeon: [name],  MD;  Location: [facility];  Service: Cardiovascular;  Laterality: N/A;   KNEE ARTHROPLASTY Right 10/31/2021   Procedure: COMPUTER ASSISTED TOTAL KNEE ARTHROPLASTY;  Surgeon: Rod Can, MD;  Location: [facility];  Service: Orthopedics;  Laterality: Right;   KNEE SURGERY Bilateral    numerous times   LEFT HEART CATH AND CORONARY ANGIOGRAPHY N/A 08/24/2020   Procedure: LEFT HEART CATH AND CORONARY ANGIOGRAPHY;  Surgeon: [name], MD;  Location: [facility];  Service: Cardiovascular;  Laterality: N/A;   LEFT HEART CATH AND CORONARY ANGIOGRAPHY N/A 04/25/2021   Procedure: LEFT HEART CATH AND CORONARY ANGIOGRAPHY;  Surgeon: [name], MD;  Location: [facility];  Service: Cardiovascular;  Laterality: N/A;   NECK SURGERY  70's   PACEMAKER IMPLANT N/A 12/04/2020   Procedure: PACEMAKER IMPLANT;  Surgeon: [name], MD;  Location: [facility];  Service: Cardiovascular;  Laterality: N/A;   TOTAL HIP ARTHROPLASTY Left 03/09/2017   Procedure: LEFT TOTAL HIP ARTHROPLASTY ANTERIOR APPROACH;  Surgeon: Rod Can, MD;  Location: [facility];  Service: Orthopedics;  Laterality: Left;  Dr. requesting RNFA    There were no vitals filed for this visit.   Subjective Assessment - 12/26/21 1104     Subjective Patient reports the legs feel weak today. Reports the R Knee felt the best it has this morning, pain is low today. No falls. Reports has had 1-2 episodes of buckling.  Patient is accompained by: Family member   wife   Pertinent History PMH includes hypothyroidism, HLD, HTN, CAD, SSS s/p pacemaker in 11/2020, COPD, OSA on cpap. s/p right TKR 12/15.    Patient Stated Goals Pt wants to get stronger in left leg and walk without anything.    Currently in Pain? Yes    Pain Score 2     Pain Location Knee    Pain Orientation Right    Pain Descriptors / Indicators Aching    Pain Type Acute pain                [facility] Adult PT Treatment/Exercise - 12/26/21  0001       Ambulation/Gait   Ambulation/Gait Yes    Ambulation/Gait Assistance 5: Supervision    Ambulation/Gait Assistance Details completed ambulation with RW throughout session    Assistive device Rolling walker    Gait Pattern Step-through pattern;Decreased hip/knee flexion - left;Decreased step length - left    Ambulation Surface Level;Indoor      Neuro Re-ed    Neuro Re-ed Details  In // bars: completed staggered stance without UE support working on ant/post (diagonal) weight shift x 10 reps bilat, then transitioned to standing with single leg on step with stance leg on solid surface, 2 x 30 seconds, then progressed to stance leg on complaint surface 2 x 30 seconds, Then with romberg stance on complaint completed standing EO x 30 seconds, then added in horizontal/vertical head turns x 10 reps each directions. more challenge noted with vertical > horizontal.      Exercises   Exercises Knee/Hip;Other Exercises    Other Exercises  standing with UE support completed alternating marching x 10 reps bilat, cues for improved hip/knee flexion, and standing tall through stance leg.      Knee/Hip Exercises: Aerobic   Nustep Completed NuStep for warmup with BLE only on Level 5 x 5 minutes. Patient tolerating well, continue to report improvements in stiffness with completion.      Knee/Hip Exercises: Supine   Straight Leg Raises AROM;Strengthening;Both;1 set;10 reps;Limitations    Straight Leg Raises Limitations cues to keep knee straight      Knee/Hip Exercises: Sidelying   Hip ABduction AROM;Strengthening;Both;10 reps;Limitations    Hip ABduction Limitations cues to keep knees straight                 PT Short Term Goals - 12/04/21 1234       PT SHORT TERM GOAL #1   Title Pt will be independent with initial HEP for strengthening, ROM and balance.    Time 4    Period Weeks    Status New    Target Date 01/01/22      PT SHORT TERM GOAL #2   Title Pt will increase left hip  flexion from 2/5 to 3+/5 or better to assist with foot clearance with gait.    Baseline 12/03/21 2/5    Time 4    Period Weeks    Status New    Target Date 01/01/22      PT SHORT TERM GOAL #3   Title Pt will increase gait speed from 0.52m/s to >0.77m/s for improved household mobility.    Baseline 12/03/21 0.15m/s    Time 4    Period Weeks    Status New    Target Date 01/01/22      PT SHORT TERM GOAL #4   Title Merrilee Jansky will be completed and LTG updated    Time  4    Period Weeks    Status New    Target Date 01/01/22               PT Long Term Goals - 12/04/21 1237       PT LONG TERM GOAL #1   Title Pt will be independent with progressive HEP for strengthening, ROM and balance to continue gains on own.    Time 8    Period Weeks    Status New    Target Date 01/31/22      PT LONG TERM GOAL #2   Title Pt will increase gait speed to >0.18m/s for improved community mobility.    Baseline 12/03/21 0.21m/s    Time 4    Period Weeks    Status New    Target Date 01/31/22      PT LONG TERM GOAL #3   Title Pt will ambulate > 500' on varied surfaces with LRAD versus no device for improved short community distances.    Time 8    Period Weeks    Status New    Target Date 01/31/22      PT LONG TERM GOAL #4   Title Pt will decrease TUG from 36.95 sec to <25 sec for improved balance and functional mobility.    Baseline 12/03/21 36.95 sec with RW    Time 8    Period Weeks    Status New    Target Date 01/31/22      PT LONG TERM GOAL #5   Title Berg TBD    Time 8    Period Weeks    Status New    Target Date 01/31/22      Additional Long Term Goals   Additional Long Term Goals Yes      PT LONG TERM GOAL #6   Title Pt will ambulate up/down 4 steps with 1 rail mod I for improved community access.    Time 8    Period Weeks    Status New    Target Date 01/31/22      PT LONG TERM GOAL #7   Title Pt will increase right knee ROM to 120 degrees flexion and 0 degrees extension  for improved mobility.    Baseline 12/03/21 A/PROM seated flexion=106/110 and extension=-10/-2    Time 8    Period Weeks    Status New    Target Date 01/31/22      PT LONG TERM GOAL #8   Title FOTO will increase from 65 to 77.    Baseline 12/03/21 65    Time 8    Status New    Target Date 01/31/22                   Plan - 12/26/21 1159     Clinical Impression Statement Pt reporting improvements in stiffness after completion of Nustep last session, therefore continued to incorporate this today. Continued activities focused on supine/standing strengthening, and continued standing balance exercises. Increased challenge noted with SLS/complaint surfaces. Will continue per POC.    Personal Factors and Comorbidities Comorbidity 3+    Comorbidities hypothyroidism, HLD, HTN, CAD, SSS s/p pacemaker in 11/2020, COPD, OSA on cpap. s/p right TKR 12/15.    Examination-Activity Limitations Locomotion Level;Transfers;Bathing;Squat;Stairs;Stand    Examination-Participation Restrictions Community Activity;Cleaning;Meal Prep;Yard Work    Stability/Clinical Decision Making Evolving/Moderate complexity    Rehab Potential Good    PT Frequency 2x / week   plus eval   PT Duration  8 weeks    PT Treatment/Interventions ADLs/Self Care Home Management;Cryotherapy;Gait training;Stair training;Functional mobility training;Therapeutic activities;Therapeutic exercise;Balance training;DME Instruction;Neuromuscular re-education;Manual techniques;Passive range of motion;Scar mobilization;Vestibular;Patient/family education    PT Next Visit Plan STGs Due Next Week. Nustep warmup or cool down. Continue gait training with RW working on increasing left hip flexion. Continue with step-ups with LLE for functional strengthening watching for buckling. Standing Balance Training/Weight Shift.    Consulted and Agree with Plan of Care Patient;Family member/caregiver    Family Member Consulted wife             Patient  will benefit from skilled therapeutic intervention in order to improve the following deficits and impairments:  Abnormal gait, Decreased mobility, Decreased strength, Increased edema, Decreased knowledge of use of DME, Decreased balance, Decreased activity tolerance, Decreased endurance, Pain, Decreased range of motion  Visit Diagnosis: Other abnormalities of gait and mobility  Muscle weakness (generalized)  Unsteadiness on feet  Stiffness of right knee, not elsewhere classified     Problem List Patient Active Problem List   Diagnosis Date Noted   Acute CVA (cerebrovascular accident) ([facility]) 11/20/2021   Osteoarthritis of right knee 10/31/2021   OSA on CPAP 08/26/2021   DOE (dyspnea on exertion) 04/04/2021   Anemia due to vitamin B12 deficiency 04/04/2021   Statin myopathy 09/18/2020   Coronary artery disease involving native coronary artery of native heart without angina pectoris 09/11/2020   Essential hypertension 09/11/2020   Hyperlipidemia 09/11/2020   Unstable angina (HCC) 08/24/2020   Aortic atherosclerosis ([facility]) 07/31/2020   Other pancytopenia ([place]) 03/29/2020   Bicytopenia 03/08/2020   Avascular necrosis of hip, left ([place]) 03/09/2017   Coronary artery disease with stable angina pectoris ([facility]) 01/14/2017    Jones Bales, PT, DPT 12/26/2021, 12:19 PM  [facility] [address] [place], [place], [address] Phone: [phone]   Fax:  [phone]  Name: Austin Oliver MRN: [id number] Date of Birth: Jun 11, 1956

## 2021-12-27 ENCOUNTER — Other Ambulatory Visit: Payer: Self-pay | Admitting: *Deleted

## 2021-12-27 MED ORDER — CLOPIDOGREL BISULFATE 75 MG PO TABS: 75.0000 mg | ORAL_TABLET | Freq: Every day | ORAL | 1 refills | Status: DC

## 2021-12-27 MED ORDER — ROSUVASTATIN CALCIUM 5 MG PO TABS
5.0000 mg | ORAL_TABLET | Freq: Every day | ORAL | 1 refills | Status: AC
Start: 2021-12-27 — End: ?

## 2021-12-30 ENCOUNTER — Other Ambulatory Visit (HOSPITAL_COMMUNITY): Payer: Self-pay

## 2021-12-31 ENCOUNTER — Ambulatory Visit: Payer: Medicare Other

## 2021-12-31 ENCOUNTER — Other Ambulatory Visit: Payer: Self-pay

## 2021-12-31 DIAGNOSIS — R2689 Other abnormalities of gait and mobility: Secondary | ICD-10-CM

## 2021-12-31 DIAGNOSIS — M6281 Muscle weakness (generalized): Secondary | ICD-10-CM

## 2021-12-31 NOTE — Therapy (Signed)
[facility] [address] [place], [place], [address] Phone: [phone]   Fax:  [phone]  Physical Therapy Treatment  Patient Details  Name: Austin Oliver MRN: [id number] Date of Birth: 05-Mar-1956 Referring Provider (PT): Dr. Leonie Man (referral was Dr. Florencia Reasons (hospitalist))   Encounter Date: 12/31/2021   PT End of Session - 12/31/21 1106     Visit Number 6    Number of Visits 17    Date for PT Re-Evaluation 01/31/22    Authorization Type UHC medicare so 10th visit progress note    PT Start Time 1100    PT Stop Time 1141    PT Time Calculation (min) 41 min    Equipment Utilized During Treatment Gait belt    Activity Tolerance Patient tolerated treatment well    Behavior During Therapy WFL for tasks assessed/performed             Past Medical History:  Diagnosis Date   AICD (automatic cardioverter/defibrillator) present    for sick sinus syndrome   Cirrhosis of liver ([facility])    COPD (chronic obstructive pulmonary disease) ([place])    Coronary artery calcification seen on CAT scan 01/14/2017   Essential hypertension 09/11/2020   GERD (gastroesophageal reflux disease)    History of kidney stones    Hyperlipidemia    Hypothyroidism    OSA on CPAP    Mild with AHI 7.8/hr >>on CPAP   Osteoarthritis    Shingles 2012    Past Surgical History:  Procedure Laterality Date   CORONARY STENT INTERVENTION N/A 08/24/2020   Procedure: CORONARY STENT INTERVENTION;  Surgeon: [name], MD;  Location: [facility];  Service: Cardiovascular;  Laterality: N/A;   HEEL SPUR SURGERY Left 80's   INTRAVASCULAR PRESSURE WIRE/FFR STUDY N/A 04/25/2021   Procedure: INTRAVASCULAR PRESSURE WIRE/FFR STUDY;  Surgeon: [name], MD;  Location: [facility];  Service: Cardiovascular;  Laterality: N/A;   INTRAVASCULAR ULTRASOUND/IVUS N/A 08/24/2020   Procedure: Intravascular Ultrasound/IVUS;  Surgeon: [name], MD;  Location: [facility];  Service: Cardiovascular;  Laterality: N/A;   KNEE ARTHROPLASTY Right 10/31/2021   Procedure: COMPUTER ASSISTED TOTAL KNEE ARTHROPLASTY;  Surgeon: Rod Can, MD;  [facility];  Service: Orthopedics;  Laterality: Right;   KNEE SURGERY Bilateral    numerous times   LEFT HEART CATH AND CORONARY ANGIOGRAPHY N/A 08/24/2020   Procedure: LEFT HEART CATH AND CORONARY ANGIOGRAPHY;  Surgeon: [name], MD;  Location: [facility];  Service: Cardiovascular;  Laterality: N/A;   LEFT HEART CATH AND CORONARY ANGIOGRAPHY N/A 04/25/2021   Procedure: LEFT HEART CATH AND CORONARY ANGIOGRAPHY;  Surgeon: [name], MD;  Location: [facility];  Service: Cardiovascular;  Laterality: N/A;   NECK SURGERY  70's   PACEMAKER IMPLANT N/A 12/04/2020   Procedure: PACEMAKER IMPLANT;  Surgeon: [name], MD;  Location: [facility];  Service: Cardiovascular;  Laterality: N/A;   TOTAL HIP ARTHROPLASTY Left 03/09/2017   Procedure: LEFT TOTAL HIP ARTHROPLASTY ANTERIOR APPROACH;  Surgeon: Rod Can, MD;  Location: [facility];  Service: Orthopedics;  Laterality: Left;  Dr. requesting RNFA    There were no vitals filed for this visit.   Subjective Assessment - 12/31/21 1106     Subjective Pt reports that last night he was putting on sock on right foot bringing his foot up on other leg and felt like knee went out of track. Has had pain since. Called  ortho and going this afternoon.    Patient is accompained by: Family member   wife   Pertinent History PMH includes hypothyroidism, HLD, HTN, CAD, SSS s/p pacemaker in 11/2020, COPD, OSA on cpap. s/p right TKR 12/15.    Patient Stated Goals Pt wants to get stronger in left leg and walk without anything.    Currently in Pain? Yes    Pain Score 2     Pain Location Knee    Pain Orientation Right    Pain Descriptors / Indicators Aching;Sharp    Pain Type Acute pain    Pain Onset Yesterday     Pain Frequency Intermittent    Aggravating Factors  going to stand or sit    Pain Relieving Factors tylenol                               [facility] Adult PT Treatment/Exercise - 12/31/21 1108       Transfers   Transfers Sit to Stand;Stand to Sit    Sit to Stand 5: Supervision;With upper extremity assist    Stand to Sit 5: Supervision    Stand to Sit Details (indicate cue type and reason) Verbal cues for technique    Stand to Sit Details Pt was cued to kick out right knee some due to pain when going to sit.      Ambulation/Gait   Ambulation/Gait Yes    Ambulation/Gait Assistance 5: Supervision    Ambulation/Gait Assistance Details Verbal cues to stay up in walker and continue to work on left heel strike.    Ambulation Distance (Feet) 230 Feet    Assistive device Rolling walker    Gait Pattern Step-through pattern;Decreased hip/knee flexion - right    Ambulation Surface Level    Gait velocity 41.87 sec=0.79ms      Exercises   Exercises Other Exercises    Other Exercises  Supine: left SLR 10 x 3 with verbal cues to keep knee straight, left hip flexion with manual resistance 10 x 2 with ues to control descent as well. Modified left single leg bridge with RLE resting on bolster 10 x 2 with cues to engage core.                     PT Education - 12/31/21 1203     Education Details Results of testing.    Person(s) Educated Patient;Spouse    Methods Explanation    Comprehension Verbalized understanding              PT Short Term Goals - 12/31/21 1112       PT SHORT TERM GOAL #1   Title Pt will be independent with initial HEP for strengthening, ROM and balance.    Baseline 12/31/21 Pt reports that he has been doing his exercises as prescribed.    Time 4    Period Weeks    Status Achieved    Target Date 01/01/22      PT SHORT TERM GOAL #2   Title Pt will increase left hip flexion from 2/5 to 3+/5 or better to assist with foot clearance  with gait.    Baseline 12/03/21 2/5. 12/31/21 3+/5 left hip flexion    Time 4    Period Weeks    Status Achieved    Target Date 01/01/22      PT SHORT TERM GOAL #3   Title Pt will increase gait speed from 0.390m  to >0.75ms for improved household mobility.    Baseline 12/03/21 0.361m. 12/31/21 0.2925m   Time 4    Period Weeks    Status Not Met    Target Date 01/01/22      PT SHORT TERM GOAL #4   Title BerMerrilee Janskyll be completed and LTG updated    Baseline BerMerrilee Janskys completed    Time 4    Period Weeks    Status Achieved    Target Date 01/01/22               PT Long Term Goals - 12/31/21 1204       PT LONG TERM GOAL #1   Title Pt will be independent with progressive HEP for strengthening, ROM and balance to continue gains on own.    Time 8    Period Weeks    Status New    Target Date 01/31/22      PT LONG TERM GOAL #2   Title Pt will increase gait speed to >0.54m/34mor improved community mobility.    Baseline 12/03/21 0.44m/32m Time 4    Period Weeks    Status New    Target Date 01/31/22      PT LONG TERM GOAL #3   Title Pt will ambulate > 500' on varied surfaces with LRAD versus no device for improved short community distances.    Time 8    Period Weeks    Status New    Target Date 01/31/22      PT LONG TERM GOAL #4   Title Pt will decrease TUG from 36.95 sec to <25 sec for improved balance and functional mobility.    Baseline 12/03/21 36.95 sec with RW    Time 8    Period Weeks    Status New    Target Date 01/31/22      PT LONG TERM GOAL #5   Title Pt will increase Berg from 38/56 to >45/56 for improved balance and decreased fall risk.    Baseline 12/10/21 38/56    Time 8    Period Weeks    Status Revised    Target Date 01/31/22      PT LONG TERM GOAL #6   Title Pt will ambulate up/down 4 steps with 1 rail mod I for improved community access.    Time 8    Period Weeks    Status New    Target Date 01/31/22      PT LONG TERM GOAL #7   Title Pt will  increase right knee ROM to 120 degrees flexion and 0 degrees extension for improved mobility.    Baseline 12/03/21 A/PROM seated flexion=106/110 and extension=-10/-2    Time 8    Period Weeks    Status New    Target Date 01/31/22      PT LONG TERM GOAL #8   Title FOTO will increase from 65 to 77.    Baseline 12/03/21 65    Time 8    Status New    Target Date 01/31/22                   Plan - 12/31/21 1205     Clinical Impression Statement Pt was reporting more pain in right knee since yesterday. Was wearing a soft knee brace. No increased swelling or redness noted but tender at lateral joint line. Assessed STG and met 3/4 of them showing improving functional strength on LLE. Pt was walking  slower today due to right knee so gait speed did not increase at 0.12ms but had better gait quality with increased left foot clearance and heel strike. Pt continues to benefit from skilled PT to continue to progress towards goals.    Personal Factors and Comorbidities Comorbidity 3+    Comorbidities hypothyroidism, HLD, HTN, CAD, SSS s/p pacemaker in 11/2020, COPD, OSA on cpap. s/p right TKR 12/15.    Examination-Activity Limitations Locomotion Level;Transfers;Bathing;Squat;Stairs;Stand    Examination-Participation Restrictions Community Activity;Cleaning;Meal Prep;Yard Work    SMerchant navy officerEvolving/Moderate complexity    Rehab Potential Good    PT Frequency 2x / week   plus eval   PT Duration 8 weeks    PT Treatment/Interventions ADLs/Self Care Home Management;Cryotherapy;Gait training;Stair training;Functional mobility training;Therapeutic activities;Therapeutic exercise;Balance training;DME Instruction;Neuromuscular re-education;Manual techniques;Passive range of motion;Scar mobilization;Vestibular;Patient/family education    PT Next Visit Plan How did ortho visit go about right knee pain? Nustep warmup or cool down. Continue gait training with RW working on increasing  left hip flexion. Continue with step-ups with LLE for functional strengthening watching for buckling. Standing Balance Training/Weight Shift.    Consulted and Agree with Plan of Care Patient;Family member/caregiver    Family Member Consulted wife             Patient will benefit from skilled therapeutic intervention in order to improve the following deficits and impairments:  Abnormal gait, Decreased mobility, Decreased strength, Increased edema, Decreased knowledge of use of DME, Decreased balance, Decreased activity tolerance, Decreased endurance, Pain, Decreased range of motion  Visit Diagnosis: Other abnormalities of gait and mobility  Muscle weakness (generalized)     Problem List Patient Active Problem List   Diagnosis Date Noted   Acute CV[place] (cerebrovascular accident) ([place] 11/20/2021   Osteoarthritis of right knee 10/31/2021   OSA on CPAP 08/26/2021   DOE (dyspnea on exertion) 04/04/2021   Anemia due to vitamin B12 deficiency 04/04/2021   Statin myopathy 09/18/2020   Coronary artery disease involving native coronary artery of native heart without angina pectoris 09/11/2020   Essential hypertension 09/11/2020   Hyperlipidemia 09/11/2020   Unstable angina ([facility]CC) 08/24/2020   Aortic atherosclerosis ([facility] 07/31/2020   Other pancytopenia ([facility] 03/29/2020   Bicytopenia 03/08/2020   Avascular necrosis of hip, left ([facility] 03/09/2017   Coronary artery disease with stable angina pectoris ([facility] 01/14/2017    EElecta Sniff PT, DPT, NCS 12/31/2021, 12:15 PM  [facility] [address]Phone: [phone]  Fax:  [phone] Name: Austin HAKIMMRN: [id number] of Birth: [birth year]

## 2022-01-01 ENCOUNTER — Other Ambulatory Visit (HOSPITAL_COMMUNITY): Payer: Self-pay

## 2022-01-02 ENCOUNTER — Ambulatory Visit: Payer: Medicare Other

## 2022-01-02 ENCOUNTER — Ambulatory Visit: Payer: Medicare Other | Admitting: Skilled Nursing Facility1

## 2022-01-07 ENCOUNTER — Ambulatory Visit: Payer: Medicare Other

## 2022-01-07 ENCOUNTER — Other Ambulatory Visit (HOSPITAL_COMMUNITY): Payer: Self-pay

## 2022-01-09 ENCOUNTER — Ambulatory Visit: Payer: Medicare Other

## 2022-01-09 ENCOUNTER — Other Ambulatory Visit (HOSPITAL_COMMUNITY): Payer: Self-pay

## 2022-01-09 ENCOUNTER — Telehealth: Payer: Self-pay

## 2022-01-09 NOTE — Telephone Encounter (Signed)
PT called pt to check on him due to no show at visit. Left message with next appointment time for 2/28 at 11 and asked him to call if he can not make it. Cherly Anderson, PT, DPT, NCS

## 2022-01-14 ENCOUNTER — Ambulatory Visit: Payer: Medicare Other

## 2022-01-15 ENCOUNTER — Telehealth: Payer: Self-pay | Admitting: Pulmonary Disease

## 2022-01-15 MED ORDER — SPIRIVA RESPIMAT 2.5 MCG/ACT IN AERS: 2.0000 | INHALATION_SPRAY | Freq: Every day | RESPIRATORY_TRACT | 3 refills | Status: AC

## 2022-01-15 NOTE — Telephone Encounter (Signed)
Spoke with pt that wants to go back to [facility] instead of using [facility]. Last AVS states Dr. Ander Slade is ok with this. Pt request 90 day supply be sent into [facility] which was done. Pt stated nothing further needed at this time.  ?

## 2022-01-15 NOTE — Telephone Encounter (Signed)
Attempted to call pt but unable to reach. Left message for him to return call. °

## 2022-01-16 ENCOUNTER — Ambulatory Visit: Payer: Medicare Other

## 2022-01-17 ENCOUNTER — Ambulatory Visit: Payer: Medicare Other | Admitting: Internal Medicine

## 2022-01-21 ENCOUNTER — Ambulatory Visit: Payer: Medicare Other | Attending: Internal Medicine

## 2022-01-21 ENCOUNTER — Other Ambulatory Visit: Payer: Self-pay

## 2022-01-21 DIAGNOSIS — M25661 Stiffness of right knee, not elsewhere classified: Secondary | ICD-10-CM | POA: Diagnosis present

## 2022-01-21 DIAGNOSIS — M6281 Muscle weakness (generalized): Secondary | ICD-10-CM | POA: Diagnosis present

## 2022-01-21 DIAGNOSIS — R2681 Unsteadiness on feet: Secondary | ICD-10-CM | POA: Insufficient documentation

## 2022-01-21 DIAGNOSIS — R2689 Other abnormalities of gait and mobility: Secondary | ICD-10-CM | POA: Diagnosis not present

## 2022-01-21 NOTE — Therapy (Signed)
OUTPATIENT PHYSICAL THERAPY TREATMENT NOTE/RE-CERTIFICATION   Patient Name: Austin Oliver MRN: [id number] DOB:05/27/1956, 66 y.o., male Today's Date: 01/21/2022  PCP: [name], MD REFERRING PROVIDER: Dr. Leonie Man,   referral was Dr. Florencia Reasons (hospitalist)     PT End of Session - 01/21/22 1047     Visit Number 7    Number of Visits 23    Date for PT Re-Evaluation 03/21/22    Authorization Type [employer] medicare so 10th visit progress note    Progress Note Due on Visit 10    PT Start Time 1050    PT Stop Time 1135    PT Time Calculation (min) 45 min    Equipment Utilized During Treatment Gait belt    Activity Tolerance Patient tolerated treatment well    Behavior During Therapy [facility] for tasks assessed/performed             Past Medical History:  Diagnosis Date   AICD (automatic cardioverter/defibrillator) present    for sick sinus syndrome   Cirrhosis of liver (HCC)    COPD (chronic obstructive pulmonary disease) ([place])    Coronary artery calcification seen on CAT scan 01/14/2017   Essential hypertension 09/11/2020   GERD (gastroesophageal reflux disease)    History of kidney stones    Hyperlipidemia    Hypothyroidism    OSA on CPAP    Mild with AHI 7.8/hr >>on CPAP   Osteoarthritis    Shingles 2012   Past Surgical History:  Procedure Laterality Date   CORONARY STENT INTERVENTION N/A 08/24/2020   Procedure: CORONARY STENT INTERVENTION;  Surgeon: [name], MD;  Location: [place] [facility];  Service: Cardiovascular;  Laterality: N/A;   HEEL SPUR SURGERY Left 80's   INTRAVASCULAR PRESSURE WIRE/FFR STUDY N/A 04/25/2021   Procedure: INTRAVASCULAR PRESSURE WIRE/FFR STUDY;  Surgeon: [name], MD;  Location: [place] [facility];  Service: Cardiovascular;  Laterality: N/A;   INTRAVASCULAR ULTRASOUND/IVUS N/A 08/24/2020   Procedure: Intravascular Ultrasound/IVUS;  Surgeon: [name], MD;  Location: [place] [facility];  Service: Cardiovascular;   Laterality: N/A;   KNEE ARTHROPLASTY Right 10/31/2021   Procedure: COMPUTER ASSISTED TOTAL KNEE ARTHROPLASTY;  Surgeon: Rod Can, MD;  Location: [facility];  Service: Orthopedics;  Laterality: Right;   KNEE SURGERY Bilateral    numerous times   LEFT HEART CATH AND CORONARY ANGIOGRAPHY N/A 08/24/2020   Procedure: LEFT HEART CATH AND CORONARY ANGIOGRAPHY;  Surgeon: [name], MD;  Location: [place] [facility];  Service: Cardiovascular;  Laterality: N/A;   LEFT HEART CATH AND CORONARY ANGIOGRAPHY N/A 04/25/2021   Procedure: LEFT HEART CATH AND CORONARY ANGIOGRAPHY;  Surgeon: [name], MD;  Location: [place] [facility];  Service: Cardiovascular;  Laterality: N/A;   NECK SURGERY  70's   PACEMAKER IMPLANT N/A 12/04/2020   Procedure: PACEMAKER IMPLANT;  Surgeon: [name], MD;  Location: [place] [facility];  Service: Cardiovascular;  Laterality: N/A;   TOTAL HIP ARTHROPLASTY Left 03/09/2017   Procedure: LEFT TOTAL HIP ARTHROPLASTY ANTERIOR APPROACH;  Surgeon: Rod Can, MD;  Location: [place];  Service: Orthopedics;  Laterality: Left;  Dr. requesting RNFA   Patient Active Problem List   Diagnosis Date Noted   Acute CVA (cerebrovascular accident) ([place]) 11/20/2021   Osteoarthritis of right knee 10/31/2021   OSA on CPAP 08/26/2021   DOE (dyspnea on exertion) 04/04/2021   Anemia due to vitamin B12 deficiency 04/04/2021   Statin myopathy 09/18/2020   Coronary artery disease involving native coronary artery  of native heart without angina pectoris 09/11/2020   Essential hypertension 09/11/2020   Hyperlipidemia 09/11/2020   Unstable angina ([place]) 08/24/2020   Aortic atherosclerosis ([place]) 07/31/2020   Other pancytopenia ([place]) 03/29/2020   Bicytopenia 03/08/2020   Avascular necrosis of hip, left ([place]) 03/09/2017   Coronary artery disease with stable angina pectoris ([place]) 01/14/2017    REFERRING DIAG: I63.9 (ICD-10-CM) - Acute CVA (cerebrovascular accident) ([place])  THERAPY  DIAG:  Other abnormalities of gait and mobility  Muscle weakness (generalized)  Unsteadiness on feet  Stiffness of right knee, not elsewhere classified  PERTINENT HISTORY: HLD, HTN, CAD, SSS s/p pacemaker in 11/2020, COPD, OSA on cpap. s/p right TKR 12/15.   PRECAUTIONS: Fall;ICD/Pacemaker   SUBJECTIVE: Patient reports had COVID, missed approximately two weeks. Reports he is feeling better, still having some congestion. Denies any significant changes since having COVID.   PAIN:  Are you having pain? No  TODAY'S TREATMENT:   [facility] PT Assessment - 01/21/22 0001       AROM   Right/Left Knee Right    Right Knee Extension -8    Right Knee Flexion 114      Ambulation/Gait   Ambulation/Gait Assistance Details Compelted ambulation x 575 ft. No pain. Reports mild fatigue in the arms > legs. Cues to stay close to AD for improved upright posture.    Ambulation Distance (Feet) 575 Feet      Standardized Balance Assessment   Standardized Balance Assessment Berg Balance Test;Timed Up and Go Test      Berg Balance Test   Sit to Stand Able to stand  independently using hands    Standing Unsupported Able to stand safely 2 minutes    Sitting with Back Unsupported but Feet Supported on Floor or Stool Able to sit safely and securely 2 minutes    Stand to Sit Sits safely with minimal use of hands    Transfers Able to transfer safely, definite need of hands    Standing Unsupported with Eyes Closed Able to stand 10 seconds safely    Standing Unsupported with Feet Together Able to place feet together independently and stand 1 minute safely    From Standing, Reach Forward with Outstretched Arm Can reach confidently >25 cm (10")   10"   From Standing Position, Pick up Object from Floor Able to pick up shoe safely and easily    From Standing Position, Turn to Look Behind Over each Shoulder Looks behind one side only/other side shows less weight shift    Turn 360 Degrees Able to turn 360 degrees  safely but slowly    Standing Unsupported, Alternately Place Feet on Step/Stool Able to complete 4 steps without aid or supervision    Standing Unsupported, One Foot in Front Able to take small step independently and hold 30 seconds    Standing on One Leg Able to lift leg independently and hold equal to or more than 3 seconds    Total Score 45    Berg comment: 45/56      Timed Up and Go Test   TUG Normal TUG    Normal TUG (seconds) 23.22    TUG Comments with use of RW             [facility] Adult PT Treatment/Exercise - 01/21/22 0001       Transfers   Transfers Sit to Stand;Stand to Sit    Sit to Stand 5: Supervision;With upper extremity assist    Stand to Sit 5: Supervision  Ambulation/Gait   Ambulation/Gait Yes    Ambulation/Gait Assistance 5: Supervision    Assistive device Rolling walker    Gait Pattern Step-through pattern;Decreased hip/knee flexion - right    Ambulation Surface Level;Indoor    Gait velocity 20.62 seconds = 0.48 m/s             PATIENT EDUCATION: Education details: Progress toward Goals; Updated POC Person educated: Patient and Spouse Education method: Explanation Education comprehension: verbalized understanding   HOME EXERCISE PROGRAM: Access Code: [email or web address]   PT Short Term Goals - 12/31/21 1112       PT SHORT TERM GOAL #1   Title Pt will be independent with initial HEP for strengthening, ROM and balance.    Baseline 12/31/21 Pt reports that he has been doing his exercises as prescribed.    Time 4    Period Weeks    Status Achieved    Target Date 01/01/22      PT SHORT TERM GOAL #2   Title Pt will increase left hip flexion from 2/5 to 3+/5 or better to assist with foot clearance with gait.    Baseline 12/03/21 2/5. 12/31/21 3+/5 left hip flexion    Time 4    Period Weeks    Status Achieved    Target Date 01/01/22      PT SHORT TERM GOAL #3   Title Pt will increase gait speed from 0.150ms to >0.520m for improved household mobility.     Baseline 12/03/21 0.3346m 12/31/21 0.55m16m  Time 4    Period Weeks    Status Not Met    Target Date 01/01/22      PT SHORT TERM GOAL #4   Title BergMerrilee Janskyl be completed and LTG updated    Baseline BergMerrilee Jansky completed    Time 4    Period Weeks    Status Achieved    Target Date 01/01/22              PT Long Term Goals - 12/31/21 1204       PT LONG TERM GOAL #1   Title Pt will be independent with progressive HEP for strengthening, ROM and balance to continue gains on own.    Time 8    Period Weeks    Status On-Going   Target Date 01/31/22      PT LONG TERM GOAL #2   Title Pt will increase gait speed to >0.50m/s73mr improved community mobility.    Baseline 12/03/21 0.53m/s84m.48 m/s   Time 4    Period Weeks    Status On-Going   Target Date 01/31/22      PT LONG TERM GOAL #3   Title Pt will ambulate > 500' on varied surfaces with LRAD versus no device for improved short community distances.    Time 8   Baseline 575 ft indoors with RW   Period Weeks    Status Partially Met   Target Date 01/31/22      PT LONG TERM GOAL #4   Title Pt will decrease TUG from 36.95 sec to <25 sec for improved balance and functional mobility.    Baseline 12/03/21 36.95 sec with RW ; 23.22 secs with RW   Time 8    Period Weeks   Status MET   Target Date 01/31/22      PT LONG TERM GOAL #5   Title Pt will increase Berg from 38/56 to >45/56 for improved balance and decreased fall risk.  Baseline 12/10/21 38/56; 45/56   Time 8    Period Weeks    Status MET   Target Date 01/31/22      PT LONG TERM GOAL #6   Title Pt will ambulate up/down 4 steps with 1 rail mod I for improved community access.    Time 8    Period Weeks    Status On-Going   Target Date 01/31/22      PT LONG TERM GOAL #7   Title Pt will increase right knee ROM to 120 degrees flexion and 0 degrees extension for improved mobility.    Baseline 12/03/21 A/PROM seated flexion=106/110 and extension=-10/-2 ; AROM -8 ext,  114 flexion   Time 8    Period Weeks    Status On-Going   Target Date 01/31/22      PT LONG TERM GOAL #8   Title FOTO will increase from 65 to 77.    Baseline 12/03/21 65    Time 8    Status On-Going   Target Date 01/31/22            Updated Goals:   SHORT TERM GOALS:  Pt will improve gait speed to >/= 0.60 m/sec to demonstrate improved community ambulation  Baseline: 0.48ms. 12/31/21 0.212m, 01/21/22 0.48 m/s Target date:  02/21/22 Goal status: REVISED  2.  Pt will improve Berg Balance to >/= 48/56 to demonstrate improved standing balance and reduced fall risk  Baseline: 38/56, 45/56 Target date:  02/21/22 Goal status: REVISED  3.  Pt will improve TUG to </= 18 secs to demonstrated reduced fall risk  Baseline: 23.22 Target date:  02/21/22 Goal status: REVISED  4.  Pt will be able to ambulate >/= 600 ft with LRAD vs no AD to demo improved household/community mobility Baseline: 575 with RW Target date:  02/21/22 Goal status: REVISED   LONG TERM GOALS:  Pt will be independent with progressive ffinal HEP for strengthening, ROM and balance to continue gains on own. Baseline: will continue to benefit from progressive HEP Target date:  03/21/22 Goal status: IN PROGRESS  2.  Pt will increase gait speed to >0.71m20mfor improved community mobility. Baseline: 12/03/21 0.22m44m 0.48 m/s w/ RW Target date:  03/21/22 Goal status: IN PROGRESS  3.  Pt will ambulate >/= 500' on outdoor surfaces with LRAD versus no device for improved short community distances. Baseline: 575 ft indoors with RW Target date:  03/21/22 Goal status: REVISED  4.  Pt will decrease TUG to </= 15 sec for improved balance and functional mobility.  Baseline: 12/03/21 36.95 sec with RW ; 23.22 secs with RW Target date:  03/21/22 Goal status: REVISED  5.  Pt will increase Berg to >/= 52/56 for improved balance and decreased fall risk.  Baseline: 38/56, 45/56 Target date:  03/21/22 Goal status: REVISED  6.  Pt  will ambulate up/down 4 steps with 1 rail mod I for improved community access.  Baseline: bil rails step to pattern  Target date:  03/21/22 Goal status: IN PROGRESS  7.  FOTO will increase from 65 to 77.  Baseline: 65 Target date: 03/21/22 Goal status: IN PROGRESS  8.  Pt will increase right knee ROM to 120 degrees flexion and 0 degrees extension for improved mobility Baseline: 12/03/21 A/PROM seated flexion=106/110 and extension=-10/-2 ; AROM -8 ext, 114 flexion Target date: 03/21/22 Goal status: IN PROGRESS   Plan - 01/21/22 1138     Clinical Impression Statement Patient has had to miss approx 2 weeks  of PT services due to [place] -19. Due to absence reassessed patient current functional level and progress toward LTGs. Patietn has demonstrated some improvements with PT services including improved balance wtih 45/56 on Berg Balance, 23.22 secs with TUG, and gait speed of 0.48 m/s. Patient continues to require use of RW for functional ambulatio and AROM limitations in RLE and BLE weakness. Patient is making steady progress with PT services and will continue to benefit from skilled PT services to maximize functional mobility and progress toward all LTGs.    Personal Factors and Comorbidities Comorbidity 3+    Comorbidities hypothyroidism, HLD, HTN, CAD, SSS s/p pacemaker in 11/2020, COPD, OSA on cpap. s/p right TKR 12/15.    Examination-Activity Limitations Locomotion Level;Transfers;Bathing;Squat;Stairs;Stand    Examination-Participation Restrictions Community Activity;Cleaning;Meal Prep;Yard Work    [employer] Evolving/Moderate complexity    Rehab Potential Good    PT Frequency 2x / week   plus eval   PT Duration 8 weeks    PT Treatment/Interventions ADLs/Self Care Home Management;Cryotherapy;Gait training;Stair training;Functional mobility training;Therapeutic activities;Therapeutic exercise;Balance training;DME Instruction;Neuromuscular re-education;Manual  techniques;Passive range of motion;Scar mobilization;Vestibular;Patient/family education    PT Next Visit Plan Nustep warmup or cool down. Continue gait training with RW working on increasing left hip flexion. Continue with step-ups with LLE for functional strengthening watching for buckling. Standing Balance Training/Weight Shift.    Consulted and Agree with Plan of Care Patient;Family member/caregiver    Family Member Consulted wife             Jones Bales, [place], DPT 01/21/2022, 12:43 PM

## 2022-01-22 ENCOUNTER — Ambulatory Visit: Payer: Medicare Other | Admitting: Neurology

## 2022-01-22 ENCOUNTER — Encounter: Payer: Self-pay | Admitting: Neurology

## 2022-01-22 VITALS — BP 113/64 | HR 62 | Ht 69.0 in | Wt 215.2 lb

## 2022-01-22 DIAGNOSIS — R899 Unspecified abnormal finding in specimens from other organs, systems and tissues: Secondary | ICD-10-CM | POA: Insufficient documentation

## 2022-01-22 DIAGNOSIS — R9389 Abnormal findings on diagnostic imaging of other specified body structures: Secondary | ICD-10-CM | POA: Insufficient documentation

## 2022-01-22 DIAGNOSIS — U071 COVID-19: Secondary | ICD-10-CM

## 2022-01-22 DIAGNOSIS — H53461 Homonymous bilateral field defects, right side: Secondary | ICD-10-CM

## 2022-01-22 DIAGNOSIS — I63532 Cerebral infarction due to unspecified occlusion or stenosis of left posterior cerebral artery: Secondary | ICD-10-CM | POA: Diagnosis not present

## 2022-01-22 DIAGNOSIS — I639 Cerebral infarction, unspecified: Secondary | ICD-10-CM

## 2022-01-22 DIAGNOSIS — K7460 Unspecified cirrhosis of liver: Secondary | ICD-10-CM | POA: Insufficient documentation

## 2022-01-22 DIAGNOSIS — N200 Calculus of kidney: Secondary | ICD-10-CM | POA: Insufficient documentation

## 2022-01-22 DIAGNOSIS — K219 Gastro-esophageal reflux disease without esophagitis: Secondary | ICD-10-CM | POA: Insufficient documentation

## 2022-01-22 NOTE — Patient Instructions (Signed)
I had a long d/w patient and his wife about his recent multiple embolic strokes in the setting of recent COVID infection, risk for recurrent stroke/TIAs, personally independently reviewed imaging studies and stroke evaluation results and answered questions.Continue Plavix 75 mg daily for secondary stroke prevention and maintain strict control of hypertension with blood pressure goal below 130/90, diabetes with hemoglobin A1c goal below 6.5% and lipids with LDL cholesterol goal below 70 mg/dL. I also advised the patient to eat a healthy diet with plenty of whole grains, cereals, fruits and vegetables, exercise regularly and maintain ideal body weight .check follow-up lipid profile.  He was advised to continue ongoing physical therapy to improve his gait and balance.  Followup in the future with my nurse practitioner Janett Billow in 3 months or call earlier if necessary. Stroke Prevention Some medical conditions and behaviors can lead to a higher chance of having a stroke. You can help prevent a stroke by eating healthy, exercising, not smoking, and managing any medical conditions you have. Stroke is a leading cause of functional impairment. Primary prevention is particularly important because a majority of strokes are first-time events. Stroke changes the lives of not only those who experience a stroke but also their family and other caregivers. How can this condition affect me? A stroke is a medical emergency and should be treated right away. A stroke can lead to brain damage and can sometimes be life-threatening. If a person gets medical treatment right away, there is a better chance of surviving and recovering from a stroke. What can increase my risk? The following medical conditions may increase your risk of a stroke: Cardiovascular disease. High blood pressure (hypertension). Diabetes. High cholesterol. Sickle cell disease. Blood clotting disorders (hypercoagulable state). Obesity. Sleep disorders  (obstructive sleep apnea). Other risk factors include: Being older than age 73. Having a history of blood clots, stroke, or mini-stroke (transient ischemic attack, TIA). Genetic factors, such as race, ethnicity, or a family history of stroke. Smoking cigarettes or using other tobacco products. Taking birth control pills, especially if you also use tobacco. Heavy use of alcohol or drugs, especially cocaine and methamphetamine. Physical inactivity. What actions can I take to prevent this? Manage your health conditions High cholesterol levels. Eating a healthy diet is important for preventing high cholesterol. If cholesterol cannot be managed through diet alone, you may need to take medicines. Take any prescribed medicines to control your cholesterol as told by your health care provider. Hypertension. To reduce your risk of stroke, try to keep your blood pressure below 130/80. Eating a healthy diet and exercising regularly are important for controlling blood pressure. If these steps are not enough to manage your blood pressure, you may need to take medicines. Take any prescribed medicines to control hypertension as told by your health care provider. Ask your health care provider if you should monitor your blood pressure at home. Have your blood pressure checked every year, even if your blood pressure is normal. Blood pressure increases with age and some medical conditions. Diabetes. Eating a healthy diet and exercising regularly are important parts of managing your blood sugar (glucose). If your blood sugar cannot be managed through diet and exercise, you may need to take medicines. Take any prescribed medicines to control your diabetes as told by your health care provider. Get evaluated for obstructive sleep apnea. Talk to your health care provider about getting a sleep evaluation if you snore a lot or have excessive sleepiness. Make sure that any other medical conditions you have,  such as  atrial fibrillation or atherosclerosis, are managed. Nutrition Follow instructions from your health care provider about what to eat or drink to help manage your health condition. These instructions may include: Reducing your daily calorie intake. Limiting how much salt (sodium) you use to 1,500 milligrams (mg) each day. Using only healthy fats for cooking, such as olive oil, canola oil, or sunflower oil. Eating healthy foods. You can do this by: Choosing foods that are high in fiber, such as whole grains, and fresh fruits and vegetables. Eating at least 5 servings of fruits and vegetables a day. Try to fill one-half of your plate with fruits and vegetables at each meal. Choosing lean protein foods, such as lean cuts of meat, poultry without skin, fish, tofu, beans, and nuts. Eating low-fat dairy products. Avoiding foods that are high in sodium. This can help lower blood pressure. Avoiding foods that have saturated fat, trans fat, and cholesterol. This can help prevent high cholesterol. Avoiding processed and prepared foods. Counting your daily carbohydrate intake.  Lifestyle If you drink alcohol: Limit how much you have to: 0-1 drink a day for women who are not pregnant. 0-2 drinks a day for men. Know how much alcohol is in your drink. In the [place] one drink equals one 12 oz bottle of beer (369m), one 5 oz glass of wine (142m, or one 1 oz glass of hard liquor (4473m Do not use any products that contain nicotine or tobacco. These products include cigarettes, chewing tobacco, and vaping devices, such as e-cigarettes. If you need help quitting, ask your health care provider. Avoid secondhand smoke. Do not use drugs. Activity  Try to stay at a healthy weight. Get at least 30 minutes of exercise on most days, such as: Fast walking. Biking. Swimming. Medicines Take over-the-counter and prescription medicines only as told by your health care provider. Aspirin or blood thinners  (antiplatelets or anticoagulants) may be recommended to reduce your risk of forming blood clots that can lead to stroke. Avoid taking birth control pills. Talk to your health care provider about the risks of taking birth control pills if: You are over 35 50ars old. You smoke. You get very bad headaches. You have had a blood clot. Where to find more information American Stroke Association: www.strokeassociation.org Get help right away if: You or a loved one has any symptoms of a stroke. "BE FAST" is an easy way to remember the main warning signs of a stroke: B - Balance. Signs are dizziness, sudden trouble walking, or loss of balance. E - Eyes. Signs are trouble seeing or a sudden change in vision. F - Face. Signs are sudden weakness or numbness of the face, or the face or eyelid drooping on one side. A - Arms. Signs are weakness or numbness in an arm. This happens suddenly and usually on one side of the body. S - Speech. Signs are sudden trouble speaking, slurred speech, or trouble understanding what people say. T - Time. Time to call emergency services. Write down what time symptoms started. You or a loved one has other signs of a stroke, such as: A sudden, severe headache with no known cause. Nausea or vomiting. Seizure. These symptoms may represent a serious problem that is an emergency. Do not wait to see if the symptoms will go away. Get medical help right away. Call your local emergency services (911 in the [place] Do not drive yourself to the hospital. Summary You can help to prevent a stroke by eating healthy,  exercising, not smoking, limiting alcohol intake, and managing any medical conditions you may have. Do not use any products that contain nicotine or tobacco. These include cigarettes, chewing tobacco, and vaping devices, such as e-cigarettes. If you need help quitting, ask your health care provider. Remember "BE FAST" for warning signs of a stroke. Get help right away if you or a  loved one has any of these signs. This information is not intended to replace advice given to you by your health care provider. Make sure you discuss any questions you have with your health care provider. Document Revised: 06/04/2020 Document Reviewed: 06/04/2020 Elsevier Patient Education  [place].

## 2022-01-22 NOTE — Progress Notes (Signed)
[facility] [address] [place]. [place] [address] [phone]       OFFICE FOLLOW-UP NOTE  [name] Date of Birth:  1956-01-13 Medical Record Number:  [id number]   HPI: [name] is a 66 year old Caucasian male seen today for initial office follow-up visit following hospital consultation for stroke in January 2023.  History is obtained from the patient and his wife and review of electronic medical records and I personally reviewed available pertinent imaging films in PACS.Austin Oliver is a 65 y.o. male with a medical history significant for sick sinus syndrome s/p AICD, COPD, cirrhosis of the liver, coronary artery calcification, essential hypertension, hyperlipidemia, obstructive sleep apnea on CPAP, and osteoarthritis who initially presented to [facility] on 1/4 for evaluation of left lower extremity weakness since Thursday, December 29.  2022.  Patient recently had a right knee replacement on 10/31/21 with spinal anesthesia and has been in physical therapy following his procedure. He has been walking with a walker at home since his knee replacement. He noted that at his last PT appointment, he felt weak on the left leg. Over the past 2 days patient states that he has been dragging his left leg and has had two falls due to his weakness prompting him to be evaluated. Patient was also found recently to be COVID positive and states that since he started taking his medication, for the first 1-2 days, when he woke up he felt disoriented and confused and that this took some time to clear. He denies current confusion or disorientation. Patient's only other complaint is issues with initiating his urine flow since his surgery. Patient was subsequently transferred to [facility] ED to undergo MRI imaging with AICD revealing multiple acute/subacute infarcts in bilateral hemispheres concerning for embolic etiology and neurology was consulted for further evaluation and management.  He presented  outside time window for thrombolysis.  He was not a candidate for thrombectomy as he did not have a LVO.  MRI scan of the brain showed multiple scattered acute and early subacute infarcts in bilateral frontal and parietal lobes, left temporal and occipital lobe aspect of splenium of corpus callosum and left cerebellum.  Lower extremity venous Dopplers were negative for DVT.  LDL cholesterol is 106 mg percent and hemoglobin A1c 6.8.  He was started on dual antiplatelet therapy aspirin Plavix for 3 weeks followed by Plavix alone discharged home.  Patient states that his left leg is improving he is going to physical therapy as an outpatient he is able to walk with a walker.  His right knee pain is also improving.  He has unfortunately had repeat positive COVID test on 01/06/2022.  He feels his sugars are doing better than A1c is becoming.  He is tolerating Plavix well without bleeding or bruising.  His blood pressure is under good control today 113/64.  He is tolerating Crestor well without muscle aches and pains.  He has no new complaints. ROS:   14 system review of systems is positive for leg weakness, confusion difficulty walking all other systems negative  PMH:  Past Medical History:  Diagnosis Date   AICD (automatic cardioverter/defibrillator) present    for sick sinus syndrome   Cirrhosis of liver (HCC)    COPD (chronic obstructive pulmonary disease) (HCC)    Coronary artery calcification seen on CAT scan 01/14/2017   Essential hypertension 09/11/2020   GERD (gastroesophageal reflux disease)    History of kidney stones    Hyperlipidemia    Hypothyroidism  OSA on CPAP    Mild with AHI 7.8/hr >>on CPAP   Osteoarthritis    Shingles 2012    Social History:  Social History   Socioeconomic History   Marital status: Married    Spouse name: [name]   Number of children: 2   Years of education: Not on file   Highest education level: Not on file  Occupational History   Occupation:  Animator  Tobacco Use   Smoking status: Former    Packs/day: 2.00    Years: 45.00    Pack years: 90.00    Types: Cigarettes    Quit date: 12/05/2016    Years since quitting: 5.1   Smokeless tobacco: Never  Vaping Use   Vaping Use: Never used  Substance and Sexual Activity   Alcohol use: No   Drug use: No   Sexual activity: Not on file  Other Topics Concern   Not on file  Social History Narrative   Not on file   Social Determinants of Health   Financial Resource Strain: Not on file  Food Insecurity: Not on file  Transportation Needs: Not on file  Physical Activity: Not on file  Stress: Not on file  Social Connections: Not on file  Intimate Partner Violence: Not on file    Medications:   Current Outpatient Medications on File Prior to Visit  Medication Sig Dispense Refill   acetaminophen (TYLENOL) 325 MG tablet Take 1-2 tablets (325-650 mg total) by mouth every 6 (six) hours as needed for mild pain (pain score 1-3 or temp > 100.5).     albuterol (VENTOLIN HFA) 108 (90 Base) MCG/ACT inhaler Inhale 2 puffs into the lungs every 6 (six) hours as needed for shortness of breath or wheezing.     blood glucose meter kit and supplies KIT Dispense based on patient and insurance preference. Use up to four times daily as directed. 1 each 0   clopidogrel (PLAVIX) 75 MG tablet Take 1 tablet (75 mg total) by mouth daily. 90 tablet 1   docusate sodium (COLACE) 100 MG capsule Take 1 capsule (100 mg total) by mouth 2 (two) times daily. 10 capsule 0   ezetimibe (ZETIA) 10 MG tablet Take 10 mg by mouth in the morning.     finasteride (PROSCAR) 5 MG tablet Take 5 mg by mouth daily.     gabapentin (NEURONTIN) 100 MG capsule Take 200 mg by mouth at bedtime.     ibuprofen (ADVIL) 200 MG tablet Take 400 mg by mouth every 8 (eight) hours as needed (inflammation/pain.).     isosorbide mononitrate (IMDUR) 120 MG 24 hr tablet Take 1 tablet (120 mg total) by mouth daily. Please hold this  medication for three days, please check your blood pressure twice a day, please discuss with your pcp and cardiology regarding dose adjustment to avoid hypotension. Hold this medication if systolic blood pressure ( top number) is less than 100. 90 tablet 2   levothyroxine (SYNTHROID) 112 MCG tablet Take 112 mcg by mouth daily before breakfast.     loteprednol (LOTEMAX) 0.5 % ophthalmic suspension Place 1 drop into the left eye 2 (two) times a week. Mondays & Fridays     metFORMIN (GLUCOPHAGE) 500 MG tablet Take 1 tablet (500 mg total) by mouth 2 (two) times daily with a meal. 60 tablet 0   nitroGLYCERIN (NITROSTAT) 0.4 MG SL tablet Place 1 tablet (0.4 mg total) under the tongue every 5 (five) minutes x 3 doses as needed for chest  pain. 25 tablet 4   pantoprazole (PROTONIX) 40 MG tablet Take 40 mg by mouth daily after supper.      psyllium (METAMUCIL) 58.6 % powder Take 1 packet by mouth 3 (three) times daily. As needed     ranolazine (RANEXA) 1000 MG SR tablet TAKE 1  BY MOUTH TWICE DAILY 180 tablet 2   rosuvastatin (CRESTOR) 5 MG tablet Take 1 tablet (5 mg total) by mouth daily. 90 tablet 1   shark liver oil-cocoa butter (PREPARATION H) 0.25-3-85.5 % suppository Place 1 suppository rectally daily as needed for hemorrhoids.     tamsulosin (FLOMAX) 0.4 MG CAPS capsule Take 0.8 mg by mouth daily with supper.     Tiotropium Bromide Monohydrate (SPIRIVA RESPIMAT) 2.5 MCG/ACT AERS Inhale 2 puffs into the lungs daily. 12 g 3   vitamin B-12 (CYANOCOBALAMIN) 1000 MCG tablet Take 1,000 mcg by mouth in the morning.     No current facility-administered medications on file prior to visit.    Allergies:   Allergies  Allergen Reactions   Bee Venom Swelling    Extreme Swelling of site     Cleocin [Clindamycin Hcl] Rash    RASH IN BETWEEN FINGERS   Statins Other (See Comments)    Muscles aches with several statins    Physical Exam General: well developed, well nourished, seated, in no evident  distress Head: head normocephalic and atraumatic.  Neck: supple with no carotid or supraclavicular bruits Cardiovascular: regular rate and rhythm, no murmurs Musculoskeletal: no deformity Skin:  no rash/petichiae Vascular:  Normal pulses all extremities Vitals:   01/22/22 1056  BP: 113/64  Pulse: 62   Neurologic Exam Mental Status: Awake and fully alert. Oriented to place and time. Recent and remote memory intact. Attention span, concentration and fund of knowledge appropriate. Mood and affect appropriate.  Cranial Nerves: Fundoscopic exam reveals sharp disc margins. Pupils equal, briskly reactive to light. Extraocular movements full without nystagmus. Visual fields show right upper homonymous quadrantanopsia to confrontation. Hearing diminished bilaterally. Facial sensation intact. Face, tongue, palate moves normally and symmetrically.  Motor: Normal bulk and tone. Normal strength in all tested extremity muscles.  Except mild weakness of left hip flexors and ankle dorsiflexors.  Diminished fine finger movements on the left and moderate right over left upper extremity. Sensory.: intact to touch ,pinprick .position and vibratory sensation.  Coordination: Rapid alternating movements normal in all extremities. Finger-to-nose and heel-to-shin performed accurately bilaterally. Gait and Station: Arises from chair without difficulty. Stance is normal. Gait uses a walker and favors right knee due to pain and has mild left foot drop .  Tandem walking not attempted Reflexes: 1+ and symmetric. Toes downgoing.   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  Vascular risk factors hyperlipidemia, diabetes.  He is doing well with residual right upper quadrantanopsia residual gait and balance difficulties     PLAN:I had a long d/w patient and his wife about his  recent multiple embolic strokes in the setting of recent COVID infection, risk for recurrent stroke/TIAs, personally independently reviewed imaging studies and stroke evaluation results and answered questions.Continue Plavix 75 mg daily for secondary stroke prevention and maintain strict control of hypertension with blood pressure goal below 130/90, diabetes with hemoglobin A1c goal below 6.5% and lipids with LDL cholesterol goal below 70 mg/dL. I also advised the patient to eat a healthy diet with  plenty of whole grains, cereals, fruits and vegetables, exercise regularly and maintain ideal body weight .check follow-up lipid profile.  He was advised to continue ongoing physical therapy to improve his gait and balance.  He was advised not to drive.  Peripheral vision was improved.  Followup in the future with my nurse practitioner Janett Billow in 3 months or call earlier if necessary.Greater than 50% of time during this 40 minute visit was spent on counseling,explanation of diagnosis embolic strokes, recent COVID infection, planning of further management, discussion with patient and family and coordination of care.  Greater than 50% time in this prolonged 40 minutes visit was spent on counseling and coordination of care about his cryptogenic stroke, recent COVID infection, discussion about stroke prevention and treatment. Antony Contras, MD Note: This document was prepared with digital dictation and possible smart phrase technology. Any transcriptional errors that result from this process are unintentional

## 2022-01-23 ENCOUNTER — Other Ambulatory Visit: Payer: Self-pay

## 2022-01-23 ENCOUNTER — Ambulatory Visit: Payer: Medicare Other

## 2022-01-23 DIAGNOSIS — M6281 Muscle weakness (generalized): Secondary | ICD-10-CM

## 2022-01-23 DIAGNOSIS — R2681 Unsteadiness on feet: Secondary | ICD-10-CM

## 2022-01-23 DIAGNOSIS — R2689 Other abnormalities of gait and mobility: Secondary | ICD-10-CM | POA: Diagnosis not present

## 2022-01-23 LAB — LIPID PANEL
Chol/HDL Ratio: 4.1 ratio (ref 0.0–5.0)
Cholesterol, Total: 81 mg/dL — ABNORMAL LOW (ref 100–199)
HDL: 20 mg/dL — ABNORMAL LOW (ref >39)
LDL Chol Calc (NIH): 33 mg/dL (ref 0–99)
Triglycerides: 168 mg/dL — ABNORMAL HIGH (ref 0–149)
VLDL Cholesterol Cal: 28 mg/dL (ref 5–40)

## 2022-01-23 NOTE — Therapy (Signed)
OUTPATIENT PHYSICAL THERAPY TREATMENT NOTE   Patient Name: Austin Oliver MRN: [id number] DOB:December 18, 1955, 66 y.o., male Today's Date: 01/23/2022  PCP: [name], MD REFERRING PROVIDER: Dr. Leonie Man,   referral was Dr. Florencia Reasons (hospitalist)     PT End of Session - 01/23/22 1103     Visit Number 8    Number of Visits 23    Date for PT Re-Evaluation 03/21/22    Authorization Type [employer] medicare so 10th visit progress note    Progress Note Due on Visit 10    PT Start Time 1100    PT Stop Time 1145    PT Time Calculation (min) 45 min    Equipment Utilized During Treatment Gait belt    Activity Tolerance Patient tolerated treatment well    Behavior During Therapy [facility] for tasks assessed/performed             Past Medical History:  Diagnosis Date   AICD (automatic cardioverter/defibrillator) present    for sick sinus syndrome   Cirrhosis of liver (HCC)    COPD (chronic obstructive pulmonary disease) ([place])    Coronary artery calcification seen on CAT scan 01/14/2017   Essential hypertension 09/11/2020   GERD (gastroesophageal reflux disease)    History of kidney stones    Hyperlipidemia    Hypothyroidism    OSA on CPAP    Mild with AHI 7.8/hr >>on CPAP   Osteoarthritis    Shingles 2012   Past Surgical History:  Procedure Laterality Date   CORONARY STENT INTERVENTION N/A 08/24/2020   Procedure: CORONARY STENT INTERVENTION;  Surgeon: [name], MD;  Location: [place] [facility];  Service: Cardiovascular;  Laterality: N/A;   HEEL SPUR SURGERY Left 80's   INTRAVASCULAR PRESSURE WIRE/FFR STUDY N/A 04/25/2021   Procedure: INTRAVASCULAR PRESSURE WIRE/FFR STUDY;  Surgeon: [name], MD;  Location: [place] [facility];  Service: Cardiovascular;  Laterality: N/A;   INTRAVASCULAR ULTRASOUND/IVUS N/A 08/24/2020   Procedure: Intravascular Ultrasound/IVUS;  Surgeon: [name], MD;  Location: [place] [facility];  Service: Cardiovascular;  Laterality: N/A;    KNEE ARTHROPLASTY Right 10/31/2021   Procedure: COMPUTER ASSISTED TOTAL KNEE ARTHROPLASTY;  Surgeon: Rod Can, MD;  Location: [facility];  Service: Orthopedics;  Laterality: Right;   KNEE SURGERY Bilateral    numerous times   LEFT HEART CATH AND CORONARY ANGIOGRAPHY N/A 08/24/2020   Procedure: LEFT HEART CATH AND CORONARY ANGIOGRAPHY;  Surgeon: [name], MD;  Location: [place] [facility];  Service: Cardiovascular;  Laterality: N/A;   LEFT HEART CATH AND CORONARY ANGIOGRAPHY N/A 04/25/2021   Procedure: LEFT HEART CATH AND CORONARY ANGIOGRAPHY;  Surgeon: [name], MD;  Location: [place] [facility];  Service: Cardiovascular;  Laterality: N/A;   NECK SURGERY  70's   PACEMAKER IMPLANT N/A 12/04/2020   Procedure: PACEMAKER IMPLANT;  Surgeon: [name], MD;  Location: [place] [facility];  Service: Cardiovascular;  Laterality: N/A;   TOTAL HIP ARTHROPLASTY Left 03/09/2017   Procedure: LEFT TOTAL HIP ARTHROPLASTY ANTERIOR APPROACH;  Surgeon: Rod Can, MD;  Location: [place];  Service: Orthopedics;  Laterality: Left;  Dr. requesting RNFA   Patient Active Problem List   Diagnosis Date Noted   Gastroesophageal reflux disease 01/22/2022   Kidney stone 01/22/2022   Ultrasound scan abnormal 01/22/2022   Unspecified abnormal finding in specimens from other organs, systems and tissues 01/22/2022   Unspecified cirrhosis of liver ([place]) 01/22/2022   Acute CVA (cerebrovascular accident) ([place]) 11/20/2021   Stiffness of  right knee 11/04/2021   Osteoarthritis of right knee 10/31/2021   OSA on CPAP 08/26/2021   DOE (dyspnea on exertion) 04/04/2021   Anemia due to vitamin B12 deficiency 04/04/2021   Statin myopathy 09/18/2020   Coronary artery disease involving native coronary artery of native heart without angina pectoris 09/11/2020   Essential hypertension 09/11/2020   Hyperlipidemia 09/11/2020   Unstable angina (HCC) 08/24/2020   Aortic atherosclerosis ([place]) 07/31/2020   Other  pancytopenia ([place]) 03/29/2020   Bicytopenia 03/08/2020   Pain in right knee 11/22/2019   Cervical post-laminectomy syndrome 05/09/2019   DDD (degenerative disc disease), cervical 05/09/2019   Chronic neck pain 03/15/2019   Trigger finger of right hand 02/23/2018   Avascular necrosis of hip, left ([place]) 03/09/2017   Coronary artery disease with stable angina pectoris ([place]) 01/14/2017    REFERRING DIAG: I63.9 (ICD-10-CM) - Acute CVA (cerebrovascular accident) ([place])  THERAPY DIAG:  Muscle weakness (generalized)  Other abnormalities of gait and mobility  Unsteadiness on feet  PERTINENT HISTORY: HLD, HTN, CAD, SSS s/p pacemaker in 11/2020, COPD, OSA on cpap. s/p right TKR 12/15.   PRECAUTIONS: Fall;ICD/Pacemaker   SUBJECTIVE: Pt saw Dr. Leonie Man yesterday. He wants him to continue to work with PT. Pt still having more trouble with pain at night which affects his sleep.  PAIN:  Are you having pain? Yes NPRS scale: 2/10 Pain location: right knee Pain orientation: Right  PAIN TYPE: aching Pain description: intermittent  Aggravating factors: worse at night Relieving factors: movement and meds   TODAY'S TREATMENT:  01/23/22: NuStep x 6 min level 5 BLE and BUE for strengthening and activity tolerance. O2=97% and HR=62. Gait with RW 230' with verbal cues for increased left foot clearance throughout and upright posture. In // bars:  reciprocal stepping over 4 2x4" bolsters with BUE support x 6 bouts, side stepping over bolsters x 6 bouts with cues for technique and to leave room for trailing foot. Step-ups on 4" step x 10 with each leg with verbal cues to prevent locking out left knee with stepping up with tactile cues  to prevent. Marching gait forwards then backwards steps 8' x 6. Verbal cues for upright posture. Then performed walking forwards with walker and backing up 10' x 3 with cues to not push down hard through walker to slide back each step     PATIENT EDUCATION: Education  details: Continue current HEP Person educated: Patient and Spouse Education method: Explanation Education comprehension: verbalized understanding   HOME EXERCISE PROGRAM: Access Code: [email or web address]     SHORT TERM GOALS:  Pt will improve gait speed to >/= 0.60 m/sec to demonstrate improved community ambulation  Baseline: 0.12ms. 12/31/21 0.243m, 01/21/22 0.48 m/s Target date:  02/21/22 Goal status: REVISED  2.  Pt will improve Berg Balance to >/= 48/56 to demonstrate improved standing balance and reduced fall risk  Baseline: 38/56, 45/56 Target date:  02/21/22 Goal status: REVISED  3.  Pt will improve TUG to </= 18 secs to demonstrated reduced fall risk  Baseline: 23.22 Target date:  02/21/22 Goal status: REVISED  4.  Pt will be able to ambulate >/= 600 ft with LRAD vs no AD to demo improved household/community mobility Baseline: 575 with RW Target date:  02/21/22 Goal status: REVISED   LONG TERM GOALS:  Pt will be independent with progressive ffinal HEP for strengthening, ROM and balance to continue gains on own. Baseline: will continue to benefit from progressive HEP Target date:  03/21/22 Goal status: IN PROGRESS  2.  Pt will increase gait speed to >0.14ms for improved community mobility. Baseline: 12/03/21 0.322m ; 0.48 m/s w/ RW Target date:  03/21/22 Goal status: IN PROGRESS  3.  Pt will ambulate >/= 500' on outdoor surfaces with LRAD versus no device for improved short community distances. Baseline: 575 ft indoors with RW Target date:  03/21/22 Goal status: REVISED  4.  Pt will decrease TUG to </= 15 sec for improved balance and functional mobility.  Baseline: 12/03/21 36.95 sec with RW ; 23.22 secs with RW Target date:  03/21/22 Goal status: REVISED  5.  Pt will increase Berg to >/= 52/56 for improved balance and decreased fall risk.  Baseline: 38/56, 45/56 Target date:  03/21/22 Goal status: REVISED  6.  Pt will ambulate up/down 4 steps with 1 rail mod I for improved  community access.  Baseline: bil rails step to pattern  Target date:  03/21/22 Goal status: IN PROGRESS  7.  FOTO will increase from 65 to 77.  Baseline: 65 Target date: 03/21/22 Goal status: IN PROGRESS  8.  Pt will increase right knee ROM to 120 degrees flexion and 0 degrees extension for improved mobility Baseline: 12/03/21 A/PROM seated flexion=106/110 and extension=-10/-2 ; AROM -8 ext, 114 flexion Target date: 03/21/22 Goal status: IN PROGRESS   Plan - 01/21/22 1138     Clinical Impression Statement Pt was able to progress activities to more standing functional activities. Pt needs tactile cuing to prevent hyperextension at left knee at times.     Personal Factors and Comorbidities Comorbidity 3+    Comorbidities hypothyroidism, HLD, HTN, CAD, SSS s/p pacemaker in 11/2020, COPD, OSA on cpap. s/p right TKR 12/15.    Examination-Activity Limitations Locomotion Level;Transfers;Bathing;Squat;Stairs;Stand    Examination-Participation Restrictions Community Activity;Cleaning;Meal Prep;Yard Work    [employer] officervolving/Moderate complexity    Rehab Potential Good    PT Frequency 2x / week   plus eval   PT Duration 8 weeks    PT Treatment/Interventions ADLs/Self Care Home Management;Cryotherapy;Gait training;Stair training;Functional mobility training;Therapeutic activities;Therapeutic exercise;Balance training;DME Instruction;Neuromuscular re-education;Manual techniques;Passive range of motion;Scar mobilization;Vestibular;Patient/family education    PT Next Visit Plan Nustep warmup or cool down. Continue gait training with RW working on increasing left hip flexion. Continue with step-ups with LLE for functional strengthening watching for buckling. Standing Balance Training/Weight Shift.    Consulted and Agree with Plan of Care Patient;Family member/caregiver    Family Member Consulted wife             EmElecta SniffPT, DPT, NCS 01/23/2022, 12:40 PM

## 2022-01-28 ENCOUNTER — Ambulatory Visit: Payer: Medicare Other

## 2022-01-28 ENCOUNTER — Other Ambulatory Visit: Payer: Self-pay

## 2022-01-28 DIAGNOSIS — M25661 Stiffness of right knee, not elsewhere classified: Secondary | ICD-10-CM

## 2022-01-28 DIAGNOSIS — M6281 Muscle weakness (generalized): Secondary | ICD-10-CM

## 2022-01-28 DIAGNOSIS — R2689 Other abnormalities of gait and mobility: Secondary | ICD-10-CM

## 2022-01-28 DIAGNOSIS — R2681 Unsteadiness on feet: Secondary | ICD-10-CM

## 2022-01-28 NOTE — Therapy (Signed)
?OUTPATIENT PHYSICAL THERAPY TREATMENT NOTE ? ? ?Patient Name: Austin Oliver ?MRN: [id number] ?DOB:1956-04-03, 66 y.o., male ?Today's Date: 01/28/2022 ? ?PCP: [name], MD ?REFERRING PROVIDER: Dr. Leonie Man,  ? referral was Dr. Florencia Reasons (hospitalist)  ? ? ? PT End of Session - 01/28/22 1100   ? ? Visit Number 9   ? Number of Visits 23   ? Date for PT Re-Evaluation 03/21/22   ? Authorization Type [employer] medicare so 10th visit progress note   ? Progress Note Due on Visit 10   ? PT Start Time 1100   ? PT Stop Time 3716   ? PT Time Calculation (min) 43 min   ? Equipment Utilized During Treatment Gait belt   ? Activity Tolerance Patient tolerated treatment well   ? Behavior During Therapy [facility] for tasks assessed/performed   ? ?  ?  ? ?  ? ? ?Past Medical History:  ?Diagnosis Date  ? AICD (automatic cardioverter/defibrillator) present   ? for sick sinus syndrome  ? Cirrhosis of liver ([place])   ? COPD (chronic obstructive pulmonary disease) ([place])   ? Coronary artery calcification seen on CAT scan 01/14/2017  ? Essential hypertension 09/11/2020  ? GERD (gastroesophageal reflux disease)   ? History of kidney stones   ? Hyperlipidemia   ? Hypothyroidism   ? OSA on CPAP   ? Mild with AHI 7.8/hr >>on CPAP  ? Osteoarthritis   ? Shingles 2012  ? ?Past Surgical History:  ?Procedure Laterality Date  ? CORONARY STENT INTERVENTION N/A 08/24/2020  ? Procedure: CORONARY STENT INTERVENTION;  Surgeon: [name], MD;  Location: [place] [facility];  Service: Cardiovascular;  Laterality: N/A;  ? HEEL SPUR SURGERY Left 80's  ? INTRAVASCULAR PRESSURE WIRE/FFR STUDY N/A 04/25/2021  ? Procedure: INTRAVASCULAR PRESSURE WIRE/FFR STUDY;  Surgeon: [name], MD;  Location: [place] [facility];  Service: Cardiovascular;  Laterality: N/A;  ? INTRAVASCULAR ULTRASOUND/IVUS N/A 08/24/2020  ? Procedure: Intravascular Ultrasound/IVUS;  Surgeon: [name], MD;  Location: [place] [facility];  Service: Cardiovascular;  Laterality: N/A;  ?  KNEE ARTHROPLASTY Right 10/31/2021  ? Procedure: COMPUTER ASSISTED TOTAL KNEE ARTHROPLASTY;  Surgeon: Rod Can, MD;  [facility];  Service: Orthopedics;  Laterality: Right;  ? KNEE SURGERY Bilateral   ? numerous times  ? LEFT HEART CATH AND CORONARY ANGIOGRAPHY N/A 08/24/2020  ? Procedure: LEFT HEART CATH AND CORONARY ANGIOGRAPHY;  Surgeon: [name], MD;  Location: [place] [facility];  Service: Cardiovascular;  Laterality: N/A;  ? LEFT HEART CATH AND CORONARY ANGIOGRAPHY N/A 04/25/2021  ? Procedure: LEFT HEART CATH AND CORONARY ANGIOGRAPHY;  Surgeon: [name], MD;  Location: [place] [facility];  Service: Cardiovascular;  Laterality: N/A;  ? NECK SURGERY  70's  ? PACEMAKER IMPLANT N/A 12/04/2020  ? Procedure: PACEMAKER IMPLANT;  Surgeon: [name], MD;  Location: [place] [facility];  Service: Cardiovascular;  Laterality: N/A;  ? TOTAL HIP ARTHROPLASTY Left 03/09/2017  ? Procedure: LEFT TOTAL HIP ARTHROPLASTY ANTERIOR APPROACH;  Surgeon: Rod Can, MD;  Location: [place];  Service: Orthopedics;  Laterality: Left;  Dr. requesting RNFA  ? ?Patient Active Problem List  ? Diagnosis Date Noted  ? Gastroesophageal reflux disease 01/22/2022  ? Kidney stone 01/22/2022  ? Ultrasound scan abnormal 01/22/2022  ? Unspecified abnormal finding in specimens from other organs, systems and tissues 01/22/2022  ? Unspecified cirrhosis of liver ([place]) 01/22/2022  ? Acute CVA (cerebrovascular accident) ([place]) 11/20/2021  ? Stiffness of  right knee 11/04/2021  ? Osteoarthritis of right knee 10/31/2021  ? OSA on CPAP 08/26/2021  ? DOE (dyspnea on exertion) 04/04/2021  ? Anemia due to vitamin B12 deficiency 04/04/2021  ? Statin myopathy 09/18/2020  ? Coronary artery disease involving native coronary artery of native heart without angina pectoris 09/11/2020  ? Essential hypertension 09/11/2020  ? Hyperlipidemia 09/11/2020  ? Unstable angina ([place]) 08/24/2020  ? Aortic atherosclerosis ([place]) 07/31/2020  ? Other  pancytopenia ([place]) 03/29/2020  ? Bicytopenia 03/08/2020  ? Pain in right knee 11/22/2019  ? Cervical post-laminectomy syndrome 05/09/2019  ? DDD (degenerative disc disease), cervical 05/09/2019  ? Chronic neck pain 03/15/2019  ? Trigger finger of right hand 02/23/2018  ? Avascular necrosis of hip, left ([place]) 03/09/2017  ? Coronary artery disease with stable angina pectoris ([place]) 01/14/2017  ? ? ?REFERRING DIAG: I63.9 (ICD-10-CM) - Acute CVA (cerebrovascular accident) ([place]) ? ?THERAPY DIAG:  ?Muscle weakness (generalized) ? ?Other abnormalities of gait and mobility ? ?Unsteadiness on feet ? ?Stiffness of right knee, not elsewhere classified ? ?PERTINENT HISTORY: HLD, HTN, CAD, SSS s/p pacemaker in 11/2020, COPD, OSA on cpap. s/p right TKR 12/15.  ? ?PRECAUTIONS: Fall;ICD/Pacemaker  ? ?SUBJECTIVE: Patient reports that the pain in the R knee has improved. Over the weekend, notices feeling of weakness in both legs. Reports had to be cautious of bending the knees, because had a sense that it was going to buckle. No other new changes. ? ?PAIN:  ?Are you having pain? Yes ?NPRS scale: 2/10 ?Pain location: right knee ?Pain orientation: Right  ?PAIN TYPE: aching ?Pain description: intermittent  ?Aggravating factors: worse at night ?Relieving factors: movement and meds ? ? ?TODAY'S TREATMENT:  ? ?GAIT: ?Gait pattern: step through pattern, decreased step length- Right, decreased step length- Left, and decreased stance time- Right ?Assistive device utilized: Environmental consultant - 2 wheeled ?Level of assistance: SBA ?Comments: throughout therapy gym with activities.  ? ?Therex: ?Completed NuStep with BLE/BUE on Level 5 x 6 min for improved strengthening, ROM, and activity tolerance.  ? ?Lower Extremity Strengthening:  ?Lateral Step Ups: BLE, 6", Sets: 1, Reps: x 15 reps, alternating lower extremity progressed from BUE support to single UE support. Still decreased eccentric control noted requiring CGA intermittent ?Forward Step Ups: BLE, 6",  Sets: 1, Reps: x 10 reps on BLE, more challenge noted with pushing up with LLE > RLE requiring BUE support with completion  ?Long Arc Quads: BLE; Position: seated , Sets: 1, Reps 10, Weight/Theraband: 5# ankle weights with 5 second isometric hold and then focus on eccentric lowering for improved strengthening ? ?NMR:  ?Marching: in // bars, completed ambulation forward with alternating marching with initial single UE support progressing to no UE support to promote SLS and improved hip/knee flexion on float leg. Completed x 4 laps down and back in // bars. Intermittent CGA requiring due to imbalance.  ? ?  Completed sit <> stands from mat without UE support, completed 2 sets x 10 reps, mild fatigue noted at the end.  ? ?Completed alternating ant/posterior step over black balance beam with alternating lower extremity working on stepping strategy and obstacle clearance, completed initially with single UE support and progressing to no UE support, completed x 15 reps bilaterally. More challenge noted with RLE posterior and posterior step. ? ? ? ? ?PATIENT EDUCATION: ?Education details: Continue current HEP ?Person educated: Patient and Spouse ?Education method: Explanation ?Education comprehension: verbalized understanding ? ? ?HOME EXERCISE PROGRAM: ?Access Code: [email or web address] ? ? ? ? ?SHORT TERM  GOALS: ? ?Pt will improve gait speed to >/= 0.60 m/sec to demonstrate improved community ambulation ? ?Baseline: 0.39ms. 12/31/21 0.238m, 01/21/22 0.48 m/s ?Target date:  02/21/22 ?Goal status: REVISED ? ?2.  Pt will improve Berg Balance to >/= 48/56 to demonstrate improved standing balance and reduced fall risk ? ?Baseline: 38/56, 45/56 ?Target date:  02/21/22 ?Goal status: REVISED ? ?3.  Pt will improve TUG to </= 18 secs to demonstrated reduced fall risk ? ?Baseline: 23.22 ?Target date:  02/21/22 ?Goal status: REVISED ? ?4.  Pt will be able to ambulate >/= 600 ft with LRAD vs no AD to demo improved household/community  mobility ?Baseline: 575 with RW ?Target date:  02/21/22 ?Goal status: REVISED ? ? ?LONG TERM GOALS: ? ?Pt will be independent with progressive ffinal HEP for strengthening, ROM and balance to continue gains on own. ?Baseline

## 2022-01-30 ENCOUNTER — Other Ambulatory Visit: Payer: Self-pay

## 2022-01-30 ENCOUNTER — Ambulatory Visit: Payer: Medicare Other

## 2022-01-30 DIAGNOSIS — R2689 Other abnormalities of gait and mobility: Secondary | ICD-10-CM | POA: Diagnosis not present

## 2022-01-30 NOTE — Therapy (Signed)
?OUTPATIENT PHYSICAL THERAPY TREATMENT NOTE/Progress note ? ? ?Patient Name: Austin Oliver ?MRN: [id number] ?DOB:Apr 18, 1956, 66 y.o., male ?Today's Date: 01/30/2022 ? ?PCP: [name], MD ?REFERRING PROVIDER: Dr. Leonie Man,  ? referral was Dr. Florencia Reasons (hospitalist)  ? ?  Progress Note ? ?Reporting period 12/03/21 to 01/30/22 ? ?See Note below for Objective Data and Assessment of Progress/Goals ? ? PT End of Session - 01/30/22 1103   ? ? Visit Number 10   ? Number of Visits 23   ? Date for PT Re-Evaluation 03/21/22   ? Authorization Type [employer] medicare so 10th visit progress note   ? Progress Note Due on Visit 10   ? PT Start Time 1100   ? PT Stop Time 1144   ? PT Time Calculation (min) 44 min   ? Equipment Utilized During Treatment Gait belt   ? Activity Tolerance Patient tolerated treatment well   ? Behavior During Therapy [facility] for tasks assessed/performed   ? ?  ?  ? ?  ? ? ?Past Medical History:  ?Diagnosis Date  ? AICD (automatic cardioverter/defibrillator) present   ? for sick sinus syndrome  ? Cirrhosis of liver ([place])   ? COPD (chronic obstructive pulmonary disease) ([place])   ? Coronary artery calcification seen on CAT scan 01/14/2017  ? Essential hypertension 09/11/2020  ? GERD (gastroesophageal reflux disease)   ? History of kidney stones   ? Hyperlipidemia   ? Hypothyroidism   ? OSA on CPAP   ? Mild with AHI 7.8/hr >>on CPAP  ? Osteoarthritis   ? Shingles 2012  ? ?Past Surgical History:  ?Procedure Laterality Date  ? CORONARY STENT INTERVENTION N/A 08/24/2020  ? Procedure: CORONARY STENT INTERVENTION;  Surgeon: [name], MD;  Location: [place] [facility];  Service: Cardiovascular;  Laterality: N/A;  ? HEEL SPUR SURGERY Left 80's  ? INTRAVASCULAR PRESSURE WIRE/FFR STUDY N/A 04/25/2021  ? Procedure: INTRAVASCULAR PRESSURE WIRE/FFR STUDY;  Surgeon: [name], MD;  Location: [place] [facility];  Service: Cardiovascular;  Laterality: N/A;  ? INTRAVASCULAR ULTRASOUND/IVUS N/A 08/24/2020  ? Procedure:  Intravascular Ultrasound/IVUS;  Surgeon: [name], MD;  Location: [place] [facility];  Service: Cardiovascular;  Laterality: N/A;  ? KNEE ARTHROPLASTY Right 10/31/2021  ? Procedure: COMPUTER ASSISTED TOTAL KNEE ARTHROPLASTY;  Surgeon: Rod Can, MD;  [facility];  Service: Orthopedics;  Laterality: Right;  ? KNEE SURGERY Bilateral   ? numerous times  ? LEFT HEART CATH AND CORONARY ANGIOGRAPHY N/A 08/24/2020  ? Procedure: LEFT HEART CATH AND CORONARY ANGIOGRAPHY;  Surgeon: [name], MD;  Location: [place] [facility];  Service: Cardiovascular;  Laterality: N/A;  ? LEFT HEART CATH AND CORONARY ANGIOGRAPHY N/A 04/25/2021  ? Procedure: LEFT HEART CATH AND CORONARY ANGIOGRAPHY;  Surgeon: [name], MD;  Location: [place] [facility];  Service: Cardiovascular;  Laterality: N/A;  ? NECK SURGERY  70's  ? PACEMAKER IMPLANT N/A 12/04/2020  ? Procedure: PACEMAKER IMPLANT;  Surgeon: [name], MD;  Location: [place] [facility];  Service: Cardiovascular;  Laterality: N/A;  ? TOTAL HIP ARTHROPLASTY Left 03/09/2017  ? Procedure: LEFT TOTAL HIP ARTHROPLASTY ANTERIOR APPROACH;  Surgeon: Rod Can, MD;  Location: [place];  Service: Orthopedics;  Laterality: Left;  Dr. requesting RNFA  ? ?Patient Active Problem List  ? Diagnosis Date Noted  ? Gastroesophageal reflux disease 01/22/2022  ? Kidney stone 01/22/2022  ? Ultrasound scan abnormal 01/22/2022  ? Unspecified abnormal finding in specimens from other organs, systems and  tissues 01/22/2022  ? Unspecified cirrhosis of liver ([place]) 01/22/2022  ? Acute CVA (cerebrovascular accident) ([place]) 11/20/2021  ? Stiffness of right knee 11/04/2021  ? Osteoarthritis of right knee 10/31/2021  ? OSA on CPAP 08/26/2021  ? DOE (dyspnea on exertion) 04/04/2021  ? Anemia due to vitamin B12 deficiency 04/04/2021  ? Statin myopathy 09/18/2020  ? Coronary artery disease involving native coronary artery of native heart without angina pectoris 09/11/2020  ? Essential  hypertension 09/11/2020  ? Hyperlipidemia 09/11/2020  ? Unstable angina ([place]) 08/24/2020  ? Aortic atherosclerosis ([place]) 07/31/2020  ? Other pancytopenia ([place]) 03/29/2020  ? Bicytopenia 03/08/2020  ? Pain in right knee 11/22/2019  ? Cervical post-laminectomy syndrome 05/09/2019  ? DDD (degenerative disc disease), cervical 05/09/2019  ? Chronic neck pain 03/15/2019  ? Trigger finger of right hand 02/23/2018  ? Avascular necrosis of hip, left ([place]) 03/09/2017  ? Coronary artery disease with stable angina pectoris ([place]) 01/14/2017  ? ? ?REFERRING DIAG: I63.9 (ICD-10-CM) - Acute CVA (cerebrovascular accident) ([place]) ? ?THERAPY DIAG:  ?Other abnormalities of gait and mobility ? ?Muscle weakness (generalized) ? ?Unsteadiness on feet ? ?PERTINENT HISTORY: HLD, HTN, CAD, SSS s/p pacemaker in 11/2020, COPD, OSA on cpap. s/p right TKR 12/15.  ? ?PRECAUTIONS: Fall;ICD/Pacemaker  ? ?SUBJECTIVE: Pt reports he is doing pretty good. Has been working on getting up without hands. ? ?PAIN:  ?Are you having pain? Yes ?NPRS scale: 2/10 ?Pain location: right knee ?Pain orientation: Right  ?PAIN TYPE: aching, sore ?Pain description: intermittent  ?Aggravating factors: worse at night ?Relieving factors: movement and meds ? ? ?TODAY'S TREATMENT:  ? ?01/30/22: ?NuStep x 6 min level 6 with only BLE for strengthening and ROM.  ?Sit to stand x 10 from mat without hands. ?Sit to stand x 10 from large blue physioball. PT stabilizing ball for safety.  ?Seated on physioball: seated march x 10 each leg with cues to engage core and sit up tall, LAQ 10 x 2 on RLE. ?In // bars: gait with 1 UE support then down to no UE support CGA 8' x4, switched to using cane with quad tip in // bars 8' x 8 with cuing for technique. Had pt use cane on right side. Started with step-to and progressed to step through. Then gait overground with cane 200' CGA. ? ? ? ?PATIENT EDUCATION: ?Education details: Discussed walking along counter at home with 1 hand to simulate cane.  Also if trying to increase weight on one leg with sit to stand can stagger stance. ?Person educated: Patient and Spouse ?Education method: Explanation ?Education comprehension: verbalized understanding ? ? ?HOME EXERCISE PROGRAM: ?Access Code: [email or web address] ? ? ? ? ?SHORT TERM GOALS: ? ?Pt will improve gait speed to >/= 0.60 m/sec to demonstrate improved community ambulation ? ?Baseline: 0.7ms. 12/31/21 0.221m, 01/21/22 0.48 m/s ?Target date:  02/21/22 ?Goal status: REVISED ? ?2.  Pt will improve Berg Balance to >/= 48/56 to demonstrate improved standing balance and reduced fall risk ? ?Baseline: 38/56, 45/56 ?Target date:  02/21/22 ?Goal status: REVISED ? ?3.  Pt will improve TUG to </= 18 secs to demonstrated reduced fall risk ? ?Baseline: 23.22 ?Target date:  02/21/22 ?Goal status: REVISED ? ?4.  Pt will be able to ambulate >/= 600 ft with LRAD vs no AD to demo improved household/community mobility ?Baseline: 575 with RW ?Target date:  02/21/22 ?Goal status: REVISED ? ? ?LONG TERM GOALS: ? ?Pt will be independent with progressive ffinal HEP for strengthening, ROM and balance to continue  gains on own. ?Baseline: will continue to benefit from progressive HEP ?Target date:  03/21/22 ?Goal status: IN PROGRESS ? ?2.  Pt will increase gait speed to >0.48ms for improved community mobility. ?Baseline: 12/03/21 0.350m ; 0.48 m/s w/ RW ?Target date:  03/21/22 ?Goal status: IN PROGRESS ? ?3.  Pt will ambulate >/= 500' on outdoor surfaces with LRAD versus no device for improved short community distances. ?Baseline: 575 ft indoors with RW ?Target date:  03/21/22 ?Goal status: REVISED ? ?4.  Pt will decrease TUG to </= 15 sec for improved balance and functional mobility.  ?Baseline: 12/03/21 36.95 sec with RW ; 23.22 secs with RW ?Target date:  03/21/22 ?Goal status: REVISED ? ?5.  Pt will increase Berg to >/= 52/56 for improved balance and decreased fall risk.  ?Baseline: 38/56, 45/56 ?Target date:  03/21/22 ?Goal status: REVISED ? ?6.  Pt will  ambulate up/down 4 steps with 1 rail mod I for improved community access.  ?Baseline: bil rails step to pattern  ?Target date:  03/21/22 ?Goal status: IN PROGRESS ? ?7.  FOTO will increase from 65 to 77.  ?Ba

## 2022-02-04 ENCOUNTER — Ambulatory Visit: Payer: Medicare Other | Admitting: Physical Therapy

## 2022-02-04 ENCOUNTER — Other Ambulatory Visit: Payer: Self-pay

## 2022-02-04 DIAGNOSIS — R2689 Other abnormalities of gait and mobility: Secondary | ICD-10-CM | POA: Diagnosis not present

## 2022-02-04 DIAGNOSIS — R2681 Unsteadiness on feet: Secondary | ICD-10-CM

## 2022-02-04 DIAGNOSIS — M6281 Muscle weakness (generalized): Secondary | ICD-10-CM

## 2022-02-04 NOTE — Therapy (Signed)
?OUTPATIENT PHYSICAL THERAPY TREATMENT NOTE ? ? ?Patient Name: Austin Oliver ?MRN: [id number] ?DOB:06-27-1956, 66 y.o., male ?Today's Date: 02/05/2022 ? ?PCP: [name], MD ?REFERRING PROVIDER: Dr. Leonie Man,  ? referral was Dr. Florencia Reasons (hospitalist)  ? ?   ? ? PT End of Session - 02/04/22 1111   ? ? Visit Number 11   ? Number of Visits 23   ? Date for PT Re-Evaluation 03/21/22   ? Authorization Type [employer] medicare so 10th visit progress note   ? Progress Note Due on Visit 10   ? PT Start Time 1105   ? PT Stop Time 1150   ? PT Time Calculation (min) 45 min   ? Equipment Utilized During Treatment Gait belt   ? Activity Tolerance Patient tolerated treatment well   ? Behavior During Therapy [facility] for tasks assessed/performed   ? ?  ?  ? ?  ? ? ?Past Medical History:  ?Diagnosis Date  ? AICD (automatic cardioverter/defibrillator) present   ? for sick sinus syndrome  ? Cirrhosis of liver ([place])   ? COPD (chronic obstructive pulmonary disease) ([place])   ? Coronary artery calcification seen on CAT scan 01/14/2017  ? Essential hypertension 09/11/2020  ? GERD (gastroesophageal reflux disease)   ? History of kidney stones   ? Hyperlipidemia   ? Hypothyroidism   ? OSA on CPAP   ? Mild with AHI 7.8/hr >>on CPAP  ? Osteoarthritis   ? Shingles 2012  ? ?Past Surgical History:  ?Procedure Laterality Date  ? CORONARY STENT INTERVENTION N/A 08/24/2020  ? Procedure: CORONARY STENT INTERVENTION;  Surgeon: [name], MD;  Location: [place] [facility];  Service: Cardiovascular;  Laterality: N/A;  ? HEEL SPUR SURGERY Left 80's  ? INTRAVASCULAR PRESSURE WIRE/FFR STUDY N/A 04/25/2021  ? Procedure: INTRAVASCULAR PRESSURE WIRE/FFR STUDY;  Surgeon: [name], MD;  Location: [place] [facility];  Service: Cardiovascular;  Laterality: N/A;  ? INTRAVASCULAR ULTRASOUND/IVUS N/A 08/24/2020  ? Procedure: Intravascular Ultrasound/IVUS;  Surgeon: [name], MD;  Location: [place] [facility];  Service: Cardiovascular;  Laterality: N/A;   ? KNEE ARTHROPLASTY Right 10/31/2021  ? Procedure: COMPUTER ASSISTED TOTAL KNEE ARTHROPLASTY;  Surgeon: Rod Can, MD;  [facility];  Service: Orthopedics;  Laterality: Right;  ? KNEE SURGERY Bilateral   ? numerous times  ? LEFT HEART CATH AND CORONARY ANGIOGRAPHY N/A 08/24/2020  ? Procedure: LEFT HEART CATH AND CORONARY ANGIOGRAPHY;  Surgeon: [name], MD;  Location: [place] [facility];  Service: Cardiovascular;  Laterality: N/A;  ? LEFT HEART CATH AND CORONARY ANGIOGRAPHY N/A 04/25/2021  ? Procedure: LEFT HEART CATH AND CORONARY ANGIOGRAPHY;  Surgeon: [name], MD;  Location: [place] [facility];  Service: Cardiovascular;  Laterality: N/A;  ? NECK SURGERY  70's  ? PACEMAKER IMPLANT N/A 12/04/2020  ? Procedure: PACEMAKER IMPLANT;  Surgeon: [name], MD;  Location: [place] [facility];  Service: Cardiovascular;  Laterality: N/A;  ? TOTAL HIP ARTHROPLASTY Left 03/09/2017  ? Procedure: LEFT TOTAL HIP ARTHROPLASTY ANTERIOR APPROACH;  Surgeon: Rod Can, MD;  Location: [place];  Service: Orthopedics;  Laterality: Left;  Dr. requesting RNFA  ? ?Patient Active Problem List  ? Diagnosis Date Noted  ? Gastroesophageal reflux disease 01/22/2022  ? Kidney stone 01/22/2022  ? Ultrasound scan abnormal 01/22/2022  ? Unspecified abnormal finding in specimens from other organs, systems and tissues 01/22/2022  ? Unspecified cirrhosis of liver ([place]) 01/22/2022  ? Acute CVA (cerebrovascular accident) ([place]) 11/20/2021  ?  Stiffness of right knee 11/04/2021  ? Osteoarthritis of right knee 10/31/2021  ? OSA on CPAP 08/26/2021  ? DOE (dyspnea on exertion) 04/04/2021  ? Anemia due to vitamin B12 deficiency 04/04/2021  ? Statin myopathy 09/18/2020  ? Coronary artery disease involving native coronary artery of native heart without angina pectoris 09/11/2020  ? Essential hypertension 09/11/2020  ? Hyperlipidemia 09/11/2020  ? Unstable angina ([place]) 08/24/2020  ? Aortic atherosclerosis ([place]) 07/31/2020  ?  Other pancytopenia ([place]) 03/29/2020  ? Bicytopenia 03/08/2020  ? Pain in right knee 11/22/2019  ? Cervical post-laminectomy syndrome 05/09/2019  ? DDD (degenerative disc disease), cervical 05/09/2019  ? Chronic neck pain 03/15/2019  ? Trigger finger of right hand 02/23/2018  ? Avascular necrosis of hip, left ([place]) 03/09/2017  ? Coronary artery disease with stable angina pectoris ([place]) 01/14/2017  ? ? ?REFERRING DIAG: I63.9 (ICD-10-CM) - Acute CVA (cerebrovascular accident) ([place]) ? ?THERAPY DIAG:  ?Other abnormalities of gait and mobility ? ?Muscle weakness (generalized) ? ?Unsteadiness on feet ? ?PERTINENT HISTORY: HLD, HTN, CAD, SSS s/p pacemaker in 11/2020, COPD, OSA on cpap. s/p right TKR 12/15.  ? ?PRECAUTIONS: Fall;ICD/Pacemaker  ? ?SUBJECTIVE: Pt states he has been sleeping in his own bed for about a week instead of recliner and states he is resting better.  He states stiffness in R knee is getting better without taking any tylenol today.  He states he has been doing squats and progressed himself to one leg behind to inc weight bearing on individual leg. ? ?PAIN:  ?Are you having pain? Yes ?NPRS scale: 1/10 ?Pain location: right knee ?Pain orientation: Right  ?PAIN TYPE: aching, sore ?Pain description: intermittent  ?Aggravating factors: worse at night ?Relieving factors: movement and meds ? ? ?TODAY'S TREATMENT:  ?NuStep L6 x3mn>L7 x244ms>L6x1min with BLE only for strengthening with goal of 40 steps per min, achieved 48 avg steps per min ? ?STS in semi-tandem stance x10 each LE>STS in staggered stance using 3lb dumbbell for overhead press in standing x10 each LE behind ? ?Alt LE step ups on 6" step using R rail only>step up w/ opp hip drive with tapping used to facilitate hip flexor during pre-swing, pt with dec clearance on bilateral LE, fatigues quickly, minimal L knee buckling esp when cued for "soft knees" on return to BLE on floor. ? ?In // bars:  alt LE adv/retreat over 2" foam hurdle w/ and w/o UE  support>step over 2">4" foam hurdles using RUE support>no UE support x2 trial w/ pt demonstrating inc LLE buckling ? ?PATIENT EDUCATION: ?Education details: Discussed d/c of HEP exercises that no longer feel challenging including LAQ w/ resistance so that pt can focus attention on moderate to high challenge exercises on HEP. ?Person educated: Patient and Spouse ?Education method: Explanation ?Education comprehension: verbalized understanding ? ? ?HOME EXERCISE PROGRAM: ?Access Code: [email or web address] ? ?SHORT TERM GOALS: ? ?Pt will improve gait speed to >/= 0.60 m/sec to demonstrate improved community ambulation ? ?Baseline: 0.3344m 12/31/21 0.69m24m3/7/23 0.48 m/s ?Target date:  02/21/22 ?Goal status: REVISED ? ?2.  Pt will improve Berg Balance to >/= 48/56 to demonstrate improved standing balance and reduced fall risk ? ?Baseline: 38/56, 45/56 ?Target date:  02/21/22 ?Goal status: REVISED ? ?3.  Pt will improve TUG to </= 18 secs to demonstrated reduced fall risk ? ?Baseline: 23.22 ?Target date:  02/21/22 ?Goal status: REVISED ? ?4.  Pt will be able to ambulate >/= 600 ft with LRAD vs no AD to demo improved household/community mobility ?Baseline:  575 with RW ?Target date:  02/21/22 ?Goal status: REVISED ? ? ?LONG TERM GOALS: ? ?Pt will be independent with progressive final HEP for strengthening, ROM and balance to continue gains on own. ?Baseline: will continue to benefit from progressive HEP ?Target date:  03/21/22 ?Goal status: IN PROGRESS ? ?2.  Pt will increase gait speed to >0.11ms for improved community mobility. ?Baseline: 12/03/21 0.392m ; 0.48 m/s w/ RW ?Target date:  03/21/22 ?Goal status: IN PROGRESS ? ?3.  Pt will ambulate >/= 500' on outdoor surfaces with LRAD versus no device for improved short community distances. ?Baseline: 575 ft indoors with RW ?Target date:  03/21/22 ?Goal status: REVISED ? ?4.  Pt will decrease TUG to </= 15 sec for improved balance and functional mobility.  ?Baseline: 12/03/21 36.95 sec with RW ;  23.22 secs with RW ?Target date:  03/21/22 ?Goal status: REVISED ? ?5.  Pt will increase Berg to >/= 52/56 for improved balance and decreased fall risk.  ?Baseline: 38/56, 45/56 ?Target date:  03/21/22 ?Goal sta

## 2022-02-05 ENCOUNTER — Other Ambulatory Visit: Payer: Self-pay

## 2022-02-05 ENCOUNTER — Encounter: Payer: Self-pay | Admitting: Physical Therapy

## 2022-02-05 MED ORDER — ISOSORBIDE MONONITRATE ER 120 MG PO TB24: 120.0000 mg | ORAL_TABLET | Freq: Every day | ORAL | 3 refills | Status: DC

## 2022-02-05 NOTE — Telephone Encounter (Signed)
Called pt in regards to imdur 120 mg refill.  Pt reports BP ok.  Has had maybe 3-4 occurrences with SBP <100 after taking medication.  A few times DBP in the 50's.  Pt reports knows to sit down and move slow when this occurs.  Pt expresses no concerns with medication.  MD 11/29/21 OV note continue nitrates. Will refill imdur 120 mg PO QD #90 3 refills.  ?

## 2022-02-05 NOTE — Telephone Encounter (Signed)
[facility] mail order pharmacy is requesting a refill on isosorbide 120 mg tablet. Per doctor in the ED, Please hold this medication for three days, please check your blood pressure twice a day, please discuss with your pcp and cardiology regarding dose adjustment to avoid hypotension.  ?Hold this medication if systolic blood pressure ( top number) is less than 100. Would Dr. [name] like to refill with medication? Please address ?

## 2022-02-06 ENCOUNTER — Other Ambulatory Visit: Payer: Self-pay

## 2022-02-06 ENCOUNTER — Ambulatory Visit: Payer: Medicare Other

## 2022-02-06 DIAGNOSIS — R2681 Unsteadiness on feet: Secondary | ICD-10-CM

## 2022-02-06 DIAGNOSIS — R2689 Other abnormalities of gait and mobility: Secondary | ICD-10-CM

## 2022-02-06 DIAGNOSIS — M6281 Muscle weakness (generalized): Secondary | ICD-10-CM

## 2022-02-06 NOTE — Therapy (Signed)
?OUTPATIENT PHYSICAL THERAPY TREATMENT NOTE ? ? ?Patient Name: Austin Oliver ?MRN: [id number] ?DOB:[birth year], 66 y.o., male ?Today's Date: 02/06/2022 ? ?PCP: [name], MD ?REFERRING PROVIDER: Dr. Leonie Man,  ? referral was Dr. Florencia Reasons (hospitalist)  ? ?   ? ? PT End of Session - 02/06/22 1106   ? ? Visit Number 12   ? Number of Visits 23   ? Date for PT Re-Evaluation 03/21/22   ? Authorization Type [employer] medicare so 10th visit progress note   ? Progress Note Due on Visit 10   ? PT Start Time 1104   ? PT Stop Time 1145   ? PT Time Calculation (min) 41 min   ? Equipment Utilized During Treatment Gait belt   ? Activity Tolerance Patient tolerated treatment well   ? Behavior During Therapy [facility] for tasks assessed/performed   ? ?  ?  ? ?  ? ? ?Past Medical History:  ?Diagnosis Date  ? AICD (automatic cardioverter/defibrillator) present   ? for sick sinus syndrome  ? Cirrhosis of liver ([place])   ? COPD (chronic obstructive pulmonary disease) ([place])   ? Coronary artery calcification seen on CAT scan 01/14/2017  ? Essential hypertension 09/11/2020  ? GERD (gastroesophageal reflux disease)   ? History of kidney stones   ? Hyperlipidemia   ? Hypothyroidism   ? OSA on CPAP   ? Mild with AHI 7.8/hr >>on CPAP  ? Osteoarthritis   ? Shingles 2012  ? ?Past Surgical History:  ?Procedure Laterality Date  ? CORONARY STENT INTERVENTION N/A 08/24/2020  ? Procedure: CORONARY STENT INTERVENTION;  Surgeon: [name], MD;  Location: [place] [facility];  Service: Cardiovascular;  Laterality: N/A;  ? HEEL SPUR SURGERY Left 80's  ? INTRAVASCULAR PRESSURE WIRE/FFR STUDY N/A 04/25/2021  ? Procedure: INTRAVASCULAR PRESSURE WIRE/FFR STUDY;  Surgeon: [name], MD;  Location: [place] [facility];  Service: Cardiovascular;  Laterality: N/A;  ? INTRAVASCULAR ULTRASOUND/IVUS N/A 08/24/2020  ? Procedure: Intravascular Ultrasound/IVUS;  Surgeon: [name], MD;  Location: [place] [facility];  Service: Cardiovascular;  Laterality: N/A;   ? KNEE ARTHROPLASTY Right 10/31/2021  ? Procedure: COMPUTER ASSISTED TOTAL KNEE ARTHROPLASTY;  Surgeon: Rod Can, MD;  [facility];  Service: Orthopedics;  Laterality: Right;  ? KNEE SURGERY Bilateral   ? numerous times  ? LEFT HEART CATH AND CORONARY ANGIOGRAPHY N/A 08/24/2020  ? Procedure: LEFT HEART CATH AND CORONARY ANGIOGRAPHY;  Surgeon: [name], MD;  Location: [place] [facility];  Service: Cardiovascular;  Laterality: N/A;  ? LEFT HEART CATH AND CORONARY ANGIOGRAPHY N/A 04/25/2021  ? Procedure: LEFT HEART CATH AND CORONARY ANGIOGRAPHY;  Surgeon: [name], MD;  Location: [place] [facility];  Service: Cardiovascular;  Laterality: N/A;  ? NECK SURGERY  70's  ? PACEMAKER IMPLANT N/A 12/04/2020  ? Procedure: PACEMAKER IMPLANT;  Surgeon: [name], MD;  Location: [place] [facility];  Service: Cardiovascular;  Laterality: N/A;  ? TOTAL HIP ARTHROPLASTY Left 03/09/2017  ? Procedure: LEFT TOTAL HIP ARTHROPLASTY ANTERIOR APPROACH;  Surgeon: Rod Can, MD;  Location: [place];  Service: Orthopedics;  Laterality: Left;  Dr. requesting RNFA  ? ?Patient Active Problem List  ? Diagnosis Date Noted  ? Gastroesophageal reflux disease 01/22/2022  ? Kidney stone 01/22/2022  ? Ultrasound scan abnormal 01/22/2022  ? Unspecified abnormal finding in specimens from other organs, systems and tissues 01/22/2022  ? Unspecified cirrhosis of liver ([place]) 01/22/2022  ? Acute CVA (cerebrovascular accident) ([place]) 11/20/2021  ?  Stiffness of right knee 11/04/2021  ? Osteoarthritis of right knee 10/31/2021  ? OSA on CPAP 08/26/2021  ? DOE (dyspnea on exertion) 04/04/2021  ? Anemia due to vitamin B12 deficiency 04/04/2021  ? Statin myopathy 09/18/2020  ? Coronary artery disease involving native coronary artery of native heart without angina pectoris 09/11/2020  ? Essential hypertension 09/11/2020  ? Hyperlipidemia 09/11/2020  ? Unstable angina ([place]) 08/24/2020  ? Aortic atherosclerosis ([place]) 07/31/2020  ?  Other pancytopenia ([place]) 03/29/2020  ? Bicytopenia 03/08/2020  ? Pain in right knee 11/22/2019  ? Cervical post-laminectomy syndrome 05/09/2019  ? DDD (degenerative disc disease), cervical 05/09/2019  ? Chronic neck pain 03/15/2019  ? Trigger finger of right hand 02/23/2018  ? Avascular necrosis of hip, left ([place]) 03/09/2017  ? Coronary artery disease with stable angina pectoris ([place]) 01/14/2017  ? ? ?REFERRING DIAG: I63.9 (ICD-10-CM) - Acute CVA (cerebrovascular accident) ([place]) ? ?THERAPY DIAG:  ?Other abnormalities of gait and mobility ? ?Muscle weakness (generalized) ? ?Unsteadiness on feet ? ?PERTINENT HISTORY: HLD, HTN, CAD, SSS s/p pacemaker in 11/2020, COPD, OSA on cpap. s/p right TKR 12/15.  ? ?PRECAUTIONS: Fall;ICD/Pacemaker  ? ?SUBJECTIVE: Pt reports that right knee is a little more sore. Seems the worst at night. ? ?PAIN:  ?Are you having pain? Yes ?NPRS scale: 2/10 ?Pain location: right knee ?Pain orientation: Right  ?PAIN TYPE: aching, sore ?Pain description: intermittent  ?Aggravating factors: worse at night ?Relieving factors: movement and meds ? ? ?TODAY'S TREATMENT:  ?02/06/22: ?NuStep x 6 min level 6 with BLE for strengthening and ROM.  ? ?GAIT: ?Gait pattern: step through pattern and decreased stance time- Right ?Distance walked: 43'  ?Assistive device utilized: Environmental consultant - 2 wheeled ?Level of assistance: SBA ?Comments: Pt reported 1 episode of right knee feeling like would buckle at toe off ?Sit to stand from low back with 2" step under 1 foot x 10 each side without hands ?Stepping over 2x4" bolster with LLE and back with bilateral UE x 5 at walker and then x 10 with only 1 UE support CGA. ?Gait with cane with quad tip 230' on right with verbal and tactile cues for form. Trialed briefly on left per pt request but did better on right. ?In // bars: standing on airex x 30 sec eyes open, 30 sec eyes closed, head turns left/right x 10, up/down x 10, marching in place x 10 CGA. ? ? ? ?HOME EXERCISE  PROGRAM: ?Access Code: [email or web address] ? ? ?SHORT TERM GOALS: ? ?Pt will improve gait speed to >/= 0.60 m/sec to demonstrate improved community ambulation ? ?Baseline: 0.43ms. 12/31/21 0.251m, 01/21/22 0.48 m/s ?Target date:  02/21/22 ?Goal status: REVISED ? ?2.  Pt will improve Berg Balance to >/= 48/56 to demonstrate improved standing balance and reduced fall risk ? ?Baseline: 38/56, 45/56 ?Target date:  02/21/22 ?Goal status: REVISED ? ?3.  Pt will improve TUG to </= 18 secs to demonstrated reduced fall risk ? ?Baseline: 23.22 ?Target date:  02/21/22 ?Goal status: REVISED ? ?4.  Pt will be able to ambulate >/= 600 ft with LRAD vs no AD to demo improved household/community mobility ?Baseline: 575 with RW ?Target date:  02/21/22 ?Goal status: REVISED ? ? ?LONG TERM GOALS: ? ?Pt will be independent with progressive final HEP for strengthening, ROM and balance to continue gains on own. ?Baseline: will continue to benefit from progressive HEP ?Target date:  03/21/22 ?Goal status: IN PROGRESS ? ?2.  Pt will increase gait speed to >0.23m56mfor improved community  mobility. ?Baseline: 12/03/21 0.102ms ; 0.48 m/s w/ RW ?Target date:  03/21/22 ?Goal status: IN PROGRESS ? ?3.  Pt will ambulate >/= 500' on outdoor surfaces with LRAD versus no device for improved short community distances. ?Baseline: 575 ft indoors with RW ?Target date:  03/21/22 ?Goal status: REVISED ? ?4.  Pt will decrease TUG to </= 15 sec for improved balance and functional mobility.  ?Baseline: 12/03/21 36.95 sec with RW ; 23.22 secs with RW ?Target date:  03/21/22 ?Goal status: REVISED ? ?5.  Pt will increase Berg to >/= 52/56 for improved balance and decreased fall risk.  ?Baseline: 38/56, 45/56 ?Target date:  03/21/22 ?Goal status: REVISED ? ?6.  Pt will ambulate up/down 4 steps with 1 rail mod I for improved community access.  ?Baseline: bil rails step to pattern  ?Target date:  03/21/22 ?Goal status: IN PROGRESS ? ?7.  FOTO will increase from 65 to 77.  ?Baseline: 65 ?Target date:  03/21/22 ?Goal status: IN PROGRESS ? ?8.  Pt will increase right knee ROM to 120 degrees flexion and 0 degrees extension for improved mobility ?Baseline: 12/03/21 A/PROM seated flexion=106/110 and extension=-10/-

## 2022-02-10 ENCOUNTER — Other Ambulatory Visit: Payer: Self-pay

## 2022-02-10 MED ORDER — ISOSORBIDE MONONITRATE ER 120 MG PO TB24: 120.0000 mg | ORAL_TABLET | Freq: Every day | ORAL | 0 refills | Status: DC

## 2022-02-10 NOTE — Telephone Encounter (Signed)
Pt calling requesting a refill on isosorbide. This medication has a lot of information in pt's sig and will not be able to sent to pharmacy because of this. Please cut down the information in the sig to sent medication electronically. Please address ?

## 2022-02-10 NOTE — Telephone Encounter (Signed)
Refilled medication isosorbide mononitrate.   ?

## 2022-02-11 ENCOUNTER — Ambulatory Visit: Payer: Medicare Other

## 2022-02-11 ENCOUNTER — Other Ambulatory Visit: Payer: Self-pay

## 2022-02-11 DIAGNOSIS — R2689 Other abnormalities of gait and mobility: Secondary | ICD-10-CM

## 2022-02-11 DIAGNOSIS — R2681 Unsteadiness on feet: Secondary | ICD-10-CM

## 2022-02-11 DIAGNOSIS — M6281 Muscle weakness (generalized): Secondary | ICD-10-CM

## 2022-02-11 NOTE — Therapy (Signed)
?OUTPATIENT PHYSICAL THERAPY TREATMENT NOTE ? ? ?Patient Name: Austin Oliver ?MRN: [id number] ?DOB:[birth year], 66 y.o., male ?Today's Date: 02/11/2022 ? ?PCP: [name], MD ?REFERRING PROVIDER: Dr. Leonie Man,  ? referral was Dr. Florencia Reasons (hospitalist)  ? ?   ? ? PT End of Session - 02/11/22 1018   ? ? Visit Number 13   ? Number of Visits 23   ? Date for PT Re-Evaluation 03/21/22   ? Authorization Type [employer] medicare so 10th visit progress note   ? Progress Note Due on Visit 10   ? PT Start Time 1016   ? PT Stop Time 1057   ? PT Time Calculation (min) 41 min   ? Equipment Utilized During Treatment Gait belt   ? Activity Tolerance Patient tolerated treatment well   ? Behavior During Therapy [facility] for tasks assessed/performed   ? ?  ?  ? ?  ? ? ?Past Medical History:  ?Diagnosis Date  ? AICD (automatic cardioverter/defibrillator) present   ? for sick sinus syndrome  ? Cirrhosis of liver ([place])   ? COPD (chronic obstructive pulmonary disease) ([place])   ? Coronary artery calcification seen on CAT scan 01/14/2017  ? Essential hypertension 09/11/2020  ? GERD (gastroesophageal reflux disease)   ? History of kidney stones   ? Hyperlipidemia   ? Hypothyroidism   ? OSA on CPAP   ? Mild with AHI 7.8/hr >>on CPAP  ? Osteoarthritis   ? Shingles 2012  ? ?Past Surgical History:  ?Procedure Laterality Date  ? CORONARY STENT INTERVENTION N/A 08/24/2020  ? Procedure: CORONARY STENT INTERVENTION;  Surgeon: [name], MD;  Location: [place] [facility];  Service: Cardiovascular;  Laterality: N/A;  ? HEEL SPUR SURGERY Left 80's  ? INTRAVASCULAR PRESSURE WIRE/FFR STUDY N/A 04/25/2021  ? Procedure: INTRAVASCULAR PRESSURE WIRE/FFR STUDY;  Surgeon: [name], MD;  Location: [place] [facility];  Service: Cardiovascular;  Laterality: N/A;  ? INTRAVASCULAR ULTRASOUND/IVUS N/A 08/24/2020  ? Procedure: Intravascular Ultrasound/IVUS;  Surgeon: [name], MD;  Location: [place] [facility];  Service: Cardiovascular;  Laterality: N/A;   ? KNEE ARTHROPLASTY Right 10/31/2021  ? Procedure: COMPUTER ASSISTED TOTAL KNEE ARTHROPLASTY;  Surgeon: Rod Can, MD;  [facility];  Service: Orthopedics;  Laterality: Right;  ? KNEE SURGERY Bilateral   ? numerous times  ? LEFT HEART CATH AND CORONARY ANGIOGRAPHY N/A 08/24/2020  ? Procedure: LEFT HEART CATH AND CORONARY ANGIOGRAPHY;  Surgeon: [name], MD;  Location: [place] [facility];  Service: Cardiovascular;  Laterality: N/A;  ? LEFT HEART CATH AND CORONARY ANGIOGRAPHY N/A 04/25/2021  ? Procedure: LEFT HEART CATH AND CORONARY ANGIOGRAPHY;  Surgeon: [name], MD;  Location: [place] [facility];  Service: Cardiovascular;  Laterality: N/A;  ? NECK SURGERY  70's  ? PACEMAKER IMPLANT N/A 12/04/2020  ? Procedure: PACEMAKER IMPLANT;  Surgeon: [name], MD;  Location: [place] [facility];  Service: Cardiovascular;  Laterality: N/A;  ? TOTAL HIP ARTHROPLASTY Left 03/09/2017  ? Procedure: LEFT TOTAL HIP ARTHROPLASTY ANTERIOR APPROACH;  Surgeon: Rod Can, MD;  Location: [place];  Service: Orthopedics;  Laterality: Left;  Dr. requesting RNFA  ? ?Patient Active Problem List  ? Diagnosis Date Noted  ? Gastroesophageal reflux disease 01/22/2022  ? Kidney stone 01/22/2022  ? Ultrasound scan abnormal 01/22/2022  ? Unspecified abnormal finding in specimens from other organs, systems and tissues 01/22/2022  ? Unspecified cirrhosis of liver ([place]) 01/22/2022  ? Acute CVA (cerebrovascular accident) ([place]) 11/20/2021  ?  Stiffness of right knee 11/04/2021  ? Osteoarthritis of right knee 10/31/2021  ? OSA on CPAP 08/26/2021  ? DOE (dyspnea on exertion) 04/04/2021  ? Anemia due to vitamin B12 deficiency 04/04/2021  ? Statin myopathy 09/18/2020  ? Coronary artery disease involving native coronary artery of native heart without angina pectoris 09/11/2020  ? Essential hypertension 09/11/2020  ? Hyperlipidemia 09/11/2020  ? Unstable angina ([place]) 08/24/2020  ? Aortic atherosclerosis ([place]) 07/31/2020  ?  Other pancytopenia ([place]) 03/29/2020  ? Bicytopenia 03/08/2020  ? Pain in right knee 11/22/2019  ? Cervical post-laminectomy syndrome 05/09/2019  ? DDD (degenerative disc disease), cervical 05/09/2019  ? Chronic neck pain 03/15/2019  ? Trigger finger of right hand 02/23/2018  ? Avascular necrosis of hip, left ([place]) 03/09/2017  ? Coronary artery disease with stable angina pectoris ([place]) 01/14/2017  ? ? ?REFERRING DIAG: I63.9 (ICD-10-CM) - Acute CVA (cerebrovascular accident) ([place]) ? ?THERAPY DIAG:  ?Other abnormalities of gait and mobility ? ?Muscle weakness (generalized) ? ?Unsteadiness on feet ? ?PERTINENT HISTORY: HLD, HTN, CAD, SSS s/p pacemaker in 11/2020, COPD, OSA on cpap. s/p right TKR 12/15.  ? ?PRECAUTIONS: Fall;ICD/Pacemaker  ? ?SUBJECTIVE: Pt reports that today is the first day he has taken tylenol since last Thursday. Still doesn't fully trust the right leg as buckles at times. ? ?PAIN:  ?Are you having pain? Yes ?NPRS scale: 2/10 ?Pain location: right knee ?Pain orientation: Right  ?PAIN TYPE: aching, sore ?Pain description: intermittent  ?Aggravating factors: worse at night ?Relieving factors: movement and meds ? ? ?TODAY'S TREATMENT:  ? ?02/11/22: ?SciFit x 6 min level 5.5 BLE only for strengthening and ROM ?In // bars: standing on airex with chair behind performing mini-squats x 10 then performed on rockerboard positioned lateral 10 x 2 with verbal cues for form to stick bottom back more and not bring knees in front of toes. ?Standing on rockerboard positioned lateral: maintaining level without UE support eyes open x 30 sec, eyes closed x 30 sec with CGA/min assist, rocking board side to side with visual cues in mirror CGA with cues to shift weight over and not just hip. Pt had more difficulty shifting to the left. ?Standing on blue mat in staggered stance x 30 sec each position then added in staggered stance with trunk rotation moving 1.1# ball to each side x 10 in each position CGA. ?Walking without  UE support on blue mat in bars 8' x 4. ? ?GAIT: ?Gait pattern: step through pattern, decreased arm swing- Left, decreased step length- Right, decreased step length- Left, decreased stance time- Right, and decreased stance time- Left ?Distance walked: 200'  ?Assistive device utilized: Single point cane with quad tip ?Level of assistance: CGA ?Comments: Verbal cues to tighten gluts for more upright posture and weight shift with gait. ? ?  ? ? ?HOME EXERCISE PROGRAM: ?Access Code: [email or web address] ? ? ?SHORT TERM GOALS: ? ?Pt will improve gait speed to >/= 0.60 m/sec to demonstrate improved community ambulation ? ?Baseline: 0.68ms. 12/31/21 0.277m, 01/21/22 0.48 m/s ?Target date:  02/21/22 ?Goal status: REVISED ? ?2.  Pt will improve Berg Balance to >/= 48/56 to demonstrate improved standing balance and reduced fall risk ? ?Baseline: 38/56, 45/56 ?Target date:  02/21/22 ?Goal status: REVISED ? ?3.  Pt will improve TUG to </= 18 secs to demonstrated reduced fall risk ? ?Baseline: 23.22 ?Target date:  02/21/22 ?Goal status: REVISED ? ?4.  Pt will be able to ambulate >/= 600 ft with LRAD vs no AD to demo  improved household/community mobility ?Baseline: 575 with RW ?Target date:  02/21/22 ?Goal status: REVISED ? ? ?LONG TERM GOALS: ? ?Pt will be independent with progressive final HEP for strengthening, ROM and balance to continue gains on own. ?Baseline: will continue to benefit from progressive HEP ?Target date:  03/21/22 ?Goal status: IN PROGRESS ? ?2.  Pt will increase gait speed to >0.61ms for improved community mobility. ?Baseline: 12/03/21 0.312m ; 0.48 m/s w/ RW ?Target date:  03/21/22 ?Goal status: IN PROGRESS ? ?3.  Pt will ambulate >/= 500' on outdoor surfaces with LRAD versus no device for improved short community distances. ?Baseline: 575 ft indoors with RW ?Target date:  03/21/22 ?Goal status: REVISED ? ?4.  Pt will decrease TUG to </= 15 sec for improved balance and functional mobility.  ?Baseline: 12/03/21 36.95 sec with RW ; 23.22  secs with RW ?Target date:  03/21/22 ?Goal status: REVISED ? ?5.  Pt will increase Berg to >/= 52/56 for improved balance and decreased fall risk.  ?Baseline: 38/56, 45/56 ?Target date:  03/21/22 ?Goal statu

## 2022-02-13 ENCOUNTER — Ambulatory Visit: Payer: Medicare Other | Admitting: Physical Therapy

## 2022-02-13 DIAGNOSIS — R2689 Other abnormalities of gait and mobility: Secondary | ICD-10-CM | POA: Diagnosis not present

## 2022-02-13 DIAGNOSIS — R2681 Unsteadiness on feet: Secondary | ICD-10-CM

## 2022-02-13 DIAGNOSIS — M6281 Muscle weakness (generalized): Secondary | ICD-10-CM

## 2022-02-13 DIAGNOSIS — M25661 Stiffness of right knee, not elsewhere classified: Secondary | ICD-10-CM

## 2022-02-13 NOTE — Therapy (Signed)
?OUTPATIENT PHYSICAL THERAPY TREATMENT NOTE ? ? ?Patient Name: Austin Oliver ?MRN: [id number] ?DOB:[birth year], 66 y.o., male ?Today's Date: 02/13/2022 ? ?PCP: [name], MD ?REFERRING PROVIDER: Dr. Leonie Man,  ? referral was Dr. Florencia Reasons (hospitalist)  ? ?   ? ? PT End of Session - 02/13/22 1103   ? ? Visit Number 14   ? Number of Visits 23   ? Date for PT Re-Evaluation 03/21/22   ? Authorization Type [employer] medicare so 10th visit progress note   ? Progress Note Due on Visit 10   ? PT Start Time 1100   ? PT Stop Time 0160   ? PT Time Calculation (min) 42 min   ? Equipment Utilized During Treatment Gait belt   ? Activity Tolerance Patient tolerated treatment well   ? Behavior During Therapy [facility] for tasks assessed/performed   ? ?  ?  ? ?  ? ? ?Past Medical History:  ?Diagnosis Date  ? AICD (automatic cardioverter/defibrillator) present   ? for sick sinus syndrome  ? Cirrhosis of liver ([place])   ? COPD (chronic obstructive pulmonary disease) ([place])   ? Coronary artery calcification seen on CAT scan 01/14/2017  ? Essential hypertension 09/11/2020  ? GERD (gastroesophageal reflux disease)   ? History of kidney stones   ? Hyperlipidemia   ? Hypothyroidism   ? OSA on CPAP   ? Mild with AHI 7.8/hr >>on CPAP  ? Osteoarthritis   ? Shingles 2012  ? ?Past Surgical History:  ?Procedure Laterality Date  ? CORONARY STENT INTERVENTION N/A 08/24/2020  ? Procedure: CORONARY STENT INTERVENTION;  Surgeon: [name], MD;  Location: [place] [facility];  Service: Cardiovascular;  Laterality: N/A;  ? HEEL SPUR SURGERY Left 80's  ? INTRAVASCULAR PRESSURE WIRE/FFR STUDY N/A 04/25/2021  ? Procedure: INTRAVASCULAR PRESSURE WIRE/FFR STUDY;  Surgeon: [name], MD;  Location: [place] [facility];  Service: Cardiovascular;  Laterality: N/A;  ? INTRAVASCULAR ULTRASOUND/IVUS N/A 08/24/2020  ? Procedure: Intravascular Ultrasound/IVUS;  Surgeon: [name], MD;  Location: [place] [facility];  Service: Cardiovascular;  Laterality: N/A;   ? KNEE ARTHROPLASTY Right 10/31/2021  ? Procedure: COMPUTER ASSISTED TOTAL KNEE ARTHROPLASTY;  Surgeon: Rod Can, MD;  [facility];  Service: Orthopedics;  Laterality: Right;  ? KNEE SURGERY Bilateral   ? numerous times  ? LEFT HEART CATH AND CORONARY ANGIOGRAPHY N/A 08/24/2020  ? Procedure: LEFT HEART CATH AND CORONARY ANGIOGRAPHY;  Surgeon: [name], MD;  Location: [place] [facility];  Service: Cardiovascular;  Laterality: N/A;  ? LEFT HEART CATH AND CORONARY ANGIOGRAPHY N/A 04/25/2021  ? Procedure: LEFT HEART CATH AND CORONARY ANGIOGRAPHY;  Surgeon: [name], MD;  Location: [place] [facility];  Service: Cardiovascular;  Laterality: N/A;  ? NECK SURGERY  70's  ? PACEMAKER IMPLANT N/A 12/04/2020  ? Procedure: PACEMAKER IMPLANT;  Surgeon: [name], MD;  Location: [place] [facility];  Service: Cardiovascular;  Laterality: N/A;  ? TOTAL HIP ARTHROPLASTY Left 03/09/2017  ? Procedure: LEFT TOTAL HIP ARTHROPLASTY ANTERIOR APPROACH;  Surgeon: Rod Can, MD;  Location: [place];  Service: Orthopedics;  Laterality: Left;  Dr. requesting RNFA  ? ?Patient Active Problem List  ? Diagnosis Date Noted  ? Gastroesophageal reflux disease 01/22/2022  ? Kidney stone 01/22/2022  ? Ultrasound scan abnormal 01/22/2022  ? Unspecified abnormal finding in specimens from other organs, systems and tissues 01/22/2022  ? Unspecified cirrhosis of liver ([place]) 01/22/2022  ? Acute CVA (cerebrovascular accident) ([place]) 11/20/2021  ?  Stiffness of right knee 11/04/2021  ? Osteoarthritis of right knee 10/31/2021  ? OSA on CPAP 08/26/2021  ? DOE (dyspnea on exertion) 04/04/2021  ? Anemia due to vitamin B12 deficiency 04/04/2021  ? Statin myopathy 09/18/2020  ? Coronary artery disease involving native coronary artery of native heart without angina pectoris 09/11/2020  ? Essential hypertension 09/11/2020  ? Hyperlipidemia 09/11/2020  ? Unstable angina ([place]) 08/24/2020  ? Aortic atherosclerosis ([place]) 07/31/2020  ?  Other pancytopenia ([place]) 03/29/2020  ? Bicytopenia 03/08/2020  ? Pain in right knee 11/22/2019  ? Cervical post-laminectomy syndrome 05/09/2019  ? DDD (degenerative disc disease), cervical 05/09/2019  ? Chronic neck pain 03/15/2019  ? Trigger finger of right hand 02/23/2018  ? Avascular necrosis of hip, left ([place]) 03/09/2017  ? Coronary artery disease with stable angina pectoris ([place]) 01/14/2017  ? ? ?REFERRING DIAG: I63.9 (ICD-10-CM) - Acute CVA (cerebrovascular accident) ([place]) ? ?THERAPY DIAG:  ?Other abnormalities of gait and mobility ? ?Muscle weakness (generalized) ? ?Unsteadiness on feet ? ?Stiffness of right knee, not elsewhere classified ? ?PERTINENT HISTORY: HLD, HTN, CAD, SSS s/p pacemaker in 11/2020, COPD, OSA on cpap. s/p right TKR 12/15.  ? ?PRECAUTIONS: Fall;ICD/Pacemaker  ? ?SUBJECTIVE: Pt states he was nauseous this morning with upset stomach.  His knee is doing better and he still intermittently takes tylenol and ices it, it mostly bothers him at night now.  Pt states that he has tried walking without AD at home and some with cane out onto his porch. ? ?PAIN:  ?Are you having pain? Yes ?NPRS scale: 1/10 ?Pain location: right knee ?Pain orientation: Right  ?PAIN TYPE: stiff, sore ?Pain description: intermittent  ?Aggravating factors: worse at night ?Relieving factors: movement and meds ? ? ?TODAY'S TREATMENT:  ?SciFit x70mn L5 BLE only to promote ROM and endurance ?PT assessed R knee ROM:  AROM 111 deg flexion, 0 deg extension; PROM flexion 111 deg ?Pt performs step to wide dots in semi circle with return to center b/w each tap using alt LE to facilitate weight shifting w/o use of UE support>inc challenge with order of colors>inc challenge by using taps to and across midline without return to center to challenge motor control and balance ?GAIT TRAINING: ?Gait pattern: step through pattern, decreased arm swing- Left, dec stride, dec stance on L,  ?Distance walked: 230' x2  ?Assistive device utilized:  Single point cane with quad tip and RW ?Level of assistance: CGA-SBA ?Comments: Pt ambulates 2 laps w/ RW in gym w/ verbal cues for hip flexor engagement on LLE.  Progressed to 2 laps w/ SPC w/ quad tip cued to prevent R trunk lean over cane.  Worked on inc gait speed w/ cane and head turn L & R.  No overt LOB using light CGA.  Used cane to navigate over 2" & 4" foam hurdles. ? ?  ? ? ?HOME EXERCISE PROGRAM: ?Access Code: [email or web address] ? ?SHORT TERM GOALS: ? ?Pt will improve gait speed to >/= 0.60 m/sec to demonstrate improved community ambulation ? ?Baseline: 0.31m. 12/31/21 0.2967m 01/21/22 0.48 m/s ?Target date:  02/21/22 ?Goal status: REVISED ? ?2.  Pt will improve Berg Balance to >/= 48/56 to demonstrate improved standing balance and reduced fall risk ? ?Baseline: 38/56, 45/56 ?Target date:  02/21/22 ?Goal status: REVISED ? ?3.  Pt will improve TUG to </= 18 secs to demonstrated reduced fall risk ? ?Baseline: 23.22 ?Target date:  02/21/22 ?Goal status: REVISED ? ?4.  Pt will be able to ambulate >/= 600  ft with LRAD vs no AD to demo improved household/community mobility ?Baseline: 575 with RW ?Target date:  02/21/22 ?Goal status: REVISED ? ? ?LONG TERM GOALS: ? ?Pt will be independent with progressive final HEP for strengthening, ROM and balance to continue gains on own. ?Baseline: will continue to benefit from progressive HEP ?Target date:  03/21/22 ?Goal status: IN PROGRESS ? ?2.  Pt will increase gait speed to >0.29ms for improved community mobility. ?Baseline: 12/03/21 0.373m ; 0.48 m/s w/ RW ?Target date:  03/21/22 ?Goal status: IN PROGRESS ? ?3.  Pt will ambulate >/= 500' on outdoor surfaces with LRAD versus no device for improved short community distances. ?Baseline: 575 ft indoors with RW ?Target date:  03/21/22 ?Goal status: REVISED ? ?4.  Pt will decrease TUG to </= 15 sec for improved balance and functional mobility.  ?Baseline: 12/03/21 36.95 sec with RW ; 23.22 secs with RW ?Target date:  03/21/22 ?Goal status:  REVISED ? ?5.  Pt will increase Berg to >/= 52/56 for improved balance and decreased fall risk.  ?Baseline: 38/56, 45/56 ?Target date:  03/21/22 ?Goal status: REVISED ? ?6.  Pt will ambulate up/down 4 steps with 1

## 2022-02-18 ENCOUNTER — Ambulatory Visit: Payer: Medicare Other | Attending: Internal Medicine | Admitting: Physical Therapy

## 2022-02-18 ENCOUNTER — Encounter: Payer: Self-pay | Admitting: Physical Therapy

## 2022-02-18 DIAGNOSIS — R2681 Unsteadiness on feet: Secondary | ICD-10-CM | POA: Diagnosis present

## 2022-02-18 DIAGNOSIS — M25661 Stiffness of right knee, not elsewhere classified: Secondary | ICD-10-CM | POA: Diagnosis present

## 2022-02-18 DIAGNOSIS — R2689 Other abnormalities of gait and mobility: Secondary | ICD-10-CM | POA: Insufficient documentation

## 2022-02-18 DIAGNOSIS — M6281 Muscle weakness (generalized): Secondary | ICD-10-CM | POA: Insufficient documentation

## 2022-02-18 NOTE — Therapy (Signed)
?OUTPATIENT PHYSICAL THERAPY TREATMENT NOTE ? ? ?Patient Name: Austin Oliver ?MRN: [id number] ?DOB:[birth year], 66 y.o., male ?Today's Date: 02/18/2022 ? ?PCP: [name], MD ?REFERRING PROVIDER: Dr. Leonie Man,  ? referral was Dr. Florencia Reasons (hospitalist)  ? ?   ? ? PT End of Session - 02/18/22 1108   ? ? Visit Number 15   ? Number of Visits 23   ? Date for PT Re-Evaluation 03/21/22   ? Authorization Type [employer] medicare so 10th visit progress note   ? Progress Note Due on Visit 10   ? PT Start Time 1105   ? PT Stop Time 1145   ? PT Time Calculation (min) 40 min   ? Equipment Utilized During Treatment Gait belt   ? Activity Tolerance Patient tolerated treatment well   ? Behavior During Therapy [facility] for tasks assessed/performed   ? ?  ?  ? ?  ? ? ?Past Medical History:  ?Diagnosis Date  ? AICD (automatic cardioverter/defibrillator) present   ? for sick sinus syndrome  ? Cirrhosis of liver ([place])   ? COPD (chronic obstructive pulmonary disease) ([place])   ? Coronary artery calcification seen on CAT scan 01/14/2017  ? Essential hypertension 09/11/2020  ? GERD (gastroesophageal reflux disease)   ? History of kidney stones   ? Hyperlipidemia   ? Hypothyroidism   ? OSA on CPAP   ? Mild with AHI 7.8/hr >>on CPAP  ? Osteoarthritis   ? Shingles 2012  ? ?Past Surgical History:  ?Procedure Laterality Date  ? CORONARY STENT INTERVENTION N/A 08/24/2020  ? Procedure: CORONARY STENT INTERVENTION;  Surgeon: [name], MD;  Location: [place] [facility];  Service: Cardiovascular;  Laterality: N/A;  ? HEEL SPUR SURGERY Left 80's  ? INTRAVASCULAR PRESSURE WIRE/FFR STUDY N/A 04/25/2021  ? Procedure: INTRAVASCULAR PRESSURE WIRE/FFR STUDY;  Surgeon: [name], MD;  Location: [place] [facility];  Service: Cardiovascular;  Laterality: N/A;  ? INTRAVASCULAR ULTRASOUND/IVUS N/A 08/24/2020  ? Procedure: Intravascular Ultrasound/IVUS;  Surgeon: [name], MD;  Location: [place] [facility];  Service: Cardiovascular;  Laterality: N/A;   ? KNEE ARTHROPLASTY Right 10/31/2021  ? Procedure: COMPUTER ASSISTED TOTAL KNEE ARTHROPLASTY;  Surgeon: Rod Can, MD;  [facility];  Service: Orthopedics;  Laterality: Right;  ? KNEE SURGERY Bilateral   ? numerous times  ? LEFT HEART CATH AND CORONARY ANGIOGRAPHY N/A 08/24/2020  ? Procedure: LEFT HEART CATH AND CORONARY ANGIOGRAPHY;  Surgeon: [name], MD;  Location: [place] [facility];  Service: Cardiovascular;  Laterality: N/A;  ? LEFT HEART CATH AND CORONARY ANGIOGRAPHY N/A 04/25/2021  ? Procedure: LEFT HEART CATH AND CORONARY ANGIOGRAPHY;  Surgeon: [name], MD;  Location: [place] [facility];  Service: Cardiovascular;  Laterality: N/A;  ? NECK SURGERY  70's  ? PACEMAKER IMPLANT N/A 12/04/2020  ? Procedure: PACEMAKER IMPLANT;  Surgeon: [name], MD;  Location: [place] [facility];  Service: Cardiovascular;  Laterality: N/A;  ? TOTAL HIP ARTHROPLASTY Left 03/09/2017  ? Procedure: LEFT TOTAL HIP ARTHROPLASTY ANTERIOR APPROACH;  Surgeon: Rod Can, MD;  Location: [place];  Service: Orthopedics;  Laterality: Left;  Dr. requesting RNFA  ? ?Patient Active Problem List  ? Diagnosis Date Noted  ? Gastroesophageal reflux disease 01/22/2022  ? Kidney stone 01/22/2022  ? Ultrasound scan abnormal 01/22/2022  ? Unspecified abnormal finding in specimens from other organs, systems and tissues 01/22/2022  ? Unspecified cirrhosis of liver ([place]) 01/22/2022  ? Acute CVA (cerebrovascular accident) ([place]) 11/20/2021  ?  Stiffness of right knee 11/04/2021  ? Osteoarthritis of right knee 10/31/2021  ? OSA on CPAP 08/26/2021  ? DOE (dyspnea on exertion) 04/04/2021  ? Anemia due to vitamin B12 deficiency 04/04/2021  ? Statin myopathy 09/18/2020  ? Coronary artery disease involving native coronary artery of native heart without angina pectoris 09/11/2020  ? Essential hypertension 09/11/2020  ? Hyperlipidemia 09/11/2020  ? Unstable angina ([place]) 08/24/2020  ? Aortic atherosclerosis ([place]) 07/31/2020  ?  Other pancytopenia ([place]) 03/29/2020  ? Bicytopenia 03/08/2020  ? Pain in right knee 11/22/2019  ? Cervical post-laminectomy syndrome 05/09/2019  ? DDD (degenerative disc disease), cervical 05/09/2019  ? Chronic neck pain 03/15/2019  ? Trigger finger of right hand 02/23/2018  ? Avascular necrosis of hip, left ([place]) 03/09/2017  ? Coronary artery disease with stable angina pectoris ([place]) 01/14/2017  ? ? ?REFERRING DIAG: I63.9 (ICD-10-CM) - Acute CVA (cerebrovascular accident) ([place]) ? ?THERAPY DIAG:  ?Other abnormalities of gait and mobility ? ?Muscle weakness (generalized) ? ?Unsteadiness on feet ? ?Stiffness of right knee, not elsewhere classified ? ?PERTINENT HISTORY: HLD, HTN, CAD, SSS s/p pacemaker in 11/2020, COPD, OSA on cpap. s/p right TKR 12/15.  ? ?PRECAUTIONS: Fall;ICD/Pacemaker  ? ?SUBJECTIVE: Pt states he is not feeling great today due to knees bothering him, right worse than left.  Pt states he doesn't know if it's muscular or something else.  He states it is sore around the top of the kneecap and through the joint itself.  When sitting he demos joint line tenderness. ? ?PAIN:  ?Are you having pain? Yes ?NPRS scale: 2/10 ?Pain location: right and left knee; R worse ?Pain orientation: Right and Left; Right jointline and suprapatellar ?PAIN TYPE: stiff, sore ?Pain description: intermittent  ?Aggravating factors: worse at night ?Relieving factors: movement and meds ? ? ?TODAY'S TREATMENT:  ?PT spent time assess patellar mobility, fibular head mobility, patellofemoral AROM/PROM, brief DVT assessment w/o s/s in RLE or LLE.  No visible decline in progress, normal mobility assessment.  PT spent significant time addressing pt concerns about R knee progression post-operatively and overall progress in therapy as well as pain management using PRICE principle.  Pt perseverating on R knee stiffness requiring redirection. ?SciFit x56mn L2.3 progressed to 5.5 BLE only to promote ROM and reciprocal movement due to pt  reported stiffness and discomfort today. ?Modified lunges forward cued for shallow depth with bilateral soft knee bend using counter support with Unilateral UE 2x8> sideways lunge using BUE support facing counter, cued for shallow depth and lateral weight shift with soft knee bend ?Use of bilateral railings to perform forward 6" step ups alt LE for functional strengthening progressed to 6" step ups from airex w/ and w/o UE support 3x8 each LE ? ?HOME EXERCISE PROGRAM: ?Access Code: [email or web address]? ? ?SHORT TERM GOALS: ? ?Pt will improve gait speed to >/= 0.60 m/sec to demonstrate improved community ambulation ? ?Baseline: 0.322m. 12/31/21 0.2925m 01/21/22 0.48 m/s ?Target date:  02/21/22 ?Goal status: REVISED ? ?2.  Pt will improve Berg Balance to >/= 48/56 to demonstrate improved standing balance and reduced fall risk ? ?Baseline: 38/56, 45/56 ?Target date:  02/21/22 ?Goal status: REVISED ? ?3.  Pt will improve TUG to </= 18 secs to demonstrated reduced fall risk ? ?Baseline: 23.22 ?Target date:  02/21/22 ?Goal status: REVISED ? ?4.  Pt will be able to ambulate >/= 600 ft with LRAD vs no AD to demo improved household/community mobility ?Baseline: 575 with RW ?Target date:  02/21/22 ?Goal status: REVISED ? ? ?  LONG TERM GOALS: ? ?Pt will be independent with progressive final HEP for strengthening, ROM and balance to continue gains on own. ?Baseline: will continue to benefit from progressive HEP ?Target date:  03/21/22 ?Goal status: IN PROGRESS ? ?2.  Pt will increase gait speed to >0.8ms for improved community mobility. ?Baseline: 12/03/21 0.38m ; 0.48 m/s w/ RW ?Target date:  03/21/22 ?Goal status: IN PROGRESS ? ?3.  Pt will ambulate >/= 500' on outdoor surfaces with LRAD versus no device for improved short community distances. ?Baseline: 575 ft indoors with RW ?Target date:  03/21/22 ?Goal status: REVISED ? ?4.  Pt will decrease TUG to </= 15 sec for improved balance and functional mobility.  ?Baseline: 12/03/21 36.95 sec with RW ;  23.22 secs with RW ?Target date:  03/21/22 ?Goal status: REVISED ? ?5.  Pt will increase Berg to >/= 52/56 for improved balance and decreased fall risk.  ?Baseline: 38/56, 45/56 ?Target date:  03/21/22 ?Goal status

## 2022-02-20 ENCOUNTER — Ambulatory Visit: Payer: Medicare Other

## 2022-02-20 DIAGNOSIS — M6281 Muscle weakness (generalized): Secondary | ICD-10-CM

## 2022-02-20 DIAGNOSIS — R2689 Other abnormalities of gait and mobility: Secondary | ICD-10-CM

## 2022-02-20 DIAGNOSIS — M25661 Stiffness of right knee, not elsewhere classified: Secondary | ICD-10-CM

## 2022-02-20 DIAGNOSIS — R2681 Unsteadiness on feet: Secondary | ICD-10-CM

## 2022-02-20 NOTE — Therapy (Signed)
?OUTPATIENT PHYSICAL THERAPY TREATMENT NOTE ? ? ?Patient Name: Austin Oliver ?MRN: [id number] ?DOB:[birth year], 66 y.o., male ?Today's Date: 02/20/2022 ? ?PCP: [name], MD ?REFERRING PROVIDER: Dr. Leonie Man,  ? referral was Dr. Florencia Reasons (hospitalist)  ? ?   ? ? PT End of Session - 02/20/22 1101   ? ? Visit Number 16   ? Number of Visits 23   ? Date for PT Re-Evaluation 03/21/22   ? Authorization Type [employer] medicare so 10th visit progress note   ? Progress Note Due on Visit 10   ? PT Start Time 1101   ? Equipment Utilized During Treatment Gait belt   ? Activity Tolerance Patient tolerated treatment well   ? Behavior During Therapy [facility] for tasks assessed/performed   ? ?  ?  ? ?  ? ? ?Past Medical History:  ?Diagnosis Date  ? AICD (automatic cardioverter/defibrillator) present   ? for sick sinus syndrome  ? Cirrhosis of liver ([place])   ? COPD (chronic obstructive pulmonary disease) ([place])   ? Coronary artery calcification seen on CAT scan 01/14/2017  ? Essential hypertension 09/11/2020  ? GERD (gastroesophageal reflux disease)   ? History of kidney stones   ? Hyperlipidemia   ? Hypothyroidism   ? OSA on CPAP   ? Mild with AHI 7.8/hr >>on CPAP  ? Osteoarthritis   ? Shingles 2012  ? ?Past Surgical History:  ?Procedure Laterality Date  ? CORONARY STENT INTERVENTION N/A 08/24/2020  ? Procedure: CORONARY STENT INTERVENTION;  Surgeon: [name], MD;  Location: [place] [facility];  Service: Cardiovascular;  Laterality: N/A;  ? HEEL SPUR SURGERY Left 80's  ? INTRAVASCULAR PRESSURE WIRE/FFR STUDY N/A 04/25/2021  ? Procedure: INTRAVASCULAR PRESSURE WIRE/FFR STUDY;  Surgeon: [name], MD;  Location: [place] [facility];  Service: Cardiovascular;  Laterality: N/A;  ? INTRAVASCULAR ULTRASOUND/IVUS N/A 08/24/2020  ? Procedure: Intravascular Ultrasound/IVUS;  Surgeon: [name], MD;  Location: [place] [facility];  Service: Cardiovascular;  Laterality: N/A;  ? KNEE ARTHROPLASTY Right 10/31/2021  ? Procedure: COMPUTER  ASSISTED TOTAL KNEE ARTHROPLASTY;  Surgeon: Rod Can, MD;  [facility];  Service: Orthopedics;  Laterality: Right;  ? KNEE SURGERY Bilateral   ? numerous times  ? LEFT HEART CATH AND CORONARY ANGIOGRAPHY N/A 08/24/2020  ? Procedure: LEFT HEART CATH AND CORONARY ANGIOGRAPHY;  Surgeon: [name], MD;  Location: [place] [facility];  Service: Cardiovascular;  Laterality: N/A;  ? LEFT HEART CATH AND CORONARY ANGIOGRAPHY N/A 04/25/2021  ? Procedure: LEFT HEART CATH AND CORONARY ANGIOGRAPHY;  Surgeon: [name], MD;  Location: [place] [facility];  Service: Cardiovascular;  Laterality: N/A;  ? NECK SURGERY  70's  ? PACEMAKER IMPLANT N/A 12/04/2020  ? Procedure: PACEMAKER IMPLANT;  Surgeon: [name], MD;  Location: [place] [facility];  Service: Cardiovascular;  Laterality: N/A;  ? TOTAL HIP ARTHROPLASTY Left 03/09/2017  ? Procedure: LEFT TOTAL HIP ARTHROPLASTY ANTERIOR APPROACH;  Surgeon: Rod Can, MD;  Location: [place];  Service: Orthopedics;  Laterality: Left;  Dr. requesting RNFA  ? ?Patient Active Problem List  ? Diagnosis Date Noted  ? Gastroesophageal reflux disease 01/22/2022  ? Kidney stone 01/22/2022  ? Ultrasound scan abnormal 01/22/2022  ? Unspecified abnormal finding in specimens from other organs, systems and tissues 01/22/2022  ? Unspecified cirrhosis of liver ([place]) 01/22/2022  ? Acute CVA (cerebrovascular accident) ([place]) 11/20/2021  ? Stiffness of right knee 11/04/2021  ? Osteoarthritis of right knee 10/31/2021  ? OSA  on CPAP 08/26/2021  ? DOE (dyspnea on exertion) 04/04/2021  ? Anemia due to vitamin B12 deficiency 04/04/2021  ? Statin myopathy 09/18/2020  ? Coronary artery disease involving native coronary artery of native heart without angina pectoris 09/11/2020  ? Essential hypertension 09/11/2020  ? Hyperlipidemia 09/11/2020  ? Unstable angina ([place]) 08/24/2020  ? Aortic atherosclerosis ([place]) 07/31/2020  ? Other pancytopenia ([place]) 03/29/2020  ? Bicytopenia 03/08/2020  ?  Pain in right knee 11/22/2019  ? Cervical post-laminectomy syndrome 05/09/2019  ? DDD (degenerative disc disease), cervical 05/09/2019  ? Chronic neck pain 03/15/2019  ? Trigger finger of right hand 02/23/2018  ? Avascular necrosis of hip, left ([place]) 03/09/2017  ? Coronary artery disease with stable angina pectoris ([place]) 01/14/2017  ? ? ?REFERRING DIAG: I63.9 (ICD-10-CM) - Acute CVA (cerebrovascular accident) ([place]) ? ?THERAPY DIAG:  ?Other abnormalities of gait and mobility ? ?Muscle weakness (generalized) ? ?Unsteadiness on feet ? ?Stiffness of right knee, not elsewhere classified ? ?PERTINENT HISTORY: HLD, HTN, CAD, SSS s/p pacemaker in 11/2020, COPD, OSA on cpap. s/p right TKR 12/15.  ? ?PRECAUTIONS: Fall;ICD/Pacemaker  ? ?SUBJECTIVE: Patient reports feeling pretty well today, feels like the knee is doing better today. Still have some soreness on the right medial joint line. Already rode the bike today to warm up.  ? ?PAIN:  ?Are you having pain? Yes ?NPRS scale: 1/10 ?Pain location: right knee ?Pain orientation: Right; Right jointline and suprapatellar ?PAIN TYPE: stiff, sore ?Pain description: intermittent  ?Aggravating factors: worse at night ?Relieving factors: movement and tylenol ? ? ?TODAY'S TREATMENT:  ?  TherEx:  ?Completed on Level 3.5 x 5 minutes with BLE/BUE working on improved BLE strengthening and ROM. Pt tolerating increase in resistance well.  ?   ?GAIT: ?Distance walked: 600 ft (outdoors on paved and grass surfaces), plus additional clinic distances with SPC ?Assistive device utilized: Single point cane and Environmental consultant - 2 wheeled; SPC with [employer]  ?Level of assistance: SBA and CGA ?Comments: mild unsteadiness noted on grass surfaces ?Gait Speed: 13.85 secs =2.37 ft/sec (0.72 ms/s) with use of SPC with quad tip ? ?Outcome Measures:  ?TUG = 16.34 secs with SPC with quad tip ? ? [facility] PT Assessment - 02/20/22 0001   ? ?  ? Standardized Balance Assessment  ? Standardized Balance Assessment Berg Balance  Test   ?  ? Berg Balance Test  ? Sit to Stand Able to stand without using hands and stabilize independently   ? Standing Unsupported Able to stand safely 2 minutes   ? Sitting with Back Unsupported but Feet Supported on Floor or Stool Able to sit safely and securely 2 minutes   ? Stand to Sit Sits safely with minimal use of hands   ? Transfers Able to transfer safely, minor use of hands   ? Standing Unsupported with Eyes Closed Able to stand 10 seconds safely   ? Standing Unsupported with Feet Together Able to place feet together independently and stand 1 minute safely   ? From Standing, Reach Forward with Outstretched Arm Can reach confidently >25 cm (10")   11"  ? From Standing Position, Pick up Object from [place] to pick up shoe safely and easily   ? From Standing Position, Turn to Look Behind Over each Shoulder Looks behind from both sides and weight shifts well   ? Turn 360 Degrees Able to turn 360 degrees safely one side only in 4 seconds or less   ? Standing Unsupported, Alternately Place Feet on  Step/Stool Able to stand independently and complete 8 steps >20 seconds   ? Standing Unsupported, One Foot in Front Able to take small step independently and hold 30 seconds   ? Standing on One Leg Able to lift leg independently and hold equal to or more than 3 seconds   ? Total Score 50   ? Merrilee Jansky comment: 50/56   ? ?  ?  ? ?  ? ? ? ?HOME EXERCISE PROGRAM: ?Access Code: [email or web address] ? ? ?SHORT TERM GOALS: ? ?Pt will improve gait speed to >/= 0.60 m/sec to demonstrate improved community ambulation ? ?Baseline: 0.89ms. 12/31/21 0.234m, 01/21/22 0.48 m/s; 0.72 m/s ?Target date:  02/21/22 ?Goal status: MET ? ?2.  Pt will improve Berg Balance to >/= 48/56 to demonstrate improved standing balance and reduced fall risk ? ?Baseline: 38/56, 45/56; 50/56 ?Target date:  02/21/22 ?Goal status: MET ? ?3.  Pt will improve TUG to </= 18 secs to demonstrated reduced fall risk ? ?Baseline: 23.22; 16.34 secs with SPC with quad tip ?Target  date:  02/21/22 ?Goal status: MET ? ?4.  Pt will be able to ambulate >/= 600 ft with LRAD vs no AD to demo improved household/community mobility ?Baseline: 575 with RW; 600 ft with SPC with quad tip CGA

## 2022-02-25 ENCOUNTER — Ambulatory Visit: Payer: Medicare Other | Admitting: Physical Therapy

## 2022-02-25 ENCOUNTER — Encounter: Payer: Self-pay | Admitting: Physical Therapy

## 2022-02-25 DIAGNOSIS — R2681 Unsteadiness on feet: Secondary | ICD-10-CM

## 2022-02-25 DIAGNOSIS — R2689 Other abnormalities of gait and mobility: Secondary | ICD-10-CM | POA: Diagnosis not present

## 2022-02-25 DIAGNOSIS — M25661 Stiffness of right knee, not elsewhere classified: Secondary | ICD-10-CM

## 2022-02-25 DIAGNOSIS — M6281 Muscle weakness (generalized): Secondary | ICD-10-CM

## 2022-02-25 NOTE — Therapy (Signed)
?OUTPATIENT PHYSICAL THERAPY TREATMENT NOTE ? ? ?Patient Name: Austin Oliver ?MRN: [id number] ?DOB:1956-05-15, 66 y.o., male ?Today's Date: 02/26/2022 ? ?PCP: [name], MD ?REFERRING PROVIDER: Dr. Leonie Man,  ? referral was Dr. Florencia Reasons (hospitalist)  ? ?   ? ? PT End of Session - 02/25/22 1104   ? ? Visit Number 17   ? Number of Visits 23   ? Date for PT Re-Evaluation 03/21/22   ? Authorization Type [employer] medicare so 10th visit progress note   ? Progress Note Due on Visit 10   ? PT Start Time 1102   ? PT Stop Time 1145   ? PT Time Calculation (min) 43 min   ? Equipment Utilized During Treatment Gait belt   ? Activity Tolerance Patient tolerated treatment well   ? Behavior During Therapy [facility] for tasks assessed/performed   ? ?  ?  ? ?  ? ? ?Past Medical History:  ?Diagnosis Date  ? AICD (automatic cardioverter/defibrillator) present   ? for sick sinus syndrome  ? Cirrhosis of liver ([place])   ? COPD (chronic obstructive pulmonary disease) ([place])   ? Coronary artery calcification seen on CAT scan 01/14/2017  ? Essential hypertension 09/11/2020  ? GERD (gastroesophageal reflux disease)   ? History of kidney stones   ? Hyperlipidemia   ? Hypothyroidism   ? OSA on CPAP   ? Mild with AHI 7.8/hr >>on CPAP  ? Osteoarthritis   ? Shingles 2012  ? ?Past Surgical History:  ?Procedure Laterality Date  ? CORONARY STENT INTERVENTION N/A 08/24/2020  ? Procedure: CORONARY STENT INTERVENTION;  Surgeon: [name], MD;  Location: [place] [facility];  Service: Cardiovascular;  Laterality: N/A;  ? HEEL SPUR SURGERY Left 80's  ? INTRAVASCULAR PRESSURE WIRE/FFR STUDY N/A 04/25/2021  ? Procedure: INTRAVASCULAR PRESSURE WIRE/FFR STUDY;  Surgeon: [name], MD;  Location: [place] [facility];  Service: Cardiovascular;  Laterality: N/A;  ? INTRAVASCULAR ULTRASOUND/IVUS N/A 08/24/2020  ? Procedure: Intravascular Ultrasound/IVUS;  Surgeon: [name], MD;  Location: [place] [facility];  Service: Cardiovascular;  Laterality: N/A;   ? KNEE ARTHROPLASTY Right 10/31/2021  ? Procedure: COMPUTER ASSISTED TOTAL KNEE ARTHROPLASTY;  Surgeon: Rod Can, MD;  Location: [facility];  Service: Orthopedics;  Laterality: Right;  ? KNEE SURGERY Bilateral   ? numerous times  ? LEFT HEART CATH AND CORONARY ANGIOGRAPHY N/A 08/24/2020  ? Procedure: LEFT HEART CATH AND CORONARY ANGIOGRAPHY;  Surgeon: [name], MD;  Location: [place] [facility];  Service: Cardiovascular;  Laterality: N/A;  ? LEFT HEART CATH AND CORONARY ANGIOGRAPHY N/A 04/25/2021  ? Procedure: LEFT HEART CATH AND CORONARY ANGIOGRAPHY;  Surgeon: [name], MD;  Location: [place] [facility];  Service: Cardiovascular;  Laterality: N/A;  ? NECK SURGERY  70's  ? PACEMAKER IMPLANT N/A 12/04/2020  ? Procedure: PACEMAKER IMPLANT;  Surgeon: [name], MD;  Location: [place] [facility];  Service: Cardiovascular;  Laterality: N/A;  ? TOTAL HIP ARTHROPLASTY Left 03/09/2017  ? Procedure: LEFT TOTAL HIP ARTHROPLASTY ANTERIOR APPROACH;  Surgeon: Rod Can, MD;  Location: [place];  Service: Orthopedics;  Laterality: Left;  Dr. requesting RNFA  ? ?Patient Active Problem List  ? Diagnosis Date Noted  ? Gastroesophageal reflux disease 01/22/2022  ? Kidney stone 01/22/2022  ? Ultrasound scan abnormal 01/22/2022  ? Unspecified abnormal finding in specimens from other organs, systems and tissues 01/22/2022  ? Unspecified cirrhosis of liver ([place]) 01/22/2022  ? Acute CVA (cerebrovascular accident) ([place]) 11/20/2021  ?  Stiffness of right knee 11/04/2021  ? Osteoarthritis of right knee 10/31/2021  ? OSA on CPAP 08/26/2021  ? DOE (dyspnea on exertion) 04/04/2021  ? Anemia due to vitamin B12 deficiency 04/04/2021  ? Statin myopathy 09/18/2020  ? Coronary artery disease involving native coronary artery of native heart without angina pectoris 09/11/2020  ? Essential hypertension 09/11/2020  ? Hyperlipidemia 09/11/2020  ? Unstable angina ([place]) 08/24/2020  ? Aortic atherosclerosis ([place]) 07/31/2020  ?  Other pancytopenia ([place]) 03/29/2020  ? Bicytopenia 03/08/2020  ? Pain in right knee 11/22/2019  ? Cervical post-laminectomy syndrome 05/09/2019  ? DDD (degenerative disc disease), cervical 05/09/2019  ? Chronic neck pain 03/15/2019  ? Trigger finger of right hand 02/23/2018  ? Avascular necrosis of hip, left ([place]) 03/09/2017  ? Coronary artery disease with stable angina pectoris ([place]) 01/14/2017  ? ? ?REFERRING DIAG: I63.9 (ICD-10-CM) - Acute CVA (cerebrovascular accident) ([place]) ? ?THERAPY DIAG:  ?Other abnormalities of gait and mobility ? ?Muscle weakness (generalized) ? ?Unsteadiness on feet ? ?Stiffness of right knee, not elsewhere classified ? ?PERTINENT HISTORY: HLD, HTN, CAD, SSS s/p pacemaker in 11/2020, COPD, OSA on cpap. s/p right TKR 12/15.  ? ?PRECAUTIONS: Fall;ICD/Pacemaker  ? ?SUBJECTIVE: He reports this is the first day he has woken up without pain in R knee.  He takes one tylenol before bed and is good.  He has been riding bike at home.  Using the single point cane some at home, interested in getting the quad tip.  ? ?PAIN:  ?Are you having pain? No ? ? ?TODAY'S TREATMENT:  ?  TherEx:  ?SciFit Level 3.7 x 6 minutes with BLE only for improved BLE strengthening and reciprocal ROM. PT increased resistance for added challenge with step goal of 75, pt maintaining high 70s throughout.  ?   ?Gait training completed in gym on level surface:  pt ambulates with SPC with quad tip on R side, cued for initiation of correct gait pattern which pt maintains without cuing.  Pt ambulates 115' + 230'  Pt ambulates up and down ramp using CGA with cuing for slowed speed on descent for safety using cane.  Progressed to 2 laps in gym with added red and blue mat x8' each to practice surface transitions, pt completes 2 additional bouts over red and blue mat surfaces.  ? ?Pt practices stepping over 4" hurdles spaced out over 8 feet x8 trials w/ alt UE support on countertop decreased to intermittent fingertip support with CGA.   Cued to prevent circumduction of BLEs, pt states he notes increased quad soreness on RLE during bending. ? ? ? ? ? ? ?HOME EXERCISE PROGRAM: ?Access Code: [email or web address] ? ? ?SHORT TERM GOALS: ? ?Pt will improve gait speed to >/= 0.60 m/sec to demonstrate improved community ambulation ? ?Baseline: 0.30ms. 12/31/21 0.281m, 01/21/22 0.48 m/s; 0.72 m/s ?Target date:  02/21/22 ?Goal status: MET ? ?2.  Pt will improve Berg Balance to >/= 48/56 to demonstrate improved standing balance and reduced fall risk ? ?Baseline: 38/56, 45/56; 50/56 ?Target date:  02/21/22 ?Goal status: MET ? ?3.  Pt will improve TUG to </= 18 secs to demonstrated reduced fall risk ? ?Baseline: 23.22; 16.34 secs with SPC with quad tip ?Target date:  02/21/22 ?Goal status: MET ? ?4.  Pt will be able to ambulate >/= 600 ft with LRAD vs no AD to demo improved household/community mobility ?Baseline: 575 with RW; 600 ft with SPC with quad tip CGA on grass surfaces ?Target date:  02/21/22 ?Goal  status: MET ? ? ?LONG TERM GOALS: ? ?Pt will be independent with progressive final HEP for strengthening, ROM and balance to continue gains on own. ?Baseline: will continue to benefit from progressive HEP ?Target date:  03/21/22 ?Goal status: IN PROGRESS ? ?2.  Pt will increase gait speed to >0.52ms for improved community mobility. ?Baseline: 12/03/21 0.352m ; 0.48 m/s w/ RW ?Target date:  03/21/22 ?Goal status: IN PROGRESS ? ?3.  Pt will ambulate >/= 500' on outdoor surfaces with LRAD versus no device for improved short community distances. ?Baseline: 575 ft indoors with RW ?Target date:  03/21/22 ?Goal status: REVISED ? ?4.  Pt will decrease TUG to </= 15 sec for improved balance and functional mobility.  ?Baseline: 12/03/21 36.95 sec with RW ; 23.22 secs with RW ?Target date:  03/21/22 ?Goal status: REVISED ? ?5.  Pt will increase Berg to >/= 52/56 for improved balance and decreased fall risk.  ?Baseline: 38/56, 45/56 ?Target date:  03/21/22 ?Goal status: REVISED ? ?6.  Pt will ambulate  up/down 4 steps with 1 rail mod I for improved community access.  ?Baseline: bil rails step to pattern  ?Target date:  03/21/22 ?Goal status: IN PROGRESS ? ?7.  FOTO will increase from 65 to 77.  ?Baseline

## 2022-02-27 ENCOUNTER — Ambulatory Visit: Payer: Medicare Other | Admitting: Physical Therapy

## 2022-02-27 ENCOUNTER — Encounter: Payer: Self-pay | Admitting: Physical Therapy

## 2022-02-27 DIAGNOSIS — R2689 Other abnormalities of gait and mobility: Secondary | ICD-10-CM

## 2022-02-27 DIAGNOSIS — M6281 Muscle weakness (generalized): Secondary | ICD-10-CM

## 2022-02-27 DIAGNOSIS — M25661 Stiffness of right knee, not elsewhere classified: Secondary | ICD-10-CM

## 2022-02-27 DIAGNOSIS — R2681 Unsteadiness on feet: Secondary | ICD-10-CM

## 2022-02-27 NOTE — Therapy (Signed)
?OUTPATIENT PHYSICAL THERAPY TREATMENT NOTE ? ? ?Patient Name: Austin Oliver ?MRN: [id number] ?DOB:1956-03-12, 66 y.o., male ?Today's Date: 02/27/2022 ? ?PCP: [name], MD ?REFERRING PROVIDER: Dr. Leonie Man,  ? referral was Dr. Florencia Reasons (hospitalist)  ? ?   ? ? PT End of Session - 02/27/22 1102   ? ? Visit Number 18   ? Number of Visits 23   ? Date for PT Re-Evaluation 03/21/22   ? Authorization Type [employer] medicare so 10th visit progress note   ? Progress Note Due on Visit 10   ? PT Start Time 1100   ? PT Stop Time 4562   ? PT Time Calculation (min) 42 min   ? Equipment Utilized During Treatment Gait belt   ? Activity Tolerance Patient tolerated treatment well   ? Behavior During Therapy [facility] for tasks assessed/performed   ? ?  ?  ? ?  ? ? ?Past Medical History:  ?Diagnosis Date  ? AICD (automatic cardioverter/defibrillator) present   ? for sick sinus syndrome  ? Cirrhosis of liver ([place])   ? COPD (chronic obstructive pulmonary disease) ([place])   ? Coronary artery calcification seen on CAT scan 01/14/2017  ? Essential hypertension 09/11/2020  ? GERD (gastroesophageal reflux disease)   ? History of kidney stones   ? Hyperlipidemia   ? Hypothyroidism   ? OSA on CPAP   ? Mild with AHI 7.8/hr >>on CPAP  ? Osteoarthritis   ? Shingles 2012  ? ?Past Surgical History:  ?Procedure Laterality Date  ? CORONARY STENT INTERVENTION N/A 08/24/2020  ? Procedure: CORONARY STENT INTERVENTION;  Surgeon: [name], MD;  Location: [place] [facility];  Service: Cardiovascular;  Laterality: N/A;  ? HEEL SPUR SURGERY Left 80's  ? INTRAVASCULAR PRESSURE WIRE/FFR STUDY N/A 04/25/2021  ? Procedure: INTRAVASCULAR PRESSURE WIRE/FFR STUDY;  Surgeon: [name], MD;  Location: [place] [facility];  Service: Cardiovascular;  Laterality: N/A;  ? INTRAVASCULAR ULTRASOUND/IVUS N/A 08/24/2020  ? Procedure: Intravascular Ultrasound/IVUS;  Surgeon: [name], MD;  Location: [place] [facility];  Service: Cardiovascular;  Laterality: N/A;   ? KNEE ARTHROPLASTY Right 10/31/2021  ? Procedure: COMPUTER ASSISTED TOTAL KNEE ARTHROPLASTY;  Surgeon: Rod Can, MD;  [facility];  Service: Orthopedics;  Laterality: Right;  ? KNEE SURGERY Bilateral   ? numerous times  ? LEFT HEART CATH AND CORONARY ANGIOGRAPHY N/A 08/24/2020  ? Procedure: LEFT HEART CATH AND CORONARY ANGIOGRAPHY;  Surgeon: [name], MD;  Location: [place] [facility];  Service: Cardiovascular;  Laterality: N/A;  ? LEFT HEART CATH AND CORONARY ANGIOGRAPHY N/A 04/25/2021  ? Procedure: LEFT HEART CATH AND CORONARY ANGIOGRAPHY;  Surgeon: [name], MD;  Location: [place] [facility];  Service: Cardiovascular;  Laterality: N/A;  ? NECK SURGERY  70's  ? PACEMAKER IMPLANT N/A 12/04/2020  ? Procedure: PACEMAKER IMPLANT;  Surgeon: [name], MD;  Location: [place] [facility];  Service: Cardiovascular;  Laterality: N/A;  ? TOTAL HIP ARTHROPLASTY Left 03/09/2017  ? Procedure: LEFT TOTAL HIP ARTHROPLASTY ANTERIOR APPROACH;  Surgeon: Rod Can, MD;  Location: [place];  Service: Orthopedics;  Laterality: Left;  Dr. requesting RNFA  ? ?Patient Active Problem List  ? Diagnosis Date Noted  ? Gastroesophageal reflux disease 01/22/2022  ? Kidney stone 01/22/2022  ? Ultrasound scan abnormal 01/22/2022  ? Unspecified abnormal finding in specimens from other organs, systems and tissues 01/22/2022  ? Unspecified cirrhosis of liver ([place]) 01/22/2022  ? Acute CVA (cerebrovascular accident) ([place]) 11/20/2021  ?  Stiffness of right knee 11/04/2021  ? Osteoarthritis of right knee 10/31/2021  ? OSA on CPAP 08/26/2021  ? DOE (dyspnea on exertion) 04/04/2021  ? Anemia due to vitamin B12 deficiency 04/04/2021  ? Statin myopathy 09/18/2020  ? Coronary artery disease involving native coronary artery of native heart without angina pectoris 09/11/2020  ? Essential hypertension 09/11/2020  ? Hyperlipidemia 09/11/2020  ? Unstable angina ([place]) 08/24/2020  ? Aortic atherosclerosis ([place]) 07/31/2020  ?  Other pancytopenia ([place]) 03/29/2020  ? Bicytopenia 03/08/2020  ? Pain in right knee 11/22/2019  ? Cervical post-laminectomy syndrome 05/09/2019  ? DDD (degenerative disc disease), cervical 05/09/2019  ? Chronic neck pain 03/15/2019  ? Trigger finger of right hand 02/23/2018  ? Avascular necrosis of hip, left ([place]) 03/09/2017  ? Coronary artery disease with stable angina pectoris ([place]) 01/14/2017  ? ? ?REFERRING DIAG: I63.9 (ICD-10-CM) - Acute CVA (cerebrovascular accident) ([place]) ? ?THERAPY DIAG:  ?Other abnormalities of gait and mobility ? ?Muscle weakness (generalized) ? ?Unsteadiness on feet ? ?Stiffness of right knee, not elsewhere classified ? ?PERTINENT HISTORY: HLD, HTN, CAD, SSS s/p pacemaker in 11/2020, COPD, OSA on cpap. s/p right TKR 12/15.  ? ?PRECAUTIONS: Fall;ICD/Pacemaker  ? ?SUBJECTIVE: Pt ambulates into clinic using [facility] stating he was walking outside yesterday on the sidewalk and breezeway at his home.  He states his medial R knee is hurting more than his lateral R knee.  Pt already did 30 mins on his own stepper this morning.  ? ?PAIN:  ?Are you having pain? Yes ?1/10 ?soreness ?Tylenol helps ? ? ?TODAY'S TREATMENT:  ?  TherEx:  ?SciFit Level 5.0 x 6 minutes with BLE only for improved BLE strengthening and reciprocal ROM. PT increased resistance for added challenge with  pt maintaining low 60s throughout.  Modified RPE 6/10 ?Side lunging with BUE fingertip support on counter w/ inc ROM with inc reps 2x10 ?Stair negotiation using SPC w/ quad tip 3x4 6" steps in RUE.  Pt able to ascend with step to gait using cane w/o rail leading with the LLE, he cannot perform with the RLE leading, attempted x1 step w/ anterior LOB and inc knee pain.  He is able to descend with RLE leading w/ cuing but demos inc instability requiring minA to maintain weight forward and prevent posterior LOB.   ?   ? ?Gait training completed outside:  pt ambulates 120' + 25' forward on grass with SPC w/ quad tip, progressed to  backwards walking w/ cane x15', sideways stepping w/ cane x20', pt requires SBA-CGA and cuing for proper use of cane on unlevel surface as he tends to put too much weight through cane or not touch the ground with the cane entirely causing intermittent stumbling w/ minA to recover. ? ? ? EDU: ?Edu on cane purchase options for quad tip.  Edu on safety with stairs and unlevel surfaces if using cane as pt is already doing so.  Edu on using RW when fatigue or inc pain present and having supervision to light assist if managing stairs. ? ? ? ?HOME EXERCISE PROGRAM: ?Access Code: [email or web address] ? ? ?SHORT TERM GOALS: ? ?Pt will improve gait speed to >/= 0.60 m/sec to demonstrate improved community ambulation ? ?Baseline: 0.67ms. 12/31/21 0.237m, 01/21/22 0.48 m/s; 0.72 m/s ?Target date:  02/21/22 ?Goal status: MET ? ?2.  Pt will improve Berg Balance to >/= 48/56 to demonstrate improved standing balance and reduced fall risk ? ?Baseline: 38/56, 45/56; 50/56 ?Target date:  02/21/22 ?Goal status: MET ? ?  3.  Pt will improve TUG to </= 18 secs to demonstrated reduced fall risk ? ?Baseline: 23.22; 16.34 secs with SPC with quad tip ?Target date:  02/21/22 ?Goal status: MET ? ?4.  Pt will be able to ambulate >/= 600 ft with LRAD vs no AD to demo improved household/community mobility ?Baseline: 575 with RW; 600 ft with SPC with quad tip CGA on grass surfaces ?Target date:  02/21/22 ?Goal status: MET ? ? ?LONG TERM GOALS: ? ?Pt will be independent with progressive final HEP for strengthening, ROM and balance to continue gains on own. ?Baseline: will continue to benefit from progressive HEP ?Target date:  03/21/22 ?Goal status: IN PROGRESS ? ?2.  Pt will increase gait speed to >0.7m/s for improved community mobility. ?Baseline: 12/03/21 0.33m/s ; 0.48 m/s w/ RW ?Target date:  03/21/22 ?Goal status: IN PROGRESS ? ?3.  Pt will ambulate >/= 500' on outdoor surfaces with LRAD versus no device for improved short community distances. ?Baseline: 575 ft indoors  with RW ?Target date:  03/21/22 ?Goal status: REVISED ? ?4.  Pt will decrease TUG to </= 15 sec for improved balance and functional mobility.  ?Baseline: 12/03/21 36.95 sec with RW ; 23.22 secs with RW ?Target dat

## 2022-03-04 ENCOUNTER — Ambulatory Visit: Payer: Medicare Other

## 2022-03-04 DIAGNOSIS — M6281 Muscle weakness (generalized): Secondary | ICD-10-CM

## 2022-03-04 DIAGNOSIS — R2681 Unsteadiness on feet: Secondary | ICD-10-CM

## 2022-03-04 DIAGNOSIS — M25661 Stiffness of right knee, not elsewhere classified: Secondary | ICD-10-CM

## 2022-03-04 DIAGNOSIS — R2689 Other abnormalities of gait and mobility: Secondary | ICD-10-CM

## 2022-03-04 NOTE — Therapy (Signed)
?OUTPATIENT PHYSICAL THERAPY TREATMENT NOTE ? ? ?Patient Name: Austin Oliver ?MRN: [id number] ?DOB:1956-01-31, 66 y.o., male ?Today's Date: 03/04/2022 ? ?PCP: [name], MD ?REFERRING PROVIDER: Dr. Leonie Man,  ? referral was Dr. Florencia Reasons (hospitalist)  ? ?   ? ? PT End of Session - 03/04/22 1016   ? ? Visit Number 19   ? Number of Visits 23   ? Date for PT Re-Evaluation 03/21/22   ? Authorization Type [employer] medicare so 10th visit progress note   ? Progress Note Due on Visit 10   ? PT Start Time 1016   ? PT Stop Time 7989   ? PT Time Calculation (min) 37 min   ? Equipment Utilized During Treatment Gait belt   ? Activity Tolerance Patient tolerated treatment well   ? Behavior During Therapy [facility] for tasks assessed/performed   ? ?  ?  ? ?  ? ? ?Past Medical History:  ?Diagnosis Date  ? AICD (automatic cardioverter/defibrillator) present   ? for sick sinus syndrome  ? Cirrhosis of liver ([place])   ? COPD (chronic obstructive pulmonary disease) ([place])   ? Coronary artery calcification seen on CAT scan 01/14/2017  ? Essential hypertension 09/11/2020  ? GERD (gastroesophageal reflux disease)   ? History of kidney stones   ? Hyperlipidemia   ? Hypothyroidism   ? OSA on CPAP   ? Mild with AHI 7.8/hr >>on CPAP  ? Osteoarthritis   ? Shingles 2012  ? ?Past Surgical History:  ?Procedure Laterality Date  ? CORONARY STENT INTERVENTION N/A 08/24/2020  ? Procedure: CORONARY STENT INTERVENTION;  Surgeon: [name], MD;  Location: [place] [facility];  Service: Cardiovascular;  Laterality: N/A;  ? HEEL SPUR SURGERY Left 80's  ? INTRAVASCULAR PRESSURE WIRE/FFR STUDY N/A 04/25/2021  ? Procedure: INTRAVASCULAR PRESSURE WIRE/FFR STUDY;  Surgeon: [name], MD;  Location: [place] [facility];  Service: Cardiovascular;  Laterality: N/A;  ? INTRAVASCULAR ULTRASOUND/IVUS N/A 08/24/2020  ? Procedure: Intravascular Ultrasound/IVUS;  Surgeon: [name], MD;  Location: [place] [facility];  Service: Cardiovascular;  Laterality: N/A;   ? KNEE ARTHROPLASTY Right 10/31/2021  ? Procedure: COMPUTER ASSISTED TOTAL KNEE ARTHROPLASTY;  Surgeon: Rod Can, MD;  Location: [facility];  Service: Orthopedics;  Laterality: Right;  ? KNEE SURGERY Bilateral   ? numerous times  ? LEFT HEART CATH AND CORONARY ANGIOGRAPHY N/A 08/24/2020  ? Procedure: LEFT HEART CATH AND CORONARY ANGIOGRAPHY;  Surgeon: [name], MD;  Location: [place] [facility];  Service: Cardiovascular;  Laterality: N/A;  ? LEFT HEART CATH AND CORONARY ANGIOGRAPHY N/A 04/25/2021  ? Procedure: LEFT HEART CATH AND CORONARY ANGIOGRAPHY;  Surgeon: [name], MD;  Location: [place] [facility];  Service: Cardiovascular;  Laterality: N/A;  ? NECK SURGERY  70's  ? PACEMAKER IMPLANT N/A 12/04/2020  ? Procedure: PACEMAKER IMPLANT;  Surgeon: [name], MD;  Location: [place] [facility];  Service: Cardiovascular;  Laterality: N/A;  ? TOTAL HIP ARTHROPLASTY Left 03/09/2017  ? Procedure: LEFT TOTAL HIP ARTHROPLASTY ANTERIOR APPROACH;  Surgeon: Rod Can, MD;  Location: [place];  Service: Orthopedics;  Laterality: Left;  Dr. requesting RNFA  ? ?Patient Active Problem List  ? Diagnosis Date Noted  ? Gastroesophageal reflux disease 01/22/2022  ? Kidney stone 01/22/2022  ? Ultrasound scan abnormal 01/22/2022  ? Unspecified abnormal finding in specimens from other organs, systems and tissues 01/22/2022  ? Unspecified cirrhosis of liver ([place]) 01/22/2022  ? Acute CVA (cerebrovascular accident) ([place]) 11/20/2021  ?  Stiffness of right knee 11/04/2021  ? Osteoarthritis of right knee 10/31/2021  ? OSA on CPAP 08/26/2021  ? DOE (dyspnea on exertion) 04/04/2021  ? Anemia due to vitamin B12 deficiency 04/04/2021  ? Statin myopathy 09/18/2020  ? Coronary artery disease involving native coronary artery of native heart without angina pectoris 09/11/2020  ? Essential hypertension 09/11/2020  ? Hyperlipidemia 09/11/2020  ? Unstable angina ([place]) 08/24/2020  ? Aortic atherosclerosis ([place]) 07/31/2020  ?  Other pancytopenia ([place]) 03/29/2020  ? Bicytopenia 03/08/2020  ? Pain in right knee 11/22/2019  ? Cervical post-laminectomy syndrome 05/09/2019  ? DDD (degenerative disc disease), cervical 05/09/2019  ? Chronic neck pain 03/15/2019  ? Trigger finger of right hand 02/23/2018  ? Avascular necrosis of hip, left ([place]) 03/09/2017  ? Coronary artery disease with stable angina pectoris ([place]) 01/14/2017  ? ? ?REFERRING DIAG: I63.9 (ICD-10-CM) - Acute CVA (cerebrovascular accident) ([place]) ? ?THERAPY DIAG:  ?Other abnormalities of gait and mobility ? ?Unsteadiness on feet ? ?Stiffness of right knee, not elsewhere classified ? ?Muscle weakness (generalized) ? ?PERTINENT HISTORY: HLD, HTN, CAD, SSS s/p pacemaker in 11/2020, COPD, OSA on cpap. s/p right TKR 12/15.  ? ?PRECAUTIONS: Fall;ICD/Pacemaker  ? ?SUBJECTIVE: Patient reports had some soreness/pain in the right knee yesterday and has lingered into today. Not sure why. Wife reports was on it a lot over the weekend. Ambulating into session with SPC. No other new changes/complaints.  ? ?PAIN:  ?Are you having pain? Yes ?R Knee; 2/10 ?Soreness/Stiffness ? ? ? ?TODAY'S TREATMENT:  ?  TherEx:  ?Completed SciFit Level 5.0 x 6 minutes with BLE only for improved BLE ROM and strengthening. Patient tolerating resistance well, and able to maintain pace >/= 60 steps per minute. RPE 8/10 at end of completion. No increase in pain, reports improvements in stiffness.  ? ?Completed squat touch down to mat without UE support, completed x 10 reps with cues for control with descent and power backing up into standing.  ? ?Heel Raises: BLE; Position: standing , Sets: 1, Reps: 15, Weight/Theraband: with 3# ankle weights on BLE, light UE support required ?Hip Abduction: BLE; Position: standing with light UE support , Sets: 1, Reps: 10 bilaterally, Weight/Theraband: 3# ankle weights ?Hip Extension: BLE; Position: standing with light UE support , Sets: 1, Reps: 10, Weight/Theraband: 3# ankle  weights ?Hamstring Curl: BLE; Position: standing with light UE support , Sets: 1, Reps 10, Weight/Theraband: 3# ankle weights ? ? ?BP after activities: 122/74. Patient requesting to end session early due to fatigue and upset stomach, due to recent GI sickness. Vitals stable.  ? ? ? ?HOME EXERCISE PROGRAM: ?Access Code: [email or web address] ? ? ?SHORT TERM GOALS: ? ?Pt will improve gait speed to >/= 0.60 m/sec to demonstrate improved community ambulation ? ?Baseline: 0.69ms. 12/31/21 0.234m, 01/21/22 0.48 m/s; 0.72 m/s ?Target date:  02/21/22 ?Goal status: MET ? ?2.  Pt will improve Berg Balance to >/= 48/56 to demonstrate improved standing balance and reduced fall risk ? ?Baseline: 38/56, 45/56; 50/56 ?Target date:  02/21/22 ?Goal status: MET ? ?3.  Pt will improve TUG to </= 18 secs to demonstrated reduced fall risk ? ?Baseline: 23.22; 16.34 secs with SPC with quad tip ?Target date:  02/21/22 ?Goal status: MET ? ?4.  Pt will be able to ambulate >/= 600 ft with LRAD vs no AD to demo improved household/community mobility ?Baseline: 575 with RW; 600 ft with SPC with quad tip CGA on grass surfaces ?Target date:  02/21/22 ?Goal status: MET ? ? ?  LONG TERM GOALS: ? ?Pt will be independent with progressive final HEP for strengthening, ROM and balance to continue gains on own. ?Baseline: will continue to benefit from progressive HEP ?Target date:  03/21/22 ?Goal status: IN PROGRESS ? ?2.  Pt will increase gait speed to >0.76ms for improved community mobility. ?Baseline: 12/03/21 0.374m ; 0.48 m/s w/ RW ?Target date:  03/21/22 ?Goal status: IN PROGRESS ? ?3.  Pt will ambulate >/= 500' on outdoor surfaces with LRAD versus no device for improved short community distances. ?Baseline: 575 ft indoors with RW ?Target date:  03/21/22 ?Goal status: REVISED ? ?4.  Pt will decrease TUG to </= 15 sec for improved balance and functional mobility.  ?Baseline: 12/03/21 36.95 sec with RW ; 23.22 secs with RW ?Target date:  03/21/22 ?Goal status: REVISED ? ?5.  Pt will  increase Berg to >/= 52/56 for improved balance and decreased fall risk.  ?Baseline: 38/56, 45/56 ?Target date:  03/21/22 ?Goal status: REVISED ? ?6.  Pt will ambulate up/down 4 steps with 1 rail mod I for improved

## 2022-03-05 ENCOUNTER — Ambulatory Visit (INDEPENDENT_AMBULATORY_CARE_PROVIDER_SITE_OTHER): Payer: Medicare Other

## 2022-03-05 ENCOUNTER — Encounter: Payer: Self-pay | Admitting: Physician Assistant

## 2022-03-05 ENCOUNTER — Ambulatory Visit (INDEPENDENT_AMBULATORY_CARE_PROVIDER_SITE_OTHER): Payer: Medicare Other | Admitting: Physician Assistant

## 2022-03-05 VITALS — BP 102/62 | HR 60 | Ht 69.0 in | Wt 211.2 lb

## 2022-03-05 DIAGNOSIS — Z950 Presence of cardiac pacemaker: Secondary | ICD-10-CM

## 2022-03-05 DIAGNOSIS — I10 Essential (primary) hypertension: Secondary | ICD-10-CM | POA: Diagnosis not present

## 2022-03-05 DIAGNOSIS — I495 Sick sinus syndrome: Secondary | ICD-10-CM

## 2022-03-05 DIAGNOSIS — E782 Mixed hyperlipidemia: Secondary | ICD-10-CM

## 2022-03-05 DIAGNOSIS — I700 Atherosclerosis of aorta: Secondary | ICD-10-CM

## 2022-03-05 DIAGNOSIS — Z8673 Personal history of transient ischemic attack (TIA), and cerebral infarction without residual deficits: Secondary | ICD-10-CM | POA: Diagnosis not present

## 2022-03-05 DIAGNOSIS — I25118 Atherosclerotic heart disease of native coronary artery with other forms of angina pectoris: Secondary | ICD-10-CM

## 2022-03-05 DIAGNOSIS — J449 Chronic obstructive pulmonary disease, unspecified: Secondary | ICD-10-CM | POA: Insufficient documentation

## 2022-03-05 LAB — CUP PACEART REMOTE DEVICE CHECK
Battery Remaining Longevity: 91 mo
Battery Remaining Percentage: 90 %
Battery Voltage: 3.01 V
Brady Statistic AP VP Percent: 1 %
Brady Statistic AP VS Percent: 86 %
Brady Statistic AS VP Percent: 1 %
Brady Statistic AS VS Percent: 14 %
Brady Statistic RA Percent Paced: 86 %
Brady Statistic RV Percent Paced: 1 %
Date Time Interrogation Session: 20230419020014
Implantable Lead Implant Date: 20220118
Implantable Lead Implant Date: 20220118
Implantable Lead Location: 753859
Implantable Lead Location: 753860
Implantable Pulse Generator Implant Date: 20220118
Lead Channel Impedance Value: 440 Ohm
Lead Channel Impedance Value: 450 Ohm
Lead Channel Pacing Threshold Amplitude: 1 V
Lead Channel Pacing Threshold Amplitude: 1 V
Lead Channel Pacing Threshold Pulse Width: 0.5 ms
Lead Channel Pacing Threshold Pulse Width: 0.8 ms
Lead Channel Sensing Intrinsic Amplitude: 2.1 mV
Lead Channel Sensing Intrinsic Amplitude: 7.6 mV
Lead Channel Setting Pacing Amplitude: 2 V
Lead Channel Setting Pacing Amplitude: 2.5 V
Lead Channel Setting Pacing Pulse Width: 0.5 ms
Lead Channel Setting Sensing Sensitivity: 2 mV
Pulse Gen Model: 2272
Pulse Gen Serial Number: 3891376

## 2022-03-05 NOTE — Assessment & Plan Note (Signed)
Continue aspirin, statin.  

## 2022-03-05 NOTE — Assessment & Plan Note (Signed)
History of DES to the LAD in November 2021.  Cardiac catheterization in June 2022 demonstrated patent stent LAD.  He does have moderate nonobstructive disease in the mid LAD, third diagonal and distal RCA which is managed medically.  He is doing well without anginal symptoms on his current medical regimen.  Continue aspirin 81 mg daily, Plavix 95 mg daily, isosorbide mononitrate 120 mg daily, ranolazine 1000 mg twice daily, Crestor 5 mg daily.  Follow-up 6 months. ?

## 2022-03-05 NOTE — Progress Notes (Signed)
?Cardiology Office Note:   ? ?Date:  03/05/2022  ? ?ID:  Austin Oliver, DOB 01/07/1956, MRN [id number] ? ?PCP:  Austin Downing, MD  ?[facility] HeartCare Providers ?Cardiologist:  Austin Lean, MD ?Electrophysiologist:  Austin Meredith Leeds, MD  ?Sleep Medicine:  Austin Him, MD    ?Referring MD: Austin Oliver, *  ? ?Chief Complaint:  Follow-up for CAD ?  ? 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 ?Prior CV Studies: ?Echocardiogram 11/20/2021 ?EF 50-55, no RWMA, mild LVH, normal RVSF, mild LAE, trivial MR, AV sclerosis w/o AS ? ?Cardiac catheterization 04/25/21 ?LM distal 20 ?LAD stent patent, mid 69, 60 (RFR = 0.91); D3 60 (RFR = 0.94) ?LCx patent; OM2 40 ?RCA moderate diffuse disease throughout, distal 70 (RFR = 0.97) ?LVEDP 15-20, EF 50-55 ? ?Carotid US 09/27/2020 ?Bilateral ICA 1-39 ? ?Cardiac catheterization 08/24/2020 ?PCI: 3 x 12 mm resolute Onyx DES to the ostial/proximal LAD ? ?Myoview 08/06/2020 ?No infarct or ischemia, EF 57; low risk ? ?Holter monitor 01/20/2017 ?Sinus bradycardia, rare PVCs/PACs ?  ? ?History of Present Illness:   ?Austin Oliver is a 66 y.o. male with the above problem list.  He was last seen in Jan 2023.   He is here today with his wife.  Overall, he is doing well.  His blood pressures typically run 250 systolic at home.  He does occasionally have issues with diarrhea.  His blood pressure tends to run low at those times.  He is continue to work with physical therapy.  He does not have exertional  chest pain or significant shortness of breath.  He sleeps on an incline chronically.  He has not had syncope, leg edema.   He does note some right-sided lower chest discomfort shortly after laying down at night.  This resolves after several seconds. ?   ?Past Medical History:  ?Diagnosis Date  ? AICD (automatic cardioverter/defibrillator) present   ? for sick sinus syndrome  ? Cirrhosis of liver ([place])   ? COPD (chronic obstructive pulmonary disease) ([place])   ? Coronary artery calcification seen on CAT scan 01/14/2017  ? Essential hypertension 09/11/2020  ? GERD (gastroesophageal reflux disease)   ? History of kidney stones   ? Hyperlipidemia   ? Hypothyroidism   ? OSA on CPAP   ? Mild with AHI 7.8/hr >>on CPAP  ? Osteoarthritis   ? Shingles 2012  ? ?Current Medications: ?Current Meds  ?Medication Sig  ? acetaminophen (TYLENOL) 325 MG tablet Take 1-2 tablets (325-650 mg total) by mouth every 6 (six) hours as needed for mild pain (pain score 1-3 or temp > 100.5).  ? albuterol (VENTOLIN HFA) 108 (90 Base) MCG/ACT inhaler Inhale 2 puffs into the lungs every 6 (six) hours as needed for shortness of breath or wheezing.  ? aspirin EC 81 MG tablet Take 81 mg by mouth daily. Swallow whole.  ? blood glucose meter kit and supplies KIT Dispense based on patient and insurance preference. Use up to four times daily as directed.  ? cholecalciferol (  VITAMIN D3) 25 MCG (1000 UNIT) tablet Take 1,000 Units by mouth daily.  ? clopidogrel (PLAVIX) 75 MG tablet Take 1 tablet (75 mg total) by mouth daily.  ? ezetimibe (ZETIA) 10 MG tablet Take 10 mg by mouth in the morning.  ? finasteride (PROSCAR) 5 MG tablet Take 5 mg by mouth daily.  ? furosemide (LASIX) 20 MG tablet Take 20 mg by mouth daily.  ? gabapentin (NEURONTIN) 100 MG capsule Take 200 mg by mouth at bedtime.  ? ibuprofen (ADVIL) 200 MG tablet Take 400 mg by mouth every 8 (eight) hours as needed (inflammation/pain.).  ? isosorbide mononitrate (IMDUR) 120 MG 24 hr tablet Take 1  tablet (120 mg total) by mouth daily.  ? levothyroxine (SYNTHROID) 112 MCG tablet Take 112 mcg by mouth daily before breakfast.  ? loteprednol (LOTEMAX) 0.5 % ophthalmic suspension Place 1 drop into the left eye 2 (two) times a week. Mondays & Fridays  ? metFORMIN (GLUCOPHAGE) 500 MG tablet Take 1 tablet (500 mg total) by mouth 2 (two) times daily with a meal.  ? nitroGLYCERIN (NITROSTAT) 0.4 MG SL tablet Place 1 tablet (0.4 mg total) under the tongue every 5 (five) minutes x 3 doses as needed for chest pain.  ? pantoprazole (PROTONIX) 40 MG tablet Take 40 mg by mouth daily after supper.   ? psyllium (METAMUCIL) 58.6 % powder Take 1 packet by mouth 3 (three) times daily. As needed  ? ranolazine (RANEXA) 1000 MG SR tablet TAKE 1  BY MOUTH TWICE DAILY  ? rosuvastatin (CRESTOR) 5 MG tablet Take 1 tablet (5 mg total) by mouth daily.  ? shark liver oil-cocoa butter (PREPARATION H) 0.25-3-85.5 % suppository Place 1 suppository rectally daily as needed for hemorrhoids.  ? tamsulosin (FLOMAX) 0.4 MG CAPS capsule Take 0.8 mg by mouth daily with supper.  ? Tiotropium Bromide Monohydrate (SPIRIVA RESPIMAT) 2.5 MCG/ACT AERS Inhale 2 puffs into the lungs daily.  ? vitamin B-12 (CYANOCOBALAMIN) 1000 MCG tablet Take 1,000 mcg by mouth in the morning.  ?  ?Allergies:   Bee venom, Cleocin [clindamycin hcl], and Statins  ? ?Social History  ? ?Tobacco Use  ? Smoking status: Former  ?  Packs/day: 2.00  ?  Years: 45.00  ?  Pack years: 90.00  ?  Types: Cigarettes  ?  Quit date: 12/05/2016  ?  Years since quitting: 5.2  ? Smokeless tobacco: Never  ?Vaping Use  ? Vaping Use: Never used  ?Substance Use Topics  ? Alcohol use: No  ? Drug use: No  ?  ?Family Hx: ?The patient's family history includes COPD in his father; Dementia in his mother; Heart Problems in his father; Lung cancer in his father. ? ?Review of Systems  ?Gastrointestinal:  Positive for diarrhea. Negative for hematochezia and melena.  ?Genitourinary:  Negative for hematuria.    ? ?EKGs/Labs/Other Test Reviewed:   ? ?EKG:  EKG is not ordered today.  The ekg ordered today demonstrates n/a ? ?Recent Labs: ?04/04/2021: NT-Pro BNP 46 ?11/21/2021: ALT 10 ?11/22/2021: Hemoglobin 9.5; Magnesium 1.9; Platelets 160 ?11/23/2021: BUN 21; Creatinine, Ser 1.21; Potassium 4.2; Sodium 134  ? ?Recent Lipid Panel ?Recent Labs  ?  11/21/21 ?1151 01/22/22 ?1133  ?CHOL 66 81*  ?TRIG 146 168*  ?HDL 12* 20*  ?VLDL 29  --   ?[place] 25 33  ?  ? ?Risk Assessment/Calculations:   ?  ?    ?Physical Exam:   ? ?VS:  BP 102/62   Pulse 60   Ht  _0  (1.753 m)   Wt 211 lb 3.2 oz (95.8 kg)   SpO2 98%   BMI 31.19 kg/m?    ? ?Wt Readings from Last 3 Encounters:  ?03/05/22 211 lb 3.2 oz (95.8 kg)  ?01/22/22 215 lb 3.2 oz (97.6 kg)  ?12/05/21 221 lb (100.2 kg)  ?  ?Constitutional:   ?   Appearance: Healthy appearance. Not in distress.  ?Neck:  ?   Vascular: JVD normal.  ?Pulmonary:  ?   Effort: Pulmonary effort is normal.  ?   Breath sounds: No wheezing. No rales.  ?Cardiovascular:  ?   Normal rate. Regular rhythm. Normal S1. Normal S2.   ?   Murmurs: There is no murmur.  ?Edema: ?   Peripheral edema absent.  ?Abdominal:  ?   Palpations: Abdomen is soft.  ?Skin: ?   General: Skin is warm and dry.  ?Neurological:  ?   General: No focal deficit present.  ?   Mental Status: Alert and oriented to person, place and time.  ?   Cranial Nerves: Cranial nerves are intact.  ?  ?    ?ASSESSMENT & PLAN:   ?Aortic atherosclerosis ([place]) ?Continue aspirin, statin. ? ?Coronary artery disease with stable angina pectoris ([place]) ?History of DES to the LAD in November 2021.  Cardiac catheterization in June 2022 demonstrated patent stent LAD.  He does have moderate nonobstructive disease in the mid LAD, third diagonal and distal RCA which is managed medically.  He is doing well without anginal symptoms on his current medical regimen.  Continue aspirin 81 mg daily, Plavix 95 mg daily, isosorbide mononitrate 120 mg daily, ranolazine 1000 mg twice  daily, Crestor 5 mg daily.  Follow-up 6 months. ? ?Essential hypertension ?Blood pressure is well controlled.  He does have some issues with hypotension at times.  This seems to occur when he has had issues with

## 2022-03-05 NOTE — Assessment & Plan Note (Signed)
Continue follow up with pulmonology

## 2022-03-05 NOTE — Assessment & Plan Note (Signed)
LDL in March 2023 was 33.  Continue Zetia 10 mg daily, Crestor 5 mg daily. ?

## 2022-03-05 NOTE — Assessment & Plan Note (Signed)
Blood pressure is well controlled.  He does have some issues with hypotension at times.  This seems to occur when he has had issues with diarrhea and vomiting.  I have recommended that he cut his isosorbide dose in half on days when he is having excessive diarrhea or vomiting and/or lower blood pressures. ?

## 2022-03-05 NOTE — Patient Instructions (Signed)
Medication Instructions:  ?Your physician recommends that you continue on your current medications as directed. Please refer to the Current Medication list given to you today, BUT IF YOUR BLOOD PRESSURE IS 95 OR LOWER ON TOP, YOU CAN CUT THE ISOSORBIDE  IN 1/2 THAT DAY ? ?*If you need a refill on your cardiac medications before your next appointment, please call your pharmacy* ? ? ?Lab Work: ?None ordered ? ?If you have labs (blood work) drawn today and your tests are completely normal, you will receive your results only by: ?MyChart Message (if you have MyChart) OR ?A paper copy in the mail ?If you have any lab test that is abnormal or we need to change your treatment, we will call you to review the results. ? ? ?Testing/Procedures: ?None ordered ? ? ?Follow-Up: ?At [facility], you and your health needs are our priority.  As part of our continuing mission to provide you with exceptional heart care, we have created designated Provider Care Teams.  These Care Teams include your primary Cardiologist (physician) and Advanced Practice Providers (APPs -  Physician Assistants and Nurse Practitioners) who all work together to provide you with the care you need, when you need it. ? ?We recommend signing up for the patient portal called "MyChart".  Sign up information is provided on this After Visit Summary.  MyChart is used to connect with patients for Virtual Visits (Telemedicine).  Patients are able to view lab/test results, encounter notes, upcoming appointments, etc.  Non-urgent messages can be sent to your provider as well.   ?To learn more about what you can do with MyChart, go to [email or web address].   ? ?Your next appointment:   ?6 month(s) ? ?The format for your next appointment:   ?In Person ? ?Provider:   ?[name], MD   ? ? ?Other Instructions ? ? ?Important Information About Sugar ? ? ? ? ?  ?

## 2022-03-05 NOTE — Assessment & Plan Note (Signed)
Follow up with EP as planned. 

## 2022-03-05 NOTE — Assessment & Plan Note (Signed)
Follow-up with neurology as planned.  I reviewed with Dr. Glenford Bayley.  The patient does not need a TEE. ?

## 2022-03-06 ENCOUNTER — Ambulatory Visit: Payer: Medicare Other

## 2022-03-11 ENCOUNTER — Ambulatory Visit: Payer: Medicare Other

## 2022-03-13 ENCOUNTER — Telehealth: Payer: Self-pay

## 2022-03-13 ENCOUNTER — Ambulatory Visit: Payer: Medicare Other

## 2022-03-13 NOTE — Telephone Encounter (Signed)
PT called to check on pt as did not show for last scheduled visit. Left message on answering machine to call back so try to schedule a visit next week with Mable Fill or Brooke as certification ends next week. ?Cherly Anderson, PT, DPT, NCS ? ?

## 2022-03-16 ENCOUNTER — Observation Stay (HOSPITAL_COMMUNITY): Payer: Medicare Other

## 2022-03-16 ENCOUNTER — Encounter (HOSPITAL_COMMUNITY): Payer: Self-pay | Admitting: Internal Medicine

## 2022-03-16 ENCOUNTER — Other Ambulatory Visit: Payer: Self-pay

## 2022-03-16 ENCOUNTER — Emergency Department (HOSPITAL_COMMUNITY): Payer: Medicare Other

## 2022-03-16 ENCOUNTER — Observation Stay (HOSPITAL_COMMUNITY)
Admission: EM | Admit: 2022-03-16 | Discharge: 2022-03-18 | Disposition: A | Discharge status: Home Health | Payer: Medicare Other | Attending: Internal Medicine | Admitting: Internal Medicine

## 2022-03-16 DIAGNOSIS — I10 Essential (primary) hypertension: Secondary | ICD-10-CM | POA: Insufficient documentation

## 2022-03-16 DIAGNOSIS — Z87891 Personal history of nicotine dependence: Secondary | ICD-10-CM | POA: Insufficient documentation

## 2022-03-16 DIAGNOSIS — E785 Hyperlipidemia, unspecified: Secondary | ICD-10-CM | POA: Insufficient documentation

## 2022-03-16 DIAGNOSIS — K5900 Constipation, unspecified: Secondary | ICD-10-CM | POA: Diagnosis not present

## 2022-03-16 DIAGNOSIS — Z825 Family history of asthma and other chronic lower respiratory diseases: Secondary | ICD-10-CM | POA: Insufficient documentation

## 2022-03-16 DIAGNOSIS — I69998 Other sequelae following unspecified cerebrovascular disease: Secondary | ICD-10-CM | POA: Insufficient documentation

## 2022-03-16 DIAGNOSIS — Z9181 History of falling: Secondary | ICD-10-CM | POA: Insufficient documentation

## 2022-03-16 DIAGNOSIS — Z7984 Long term (current) use of oral hypoglycemic drugs: Secondary | ICD-10-CM | POA: Insufficient documentation

## 2022-03-16 DIAGNOSIS — Z8249 Family history of ischemic heart disease and other diseases of the circulatory system: Secondary | ICD-10-CM | POA: Insufficient documentation

## 2022-03-16 DIAGNOSIS — R197 Diarrhea, unspecified: Secondary | ICD-10-CM | POA: Diagnosis not present

## 2022-03-16 DIAGNOSIS — R9082 White matter disease, unspecified: Secondary | ICD-10-CM | POA: Insufficient documentation

## 2022-03-16 DIAGNOSIS — K529 Noninfective gastroenteritis and colitis, unspecified: Secondary | ICD-10-CM | POA: Diagnosis not present

## 2022-03-16 DIAGNOSIS — D61818 Other pancytopenia: Secondary | ICD-10-CM | POA: Diagnosis not present

## 2022-03-16 DIAGNOSIS — I2510 Atherosclerotic heart disease of native coronary artery without angina pectoris: Secondary | ICD-10-CM | POA: Diagnosis not present

## 2022-03-16 DIAGNOSIS — I63213 Cerebral infarction due to unspecified occlusion or stenosis of bilateral vertebral arteries: Secondary | ICD-10-CM | POA: Insufficient documentation

## 2022-03-16 DIAGNOSIS — Z7902 Long term (current) use of antithrombotics/antiplatelets: Secondary | ICD-10-CM | POA: Insufficient documentation

## 2022-03-16 DIAGNOSIS — J449 Chronic obstructive pulmonary disease, unspecified: Secondary | ICD-10-CM | POA: Diagnosis not present

## 2022-03-16 DIAGNOSIS — Z955 Presence of coronary angioplasty implant and graft: Secondary | ICD-10-CM | POA: Diagnosis not present

## 2022-03-16 DIAGNOSIS — Z95810 Presence of automatic (implantable) cardiac defibrillator: Secondary | ICD-10-CM | POA: Diagnosis not present

## 2022-03-16 DIAGNOSIS — Z7982 Long term (current) use of aspirin: Secondary | ICD-10-CM | POA: Diagnosis not present

## 2022-03-16 DIAGNOSIS — Z8673 Personal history of transient ischemic attack (TIA), and cerebral infarction without residual deficits: Secondary | ICD-10-CM | POA: Insufficient documentation

## 2022-03-16 DIAGNOSIS — I63513 Cerebral infarction due to unspecified occlusion or stenosis of bilateral middle cerebral arteries: Secondary | ICD-10-CM | POA: Insufficient documentation

## 2022-03-16 DIAGNOSIS — Z96651 Presence of right artificial knee joint: Secondary | ICD-10-CM | POA: Insufficient documentation

## 2022-03-16 DIAGNOSIS — G4733 Obstructive sleep apnea (adult) (pediatric): Secondary | ICD-10-CM | POA: Insufficient documentation

## 2022-03-16 DIAGNOSIS — Z8739 Personal history of other diseases of the musculoskeletal system and connective tissue: Secondary | ICD-10-CM | POA: Insufficient documentation

## 2022-03-16 DIAGNOSIS — E119 Type 2 diabetes mellitus without complications: Secondary | ICD-10-CM | POA: Insufficient documentation

## 2022-03-16 DIAGNOSIS — R29818 Other symptoms and signs involving the nervous system: Secondary | ICD-10-CM | POA: Insufficient documentation

## 2022-03-16 DIAGNOSIS — I959 Hypotension, unspecified: Secondary | ICD-10-CM | POA: Diagnosis not present

## 2022-03-16 DIAGNOSIS — M6281 Muscle weakness (generalized): Secondary | ICD-10-CM | POA: Insufficient documentation

## 2022-03-16 DIAGNOSIS — R339 Retention of urine, unspecified: Secondary | ICD-10-CM | POA: Diagnosis not present

## 2022-03-16 DIAGNOSIS — R531 Weakness: Secondary | ICD-10-CM

## 2022-03-16 DIAGNOSIS — E039 Hypothyroidism, unspecified: Secondary | ICD-10-CM | POA: Diagnosis not present

## 2022-03-16 DIAGNOSIS — Z20822 Contact with and (suspected) exposure to covid-19: Secondary | ICD-10-CM | POA: Diagnosis not present

## 2022-03-16 DIAGNOSIS — R2689 Other abnormalities of gait and mobility: Secondary | ICD-10-CM | POA: Insufficient documentation

## 2022-03-16 DIAGNOSIS — Z950 Presence of cardiac pacemaker: Secondary | ICD-10-CM | POA: Diagnosis present

## 2022-03-16 DIAGNOSIS — G459 Transient cerebral ischemic attack, unspecified: Principal | ICD-10-CM | POA: Diagnosis present

## 2022-03-16 DIAGNOSIS — Z96642 Presence of left artificial hip joint: Secondary | ICD-10-CM | POA: Insufficient documentation

## 2022-03-16 DIAGNOSIS — I63233 Cerebral infarction due to unspecified occlusion or stenosis of bilateral carotid arteries: Secondary | ICD-10-CM | POA: Insufficient documentation

## 2022-03-16 DIAGNOSIS — I63533 Cerebral infarction due to unspecified occlusion or stenosis of bilateral posterior cerebral arteries: Secondary | ICD-10-CM | POA: Insufficient documentation

## 2022-03-16 DIAGNOSIS — Z79899 Other long term (current) drug therapy: Secondary | ICD-10-CM | POA: Diagnosis not present

## 2022-03-16 DIAGNOSIS — R2681 Unsteadiness on feet: Secondary | ICD-10-CM | POA: Insufficient documentation

## 2022-03-16 DIAGNOSIS — K7460 Unspecified cirrhosis of liver: Secondary | ICD-10-CM | POA: Diagnosis present

## 2022-03-16 DIAGNOSIS — Z96653 Presence of artificial knee joint, bilateral: Secondary | ICD-10-CM | POA: Diagnosis not present

## 2022-03-16 DIAGNOSIS — R9431 Abnormal electrocardiogram [ECG] [EKG]: Secondary | ICD-10-CM | POA: Insufficient documentation

## 2022-03-16 DIAGNOSIS — E1165 Type 2 diabetes mellitus with hyperglycemia: Secondary | ICD-10-CM | POA: Insufficient documentation

## 2022-03-16 DIAGNOSIS — R338 Other retention of urine: Secondary | ICD-10-CM | POA: Insufficient documentation

## 2022-03-16 DIAGNOSIS — I495 Sick sinus syndrome: Secondary | ICD-10-CM | POA: Insufficient documentation

## 2022-03-16 DIAGNOSIS — R748 Abnormal levels of other serum enzymes: Secondary | ICD-10-CM | POA: Insufficient documentation

## 2022-03-16 DIAGNOSIS — N401 Enlarged prostate with lower urinary tract symptoms: Secondary | ICD-10-CM | POA: Insufficient documentation

## 2022-03-16 DIAGNOSIS — D513 Other dietary vitamin B12 deficiency anemia: Secondary | ICD-10-CM | POA: Insufficient documentation

## 2022-03-16 DIAGNOSIS — Z8616 Personal history of COVID-19: Secondary | ICD-10-CM | POA: Insufficient documentation

## 2022-03-16 HISTORY — DX: Presence of cardiac pacemaker: Z95.0

## 2022-03-16 LAB — AMMONIA: Ammonia: 18 umol/L (ref 9–35)

## 2022-03-16 LAB — CBC
HCT: 34.8 % — ABNORMAL LOW (ref 39.0–52.0)
HCT: 34.7 % — ABNORMAL LOW (ref 39.0–52.0)
Hemoglobin: 11.2 g/dL — ABNORMAL LOW (ref 13.0–17.0)
Hemoglobin: 11.3 g/dL — ABNORMAL LOW (ref 13.0–17.0)
MCH: 29.9 pg (ref 26.0–34.0)
MCH: 29.7 pg (ref 26.0–34.0)
MCHC: 32.2 g/dL (ref 30.0–36.0)
MCHC: 32.6 g/dL (ref 30.0–36.0)
MCV: 92.8 fL (ref 80.0–100.0)
MCV: 91.1 fL (ref 80.0–100.0)
Platelets: UNDETERMINED 10*3/uL (ref 150–400)
Platelets: 121 10*3/uL — ABNORMAL LOW (ref 150–400)
RBC: 3.75 MIL/uL — ABNORMAL LOW (ref 4.22–5.81)
RBC: 3.81 MIL/uL — ABNORMAL LOW (ref 4.22–5.81)
RDW: 13.7 % (ref 11.5–15.5)
RDW: 13.7 % (ref 11.5–15.5)
WBC: 3.3 10*3/uL — ABNORMAL LOW (ref 4.0–10.5)
WBC: 3 10*3/uL — ABNORMAL LOW (ref 4.0–10.5)
nRBC: 0 % (ref 0.0–0.2)
nRBC: 0 % (ref 0.0–0.2)

## 2022-03-16 LAB — I-STAT CHEM 8, ED
BUN: 17 mg/dL (ref 8–23)
Calcium, Ion: 1 mmol/L — ABNORMAL LOW (ref 1.15–1.40)
Chloride: 106 mmol/L (ref 98–111)
Creatinine, Ser: 1 mg/dL (ref 0.61–1.24)
Glucose, Bld: 118 mg/dL — ABNORMAL HIGH (ref 70–99)
HCT: 32 % — ABNORMAL LOW (ref 39.0–52.0)
Hemoglobin: 10.9 g/dL — ABNORMAL LOW (ref 13.0–17.0)
Potassium: 3.7 mmol/L (ref 3.5–5.1)
Sodium: 140 mmol/L (ref 135–145)
TCO2: 22 mmol/L (ref 22–32)

## 2022-03-16 LAB — CREATININE, SERUM
Creatinine, Ser: 1.07 mg/dL (ref 0.61–1.24)
GFR, Estimated: >60 mL/min (ref >60)

## 2022-03-16 LAB — COMPREHENSIVE METABOLIC PANEL
ALT: 14 U/L (ref 0–44)
AST: 20 U/L (ref 15–41)
Albumin: 3.3 g/dL — ABNORMAL LOW (ref 3.5–5.0)
Alkaline Phosphatase: 52 U/L (ref 38–126)
Anion gap: 10 (ref 5–15)
BUN: 15 mg/dL (ref 8–23)
CO2: 25 mmol/L (ref 22–32)
Calcium: 8.8 mg/dL — ABNORMAL LOW (ref 8.9–10.3)
Chloride: 105 mmol/L (ref 98–111)
Creatinine, Ser: 1.09 mg/dL (ref 0.61–1.24)
GFR, Estimated: >60 mL/min (ref >60)
Glucose, Bld: 120 mg/dL — ABNORMAL HIGH (ref 70–99)
Potassium: 3.7 mmol/L (ref 3.5–5.1)
Sodium: 140 mmol/L (ref 135–145)
Total Bilirubin: 1.5 mg/dL — ABNORMAL HIGH (ref 0.3–1.2)
Total Protein: 6.3 g/dL — ABNORMAL LOW (ref 6.5–8.1)

## 2022-03-16 LAB — URINALYSIS, ROUTINE W REFLEX MICROSCOPIC
Bilirubin Urine: NEGATIVE
Glucose, UA: NEGATIVE mg/dL
Hgb urine dipstick: NEGATIVE
Ketones, ur: NEGATIVE mg/dL
Leukocytes,Ua: NEGATIVE
Nitrite: NEGATIVE
Protein, ur: NEGATIVE mg/dL
Specific Gravity, Urine: 1.02 (ref 1.005–1.030)
pH: 5 (ref 5.0–8.0)

## 2022-03-16 LAB — DIFFERENTIAL
Abs Immature Granulocytes: 0.01 10*3/uL (ref 0.00–0.07)
Basophils Absolute: 0 10*3/uL (ref 0.0–0.1)
Basophils Relative: 1 %
Eosinophils Absolute: 0 10*3/uL (ref 0.0–0.5)
Eosinophils Relative: 1 %
Immature Granulocytes: 0 %
Lymphocytes Relative: 21 %
Lymphs Abs: 0.7 10*3/uL (ref 0.7–4.0)
Monocytes Absolute: 0.4 10*3/uL (ref 0.1–1.0)
Monocytes Relative: 12 %
Neutro Abs: 2.2 10*3/uL (ref 1.7–7.7)
Neutrophils Relative %: 65 %
Other: 0 %
Smear Review: UNDETERMINED
nRBC: 0 /100 WBC

## 2022-03-16 LAB — PROTIME-INR
INR: 1.2 (ref 0.8–1.2)
Prothrombin Time: 14.9 seconds (ref 11.4–15.2)

## 2022-03-16 LAB — RESP PANEL BY RT-PCR (FLU A&B, COVID) ARPGX2
Influenza A by PCR: NEGATIVE
Influenza B by PCR: NEGATIVE
SARS Coronavirus 2 by RT PCR: NEGATIVE

## 2022-03-16 LAB — ETHANOL: Alcohol, Ethyl (B): <10 mg/dL (ref <10)

## 2022-03-16 LAB — HEMOGLOBIN A1C
Hgb A1c MFr Bld: 5.4 % (ref 4.8–5.6)
Mean Plasma Glucose: 108.28 mg/dL

## 2022-03-16 LAB — APTT: aPTT: 33 seconds (ref 24–36)

## 2022-03-16 MED ORDER — ALBUTEROL SULFATE (2.5 MG/3ML) 0.083% IN NEBU: 2.5000 mg | INHALATION_SOLUTION | Freq: Four times a day (QID) | RESPIRATORY_TRACT | Status: DC | PRN

## 2022-03-16 MED ORDER — TIOTROPIUM BROMIDE MONOHYDRATE 2.5 MCG/ACT IN AERS: 2.0000 | INHALATION_SPRAY | Freq: Every day | RESPIRATORY_TRACT | Status: DC

## 2022-03-16 MED ORDER — ACETAMINOPHEN 325 MG PO TABS
325.0000 mg | ORAL_TABLET | Freq: Four times a day (QID) | ORAL | Status: DC | PRN
  Administered 2022-03-17 (×2): 650 mg via ORAL
  Filled 2022-03-16 (×2): qty 2

## 2022-03-16 MED ORDER — UMECLIDINIUM BROMIDE 62.5 MCG/ACT IN AEPB
1.0000 | INHALATION_SPRAY | Freq: Every day | RESPIRATORY_TRACT | Status: DC
  Filled 2022-03-16: qty 7

## 2022-03-16 MED ORDER — TAMSULOSIN HCL 0.4 MG PO CAPS
0.8000 mg | ORAL_CAPSULE | Freq: Every day | ORAL | Status: DC
  Administered 2022-03-16 – 2022-03-17 (×2): 0.8 mg via ORAL
  Filled 2022-03-16 (×2): qty 2

## 2022-03-16 MED ORDER — ENSURE ENLIVE PO LIQD
237.0000 mL | Freq: Two times a day (BID) | ORAL | Status: DC
  Administered 2022-03-17 – 2022-03-18 (×3): 237 mL via ORAL

## 2022-03-16 MED ORDER — ALBUTEROL SULFATE HFA 108 (90 BASE) MCG/ACT IN AERS: 2.0000 | INHALATION_SPRAY | Freq: Four times a day (QID) | RESPIRATORY_TRACT | Status: DC | PRN

## 2022-03-16 MED ORDER — LOTEPREDNOL ETABONATE 0.5 % OP SUSP
1.0000 [drp] | OPHTHALMIC | Status: DC
  Filled 2022-03-16: qty 5

## 2022-03-16 MED ORDER — EZETIMIBE 10 MG PO TABS
10.0000 mg | ORAL_TABLET | Freq: Every morning | ORAL | Status: DC
  Administered 2022-03-17 – 2022-03-18 (×2): 10 mg via ORAL
  Filled 2022-03-16 (×3): qty 1

## 2022-03-16 MED ORDER — RANOLAZINE ER 500 MG PO TB12
1000.0000 mg | ORAL_TABLET | Freq: Two times a day (BID) | ORAL | Status: DC
  Administered 2022-03-16 – 2022-03-18 (×4): 1000 mg via ORAL
  Filled 2022-03-16 (×4): qty 2

## 2022-03-16 MED ORDER — LORAZEPAM 2 MG/ML IJ SOLN: 0.5000 mg | Freq: Every day | INTRAMUSCULAR | Status: DC | PRN

## 2022-03-16 MED ORDER — PANTOPRAZOLE SODIUM 40 MG PO TBEC
40.0000 mg | DELAYED_RELEASE_TABLET | Freq: Every day | ORAL | Status: DC
  Administered 2022-03-16 – 2022-03-17 (×2): 40 mg via ORAL
  Filled 2022-03-16 (×2): qty 1

## 2022-03-16 MED ORDER — FINASTERIDE 5 MG PO TABS
5.0000 mg | ORAL_TABLET | Freq: Every day | ORAL | Status: DC
  Administered 2022-03-16 – 2022-03-17 (×2): 5 mg via ORAL
  Filled 2022-03-16 (×2): qty 1

## 2022-03-16 MED ORDER — VITAMIN B-12 1000 MCG PO TABS
1000.0000 ug | ORAL_TABLET | Freq: Every morning | ORAL | Status: DC
  Administered 2022-03-17 – 2022-03-18 (×2): 1000 ug via ORAL
  Filled 2022-03-16 (×3): qty 1

## 2022-03-16 MED ORDER — NITROGLYCERIN 0.4 MG SL SUBL: 0.4000 mg | SUBLINGUAL_TABLET | SUBLINGUAL | Status: DC | PRN

## 2022-03-16 MED ORDER — GABAPENTIN 100 MG PO CAPS
200.0000 mg | ORAL_CAPSULE | Freq: Every day | ORAL | Status: DC
  Administered 2022-03-16 – 2022-03-17 (×2): 200 mg via ORAL
  Filled 2022-03-16 (×2): qty 2

## 2022-03-16 MED ORDER — LEVOTHYROXINE SODIUM 112 MCG PO TABS
112.0000 ug | ORAL_TABLET | Freq: Every day | ORAL | Status: DC
  Administered 2022-03-17 – 2022-03-18 (×2): 112 ug via ORAL
  Filled 2022-03-16 (×2): qty 1

## 2022-03-16 MED ORDER — ENOXAPARIN SODIUM 30 MG/0.3ML IJ SOSY
30.0000 mg | PREFILLED_SYRINGE | INTRAMUSCULAR | Status: DC
  Administered 2022-03-16: 30 mg via SUBCUTANEOUS
  Filled 2022-03-16: qty 0.3

## 2022-03-16 MED ORDER — CLOPIDOGREL BISULFATE 75 MG PO TABS
75.0000 mg | ORAL_TABLET | Freq: Every day | ORAL | Status: DC
  Administered 2022-03-16 – 2022-03-18 (×3): 75 mg via ORAL
  Filled 2022-03-16 (×3): qty 1

## 2022-03-16 MED ORDER — PSYLLIUM 95 % PO PACK
1.0000 | PACK | Freq: Every day | ORAL | Status: DC
  Filled 2022-03-16 (×3): qty 1

## 2022-03-16 MED ORDER — ROSUVASTATIN CALCIUM 5 MG PO TABS
5.0000 mg | ORAL_TABLET | Freq: Every day | ORAL | Status: DC
  Administered 2022-03-16 – 2022-03-18 (×3): 5 mg via ORAL
  Filled 2022-03-16 (×3): qty 1

## 2022-03-16 MED ORDER — ASPIRIN EC 81 MG PO TBEC
81.0000 mg | DELAYED_RELEASE_TABLET | Freq: Every day | ORAL | Status: DC
  Administered 2022-03-16 – 2022-03-18 (×3): 81 mg via ORAL
  Filled 2022-03-16 (×3): qty 1

## 2022-03-16 NOTE — Progress Notes (Signed)
?  X-cover Note: ?RN reports pt unable to urinate for several hours. Hx of BPH on flomax and proscar. >900 ml on bladder scan. Will place foley. ? ? ?Kristopher Oppenheim, DO ?[facility] ? ?

## 2022-03-16 NOTE — ED Triage Notes (Signed)
Pt BIB EMS due to left sided weakness. Pt is weak on left side from previous stoke. Family states pt was weak on right side this morning and felt like he was going to pass out. Pt takes plavix. EMS states when they got there sx resolved. Pt has minimal slurred speech at baseline. Pt is axox4. VSS.   ?

## 2022-03-16 NOTE — ED Provider Notes (Signed)
?[facility] ?Provider Note ? ? ?CSN: [id number] ?Arrival date & time: 03/16/22  1032 ? ?  ? ?History ? ?Chief Complaint  ?Patient presents with  ? Weakness  ? ? ?Austin Oliver is a 66 y.o. male who presents emergency department for generalized weakness.  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  He complains of 3 weeks of persistent diarrhea.  He has been to see his doctor who has collected stool samples that are currently running.  Patient states that he did take something for diarrhea and then was constipated for couple days, had a BM this morning that was solid but then has had runny stools again.  This morning he was trying to get out of bed and had generalized weakness of both of his arms and legs.  He has noticed that he has had worsening weakness over the past 3 weeks since he has been having the diarrhea.  He states that normally he just uses a cane however he has been having to use his walker and today could not even stand up or hold himself up.  His wife had to help him to the toilet this morning.  He denies unilateral weakness.  He states that his symptoms have improved since this morning.  He had trouble lifting both of his legs up off of the bed to even stand.  He denies headache, changes in vision, difficulty with speech or swallowing. ? ? ?Weakness ? ?  ? ?Home Medications ?Prior to Admission medications   ?Medication Sig Start Date End Date Taking? Authorizing Provider  ?acetaminophen (TYLENOL) 325 MG tablet Take 1-2 tablets (325-650 mg total) by mouth every 6 (six) hours as needed for mild pain (pain score 1-3 or temp > 100.5). 11/01/21  Yes Dorothyann Peng, PA-C  ?albuterol (VENTOLIN HFA) 108 (90 Base) MCG/ACT inhaler  Inhale 2 puffs into the lungs every 6 (six) hours as needed for shortness of breath or wheezing. 05/22/21  Yes [provider]  ?aspirin EC 81 MG tablet Take 81 mg by mouth every evening. Swallow whole.   Yes [provider]  ?cholecalciferol (VITAMIN D3) 25 MCG (1000 UNIT) tablet Take 1,000 Units by mouth daily.   Yes [provider]  ?clopidogrel (PLAVIX) 75 MG tablet Take 1 tablet (75 mg total) by mouth daily. 12/27/21  Yes Chandrasekhar, Mahesh A, MD  ?ezetimibe (ZETIA) 10 MG tablet Take 10 mg by mouth in the morning.   Yes [provider]  ?finasteride (PROSCAR) 5 MG tablet Take 5 mg by mouth at bedtime. 06/13/21  Yes [provider]  ?furosemide (LASIX) 20 MG tablet Take 20 mg by mouth daily.   Yes [provider]  ?gabapentin (NEURONTIN) 100 MG capsule Take 200 mg by mouth at bedtime.   Yes [provider]  ?ibuprofen (ADVIL) 200 MG tablet Take 400 mg by mouth every 8 (eight) hours as needed (inflammation/pain.).   Yes [provider]  ?isosorbide mononitrate (IMDUR) 120 MG 24 hr tablet Take 1 tablet (120 mg total) by mouth daily. 02/10/22  Yes Chandrasekhar, Mahesh A, MD  ?levothyroxine (SYNTHROID) 112 MCG tablet Take 112 mcg by mouth daily before breakfast.   Yes [provider]  ?loteprednol (LOTEMAX) 0.5 % ophthalmic suspension Place 1 drop into the left  eye daily as needed (if having eye pain from shingles).   Yes [provider]  ?nitroGLYCERIN (NITROSTAT) 0.4 MG SL tablet Place 1 tablet (0.4 mg total) under the tongue every 5 (five) minutes x 3 doses as needed for chest pain. 08/27/20  Yes Chandrasekhar, Mahesh A, MD  ?pantoprazole (PROTONIX) 40 MG tablet Take 40 mg by mouth daily after supper.    Yes [provider]  ?psyllium (METAMUCIL) 58.6 % powder Take 1 packet by mouth 3 (three) times daily as needed (constipation).   Yes [provider]  ?ranolazine (RANEXA) 1000 MG SR tablet TAKE 1  BY MOUTH  TWICE DAILY ?Patient taking differently: Take 1,000 mg by mouth 2 (two) times daily. 10/09/21  Yes Chandrasekhar, Mahesh A, MD  ?rosuvastatin (CRESTOR) 5 MG tablet Take 1 tablet (5 mg total) by mouth daily. 12/27/21  Yes Chandrasekhar, Mahesh A, MD  ?shark liver oil-cocoa butter (PREPARATION H) 0.25-3-85.5 % suppository Place 1 suppository rectally daily as needed for hemorrhoids.   Yes [provider]  ?tamsulosin (FLOMAX) 0.4 MG CAPS capsule Take 0.8 mg by mouth daily with supper. 10/02/20  Yes [provider]  ?Tiotropium Bromide Monohydrate (SPIRIVA RESPIMAT) 2.5 MCG/ACT AERS Inhale 2 puffs into the lungs daily. 01/15/22  Yes Olalere, Adewale A, MD  ?vitamin B-12 (CYANOCOBALAMIN) 1000 MCG tablet Take 1,000 mcg by mouth in the morning.   Yes [provider]  ?blood glucose meter kit and supplies KIT Dispense based on patient and insurance preference. Use up to four times daily as directed. 11/23/21   Florencia Reasons, MD  ?metFORMIN (GLUCOPHAGE) 500 MG tablet Take 1 tablet (500 mg total) by mouth 2 (two) times daily with a meal. 11/23/21   Florencia Reasons, MD  ?   ? ?Allergies    ?Bee venom, Cleocin [clindamycin hcl], and Statins   ? ?Review of Systems   ?Review of Systems  ?Neurological:  Positive for weakness.  ? ?Physical Exam ?Updated Vital Signs ?BP (!) 139/113 (BP Location: Left Arm)   Pulse 60   Temp 97.9 ?F (36.6 ?C) (Oral)   Resp 20   Ht 5' 9"  (1.753 m)   Wt 95.3 kg   SpO2 100%   BMI 31.01 kg/m?  ?Physical Exam ?Vitals and nursing note reviewed.  ?Constitutional:   ?   General: He is not in acute distress. ?   Appearance: He is well-developed. He is not diaphoretic.  ?HENT:  ?   Head: Normocephalic and atraumatic.  ?Eyes:  ?   General: No scleral icterus. ?   Conjunctiva/sclera: Conjunctivae normal.  ?Cardiovascular:  ?   Rate and Rhythm: Normal rate and regular rhythm.  ?   Heart sounds: Normal heart sounds.  ?Pulmonary:  ?   Effort: Pulmonary effort is normal. No respiratory distress.  ?    Breath sounds: Normal breath sounds.  ?Abdominal:  ?   Palpations: Abdomen is soft.  ?   Tenderness: There is no abdominal tenderness.  ?Musculoskeletal:  ?   Cervical back: Normal range of motion and neck supple.  ?Skin: ?   General: Skin is warm and dry.  ?Neurological:  ?   General: No focal deficit present.  ?   Mental Status: He is alert and oriented to person, place, and time.  ?   Cranial Nerves: No cranial nerve deficit.  ?   Sensory: No sensory deficit.  ?   Motor: No weakness.  ?   Coordination: Coordination normal.  ?   Deep Tendon Reflexes:  Reflexes normal.  ?Psychiatric:     ?   Behavior: Behavior normal.  ? ? ?ED Results / Procedures / Treatments   ?Labs ?(all labs ordered are listed, but only abnormal results are displayed) ?Labs Reviewed  ?CBC - Abnormal; Notable for the following components:  ?    Result Value  ? WBC 3.3 (*)   ? RBC 3.75 (*)   ? Hemoglobin 11.2 (*)   ? HCT 34.8 (*)   ? All other components within normal limits  ?COMPREHENSIVE METABOLIC PANEL - Abnormal; Notable for the following components:  ? Glucose, Bld 120 (*)   ? Calcium 8.8 (*)   ? Total Protein 6.3 (*)   ? Albumin 3.3 (*)   ? Total Bilirubin 1.5 (*)   ? All other components within normal limits  ?I-STAT CHEM 8, ED - Abnormal; Notable for the following components:  ? Glucose, Bld 118 (*)   ? Calcium, Ion 1.00 (*)   ? Hemoglobin 10.9 (*)   ? HCT 32.0 (*)   ? All other components within normal limits  ?RESP PANEL BY RT-PCR (FLU A&B, COVID) ARPGX2  ?ETHANOL  ?PROTIME-INR  ?APTT  ?DIFFERENTIAL  ?AMMONIA  ?RAPID URINE DRUG SCREEN, HOSP PERFORMED  ?URINALYSIS, ROUTINE W REFLEX MICROSCOPIC  ?HEMOGLOBIN A1C  ?CBC  ?CREATININE, SERUM  ? ? ?EKG ?EKG Interpretation ? ?Date/Time:  Sunday March 16 2022 10:44:51 EDT ?Ventricular Rate:  66 ?PR Interval:  71 ?QRS Duration: 110 ?QT Interval:  456 ?QTC Calculation: 478 ?R Axis:   75 ?Text Interpretation: Sinus rhythm Short PR interval Borderline low voltage, extremity leads Borderline prolonged  QT interval No significant change since last tracing Confirmed by Isla Pence [phone]) on 03/16/2022 10:48:19 AM ? ?Radiology ?CT HEAD WO CONTRAST ? ?Result Date: 03/16/2022 ?CLINICAL DATA:  Neuro deficit, ac

## 2022-03-16 NOTE — Evaluation (Signed)
Physical Therapy Evaluation Patient Details Name: Austin Oliver MRN: [id number] DOB: [birth year] Today's Date: 03/16/2022  History of Present Illness  The pt is a 66 yo male presenting 4/30 with acute onset R-sided weakness that resolved by the time EMS arrived. 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   Clinical Impression  Pt in bed upon arrival of PT, agreeable to evaluation at this time. Prior to admission the pt was mobilizing with use of RW after recent weakness from 2-3 weeks of diarrhea, but reports prior to that he was ambulating with use of cane and independent with ADLs. The pt now presents with limitations in functional mobility, strength, power, stability, and endurance due to above dx, and will continue to benefit from skilled PT to address these deficits. He was able to complete sit-stand transfers and hallway ambulation with minG when using RW for BUE support, and had no overt LOB or buckling with gait. He reports he feels back to his mobility baseline prior to the events of this morning, and is most limited by R knee stiffness. The pt will continue to benefit from skilled PT acutely, but will be safe to return home with family assist and return to OPPT as the pt reports he has had significant improvements while working with therapy.   Gait Speed: 0.39m/s using RW. (Gait speed <0.38m/s indicates increased risk of falls and dependence in ADLs)         Recommendations for follow up therapy are one component of a multi-disciplinary discharge planning process, led by the attending physician.  Recommendations may be updated based on patient status, additional functional criteria and insurance authorization.  Follow Up Recommendations Outpatient PT (OP neuro PT)    Assistance Recommended at Discharge Intermittent Supervision/Assistance  Patient can return home with the  following  A little help with walking and/or transfers;A little help with bathing/dressing/bathroom;Assistance with cooking/housework;Direct supervision/assist for medications management;Direct supervision/assist for financial management;Assist for transportation;Help with stairs or ramp for entrance    Equipment Recommendations None recommended by PT  Recommendations for Other Services       Functional Status Assessment Patient has had a recent decline in their functional status and demonstrates the ability to make significant improvements in function in a reasonable and predictable amount of time.     Precautions / Restrictions Precautions Precautions: Fall Precaution Comments: hx of falls Restrictions Weight Bearing Restrictions: No      Mobility  Bed Mobility Overal bed mobility: Independent                  Transfers Overall transfer level: Needs assistance Equipment used: Rolling walker (2 wheels) Transfers: Sit to/from Stand Sit to Stand: Min guard           General transfer comment: no assist given, slow to rise due to R knee soreness    Ambulation/Gait Ambulation/Gait assistance: Min guard Gait Distance (Feet): 200 Feet Assistive device: Rolling walker (2 wheels) Gait Pattern/deviations: Step-through pattern, Decreased stride length Gait velocity: 0.28 m/s Gait velocity interpretation: <1.31 ft/sec, indicative of household ambulator   General Gait Details: pt focused on DF and able to complete with good clearance bilaterally. no overt LOB but benefits from BUE support. able to tolerate small balance challenges with gait  Modified Rankin (Stroke Patients Only) Modified Rankin (Stroke Patients Only) Pre-Morbid Rankin Score: Moderate disability Modified Rankin: Moderately severe disability  Balance Overall balance assessment: History of Falls, Needs assistance Sitting-balance support: No upper extremity supported, Feet supported Sitting  balance-Leahy Scale: Good     Standing balance support: Bilateral upper extremity supported, During functional activity Standing balance-Leahy Scale: Fair Standing balance comment: static stand without UE support, BUE support for gait                             Pertinent Vitals/Pain Pain Assessment Pain Assessment: Faces Faces Pain Scale: Hurts even more Pain Location: R knee with extension or flexion, reports recent TKA and then injury to R knee in fall Pain Descriptors / Indicators: Discomfort Pain Intervention(s): Limited activity within patient's tolerance, Monitored during session, Repositioned    Home Living Family/patient expects to be discharged to:: Private residence Living Arrangements: Spouse/significant other Available Help at Discharge: Family Type of Home: House Home Access: Stairs to enter Entrance Stairs-Rails: None Entrance Stairs-Number of Steps: 1   Home Layout: Multi-level;Able to live on main level with bedroom/bathroom Home Equipment: Rolling Walker (2 wheels);BSC/3in1;Cane - single point;Shower seat;Grab bars - toilet;Grab bars - tub/shower Additional Comments: (P) doing    Prior Function Prior Level of Function : Independent/Modified Independent             Mobility Comments: had progressed to use of cane with OPPT, started using RW again due to weakness from diarrhea ADLs Comments: pt reports independence     Hand Dominance   Dominant Hand: Right    Extremity/Trunk Assessment   Upper Extremity Assessment Upper Extremity Assessment: Defer to OT evaluation (grossly functional, coordination intact bilaterally)    Lower Extremity Assessment Lower Extremity Assessment: LLE deficits/detail;RLE deficits/detail RLE Deficits / Details: hx of R TKA with residual weakness and soreness. reports some burning around knee after surgery, no other change in sensation at present RLE Sensation: WNL RLE Coordination: WNL LLE Deficits / Details:  grossly 4/5 to MMT LLE Sensation: WNL LLE Coordination: WNL    Cervical / Trunk Assessment Cervical / Trunk Assessment: Normal  Communication   Communication: No difficulties  Cognition Arousal/Alertness: Awake/alert Behavior During Therapy: WFL for tasks assessed/performed Overall Cognitive Status: Impaired/Different from baseline Area of Impairment: Safety/judgement, Problem solving                         Safety/Judgement: Decreased awareness of deficits   Problem Solving: Requires verbal cues General Comments: verbose, verbal cues needed at times to maintain focus, address safety. pt able to follow all instructions.        General Comments General comments (skin integrity, edema, etc.): VSS on RA    Exercises     Assessment/Plan    PT Assessment Patient needs continued PT services  PT Problem List Decreased strength;Decreased activity tolerance;Decreased balance;Decreased mobility       PT Treatment Interventions DME instruction;Gait training;Stair training;Functional mobility training;Therapeutic activities;Therapeutic exercise;Balance training;Patient/family education    PT Goals (Current goals can be found in the Care Plan section)  Acute Rehab PT Goals Patient Stated Goal: return home and resume OPPT PT Goal Formulation: With patient Time For Goal Achievement: 03/30/22 Potential to Achieve Goals: Good    Frequency Min 3X/week        AM-PAC PT "6 Clicks" Mobility  Outcome Measure Help needed turning from your back to your side while in a flat bed without using bedrails?: A Little Help needed moving from lying on your back to sitting on the side of  a flat bed without using bedrails?: A Little Help needed moving to and from a bed to a chair (including a wheelchair)?: A Little Help needed standing up from a chair using your arms (e.g., wheelchair or bedside chair)?: A Little Help needed to walk in hospital room?: A Little Help needed climbing 3-5  steps with a railing? : A Little 6 Click Score: 18    End of Session Equipment Utilized During Treatment: Gait belt Activity Tolerance: Patient tolerated treatment well Patient left: in bed;with call bell/phone within reach;with bed alarm set;with family/visitor present Nurse Communication: Mobility status PT Visit Diagnosis: Other abnormalities of gait and mobility (R26.89);History of falling (Z91.81);Muscle weakness (generalized) (M62.81)    Time: 9629-5284 PT Time Calculation (min) (ACUTE ONLY): 26 min   Charges:   PT Evaluation $PT Eval Low Complexity: 1 Low PT Treatments $Therapeutic Exercise: 8-22 mins        Vickki Muff, PT, DPT   Acute Rehabilitation Department Pager #: [phone]  Ronnie Derby 03/16/2022, 6:42 PM

## 2022-03-16 NOTE — Progress Notes (Signed)
Patient arrived  to room 3W06 VS stable, free from pain. Wife and son at bedside.  ?

## 2022-03-16 NOTE — Progress Notes (Signed)
SLP Cancellation Note ? ?Patient Details ?Name: Austin Oliver ?MRN: [id number] ?DOB: 02-06-1956 ? ? ?Cancelled treatment:       Reason Eval/Treat Not Completed: SLP screened, no needs identified, will sign off (passed [facility].) ? ? ?[name] ?03/16/2022, 3:47 PM ? ? ?

## 2022-03-16 NOTE — Progress Notes (Signed)
EEG complete - results pending 

## 2022-03-16 NOTE — Consult Note (Signed)
NEUROLOGY CONSULTATION NOTE  ? ?Date of service: March 16, 2022 ?Patient Name: Austin Oliver ?MRN:  [id number] ?DOB:  February 25, 1956 ?Reason for consult: "R Leg weakness that resolved, concerning for TIA" ?Requesting Provider: Domenic Polite, MD ?_ _ _   _ __   _ __ _ _  __ __   _ __   __ _ ? ?History of Present Illness  ?Austin Oliver is a 66 y.o. 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 He woke up with generalized weakness this AM. Got up and noted R leg was weak and he was dragging his R foot. He called EMS. R leg weakness, improving and gradually resolved. ? ?LKW: 0800 on 04/14/22 ?mRS: 0 ?tNKASE: not offered, resolution of symptoms. ?Thrombectomy: not offered, resolution of symptoms. ?NIHSS components Score: Comment  ?1a Level of Conscious 0'[x]'$  1'[]'$  2'[]'$  3'[]'$      ?1b LOC Questions 0'[x]'$  1'[]'$  2'[]'$       ?1c LOC Commands 0'[x]'$  1'[]'$  2'[]'$       ?2 Best Gaze 0'[x]'$  1'[]'$  2'[]'$       ?3 Visual 0'[x]'$  1'[]'$  2'[]'$  3'[]'$      ?4 Facial Palsy 0'[x]'$  1'[]'$  2'[]'$  3'[]'$      ?5a Motor Arm - left 0'[x]'$  1'[]'$  2'[]'$  3'[]'$  4'[]'$  UN'[]'$    ?5b Motor Arm - Right 0'[x]'$  1'[]'$  2'[]'$  3'[]'$  4'[]'$  UN'[]'$    ?6a Motor Leg - Left 0'[x]'$  1'[]'$  2'[]'$  3'[]'$  4'[]'$  UN'[]'$    ?6b Motor Leg - Right 0'[x]'$  1'[]'$  2'[]'$  3'[]'$  4'[]'$  UN'[]'$    ?7 Limb Ataxia 0'[x]'$  1'[]'$  2'[]'$  3'[]'$  UN'[]'$     ?8 Sensory 0'[x]'$  1'[]'$  2'[]'$  UN'[]'$      ?9 Best Language 0'[x]'$  1'[]'$  2'[]'$  3'[]'$      ?10 Dysarthria 0'[x]'$  1'[]'$  2'[]'$  UN'[]'$      ?11 Extinct. and Inattention 0'[x]'$  1'[]'$  2'[]'$       ?TOTAL: 0   ? ?  ?ROS  ? ?Constitutional Denies weight loss, fever and chills.   ?HEENT Denies changes in vision and hearing.   ?Respiratory Denies SOB and cough.   ?CV Denies palpitations and CP   ?GI Denies abdominal pain, nausea, vomiting and diarrhea.   ?GU Denies dysuria and urinary frequency.   ?MSK Denies myalgia and joint pain.   ?Skin Denies  rash and pruritus.   ?Neurological Denies headache and syncope.   ?Psychiatric Denies recent changes in mood. Denies anxiety and depression.   ? ?Past History  ? ?Past Medical History:  ?Diagnosis Date  ? Cirrhosis of liver ([place])   ? COPD (chronic obstructive pulmonary disease) ([place])   ? Coronary artery calcification seen on CAT scan 01/14/2017  ? Essential hypertension 09/11/2020  ? GERD (gastroesophageal reflux disease)   ? History of kidney stones   ? Hyperlipidemia   ? Hypothyroidism   ? OSA on CPAP   ? Mild with AHI 7.8/hr >>on CPAP  ? Osteoarthritis   ? Presence of permanent cardiac pacemaker   ? Shingles 2012  ? ?Past Surgical History:  ?Procedure Laterality Date  ? CORONARY STENT INTERVENTION N/A 08/24/2020  ? Procedure: CORONARY STENT INTERVENTION;  Surgeon: [name], MD;  Location: [facility];  Service: Cardiovascular;  Laterality: N/A;  ? HEEL SPUR SURGERY Left 80's  ? INTRAVASCULAR PRESSURE WIRE/FFR STUDY N/A 04/25/2021  ? Procedure: INTRAVASCULAR PRESSURE WIRE/FFR STUDY;  Surgeon: [name], MD;  Location: [facility];  Service: Cardiovascular;  Laterality: N/A;  ? INTRAVASCULAR ULTRASOUND/IVUS N/A 08/24/2020  ? Procedure: Intravascular Ultrasound/IVUS;  Surgeon: [name], MD;  Location: [facility];  Service: Cardiovascular;  Laterality: N/A;  ? KNEE ARTHROPLASTY Right 10/31/2021  ? Procedure: COMPUTER ASSISTED TOTAL KNEE ARTHROPLASTY;  Surgeon: Rod Can, MD;  Location: [facility];  Service: Orthopedics;  Laterality: Right;  ? KNEE SURGERY Bilateral   ? numerous times  ? LEFT HEART CATH AND CORONARY ANGIOGRAPHY N/A 08/24/2020  ? Procedure: LEFT HEART CATH AND CORONARY ANGIOGRAPHY;  Surgeon: [name], MD;  Location: [facility];  Service: Cardiovascular;  Laterality: N/A;  ? LEFT HEART CATH AND CORONARY ANGIOGRAPHY N/A 04/25/2021  ? Procedure: LEFT HEART CATH AND CORONARY ANGIOGRAPHY;  Surgeon: [name], MD;  Location: [facility];   Service: Cardiovascular;  Laterality: N/A;  ? NECK SURGERY  70's  ? PACEMAKER IMPLANT N/A 12/04/2020  ? Procedure: PACEMAKER IMPLANT;  Surgeon: [name], MD;  Location: [facility];  Service: Cardiovascular;  Laterality: N/A;  ? TOTAL HIP ARTHROPLASTY Left 03/09/2017  ? Procedure: LEFT TOTAL HIP ARTHROPLASTY ANTERIOR APPROACH;  Surgeon: Rod Can, MD;  Location: [facility];  Service: Orthopedics;  Laterality: Left;  Dr. requesting RNFA  ? ?Family History  ?Problem Relation Age of Onset  ? Dementia Mother   ? Heart Problems Father   ? COPD Father   ? Lung cancer Father   ? ?Social History  ? ?Socioeconomic History  ? Marital status: Married  ?  Spouse name: [name]  ? Number of children: 2  ? Years of education: Not on file  ? Highest education level: Not on file  ?Occupational History  ? Occupation: Animator  ?Tobacco Use  ? Smoking status: Former  ?  Packs/day: 2.00  ?  Years: 45.00  ?  Pack years: 90.00  ?  Types: Cigarettes  ?  Quit date: 12/05/2016  ?  Years since quitting: 5.2  ? Smokeless tobacco: Never  ?Vaping Use  ? Vaping Use: Never used  ?Substance and Sexual Activity  ? Alcohol use: No  ? Drug use: No  ? Sexual activity: Not on file  ?Other Topics Concern  ? Not on file  ?Social History Narrative  ? Not on file  ? ?Social Determinants of Health  ? ?Financial Resource Strain: Not on file  ?Food Insecurity: Not on file  ?Transportation Needs: Not on file  ?Physical Activity: Not on file  ?Stress: Not on file  ?Social Connections: Not on file  ? ?Allergies  ?Allergen Reactions  ? Bee Venom Swelling  ?  Extreme Swelling of site    ? Cleocin [Clindamycin Hcl] Rash  ?  RASH IN BETWEEN FINGERS  ? Statins Other (See Comments)  ?  Muscles aches with several statins  ? ? ?Medications  ? ?Medications Prior to Admission  ?Medication Sig Dispense Refill Last Dose  ? acetaminophen (TYLENOL) 325 MG tablet Take 1-2 tablets (325-650 mg total) by mouth every 6 (six) hours as needed for mild pain  (pain score 1-3 or temp > 100.5).   03/15/2022  ? albuterol (VENTOLIN HFA) 108 (90 Base) MCG/ACT inhaler Inhale 2 puffs into the lungs every 6 (six) hours as needed for shortness of breath or wheezing.   Past Week  ? aspirin EC 81 MG tablet Take 81 mg by mouth every evening. Swallow whole.   03/15/2022  ? cholecalciferol (VITAMIN  D3) 25 MCG (1000 UNIT) tablet Take 1,000 Units by mouth daily.   03/15/2022  ? clopidogrel (PLAVIX) 75 MG tablet Take 1 tablet (75 mg total) by mouth daily. 90 tablet 1 03/15/2022 at am  ? ezetimibe (ZETIA) 10 MG tablet Take 10 mg by mouth in the morning.   03/15/2022  ? finasteride (PROSCAR) 5 MG tablet Take 5 mg by mouth at bedtime.   03/15/2022  ? furosemide (LASIX) 20 MG tablet Take 20 mg by mouth daily.   03/15/2022  ? gabapentin (NEURONTIN) 100 MG capsule Take 200 mg by mouth at bedtime.   03/15/2022  ? ibuprofen (ADVIL) 200 MG tablet Take 400 mg by mouth every 8 (eight) hours as needed (inflammation/pain.).   03/14/2022  ? isosorbide mononitrate (IMDUR) 120 MG 24 hr tablet Take 1 tablet (120 mg total) by mouth daily. 90 tablet 0 03/15/2022  ? levothyroxine (SYNTHROID) 112 MCG tablet Take 112 mcg by mouth daily before breakfast.   03/15/2022  ? loteprednol (LOTEMAX) 0.5 % ophthalmic suspension Place 1 drop into the left eye daily as needed (if having eye pain from shingles).   unk  ? nitroGLYCERIN (NITROSTAT) 0.4 MG SL tablet Place 1 tablet (0.4 mg total) under the tongue every 5 (five) minutes x 3 doses as needed for chest pain. 25 tablet 4 unk  ? pantoprazole (PROTONIX) 40 MG tablet Take 40 mg by mouth daily after supper.    03/15/2022  ? psyllium (METAMUCIL) 58.6 % powder Take 1 packet by mouth 3 (three) times daily as needed (constipation).   Past Month  ? ranolazine (RANEXA) 1000 MG SR tablet TAKE 1  BY MOUTH TWICE DAILY (Patient taking differently: Take 1,000 mg by mouth 2 (two) times daily.) 180 tablet 2 03/15/2022  ? rosuvastatin (CRESTOR) 5 MG tablet Take 1 tablet (5 mg total) by mouth  daily. 90 tablet 1 03/15/2022  ? shark liver oil-cocoa butter (PREPARATION H) 0.25-3-85.5 % suppository Place 1 suppository rectally daily as needed for hemorrhoids.   unk  ? tamsulosin (FLOMAX) 0.4 MG

## 2022-03-16 NOTE — H&P (Signed)
?History and Physical  ? ? ?Patient: Austin Oliver DOB: [birth year] ?DOA: 03/16/2022 ?DOS: the patient was seen and examined on 03/16/2022 ?PCP: [name], MD  ?Patient coming from: Home ? ?Chief Complaint:  ?Chief Complaint  ?Patient presents with  ? Weakness  ? ?HPI: Austin Oliver is a 66 y.o. male with medical history significant of CAD, COPD, sick sinus syndrome status post pacer, liver cirrhosis, history of cryptogenic embolic strokes in January 23 in the setting of COVID, which resulted in left leg weakness.  Patient has been improving with physical therapy. ?-For the last 3 weeks has been having some diarrhea, recently saw his PCP for this, was advised to stop metformin, plan for GI pathogen panel etc., took an antidiarrheal few days ago and diarrhea has slowed down since, ?-This morning when he woke up he was weak all over, could not get out of bed, could not stand up, eventually when he was able to get up and start moving he was dragging his right side and also felt weak and shaky.  He has some residual left-sided weakness following recent stroke in January. ?-EMS was called, after EMS arrived over time started regaining strength in his right leg. ?-In the ED vitals stable, electrolytes were unremarkable, CT head was unrevealing, COVID PCR was negative, EtOH level was less than 10, TRH and neurology were consulted by EDP ? ?Review of Systems: 14 system review negative except per HPI ?Past Medical History:  ?Diagnosis Date  ? AICD (automatic cardioverter/defibrillator) present   ? for sick sinus syndrome  ? Cirrhosis of liver ([place])   ? COPD (chronic obstructive pulmonary disease) ([place])   ? Coronary artery calcification seen on CAT scan 01/14/2017  ? Essential hypertension 09/11/2020  ? GERD (gastroesophageal reflux disease)   ? History of kidney stones   ? Hyperlipidemia   ? Hypothyroidism   ? OSA on CPAP   ? Mild with AHI 7.8/hr >>on CPAP  ? Osteoarthritis   ? Shingles 2012  ? ?Past  Surgical History:  ?Procedure Laterality Date  ? CORONARY STENT INTERVENTION N/A 08/24/2020  ? Procedure: CORONARY STENT INTERVENTION;  Surgeon: [name], MD;  Location: [facility];  Service: Cardiovascular;  Laterality: N/A;  ? HEEL SPUR SURGERY Left 80's  ? INTRAVASCULAR PRESSURE WIRE/FFR STUDY N/A 04/25/2021  ? Procedure: INTRAVASCULAR PRESSURE WIRE/FFR STUDY;  Surgeon: [name], MD;  Location: [facility];  Service: Cardiovascular;  Laterality: N/A;  ? INTRAVASCULAR ULTRASOUND/IVUS N/A 08/24/2020  ? Procedure: Intravascular Ultrasound/IVUS;  Surgeon: [name], MD;  Location: [facility];  Service: Cardiovascular;  Laterality: N/A;  ? KNEE ARTHROPLASTY Right 10/31/2021  ? Procedure: COMPUTER ASSISTED TOTAL KNEE ARTHROPLASTY;  Surgeon: Rod Can, MD;  Location: [facility];  Service: Orthopedics;  Laterality: Right;  ? KNEE SURGERY Bilateral   ? numerous times  ? LEFT HEART CATH AND CORONARY ANGIOGRAPHY N/A 08/24/2020  ? Procedure: LEFT HEART CATH AND CORONARY ANGIOGRAPHY;  Surgeon: [name], MD;  Location: [facility];  Service: Cardiovascular;  Laterality: N/A;  ? LEFT HEART CATH AND CORONARY ANGIOGRAPHY N/A 04/25/2021  ? Procedure: LEFT HEART CATH AND CORONARY ANGIOGRAPHY;  Surgeon: [name], MD;  Location: [facility];  Service: Cardiovascular;  Laterality: N/A;  ? NECK SURGERY  70's  ? PACEMAKER IMPLANT N/A 12/04/2020  ? Procedure: PACEMAKER IMPLANT;  Surgeon: [name], MD;  Location: [facility];  Service: Cardiovascular;  Laterality: N/A;  ? TOTAL  HIP ARTHROPLASTY Left 03/09/2017  ? Procedure: LEFT TOTAL HIP ARTHROPLASTY ANTERIOR APPROACH;  Surgeon: Rod Can, MD;  Location: [facility];  Service: Orthopedics;  Laterality: Left;  Dr. requesting RNFA  ? ?Social History:  reports that he quit smoking about 5 years ago. His smoking use included cigarettes. He has a 90.00 pack-year smoking history. He has never used smokeless  tobacco. He reports that he does not drink alcohol and does not use drugs. ? ?Allergies  ?Allergen Reactions  ? Bee Venom Swelling  ?  Extreme Swelling of site    ? Cleocin [Clindamycin Hcl] Rash  ?  RASH IN BETWEEN FINGERS  ? Statins Other (See Comments)  ?  Muscles aches with several statins  ? ? ?Family History  ?Problem Relation Age of Onset  ? Dementia Mother   ? Heart Problems Father   ? COPD Father   ? Lung cancer Father   ? ? ?Prior to Admission medications   ?Medication Sig Start Date End Date Taking? Authorizing Provider  ?acetaminophen (TYLENOL) 325 MG tablet Take 1-2 tablets (325-650 mg total) by mouth every 6 (six) hours as needed for mild pain (pain score 1-3 or temp > 100.5). 11/01/21   Dorothyann Peng, PA-C  ?albuterol (VENTOLIN HFA) 108 (90 Base) MCG/ACT inhaler Inhale 2 puffs into the lungs every 6 (six) hours as needed for shortness of breath or wheezing. 05/22/21   [provider]  ?aspirin EC 81 MG tablet Take 81 mg by mouth daily. Swallow whole.    [provider]  ?blood glucose meter kit and supplies KIT Dispense based on patient and insurance preference. Use up to four times daily as directed. 11/23/21   Florencia Reasons, MD  ?cholecalciferol (VITAMIN D3) 25 MCG (1000 UNIT) tablet Take 1,000 Units by mouth daily.    [provider]  ?clopidogrel (PLAVIX) 75 MG tablet Take 1 tablet (75 mg total) by mouth daily. 12/27/21   [name], MD  ?ezetimibe (ZETIA) 10 MG tablet Take 10 mg by mouth in the morning.    [provider]  ?finasteride (PROSCAR) 5 MG tablet Take 5 mg by mouth daily. 06/13/21   [provider]  ?furosemide (LASIX) 20 MG tablet Take 20 mg by mouth daily.    [provider]  ?gabapentin (NEURONTIN) 100 MG capsule Take 200 mg by mouth at bedtime.    [provider]  ?ibuprofen (ADVIL) 200 MG tablet Take 400 mg by mouth every 8 (eight) hours as needed (inflammation/pain.).    [provider]  ?isosorbide  mononitrate (IMDUR) 120 MG 24 hr tablet Take 1 tablet (120 mg total) by mouth daily. 02/10/22   [name], MD  ?levothyroxine (SYNTHROID) 112 MCG tablet Take 112 mcg by mouth daily before breakfast.    [provider]  ?loteprednol (LOTEMAX) 0.5 % ophthalmic suspension Place 1 drop into the left eye 2 (two) times a week. Mondays & Fridays    [provider]  ?metFORMIN (GLUCOPHAGE) 500 MG tablet Take 1 tablet (500 mg total) by mouth 2 (two) times daily with a meal. 11/23/21   Florencia Reasons, MD  ?nitroGLYCERIN (NITROSTAT) 0.4 MG SL tablet Place 1 tablet (0.4 mg total) under the tongue every 5 (five) minutes x 3 doses as needed for chest pain. 08/27/20   Chandrasekhar, Terisa Starr, MD  ?pantoprazole (PROTONIX) 40 MG tablet Take 40 mg by mouth daily after supper.     [provider]  ?psyllium (METAMUCIL) 58.6 % powder Take  1 packet by mouth 3 (three) times daily. As needed    [provider]  ?ranolazine (RANEXA) 1000 MG SR tablet TAKE 1  BY MOUTH TWICE DAILY 10/09/21   Chandrasekhar, Mahesh A, MD  ?rosuvastatin (CRESTOR) 5 MG tablet Take 1 tablet (5 mg total) by mouth daily. 12/27/21   [name], MD  ?shark liver oil-cocoa butter (PREPARATION H) 0.25-3-85.5 % suppository Place 1 suppository rectally daily as needed for hemorrhoids.    [provider]  ?tamsulosin (FLOMAX) 0.4 MG CAPS capsule Take 0.8 mg by mouth daily with supper. 10/02/20   [provider]  ?Tiotropium Bromide Monohydrate (SPIRIVA RESPIMAT) 2.5 MCG/ACT AERS Inhale 2 puffs into the lungs daily. 01/15/22   Laurin Coder, MD  ?vitamin B-12 (CYANOCOBALAMIN) 1000 MCG tablet Take 1,000 mcg by mouth in the morning.    [provider]  ? ? ?Physical Exam: ?Vitals:  ? 03/16/22 1245 03/16/22 1315 03/16/22 1330 03/16/22 1400  ?BP: 112/73 107/88 106/73 123/63  ?Pulse: 60 64 65 64  ?Resp: 12 (!) _0 ?Temp:      ?TempSrc:      ?SpO2: 99% 100% 99% 97%  ?Weight:      ?Height:       ? ? ?Data Reviewed: ? ? ?Assessment and Plan: ? ?Right-sided weakness ?-Almost resolved, clinically suspect TIA ?-Continue aspirin and Plavix ?-Has an MRI conditional pacemaker, had MRI in January at

## 2022-03-16 NOTE — ED Notes (Signed)
Patient transported to CT 

## 2022-03-16 NOTE — Progress Notes (Signed)
Pt has not been able to void, has attempted multiple times. Pt verbalizes urge to pee cant. Bladder scan completed. Showed 941ms. Pt stated he had issues with starting stream but it has worsened.  ?

## 2022-03-17 ENCOUNTER — Observation Stay (HOSPITAL_COMMUNITY): Payer: Medicare Other

## 2022-03-17 ENCOUNTER — Encounter (HOSPITAL_COMMUNITY): Payer: Medicare Other

## 2022-03-17 DIAGNOSIS — I63532 Cerebral infarction due to unspecified occlusion or stenosis of left posterior cerebral artery: Secondary | ICD-10-CM | POA: Diagnosis not present

## 2022-03-17 DIAGNOSIS — I63232 Cerebral infarction due to unspecified occlusion or stenosis of left carotid arteries: Secondary | ICD-10-CM | POA: Diagnosis not present

## 2022-03-17 DIAGNOSIS — G459 Transient cerebral ischemic attack, unspecified: Secondary | ICD-10-CM | POA: Diagnosis not present

## 2022-03-17 DIAGNOSIS — I2510 Atherosclerotic heart disease of native coronary artery without angina pectoris: Secondary | ICD-10-CM

## 2022-03-17 DIAGNOSIS — Z950 Presence of cardiac pacemaker: Secondary | ICD-10-CM

## 2022-03-17 LAB — RAPID URINE DRUG SCREEN, HOSP PERFORMED
Amphetamines: NOT DETECTED
Barbiturates: NOT DETECTED
Benzodiazepines: NOT DETECTED
Cocaine: NOT DETECTED
Opiates: NOT DETECTED
Tetrahydrocannabinol: NOT DETECTED

## 2022-03-17 LAB — LIPID PANEL
Cholesterol: 60 mg/dL (ref 0–200)
HDL: <10 mg/dL — ABNORMAL LOW (ref >40)
Triglycerides: 205 mg/dL — ABNORMAL HIGH (ref <150)
VLDL: 41 mg/dL — ABNORMAL HIGH (ref 0–40)

## 2022-03-17 LAB — LDL CHOLESTEROL, DIRECT: Direct LDL: 22.1 mg/dL (ref 0–99)

## 2022-03-17 MED ORDER — UMECLIDINIUM BROMIDE 62.5 MCG/ACT IN AEPB
1.0000 | INHALATION_SPRAY | Freq: Every day | RESPIRATORY_TRACT | Status: DC
  Filled 2022-03-17 (×2): qty 7

## 2022-03-17 MED ORDER — CHLORHEXIDINE GLUCONATE CLOTH 2 % EX PADS
6.0000 | MEDICATED_PAD | Freq: Every day | CUTANEOUS | Status: DC
  Administered 2022-03-17 – 2022-03-18 (×2): 6 via TOPICAL

## 2022-03-17 MED ORDER — ENOXAPARIN SODIUM 40 MG/0.4ML IJ SOSY
40.0000 mg | PREFILLED_SYRINGE | INTRAMUSCULAR | Status: DC
  Administered 2022-03-17: 40 mg via SUBCUTANEOUS
  Filled 2022-03-17: qty 0.4

## 2022-03-17 MED ORDER — BISACODYL 10 MG RE SUPP: 10.0000 mg | Freq: Every day | RECTAL | Status: DC | PRN

## 2022-03-17 MED ORDER — IOHEXOL 350 MG/ML SOLN
100.0000 mL | Freq: Once | INTRAVENOUS | Status: AC | PRN
  Administered 2022-03-17: 100 mL via INTRAVENOUS

## 2022-03-17 NOTE — Progress Notes (Signed)
STROKE TEAM PROGRESS NOTE  ? ?INTERVAL HISTORY ?His wife is at the bedside. Pt stated that for the past 3 weeks, he has had persistent diarrhea, has seen by PCP and doing CT rule out.  Yesterday morning, he went to bathroom and felt generalized weakness, both leg weakness, not able to walk, his wife can not get him off the floor, his son helped him out and he was sent to bed. Son felt his right leg weaker than left. Later, when EMS arrived, his leg weakness getting better.  ? ?Vitals:  ? 03/16/22 2029 03/16/22 2336 03/17/22 0342 03/17/22 0727  ?BP: 126/67 129/73 119/71 119/71  ?Pulse: 60 60 63 62  ?Resp: (!) '22 18 16 18  '$ ?Temp: (!) 97.5 ?F (36.4 ?C) 97.7 ?F (36.5 ?C) 98.3 ?F (36.8 ?C) 98.1 ?F (36.7 ?C)  ?TempSrc: Oral Oral Oral Oral  ?SpO2: 98% 95% 96% 98%  ?Weight:      ?Height:      ? ?CBC:  ?Recent Labs  ?Lab 03/16/22 ?1137 03/16/22 ?1147 03/16/22 ?1647  ?WBC 3.3*  --  3.0*  ?NEUTROABS 2.2  --   --   ?HGB 11.2* 10.9* 11.3*  ?HCT 34.8* 32.0* 34.7*  ?MCV 92.8  --  91.1  ?PLT PLATELET CLUMPS NOTED ON SMEAR, UNABLE TO ESTIMATE  --  121*  ? ?Basic Metabolic Panel:  ?Recent Labs  ?Lab 03/16/22 ?1137 03/16/22 ?1147 03/16/22 ?1647  ?NA 140 140  --   ?K 3.7 3.7  --   ?CL 105 106  --   ?CO2 25  --   --   ?GLUCOSE 120* 118*  --   ?BUN 15 17  --   ?CREATININE 1.09 1.00 1.07  ?CALCIUM 8.8*  --   --   ? ?Lipid Panel:  ?Recent Labs  ?Lab 03/17/22 ?0128  ?CHOL 60  ?TRIG 205*  ?HDL <10*  ?CHOLHDL NOT CALCULATED  ?VLDL 41*  ?LDLCALC NOT CALCULATED  ? ?HgbA1c:  ?Recent Labs  ?Lab 03/16/22 ?1647  ?HGBA1C 5.4  ? ?Urine Drug Screen: No results for input(s): LABOPIA, COCAINSCRNUR, LABBENZ, AMPHETMU, THCU, LABBARB in the last 168 hours.  ?Alcohol Level  ?Recent Labs  ?Lab 03/16/22 ?1137  ?ETH <10  ? ? ?IMAGING past 24 hours ?CT ANGIO HEAD NECK W WO CM ? ?Result Date: 03/17/2022 ?CLINICAL DATA:  Neuro deficit, stroke suspected EXAM: CT ANGIOGRAPHY HEAD AND NECK TECHNIQUE: Multidetector CT imaging of the head and neck was performed using  the standard protocol during bolus administration of intravenous contrast. Multiplanar CT image reconstructions and MIPs were obtained to evaluate the vascular anatomy. Carotid stenosis measurements (when applicable) are obtained utilizing NASCET criteria, using the distal internal carotid diameter as the denominator. RADIATION DOSE REDUCTION: This exam was performed according to the departmental dose-optimization program which includes automated exposure control, adjustment of the mA and/or kV according to patient size and/or use of iterative reconstruction technique. CONTRAST:  153m OMNIPAQUE IOHEXOL 350 MG/ML SOLN COMPARISON:  CTA 11/20/2021, CT head 03/16/2022. FINDINGS: CT HEAD FINDINGS Brain: No evidence of acute infarction, hemorrhage, cerebral edema, mass, mass effect, or midline shift. No hydrocephalus or extra-axial fluid collection. Hypodensity in the left parietal white matter and splenium of the corpus callosum correlate with infarcts from the 11/20/2021 MRI. Vascular: No hyperdense vessel. Skull: Normal. Negative for fracture or focal lesion. Sinuses/Orbits: Mucosal thickening in the ethmoid air cells and maxillary sinuses. The orbits are unremarkable. Other: The mastoid air cells are well aerated. CTA NECK FINDINGS Aortic arch: Standard branching. Imaged  portion shows no evidence of aneurysm or dissection. No significant stenosis of the major arch vessel origins. Right carotid system: No evidence of dissection, stenosis (50% or greater) or occlusion. Left carotid system: No evidence of dissection, stenosis (50% or greater) or occlusion. Redemonstrated approximately 40% stenosis in the proximal left ICA. Vertebral arteries: Moderate narrowing at the origin of the bilateral vertebral arteries, unchanged. No evidence of dissection or occlusion. Skeleton: Redemonstrated degenerative findings in the cervical spine, with osseous fusion of C5 and C6. No acute osseous abnormality. Other neck: Negative. Upper  chest: Focal pulmonary opacity or pleural effusion. Redemonstrated left chest cardiac device with leads extending off the inferior field of view. Review of the MIP images confirms the above findings CTA HEAD FINDINGS Anterior circulation: Both internal carotid arteries are patent to the termini, without significant stenosis. A1 segments patent. Normal anterior communicating artery. Anterior cerebral arteries are patent to their distal aspects, with a hypoplastic right ACA. No M1 stenosis or occlusion. Normal MCA bifurcations. Distal MCA branches perfused and symmetric. Posterior circulation: Vertebral arteries patent to the vertebrobasilar junction without stenosis. Posterior inferior cerebral arteries patent bilaterally. Basilar patent to its distal aspect. Superior cerebellar arteries patent bilaterally. Patent P1 segments. Redemonstrated severe stenosis and near occlusion of the left PCA in the proximal P2 segment (series 11, image 111), with poor perfusion of the left PCA distally. Right PCA branches are perfused to their distal aspects without focal stenosis. The bilateral posterior communicating arteries are not visualized. Venous sinuses: As permitted by contrast timing, patent. Redemonstrated hypoplastic right transverse sinus, with dominant left transverse sinus Anatomic variants: None significant. Review of the MIP images confirms the above findings IMPRESSION: 1.  No acute intracranial process. 2. No intracranial large vessel occlusion. Unchanged severe stenosis in the proximal left P2 segment with poor perfusion of the remainder of the left PCA. 3. 40% stenosis in the proximal left ICA, unchanged. 4. Moderate stenosis of the origin of the bilateral vertebral arteries, which are otherwise patent. Electronically Signed   By: Merilyn Baba M.D.   On: 03/17/2022 01:46  ? ?CT HEAD WO CONTRAST ? ?Result Date: 03/16/2022 ?CLINICAL DATA:  Neuro deficit, acute, stroke suspected EXAM: CT HEAD WITHOUT CONTRAST  TECHNIQUE: Contiguous axial images were obtained from the base of the skull through the vertex without intravenous contrast. RADIATION DOSE REDUCTION: This exam was performed according to the departmental dose-optimization program which includes automated exposure control, adjustment of the mA and/or kV according to patient size and/or use of iterative reconstruction technique. COMPARISON:  11/20/2021 FINDINGS: Brain: There is no acute intracranial hemorrhage, mass effect, or edema. Gray-white differentiation is preserved. Patchy hypoattenuation in the supratentorial white matter likely reflecting a combination of chronic microvascular ischemic changes and sequelae of infarcts on the prior MRI. A small superimposed acute infarction is difficult to exclude without an interval baseline CT comparison. There is no extra-axial fluid collection. Ventricles and sulci are within normal limits in size and configuration. Vascular: No hyperdense vessel.There is atherosclerotic calcification at the skull base. Skull: Calvarium is unremarkable. Sinuses/Orbits: No acute finding. Other: None. IMPRESSION: No acute intracranial hemorrhage. No acute infarction. An acute small vessel infarct superimposed on chronic changes is difficult to exclude. Electronically Signed   By: Macy Mis M.D.   On: 03/16/2022 12:22  ? ?EEG adult ? ?Result Date: 03/17/2022 ?Derek Jack, MD     03/17/2022  7:38 AM Routine EEG Report JAMON HAYHURST is a 66 y.o. male with a history of TIAs who  is undergoing an EEG to evaluate for seizures. Report: This EEG was acquired with electrodes placed according to the International 10-20 electrode system (including Fp1, Fp2, F3, F4, C3, C4, P3, P4, O1, O2, T3, T4, T5, T6, A1, A2, Fz, Cz, Pz). The following electrodes were missing or displaced: none. The occipital dominant rhythm was 9-10 Hz. This activity is reactive to stimulation. Drowsiness was manifested by background fragmentation; deeper stages of sleep  were not identified. There was no focal slowing. There were no interictal epileptiform discharges. There were no electrographic seizures identified. Photic stimulation and hyperventilation were not performed.

## 2022-03-17 NOTE — Progress Notes (Signed)
Initial Nutrition Assessment ? ?DOCUMENTATION CODES:  ?Not applicable ? ?INTERVENTION:  ?Continue current diet as ordered ?Continue Ensure Enlive po BID, each supplement provides 350 kcal and 20 grams of protein. ? ?NUTRITION DIAGNOSIS:  ?Inadequate oral intake related to decreased appetite as evidenced by per patient/family report (diarrhea x 3 weeks). ? ?GOAL:  ?Patient will meet greater than or equal to 90% of their needs ? ?MONITOR:  ?PO intake, Supplement acceptance, Labs ? ?REASON FOR ASSESSMENT:  ?Malnutrition Screening Tool ?  ? ?ASSESSMENT:  ?66 y.o. male with hx of HLD, HTN, CAD, GERD, hypothyroidism, COPD, cirrhosis, DM type 2, and prior stroke, presented to ED with worsening weakness. Also reports a 3 week hx of diarrhea.    ? ?Neurology currently consulting and believes presentation most consistent with TIA. Pt reports that stool studies are pending with his PCP to evaluate weeks of diarrhea.  ? ?Unable to reach pt on room phone at this time. So far, intake this admission appears good with 100% of 1 recorded meal consumed. Reviewed weight hx and 4% weight loss noted in the last 3 months, which is not severe for this timeframe (1/13-4/30) ? ?Supplements in place at this time as pt reported poor PO and weight loss on admission, will continue to augment intake. Lipid panel abnormal, will leave diet as ordered as so far pt has good intake. Will consider liberalizing if intake declines. ? ?Average Meal Intake: ?4/30: 100% intake x 1 recorded meals ? ?Nutritionally Relevant Medications: ?Scheduled Meds: ? ezetimibe  10 mg Oral q AM  ? Ensure Enlive  237 mL Oral BID BM  ? levothyroxine  112 mcg Oral QAC breakfast  ? pantoprazole  40 mg Oral QPC supper  ? psyllium  1 packet Oral Daily  ? rosuvastatin  5 mg Oral Daily  ? vitamin B-12  1,000 mcg Oral q AM  ? ?Labs Reviewed ? ?NUTRITION - FOCUSED PHYSICAL EXAM: ?Defer to in-person assessment ? ?Diet Order:   ?Diet Order   ? ?       ?  Diet heart healthy/carb  modified Room service appropriate? Yes; Fluid consistency: Thin  Diet effective now       ?  ? ?  ?  ? ?  ? ? ?EDUCATION NEEDS:  ?No education needs have been identified at this time ? ?Skin:  Skin Assessment: Reviewed RN Assessment ? ?Last BM:  4/30 ? ?Height:  ?Ht Readings from Last 1 Encounters:  ?03/16/22 '5\' 9"'$  (1.753 m)  ? ? ?Weight:  ?Wt Readings from Last 1 Encounters:  ?03/16/22 95.3 kg  ? ? ?Ideal Body Weight:  72.7 kg ? ?BMI:  Body mass index is 31.01 kg/m?. ? ?Estimated Nutritional Needs:  ?Kcal:  2000-2200 kcal/d ?Protein:  100-115 g/d ?Fluid:  >/=2.2 L/d ? ? ?Ranell Patrick, RD, LDN ?Clinical Dietitian ?RD pager # available in [place]  ?After hours/weekend pager # available in [place] ?

## 2022-03-17 NOTE — Progress Notes (Signed)
Per order, Changed device settings for MRI to DOO at 85 bpm   Tachy-therapies to off if applicable.   Will program device back to pre-MRI settings after completion of exam. 

## 2022-03-17 NOTE — Progress Notes (Signed)
Physical Therapy Treatment ?Patient Details ?Name: Austin Oliver ?MRN: [id number] ?DOB: 09-Aug-1956 ?Today's Date: 03/17/2022 ? ? ?History of Present Illness The pt is a 66 yo male presenting 4/30 with acute onset R-sided weakness that resolved by the time EMS arrived. 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 ? ?  ?PT Comments  ? ? Pt received in supine, pleasantly agreeable to therapy session, spouse present and encouraging. Emphasis on gait and transfer training. Pt scored 15/24 on Dynamic Gait Index. Scores of 19 or less are predictive of falls in older community living adults. Pt needing up to min guard for safety with gait/transfers using RW. Plan to trial cane next session, pt reporting increased R knee pain today so used RW for more stability.Pt continues to benefit from PT services to progress toward functional mobility goals.    ?Recommendations for follow up therapy are one component of a multi-disciplinary discharge planning process, led by the attending physician.  Recommendations may be updated based on patient status, additional functional criteria and insurance authorization. ? ?Follow Up Recommendations ? Outpatient PT (neuro OP PT) ?  ?  ?Assistance Recommended at Discharge Intermittent Supervision/Assistance  ?Patient can return home with the following A little help with walking and/or transfers;A little help with bathing/dressing/bathroom;Assistance with cooking/housework;Direct supervision/assist for medications management;Direct supervision/assist for financial management;Assist for transportation;Help with stairs or ramp for entrance ?  ?Equipment Recommendations ? None recommended by PT  ?  ?Recommendations for Other Services   ? ? ?  ?Precautions / Restrictions Precautions ?Precautions: Fall ?Precaution Comments: hx of falls ?Restrictions ?Weight Bearing Restrictions: No  ?   ? ?Mobility ? Bed Mobility ?Overal bed mobility: Independent ?  ?  ?  ?  ?  ?  ?General bed mobility comments: increased time to sit up on R side of bed ?  ? ?Transfers ?Overall transfer level: Needs assistance ?Equipment used: Rolling walker (2 wheels) ?Transfers: Sit to/from Stand ?Sit to Stand: Min guard ?  ?  ?  ?  ?  ?General transfer comment: good hand placement, no assistance needed; from EOB<>RW able to stand without using UE ?  ? ?Ambulation/Gait ?Ambulation/Gait assistance: Min guard ?Gait Distance (Feet): 250 Feet ?Assistive device: Rolling walker (2 wheels) ?Gait Pattern/deviations: Step-through pattern, Decreased stride length ?  ?  ?  ?General Gait Details: No overt LOB but benefits from BUE support. able to tolerate small balance challenges with gait, see DGI ? ? ?Stairs ?  ?  ?  ?  ?  ? ? ?Wheelchair Mobility ?  ? ?Modified Rankin (Stroke Patients Only) ?  ? ? ?  ?Balance Overall balance assessment: History of Falls, Needs assistance ?Sitting-balance support: No upper extremity supported, Feet supported ?Sitting balance-Leahy Scale: Good ?Sitting balance - Comments: can reach towards feet without LOB ?  ?Standing balance support: Bilateral upper extremity supported, During functional activity ?Standing balance-Leahy Scale: Fair ?Standing balance comment: static stand without UE support, BUE support for gait ?  ?  ?  ?  ?  ?  ?  ?  ?Standardized Balance Assessment ?Standardized Balance Assessment : Dynamic Gait Index ?  ?Dynamic Gait Index ?Level Surface: Mild Impairment ?Change in Gait Speed: Mild Impairment ?Gait with Horizontal Head Turns: Mild Impairment ?Gait with Vertical Head Turns: Mild Impairment ?Gait and Pivot Turn: Mild Impairment ?Step Over Obstacle: Mild Impairment ?Step Around Obstacles: Mild Impairment ?Steps: Moderate Impairment ((anticipated,  pt defer due to R knee pain but able to verbalize sequencing and able to march with high knees to demonstrate significant hip flexion  strength)) ?Total Score: 15 ?  ? ?  ?Cognition Arousal/Alertness: Awake/alert ?Behavior During Therapy: [facility] for tasks assessed/performed ?Overall Cognitive Status: Impaired/Different from baseline ?Area of Impairment: Memory, Following commands ?  ?  ?  ?  ?  ?  ?  ?  ?  ?  ?Memory: Decreased short-term memory ?Following Commands: Follows one step commands with increased time, Follows one step commands consistently ?  ?  ?Problem Solving: Requires verbal cues ?General Comments: verbose, verbal cues needed at times to maintain focus, address safety. pt able to follow all instructions but somewhat HoH at times. ?  ?  ? ?  ?Exercises   ? ?  ?General Comments General comments (skin integrity, edema, etc.): VSS on RA ?  ?  ? ?Pertinent Vitals/Pain Pain Assessment ?Pain Assessment: Faces ?Faces Pain Scale: Hurts little more ?Pain Location: R knee with mobility ?Pain Descriptors / Indicators: Discomfort, Grimacing ?Pain Intervention(s): Monitored during session, Repositioned, Ice applied  ? ? ?Home Living   ?  ?  ?  ?  ?  ?  ?  ?  ?  ?   ?  ?Prior Function    ?  ?  ?   ? ?PT Goals (current goals can now be found in the care plan section) Acute Rehab PT Goals ?Patient Stated Goal: return home and resume OPPT ?PT Goal Formulation: With patient ?Time For Goal Achievement: 03/30/22 ?Progress towards PT goals: Progressing toward goals ? ?  ?Frequency ? ? ? Min 3X/week ? ? ? ?  ?PT Plan Current plan remains appropriate  ? ? ?Co-evaluation   ?  ?  ?  ?  ? ?  ?AM-PAC PT "6 Clicks" Mobility   ?Outcome Measure ? Help needed turning from your back to your side while in a flat bed without using bedrails?: None ?Help needed moving from lying on your back to sitting on the side of a flat bed without using bedrails?: None ?Help needed moving to and from a bed to a chair (including a wheelchair)?: A Little ?Help needed standing up from a chair using your arms (e.g., wheelchair or bedside chair)?: A Little ?Help needed to walk in hospital  room?: A Little ?Help needed climbing 3-5 steps with a railing? : A Lot (anticipate due to R knee pain) ?6 Click Score: 19 ? ?  ?End of Session Equipment Utilized During Treatment: Gait belt ?Activity Tolerance: Patient tolerated treatment well ?Patient left: in bed;with call bell/phone within reach;with family/visitor present (spouse present) ?Nurse Communication: Mobility status;Other (comment) (his bed won't plug in to charge, he needs a new bed) ?PT Visit Diagnosis: Other abnormalities of gait and mobility (R26.89);History of falling (Z91.81);Muscle weakness (generalized) (M62.81) ?  ? ? ?Time: 0932-6712 ?PT Time Calculation (min) (ACUTE ONLY): 25 min ? ?Charges:  $Gait Training: 23-37 mins          ?          ? ?Cassidie Veiga P., PTA ?Acute Rehabilitation Services ?Secure Chat Preferred 9a-5:30pm ?Office: [phone]  ? ? ?Kara Pacer Mari[place]osang [place]ns ?03/17/2022, 5:23 PM ? ?

## 2022-03-17 NOTE — Evaluation (Addendum)
Occupational Therapy Evaluation ?Patient Details ?Name: Austin Oliver ?MRN: [id number] ?DOB: Dec 23, 1955 ?Today's Date: 03/17/2022 ? ? ?History of Present Illness The pt is a 66 yo male presenting 4/30 with acute onset R-sided weakness that resolved by the time EMS arrived. 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  ? ?Clinical Impression ?  ?Pt reports independence with ADLs and uses cane/RW for functional mobility. Lives with spouse who can provide assist at d/c. Pt currently set up-min guard for standing ADLs, independent with bed mobility, and min guard for transfers with RW. Pt tangential at times, with decreased memory and attention to task requiring increased verbal cuing for multistep instructions. Recommended pt receive supervision from wife/son for medication and financial mgmt tasks, pt verbalized understanding. Pt presenting with impairments listed below, however has no acute OT needs at this time. Will s/o. Please reconsult if there is a change in pt status. ?   ? ?Recommendations for follow up therapy are one component of a multi-disciplinary discharge planning process, led by the attending physician.  Recommendations may be updated based on patient status, additional functional criteria and insurance authorization.  ? ?Follow Up Recommendations ? No OT follow up  ?  ?Assistance Recommended at Discharge Set up Supervision/Assistance  ?Patient can return home with the following Direct supervision/assist for medications management;Direct supervision/assist for financial management;Assistance with cooking/housework;Help with stairs or ramp for entrance;Assist for transportation;A little help with walking and/or transfers;A little help with bathing/dressing/bathroom ? ?  ?Functional Status Assessment ? Patient has had a recent decline in their functional status and demonstrates the ability to  make significant improvements in function in a reasonable and predictable amount of time.  ?Equipment Recommendations ? None recommended by OT  ?  ?Recommendations for Other Services   ? ? ?  ?Precautions / Restrictions Precautions ?Precautions: Fall ?Precaution Comments: hx of falls ?Restrictions ?Weight Bearing Restrictions: No  ? ?  ? ?Mobility Bed Mobility ?Overal bed mobility: Independent ?  ?  ?  ?  ?  ?  ?  ?  ? ?Transfers ?Overall transfer level: Needs assistance ?Equipment used: Rolling walker (2 wheels) ?Transfers: Sit to/from Stand ?Sit to Stand: Min guard ?  ?  ?  ?  ?  ?General transfer comment: good hand placement, no assistance needed ?  ? ?  ?Balance Overall balance assessment: History of Falls, Needs assistance ?Sitting-balance support: No upper extremity supported, Feet supported ?Sitting balance-Leahy Scale: Good ?Sitting balance - Comments: can reach towards feet without LOB ?  ?Standing balance support: Bilateral upper extremity supported, During functional activity ?Standing balance-Leahy Scale: Fair ?Standing balance comment: static stand without UE support, BUE support for gait ?  ?  ?  ?  ?  ?  ?  ?  ?  ?  ?  ?   ? ?ADL either performed or assessed with clinical judgement  ? ?ADL Overall ADL's : Needs assistance/impaired ?Eating/Feeding: Set up;Sitting ?  ?Grooming: Set up;Sitting;Wash/dry hands;Wash/dry face;Oral care ?  ?Upper Body Bathing: Set up;Sitting ?  ?Lower Body Bathing: Set up;Sit to/from stand ?  ?Upper Body Dressing : Set up;Sitting ?  ?Lower Body Dressing: Set up;Sitting/lateral leans;Sit to/from stand ?Lower Body Dressing Details (indicate cue type and reason): to don/doff socks/shoes ?Toilet Transfer: Min guard;Rolling walker (2 wheels);Cueing for safety ?  ?Toileting- Clothing Manipulation and Hygiene: Supervision/safety;Sitting/lateral lean;Sit to/from stand ?  ?  ?  ?  Functional mobility during ADLs: Min guard;Rolling walker (2 wheels);Cueing for sequencing;Cueing for  safety ?   ? ? ? ?Vision Baseline Vision/History: 1 Wears glasses ?Vision Assessment?: No apparent visual deficits  ?   ?Perception Perception ?Comments: completes functional ADLs, reports no changes in vision, able to read paragraph from food menu without skipping words ?  ?Praxis   ?  ? ?Pertinent Vitals/Pain Pain Assessment ?Pain Assessment: Faces ?Pain Score: 1  ?Faces Pain Scale: Hurts a little bit ?Pain Location: R knee with mobility ?Pain Descriptors / Indicators: Discomfort ?Pain Intervention(s): Limited activity within patient's tolerance, Monitored during session, Repositioned  ? ? ? ?Hand Dominance Right ?  ?Extremity/Trunk Assessment Upper Extremity Assessment ?Upper Extremity Assessment: Overall WFL for tasks assessed ?  ?Lower Extremity Assessment ?Lower Extremity Assessment: Defer to PT evaluation ?  ?Cervical / Trunk Assessment ?Cervical / Trunk Assessment: Normal ?  ?Communication Communication ?Communication: No difficulties ?  ?Cognition Arousal/Alertness: Awake/alert ?Behavior During Therapy: [facility] for tasks assessed/performed ?Overall Cognitive Status: Impaired/Different from baseline ?Area of Impairment: Memory, Following commands ?  ?  ?  ?  ?  ?  ?  ?  ?  ?  ?Memory: Decreased short-term memory ?Following Commands: Follows one step commands inconsistently, Follows one step commands with increased time ?  ?  ?Problem Solving: Requires verbal cues ?General Comments: verbose, verbal cues needed at times to maintain focus, address safety. pt able to follow all instructions. Reports he has STM deficits from prior CVA. Completes sequential subtraction and reading task accurately and without difficulty. ?  ?  ?General Comments  VSS On RA ? ?  ?Exercises   ?  ?Shoulder Instructions    ? ? ?Home Living Family/patient expects to be discharged to:: Private residence ?Living Arrangements: Spouse/significant other ?Available Help at Discharge: Family ?Type of Home: House ?Home Access: Stairs to  enter ?Entrance Stairs-Number of Steps: 1 ?Entrance Stairs-Rails: None ?Home Layout: Multi-level;Able to live on main level with bedroom/bathroom ?  ?  ?Bathroom Shower/Tub: Walk-in shower ?  ?Bathroom Toilet: Standard ?  ?  ?Home Equipment: Rolling Walker (2 wheels);BSC/3in1;Cane - single point;Shower seat;Grab bars - toilet;Grab bars - tub/shower ?  ?  ?  ? ?  ?Prior Functioning/Environment Prior Level of Function : Independent/Modified Independent ?  ?  ?  ?  ?  ?  ?Mobility Comments: had progressed to use of cane with OPPT, started using RW again due to weakness from diarrhea ?ADLs Comments: pt reports independence ?  ? ?  ?  ?OT Problem List: Decreased strength;Decreased range of motion;Decreased activity tolerance;Impaired balance (sitting and/or standing);Decreased safety awareness;Impaired UE functional use;Decreased cognition;Decreased knowledge of use of DME or AE ?  ?   ?OT Treatment/Interventions:    ?  ?OT Goals(Current goals can be found in the care plan section) Acute Rehab OT Goals ?Patient Stated Goal: none stated ?OT Goal Formulation: With patient ?Time For Goal Achievement: 03/31/22 ?Potential to Achieve Goals: Good  ?OT Frequency:   ?  ? ?Co-evaluation   ?  ?  ?  ?  ? ?  ?AM-PAC OT "6 Clicks" Daily Activity     ?Outcome Measure Help from another person eating meals?: None ?Help from another person taking care of personal grooming?: None ?Help from another person toileting, which includes using toliet, bedpan, or urinal?: A Little ?Help from another person bathing (including washing, rinsing, drying)?: A Little ?Help from another person to put on and taking off regular upper body clothing?: None ?Help from another person  to put on and taking off regular lower body clothing?: A Little ?6 Click Score: 21 ?  ?End of Session Equipment Utilized During Treatment: Gait belt;Rolling walker (2 wheels) ?Nurse Communication: Mobility status ? ?Activity Tolerance: Patient tolerated treatment well ?Patient  left: in chair;with call bell/phone within reach;with chair alarm set ? ?OT Visit Diagnosis: Unsteadiness on feet (R26.81);Other abnormalities of gait and mobility (R26.89);Muscle weakness (generalized) (M62.81);History o

## 2022-03-17 NOTE — Progress Notes (Signed)
Informed of MRI for today.  ? ?Device system confirmed to be MRI conditional, with implant date > 6 weeks ago, and no evidence of abandoned or epicardial leads in review of most recent CXR ?Interrogation from today reviewed, pt is currently AP-VS  ? ?Change device settings for MRI to DOO at 85 bpm ? ?Tachy-therapies to off if applicable. ? ?Program device back to pre-MRI settings after completion of exam. ? ?Shirley Friar, PA-C  ?03/17/2022 2:49 PM   ?

## 2022-03-17 NOTE — Plan of Care (Signed)
  Problem: Education: Goal: Knowledge of General Education information will improve Description Including pain rating scale, medication(s)/side effects and non-pharmacologic comfort measures Outcome: Progressing   Problem: Health Behavior/Discharge Planning: Goal: Ability to manage health-related needs will improve Outcome: Progressing   

## 2022-03-17 NOTE — Care Management Obs Status (Signed)
MEDICARE OBSERVATION STATUS NOTIFICATION ? ? ?Patient Details  ?Name: Austin Oliver ?MRN: [id number] ?Date of Birth: July 07, 1956 ? ? ?Medicare Observation Status Notification Given:  Yes ? ? ? ?Pollie Friar, RN ?03/17/2022, 11:53 AM ?

## 2022-03-17 NOTE — Progress Notes (Signed)
?PROGRESS NOTE ? ? ? ?Austin Oliver  ZTI:[id number] DOB: 11/09/1956 DOA: 03/16/2022 ?PCP: [name], MD  ?66 y.o. male with medical history significant of CAD, COPD, sick sinus syndrome status post pacer, liver cirrhosis, history of cryptogenic embolic strokes in January 23 in the setting of COVID, which resulted in left leg weakness.  Patient has been improving with physical therapy. ?-For the last 3 weeks has been having some diarrhea, recently saw his PCP for this, was advised to stop metformin, plan for GI pathogen panel etc., took an antidiarrheal few days ago and diarrhea has slowed down since, ?-4/30 morning when he woke up he was weak all over, could not get out of bed, could not stand up, eventually when he was able to get up and start moving he was dragging his right leg and also felt weak and shaky.  He has some residual left-sided weakness following recent stroke in January. ?-EMS was called, after EMS arrived over time started regaining strength in his right leg. ? ? ?Subjective: ?-Overnight had urinary retention, had Foley catheter placed, 1.5L of urine drained ? ?Assessment and Plan: ? ?Right-sided weakness ?-Almost resolved, clinically suspect TIA ?-Continue aspirin and Plavix ?-Has an MRI conditional pacemaker, had MRI in January at the time of recent CVA ?-MRI to be completed in radiology this afternoon ?-Had multiple embolic strokes which were characterized as cryptogenic in the setting of acute COVID-19 infection in 1/23 has multiple cardiovascular risk factors ?-LDL could not be calculated, A1c is 5.4 ?-Echo in 1/23 noted preserved EF, no significant valvular disease, CTA 1/23 was negative for large vessel occlusion ?-Neurology following ? ?Acute urinary retention ?-History of BPH, continue Flomax and Proscar, overnight had worsening retention requiring Foley catheter placement ?-Attempt voiding trial this afternoon after MRI scanning completed ?-Followed by Dr. Gloriann Loan, urology ?   ?History of CAD, multiple PCI and stents ?-No symptoms of ACS at this time, continue aspirin, Plavix, Imdur on hold for permissive hypertension, continue Ranexa and Crestor ?  ?Hypertension ?-BP stable at this time, hold Imdur for permissive hypertension ?  ?History of sick sinus syndrome ?-Status post pacer, has a [employer] MR conditional pacemaker ?-Followed by EP, recent pacemaker eval without evidence of A-fib ?  ?Type 2 diabetes mellitus ?-Hold metformin on account of diarrhea, A1c is 5.4 ?  ?COPD ?-Former heavy smoker, stable, no wheezing, continue home nebs regimen ?  ?Liver cirrhosis ?-Etiology of this is unclear, follow-up with gastroenterology, denies EtOH use ?  ?Recent right knee replacement ?  ?Subacute/chronic diarrhea ?-Ongoing for 3 weeks, advised by PCP to hold metformin recently, work-up ongoing just prior to admission ?-Electrolytes unremarkable, no diarrhea in the last couple of days, monitor clinically ?  ? Advance Care Planning: Full Code ?  ?Consults: Neuro called by EDP ?  ?Family Communication: wife and son at bedside yesterday ?Disposition Plan: Home pending neuro work-up and urinary retention ? ?Consultants: Neurology ? ? ?Procedures:  ? ?Antimicrobials:  ? ? ?Objective: ?Vitals:  ? 03/16/22 2029 03/16/22 2336 03/17/22 0342 03/17/22 0727  ?BP: 126/67 129/73 119/71 119/71  ?Pulse: 60 60 63 62  ?Resp: (!) '22 18 16 18  '$ ?Temp: (!) 97.5 ?F (36.4 ?C) 97.7 ?F (36.5 ?C) 98.3 ?F (36.8 ?C) 98.1 ?F (36.7 ?C)  ?TempSrc: Oral Oral Oral Oral  ?SpO2: 98% 95% 96% 98%  ?Weight:      ?Height:      ? ? ?Intake/Output Summary (Last 24 hours) at 03/17/2022 1418 ?Last data filed at  03/17/2022 1218 ?Gross per 24 hour  ?Intake 480 ml  ?Output 3165 ml  ?Net -2685 ml  ? ?Filed Weights  ? 03/16/22 1045  ?Weight: 95.3 kg  ? ? ?Examination: ? ?General exam: Appears calm and comfortable  ?Respiratory system: Clear to auscultation ?Cardiovascular system: S1 & S2 heard, RRR.  ?Abd: nondistended, soft and nontender.Normal  bowel sounds heard. ?Central nervous system: Alert and oriented.  Mild residual left leg weakness ?Extremities: no edema ?Skin: No rashes ?Psychiatry:  Mood & affect appropriate.  ? ? ? ?Data Reviewed:  ? ?CBC: ?Recent Labs  ?Lab 03/16/22 ?1137 03/16/22 ?1147 03/16/22 ?1647  ?WBC 3.3*  --  3.0*  ?NEUTROABS 2.2  --   --   ?HGB 11.2* 10.9* 11.3*  ?HCT 34.8* 32.0* 34.7*  ?MCV 92.8  --  91.1  ?PLT PLATELET CLUMPS NOTED ON SMEAR, UNABLE TO ESTIMATE  --  121*  ? ?Basic Metabolic Panel: ?Recent Labs  ?Lab 03/16/22 ?1137 03/16/22 ?1147 03/16/22 ?1647  ?NA 140 140  --   ?K 3.7 3.7  --   ?CL 105 106  --   ?CO2 25  --   --   ?GLUCOSE 120* 118*  --   ?BUN 15 17  --   ?CREATININE 1.09 1.00 1.07  ?CALCIUM 8.8*  --   --   ? ?GFR: ?Estimated Creatinine Clearance: 76.3 mL/min (by C-G formula based on SCr of 1.07 mg/dL). ?Liver Function Tests: ?Recent Labs  ?Lab 03/16/22 ?1137  ?AST 20  ?ALT 14  ?ALKPHOS 52  ?BILITOT 1.5*  ?PROT 6.3*  ?ALBUMIN 3.3*  ? ?No results for input(s): LIPASE, AMYLASE in the last 168 hours. ?Recent Labs  ?Lab 03/16/22 ?1137  ?AMMONIA 18  ? ?Coagulation Profile: ?Recent Labs  ?Lab 03/16/22 ?1137  ?INR 1.2  ? ?Cardiac Enzymes: ?No results for input(s): CKTOTAL, CKMB, CKMBINDEX, TROPONINI in the last 168 hours. ?BNP (last 3 results) ?Recent Labs  ?  04/04/21 ?1449  ?PROBNP 46  ? ?HbA1C: ?Recent Labs  ?  03/16/22 ?0258  ?HGBA1C 5.4  ? ?CBG: ?No results for input(s): GLUCAP in the last 168 hours. ?Lipid Profile: ?Recent Labs  ?  03/17/22 ?0128  ?CHOL 60  ?HDL <10*  ?LDLCALC NOT CALCULATED  ?TRIG 205*  ?CHOLHDL NOT CALCULATED  ?LDLDIRECT 22.1  ? ?Thyroid Function Tests: ?No results for input(s): TSH, T4TOTAL, FREET4, T3FREE, THYROIDAB in the last 72 hours. ?Anemia Panel: ?No results for input(s): VITAMINB12, FOLATE, FERRITIN, TIBC, IRON, RETICCTPCT in the last 72 hours. ?Urine analysis: ?   ?Component Value Date/Time  ? COLORURINE AMBER (A) 03/16/2022 1043  ? APPEARANCEUR CLOUDY (A) 03/16/2022 1043  ? LABSPEC 1.020  03/16/2022 1043  ? PHURINE 5.0 03/16/2022 1043  ? GLUCOSEU NEGATIVE 03/16/2022 1043  ? HGBUR NEGATIVE 03/16/2022 1043  ? [place] NEGATIVE 03/16/2022 1043  ? [place] NEGATIVE 03/16/2022 1043  ? PROTEINUR NEGATIVE 03/16/2022 1043  ? UROBILINOGEN 1.0 04/25/2008 2248  ? NITRITE NEGATIVE 03/16/2022 1043  ? LEUKOCYTESUR NEGATIVE 03/16/2022 1043  ? ?Sepsis Labs: ?'@LABRCNTIP'$ (procalcitonin:4,lacticidven:4) ? ?) ?Recent Results (from the past 240 hour(s))  ?Resp Panel by RT-PCR (Flu A&B, Covid) Nasopharyngeal Swab     Status: None  ? Collection Time: 03/16/22 10:43 AM  ? Specimen: Nasopharyngeal Swab; Nasopharyngeal(NP) swabs in vial transport medium  ?Result Value Ref Range Status  ? SARS Coronavirus 2 by RT PCR NEGATIVE NEGATIVE Final  ?  Comment: (NOTE) ?SARS-CoV-2 target nucleic acids are NOT DETECTED. ? ?The SARS-CoV-2 RNA is generally detectable in upper respiratory ?specimens during  the acute phase of infection. The lowest ?concentration of SARS-CoV-2 viral copies this assay can detect is ?138 copies/mL. A negative result does not preclude SARS-Cov-2 ?infection and should not be used as the sole basis for treatment or ?other patient management decisions. A negative result may occur with  ?improper specimen collection/handling, submission of specimen other ?than nasopharyngeal swab, presence of viral mutation(s) within the ?areas targeted by this assay, and inadequate number of viral ?copies(<138 copies/mL). A negative result must be combined with ?clinical observations, patient history, and epidemiological ?information. The expected result is Negative. ? ?Fact Sheet for Patients:  ?EntrepreneurPulse.com.au ? ?Fact Sheet for Healthcare Providers:  ?IncredibleEmployment.be ? ?This test is no t yet approved or cleared by the [place] FDA and  ?has been authorized for detection and/or diagnosis of SARS-CoV-2 by ?FDA under an Emergency Use Authorization (EUA). This EUA will  remain  ?in effect (meaning this test can be used) for the duration of the ?COVID-19 declaration under Section 564(b)(1) of the Act, 21 ?U.S.C.section 360bbb-3(b)(1), unless the authorization is terminated  ?or

## 2022-03-17 NOTE — TOC Initial Note (Signed)
Transition of Care (TOC) - Initial/Assessment Note  ? ? ?Patient Details  ?Name: Austin Oliver ?MRN: [id number] ?Date of Birth: Jun 10, 1956 ? ?Transition of Care (TOC) CM/SW Contact:    ?Pollie Friar, RN ?Phone Number: ?03/17/2022, 1:00 PM ? ?Clinical Narrative:                 ?Patient is from home with his spouse who can provide needed supervision. Pt was already active with [facility] and wants to continue with their services. CM has sent them new orders.  ?Pt has needed DME at home.  ?Pt manages his own medications at home.  ?Wife is able to provide needed transportation.  ?TOC following. ? ?Expected Discharge Plan: OP Rehab ?Barriers to Discharge: Continued Medical Work up ? ? ?Patient Goals and CMS Choice ?  ?  ?Choice offered to / list presented to : Patient, Spouse ? ?Expected Discharge Plan and Services ?Expected Discharge Plan: OP Rehab ?  ?Discharge Planning Services: CM Consult ?  ?Living arrangements for the past 2 months: [place] ?                ?  ?  ?  ?  ?  ?  ?  ?  ?  ?  ? ?Prior Living Arrangements/Services ?Living arrangements for the past 2 months: [place] ?Lives with:: Spouse ?Patient language and need for interpreter reviewed:: Yes ?Do you feel safe going back to the place where you live?: Yes      ?  ?Care giver support system in place?: Yes (comment) ?Current home services: DME (walker/ cane/ 3 in 1/ shower seat) ?Criminal Activity/Legal Involvement Pertinent to Current Situation/Hospitalization: No - Comment as needed ? ?Activities of Daily Living ?Home Assistive Devices/Equipment: Gilford Rile (specify type), Cane (specify quad or straight) ?ADL Screening (condition at time of admission) ?Patient's cognitive ability adequate to safely complete daily activities?: Yes ?Is the patient deaf or have difficulty hearing?: Yes ?Does the patient have difficulty seeing, even when wearing glasses/contacts?: No ?Does the patient have difficulty concentrating, remembering, or  making decisions?: No ?Patient able to express need for assistance with ADLs?: Yes ?Does the patient have difficulty dressing or bathing?: No ?Independently performs ADLs?: Yes (appropriate for developmental age) ?Does the patient have difficulty walking or climbing stairs?: No ?Weakness of Legs: Right ?Weakness of Arms/Hands: None ? ?Permission Sought/Granted ?  ?  ?   ?   ?   ?   ? ?Emotional Assessment ?Appearance:: Appears stated age ?Attitude/Demeanor/Rapport: Engaged ?Affect (typically observed): Accepting ?Orientation: : Oriented to Self, Oriented to Place, Oriented to  Time, Oriented to Situation ?  ?Psych Involvement: No (comment) ? ?Admission diagnosis:  TIA (transient ischemic attack) [G45.9] ?Patient Active Problem List  ? Diagnosis Date Noted  ? Right sided weakness 03/16/2022  ? TIA (transient ischemic attack) 03/16/2022  ? Cardiac pacemaker in situ 03/05/2022  ? Chronic obstructive pulmonary disease ([place]) 03/05/2022  ? Gastroesophageal reflux disease 01/22/2022  ? Kidney stone 01/22/2022  ? Ultrasound scan abnormal 01/22/2022  ? Unspecified abnormal finding in specimens from other organs, systems and tissues 01/22/2022  ? Unspecified cirrhosis of liver ([place]) 01/22/2022  ? History of CVA (cerebrovascular accident) 11/20/2021  ? Stiffness of right knee 11/04/2021  ? Osteoarthritis of right knee 10/31/2021  ? OSA on CPAP 08/26/2021  ? DOE (dyspnea on exertion) 04/04/2021  ? Anemia due to vitamin B12 deficiency 04/04/2021  ? Statin myopathy 09/18/2020  ? Coronary artery disease involving native coronary  artery of native heart without angina pectoris 09/11/2020  ? Essential hypertension 09/11/2020  ? Aortic atherosclerosis ([place]) 07/31/2020  ? Other pancytopenia ([place]) 03/29/2020  ? Bicytopenia 03/08/2020  ? Pain in right knee 11/22/2019  ? Cervical post-laminectomy syndrome 05/09/2019  ? DDD (degenerative disc disease), cervical 05/09/2019  ? Chronic neck pain 03/15/2019  ? Trigger finger of right hand  02/23/2018  ? Avascular necrosis of hip, left ([place]) 03/09/2017  ? Mixed hyperlipidemia 01/14/2017  ? Coronary artery disease with stable angina pectoris ([place]) 01/14/2017  ? ?PCP:  [name], MD ?Pharmacy:   ?[facility] [place] [address] ?[address] ?[place] [address] ?Phone: [phone] Fax: [phone] ? ?[facility] [place] ?[address] ?[address] ?[place] [address] ?Phone: [phone] Fax: [phone] ? ?[place] ?[facility] [place] [address] ?Phone: [phone] Fax: [phone] ? ?[id number] [place] [address] ?[place]?Phone: [phone]: [phone]? ? ? ? ?Social Determinants of Health (SDOH) Interventions ?  ? ?Readmission Risk Interventions ?   ? View : No data to display.  ?  ?  ?  ? ? ? ?

## 2022-03-17 NOTE — Progress Notes (Signed)
?  X-cover Note: ?Retaining urine again. Bladder scan >900.  Appears foley removed today for voiding trail. Obviously he failed. ? ?Place foley catheter again. He will need outpatient urology appointment. ? ?He is already on flomax and proscar. ? ? ?Kristopher Oppenheim, DO ?[facility] ? ?

## 2022-03-17 NOTE — Procedures (Signed)
Routine EEG Report ? ?Austin Oliver is a 66 y.o. male with a history of TIAs who is undergoing an EEG to evaluate for seizures. ? ?Report: This EEG was acquired with electrodes placed according to the International 10-20 electrode system (including Fp1, Fp2, F3, F4, C3, C4, P3, P4, O1, O2, T3, T4, T5, T6, A1, A2, Fz, Cz, Pz). The following electrodes were missing or displaced: none. ? ?The occipital dominant rhythm was 9-10 Hz. This activity is reactive to stimulation. Drowsiness was manifested by background fragmentation; deeper stages of sleep were not identified. There was no focal slowing. There were no interictal epileptiform discharges. There were no electrographic seizures identified. Photic stimulation and hyperventilation were not performed. ? ?Impression and clinical correlation: This EEG was obtained while awake and drowsy and is normal. One episode of right hand shaking was captured which had no clear ictal correlate on EEG. Surface EEG may not pick up small focal motor seizures, therefore if clinical concern for seizures persists consider additional EEG monitoring. ?   ?Su Monks, MD ?[facility] ?[phone] ? ?If 7pm- 7am, please page neurology on call as listed in [place]. ? ?

## 2022-03-18 DIAGNOSIS — G459 Transient cerebral ischemic attack, unspecified: Secondary | ICD-10-CM | POA: Diagnosis not present

## 2022-03-18 DIAGNOSIS — I679 Cerebrovascular disease, unspecified: Secondary | ICD-10-CM | POA: Diagnosis not present

## 2022-03-18 DIAGNOSIS — I6350 Cerebral infarction due to unspecified occlusion or stenosis of unspecified cerebral artery: Secondary | ICD-10-CM

## 2022-03-18 DIAGNOSIS — Z8673 Personal history of transient ischemic attack (TIA), and cerebral infarction without residual deficits: Secondary | ICD-10-CM | POA: Diagnosis not present

## 2022-03-18 DIAGNOSIS — Z950 Presence of cardiac pacemaker: Secondary | ICD-10-CM | POA: Diagnosis not present

## 2022-03-18 MED ORDER — FUROSEMIDE 20 MG PO TABS: 20.0000 mg | ORAL_TABLET | Freq: Every day | ORAL | Status: AC | PRN

## 2022-03-18 NOTE — TOC Progression Note (Signed)
Transition of Care (TOC) - Progression Note  ? ? ?Patient Details  ?Name: [name] ?MRN: [id number] ?Date of Birth: 10/21/1956 ? ?Transition of Care (TOC) CM/SW Contact  ?Pollie Friar, RN ?Phone Number: ?03/18/2022, 1:47 PM ? ?Clinical Narrative:    ?Recommendations have changed to home health services. [facility] of [facility] have accepted the referral but need Dr Arelia Sneddon to sign off on the orders. Dr Arelia Sneddon has to see the patient in the office prior to sign off on [facility]. CM has gotten him an appointment for Friday. Info on the AVS. If Dr Arelia Sneddon doesn't approve [facility] patient can still attend outpatient rehab at [facility] on [address].  ?TOC following. ? ? ?Expected Discharge Plan: OP Rehab ?Barriers to Discharge: Continued Medical Work up ? ?Expected Discharge Plan and Services ?Expected Discharge Plan: OP Rehab ?  ?Discharge Planning Services: CM Consult ?  ?Living arrangements for the past 2 months: [place] ?                ?  ?  ?  ?  ?  ?HH Arranged: PT ?[place] Agency: [place] [facility] ?Date [place]: 03/18/22 ?  ?Representative spoke with at [place]: Sharyn Lull ? ? ?Social Determinants of Health (SDOH) Interventions ?  ? ?Readmission Risk Interventions ?   ? View : No data to display.  ?  ?  ?  ? ? ?

## 2022-03-18 NOTE — Plan of Care (Signed)
?  Problem: Education: ?Goal: Knowledge of General Education information will improve ?Description: Including pain rating scale, medication(s)/side effects and non-pharmacologic comfort measures ?Outcome: Progressing ?  ?Problem: Clinical Measurements: ?Goal: Will remain free from infection ?Outcome: Progressing ?Goal: Cardiovascular complication will be avoided ?Outcome: Progressing ?  ?Problem: Elimination: ?Goal: Will not experience complications related to urinary retention ?Outcome: Progressing ?  ?Problem: Pain Managment: ?Goal: General experience of comfort will improve ?Outcome: Progressing ?  ?Problem: Safety: ?Goal: Ability to remain free from injury will improve ?Outcome: Progressing ?  ?

## 2022-03-18 NOTE — Progress Notes (Signed)
STROKE TEAM PROGRESS NOTE  ? ?INTERVAL HISTORY ?His wife is at the bedside. Pt sitting in chair for lunch.  Wife shared there home BP log with me, patient baseline BP soft 90s to 110.  Home medication including Lasix and isosorbide.  Currently during hospitalization, no BP meds, patient BP still soft.  Orthostatic vital negative.  Patient and wife educated on BP monitoring, avoid low BP. ? ?Vitals:  ? 03/17/22 2323 03/18/22 0345 03/18/22 0805 03/18/22 1300  ?BP: 105/61 (!) 95/55 103/62 105/61  ?Pulse: 62 65 61 63  ?Resp: '17 17 18 18  '$ ?Temp: 98.3 ?F (36.8 ?C) 98.3 ?F (36.8 ?C) 98.6 ?F (37 ?C) 98.5 ?F (36.9 ?C)  ?TempSrc: Oral Oral Oral Oral  ?SpO2: 97% 95% 97% 97%  ?Weight:      ?Height:      ? ?CBC:  ?Recent Labs  ?Lab 03/16/22 ?1137 03/16/22 ?1147 03/16/22 ?1647  ?WBC 3.3*  --  3.0*  ?NEUTROABS 2.2  --   --   ?HGB 11.2* 10.9* 11.3*  ?HCT 34.8* 32.0* 34.7*  ?MCV 92.8  --  91.1  ?PLT PLATELET CLUMPS NOTED ON SMEAR, UNABLE TO ESTIMATE  --  121*  ? ?Basic Metabolic Panel:  ?Recent Labs  ?Lab 03/16/22 ?1137 03/16/22 ?1147 03/16/22 ?1647  ?NA 140 140  --   ?K 3.7 3.7  --   ?CL 105 106  --   ?CO2 25  --   --   ?GLUCOSE 120* 118*  --   ?BUN 15 17  --   ?CREATININE 1.09 1.00 1.07  ?CALCIUM 8.8*  --   --   ? ?Lipid Panel:  ?Recent Labs  ?Lab 03/17/22 ?0128  ?CHOL 60  ?TRIG 205*  ?HDL <10*  ?CHOLHDL NOT CALCULATED  ?VLDL 41*  ?LDLCALC NOT CALCULATED  ? ?HgbA1c:  ?Recent Labs  ?Lab 03/16/22 ?1647  ?HGBA1C 5.4  ? ?Urine Drug Screen:  ?Recent Labs  ?Lab 03/16/22 ?1043  ?LABOPIA NONE DETECTED  ?COCAINSCRNUR NONE DETECTED  ?LABBENZ NONE DETECTED  ?AMPHETMU NONE DETECTED  ?THCU NONE DETECTED  ?LABBARB NONE DETECTED  ?  ?Alcohol Level  ?Recent Labs  ?Lab 03/16/22 ?1137  ?ETH <10  ? ? ?IMAGING past 24 hours ?No results found. ? ?PHYSICAL EXAM ? ?Physical Exam  ?Constitutional: Appears well-developed and well-nourished.  ?Cardiovascular: Normal rate and regular rhythm.  ?Respiratory: Effort normal, non-labored  breathing ? ?Neuro: ?Mental Status: ?Patient is awake, alert, oriented to person, place, month, year, and situation. ?Patient is able to give a clear and coherent history. ?No signs of aphasia or neglect ?Cranial Nerves: ?II: Visual Fields are full. Pupils are equal, round, and reactive to light.   ?III,IV, VI: EOMI without ptosis or diploplia.  ?V: Facial sensation is symmetric to temperature ?VII: Facial movement is symmetric resting and smiling ?VIII: Hearing is intact to voice ?X: Palate elevates symmetrically ?XI: Shoulder shrug is symmetric. ?XII: Tongue protrudes midline without atrophy or fasciculations.  ?Motor: ?Tone is normal. Bulk is normal. 5/5 strength was present in all four extremities.  ?Sensory: ?Sensation is symmetric to light touch and temperature in the arms and legs. No extinction to DSS present.  ?Deep Tendon Reflexes: ?2+ and symmetric in the biceps and patellae.  ?Plantars: ?Toes are downgoing bilaterally.  ?Cerebellar: ?FNF and HKS are intact bilaterally ? ? ?ASSESSMENT/PLAN ?[name] is a 66 y.o. 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  ? ?Stroke:  bilateral multifocal infarcts, more at left MCA and PCA territory and some are in watershed area with one single infarct at right cerebellum, most likely hypoperfusion from dehydration and diarrhea and baseline soft BP in the setting of multifocal intracranial stenosis. No afib found on AICD. ?Code Stroke CT head No acute abnormality.  ?CTA head & neck Unchanged severe stenosis in the proximal left P2 segment with poor perfusion of the remainder of the left PCA. Left ICA 40% stenosis, right ICA siphon moderate stenosis, bilateral VA origin  atherosclerosis. ?MRI bilateral multifocal infarcts, more at left MCA and PCA territory and some are in watershed area with one single infarct at right cerebellum ?2D Echo EF 50 to 50% in 11/2021 ?AICD interrogation no A-fib or other cardiac arrhythmia ?EEG normal, no seizure ?LDL 22.1 ?HgbA1c 5.4 ?VTE prophylaxis - lovenox ?aspirin 81 mg daily and clopidogrel 75 mg daily prior to admission, now on aspirin 81 mg daily and clopidogrel 75 mg daily.  ?Therapy recommendations: None ?Disposition: Pending ? ?Hx stroke/TIA ?11/20/2021- scattered acute and subacute infarcts in bilateral frontal, parietal, temporal, and occipital lobes infarct.  CTA head and neck left P2 severe stenosis.  EF 50 to 50%.  DVT negative.  AICD interrogation no A-fib.  Discharged on DAPT and statin ? ?Diarrhea  ?Dehydration  ?orthostatic vitals no orthostatic hypotension ?Currently BP fluctuate, intermittent soft BP ?Baseline soft BP at home - taking isosorbide and Lasix ?Avoid low BP ?Hold off home BP meds for now. ? ?Hyperlipidemia ?Home meds:  Crestor '5mg'$ , zetia '10mg'$  ?LDL 22.1, goal < 70 ?Continue Crestor 5 and Zetia 10 ?Continue statin at discharge ? ?Other Stroke Risk Factors ?Advanced Age >/= 29  ?Obesity, Body mass index is 31.01 kg/m?., BMI >/= 30 associated with increased stroke risk, recommend weight loss, diet and exercise as appropriate  ?Coronary artery disease ?St. Jude pacemaker- sick sinus syndrome and bradycardia ?Home meds: Imdur ?Obstructive sleep apnea, on CPAP at home- previously wasn't using ? ?Other Active Problems ?Back Pain- per primary ?MRI spine showed diffuse disc bulge, no high grade spinal canal or neural foraminal stenosis , no evidence of nerve root impingement, detail please refer to original MRI spine ?Multiple Falls ?PT/OT evaluation ?COPD with mild copd exacerbation- manage per primary   ?Has bilateral wheezing, no acute infiltrate on CXR ?Quit smoking in 2018 ?Nebs, mucinex, steroids ?Normocytic anemia- per  primary ?Hgb 9.9 ?History of B12 deficiency- continue B12 ?Continue B12 ?BPH- per primary ?Proscar and flomax ? ?Hospital day # 0 ? ?Neurology will sign off. Please call with questions. Pt will follow up with stroke clinic NP at [facility] on 05/01/2022. Thanks for the consult. ? ? ?Rosalin Hawking, MD PhD ?Stroke Neurology ?03/18/2022 ?10:31 PM ? ? ?To contact Stroke Continuity provider, please refer to [email or web address] ?After hours, contact General Neurology ? ?

## 2022-03-18 NOTE — TOC Transition Note (Signed)
Transition of Care (TOC) - CM/SW Discharge Note ? ? ?Patient Details  ?Name: PRYCE FOLTS ?MRN: [id number] ?Date of Birth: 09/27/1956 ? ?Transition of Care (TOC) CM/SW Contact:  ?Pollie Friar, RN ?Phone Number: ?03/18/2022, 2:30 PM ? ? ?Clinical Narrative:    ?Patient is discharging home with home health services through [facility].  ?Patients wife able to provide transport home. ? ? ?Final next level of care: [place] ?Barriers to Discharge: No Barriers Identified ? ? ?Patient Goals and CMS Choice ?  ?CMS Medicare.gov Compare Post Acute Care list provided to:: Patient ?Choice offered to / list presented to : Patient, Spouse ? ?Discharge Placement ?  ?           ?  ?  ?  ?  ? ?Discharge Plan and Services ?  ?Discharge Planning Services: CM Consult ?           ?  ?  ?  ?  ?  ?HH Arranged: PT, RN ?[place] Agency: [place] [facility] ?Date [place]: 03/18/22 ?  ?Representative spoke with at [place]: Sharyn Lull ? ?Social Determinants of Health (SDOH) Interventions ?  ? ? ?Readmission Risk Interventions ?   ? View : No data to display.  ?  ?  ?  ? ? ? ? ? ?

## 2022-03-18 NOTE — Progress Notes (Signed)
?   03/18/22 1520  ?Clinical Encounter Type  ?Visited With Patient and family together  ?Visit Type Initial;Other (Comment) ?Solicitor)  ?Referral From Nurse  ?Consult/Referral To Chaplain  ? ?Chaplain responded to a spiritual consult for advanced directive education. ?Patient had the paperwork but had a few questions still. We went over the points he was not clear with and his intent is to fill out the paperwork at home.  ? ?Danice Goltz  ?Chaplain  ?[facility]  ?[phone] ?

## 2022-03-18 NOTE — Plan of Care (Signed)
  Problem: Education: Goal: Knowledge of General Education information will improve Description Including pain rating scale, medication(s)/side effects and non-pharmacologic comfort measures Outcome: Progressing   Problem: Health Behavior/Discharge Planning: Goal: Ability to manage health-related needs will improve Outcome: Progressing   

## 2022-03-18 NOTE — Discharge Summary (Signed)
Physician Discharge Summary  ?Austin Oliver:[id number] DOB: 1956/02/09 DOA: 03/16/2022 ? ?PCP: [name], MD ? ?Admit date: 03/16/2022 ?Discharge date: 03/18/2022 ? ?Time spent: 35 minutes ? ?Recommendations for Outpatient Follow-up:  ?[place] neurology in 1 month ?Urology Dr. Gloriann Loan in 7 to 10 days, discharged home with Foley catheter for recurrent urinary retention ? ?Discharge Diagnoses:  ?Recurrent CVA ?Hypotension ?Liver cirrhosis ?COPD ?Sick sinus syndrome ?Chronic diarrhea ?History of CVA ?  Coronary artery disease involving native coronary artery of native heart without angina pectoris ?  Unspecified cirrhosis of liver ([place]) ?  Cardiac pacemaker in situ ?  Chronic obstructive pulmonary disease (HCC) ?  Right sided weakness ? ? ?Discharge Condition: Stable ? ?Diet recommendation: Low-sodium, heart healthy ? ?Filed Weights  ? 03/16/22 1045  ?Weight: 95.3 kg  ? ? ?History of present illness:  ?66 y.o. male with medical history significant of CAD, COPD, sick sinus syndrome status post pacer, liver cirrhosis, history of cryptogenic embolic strokes in January 23 in the setting of COVID, which resulted in left leg weakness.  Patient has been improving with physical therapy. ?-For the last 3 weeks has been having some diarrhea, recently saw his PCP for this, was advised to stop metformin, plan for GI pathogen panel etc., took an antidiarrheal few days ago and diarrhea has slowed down since, ?-4/30 morning when he woke up he was weak all over, could not get out of bed, could not stand up, eventually when he was able to get up and start moving he was dragging his right leg and also felt weak and shaky.  He has some residual left-sided weakness following recent stroke in January. ?-EMS was called, after EMS arrived over time started regaining strength in his right leg. ? ?Hospital Course:  ? ?Recurrent CVA ?-Presented with new right leg weakness, now improved ?-Continue aspirin and Plavix ?-Has an MRI  conditional pacemaker, underwent MRI brain brain yesterday evening which showed bilateral acute strokes more in the watershed area per neurology, and not consistent with demyelinating process as noted in radiology report ?-This is suspected to be secondary to hypotension, continues to have soft BPs, on Imdur 120 Mg daily and Lasix 40 Mg daily with ongoing diarrhea, we suspect hypotension caused current strokes ?-Imdur discontinued, Lasix changed to 20 Mg as needed ?-LDL could not be calculated, A1c is 5.4 ?-Echo in 1/23 noted preserved EF, no significant valvular disease, CTA 1/23 was negative for large vessel occlusion ?-Per neurology recommended to continue aspirin and Plavix and follow-up with [facility] neurology in 1 month ?  ?Acute urinary retention ?-History of BPH, continue Flomax and Proscar, 4/30-5/1 developed acute urinary retention requiring Foley catheter placement ?-We attempted voiding trial yesterday and removed Foley, unfortunately developed recurrent retention again overnight requiring replacement of Foley catheter ?-He is being discharged home with a Foley catheter today ?-Followed by Dr. Gloriann Loan, urology, close follow-up recommended ?  ?History of CAD, multiple PCI and stents ?-No symptoms of ACS at this time, continue aspirin, Plavix, Imdur discontinued  ?-continue Ranexa and Crestor ?  ?Hypertension ?-BP soft but stable now, Imdur discontinued and Lasix changed to as needed ?  ?History of sick sinus syndrome ?-Status post pacer, has a St. Jude[employer] MR conditional pacemaker ?-Followed by EP, pacemaker eval without evidence of A-fib ?  ?Type 2 diabetes mellitus ?-Hold metformin on account of diarrhea, A1c is 5.4 ?  ?COPD ?-Former heavy smoker, stable, no wheezing, continue home nebs regimen ?  ?Liver cirrhosis ?-Etiology of this is  unclear, follow-up with gastroenterology, denies EtOH use ?  ?Recent right knee replacement ?  ?Subacute/chronic diarrhea ?-Ongoing for 3 weeks, advised by PCP to hold  metformin recently ?-No evidence of diarrhea during hospitalization ? ?Discharge Exam: ?Vitals:  ? 03/18/22 0805 03/18/22 1300  ?BP: 103/62 105/61  ?Pulse: 61 63  ?Resp: 18 18  ?Temp: 98.6 ?F (37 ?C) 98.5 ?F (36.9 ?C)  ?SpO2: 97% 97%  ?General exam: Appears calm and comfortable, AAOx3 ?Respiratory system: Clear to auscultation ?Cardiovascular system: S1 & S2 heard, RRR.  ?Abd: nondistended, soft and nontender.Normal bowel sounds heard. ?Central nervous system: Alert and oriented.  Mild residual left leg weakness ?GU: Foley catheter ?Extremities: no edema ?Skin: No rashes ?Psychiatry:  Mood & affect appropriate.  ? ?Discharge Instructions ? ? ?Discharge Instructions   ? ? Ambulatory referral to Physical Therapy   Complete by: As directed ?  ? Diet - low sodium heart healthy   Complete by: As directed ?  ? Diet Carb Modified   Complete by: As directed ?  ? Increase activity slowly   Complete by: As directed ?  ? ?  ? ?Allergies as of 03/18/2022   ? ?   Reactions  ? Bee Venom Swelling  ? Extreme Swelling of site    ? Cleocin [clindamycin Hcl] Rash  ? RASH IN BETWEEN FINGERS  ? Statins Other (See Comments)  ? Muscles aches with several statins  ? ?  ? ?  ?Medication List  ?  ? ?STOP taking these medications   ? ?ibuprofen 200 MG tablet ?Commonly known as: ADVIL ?  ?isosorbide mononitrate 120 MG 24 hr tablet ?Commonly known as: IMDUR ?  ?metFORMIN 500 MG tablet ?Commonly known as: GLUCOPHAGE ?  ? ?  ? ?TAKE these medications   ? ?acetaminophen 325 MG tablet ?Commonly known as: TYLENOL ?Take 1-2 tablets (325-650 mg total) by mouth every 6 (six) hours as needed for mild pain (pain score 1-3 or temp > 100.5). ?  ?albuterol 108 (90 Base) MCG/ACT inhaler ?Commonly known as: VENTOLIN HFA ?Inhale 2 puffs into the lungs every 6 (six) hours as needed for shortness of breath or wheezing. ?  ?aspirin EC 81 MG tablet ?Take 81 mg by mouth every evening. Swallow whole. ?  ?blood glucose meter kit and supplies Kit ?Dispense based on  patient and insurance preference. Use up to four times daily as directed. ?  ?cholecalciferol 25 MCG (1000 UNIT) tablet ?Commonly known as: VITAMIN D3 ?Take 1,000 Units by mouth daily. ?  ?clopidogrel 75 MG tablet ?Commonly known as: PLAVIX ?Take 1 tablet (75 mg total) by mouth daily. ?  ?ezetimibe 10 MG tablet ?Commonly known as: ZETIA ?Take 10 mg by mouth in the morning. ?  ?finasteride 5 MG tablet ?Commonly known as: PROSCAR ?Take 5 mg by mouth at bedtime. ?  ?furosemide 20 MG tablet ?Commonly known as: LASIX ?Take 1 tablet (20 mg total) by mouth daily as needed for fluid or edema. ?What changed:  ?when to take this ?reasons to take this ?  ?gabapentin 100 MG capsule ?Commonly known as: NEURONTIN ?Take 200 mg by mouth at bedtime. ?  ?levothyroxine 112 MCG tablet ?Commonly known as: SYNTHROID ?Take 112 mcg by mouth daily before breakfast. ?  ?loteprednol 0.5 % ophthalmic suspension ?Commonly known as: LOTEMAX ?Place 1 drop into the left eye daily as needed (if having eye pain from shingles). ?  ?nitroGLYCERIN 0.4 MG SL tablet ?Commonly known as: NITROSTAT ?Place 1 tablet (0.4 mg total) under  the tongue every 5 (five) minutes x 3 doses as needed for chest pain. ?  ?pantoprazole 40 MG tablet ?Commonly known as: PROTONIX ?Take 40 mg by mouth daily after supper. ?  ?psyllium 58.6 % powder ?Commonly known as: METAMUCIL ?Take 1 packet by mouth 3 (three) times daily as needed (constipation). ?  ?ranolazine 1000 MG SR tablet ?Commonly known as: RANEXA ?TAKE 1  BY MOUTH TWICE DAILY ?What changed: See the new instructions. ?  ?rosuvastatin 5 MG tablet ?Commonly known as: CRESTOR ?Take 1 tablet (5 mg total) by mouth daily. ?  ?shark liver oil-cocoa butter 0.25-3-85.5 % suppository ?Commonly known as: PREPARATION H ?Place 1 suppository rectally daily as needed for hemorrhoids. ?  ?Spiriva Respimat 2.5 MCG/ACT Aers ?Generic drug: Tiotropium Bromide Monohydrate ?Inhale 2 puffs into the lungs daily. ?  ?tamsulosin 0.4 MG Caps  capsule ?Commonly known as: FLOMAX ?Take 0.8 mg by mouth daily with supper. ?  ?vitamin B-12 1000 MCG tablet ?Commonly known as: CYANOCOBALAMIN ?Take 1,000 mcg by mouth in the morning. ?  ? ?  ? ?Allergies  ?Allergen Ulyess Mort

## 2022-03-18 NOTE — Progress Notes (Signed)
Discharge instructions, RX's and follow up appts explained and provided to patient and c/g verbalized understanding. Reviewed foley cath care with patient and c/g and provided printed instructions with discharge paperwork. Patient left floor via wheelchair accompanied by staff no c/o pain or shortness of breath at d/c. ? ?Alexine Pilant, Tivis Ringer, RN ? ?

## 2022-03-18 NOTE — Plan of Care (Signed)
?  Problem: Education: ?Goal: Knowledge of General Education information will improve ?Description: Including pain rating scale, medication(s)/side effects and non-pharmacologic comfort measures ?03/18/2022 1544 by Emmaline Life, RN ?Outcome: Adequate for Discharge ?03/18/2022 0909 by Emmaline Life, RN ?Outcome: Progressing ?  ?Problem: Health Behavior/Discharge Planning: ?Goal: Ability to manage health-related needs will improve ?03/18/2022 1544 by Emmaline Life, RN ?Outcome: Adequate for Discharge ?03/18/2022 0909 by Emmaline Life, RN ?Outcome: Progressing ?  ?Problem: Clinical Measurements: ?Goal: Ability to maintain clinical measurements within normal limits will improve ?Outcome: Adequate for Discharge ?Goal: Will remain free from infection ?Outcome: Adequate for Discharge ?Goal: Diagnostic test results will improve ?Outcome: Adequate for Discharge ?Goal: Respiratory complications will improve ?Outcome: Adequate for Discharge ?Goal: Cardiovascular complication will be avoided ?Outcome: Adequate for Discharge ?  ?Problem: Activity: ?Goal: Risk for activity intolerance will decrease ?Outcome: Adequate for Discharge ?  ?Problem: Nutrition: ?Goal: Adequate nutrition will be maintained ?Outcome: Adequate for Discharge ?  ?Problem: Coping: ?Goal: Level of anxiety will decrease ?Outcome: Adequate for Discharge ?  ?Problem: Elimination: ?Goal: Will not experience complications related to bowel motility ?Outcome: Adequate for Discharge ?Goal: Will not experience complications related to urinary retention ?Outcome: Adequate for Discharge ?  ?Problem: Coping: ?Goal: Level of anxiety will decrease ?Outcome: Adequate for Discharge ?  ?Problem: Pain Managment: ?Goal: General experience of comfort will improve ?Outcome: Adequate for Discharge ?  ?Problem: Safety: ?Goal: Ability to remain free from injury will improve ?Outcome: Adequate for Discharge ?  ?Problem: Skin Integrity: ?Goal: Risk for impaired skin integrity  will decrease ?Outcome: Adequate for Discharge ?  ?Problem: Education: ?Goal: Knowledge of disease or condition will improve ?Outcome: Adequate for Discharge ?Goal: Knowledge of secondary prevention will improve (SELECT ALL) ?Outcome: Adequate for Discharge ?  ?Problem: Coping: ?Goal: Will verbalize positive feelings about self ?Outcome: Adequate for Discharge ?Goal: Will identify appropriate support needs ?Outcome: Adequate for Discharge ?  ?Problem: Self-Care: ?Goal: Ability to participate in self-care as condition permits will improve ?Outcome: Adequate for Discharge ?  ?Problem: Nutrition: ?Goal: Risk of aspiration will decrease ?Outcome: Adequate for Discharge ?  ?Problem: Ischemic Stroke/TIA Tissue Perfusion: ?Goal: Complications of ischemic stroke/TIA will be minimized ?Outcome: Adequate for Discharge ?  ?Problem: Acute Rehab PT Goals(only PT should resolve) ?Goal: Patient Will Transfer Sit To/From Stand ?Outcome: Adequate for Discharge ?Goal: Pt Will Ambulate ?Outcome: Adequate for Discharge ?Goal: Pt Will Go Up/Down Stairs ?Outcome: Adequate for Discharge ?Goal: Pt/caregiver will Perform Home Exercise Program ?Outcome: Adequate for Discharge ?  ?Problem: Inadequate Intake (NI-2.1) ?Goal: Food and/or nutrient delivery ?Description: Individualized approach for food/nutrient provision. ?Outcome: Adequate for Discharge ?  ?

## 2022-03-18 NOTE — Progress Notes (Signed)
Physical Therapy Treatment ?Patient Details ?Name: Austin Oliver ?MRN: [id number] ?DOB: [birth year] ?Today's Date: 03/18/2022 ? ? ?History of Present Illness The pt is a 66 yo male presenting 4/30 with acute onset R-sided weakness that resolved by the time EMS arrived. 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 ? ?  ?PT Comments  ? ? Pt with regression from functional stand point requiring more assist today for transfers and demo'd decreased ambulation tolerance. Pt with difficulty clearing R foot today and was scuffing. Pt able to correct to cues but unable to maintain R foot clearance t/o ambulation. Pt stating "It feels like my R knee is going to give out.". Pt requiring modA for sit to stand transfers. Recommending HHPT as pt needs to be stronger prior to pt leaves the home for community navigation and returning to outpt neuro PT to decrease burden of care on spouse. Pt with known 3 falls since January. HHPT will be able to address balance deficits, weakness, and functional ability to safely transfer in/out of car and ambulate in/out of out pt clinic. Acute PT to cont to follow. ?   ?Recommendations for follow up therapy are one component of a multi-disciplinary discharge planning process, led by the attending physician.  Recommendations may be updated based on patient status, additional functional criteria and insurance authorization. ? ?Follow Up Recommendations ? Home health PT ?  ?  ?Assistance Recommended at Discharge Frequent or constant Supervision/Assistance  ?Patient can return home with the following A little help with walking and/or transfers;A little help with bathing/dressing/bathroom;Assistance with cooking/housework;Direct supervision/assist for medications management;Direct supervision/assist for financial management;Assist for transportation;Help with stairs or ramp for entrance ?   ?Equipment Recommendations ? None recommended by PT  ?  ?Recommendations for Other Services   ? ? ?  ?Precautions / Restrictions Precautions ?Precautions: Fall ?Precaution Comments: 3 falls since january ?Restrictions ?Weight Bearing Restrictions: No  ?  ? ?Mobility ? Bed Mobility ?Overal bed mobility: Modified Independent ?  ?  ?  ?  ?  ?  ?General bed mobility comments: increased time to sit up on R side of bed, mildly labored effort ?  ? ?Transfers ?Overall transfer level: Needs assistance ?Equipment used: Rolling walker (2 wheels) ?Transfers: Sit to/from Stand ?Sit to Stand: Mod assist ?  ?  ?  ?  ?  ?General transfer comment: pt attempted from EOB at lowest height, requiring modA to power up, bed elevated and pt with improved transfer however when in recliner pt required modA to power up despite having bilat arm rests to push up from, pt with noted bilat LE weakness ?  ? ?Ambulation/Gait ?Ambulation/Gait assistance: Min assist ?Gait Distance (Feet): 60 Feet (x2) ?Assistive device: Rolling walker (2 wheels) ?Gait Pattern/deviations: Step-through pattern, Decreased stride length, Decreased dorsiflexion - right, Shuffle, Trunk flexed ?Gait velocity: dec ?Gait velocity interpretation: <1.8 ft/sec, indicate of risk for recurrent falls ?  ?General Gait Details: pt with noted difficulty today with clearing R foot consistently requiring verbal cues to pick up R foot t/o session. Pt with report "It just feels like my R knee is going to give out on me" requiring pt to sit and rest. Pt with noted decreased activity tolerance and stability today ? ? ?Stairs ?  ?  ?  ?  ?  ? ? ?Wheelchair Mobility ?  ? ?Modified Rankin (Stroke Patients Only) ?Modified Rankin (Stroke Patients  Only) ?Pre-Morbid Rankin Score: Moderate disability ?Modified Rankin: Moderately severe disability ? ? ?  ?Balance Overall balance assessment: History of Falls, Needs assistance ?Sitting-balance support: No upper extremity supported, Feet  supported ?Sitting balance-Leahy Scale: Good ?Sitting balance - Comments: can reach towards feet without LOB ?  ?Standing balance support: Bilateral upper extremity supported, During functional activity ?Standing balance-Leahy Scale: Poor ?Standing balance comment: dependent on RW ?  ?  ?  ?  ?  ?  ?  ?  ?  ?  ?  ?  ? ?  ?Cognition Arousal/Alertness: Awake/alert ?Behavior During Therapy: [facility] for tasks assessed/performed ?Overall Cognitive Status: Impaired/Different from baseline ?Area of Impairment: Memory, Following commands ?  ?  ?  ?  ?  ?  ?  ?  ?  ?  ?Memory: Decreased short-term memory ?Following Commands: Follows one step commands with increased time, Follows one step commands consistently ?  ?  ?Problem Solving: Requires verbal cues ?General Comments: pt very HOH which could be the cause of the delayed response/command following. Pt aware he is weaker today compared to yesterday ?  ?  ? ?  ?Exercises   ? ?  ?General Comments General comments (skin integrity, edema, etc.): VSS ?  ?  ? ?Pertinent Vitals/Pain Pain Assessment ?Pain Assessment: 0-10 ?Pain Score: 2  ?Pain Location: R knee with mobility ?Pain Descriptors / Indicators: Discomfort, Grimacing ?Pain Intervention(s): Monitored during session  ? ? ?Home Living   ?  ?  ?  ?  ?  ?  ?  ?  ?  ?   ?  ?Prior Function    ?  ?  ?   ? ?PT Goals (current goals can now be found in the care plan section) Acute Rehab PT Goals ?Patient Stated Goal: get stronger ?PT Goal Formulation: With patient ?Time For Goal Achievement: 03/30/22 ?Potential to Achieve Goals: Good ?Progress towards PT goals: Not progressing toward goals - comment (had some regression from functional stand point today) ? ?  ?Frequency ? ? ? Min 3X/week ? ? ? ?  ?PT Plan Current plan remains appropriate  ? ? ?Co-evaluation   ?  ?  ?  ?  ? ?  ?AM-PAC PT "6 Clicks" Mobility   ?Outcome Measure ? Help needed turning from your back to your side while in a flat bed without using bedrails?: A Little ?Help  needed moving from lying on your back to sitting on the side of a flat bed without using bedrails?: A Little ?Help needed moving to and from a bed to a chair (including a wheelchair)?: A Little ?Help needed standing up from a chair using your arms (e.g., wheelchair or bedside chair)?: A Lot ?Help needed to walk in hospital room?: A Little ?Help needed climbing 3-5 steps with a railing? : A Lot (anticipate due to R knee pain) ?6 Click Score: 16 ? ?  ?End of Session Equipment Utilized During Treatment: Gait belt ?Activity Tolerance: Patient limited by fatigue ?Patient left: with call bell/phone within reach;with family/visitor present;in chair;with chair alarm set (spouse present) ?Nurse Communication: Mobility status ?PT Visit Diagnosis: Other abnormalities of gait and mobility (R26.89);History of falling (Z91.81);Muscle weakness (generalized) (M62.81) ?  ? ? ?Time: 1194-1740 ?PT Time Calculation (min) (ACUTE ONLY): 36 min ? ?Charges:  $Gait Training: 8-22 mins ?$Therapeutic Activity: 8-22 mins          ?          ? ?Kittie Plater, PT, DPT ?Acute Rehabilitation Services ?Secure  chat preferred ?Office #: [phone] ? ? ? ?Yaneliz Radebaugh M Genecis Veley ?03/18/2022, 1:56 PM ? ?

## 2022-03-19 ENCOUNTER — Ambulatory Visit: Payer: Medicare Other | Admitting: Registered"

## 2022-03-21 NOTE — Progress Notes (Signed)
Remote pacemaker transmission.   

## 2022-03-25 ENCOUNTER — Other Ambulatory Visit: Payer: Self-pay

## 2022-03-25 ENCOUNTER — Encounter (HOSPITAL_COMMUNITY): Payer: Self-pay | Admitting: Family Medicine

## 2022-03-25 ENCOUNTER — Emergency Department (HOSPITAL_COMMUNITY): Payer: Medicare Other

## 2022-03-25 ENCOUNTER — Observation Stay (HOSPITAL_COMMUNITY): Payer: Medicare Other

## 2022-03-25 ENCOUNTER — Emergency Department (HOSPITAL_BASED_OUTPATIENT_CLINIC_OR_DEPARTMENT_OTHER): Payer: Medicare Other

## 2022-03-25 ENCOUNTER — Inpatient Hospital Stay (HOSPITAL_COMMUNITY)
Admission: EM | Admit: 2022-03-25 | Discharge: 2022-04-04 | DRG: 092 | Disposition: A | Discharge status: Rehabilitation Facility | Payer: Medicare Other | Attending: Internal Medicine | Admitting: Internal Medicine

## 2022-03-25 DIAGNOSIS — Z888 Allergy status to other drugs, medicaments and biological substances status: Secondary | ICD-10-CM

## 2022-03-25 DIAGNOSIS — M879 Osteonecrosis, unspecified: Secondary | ICD-10-CM | POA: Diagnosis present

## 2022-03-25 DIAGNOSIS — R590 Localized enlarged lymph nodes: Secondary | ICD-10-CM | POA: Diagnosis present

## 2022-03-25 DIAGNOSIS — W182XXA Fall in (into) shower or empty bathtub, initial encounter: Secondary | ICD-10-CM | POA: Diagnosis present

## 2022-03-25 DIAGNOSIS — Z96642 Presence of left artificial hip joint: Secondary | ICD-10-CM | POA: Diagnosis present

## 2022-03-25 DIAGNOSIS — J449 Chronic obstructive pulmonary disease, unspecified: Secondary | ICD-10-CM | POA: Diagnosis present

## 2022-03-25 DIAGNOSIS — K219 Gastro-esophageal reflux disease without esophagitis: Secondary | ICD-10-CM | POA: Diagnosis present

## 2022-03-25 DIAGNOSIS — Z79890 Hormone replacement therapy (postmenopausal): Secondary | ICD-10-CM

## 2022-03-25 DIAGNOSIS — C801 Malignant (primary) neoplasm, unspecified: Secondary | ICD-10-CM | POA: Diagnosis present

## 2022-03-25 DIAGNOSIS — Y93E1 Activity, personal bathing and showering: Secondary | ICD-10-CM

## 2022-03-25 DIAGNOSIS — G9511 Acute infarction of spinal cord (embolic) (nonembolic): Secondary | ICD-10-CM | POA: Diagnosis not present

## 2022-03-25 DIAGNOSIS — Z981 Arthrodesis status: Secondary | ICD-10-CM

## 2022-03-25 DIAGNOSIS — R339 Retention of urine, unspecified: Secondary | ICD-10-CM | POA: Diagnosis present

## 2022-03-25 DIAGNOSIS — R531 Weakness: Secondary | ICD-10-CM | POA: Diagnosis not present

## 2022-03-25 DIAGNOSIS — I2510 Atherosclerotic heart disease of native coronary artery without angina pectoris: Secondary | ICD-10-CM | POA: Diagnosis present

## 2022-03-25 DIAGNOSIS — I10 Essential (primary) hypertension: Secondary | ICD-10-CM | POA: Diagnosis present

## 2022-03-25 DIAGNOSIS — Z87891 Personal history of nicotine dependence: Secondary | ICD-10-CM

## 2022-03-25 DIAGNOSIS — Z955 Presence of coronary angioplasty implant and graft: Secondary | ICD-10-CM

## 2022-03-25 DIAGNOSIS — I495 Sick sinus syndrome: Secondary | ICD-10-CM | POA: Diagnosis present

## 2022-03-25 DIAGNOSIS — M25561 Pain in right knee: Secondary | ICD-10-CM | POA: Diagnosis present

## 2022-03-25 DIAGNOSIS — Z8619 Personal history of other infectious and parasitic diseases: Secondary | ICD-10-CM

## 2022-03-25 DIAGNOSIS — H53461 Homonymous bilateral field defects, right side: Secondary | ICD-10-CM | POA: Diagnosis present

## 2022-03-25 DIAGNOSIS — Z9181 History of falling: Secondary | ICD-10-CM

## 2022-03-25 DIAGNOSIS — Z6831 Body mass index (BMI) 31.0-31.9, adult: Secondary | ICD-10-CM

## 2022-03-25 DIAGNOSIS — D6859 Other primary thrombophilia: Secondary | ICD-10-CM | POA: Diagnosis present

## 2022-03-25 DIAGNOSIS — K7460 Unspecified cirrhosis of liver: Secondary | ICD-10-CM | POA: Diagnosis present

## 2022-03-25 DIAGNOSIS — Z79899 Other long term (current) drug therapy: Secondary | ICD-10-CM

## 2022-03-25 DIAGNOSIS — R2990 Unspecified symptoms and signs involving the nervous system: Secondary | ICD-10-CM | POA: Diagnosis present

## 2022-03-25 DIAGNOSIS — Z7982 Long term (current) use of aspirin: Secondary | ICD-10-CM

## 2022-03-25 DIAGNOSIS — Z96659 Presence of unspecified artificial knee joint: Secondary | ICD-10-CM

## 2022-03-25 DIAGNOSIS — G379 Demyelinating disease of central nervous system, unspecified: Secondary | ICD-10-CM | POA: Diagnosis present

## 2022-03-25 DIAGNOSIS — R9389 Abnormal findings on diagnostic imaging of other specified body structures: Secondary | ICD-10-CM

## 2022-03-25 DIAGNOSIS — R161 Splenomegaly, not elsewhere classified: Secondary | ICD-10-CM | POA: Diagnosis present

## 2022-03-25 DIAGNOSIS — R198 Other specified symptoms and signs involving the digestive system and abdomen: Secondary | ICD-10-CM

## 2022-03-25 DIAGNOSIS — E039 Hypothyroidism, unspecified: Secondary | ICD-10-CM | POA: Diagnosis present

## 2022-03-25 DIAGNOSIS — R160 Hepatomegaly, not elsewhere classified: Secondary | ICD-10-CM | POA: Diagnosis present

## 2022-03-25 DIAGNOSIS — R296 Repeated falls: Secondary | ICD-10-CM

## 2022-03-25 DIAGNOSIS — E785 Hyperlipidemia, unspecified: Secondary | ICD-10-CM | POA: Diagnosis present

## 2022-03-25 DIAGNOSIS — R292 Abnormal reflex: Secondary | ICD-10-CM | POA: Diagnosis present

## 2022-03-25 DIAGNOSIS — Z881 Allergy status to other antibiotic agents status: Secondary | ICD-10-CM

## 2022-03-25 DIAGNOSIS — C7971 Secondary malignant neoplasm of right adrenal gland: Secondary | ICD-10-CM | POA: Diagnosis present

## 2022-03-25 DIAGNOSIS — Z96651 Presence of right artificial knee joint: Secondary | ICD-10-CM | POA: Diagnosis present

## 2022-03-25 DIAGNOSIS — E119 Type 2 diabetes mellitus without complications: Secondary | ICD-10-CM

## 2022-03-25 DIAGNOSIS — G4733 Obstructive sleep apnea (adult) (pediatric): Secondary | ICD-10-CM | POA: Diagnosis present

## 2022-03-25 DIAGNOSIS — Z950 Presence of cardiac pacemaker: Secondary | ICD-10-CM

## 2022-03-25 DIAGNOSIS — Z8673 Personal history of transient ischemic attack (TIA), and cerebral infarction without residual deficits: Secondary | ICD-10-CM

## 2022-03-25 DIAGNOSIS — Z8616 Personal history of COVID-19: Secondary | ICD-10-CM

## 2022-03-25 DIAGNOSIS — R338 Other retention of urine: Secondary | ICD-10-CM | POA: Diagnosis present

## 2022-03-25 DIAGNOSIS — C7972 Secondary malignant neoplasm of left adrenal gland: Secondary | ICD-10-CM | POA: Diagnosis present

## 2022-03-25 DIAGNOSIS — Z825 Family history of asthma and other chronic lower respiratory diseases: Secondary | ICD-10-CM

## 2022-03-25 DIAGNOSIS — M87051 Idiopathic aseptic necrosis of right femur: Secondary | ICD-10-CM | POA: Diagnosis present

## 2022-03-25 DIAGNOSIS — Z91030 Bee allergy status: Secondary | ICD-10-CM

## 2022-03-25 DIAGNOSIS — M79604 Pain in right leg: Secondary | ICD-10-CM | POA: Diagnosis not present

## 2022-03-25 DIAGNOSIS — M1990 Unspecified osteoarthritis, unspecified site: Secondary | ICD-10-CM | POA: Diagnosis present

## 2022-03-25 DIAGNOSIS — N401 Enlarged prostate with lower urinary tract symptoms: Secondary | ICD-10-CM | POA: Diagnosis present

## 2022-03-25 DIAGNOSIS — Z87442 Personal history of urinary calculi: Secondary | ICD-10-CM

## 2022-03-25 DIAGNOSIS — C7889 Secondary malignant neoplasm of other digestive organs: Secondary | ICD-10-CM | POA: Diagnosis present

## 2022-03-25 DIAGNOSIS — D352 Benign neoplasm of pituitary gland: Secondary | ICD-10-CM | POA: Diagnosis present

## 2022-03-25 DIAGNOSIS — Z20822 Contact with and (suspected) exposure to covid-19: Secondary | ICD-10-CM | POA: Diagnosis present

## 2022-03-25 DIAGNOSIS — C787 Secondary malignant neoplasm of liver and intrahepatic bile duct: Secondary | ICD-10-CM | POA: Diagnosis present

## 2022-03-25 DIAGNOSIS — Z7902 Long term (current) use of antithrombotics/antiplatelets: Secondary | ICD-10-CM

## 2022-03-25 DIAGNOSIS — D61818 Other pancytopenia: Secondary | ICD-10-CM | POA: Diagnosis present

## 2022-03-25 DIAGNOSIS — I714 Abdominal aortic aneurysm, without rupture, unspecified: Secondary | ICD-10-CM | POA: Diagnosis present

## 2022-03-25 DIAGNOSIS — C7951 Secondary malignant neoplasm of bone: Secondary | ICD-10-CM | POA: Diagnosis present

## 2022-03-25 DIAGNOSIS — E669 Obesity, unspecified: Secondary | ICD-10-CM | POA: Diagnosis present

## 2022-03-25 DIAGNOSIS — I69341 Monoplegia of lower limb following cerebral infarction affecting right dominant side: Secondary | ICD-10-CM

## 2022-03-25 LAB — CBC
HCT: 35.9 % — ABNORMAL LOW (ref 39.0–52.0)
Hemoglobin: 11.5 g/dL — ABNORMAL LOW (ref 13.0–17.0)
MCH: 29.5 pg (ref 26.0–34.0)
MCHC: 32 g/dL (ref 30.0–36.0)
MCV: 92.1 fL (ref 80.0–100.0)
Platelets: 121 10*3/uL — ABNORMAL LOW (ref 150–400)
RBC: 3.9 MIL/uL — ABNORMAL LOW (ref 4.22–5.81)
RDW: 14.4 % (ref 11.5–15.5)
WBC: 3.3 10*3/uL — ABNORMAL LOW (ref 4.0–10.5)
nRBC: 0 % (ref 0.0–0.2)

## 2022-03-25 LAB — URINALYSIS, ROUTINE W REFLEX MICROSCOPIC
Bilirubin Urine: NEGATIVE
Bilirubin Urine: NEGATIVE
Glucose, UA: NEGATIVE mg/dL
Glucose, UA: NEGATIVE mg/dL
Ketones, ur: NEGATIVE mg/dL
Ketones, ur: NEGATIVE mg/dL
Leukocytes,Ua: NEGATIVE
Nitrite: NEGATIVE
Nitrite: NEGATIVE
Protein, ur: NEGATIVE mg/dL
Protein, ur: NEGATIVE mg/dL
Specific Gravity, Urine: 1.02 (ref 1.005–1.030)
Specific Gravity, Urine: 1.004 — ABNORMAL LOW (ref 1.005–1.030)
pH: 5.5 (ref 5.0–8.0)
pH: 5 (ref 5.0–8.0)

## 2022-03-25 LAB — COMPREHENSIVE METABOLIC PANEL
ALT: 20 U/L (ref 0–44)
AST: 23 U/L (ref 15–41)
Albumin: 3.5 g/dL (ref 3.5–5.0)
Alkaline Phosphatase: 71 U/L (ref 38–126)
Anion gap: 11 (ref 5–15)
BUN: 22 mg/dL (ref 8–23)
CO2: 22 mmol/L (ref 22–32)
Calcium: 9 mg/dL (ref 8.9–10.3)
Chloride: 104 mmol/L (ref 98–111)
Creatinine, Ser: 1.1 mg/dL (ref 0.61–1.24)
GFR, Estimated: >60 mL/min (ref >60)
Glucose, Bld: 86 mg/dL (ref 70–99)
Potassium: 4.2 mmol/L (ref 3.5–5.1)
Sodium: 137 mmol/L (ref 135–145)
Total Bilirubin: 1.6 mg/dL — ABNORMAL HIGH (ref 0.3–1.2)
Total Protein: 7.1 g/dL (ref 6.5–8.1)

## 2022-03-25 LAB — URINALYSIS, MICROSCOPIC (REFLEX)
Squamous Epithelial / HPF: NONE SEEN (ref 0–5)
WBC, UA: NONE SEEN WBC/hpf (ref 0–5)

## 2022-03-25 LAB — RAPID URINE DRUG SCREEN, HOSP PERFORMED
Amphetamines: NOT DETECTED
Barbiturates: NOT DETECTED
Benzodiazepines: NOT DETECTED
Cocaine: NOT DETECTED
Opiates: NOT DETECTED
Tetrahydrocannabinol: NOT DETECTED

## 2022-03-25 LAB — PROTIME-INR
INR: 1.1 (ref 0.8–1.2)
Prothrombin Time: 13.6 seconds (ref 11.4–15.2)

## 2022-03-25 LAB — I-STAT CHEM 8, ED
BUN: 25 mg/dL — ABNORMAL HIGH (ref 8–23)
Calcium, Ion: 1.02 mmol/L — ABNORMAL LOW (ref 1.15–1.40)
Chloride: 105 mmol/L (ref 98–111)
Creatinine, Ser: 1 mg/dL (ref 0.61–1.24)
Glucose, Bld: 86 mg/dL (ref 70–99)
HCT: 37 % — ABNORMAL LOW (ref 39.0–52.0)
Hemoglobin: 12.6 g/dL — ABNORMAL LOW (ref 13.0–17.0)
Potassium: 4.2 mmol/L (ref 3.5–5.1)
Sodium: 136 mmol/L (ref 135–145)
TCO2: 23 mmol/L (ref 22–32)

## 2022-03-25 LAB — DIFFERENTIAL
Abs Immature Granulocytes: 0.03 10*3/uL (ref 0.00–0.07)
Basophils Absolute: 0 10*3/uL (ref 0.0–0.1)
Basophils Relative: 1 %
Eosinophils Absolute: 0.1 10*3/uL (ref 0.0–0.5)
Eosinophils Relative: 2 %
Immature Granulocytes: 1 %
Lymphocytes Relative: 28 %
Lymphs Abs: 0.9 10*3/uL (ref 0.7–4.0)
Monocytes Absolute: 0.6 10*3/uL (ref 0.1–1.0)
Monocytes Relative: 17 %
Neutro Abs: 1.7 10*3/uL (ref 1.7–7.7)
Neutrophils Relative %: 51 %

## 2022-03-25 LAB — RESP PANEL BY RT-PCR (FLU A&B, COVID) ARPGX2
Influenza A by PCR: NEGATIVE
Influenza B by PCR: NEGATIVE
SARS Coronavirus 2 by RT PCR: NEGATIVE

## 2022-03-25 LAB — CREATININE, SERUM
Creatinine, Ser: 1.24 mg/dL (ref 0.61–1.24)
GFR, Estimated: >60 mL/min (ref >60)

## 2022-03-25 LAB — APTT: aPTT: 28 seconds (ref 24–36)

## 2022-03-25 LAB — ETHANOL: Alcohol, Ethyl (B): <10 mg/dL (ref <10)

## 2022-03-25 MED ORDER — SENNOSIDES-DOCUSATE SODIUM 8.6-50 MG PO TABS: 1.0000 | ORAL_TABLET | Freq: Every evening | ORAL | Status: DC | PRN

## 2022-03-25 MED ORDER — ACETAMINOPHEN 650 MG RE SUPP: 650.0000 mg | RECTAL | Status: DC | PRN

## 2022-03-25 MED ORDER — ACETAMINOPHEN 160 MG/5ML PO SOLN: 650.0000 mg | ORAL | Status: DC | PRN

## 2022-03-25 MED ORDER — ENOXAPARIN SODIUM 40 MG/0.4ML IJ SOSY
40.0000 mg | PREFILLED_SYRINGE | INTRAMUSCULAR | Status: DC
  Administered 2022-03-25 – 2022-04-02 (×9): 40 mg via SUBCUTANEOUS
  Filled 2022-03-25 (×11): qty 0.4

## 2022-03-25 MED ORDER — ACETAMINOPHEN 325 MG PO TABS
650.0000 mg | ORAL_TABLET | ORAL | Status: DC | PRN
  Administered 2022-03-26 – 2022-04-03 (×14): 650 mg via ORAL
  Filled 2022-03-25 (×15): qty 2

## 2022-03-25 MED ORDER — STROKE: EARLY STAGES OF RECOVERY BOOK
Freq: Once | Status: AC
  Filled 2022-03-25: qty 1

## 2022-03-25 NOTE — Assessment & Plan Note (Deleted)
Noted on prior abdominal ultrasound. ?

## 2022-03-25 NOTE — Consult Note (Signed)
NEUROLOGY CONSULTATION NOTE  ? ?Date of service: Mar 25, 2022 ?Patient Name: Austin Oliver ?MRN:  [id number] ?DOB:  09/16/1956 ?Reason for consult: "difficulty ambulating and RLE weakness" ?Requesting Provider: Elodia Florence., * ?_ _ _   _ __   _ __ _ _  __ __   _ __   __ _ ? ?History of Present Illness  ?Austin Oliver is a 66 y.o. 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 Of note, he had urinary retention at the time of hospitalization in early may. ? ?He was discharged home and since then has had recurrent falls along with RLE weakness and swelling. Reports that he was doing well with PT on Monday 5/1. Tuesday 5/2, he felt he was weaker when he worked with PT. He reports that he has since gradually worsened and both his legs feel worse with R worse than left. Around 5/4, he noticed that he was constipated, has worsened and has not had a bowel movement in the last few days. Cant tell if he is going to have a bowel movement until it already has started. Also was discharged from the hospital with a foley's cath. Reports that he cant tell when his bladder is full, just hurts him when it gets full. He has been unable to urinate without the foley cath. ?  ?ROS  ? ?Constitutional Denies weight loss, fever and chills.   ?HEENT Denies changes in vision and hearing.   ?Respiratory Denies SOB and cough.   ?CV Denies palpitations and CP   ?GI Denies  abdominal pain, nausea, vomiting and diarrhea.   ?GU + dysuria and urinary retention.  ?MSK + myalgia and joint pain.  ?Skin Denies rash and pruritus.  ?Neurological Denies headache and syncope.  ?Psychiatric Denies recent changes in mood. Denies anxiety and depression.   ? ?Past History  ? ?Past Medical History:  ?Diagnosis Date  ? Cirrhosis of liver ([place])   ? COPD (chronic obstructive pulmonary disease) ([place])   ? Coronary artery calcification seen on CAT scan 01/14/2017  ? Essential hypertension 09/11/2020  ? GERD (gastroesophageal reflux disease)   ? History of kidney stones   ? Hyperlipidemia   ? Hypothyroidism   ? OSA on CPAP   ? Mild with AHI 7.8/hr >>on CPAP  ? Osteoarthritis   ? Presence of permanent cardiac pacemaker   ? Shingles 2012  ? ?Past Surgical History:  ?Procedure Laterality Date  ? CORONARY STENT INTERVENTION N/A 08/24/2020  ? Procedure: CORONARY STENT INTERVENTION;  Surgeon: [name], MD;  Location: [place] [facility];  Service: Cardiovascular;  Laterality: N/A;  ? HEEL SPUR SURGERY Left 80's  ? INTRAVASCULAR PRESSURE WIRE/FFR STUDY N/A 04/25/2021  ? Procedure: INTRAVASCULAR PRESSURE WIRE/FFR STUDY;  Surgeon: [name], MD;  Location: [place] [facility];  Service: Cardiovascular;  Laterality: N/A;  ? INTRAVASCULAR ULTRASOUND/IVUS N/A 08/24/2020  ? Procedure: Intravascular Ultrasound/IVUS;  Surgeon: [name], MD;  Location: [place] [facility];  Service: Cardiovascular;  Laterality:  N/A;  ? KNEE ARTHROPLASTY Right 10/31/2021  ? Procedure: COMPUTER ASSISTED TOTAL KNEE ARTHROPLASTY;  Surgeon: Rod Can, MD;  Location: [facility];  Service: Orthopedics;  Laterality: Right;  ? KNEE SURGERY Bilateral   ? numerous times  ? LEFT HEART CATH AND CORONARY ANGIOGRAPHY N/A 08/24/2020  ? Procedure: LEFT HEART CATH AND CORONARY ANGIOGRAPHY;  Surgeon: [name], MD;  Location: [place] [facility];  Service: Cardiovascular;  Laterality: N/A;  ? LEFT HEART CATH AND CORONARY ANGIOGRAPHY N/A  04/25/2021  ? Procedure: LEFT HEART CATH AND CORONARY ANGIOGRAPHY;  Surgeon: [name], MD;  Location: [facility] [facility];  Service: Cardiovascular;  Laterality: N/A;  ? NECK SURGERY  70's  ? PACEMAKER IMPLANT N/A 12/04/2020  ? Procedure: PACEMAKER IMPLANT;  Surgeon: [name], MD;  Location: [place] [facility];  Service: Cardiovascular;  Laterality: N/A;  ? TOTAL HIP ARTHROPLASTY Left 03/09/2017  ? Procedure: LEFT TOTAL HIP ARTHROPLASTY ANTERIOR APPROACH;  Surgeon: Rod Can, MD;  Location: [facility];  Service: Orthopedics;  Laterality: Left;  Dr. requesting RNFA  ? ?Family History  ?Problem Relation Age of Onset  ? Dementia Mother   ? Heart Problems Father   ? COPD Father   ? Lung cancer Father   ? ?Social History  ? ?Socioeconomic History  ? Marital status: Married  ?  Spouse name: [name]  ? Number of children: 2  ? Years of education: Not on file  ? Highest education level: Not on file  ?Occupational History  ? Occupation: Animator  ?Tobacco Use  ? Smoking status: Former  ?  Packs/day: 2.00  ?  Years: 45.00  ?  Pack years: 90.00  ?  Types: Cigarettes  ?  Quit date: 12/05/2016  ?  Years since quitting: 5.3  ? Smokeless tobacco: Never  ?Vaping Use  ? Vaping Use: Never used  ?Substance and Sexual Activity  ? Alcohol use: No  ? Drug use: No  ? Sexual activity: Not on file  ?Other Topics Concern  ? Not on file  ?Social History Narrative  ? Not on file  ? ?Social Determinants of Health  ? ?Financial Resource Strain: Not on file  ?Food Insecurity: Not on file  ?Transportation Needs: Not on file  ?Physical Activity: Not on file  ?Stress: Not on file  ?Social Connections: Not on file  ? ?Allergies  ?Allergen Reactions  ? Bee Venom Swelling  ?  Extreme Swelling of site    ? Cleocin [Clindamycin Hcl] Rash  ?  RASH IN BETWEEN FINGERS  ? Statins Other (See Comments)  ?  Muscles aches with several statins  ? ? ?Medications  ?(Not in a hospital admission) ?  ? ?Vitals  ? ?Vitals:  ? 03/25/22 1550  03/25/22 1630 03/25/22 1804 03/25/22 1805  ?BP: 121/79 114/71    ?Pulse: 63 62    ?Resp:  16    ?Temp:      ?TempSrc:      ?SpO2: 99% 97%  97%  ?Weight:   95.3 kg   ?Height:   '5\' 9"'$  (1.753 m)   ?  ? ?Body mass index is 31.01 kg/m?. ? ?Physical Exam  ? ?General: Laying comfortably in bed; in no acute distress.  ?HENT: Normal oropharynx and mucosa. Normal external appearance of ears and nose.  ?Neck: Supple, no pain or tenderness  ?CV: No JVD. No peripheral edema.  ?Pulmonary: Symmetric Chest rise. Normal respiratory effort.  ?Abdomen: Soft to touch, non-tender.  ?Ext: No cyanosis, edema,  or deformity  ?Skin: No rash. Normal palpation of skin.   ?Musculoskeletal: Normal digits and nails by inspection. No clubbing.  ? ?Neurologic Examination  ?Mental status/Cognition: Alert, oriented to self, place, month and year, good attention.  ?Speech/language: Fluent, comprehension intact, object naming intact, repetition intact.  ?Cranial nerves:  ? CN II R pupil larger than L, left is sluggish respone to light, no VF deficits   ? CN III,IV,VI EOM intact, no gaze preference or deviation, no nystagmus   ? CN V normal sensation in V1, V2, and V3 segments bilaterally   ? CN VII no asymmetry, no nasolabial fold flattening   ? CN VIII normal hearing to speech   ? CN IX & X normal palatal elevation, no uvular deviation   ? CN XI 5/5 head turn and 5/5 shoulder shrug bilaterally   ? CN XII midline tongue protrusion   ? ?Motor:  ?Muscle bulk: normal, tone normal, pronator drift none tremor none ?Mvmt Root Nerve  Muscle Right Left Comments  ?SA C5/6 Ax Deltoid 5 5   ?EF C5/6 Mc Biceps 5 5   ?EE C6/7/8 Rad Triceps 5 5   ?WF C6/7 Med FCR     ?WE C7/8 PIN ECU     ?F Ab C8/T1 U ADM/FDI 5 5   ?HF L1/2/3 Fem Illopsoas 1 4   ?KE L2/3/4 Fem Quad 1 4   ?DF L4/5 D Peron Tib Ant 1 4   ?PF S1/2 Tibial Grc/Sol 1 4   ? ?Reflexes: ? Right Left Comments  ?Pectoralis     ? Biceps (C5/6) 2 2   ?Brachioradialis (C5/6) 2 2   ? Triceps (C6/7) 2 2   ? Patellar  (L3/4) 3 3 Cross adductors +  ? Achilles (S1) 3-4 beats of clonus 3   ? Hoffman     ? Plantar withdraws withdraws   ?Jaw jerk   ? ?Sensation: ? Light touch Intact throughout  ? Pin prick Intact throughout

## 2022-03-25 NOTE — ED Notes (Signed)
Pt was discharged last Wednesday. That evening he had a fall in the shower but was able to get himself back up and was walking normally. After the fall he had increased weakness. Saturday night at 0130 pt had an assisted fall when trying to get tylenol. Ever since then he has not been able to move his right leg and has developed right lean. Pt NIH is 2 at this time for right sided weakness and slight dysarthria. Pt did have knee surgery in December/January of this past year and reports coldness in his right leg below the knee. He is able to move all toes  ?

## 2022-03-25 NOTE — Assessment & Plan Note (Addendum)
Patient is on Aspirin, Plavix, Ranexa, Crestor as an outpatient. No current chest pain.  ?

## 2022-03-25 NOTE — Assessment & Plan Note (Signed)
S/p pacemaker, [place] MR conditional pacemaker ?

## 2022-03-25 NOTE — Progress Notes (Signed)
Lower extremity venous RT study completed. ? ?Preliminary results relayed to Florene Glen, MD. ? ?See CV Proc for preliminary results report.  ? ?Darlin Coco, RDMS, RVT ? ?

## 2022-03-25 NOTE — ED Notes (Signed)
Patient in imaging

## 2022-03-25 NOTE — Assessment & Plan Note (Addendum)
Patient is not on medication management as an outpatient. ?

## 2022-03-25 NOTE — ED Provider Notes (Signed)
?[facility] ?Provider Note ? ? ?CSN: [id number] ?Arrival date & time: 03/25/22  1443 ? ?  ? ?History ? ?Chief Complaint  ?Patient presents with  ? Weakness  ? ? ?Austin Oliver is a 66 y.o. male recent hx of stroke on ASA, plavix, urinary retention, here with weakness. Patient had a recent stroke and was discharged from the hospital on May 2.  Patient also had urinary retention at that time a Foley was placed.  Patient states that since then he had recurrent falls.  He is also weaker on the right leg and noticed some right leg swelling.  Patient states that since last night, he has trouble getting up after the fall.  Patient has a pacemaker and last time they had to adjust the setting in order to get an MRI. ? ?The history is provided by the patient.  ? ?  ? ?Home Medications ?Prior to Admission medications   ?Medication Sig Start Date End Date Taking? Authorizing Provider  ?acetaminophen (TYLENOL) 325 MG tablet Take 1-2 tablets (325-650 mg total) by mouth every 6 (six) hours as needed for mild pain (pain score 1-3 or temp > 100.5). 11/01/21   Dorothyann Peng, PA-C  ?albuterol (VENTOLIN HFA) 108 (90 Base) MCG/ACT inhaler Inhale 2 puffs into the lungs every 6 (six) hours as needed for shortness of breath or wheezing. 05/22/21   [provider]  ?aspirin EC 81 MG tablet Take 81 mg by mouth every evening. Swallow whole.    [provider]  ?blood glucose meter kit and supplies KIT Dispense based on patient and insurance preference. Use up to four times daily as directed. 11/23/21   Florencia Reasons, MD  ?cholecalciferol (VITAMIN D3) 25 MCG (1000 UNIT) tablet Take 1,000 Units by mouth daily.    [provider]  ?clopidogrel (PLAVIX) 75 MG tablet Take 1 tablet (75 mg total) by mouth daily. 12/27/21   [name], MD  ?ezetimibe (ZETIA) 10 MG tablet Take 10 mg by mouth in the morning.    [provider]  ?finasteride (PROSCAR) 5 MG tablet Take 5 mg  by mouth at bedtime. 06/13/21   [provider]  ?furosemide (LASIX) 20 MG tablet Take 1 tablet (20 mg total) by mouth daily as needed for fluid or edema. 03/18/22   Domenic Polite, MD  ?gabapentin (NEURONTIN) 100 MG capsule Take 200 mg by mouth at bedtime.    [provider]  ?levothyroxine (SYNTHROID) 112 MCG tablet Take 112 mcg by mouth daily before breakfast.    [provider]  ?loteprednol (LOTEMAX) 0.5 % ophthalmic suspension Place 1 drop into the left eye daily as needed (if having eye pain from shingles).    [provider]  ?nitroGLYCERIN (NITROSTAT) 0.4 MG SL tablet Place 1 tablet (0.4 mg total) under the tongue every 5 (five) minutes x 3 doses as needed for chest pain. 08/27/20   Chandrasekhar, Terisa Starr, MD  ?pantoprazole (PROTONIX) 40 MG tablet Take 40 mg by mouth daily after supper.     [provider]  ?psyllium (METAMUCIL) 58.6 % powder Take 1 packet by mouth 3 (three) times daily as needed (constipation).    [provider]  ?ranolazine (RANEXA) 1000 MG SR tablet TAKE 1  BY MOUTH TWICE DAILY ?Patient taking differently: Take 1,000 mg by mouth 2 (two) times daily. 10/09/21   [name], MD  ?rosuvastatin (CRESTOR) 5 MG tablet Take 1 tablet (5 mg total) by mouth daily.  12/27/21   [name], MD  ?shark liver oil-cocoa butter (PREPARATION H) 0.25-3-85.5 % suppository Place 1 suppository rectally daily as needed for hemorrhoids.    [provider]  ?tamsulosin (FLOMAX) 0.4 MG CAPS capsule Take 0.8 mg by mouth daily with supper. 10/02/20   [provider]  ?Tiotropium Bromide Monohydrate (SPIRIVA RESPIMAT) 2.5 MCG/ACT AERS Inhale 2 puffs into the lungs daily. 01/15/22   Laurin Coder, MD  ?vitamin B-12 (CYANOCOBALAMIN) 1000 MCG tablet Take 1,000 mcg by mouth in the morning.    [provider]  ?   ? ?Allergies    ?Bee venom, Cleocin [clindamycin hcl], and Statins   ? ?Review of Systems   ?Review of  Systems  ?Neurological:  Positive for weakness.  ?All other systems reviewed and are negative. ? ?Physical Exam ?Updated Vital Signs ?BP 114/71   Pulse 62   Temp 97.8 ?F (36.6 ?C) (Oral)   Resp 16   SpO2 97%  ?Physical Exam ?Vitals and nursing note reviewed.  ?Constitutional:   ?   Comments: Chronically ill-appearing  ?HENT:  ?   Head: Normocephalic.  ?   Nose: Nose normal.  ?   Mouth/Throat:  ?   Mouth: Mucous membranes are moist.  ?Eyes:  ?   Extraocular Movements: Extraocular movements intact.  ?   Pupils: Pupils are equal, round, and reactive to light.  ?Cardiovascular:  ?   Rate and Rhythm: Normal rate and regular rhythm.  ?   Pulses: Normal pulses.  ?   Heart sounds: Normal heart sounds.  ?Pulmonary:  ?   Effort: Pulmonary effort is normal.  ?   Breath sounds: Normal breath sounds.  ?Abdominal:  ?   General: Abdomen is flat.  ?   Palpations: Abdomen is soft.  ?Musculoskeletal:  ?   Cervical back: Normal range of motion and neck supple.  ?   Comments: Right knee replacement and some mild right knee tenderness and swelling.  Patient does have right calf swelling as well.  ?Skin: ?   General: Skin is warm.  ?Neurological:  ?   Comments: Strength is 3 out of 5 on the right leg and 4 out of 5 on the right arm and 5 out of 5 on the left side. No obvious facial droop.  ?Psychiatric:     ?   Mood and Affect: Mood normal.     ?   Behavior: Behavior normal.  ? ? ?ED Results / Procedures / Treatments   ?Labs ?(all labs ordered are listed, but only abnormal results are displayed) ?Labs Reviewed  ?CBC - Abnormal; Notable for the following components:  ?    Result Value  ? WBC 3.3 (*)   ? RBC 3.90 (*)   ? Hemoglobin 11.5 (*)   ? HCT 35.9 (*)   ? Platelets 121 (*)   ? All other components within normal limits  ?COMPREHENSIVE METABOLIC PANEL - Abnormal; Notable for the following components:  ? Total Bilirubin 1.6 (*)   ? All other components within normal limits  ?URINALYSIS, ROUTINE W REFLEX MICROSCOPIC - Abnormal;  Notable for the following components:  ? Hgb urine dipstick LARGE (*)   ? All other components within normal limits  ?URINALYSIS, MICROSCOPIC (REFLEX) - Abnormal; Notable for the following components:  ? Bacteria, UA RARE (*)   ? All other components within normal limits  ?I-STAT CHEM 8, ED - Abnormal; Notable for the following components:  ? BUN 25 (*)   ? Calcium,  Ion 1.02 (*)   ? Hemoglobin 12.6 (*)   ? HCT 37.0 (*)   ? All other components within normal limits  ?RESP PANEL BY RT-PCR (FLU A&B, COVID) ARPGX2  ?URINE CULTURE  ?ETHANOL  ?PROTIME-INR  ?APTT  ?DIFFERENTIAL  ?RAPID URINE DRUG SCREEN, HOSP PERFORMED  ? ? ?EKG ?EKG Interpretation ? ?Date/Time:  Tuesday Mar 25 2022 16:25:14 EDT ?Ventricular Rate:  62 ?PR Interval:  199 ?QRS Duration: 122 ?QT Interval:  439 ?QTC Calculation: 446 ?R Axis:   90 ?Text Interpretation: Atrial-paced complexes Nonspecific intraventricular conduction delay No significant change since last tracing Confirmed by Wandra Arthurs [phone]) on 03/25/2022 5:22:32 PM ? ?Radiology ?CT HEAD WO CONTRAST (5MM) ? ?Result Date: 03/25/2022 ?CLINICAL DATA:  Neuro deficit, acute, stroke suspected worsening R leg weakness, hx of recurrent strokes EXAM: CT HEAD WITHOUT CONTRAST TECHNIQUE: Contiguous axial images were obtained from the base of the skull through the vertex without intravenous contrast. RADIATION DOSE REDUCTION: This exam was performed according to the departmental dose-optimization program which includes automated exposure control, adjustment of the mA and/or kV according to patient size and/or use of iterative reconstruction technique. COMPARISON:  MRI head 03/17/2022, CT 03/16/2022 FINDINGS: Brain: No evidence of acute intracranial hemorrhage or extra-axial collection.No evidence of mass lesion/concerning mass effect.The ventricles are normal in size.There is periventricular and subcortical white matter hypoattenuation, nonspecific and similar in distribution to recent MRI. Vascular: No  hyperdense vessel. Skull: Negative for skull fracture. Sinuses/Orbits: Mild ethmoid air cell mucosal thickening. Other: None. IMPRESSION: No new acute findings on noncontrast CT. Similar distribution of white matt

## 2022-03-25 NOTE — H&P (Signed)
?History and Physical  ? ? ?Austin Oliver KMQ:[id number] DOB: 1956-07-17 DOA: 03/25/2022 ? ?PCP: [name], MD  ?Patient coming from: home ? ?I have personally briefly reviewed patient's old medical records in [place] ? ?Chief Complaint: difficulty ambulating, right lower extremity weakness ? ?HPI: Austin Oliver is Austin Oliver 66 y.o. male with medical history significant of recurrent strokes, CAD, COPD, sick sinus syndrome s/p pacemaker placement, cirrhosis with recent admission for stroke with right lower extremity weakness.  He was discharged on 5/2/203 with plans for outpatient follow up on aspirin and plavix.  Concern from neurology was that strokes were related to hypoperfusion in setting of dehydration and diarrhea and baseline soft BP. ? ?At the time of discharge, he was walking with RW (based on therapy evaluation from that day).  That night, he felt things were working "fair" then he fell in the shower.  Since then he's noticed progressive weakness.  Weakness most notable in the right lower extremity, but has been noted in both legs.  He also feels weakness in his upper extremities, most notable when he's trying to use his walker.  Denies facial droop, wife noted possible slurred speech on Sunday.  He notes his BP was in 90's the last few mornings.  He has chronic asymmetric pupils related to hx shringles in L eye.  He notes chronic RLE paresethesias since his R knee surgery.  He's had urinary issues since January.  Diarrhea started in April, now constipation. ? ?ED Course: head CT, R knee films.  Neurology called.   ? ?Review of Systems: As per HPI otherwise all other systems reviewed and are negative. ? ?Past Medical History:  ?Diagnosis Date  ? Cirrhosis of liver ([place])   ? COPD (chronic obstructive pulmonary disease) ([place])   ? Coronary artery calcification seen on CAT scan 01/14/2017  ? Essential hypertension 09/11/2020  ? GERD (gastroesophageal reflux disease)   ? History of kidney stones   ?  Hyperlipidemia   ? Hypothyroidism   ? OSA on CPAP   ? Mild with AHI 7.8/hr >>on CPAP  ? Osteoarthritis   ? Presence of permanent cardiac pacemaker   ? Shingles 2012  ? ? ?Past Surgical History:  ?Procedure Laterality Date  ? CORONARY STENT INTERVENTION N/Burle Kwan 08/24/2020  ? Procedure: CORONARY STENT INTERVENTION;  Surgeon: [name], MD;  Location: [facility];  Service: Cardiovascular;  Laterality: N/Talma Aguillard;  ? HEEL SPUR SURGERY Left 80's  ? INTRAVASCULAR PRESSURE WIRE/FFR STUDY N/Demitrius Crass 04/25/2021  ? Procedure: INTRAVASCULAR PRESSURE WIRE/FFR STUDY;  Surgeon: [name], MD;  Location: [facility];  Service: Cardiovascular;  Laterality: N/Klyde Banka;  ? INTRAVASCULAR ULTRASOUND/IVUS N/Shery Wauneka 08/24/2020  ? Procedure: Intravascular Ultrasound/IVUS;  Surgeon: [name], MD;  Location: [facility];  Service: Cardiovascular;  Laterality: N/Kyana Aicher;  ? KNEE ARTHROPLASTY Right 10/31/2021  ? Procedure: COMPUTER ASSISTED TOTAL KNEE ARTHROPLASTY;  Surgeon: Rod Can, MD;  Location: [facility];  Service: Orthopedics;  Laterality: Right;  ? KNEE SURGERY Bilateral   ? numerous times  ? LEFT HEART CATH AND CORONARY ANGIOGRAPHY N/Erandy Mceachern 08/24/2020  ? Procedure: LEFT HEART CATH AND CORONARY ANGIOGRAPHY;  Surgeon: [name], MD;  Location: [facility];  Service: Cardiovascular;  Laterality: N/Shiya Fogelman;  ? LEFT HEART CATH AND CORONARY ANGIOGRAPHY N/Mckayla Mulcahey 04/25/2021  ? Procedure: LEFT HEART CATH AND CORONARY ANGIOGRAPHY;  Surgeon: [name], MD;  Location: [facility];  Service: Cardiovascular;  Laterality: N/Araceli Arango;  ? NECK SURGERY  70's  ?  PACEMAKER IMPLANT N/Biannca Scantlin 12/04/2020  ? Procedure: PACEMAKER IMPLANT;  Surgeon: [name], MD;  Location: [facility];  Service: Cardiovascular;  Laterality: N/Terry Bolotin;  ? TOTAL HIP ARTHROPLASTY Left 03/09/2017  ? Procedure: LEFT TOTAL HIP ARTHROPLASTY ANTERIOR APPROACH;  Surgeon: Rod Can, MD;  Location: [facility];  Service: Orthopedics;  Laterality: Left;  Dr. requesting RNFA   ? ? ?Social History ? reports that he quit smoking about 5 years ago. His smoking use included cigarettes. He has Zohar Maroney 90.00 pack-year smoking history. He has never used smokeless tobacco. He reports that he does not drink alcohol and does not use drugs. ? ?Allergies  ?Allergen Reactions  ? Bee Venom Swelling  ?  Extreme Swelling of site    ? Cleocin [Clindamycin Hcl] Rash  ?  RASH IN BETWEEN FINGERS  ? Statins Other (See Comments)  ?  Muscles aches with several statins  ? ? ?Family History  ?Problem Relation Age of Onset  ? Dementia Mother   ? Heart Problems Father   ? COPD Father   ? Lung cancer Father   ? ?Prior to Admission medications   ?Medication Sig Start Date End Date Taking? Authorizing Provider  ?acetaminophen (TYLENOL) 325 MG tablet Take 1-2 tablets (325-650 mg total) by mouth every 6 (six) hours as needed for mild pain (pain score 1-3 or temp > 100.5). 11/01/21  Yes Dorothyann Peng, PA-C  ?aspirin EC 81 MG tablet Take 81 mg by mouth every evening. Swallow whole.   Yes [provider]  ?cholecalciferol (VITAMIN D3) 25 MCG (1000 UNIT) tablet Take 1,000 Units by mouth daily.   Yes [provider]  ?clopidogrel (PLAVIX) 75 MG tablet Take 1 tablet (75 mg total) by mouth daily. 12/27/21  Yes Chandrasekhar, Mahesh Denilson Salminen, MD  ?ezetimibe (ZETIA) 10 MG tablet Take 10 mg by mouth in the morning.   Yes [provider]  ?finasteride (PROSCAR) 5 MG tablet Take 5 mg by mouth at bedtime. 06/13/21  Yes [provider]  ?gabapentin (NEURONTIN) 100 MG capsule Take 200 mg by mouth at bedtime.   Yes [provider]  ?levothyroxine (SYNTHROID) 112 MCG tablet Take 112 mcg by mouth daily before breakfast.   Yes [provider]  ?nitroGLYCERIN (NITROSTAT) 0.4 MG SL tablet Place 1 tablet (0.4 mg total) under the tongue every 5 (five) minutes x 3 doses as needed for chest pain. 08/27/20  Yes Chandrasekhar, Mahesh Badr Piedra, MD  ?pantoprazole (PROTONIX) 40 MG tablet Take 40 mg by mouth daily  after supper.    Yes [provider]  ?ranolazine (RANEXA) 1000 MG SR tablet TAKE 1  BY MOUTH TWICE DAILY ?Patient taking differently: Take 1,000 mg by mouth 2 (two) times daily. 10/09/21  Yes Chandrasekhar, Mahesh Levaeh Vice, MD  ?rosuvastatin (CRESTOR) 5 MG tablet Take 1 tablet (5 mg total) by mouth daily. 12/27/21  Yes Chandrasekhar, Mahesh Azaryah Heathcock, MD  ?shark liver oil-cocoa butter (PREPARATION H) 0.25-3-85.5 % suppository Place 1 suppository rectally daily as needed for hemorrhoids.   Yes [provider]  ?tamsulosin (FLOMAX) 0.4 MG CAPS capsule Take 0.8 mg by mouth daily with supper. 10/02/20  Yes [provider]  ?Tiotropium Bromide Monohydrate (SPIRIVA RESPIMAT) 2.5 MCG/ACT AERS Inhale 2 puffs into the lungs daily. 01/15/22  Yes Olalere, Adewale Blayze Haen, MD  ?vitamin B-12 (CYANOCOBALAMIN) 1000 MCG tablet Take 1,000 mcg by mouth in the morning.   Yes [provider]  ?albuterol (VENTOLIN HFA) 108 (90 Base) MCG/ACT inhaler Inhale 2 puffs into the  lungs every 6 (six) hours as needed for shortness of breath or wheezing. ?Patient not taking: Reported on 03/25/2022 05/22/21   [provider]  ?blood glucose meter kit and supplies KIT Dispense based on patient and insurance preference. Use up to four times daily as directed. 11/23/21   Florencia Reasons, MD  ?furosemide (LASIX) 20 MG tablet Take 1 tablet (20 mg total) by mouth daily as needed for fluid or edema. ?Patient not taking: Reported on 03/25/2022 03/18/22   Domenic Polite, MD  ?loteprednol (LOTEMAX) 0.5 % ophthalmic suspension Place 1 drop into the left eye daily as needed (if having eye pain from shingles). ?Patient not taking: Reported on 03/25/2022    [provider]  ?psyllium (METAMUCIL) 58.6 % powder Take 1 packet by mouth 3 (three) times daily as needed (constipation). ?Patient not taking: Reported on 03/25/2022    [provider]  ? ? ?Physical Exam: ?Vitals:  ? 03/25/22 1550 03/25/22 1630 03/25/22 1804 03/25/22 1805  ?BP: 121/79  114/71    ?Pulse: 63 62    ?Resp:  16    ?Temp:      ?TempSrc:      ?SpO2: 99% 97%  97%  ?Weight:   95.3 kg   ?Height:   5' 9"  (1.753 m)   ? ? ?Constitutional: NAD, calm, comfortable ?Vitals:  ? 05/0

## 2022-03-25 NOTE — Assessment & Plan Note (Deleted)
Diarrhea, then constipation. Stable. ?

## 2022-03-25 NOTE — ED Provider Triage Note (Signed)
Emergency Medicine Provider Triage Evaluation Note ? ?JERMANI EBERLEIN , a 66 y.o. male  was evaluated in triage.  Pt complains of gradually worsening right leg weakness now nearly unable to move his leg at all.  He was recently in the hospital 4/30 to 5/2 and discharged home.  He states that since he left the hospital he has had worsening right leg weakness.  He states he has had no sudden changes over the course of the day today certainly no changes in the past 4 hours.  He denies any visual symptoms.  He has had numerous CVAs in the past. ? ?Review of Systems  ?Positive: Right leg weakness ?Negative: Fever ? ?Physical Exam  ?BP 127/85 (BP Location: Right Arm)   Pulse 65   Temp 97.8 ?F (36.6 ?C) (Oral)   Resp 16   SpO2 100%  ?Gen:   Awake, no distress   ?Resp:  Normal effort  ?MSK:   Moves extremities without difficulty  ?Other:   ? ?Right knee with old well-healed surgical scar but was remote ? ?Right leg weakness seems to be profound.  He is hardly able to move his leg off of the wheelchair he is in.  Left leg weak but able to move against gravity.  Bilateral upper extremities with 5/5 grip flexion extension elbows and no pronator drift.  Smile symmetric speaking in full sentences alert and oriented ? ?Medical Decision Making  ?Medically screening exam initiated at 3:19 PM.  Appropriate orders placed.  DEONTA BOMBERGER was informed that the remainder of the evaluation will be completed by another provider, this initial triage assessment does not replace that evaluation, and the importance of remaining in the ED until their evaluation is complete. ? ?Discussed with Thurmond Butts charge nurse who will move patient to bed as soon as he is able to.  Stroke work-up initiated he is outside of any window with his symptoms ongoing for 7 days.  We will move to room quickly however.  Blood pressure is not hypotensive at this time. ?  Tedd Sias, [place] ?03/25/22 1523 ? ?

## 2022-03-25 NOTE — Assessment & Plan Note (Addendum)
Chronic issue. Foley exchanged on admission, 5/9. Urinalysis not suggestive of infection. ?-Continue Foley, finasteride and Flomax. ?Supposed to follow-up with Dr. Gloriann Loan outpatient although currently hospitalized. ?Due to concern for development of neurogenic bladder would recommend to continue Foley catheter on discharge at this time around as well. ?

## 2022-03-25 NOTE — Assessment & Plan Note (Addendum)
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 Patient is on Aspirin/Plavix, Crestor, Ranexa and Zetia as an outpatient. Plavix and aspirin recommended to be held by IR for biopsy. -Continue Zetia and Ranexa -Holding aspirin and Plavix

## 2022-03-25 NOTE — Assessment & Plan Note (Addendum)
In setting of recent stroke and concern for demyelinating disease. PT/OT ordered and recommend inpatient rehabiliation. ?

## 2022-03-25 NOTE — Assessment & Plan Note (Deleted)
Initial concern for stroke but neurology more concerned for demyelenating disease. Initial MRI brain correlates with less likely stroke and more likely demyelinating disease. Neurology recommendation for MRI w/wo contrast of brain, cervical/thoracic/lumbar spine. PT recommending inpatient rehabilitation. 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 MRI thoracic/lumbar spine significant for abnormal cord signal at T3 and T4 levels suggestive of cord ischemia vs transverse myelitis. 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 ?

## 2022-03-25 NOTE — Assessment & Plan Note (Addendum)
Currently asymptomatic.  Continue Yupelri ?

## 2022-03-25 NOTE — Assessment & Plan Note (Addendum)
Metformin discontinued at prior hospitalization secondary to diarrhea. Most recent hemoglobin A1C of 5.4%. Well controlled. ?

## 2022-03-26 ENCOUNTER — Encounter (HOSPITAL_COMMUNITY): Payer: Self-pay | Admitting: Family Medicine

## 2022-03-26 ENCOUNTER — Observation Stay (HOSPITAL_COMMUNITY): Payer: Medicare Other

## 2022-03-26 DIAGNOSIS — Z7189 Other specified counseling: Secondary | ICD-10-CM | POA: Diagnosis not present

## 2022-03-26 DIAGNOSIS — D352 Benign neoplasm of pituitary gland: Secondary | ICD-10-CM | POA: Diagnosis present

## 2022-03-26 DIAGNOSIS — E785 Hyperlipidemia, unspecified: Secondary | ICD-10-CM | POA: Diagnosis present

## 2022-03-26 DIAGNOSIS — Y93E1 Activity, personal bathing and showering: Secondary | ICD-10-CM | POA: Diagnosis not present

## 2022-03-26 DIAGNOSIS — G8222 Paraplegia, incomplete: Secondary | ICD-10-CM | POA: Diagnosis not present

## 2022-03-26 DIAGNOSIS — R296 Repeated falls: Secondary | ICD-10-CM

## 2022-03-26 DIAGNOSIS — R7989 Other specified abnormal findings of blood chemistry: Secondary | ICD-10-CM | POA: Diagnosis not present

## 2022-03-26 DIAGNOSIS — G9511 Acute infarction of spinal cord (embolic) (nonembolic): Secondary | ICD-10-CM | POA: Diagnosis not present

## 2022-03-26 DIAGNOSIS — R112 Nausea with vomiting, unspecified: Secondary | ICD-10-CM | POA: Diagnosis not present

## 2022-03-26 DIAGNOSIS — Z8673 Personal history of transient ischemic attack (TIA), and cerebral infarction without residual deficits: Secondary | ICD-10-CM | POA: Diagnosis not present

## 2022-03-26 DIAGNOSIS — Z8616 Personal history of COVID-19: Secondary | ICD-10-CM | POA: Diagnosis not present

## 2022-03-26 DIAGNOSIS — C8333 Diffuse large B-cell lymphoma, intra-abdominal lymph nodes: Secondary | ICD-10-CM | POA: Diagnosis not present

## 2022-03-26 DIAGNOSIS — I10 Essential (primary) hypertension: Secondary | ICD-10-CM

## 2022-03-26 DIAGNOSIS — R338 Other retention of urine: Secondary | ICD-10-CM | POA: Diagnosis not present

## 2022-03-26 DIAGNOSIS — M879 Osteonecrosis, unspecified: Secondary | ICD-10-CM | POA: Diagnosis present

## 2022-03-26 DIAGNOSIS — E669 Obesity, unspecified: Secondary | ICD-10-CM | POA: Diagnosis present

## 2022-03-26 DIAGNOSIS — D6859 Other primary thrombophilia: Secondary | ICD-10-CM | POA: Diagnosis present

## 2022-03-26 DIAGNOSIS — I495 Sick sinus syndrome: Secondary | ICD-10-CM | POA: Diagnosis present

## 2022-03-26 DIAGNOSIS — I69341 Monoplegia of lower limb following cerebral infarction affecting right dominant side: Secondary | ICD-10-CM | POA: Diagnosis not present

## 2022-03-26 DIAGNOSIS — C7889 Secondary malignant neoplasm of other digestive organs: Secondary | ICD-10-CM | POA: Diagnosis present

## 2022-03-26 DIAGNOSIS — K5900 Constipation, unspecified: Secondary | ICD-10-CM | POA: Diagnosis not present

## 2022-03-26 DIAGNOSIS — Z20822 Contact with and (suspected) exposure to covid-19: Secondary | ICD-10-CM | POA: Diagnosis present

## 2022-03-26 DIAGNOSIS — R198 Other specified symptoms and signs involving the digestive system and abdomen: Secondary | ICD-10-CM | POA: Diagnosis not present

## 2022-03-26 DIAGNOSIS — C7971 Secondary malignant neoplasm of right adrenal gland: Secondary | ICD-10-CM | POA: Diagnosis present

## 2022-03-26 DIAGNOSIS — J449 Chronic obstructive pulmonary disease, unspecified: Secondary | ICD-10-CM | POA: Diagnosis present

## 2022-03-26 DIAGNOSIS — G893 Neoplasm related pain (acute) (chronic): Secondary | ICD-10-CM | POA: Diagnosis not present

## 2022-03-26 DIAGNOSIS — R531 Weakness: Secondary | ICD-10-CM

## 2022-03-26 DIAGNOSIS — R2990 Unspecified symptoms and signs involving the nervous system: Secondary | ICD-10-CM

## 2022-03-26 DIAGNOSIS — C8338 Diffuse large B-cell lymphoma, lymph nodes of multiple sites: Secondary | ICD-10-CM | POA: Diagnosis not present

## 2022-03-26 DIAGNOSIS — C801 Malignant (primary) neoplasm, unspecified: Secondary | ICD-10-CM | POA: Diagnosis present

## 2022-03-26 DIAGNOSIS — R630 Anorexia: Secondary | ICD-10-CM | POA: Diagnosis not present

## 2022-03-26 DIAGNOSIS — Z950 Presence of cardiac pacemaker: Secondary | ICD-10-CM | POA: Diagnosis not present

## 2022-03-26 DIAGNOSIS — E039 Hypothyroidism, unspecified: Secondary | ICD-10-CM | POA: Diagnosis present

## 2022-03-26 DIAGNOSIS — D649 Anemia, unspecified: Secondary | ICD-10-CM | POA: Diagnosis not present

## 2022-03-26 DIAGNOSIS — W182XXA Fall in (into) shower or empty bathtub, initial encounter: Secondary | ICD-10-CM | POA: Diagnosis present

## 2022-03-26 DIAGNOSIS — Z515 Encounter for palliative care: Secondary | ICD-10-CM | POA: Diagnosis not present

## 2022-03-26 DIAGNOSIS — C787 Secondary malignant neoplasm of liver and intrahepatic bile duct: Secondary | ICD-10-CM | POA: Diagnosis present

## 2022-03-26 DIAGNOSIS — G379 Demyelinating disease of central nervous system, unspecified: Secondary | ICD-10-CM | POA: Diagnosis present

## 2022-03-26 DIAGNOSIS — E119 Type 2 diabetes mellitus without complications: Secondary | ICD-10-CM | POA: Diagnosis present

## 2022-03-26 DIAGNOSIS — C7972 Secondary malignant neoplasm of left adrenal gland: Secondary | ICD-10-CM | POA: Diagnosis present

## 2022-03-26 DIAGNOSIS — E875 Hyperkalemia: Secondary | ICD-10-CM | POA: Diagnosis not present

## 2022-03-26 DIAGNOSIS — I714 Abdominal aortic aneurysm, without rupture, unspecified: Secondary | ICD-10-CM | POA: Diagnosis present

## 2022-03-26 DIAGNOSIS — R161 Splenomegaly, not elsewhere classified: Secondary | ICD-10-CM | POA: Diagnosis not present

## 2022-03-26 DIAGNOSIS — D61818 Other pancytopenia: Secondary | ICD-10-CM | POA: Diagnosis present

## 2022-03-26 DIAGNOSIS — K7460 Unspecified cirrhosis of liver: Secondary | ICD-10-CM | POA: Diagnosis present

## 2022-03-26 DIAGNOSIS — H53461 Homonymous bilateral field defects, right side: Secondary | ICD-10-CM | POA: Diagnosis present

## 2022-03-26 DIAGNOSIS — R339 Retention of urine, unspecified: Secondary | ICD-10-CM | POA: Diagnosis not present

## 2022-03-26 DIAGNOSIS — Z66 Do not resuscitate: Secondary | ICD-10-CM | POA: Diagnosis not present

## 2022-03-26 DIAGNOSIS — C7951 Secondary malignant neoplasm of bone: Secondary | ICD-10-CM | POA: Diagnosis not present

## 2022-03-26 LAB — CBC
HCT: 33.8 % — ABNORMAL LOW (ref 39.0–52.0)
Hemoglobin: 10.6 g/dL — ABNORMAL LOW (ref 13.0–17.0)
MCH: 28.8 pg (ref 26.0–34.0)
MCHC: 31.4 g/dL (ref 30.0–36.0)
MCV: 91.8 fL (ref 80.0–100.0)
Platelets: 108 10*3/uL — ABNORMAL LOW (ref 150–400)
RBC: 3.68 MIL/uL — ABNORMAL LOW (ref 4.22–5.81)
RDW: 14.6 % (ref 11.5–15.5)
WBC: 2.8 10*3/uL — ABNORMAL LOW (ref 4.0–10.5)
nRBC: 0 % (ref 0.0–0.2)

## 2022-03-26 LAB — URINALYSIS, ROUTINE W REFLEX MICROSCOPIC
Bilirubin Urine: NEGATIVE
Glucose, UA: NEGATIVE mg/dL
Ketones, ur: NEGATIVE mg/dL
Nitrite: NEGATIVE
Protein, ur: NEGATIVE mg/dL
Specific Gravity, Urine: 1.016 (ref 1.005–1.030)
pH: 5 (ref 5.0–8.0)

## 2022-03-26 LAB — LDL CHOLESTEROL, DIRECT: Direct LDL: 16.9 mg/dL (ref 0–99)

## 2022-03-26 LAB — LIPID PANEL
Cholesterol: 57 mg/dL (ref 0–200)
HDL: <10 mg/dL — ABNORMAL LOW (ref >40)
Triglycerides: 271 mg/dL — ABNORMAL HIGH (ref <150)
VLDL: 54 mg/dL — ABNORMAL HIGH (ref 0–40)

## 2022-03-26 LAB — GLUCOSE, CAPILLARY: Glucose-Capillary: 124 mg/dL — ABNORMAL HIGH (ref 70–99)

## 2022-03-26 LAB — FOLATE: Folate: 7.5 ng/mL (ref >5.9)

## 2022-03-26 LAB — VITAMIN B12: Vitamin B-12: 636 pg/mL (ref 180–914)

## 2022-03-26 MED ORDER — FINASTERIDE 5 MG PO TABS
5.0000 mg | ORAL_TABLET | Freq: Every day | ORAL | Status: DC
  Administered 2022-03-26 – 2022-04-03 (×9): 5 mg via ORAL
  Filled 2022-03-26 (×9): qty 1

## 2022-03-26 MED ORDER — TAMSULOSIN HCL 0.4 MG PO CAPS
0.4000 mg | ORAL_CAPSULE | Freq: Every day | ORAL | Status: DC
  Administered 2022-03-27 – 2022-04-03 (×8): 0.4 mg via ORAL
  Filled 2022-03-26 (×8): qty 1

## 2022-03-26 MED ORDER — LEVOTHYROXINE SODIUM 112 MCG PO TABS
112.0000 ug | ORAL_TABLET | Freq: Every day | ORAL | Status: DC
  Administered 2022-03-27 – 2022-04-04 (×9): 112 ug via ORAL
  Filled 2022-03-26 (×9): qty 1

## 2022-03-26 MED ORDER — CHLORHEXIDINE GLUCONATE CLOTH 2 % EX PADS
6.0000 | MEDICATED_PAD | Freq: Every day | CUTANEOUS | Status: DC
  Administered 2022-03-26 – 2022-04-02 (×7): 6 via TOPICAL

## 2022-03-26 MED ORDER — OXYCODONE HCL 5 MG PO TABS: 5.0000 mg | ORAL_TABLET | Freq: Once | ORAL | Status: DC | PRN

## 2022-03-26 MED ORDER — RANOLAZINE ER 500 MG PO TB12
1000.0000 mg | ORAL_TABLET | Freq: Two times a day (BID) | ORAL | Status: DC
  Administered 2022-03-26 – 2022-04-04 (×18): 1000 mg via ORAL
  Filled 2022-03-26 (×18): qty 2

## 2022-03-26 MED ORDER — ASPIRIN EC 81 MG PO TBEC
81.0000 mg | DELAYED_RELEASE_TABLET | Freq: Every evening | ORAL | Status: DC
  Administered 2022-03-26 – 2022-03-29 (×4): 81 mg via ORAL
  Filled 2022-03-26 (×4): qty 1

## 2022-03-26 MED ORDER — EZETIMIBE 10 MG PO TABS
10.0000 mg | ORAL_TABLET | Freq: Every morning | ORAL | Status: DC
Start: 2022-03-27 — End: 2022-04-04
  Administered 2022-03-27 – 2022-04-04 (×9): 10 mg via ORAL
  Filled 2022-03-26 (×10): qty 1

## 2022-03-26 MED ORDER — OXYCODONE HCL 5 MG PO TABS
2.5000 mg | ORAL_TABLET | Freq: Four times a day (QID) | ORAL | Status: DC | PRN
  Administered 2022-03-26 – 2022-04-04 (×16): 5 mg via ORAL
  Filled 2022-03-26 (×17): qty 1

## 2022-03-26 MED ORDER — CLOPIDOGREL BISULFATE 75 MG PO TABS
75.0000 mg | ORAL_TABLET | Freq: Every day | ORAL | Status: DC
  Administered 2022-03-27 – 2022-03-30 (×4): 75 mg via ORAL
  Filled 2022-03-26 (×4): qty 1

## 2022-03-26 NOTE — Evaluation (Signed)
Physical Therapy Evaluation ?Patient Details ?Name: Austin Oliver ?MRN: [id number] ?DOB: December 01, 1955 ?Today's Date: 03/26/2022 ? ?History of Present Illness ? The pt is a 66 yo male presenting 03/25/22 with recurrent falls and worsening B LE weakenss, R>L. CT suggestive of demylenating process vs microvascular ischemic disease. 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  ?Clinical Impression ? Pt admitted secondary to problem above with deficits below. Pt requiring min A for bed mobility and mod A +2 to stand. Pt requiring manual blocking of R knee secondary to buckling. Recommending AIR level therapies to increase independence and safety with mobility prior to return home. Will continue to follow acutely.    ?   ? ?Recommendations for follow up therapy are one component of a multi-disciplinary discharge planning process, led by the attending physician.  Recommendations may be updated based on patient status, additional functional criteria and insurance authorization. ? ?Follow Up Recommendations Acute inpatient rehab (3hours/day) ? ?  ?Assistance Recommended at Discharge Frequent or constant Supervision/Assistance  ?Patient can return home with the following ? Two people to help with walking and/or transfers;A lot of help with bathing/dressing/bathroom;Assistance with cooking/housework;Help with stairs or ramp for entrance;Assist for transportation ? ?  ?Equipment Recommendations Other (comment) (TBD)  ?Recommendations for Other Services ?    ?  ?Functional Status Assessment Patient has had a recent decline in their functional status and demonstrates the ability to make significant improvements in function in a reasonable and predictable amount of time.  ? ?  ?Precautions / Restrictions Precautions ?Precautions:  Fall ?Restrictions ?Weight Bearing Restrictions: No  ? ?  ? ?Mobility ? Bed Mobility ?Overal bed mobility: Needs Assistance ?Bed Mobility: Supine to Sit, Sit to Supine ?  ?  ?Supine to sit: Min assist, HOB elevated ?Sit to supine: Min assist ?  ?General bed mobility comments: assist to raise trunk and for R LE back into bed ?  ? ?Transfers ?Overall transfer level: Needs assistance ?Equipment used: 2 person hand held assist ?Transfers: Sit to/from Stand ?Sit to Stand: +2 physical assistance, Mod assist ?  ?  ?  ?  ?  ?General transfer comment: assist to rise and steady with R knee blocked, pt stabilizing on stretcher, flexed posture, unable to side step or shift weight ?  ? ?Ambulation/Gait ?  ?  ?  ?  ?  ?  ?  ?  ? ?Stairs ?  ?  ?  ?  ?  ? ?Wheelchair Mobility ?  ? ?Modified Rankin (Stroke Patients Only) ?  ? ?  ? ?Balance Overall balance assessment: History of Falls, Needs assistance ?Sitting-balance support: No upper extremity supported, Feet supported ?Sitting balance-Leahy Scale: Good ?  ?  ?Standing balance support: Bilateral upper extremity supported, During functional activity ?Standing balance-Leahy Scale: Poor ?Standing balance comment: dependent on +2 moderate assist ?  ?  ?  ?  ?  ?  ?  ?  ?  ?  ?  ?   ? ? ? ?Pertinent Vitals/Pain Pain Assessment ?Pain Assessment: Faces ?Faces Pain Scale: Hurts even more ?Pain Location: R knee with movement ?Pain Descriptors / Indicators: Grimacing, Guarding, Moaning ?Pain Intervention(s): Limited activity within patient's tolerance, Monitored during session, Repositioned  ? ? ?Home Living Family/patient expects to be discharged to:: Private residence ?Living Arrangements: Spouse/significant other ?Available  Help at Discharge: Family;Available 24 hours/day (wife cannot physically assist) ?Type of Home: House ?Home Access: Ramped entrance ?Entrance Stairs-Rails: None ?  ?  ?Home Layout: Multi-level;Able to live on main level with bedroom/bathroom ?Home Equipment: Rolling  Walker (2 wheels);BSC/3in1;Cane - single point;Shower seat;Grab bars - toilet;Grab bars - tub/shower;Wheelchair - manual ?Additional Comments: pt in OPPT  ?  ?Prior Function Prior Level of Function : Needs assist ?  ?  ?  ?  ?  ?  ?Mobility Comments: Has been using RW but did have falls prior to weakness. Used WC since falling in shower. Has needed some assist with transfers and mobility tasks ?ADLs Comments: Was independent prior to weakness. Needed assist since d/c from hospital ?  ? ? ?Hand Dominance  ? Dominant Hand: Right ? ?  ?Extremity/Trunk Assessment  ? Upper Extremity Assessment ?Upper Extremity Assessment: Defer to OT evaluation ?  ? ?Lower Extremity Assessment ?Lower Extremity Assessment: Generalized weakness;RLE deficits/detail ?RLE Deficits / Details: No AROM at ankle. Minimal muscle activation at hip and knee with MMT ?  ? ?Cervical / Trunk Assessment ?Cervical / Trunk Assessment: Normal  ?Communication  ? Communication: No difficulties (verbose)  ?Cognition Arousal/Alertness: Awake/alert ?Behavior During Therapy: [facility] for tasks assessed/performed ?Overall Cognitive Status: Within Functional Limits for tasks assessed ?  ?  ?  ?  ?  ?  ?  ?  ?  ?  ?  ?  ?  ?  ?  ?  ?General Comments: very well aware of limitations and hospital courses ?  ?  ? ?  ?General Comments   ? ?  ?Exercises    ? ?Assessment/Plan  ?  ?PT Assessment Patient needs continued PT services  ?PT Problem List Decreased strength;Decreased activity tolerance;Decreased balance;Decreased mobility ? ?   ?  ?PT Treatment Interventions DME instruction;Gait training;Stair training;Functional mobility training;Therapeutic activities;Therapeutic exercise;Balance training;Patient/family education   ? ?PT Goals (Current goals can be found in the Care Plan section)  ?Acute Rehab PT Goals ?Patient Stated Goal: to figure out what was going on ?PT Goal Formulation: With patient ?Time For Goal Achievement: 04/09/22 ?Potential to Achieve Goals: Good ? ?   ?Frequency Min 3X/week ?  ? ? ?Co-evaluation PT/OT/SLP Co-Evaluation/Treatment: Yes ?Reason for Co-Treatment: For patient/therapist safety;To address functional/ADL transfers ?PT goals addressed during session: Mobility/safety with mobility;Balance ?OT goals addressed during session: ADL's and self-care ?  ? ? ?  ?AM-PAC PT "6 Clicks" Mobility  ?Outcome Measure Help needed turning from your back to your side while in a flat bed without using bedrails?: A Little ?Help needed moving from lying on your back to sitting on the side of a flat bed without using bedrails?: A Little ?Help needed moving to and from a bed to a chair (including a wheelchair)?: Total ?Help needed standing up from a chair using your arms (e.g., wheelchair or bedside chair)?: Total ?Help needed to walk in hospital room?: Total ?Help needed climbing 3-5 steps with a railing? : Total ?6 Click Score: 10 ? ?  ?End of Session Equipment Utilized During Treatment: Gait belt ?Activity Tolerance: Patient tolerated treatment well ?Patient left: in bed;with call bell/phone within reach (on stretcher in ED) ?Nurse Communication: Mobility status ?PT Visit Diagnosis: Unsteadiness on feet (R26.81);Muscle weakness (generalized) (M62.81);Difficulty in walking, not elsewhere classified (R26.2);History of falling (Z91.81) ?  ? ?Time: 8416-6063 ?PT Time Calculation (min) (ACUTE ONLY): 23 min ? ? ?Charges:   PT Evaluation ?$PT Eval Moderate Complexity: 1 Mod ?  ?  ?   ? ? ?  Reuel Derby, PT, DPT  ?Acute Rehabilitation Services  ?Pager: [phone] ?Office: [phone] ? ? ?[place] ?03/26/2022, 12:46 PM ? ?

## 2022-03-26 NOTE — ED Notes (Signed)
Patient requesting pain medicine.  Patient informed that he had tylenol ordered that I could get for him.  Patient stated "I take tylenol at home for my knee and it works, but the doctor told me they would order me something strong before I go to MRI."  No orders noted at this time.  Inquired about patient's back pain.  Patient states "it doesn't hurt, it just feels funny, but it will hurt after the MRI."  MD contacted. ?

## 2022-03-26 NOTE — Evaluation (Signed)
Occupational Therapy Evaluation ?Patient Details ?Name: Austin Oliver ?MRN: [id number] ?DOB: 11-12-1956 ?Today's Date: 03/26/2022 ? ? ?History of Present Illness The pt is a 66 yo male presenting 03/25/22 with recurrent falls and worsening B LE weakenss, R>L. CT suggestive of demylenating process vs microvascular ischemic disease. 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  ? ?Clinical Impression ?  ?Pt had progressed from RW to cane following R TKA, returned to RW after recent CVA, but using a w/c since a fall in the shower. He has been needing assist for ADLs since his recent hospitalization. Pt presents with R knee pain, B LE weakness (R>L), poor standing balance and inability to ambulate. He requires +2 mod assist to stand, but was able to laterally scoot along EOB with min guard assist to reposition prior to returning to supine. Pt requires set up to total assist for ADLs. Recommending intensive rehab prior to return home.  ?   ? ?Recommendations for follow up therapy are one component of a multi-disciplinary discharge planning process, led by the attending physician.  Recommendations may be updated based on patient status, additional functional criteria and insurance authorization.  ? ?Follow Up Recommendations ? Acute inpatient rehab (3hours/day)  ?  ?Assistance Recommended at Discharge Frequent or constant Supervision/Assistance  ?Patient can return home with the following Two people to help with walking and/or transfers;A lot of help with bathing/dressing/bathroom;Assist for transportation;Help with stairs or ramp for entrance ? ?  ?Functional Status Assessment ? Patient has had a recent decline in their functional status and demonstrates the ability to make significant improvements in function in a reasonable and  predictable amount of time.  ?Equipment Recommendations ? Other (comment) (defer to next venue)  ?  ?Recommendations for Other Services   ? ? ?  ?Precautions / Restrictions Precautions ?Precautions: Fall ?Restrictions ?Weight Bearing Restrictions: No  ? ?  ? ?Mobility Bed Mobility ?Overal bed mobility: Needs Assistance ?Bed Mobility: Supine to Sit, Sit to Supine ?  ?  ?Supine to sit: Min assist, HOB elevated ?Sit to supine: Min assist ?  ?General bed mobility comments: assist to raise trunk and for R LE back into bed ?  ? ?Transfers ?Overall transfer level: Needs assistance ?Equipment used: 2 person hand held assist ?Transfers: Sit to/from Stand ?Sit to Stand: +2 physical assistance, Mod assist ?  ?  ?  ?  ?  ?General transfer comment: assist to rise and steady with R knee blocked, pt stabilizing on stretcher, flexed posture, unable to side step or shift weight ?  ? ?  ?Balance Overall balance assessment: History of Falls, Needs assistance ?Sitting-balance support: No upper extremity supported, Feet supported ?Sitting balance-Leahy Scale: Good ?  ?  ?Standing balance support: Bilateral upper extremity supported, During functional activity ?Standing balance-Leahy Scale: Poor ?Standing balance comment: dependent on +2 moderate assist ?  ?  ?  ?  ?  ?  ?  ?  ?  ?  ?  ?   ? ?ADL either performed or assessed with clinical judgement  ? ?ADL Overall ADL's : Needs assistance/impaired ?Eating/Feeding: Independent;Bed level ?  ?Grooming: Supervision/safety;Sitting ?  ?Upper Body Bathing: Supervision/ safety;Sitting ?  ?Lower Body Bathing: Total assistance;+2 for physical assistance;Sit to/from stand ?  ?Upper Body Dressing : Set up;Sitting ?  ?Lower Body Dressing: +  2 for physical assistance;Total assistance;Sit to/from stand ?  ?  ?  ?Toileting- Clothing Manipulation and Hygiene: +2 for physical assistance;Total assistance;Sit to/from stand ?  ?  ?  ?Functional mobility during ADLs:  (unable to ambulate) ?   ? ? ? 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   ?Perception   ?  ?Praxis   ?  ? ?Pertinent Vitals/Pain Pain Assessment ?Pain Assessment: Faces ?Faces Pain Scale: Hurts even more ?Pain Location: R knee with movement ?Pain Descriptors / Indicators: Grimacing, Guarding, Moaning ?Pain Intervention(s): Monitored during session, Repositioned  ? ? ? ?Hand Dominance Right ?  ?Extremity/Trunk Assessment Upper Extremity Assessment ?Upper Extremity Assessment: Overall WFL for tasks assessed ?  ?Lower Extremity Assessment ?Lower Extremity Assessment: Defer to PT evaluation ?  ?Cervical / Trunk Assessment ?Cervical / Trunk Assessment: Normal ?  ?Communication Communication ?Communication: No difficulties (verbose) ?  ?Cognition Arousal/Alertness: Awake/alert ?Behavior During Therapy: [facility] for tasks assessed/performed ?Overall Cognitive Status: Within Functional Limits for tasks assessed ?  ?  ?  ?  ?  ?  ?  ?  ?  ?  ?  ?  ?  ?  ?  ?  ?General Comments: very well aware of limitations and hospital courses ?  ?  ?General Comments    ? ?  ?Exercises   ?  ?Shoulder Instructions    ? ? ?Home Living Family/patient expects to be discharged to:: Private residence ?Living Arrangements: Spouse/significant other ?Available Help at Discharge: Family;Available 24 hours/day (wife cannot physically assist) ?Type of Home: House ?Home Access: Ramped entrance ?  ?Entrance Stairs-Rails: None ?Home Layout: Multi-level;Able to live on main level with bedroom/bathroom ?  ?  ?Bathroom Shower/Tub: Walk-in shower ?  ?Bathroom Toilet: Standard ?  ?  ?Home Equipment: Rolling Walker (2 wheels);BSC/3in1;Cane - single point;Shower seat;Grab bars - toilet;Grab bars - tub/shower;Wheelchair - manual ?  ?Additional Comments: pt in OPPT ?  ? ?  ?Prior Functioning/Environment Prior Level of Function : Needs assist ?  ?  ?  ?  ?  ?  ?Mobility  Comments: Has been using RW but did have falls prior to weakness. Used WC since falling in shower. Has needed some assist with transfers and mobility tasks ?ADLs Comments: Was independent prior to weakness. Needed assist since d/c from hospital ?  ? ?  ?  ?OT Problem List: Decreased strength;Decreased range of motion;Decreased activity tolerance;Impaired balance (sitting and/or standing);Decreased safety awareness;Impaired UE functional use;Decreased cognition;Decreased knowledge of use of DME or AE ?  ?   ?OT Treatment/Interventions: Self-care/ADL training;DME and/or AE instruction;Therapeutic activities;Patient/family education;Balance training  ?  ?OT Goals(Current goals can be found in the care plan section) Acute Rehab OT Goals ?OT Goal Formulation: With patient ?Time For Goal Achievement: 04/09/22 ?Potential to Achieve Goals: Good ?ADL Goals ?Pt Will Perform Lower Body Bathing: with min guard assist;sitting/lateral leans;with adaptive equipment ?Pt Will Perform Lower Body Dressing: with supervision;sitting/lateral leans;with adaptive equipment ?Pt Will Transfer to Toilet: with supervision;with transfer board;bedside commode ?Pt Will Perform Toileting - Clothing Manipulation and hygiene: with supervision;sitting/lateral leans ?Additional ADL Goal #1: Pt will stand with moderate assist as a precursor to ADLs.  ?OT Frequency: Min 2X/week ?  ? ?Co-evaluation PT/OT/SLP Co-Evaluation/Treatment: Yes ?Reason for Co-Treatment: For patient/therapist safety ?PT goals addressed during session: Mobility/safety with mobility;Balance ?OT goals addressed during session: ADL's and self-care ?  ? ?  ?  AM-PAC OT "6 Clicks" Daily Activity     ?Outcome Measure Help from another person eating meals?: None ?Help from another person taking care of personal grooming?: None ?Help from another person toileting, which includes using toliet, bedpan, or urinal?: A Lot ?Help from another person bathing (including washing, rinsing, drying)?:  A Lot ?Help from another person to put on and taking off regular upper body clothing?: A Little ?Help from another person to put on and taking off regular lower body clothing?: Total ?6 Click Score: 16 ?  ?En

## 2022-03-26 NOTE — Progress Notes (Signed)
?  Transition of Care (TOC) Screening Note ? ? ?Patient Details  ?Name: Austin Oliver ?Date of Birth: July 05, 1956 ? ? ?Transition of Care (TOC) CM/SW Contact:    ?Pollie Friar, RN ?Phone Number: ?03/26/2022, 3:47 PM ? ? ? ?Transition of Care Department [facility]) has reviewed patient. We will continue to monitor patient advancement through interdisciplinary progression rounds. If new patient transition needs arise, please place a TOC consult. ?  ?

## 2022-03-26 NOTE — Evaluation (Signed)
Speech Language Pathology Evaluation ?Patient Details ?Name: Austin Oliver ?MRN: [id number] ?DOB: 1956/02/13 ?Today's Date: 03/26/2022 ?Time: 3212-2482 ?SLP Time Calculation (min) (ACUTE ONLY): 20 min ? ?Problem List:  ?Patient Active Problem List  ? Diagnosis Date Noted  ? Stroke-like symptoms 03/25/2022  ? Acute urinary retention 03/25/2022  ? Abnormal bowel habits 03/25/2022  ? Sick sinus syndrome ([place]) 03/25/2022  ? Type 2 diabetes mellitus ([place]) 03/25/2022  ? History of knee replacement 03/25/2022  ? Recurrent falls 03/25/2022  ? Right sided weakness 03/16/2022  ? TIA (transient ischemic attack) 03/16/2022  ? Cardiac pacemaker in situ 03/05/2022  ? COPD (chronic obstructive pulmonary disease) ([place]) 03/05/2022  ? Gastroesophageal reflux disease 01/22/2022  ? Kidney stone 01/22/2022  ? Ultrasound scan abnormal 01/22/2022  ? Unspecified abnormal finding in specimens from other organs, systems and tissues 01/22/2022  ? Cirrhosis ([place]) 01/22/2022  ? History of CVA (cerebrovascular accident) 11/20/2021  ? Stiffness of right knee 11/04/2021  ? Osteoarthritis of right knee 10/31/2021  ? OSA on CPAP 08/26/2021  ? DOE (dyspnea on exertion) 04/04/2021  ? Anemia due to vitamin B12 deficiency 04/04/2021  ? Statin myopathy 09/18/2020  ? CAD (coronary artery disease) 09/11/2020  ? Essential hypertension 09/11/2020  ? Aortic atherosclerosis ([place]) 07/31/2020  ? Other pancytopenia ([place]) 03/29/2020  ? Bicytopenia 03/08/2020  ? Pain in right knee 11/22/2019  ? Cervical post-laminectomy syndrome 05/09/2019  ? DDD (degenerative disc disease), cervical 05/09/2019  ? Chronic neck pain 03/15/2019  ? Trigger finger of right hand 02/23/2018  ? Avascular necrosis of hip, left ([place]) 03/09/2017  ? Mixed hyperlipidemia 01/14/2017  ? Coronary artery disease with stable angina pectoris ([place]) 01/14/2017  ? ?Past Medical History:  ?Past Medical History:  ?Diagnosis Date  ? Cirrhosis of liver ([place])   ? COPD (chronic obstructive pulmonary disease)  ([place])   ? Coronary artery calcification seen on CAT scan 01/14/2017  ? Essential hypertension 09/11/2020  ? GERD (gastroesophageal reflux disease)   ? History of kidney stones   ? Hyperlipidemia   ? Hypothyroidism   ? OSA on CPAP   ? Mild with AHI 7.8/hr >>on CPAP  ? Osteoarthritis   ? Presence of permanent cardiac pacemaker   ? Shingles 2012  ? ?Past Surgical History:  ?Past Surgical History:  ?Procedure Laterality Date  ? CORONARY STENT INTERVENTION N/A 08/24/2020  ? Procedure: CORONARY STENT INTERVENTION;  Surgeon: [name], MD;  Location: [facility];  Service: Cardiovascular;  Laterality: N/A;  ? HEEL SPUR SURGERY Left 80's  ? INTRAVASCULAR PRESSURE WIRE/FFR STUDY N/A 04/25/2021  ? Procedure: INTRAVASCULAR PRESSURE WIRE/FFR STUDY;  Surgeon: [name], MD;  Location: [facility];  Service: Cardiovascular;  Laterality: N/A;  ? INTRAVASCULAR ULTRASOUND/IVUS N/A 08/24/2020  ? Procedure: Intravascular Ultrasound/IVUS;  Surgeon: [name], MD;  Location: [facility];  Service: Cardiovascular;  Laterality: N/A;  ? KNEE ARTHROPLASTY Right 10/31/2021  ? Procedure: COMPUTER ASSISTED TOTAL KNEE ARTHROPLASTY;  Surgeon: Rod Can, MD;  Location: [facility];  Service: Orthopedics;  Laterality: Right;  ? KNEE SURGERY Bilateral   ? numerous times  ? LEFT HEART CATH AND CORONARY ANGIOGRAPHY N/A 08/24/2020  ? Procedure: LEFT HEART CATH AND CORONARY ANGIOGRAPHY;  Surgeon: [name], MD;  Location: [facility];  Service: Cardiovascular;  Laterality: N/A;  ? LEFT HEART CATH AND CORONARY ANGIOGRAPHY N/A 04/25/2021  ? Procedure: LEFT HEART CATH AND CORONARY ANGIOGRAPHY;  Surgeon: [name], MD;  Location: [facility];  Service: Cardiovascular;  Laterality: N/A;  ? NECK SURGERY  70's  ? PACEMAKER IMPLANT N/A 12/04/2020  ? Procedure: PACEMAKER IMPLANT;  Surgeon: [name], MD;  Location: [facility];  Service: Cardiovascular;  Laterality: N/A;  ? TOTAL  HIP ARTHROPLASTY Left 03/09/2017  ? Procedure: LEFT TOTAL HIP ARTHROPLASTY ANTERIOR APPROACH;  Surgeon: Rod Can, MD;  Location: [facility];  Service: Orthopedics;  Laterality: Left;  Dr. requesting RNFA  ? ?HPI:  ?Pt is a 66 y/o male who presented 5/9 with recurrent falls and worsening B LE weakenss, R>L. CT head 5/9: negative for acute changes, but suggestive of demylenating process vs microvascular ischemic disease. PMH: recent L strokes L MCA and PCA territories and R cerebellum, multiple embolic bilateral strokes with mild left-sided weakness, CAD, AVN of L hip, R TKA, COPD, HLD, HTN, OSA on cpap, and pacemaker placement.  ? ?Assessment / Plan / Recommendation ?Clinical Impression ? Pt participated in speech-language-cognition evaluation. Pt reported that he has been having difficulty with memory, sequencing, attention, and staying on topic. Per the pt, he has been told that he has "the beginnings of dementia" and all his baseline symptoms are acutely worse. Pt stated that he is retired and was independent with medication and financial management prior to admission. [facility] Mental Status Examination was completed to evaluate the pt's cognitive-linguistic skills. He achieved a score of 24/30 which is below the normal limits of 27 or more out of 30 and is suggestive of a mild impairment. He exhibited difficulty in the areas of attention, memory, and executive function. Pt exhibited impairments in pragmatics related to global cohesion, and turn taking; however, his language and motor speech skills were otherwise functional. Skilled SLP services are clinically indicated at this time to improve cognitive-linguistic function. ?   ?SLP Assessment ? SLP Recommendation/Assessment: Patient needs continued [facility] Services ?SLP Visit Diagnosis: Cognitive communication deficit (R41.841)  ?  ?Recommendations for follow up therapy are one component of a multi-disciplinary discharge  planning process, led by the attending physician.  Recommendations may be updated based on patient status, additional functional criteria and insurance authorization. ?   ?Follow Up Recommendations ?  (Continued SLP services at level of care recommended by PT/OT)  ?  ?Assistance Recommended at Discharge ? Intermittent Supervision/Assistance  ?Functional Status Assessment Patient has had a recent decline in their functional status and demonstrates the ability to make significant improvements in function in a reasonable and predictable amount of time.  ?Frequency and Duration min 2x/week  ?2 weeks ?  ?   ?SLP Evaluation ?Cognition ? Overall Cognitive Status: Within Functional Limits for tasks assessed ?Arousal/Alertness: Awake/alert ?Orientation Level: Oriented X4 ?Year: 2023 ?Month: May ?Day of Week: Correct ?Attention: Focused;Sustained ?Focused Attention: Appears intact ?Sustained Attention: Impaired ?Sustained Attention Impairment: Verbal complex ?Memory: Impaired ?Memory Impairment: Retrieval deficit;Decreased short term memory (Immediate: 5/5 with repetition; delayed: 5/5; with additional processing time) ?Awareness: Appears intact ?Problem Solving: Impaired ?Problem Solving Impairment: Verbal complex (Money: 3/3; time: 1/1 with cues) ?Executive Function: Sequencing;Organizing ?Sequencing: Impaired ?Sequencing Impairment: Verbal complex (clock: 2/4) ?Organizing: Appears intact  ?  ?   ?Comprehension ? Auditory Comprehension ?Overall Auditory Comprehension: Appears within functional limits for tasks assessed ?Yes/No Questions: Within Functional Limits ?Commands: Within Functional Limits ?Conversation: Complex  ?  ?Expression Verbal Expression ?Overall Verbal Expression: Appears within functional limits for tasks assessed ?Initiation: No impairment ?Level of Generative/Spontaneous Verbalization: Conversation ?Repetition: No impairment ?Naming: No impairment ?Pragmatics: Impairment ?Impairments: Topic  maintenance ?Interfering Components: Attention ?  Written Expression ?Dominant Hand: Right   ?Oral / Motor ? Oral Motor/Sensory Function ?Overall Oral Motor/Sensory Function: Within functional limits ?Motor Speech ?Overall

## 2022-03-26 NOTE — ED Notes (Signed)
Patient states he's no longer hurting and is "ok" right now. ?

## 2022-03-26 NOTE — Progress Notes (Signed)
Neurology Progress Note ? ? ?S:// ?Patient is sitting on stretcher in NAD. No family at bedside. Waiting on going to a room. Awaiting MRIs to be done. No new neurological complaints.  ? ? ?O:// ?Current vital signs: ?BP 116/67 (BP Location: Left Arm)   Pulse (!) 59   Temp 98.3 ?F (36.8 ?C) (Oral)   Resp 20   Ht '5\' 9"'$  (1.753 m)   Wt 95.3 kg   SpO2 97%   BMI 31.01 kg/m?  ?Vital signs in last 24 hours: ?Temp:  [97.8 ?F (36.6 ?C)-98.3 ?F (36.8 ?C)] 98.3 ?F (36.8 ?C) (05/10 1045) ?Pulse Rate:  [53-68] 59 (05/10 1045) ?Resp:  [10-21] 20 (05/10 1045) ?BP: (105-132)/(51-85) 116/67 (05/10 1045) ?SpO2:  [93 %-100 %] 97 % (05/10 1045) ?Weight:  [95.3 kg] 95.3 kg (05/09 1804) ? ?GENERAL: Awake, alert in NAD ?HEENT: - Normocephalic and atraumatic, dry mm ?LUNGS - Clear to auscultation bilaterally with no wheezes ?CV - S1S2 RRR, no m/r/g, equal pulses bilaterally. ?ABDOMEN - Soft, nontender, nondistended with normoactive BS ?Ext: warm, well perfused, intact peripheral pulses, no  edema ?NEURO:  ?Mental Status: AA&Ox4 ?Language: speech is clear.  Naming, repetition, fluency, and comprehension intact. ?Cranial Nerves: PERRL 3 mm/brisk. EOMI, visual fields full, no facial asymmetry, facial sensation intact, hearing intact, tongue/uvula/soft palate midline, normal sternocleidomastoid and trapezius muscle strength. No evidence of tongue atrophy or fibrillations ?Motor: bilateral uppers 5/5, left lower 4/5, right lower 2/5  ?Reflexes: 3+ patellar, achilles left 3+, right 2 ?Tone: is normal and bulk is normal ?Sensation- Intact to light touch bilaterally ?Coordination: FTN intact bilaterally,  ?Gait- deferred ? ? ?Medications ? ?Current Facility-Administered Medications:  ?  acetaminophen (TYLENOL) tablet 650 mg, 650 mg, Oral, Q4H PRN, 650 mg at 03/26/22 0753 **OR** acetaminophen (TYLENOL) 160 MG/5ML solution 650 mg, 650 mg, Per Tube, Q4H PRN **OR** acetaminophen (TYLENOL) suppository 650 mg, 650 mg, Rectal, Q4H PRN, Elodia Florence., MD ?  enoxaparin (LOVENOX) injection 40 mg, 40 mg, Subcutaneous, Q24H, Elodia Florence., MD, 40 mg at 03/25/22 2250 ?  oxyCODONE (Oxy IR/ROXICODONE) immediate release tablet 5 mg, 5 mg, Oral, Once PRN, Opyd, Ilene Qua, MD ?  senna-docusate (Senokot-S) tablet 1 tablet, 1 tablet, Oral, QHS PRN, Elodia Florence., MD ? ? ?Imaging ?I have reviewed images in epic and the results pertinent to this consultation are: ? ?CT-scan of the brain 5/9: ?No new acute findings on noncontrast CT. Similar distribution of white matter disease in comparison to recent MRI, nonspecific but suggestive of demyelinating process and/or microvascular ischemic disease. ? ?Lumbar XRay  ?No recent fracture is seen.  Degenerative changes are noted. ? There is 14 mm calcific density overlying the right kidney suggesting right renal stone. ? ?Thoracic spine  ?No recent fracture is seen. Degenerative changes are noted in the lower cervical spine and thoracic spine ? ?Assessment:  ?66 y.o. 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 Of note, he had urinary retention at the time of hospitalization in  early may. ?Since discharge he has had recurrent falls and progressive weakness of RLE. He does report falling in the shower prior to admission and hurting his right knee  ? ?- VIt B12 636 ?- Copper, ceruloplasmin, Zinc, folate and methylmalonic pending   ? ?Recommendations: ?- MRI w/wo brain, cervical, thoracic and lumbar spine (already ordered) ?- Neurology will continue to follow  ? ?Beulah Gandy DNP, ACNPC-AG  ? ? ?  ?

## 2022-03-26 NOTE — ED Notes (Signed)
Orders placed for pain medication.  MRI staff arrived for patient before medication was verified by pharmacy.  Patient informed of this and stated he would get it when he got back from MRI. ?

## 2022-03-26 NOTE — Progress Notes (Signed)
? ?PROGRESS NOTE ? ? ? ?Austin GERDEMAN  IRS:[id number] DOB: November 14, 1956 DOA: 03/25/2022 ?PCP: [name], MD ? ? ?Brief Narrative: ?Austin Oliver is a 66 y.o. male with medical history significant of recurrent strokes, CAD, COPD, sick sinus syndrome s/p pacemaker placement, cirrhosis with recent admission for stroke with right lower extremity weakness with concern for possible demyelination disease. ? ? ?Assessment and Plan: ?Stroke-like symptoms ?Initial concern for stroke but neurology more concerned for demyelenating disease. Initial MRI brain correlates with less likely stroke and more likely demyelinating disease. Neurology recommendation for MRI w/wo contrast of brain, cervical/thoracic/lumbar spine.  ? ?Recurrent falls ?In setting of recent stroke and concern for demyelinating disease. PT/OT ordered and recommend inpatient rehabiliation. ? ?History of CVA (cerebrovascular accident) ?Patient is on Aspirin/Plavix, Crestor, Ranexa and Zetia as an outpatient. ?-Resume outpatient regimen. ? ?Acute urinary retention ?Chronic issue. Foley exchanged on admission, 5/9. Urinalysis not suggestive of infection. ?-Continue Foley, finasteride and Flomax ? ?Abnormal bowel habits ?Diarrhea, then constipation ? ?CAD (coronary artery disease) ?Patient is on Aspirin, Plavix, Ranexa, Crestor as an outpatient. No current chest pain. ? ?Sick sinus syndrome ([place]) ?S/p pacemaker, [place] MR conditional pacemaker ? ?Essential hypertension ?Patient is not on medication management as an outpatient. ? ?Type 2 diabetes mellitus ([place]) ?Metformin d/c'd at last hospital stay due to diarrhea ? ?COPD (chronic obstructive pulmonary disease) ([place]) ?Currently asymptomatic.  ? ?Cirrhosis ([place]) ?Noted on prior abdominal ultrasound. ? ? ? ?DVT prophylaxis: Lovenox ?Code Status:   Code Status: Full Code ?Family Communication: None at bedside ?Disposition Plan: Discharge to inpatient rehabilitation pending bed availability and neurology  workup ? ? ?Consultants:  ?Neurology ? ?Procedures:  ?None ? ?Antimicrobials: ?None  ? ? ?Subjective: ?No significant issues this morning. Continues to have RLE weakness ? ?Objective: ?BP 120/72 (BP Location: Left Arm)   Pulse (!) 59   Temp 98.3 ?F (36.8 ?C) (Oral)   Resp 17   Ht '5\' 9"'$  (1.753 m)   Wt 95.3 kg   SpO2 97%   BMI 31.01 kg/m?  ? ?Examination: ? ?General exam: Appears calm and comfortable ?Respiratory system: Clear to auscultation. Respiratory effort normal. ?Cardiovascular system: S1 & S2 heard, RRR. ?Gastrointestinal system: Abdomen is nondistended, soft and nontender. No organomegaly or masses felt. Normal bowel sounds heard. ?Central nervous system: Alert and oriented. 5/5 bilateral upper and LLE strength. 1/5 RLE strength ?Musculoskeletal: No edema. No calf tenderness ?Skin: No cyanosis. No rashes ?Psychiatry: Judgement and insight appear normal. Mood & affect appropriate.  ? ? ?Data Reviewed: I have personally reviewed following labs and imaging studies ? ?CBC ?Lab Results  ?Component Value Date  ? WBC 2.8 (L) 03/25/2022  ? RBC 3.68 (L) 03/25/2022  ? HGB 10.6 (L) 03/25/2022  ? HCT 33.8 (L) 03/25/2022  ? MCV 91.8 03/25/2022  ? MCH 28.8 03/25/2022  ? PLT 108 (L) 03/25/2022  ? MCHC 31.4 03/25/2022  ? RDW 14.6 03/25/2022  ? LYMPHSABS 0.9 03/25/2022  ? MONOABS 0.6 03/25/2022  ? EOSABS 0.1 03/25/2022  ? BASOSABS 0.0 03/25/2022  ? ? ? ?Last metabolic panel ?Lab Results  ?Component Value Date  ? NA 136 03/25/2022  ? K 4.2 03/25/2022  ? CL 105 03/25/2022  ? CO2 22 03/25/2022  ? BUN 25 (H) 03/25/2022  ? CREATININE 1.24 03/25/2022  ? GLUCOSE 86 03/25/2022  ? GFRNONAA >60 03/25/2022  ? GFRAA 61 11/30/2020  ? CALCIUM 9.0 03/25/2022  ? PROT 7.1 03/25/2022  ? ALBUMIN 3.5 03/25/2022  ? BILITOT  1.6 (H) 03/25/2022  ? ALKPHOS 71 03/25/2022  ? AST 23 03/25/2022  ? ALT 20 03/25/2022  ? ANIONGAP 11 03/25/2022  ? ? ?GFR: ?Estimated Creatinine Clearance: 65.8 mL/min (by C-G formula based on SCr of 1.24  mg/dL). ? ?Recent Results (from the past 240 hour(s))  ?Resp Panel by RT-PCR (Flu A&B, Covid) Nasopharyngeal Swab     Status: None  ? Collection Time: 03/25/22  2:48 PM  ? Specimen: Nasopharyngeal Swab; Nasopharyngeal(NP) swabs in vial transport medium  ?Result Value Ref Range Status  ? SARS Coronavirus 2 by RT PCR NEGATIVE NEGATIVE Final  ?  Comment: (NOTE) ?SARS-CoV-2 target nucleic acids are NOT DETECTED. ? ?The SARS-CoV-2 RNA is generally detectable in upper respiratory ?specimens during the acute phase of infection. The lowest ?concentration of SARS-CoV-2 viral copies this assay can detect is ?138 copies/mL. A negative result does not preclude SARS-Cov-2 ?infection and should not be used as the sole basis for treatment or ?other patient management decisions. A negative result may occur with  ?improper specimen collection/handling, submission of specimen other ?than nasopharyngeal swab, presence of viral mutation(s) within the ?areas targeted by this assay, and inadequate number of viral ?copies(<138 copies/mL). A negative result must be combined with ?clinical observations, patient history, and epidemiological ?information. The expected result is Negative. ? ?Fact Sheet for Patients:  ?EntrepreneurPulse.com.au ? ?Fact Sheet for Healthcare Providers:  ?IncredibleEmployment.be ? ?This test is no t yet approved or cleared by the [place] FDA and  ?has been authorized for detection and/or diagnosis of SARS-CoV-2 by ?FDA under an Emergency Use Authorization (EUA). This EUA will remain  ?in effect (meaning this test can be used) for the duration of the ?COVID-19 declaration under Section 564(b)(1) of the Act, 21 ?U.S.C.section 360bbb-3(b)(1), unless the authorization is terminated  ?or revoked sooner.  ? ? ?  ? Influenza A by PCR NEGATIVE NEGATIVE Final  ? Influenza B by PCR NEGATIVE NEGATIVE Final  ?  Comment: (NOTE) ?The Xpert Xpress SARS-CoV-2/FLU/RSV plus assay is intended  as an aid ?in the diagnosis of influenza from Nasopharyngeal swab specimens and ?should not be used as a sole basis for treatment. Nasal washings and ?aspirates are unacceptable for Xpert Xpress SARS-CoV-2/FLU/RSV ?testing. ? ?Fact Sheet for Patients: ?EntrepreneurPulse.com.au ? ?Fact Sheet for Healthcare Providers: ?IncredibleEmployment.be ? ?This test is not yet approved or cleared by the [place] FDA and ?has been authorized for detection and/or diagnosis of SARS-CoV-2 by ?FDA under an Emergency Use Authorization (EUA). This EUA will remain ?in effect (meaning this test can be used) for the duration of the ?COVID-19 declaration under Section 564(b)(1) of the Act, 21 U.S.C. ?section 360bbb-3(b)(1), unless the authorization is terminated or ?revoked. ? ?Performed at [facility], [place] [address], [place], [place] ?[address] ?  ?Urine Culture     Status: Abnormal (Preliminary result)  ? Collection Time: 03/25/22  3:53 PM  ? Specimen: Urine, Catheterized  ?Result Value Ref Range Status  ? Specimen Description URINE, CATHETERIZED  Final  ? Special Requests NONE  Final  ? Culture (A)  Final  ?  10,000 COLONIES/mL STAPHYLOCOCCUS EPIDERMIDIS ?SUSCEPTIBILITIES TO FOLLOW ?Performed at [facility], [place] [address] [place], [place] [address] ?  ? Report Status PENDING  Incomplete  ?  ? ? ?Radiology Studies: ?DG Thoracic Spine 2 View ? ?Result Date: 03/25/2022 ?CLINICAL DATA:  Back pain EXAM: THORACIC SPINE 2 VIEWS COMPARISON:  None Available. FINDINGS: No recent fracture is seen. Alignment of posterior margins of vertebral  bodies is unremarkable. There is fusion of bodies of C5 and C6 vertebrae. Severe degenerative changes are noted in the lower cervical spine at C6-C7 level. Small bony spurs seen in the lower thoracic spine. Paraspinal soft tissues are unremarkable. Pacemaker battery is seen in the left infraclavicular region. IMPRESSION: No recent fracture is seen.  Degenerative changes are noted in the lower cervical spine and thoracic spine. Electronically Signed   By: Elmer Picker M.D.   On: 03/25/2022 20:23  ? ?DG Lumbar Spine 2-3 Views ? ?Result Date: 03/25/2022 ?CLINICAL DATA:  Back

## 2022-03-27 ENCOUNTER — Inpatient Hospital Stay (HOSPITAL_COMMUNITY): Payer: Medicare Other

## 2022-03-27 DIAGNOSIS — R531 Weakness: Secondary | ICD-10-CM | POA: Diagnosis not present

## 2022-03-27 DIAGNOSIS — Z8673 Personal history of transient ischemic attack (TIA), and cerebral infarction without residual deficits: Secondary | ICD-10-CM | POA: Diagnosis not present

## 2022-03-27 DIAGNOSIS — R338 Other retention of urine: Secondary | ICD-10-CM | POA: Diagnosis not present

## 2022-03-27 DIAGNOSIS — I10 Essential (primary) hypertension: Secondary | ICD-10-CM | POA: Diagnosis not present

## 2022-03-27 LAB — CBC WITH DIFFERENTIAL/PLATELET
Abs Immature Granulocytes: 0.01 10*3/uL (ref 0.00–0.07)
Basophils Absolute: 0 10*3/uL (ref 0.0–0.1)
Basophils Relative: 1 %
Eosinophils Absolute: 0.1 10*3/uL (ref 0.0–0.5)
Eosinophils Relative: 2 %
HCT: 32.9 % — ABNORMAL LOW (ref 39.0–52.0)
Hemoglobin: 10.6 g/dL — ABNORMAL LOW (ref 13.0–17.0)
Immature Granulocytes: 0 %
Lymphocytes Relative: 29 %
Lymphs Abs: 0.8 10*3/uL (ref 0.7–4.0)
MCH: 29.2 pg (ref 26.0–34.0)
MCHC: 32.2 g/dL (ref 30.0–36.0)
MCV: 90.6 fL (ref 80.0–100.0)
Monocytes Absolute: 0.5 10*3/uL (ref 0.1–1.0)
Monocytes Relative: 16 %
Neutro Abs: 1.4 10*3/uL — ABNORMAL LOW (ref 1.7–7.7)
Neutrophils Relative %: 52 %
Platelets: 129 10*3/uL — ABNORMAL LOW (ref 150–400)
RBC: 3.63 MIL/uL — ABNORMAL LOW (ref 4.22–5.81)
RDW: 14.6 % (ref 11.5–15.5)
WBC: 2.8 10*3/uL — ABNORMAL LOW (ref 4.0–10.5)
nRBC: 0 % (ref 0.0–0.2)

## 2022-03-27 LAB — URINE CULTURE: Culture: 10000 — AB

## 2022-03-27 LAB — CERULOPLASMIN: Ceruloplasmin: 28.7 mg/dL (ref 16.0–31.0)

## 2022-03-27 MED ORDER — GLUCERNA SHAKE PO LIQD
237.0000 mL | Freq: Two times a day (BID) | ORAL | Status: DC
Start: 2022-03-27 — End: 2022-04-04
  Administered 2022-03-27 – 2022-04-03 (×11): 237 mL via ORAL

## 2022-03-27 MED ORDER — GADOBUTROL 1 MMOL/ML IV SOLN: 10.0000 mL | Freq: Once | INTRAVENOUS | Status: DC | PRN

## 2022-03-27 MED ORDER — ADULT MULTIVITAMIN W/MINERALS CH
1.0000 | ORAL_TABLET | Freq: Every day | ORAL | Status: DC
  Administered 2022-03-27 – 2022-04-04 (×9): 1 via ORAL
  Filled 2022-03-27 (×9): qty 1

## 2022-03-27 MED ORDER — GADOBUTROL 1 MMOL/ML IV SOLN
10.0000 mL | Freq: Once | INTRAVENOUS | Status: AC | PRN
  Administered 2022-03-27: 10 mL via INTRAVENOUS

## 2022-03-27 NOTE — Progress Notes (Signed)
Inpatient Rehabilitation Admissions Coordinator  ? ?I await medical workup for determining diagnosis before proceeding with discussions and authorization for CIR. I will follow up on Friday. ? ?Danne Baxter, RN, MSN ?Rehab Admissions Coordinator ?[phone] ?03/27/2022 3:03 PM ? ?

## 2022-03-27 NOTE — Progress Notes (Signed)
Initial Nutrition Assessment ? ?DOCUMENTATION CODES:  ?Not applicable ? ?INTERVENTION:  ?Adjust diet to low sodium (2g) ?Glucerna Shake po TID, each supplement provides 220 kcal and 10 grams of protein ?MVI with minerals daily ? ?NUTRITION DIAGNOSIS:  ?Increased nutrient needs related to acute illness (cirrhosis, COPD, frequent hospitalizations) as evidenced by estimated needs. ? ?GOAL:  ?Patient will meet greater than or equal to 90% of their needs ? ?MONITOR:  ?PO intake, Labs, I & O's, Supplement acceptance ? ?REASON FOR ASSESSMENT:  ?Malnutrition Screening Tool ?  ? ?ASSESSMENT:  ?Pt with hx of recurrent strokes, CAD, COPD, cirrhosis, HTN, HLD, GERD, DM type 2, and hypothyroidism presented to ED with worsening weakness. ? ?Recently admitted for a TIA 4/30-5/2. ? ?Neurological workup ongoing, team feels symptoms are more consistent with demyelination disease versus a recurrent CVA.  ? ?Pt resting in bed at the time of assessment. Wife at bedside. Reviewed breakfast tray at bedside, pt had good intake of his meal this AM and states that appetite is at baseline. Eating as much or more than he would be at baseline.  ? ?9.2% weight loss noted over the last 6 months (11/9-5/9). Reviewed medical events that have occurred over the last 6 months and how they have impacted his weight and strength. Pt had knee replacement in December, followed by 2 strokes, COVID, and now his current admission. PT has been working with pt outpatient, but with each hospitalization it seems that it is taking longer to reach his baseline functional level. ? ?Discussed the importance of adequate nutrition intake while hospitalized to ensure lean body mass is preserved to assist in rehab efforts. Pt is agreeable to glucerna to help meet needs. Intake at this time is good, will change to a more calorically dense supplement if intake declines. ? ?Did briefly education on the importance of a heart healthy diet (low sodium) and ensuring adequate  intake of fiber and vegetables. Wife and pt state they plan to follow-up with the outpatient nutrition education center at discharge. Have had an appointment, but had to reschedule twice due to being re-admitted. Pt has received some diet education at cardiac rehab in the past.  ? ?Nutritionally Relevant Medications: ?Scheduled Meds: ? ezetimibe  10 mg Oral q AM  ? levothyroxine  112 mcg Oral QAC breakfast  ? ?PRN Meds: senna-docusate ? ?Labs Reviewed ? ?5/10 Vitamin Labs: ?Copper: pending ?Ceruloplasmin: 28.7 ?Vitamin B12: 636 ?Zinc: pending ?Methylmalonic Acid: pending ?Folate: 7.5 ? ?NUTRITION - FOCUSED PHYSICAL EXAM: ?Flowsheet Row Most Recent Value  ?Orbital Region No depletion  ?Thoracic and Lumbar Region No depletion  ?Buccal Region No depletion  ?Temple Region Mild depletion  ?Clavicle Bone Region No depletion  ?Clavicle and Acromion Bone Region No depletion  ?Scapular Bone Region No depletion  ?Dorsal Hand Mild depletion  ?Patellar Region No depletion  ?Anterior Thigh Region No depletion  ?Posterior Calf Region Mild depletion  ?Edema (RD Assessment) None  ?Hair Reviewed  ?Eyes Reviewed  ?Mouth Reviewed  ?Skin Reviewed  ?Nails Reviewed  ? ?Diet Order:   ?Diet Order   ? ?       ?  Diet Heart Room service appropriate? Yes; Fluid consistency: Thin  Diet effective now       ?  ? ?  ?  ? ?  ? ? ?EDUCATION NEEDS:  ?Education needs have been addressed ? ?Skin:  Skin Assessment: Reviewed RN Assessment ? ?Last BM:  5/7 ? ?Height:  ?Ht Readings from Last 1 Encounters:  ?03/25/22  $'5\' 9"'j$  (1.753 m)  ? ?Weight:  ?Wt Readings from Last 1 Encounters:  ?03/25/22 95.3 kg  ? ? ?Ideal Body Weight:  72.7 kg ? ?BMI:  Body mass index is 31.01 kg/m?. ? ?Estimated Nutritional Needs:  ?Kcal:  2000-2200 kcal/d ?Protein:  100-115g/d ?Fluid:  2-2.1 L/d ? ? ?Ranell Patrick, RD, LDN ?Clinical Dietitian ?RD pager # available in [place]  ?After hours/weekend pager # available in [place] ?

## 2022-03-27 NOTE — Progress Notes (Signed)
Neurology Progress Note ? ? ?S:// ?Patient is sitting on the side of the bed eating breakfast. Wife [name] at bedside. Waiting on MRI studies. No new neurological complaints.  ? ? ?O:// ?Current vital signs: ?BP 101/65   Pulse 63   Temp 98.4 ?F (36.9 ?C) (Oral)   Resp 16   Ht '5\' 9"'$  (1.753 m)   Wt 95.3 kg   SpO2 97%   BMI 31.01 kg/m?  ?Vital signs in last 24 hours: ?Temp:  [98 ?F (36.7 ?C)-98.4 ?F (36.9 ?C)] 98.4 ?F (36.9 ?C) (05/11 0830) ?Pulse Rate:  [59-63] 63 (05/11 0830) ?Resp:  [16-17] 16 (05/11 0402) ?BP: (101-120)/(63-72) 101/65 (05/11 0830) ?SpO2:  [97 %-98 %] 97 % (05/11 0830) ? ?GENERAL: Awake, alert in NAD ?HEENT: - Normocephalic and atraumatic, dry mm ?LUNGS - Clear to auscultation bilaterally with no wheezes ?CV - S1S2 RRR, no m/r/g, equal pulses bilaterally. ?ABDOMEN - Soft, nontender, nondistended with normoactive BS ?Ext: warm, well perfused, intact peripheral pulses, no  edema ?NEURO:  ?Mental Status: AA&Ox4 ?Language: speech is clear.  Naming, repetition, fluency, and comprehension intact. ?Cranial Nerves: PERRL 3 mm/brisk. EOMI, visual fields full, no facial asymmetry, facial sensation intact, hearing intact, tongue/uvula/soft palate midline, normal sternocleidomastoid and trapezius muscle strength. No evidence of tongue atrophy or fibrillations ?Motor: bilateral uppers 5/5, left lower 4/5, right lower 0/5  ?Reflexes: 3+ patellar, achilles left 3+, right 0 ?Tone: is normal and bulk is normal ?Sensation- Intact to light touch bilaterally ?Coordination: FTN intact bilaterally ?Gait- deferred ? ? ?Medications ? ?Current Facility-Administered Medications:  ?  acetaminophen (TYLENOL) tablet 650 mg, 650 mg, Oral, Q4H PRN, 650 mg at 03/26/22 1544 **OR** acetaminophen (TYLENOL) 160 MG/5ML solution 650 mg, 650 mg, Per Tube, Q4H PRN **OR** acetaminophen (TYLENOL) suppository 650 mg, 650 mg, Rectal, Q4H PRN, Elodia Florence., MD ?  aspirin EC tablet 81 mg, 81 mg, Oral, QPM, Mariel Aloe, MD,  81 mg at 03/26/22 2217 ?  Chlorhexidine Gluconate Cloth 2 % PADS 6 each, 6 each, Topical, Daily, Mariel Aloe, MD, 6 each at 03/26/22 1756 ?  clopidogrel (PLAVIX) tablet 75 mg, 75 mg, Oral, Daily, Mariel Aloe, MD, 75 mg at 03/27/22 0907 ?  enoxaparin (LOVENOX) injection 40 mg, 40 mg, Subcutaneous, Q24H, Elodia Florence., MD, 40 mg at 03/26/22 2203 ?  ezetimibe (ZETIA) tablet 10 mg, 10 mg, Oral, q AM, Mariel Aloe, MD, 10 mg at 03/27/22 8676 ?  finasteride (PROSCAR) tablet 5 mg, 5 mg, Oral, QHS, Mariel Aloe, MD, 5 mg at 03/26/22 2217 ?  levothyroxine (SYNTHROID) tablet 112 mcg, 112 mcg, Oral, QAC breakfast, Mariel Aloe, MD, 112 mcg at 03/27/22 7209 ?  oxyCODONE (Oxy IR/ROXICODONE) immediate release tablet 2.5-5 mg, 2.5-5 mg, Oral, Q6H PRN, Mariel Aloe, MD, 5 mg at 03/26/22 2321 ?  ranolazine (RANEXA) 12 hr tablet 1,000 mg, 1,000 mg, Oral, BID, Mariel Aloe, MD, 1,000 mg at 03/27/22 4709 ?  senna-docusate (Senokot-S) tablet 1 tablet, 1 tablet, Oral, QHS PRN, Elodia Florence., MD ?  tamsulosin [facility]) capsule 0.4 mg, 0.4 mg, Oral, Q supper, Mariel Aloe, MD ? ?Labs ?I have reviewed Labs in Epic and the results pertinent to this consultation are:  ? ?Vit B12- 636 ?Ceruloplasmin-28.7 ?Folate-7.5 ? ? ? ?Imaging ?I have reviewed images in epic and the results pertinent to this consultation are: ? ?CT-scan of the brain 5/9: ?No new acute findings on noncontrast CT. Similar distribution of white matter  disease in comparison to recent MRI, nonspecific but suggestive of demyelinating process and/or microvascular ischemic disease. ? ?Lumbar XRay  ?No recent fracture is seen.  Degenerative changes are noted. ? There is 14 mm calcific density overlying the right kidney suggesting right renal stone. ? ?Thoracic spine  ?No recent fracture is seen. Degenerative changes are noted in the lower cervical spine and thoracic spine ? ?Assessment:  ?66 y.o. 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 Of note, he had urinary retention at the time of hospitalization in early may.Since discharge he has had recurrent falls and progressive weakness of RLE. He does report falling in the shower prior to admission and hurting his right knee  ? ?Today on exam patient is alert and oriented x 4. No new neuro deficits nor new complaints. He does report pain in the right knee that he attributes to a recent fall, also hx right knee replacement. Motor strength - bilateral uppers 5/5, left lower 4/5, right lower 0/5. Reflexes: 3+ Left patella, 1+-2 Right patella, achilles left 3+, right 0.  ? ?Neurology will follow for lab and imaging results.  ? ? ?Recommendations: ?- Copper, Zinc, and methylmalonic pending  ?- MRI w/wo brain, cervical, thoracic and lumbar spine (already ordered). Per MRI tech can only get two MRI's at a time and will get MRI brain and Cervical spine today. MRI Thoracic and Lumbar spine scheduled for tomorrow.  ?- Neurology will continue to follow  ? ?Siena Poehler Evalina Field, AGNP ?Neurology APP ?03/27/22 ?3:05 PM ? ? ? ? ?  ?

## 2022-03-27 NOTE — Progress Notes (Signed)
? ?PROGRESS NOTE ? ? ? ?Austin Oliver  PNT:[id number] DOB: [birth year] DOA: 03/25/2022 ?PCP: [name], MD ? ? ?Brief Narrative: ?Austin Oliver is a 66 y.o. male with medical history significant of recurrent strokes, CAD, COPD, sick sinus syndrome s/p pacemaker placement, cirrhosis with recent admission for stroke with right lower extremity weakness with concern for possible demyelination disease. ? ? ?Assessment and Plan: ?Stroke-like symptoms ?Initial concern for stroke but neurology more concerned for demyelenating disease. Initial MRI brain correlates with less likely stroke and more likely demyelinating disease. Neurology recommendation for MRI w/wo contrast of brain, cervical/thoracic/lumbar spine.  ? ?Recurrent falls ?In setting of recent stroke and concern for demyelinating disease. PT/OT ordered and recommend inpatient rehabiliation. ? ?History of CVA (cerebrovascular accident) ?Patient is on Aspirin/Plavix, Crestor, Ranexa and Zetia as an outpatient. ?-Resume outpatient regimen. ? ?Acute urinary retention ?Chronic issue. Foley exchanged on admission, 5/9. Urinalysis not suggestive of infection. ?-Continue Foley, finasteride and Flomax ? ?Abnormal bowel habits ?Diarrhea, then constipation ? ?CAD (coronary artery disease) ?Patient is on Aspirin, Plavix, Ranexa, Crestor as an outpatient. No current chest pain. ? ?Sick sinus syndrome ([place]) ?S/p pacemaker, [place] MR conditional pacemaker ? ?Essential hypertension ?Patient is not on medication management as an outpatient. ? ?Type 2 diabetes mellitus ([place]) ?Metformin d/c'd at last hospital stay due to diarrhea ? ?COPD (chronic obstructive pulmonary disease) ([place]) ?Currently asymptomatic.  ? ?Cirrhosis ([place]) ?Noted on prior abdominal ultrasound. ? ? ? ?DVT prophylaxis: Lovenox ?Code Status:   Code Status: Full Code ?Family Communication: Wife sleeping at bedside ?Disposition Plan: Discharge to inpatient rehabilitation pending bed availability and  neurology workup ? ? ?Consultants:  ?Neurology ? ?Procedures:  ?None ? ?Antimicrobials: ?None  ? ? ?Subjective: ?Slept well overnight. Continued right leg weakness without improvement. ? ?Objective: ?BP 106/63 (BP Location: Left Arm)   Pulse (!) 59   Temp 98.3 ?F (36.8 ?C) (Oral)   Resp 16   Ht '5\' 9"'$  (1.753 m)   Wt 95.3 kg   SpO2 97%   BMI 31.01 kg/m?  ? ?Examination: ? ?General exam: Appears calm and comfortable ?Respiratory system: Clear to auscultation. Respiratory effort normal. ?Cardiovascular system: S1 & S2 heard, RRR. No murmurs. ?Gastrointestinal system: Abdomen is nondistended, soft and nontender. Normal bowel sounds heard. ?Central nervous system: Alert and oriented. 1/5 RLE strength ?Musculoskeletal: No edema. No calf tenderness ?Skin: No cyanosis. No rashes ?Psychiatry: Judgement and insight appear normal. Mood & affect appropriate.  ? ? ?Data Reviewed: I have personally reviewed following labs and imaging studies ? ?CBC ?Lab Results  ?Component Value Date  ? WBC 2.8 (L) 03/25/2022  ? RBC 3.68 (L) 03/25/2022  ? HGB 10.6 (L) 03/25/2022  ? HCT 33.8 (L) 03/25/2022  ? MCV 91.8 03/25/2022  ? MCH 28.8 03/25/2022  ? PLT 108 (L) 03/25/2022  ? MCHC 31.4 03/25/2022  ? RDW 14.6 03/25/2022  ? LYMPHSABS 0.9 03/25/2022  ? MONOABS 0.6 03/25/2022  ? EOSABS 0.1 03/25/2022  ? BASOSABS 0.0 03/25/2022  ? ? ? ?Last metabolic panel ?Lab Results  ?Component Value Date  ? NA 136 03/25/2022  ? K 4.2 03/25/2022  ? CL 105 03/25/2022  ? CO2 22 03/25/2022  ? BUN 25 (H) 03/25/2022  ? CREATININE 1.24 03/25/2022  ? GLUCOSE 86 03/25/2022  ? GFRNONAA >60 03/25/2022  ? GFRAA 61 11/30/2020  ? CALCIUM 9.0 03/25/2022  ? PROT 7.1 03/25/2022  ? ALBUMIN 3.5 03/25/2022  ? BILITOT 1.6 (H) 03/25/2022  ? ALKPHOS 71 03/25/2022  ?  AST 23 03/25/2022  ? ALT 20 03/25/2022  ? ANIONGAP 11 03/25/2022  ? ? ?GFR: ?Estimated Creatinine Clearance: 65.8 mL/min (by C-G formula based on SCr of 1.24 mg/dL). ? ?Recent Results (from the past 240 hour(s))   ?Resp Panel by RT-PCR (Flu A&B, Covid) Nasopharyngeal Swab     Status: None  ? Collection Time: 03/25/22  2:48 PM  ? Specimen: Nasopharyngeal Swab; Nasopharyngeal(NP) swabs in vial transport medium  ?Result Value Ref Range Status  ? SARS Coronavirus 2 by RT PCR NEGATIVE NEGATIVE Final  ?  Comment: (NOTE) ?SARS-CoV-2 target nucleic acids are NOT DETECTED. ? ?The SARS-CoV-2 RNA is generally detectable in upper respiratory ?specimens during the acute phase of infection. The lowest ?concentration of SARS-CoV-2 viral copies this assay can detect is ?138 copies/mL. A negative result does not preclude SARS-Cov-2 ?infection and should not be used as the sole basis for treatment or ?other patient management decisions. A negative result may occur with  ?improper specimen collection/handling, submission of specimen other ?than nasopharyngeal swab, presence of viral mutation(s) within the ?areas targeted by this assay, and inadequate number of viral ?copies(<138 copies/mL). A negative result must be combined with ?clinical observations, patient history, and epidemiological ?information. The expected result is Negative. ? ?Fact Sheet for Patients:  ?EntrepreneurPulse.com.au ? ?Fact Sheet for Healthcare Providers:  ?IncredibleEmployment.be ? ?This test is no t yet approved or cleared by the [place] FDA and  ?has been authorized for detection and/or diagnosis of SARS-CoV-2 by ?FDA under an Emergency Use Authorization (EUA). This EUA will remain  ?in effect (meaning this test can be used) for the duration of the ?COVID-19 declaration under Section 564(b)(1) of the Act, 21 ?U.S.C.section 360bbb-3(b)(1), unless the authorization is terminated  ?or revoked sooner.  ? ? ?  ? Influenza A by PCR NEGATIVE NEGATIVE Final  ? Influenza B by PCR NEGATIVE NEGATIVE Final  ?  Comment: (NOTE) ?The Xpert Xpress SARS-CoV-2/FLU/RSV plus assay is intended as an aid ?in the diagnosis of influenza from  Nasopharyngeal swab specimens and ?should not be used as a sole basis for treatment. Nasal washings and ?aspirates are unacceptable for Xpert Xpress SARS-CoV-2/FLU/RSV ?testing. ? ?Fact Sheet for Patients: ?EntrepreneurPulse.com.au ? ?Fact Sheet for Healthcare Providers: ?IncredibleEmployment.be ? ?This test is not yet approved or cleared by the [place] FDA and ?has been authorized for detection and/or diagnosis of SARS-CoV-2 by ?FDA under an Emergency Use Authorization (EUA). This EUA will remain ?in effect (meaning this test can be used) for the duration of the ?COVID-19 declaration under Section 564(b)(1) of the Act, 21 U.S.C. ?section 360bbb-3(b)(1), unless the authorization is terminated or ?revoked. ? ?Performed at [facility], [place] [address], [place], [place] ?[address] ?  ?Urine Culture     Status: Abnormal (Preliminary result)  ? Collection Time: 03/25/22  3:53 PM  ? Specimen: Urine, Catheterized  ?Result Value Ref Range Status  ? Specimen Description URINE, CATHETERIZED  Final  ? Special Requests NONE  Final  ? Culture (A)  Final  ?  10,000 COLONIES/mL STAPHYLOCOCCUS EPIDERMIDIS ?SUSCEPTIBILITIES TO FOLLOW ?Performed at [facility], [place] [address], [place], [place] [address] ?  ? Report Status PENDING  Incomplete  ?  ? ? ?Radiology Studies: ?DG Thoracic Spine 2 View ? ?Result Date: 03/25/2022 ?CLINICAL DATA:  Back pain EXAM: THORACIC SPINE 2 VIEWS COMPARISON:  None Available. FINDINGS: No recent fracture is seen. Alignment of posterior margins of vertebral bodies is unremarkable. There is fusion of bodies of C5  and C6 vertebrae. Severe degenerative changes are noted in the lower cervical spine at C6-C7 level. Small bony spurs seen in the lower thoracic spine. Paraspinal soft tissues are unremarkable. Pacemaker battery is seen in the left infraclavicular region. IMPRESSION: No recent fracture is seen. Degenerative changes are noted in the lower cervical  spine and thoracic spine. Electronically Signed   By: Elmer Picker M.D.   On: 03/25/2022 20:23  ? ?DG Lumbar Spine 2-3 Views ? ?Result Date: 03/25/2022 ?CLINICAL DATA:  Back pain EXAM: LUMBAR SPINE - 3 VIEW COMPARISON:

## 2022-03-28 ENCOUNTER — Inpatient Hospital Stay (HOSPITAL_COMMUNITY): Payer: Medicare Other

## 2022-03-28 DIAGNOSIS — D352 Benign neoplasm of pituitary gland: Secondary | ICD-10-CM | POA: Diagnosis present

## 2022-03-28 DIAGNOSIS — R9389 Abnormal findings on diagnostic imaging of other specified body structures: Secondary | ICD-10-CM

## 2022-03-28 DIAGNOSIS — Z8673 Personal history of transient ischemic attack (TIA), and cerebral infarction without residual deficits: Secondary | ICD-10-CM | POA: Diagnosis not present

## 2022-03-28 DIAGNOSIS — R531 Weakness: Secondary | ICD-10-CM | POA: Diagnosis not present

## 2022-03-28 DIAGNOSIS — R338 Other retention of urine: Secondary | ICD-10-CM | POA: Diagnosis not present

## 2022-03-28 DIAGNOSIS — I10 Essential (primary) hypertension: Secondary | ICD-10-CM | POA: Diagnosis not present

## 2022-03-28 MED ORDER — GADOBUTROL 1 MMOL/ML IV SOLN
9.5000 mL | Freq: Once | INTRAVENOUS | Status: AC | PRN
  Administered 2022-03-28: 9.5 mL via INTRAVENOUS

## 2022-03-28 NOTE — Progress Notes (Signed)
Physical Therapy Treatment ?Patient Details ?Name: Austin Oliver ?MRN: [id number] ?DOB: [birth year] ?Today's Date: 03/28/2022 ? ? ?History of Present Illness The pt is a 66 yo male presenting 03/25/22 with recurrent falls and worsening B LE weakenss, R>L. CT suggestive of demylenating process vs microvascular ischemic disease. 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 ? ?  ?PT Comments  ? ? Pt supine in bed on entry, able to provide detailed description of strength and sensation deficits R trunk and LE and some altered sensation in L foot. Pt exhibits trace level strength in R foot dorsi/plantarflexion, knee flex and ext and hip flex. Pt lacks ability to engage R trunk recover from L to R perturbation. Pt requires min A for bed mobility, and maxAx2 for sit<>stand. Worked on weight shift and weightbearing through R LE. Pt to return to MRI for additional imaging. D/c plans remain appropriate. PT will continue to follow acutely.  ?  ?Recommendations for follow up therapy are one component of a multi-disciplinary discharge planning process, led by the attending physician.  Recommendations may be updated based on patient status, additional functional criteria and insurance authorization. ? ?Follow Up Recommendations ? Acute inpatient rehab (3hours/day) ?  ?  ?Assistance Recommended at Discharge Frequent or constant Supervision/Assistance  ?Patient can return home with the following Two people to help with walking and/or transfers;A lot of help with bathing/dressing/bathroom;Assistance with cooking/housework;Help with stairs or ramp for entrance;Assist for transportation ?  ?Equipment Recommendations ? Other (comment) (TBD)  ?  ?Recommendations for Other Services   ? ? ?  ?Precautions / Restrictions Precautions ?Precautions:  Fall ?Restrictions ?Weight Bearing Restrictions: No  ?  ? ?Mobility ? Bed Mobility ?Overal bed mobility: Needs Assistance ?Bed Mobility: Supine to Sit, Sit to Supine ?  ?  ?Supine to sit: HOB elevated, Min assist ?Sit to supine: Min assist ?  ?General bed mobility comments: min A with pad for management of R hip and leg with coming to EoB, vc for hand placement on rail to bring trunk to upright, min A for managment of hips with pad and LE back into bed ?  ? ?Transfers ?Overall transfer level: Needs assistance ?Equipment used: Rolling walker (2 wheels) ?Transfers: Sit to/from Stand ?Sit to Stand: +2 physical assistance, Max assist, From elevated surface ?  ?  ?  ?  ?  ?General transfer comment: first attempt with blocking of R knee and therapist on either side pt unable to achieve fully upright, with PT at R knee to facilitate full extension pt requiring maxAx2 for coming to fully upright with R hip extension, especially with cues for weight shift onto R LE, 3x sit<>stand with weightshift  on R LE before pt fatigue ?  ? ?  ? ?Modified Rankin (Stroke Patients Only) ?Modified Rankin (Stroke Patients Only) ?Pre-Morbid Rankin Score: Moderate disability ?Modified Rankin: Moderately severe disability ? ? ?  ?Balance Overall balance assessment: History of Falls, Needs assistance ?Sitting-balance support: No upper extremity supported, Feet supported ?Sitting balance-Leahy Scale: Fair ?Sitting balance - Comments: can tolerate perturbation L to R but not R to L ?  ?Standing balance support: Bilateral upper extremity supported, During functional activity ?Standing balance-Leahy Scale: Zero ?Standing balance comment: dependent on +2 moderate assist ?  ?  ?  ?  ?  ?  ?  ?  ?  ?  ?  ?  ? ?  ?  Cognition Arousal/Alertness: Awake/alert ?Behavior During Therapy: [facility] for tasks assessed/performed ?Overall Cognitive Status: Within Functional Limits for tasks assessed ?  ?  ?  ?  ?  ?  ?  ?  ?  ?  ?  ?  ?  ?  ?  ?  ?General Comments:  continues to have good awareness of hospital course and specific deficits ?  ?  ? ?  ?   ?General Comments General comments (skin integrity, edema, etc.): VSS, awaiting MRI for clarification of possible myelination disease/deficit ?  ?  ? ?Pertinent Vitals/Pain Pain Assessment ?Pain Assessment: No/denies pain  ? ? ? ?PT Goals (current goals can now be found in the care plan section) Acute Rehab PT Goals ?Patient Stated Goal: to figure out what was going on ?PT Goal Formulation: With patient ?Time For Goal Achievement: 04/09/22 ?Potential to Achieve Goals: Good ?Progress towards PT goals: Progressing toward goals ? ?  ?Frequency ? ? ? Min 3X/week ? ? ? ?  ?PT Plan Current plan remains appropriate  ? ? ?Co-evaluation PT/OT/SLP Co-Evaluation/Treatment: Yes ?Reason for Co-Treatment: For patient/therapist safety ?PT goals addressed during session: Mobility/safety with mobility ?  ?  ? ?  ?AM-PAC PT "6 Clicks" Mobility   ?Outcome Measure ? Help needed turning from your back to your side while in a flat bed without using bedrails?: A Little ?Help needed moving from lying on your back to sitting on the side of a flat bed without using bedrails?: A Little ?Help needed moving to and from a bed to a chair (including a wheelchair)?: Total ?Help needed standing up from a chair using your arms (e.g., wheelchair or bedside chair)?: Total ?Help needed to walk in hospital room?: Total ?Help needed climbing 3-5 steps with a railing? : Total ?6 Click Score: 10 ? ?  ?End of Session Equipment Utilized During Treatment: Gait belt ?Activity Tolerance: Patient tolerated treatment well ?Patient left: in bed;with call bell/phone within reach (set up for lunch) ?Nurse Communication: Mobility status ?PT Visit Diagnosis: Unsteadiness on feet (R26.81);Muscle weakness (generalized) (M62.81);Difficulty in walking, not elsewhere classified (R26.2);History of falling (Z91.81) ?  ? ? ?Time: 2694-8546 ?PT Time Calculation (min) (ACUTE ONLY): 34  min ? ?Charges:  $Neuromuscular Re-education: 8-22 mins          ?          ? ?Alonda Weaber B. Migdalia Dk PT, DPT ?Acute Rehabilitation Services ?Please use secure chat or  ?Call Office [phone] ? ? ? ?[place] ?03/28/2022, 3:18 PM ? ?

## 2022-03-28 NOTE — Assessment & Plan Note (Addendum)
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 No clinical features currently concerning for hyperfunctioning lesion at this time.  ?

## 2022-03-28 NOTE — Progress Notes (Signed)
Speech Language Pathology Treatment: Cognitive-Linquistic  ?Patient Details ?Name: Austin Oliver ?MRN: [id number] ?DOB: 01-Sep-1956 ?Today's Date: 03/28/2022 ?Time: 6861-6837 ?SLP Time Calculation (min) (ACUTE ONLY): 17 min ? ?Assessment / Plan / Recommendation ?Clinical Impression ? Pt was seen for cognitive-linguistic treatment. He was alert and cooperative during the session and his attention was improved compared to when he was last seen for the initial evaluation. He completed a mental manipulation sequencing task with 80% accuracy increasing to 100% with cues. He completed time management problems and a medication management (prescription) task with 100% accuracy. Though not noted during the session, pt reported that he continues to have some difficulty with word retrieval. He was educated on compensatory strategies for word retrieval and he used these during structured tasks with prompts. SLP will continue to follow pt.   ?  ?HPI HPI: Pt is a 66 y/o male who presented 5/9 with recurrent falls and worsening B LE weakenss, R>L. CT head 5/9: negative for acute changes, but suggestive of demylenating process vs microvascular ischemic disease. PMH: recent L strokes L MCA and PCA territories and R cerebellum, multiple embolic bilateral strokes with mild left-sided weakness, CAD, AVN of L hip, R TKA, COPD, HLD, HTN, OSA on cpap, and pacemaker placement. ?  ?   ?SLP Plan ? Continue with current plan of care ? ?  ?  ?Recommendations for follow up therapy are one component of a multi-disciplinary discharge planning process, led by the attending physician.  Recommendations may be updated based on patient status, additional functional criteria and insurance authorization. ?  ? ?Recommendations  ?   ?   ?    ?   ? ? ? ? Follow Up Recommendations: Acute inpatient rehab (3hours/day) ?Assistance recommended at discharge: Intermittent Supervision/Assistance ?SLP Visit Diagnosis: Cognitive communication deficit (R41.841) ?Plan:  Continue with current plan of care ? ? ? ? ?  ?  ? ?Rhiannon Sassaman I. Hardin Negus, [place], CCC-SLP ?Acute Rehabilitation Services ?Office number [phone] ?Pager [phone] ? ?Austin Oliver ? ?03/28/2022, 1:24 PM ? ? ? ?

## 2022-03-28 NOTE — Care Management Important Message (Signed)
Important Message ? ?Patient Details  ?Name: Austin Oliver ?MRN: [id number] ?Date of Birth: [birth year] ? ? ?Medicare Important Message Given:  Yes ? ? ? ? ?Marjorie Lussier ?03/28/2022, 3:10 PM ?

## 2022-03-28 NOTE — Progress Notes (Signed)
Inpatient Rehabilitation Admissions Coordinator  ? ?Noted additional imaging pending today. I will follow up on Monday. ? ?Danne Baxter, RN, MSN ?Rehab Admissions Coordinator ?[phone] ?03/28/2022 2:33 PM ? ?

## 2022-03-28 NOTE — Progress Notes (Signed)
? ?PROGRESS NOTE ? ? ? ?Austin Oliver  PJA:[id number] DOB: Mar 22, 1956 DOA: 03/25/2022 ?PCP: [name], MD ? ? ?Brief Narrative: ?Austin Oliver is a 66 y.o. male with medical history significant of recurrent strokes, CAD, COPD, sick sinus syndrome s/p pacemaker placement, cirrhosis with recent admission for stroke with right lower extremity weakness with concern for possible demyelination disease. ? ?Assessment and Plan: ?Stroke-like symptoms ?Initial concern for stroke but neurology more concerned for demyelenating disease. Initial MRI brain correlates with less likely stroke and more likely demyelinating disease. Neurology recommendation for MRI w/wo contrast of brain, cervical/thoracic/lumbar spine. PT recommending inpatient rehabilitation. 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  ? ?Recurrent falls ?In setting of recent stroke and concern for demyelinating disease. PT/OT ordered and recommend inpatient rehabiliation. ? ?History of CVA (cerebrovascular accident) ?Patient is on Aspirin/Plavix, Crestor, Ranexa and Zetia as an outpatient. ?-Continue outpatient regimen. ? ?Acute urinary retention ?Chronic issue. Foley exchanged on admission, 5/9. Urinalysis not suggestive of infection. ?-Continue Foley, finasteride and Flomax ? ?Abnormal bowel habits ?Diarrhea, then constipation ? ?CAD (coronary artery disease) ?Patient is on Aspirin, Plavix, Ranexa, Crestor as an outpatient. No current chest pain. Direct LDL of 16.9. ? ?Sick sinus syndrome ([place]) ?S/p pacemaker, [place] MR conditional pacemaker ? ?Essential hypertension ?Patient is not on medication management as an outpatient. ? ?Type 2 diabetes mellitus ([place]) ?Metformin discontinued at prior hospitalization secondary to diarrhea. Most recent hemoglobin A1C of 5.4%. Well controlled. ? ?COPD (chronic obstructive pulmonary disease) ([place]) ?Currently asymptomatic.  ? ?Cirrhosis ([place]) ?Noted  on prior abdominal ultrasound. ? 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 No clinical features currently concerning for hyperfunctioning lesion at this time. ? ? ? ?DVT prophylaxis: Lovenox ?Code Status:   Code Status: Full Code ?Family Communication: None at bedside ?Disposition Plan: Discharge to inpatient rehabilitation pending bed availability and neurology workup ? ? ?Consultants:  ?Neurology ? ?Procedures:  ?None ? ?Antimicrobials: ?None  ? ? ?Subjective: ?Slept well again. Some right lower abdominal pain earlier which has improved with analgesics. ? ?Objective: ?BP 110/62 (BP Location: Left Arm)   Pulse 65   Temp 98.4 ?F (36.9 ?C) (Oral)   Resp 16   Ht '5\' 9"'$  (1.753 m)   Wt 95.3 kg   SpO2 95%   BMI 31.01 kg/m?  ? ?Examination: ? ?General exam: Appears calm and comfortable ?Respiratory system: Clear to auscultation. Respiratory effort normal. ?Cardiovascular system: S1 & S2 heard, RRR. No murmurs. ?Gastrointestinal system: Abdomen is nondistended, soft and nontender. Normal bowel sounds heard. ?Central nervous system: Alert and oriented. RLE with 0-1/5 strength. Able to wiggle his toes minimally ?Musculoskeletal: No edema. No calf tenderness ?Skin: No cyanosis. No rashes ?Psychiatry: Judgement and insight appear normal. Mood & affect appropriate.  ? ? ?Data Reviewed: I have personally reviewed following labs and imaging studies ? ?CBC ?Lab Results  ?Component Value Date  ? WBC 2.8 (L) 03/27/2022  ? RBC 3.63 (L) 03/27/2022  ? HGB 10.6 (L) 03/27/2022  ? HCT 32.9 (L) 03/27/2022  ? MCV 90.6 03/27/2022  ? MCH 29.2 03/27/2022  ? PLT 129 (L) 03/27/2022  ? MCHC 32.2 03/27/2022  ? RDW 14.6 03/27/2022  ? LYMPHSABS 0.8 03/27/2022  ? MONOABS 0.5 03/27/2022  ? EOSABS 0.1 03/27/2022  ? BASOSABS 0.0 03/27/2022  ? ? ? ?Last metabolic panel ?Lab Results  ?Component Value Date  ? NA 136 03/25/2022  ?  K 4.2 03/25/2022  ? CL 105  03/25/2022  ? CO2 22 03/25/2022  ? BUN 25 (H) 03/25/2022  ? CREATININE 1.24 03/25/2022  ? GLUCOSE 86 03/25/2022  ? GFRNONAA >60 03/25/2022  ? GFRAA 61 11/30/2020  ? CALCIUM 9.0 03/25/2022  ? PROT 7.1 03/25/2022  ? ALBUMIN 3.5 03/25/2022  ? BILITOT 1.6 (H) 03/25/2022  ? ALKPHOS 71 03/25/2022  ? AST 23 03/25/2022  ? ALT 20 03/25/2022  ? ANIONGAP 11 03/25/2022  ? ? ?GFR: ?Estimated Creatinine Clearance: 65.8 mL/min (by C-G formula based on SCr of 1.24 mg/dL). ? ?Recent Results (from the past 240 hour(s))  ?Resp Panel by RT-PCR (Flu A&B, Covid) Nasopharyngeal Swab     Status: None  ? Collection Time: 03/25/22  2:48 PM  ? Specimen: Nasopharyngeal Swab; Nasopharyngeal(NP) swabs in vial transport medium  ?Result Value Ref Range Status  ? SARS Coronavirus 2 by RT PCR NEGATIVE NEGATIVE Final  ?  Comment: (NOTE) ?SARS-CoV-2 target nucleic acids are NOT DETECTED. ? ?The SARS-CoV-2 RNA is generally detectable in upper respiratory ?specimens during the acute phase of infection. The lowest ?concentration of SARS-CoV-2 viral copies this assay can detect is ?138 copies/mL. A negative result does not preclude SARS-Cov-2 ?infection and should not be used as the sole basis for treatment or ?other patient management decisions. A negative result may occur with  ?improper specimen collection/handling, submission of specimen other ?than nasopharyngeal swab, presence of viral mutation(s) within the ?areas targeted by this assay, and inadequate number of viral ?copies(<138 copies/mL). A negative result must be combined with ?clinical observations, patient history, and epidemiological ?information. The expected result is Negative. ? ?Fact Sheet for Patients:  ?EntrepreneurPulse.com.au ? ?Fact Sheet for Healthcare Providers:  ?IncredibleEmployment.be ? ?This test is no t yet approved or cleared by the [place] FDA and  ?has been authorized for detection and/or diagnosis of SARS-CoV-2 by ?FDA under an  Emergency Use Authorization (EUA). This EUA will remain  ?in effect (meaning this test can be used) for the duration of the ?COVID-19 declaration under Section 564(b)(1) of the Act, 21 ?U.S.C.section 360bbb-3(b)(1), unless the authorization is terminated  ?or revoked sooner.  ? ? ?  ? Influenza A by PCR NEGATIVE NEGATIVE Final  ? Influenza B by PCR NEGATIVE NEGATIVE Final  ?  Comment: (NOTE) ?The Xpert Xpress SARS-CoV-2/FLU/RSV plus assay is intended as an aid ?in the diagnosis of influenza from Nasopharyngeal swab specimens and ?should not be used as a sole basis for treatment. Nasal washings and ?aspirates are unacceptable for Xpert Xpress SARS-CoV-2/FLU/RSV ?testing. ? ?Fact Sheet for Patients: ?EntrepreneurPulse.com.au ? ?Fact Sheet for Healthcare Providers: ?IncredibleEmployment.be ? ?This test is not yet approved or cleared by the [place] FDA and ?has been authorized for detection and/or diagnosis of SARS-CoV-2 by ?FDA under an Emergency Use Authorization (EUA). This EUA will remain ?in effect (meaning this test can be used) for the duration of the ?COVID-19 declaration under Section 564(b)(1) of the Act, 21 U.S.C. ?section 360bbb-3(b)(1), unless the authorization is terminated or ?revoked. ? ?Performed at [facility], [place] [address] [place], [place] ?[address] ?  ?Urine Culture     Status: Abnormal  ? Collection Time: 03/25/22  3:53 PM  ? Specimen: Urine, Catheterized  ?Result Value Ref Range Status  ? Specimen Description URINE, CATHETERIZED  Final  ? Special Requests   Final  ?  NONE ?Performed at [facility], [place] [address] [place], [place] [address] ?  ? Culture 10,000 COLONIES/mL STAPHYLOCOCCUS  EPIDERMIDIS (A)  Final  ? Report Status 03/27/2022 FINAL  Final  ? Organism ID, Bacteria STAPHYLOCOCCUS EPIDERMIDIS (A)  Final  ?    Susceptibility  ? Staphylococcus epidermidis - MIC*  ?  CIPROFLOXACIN <=0.5 SENSITIVE Sensitive   ?  GENTAMICIN <=0.5  SENSITIVE Sensitive   ?  NITROFURANTOIN <=16 SENSITIVE Sensitive   ?  OXACILLIN 1 RESISTANT Resistant   ?  TETRACYCLINE 2 SENSITIVE Sensitive   ?  VANCOMYCIN 2 SENSITIVE Sensitive   ?  TRIMETH/SULFA 20 SENSITIVE Sens

## 2022-03-28 NOTE — Progress Notes (Signed)
Neurology Progress Note ? ? ?S:// ?Patient is sitting in bed speaking to speech pathologist waiting on final MRI studies. No new neurological complaints.  ? ? ?O:// ?Current vital signs: ?BP 100/73 (BP Location: Left Arm)   Pulse 64   Temp 98.4 ?F (36.9 ?C) (Oral)   Resp 16   Ht '5\' 9"'$  (1.753 m)   Wt 95.3 kg   SpO2 96%   BMI 31.01 kg/m?  ?Vital signs in last 24 hours: ?Temp:  [98.4 ?F (36.9 ?C)-98.8 ?F (37.1 ?C)] 98.4 ?F (36.9 ?C) (05/12 0800) ?Pulse Rate:  [64-66] 64 (05/12 0800) ?Resp:  [16] 16 (05/12 0800) ?BP: (100-105)/(62-73) 100/73 (05/12 0800) ?SpO2:  [95 %-96 %] 96 % (05/12 0800) ? ?GENERAL: Awake, alert in NAD ?HEENT: - Normocephalic and atraumatic, dry mm ?LUNGS - Clear to auscultation bilaterally with no wheezes ?CV - S1S2 RRR, no m/r/g, equal pulses bilaterally. ?ABDOMEN - Soft, nontender, nondistended with normoactive BS ?Ext: warm, well perfused, intact peripheral pulses, no  edema ?NEURO:  ?Mental Status: AA&Ox4 ?Language: speech is clear.  Naming, repetition, fluency, and comprehension intact. ?Cranial Nerves: PERRL 3 mm/brisk. EOMI, visual fields full, no facial asymmetry, facial sensation intact, hearing intact, tongue/uvula/soft palate midline, normal sternocleidomastoid and trapezius muscle strength. No evidence of tongue atrophy or fibrillations ?Motor: bilateral uppers 5/5, left lower 4/5, right lower 0/5  ?Reflexes: 3+ patellar, achilles left 3+, right 0 ?Tone: is normal and bulk is normal ?Sensation- Intact to light touch bilaterally ?Coordination: FTN intact bilaterally ?Gait- deferred ? ? ?Medications ? ?Current Facility-Administered Medications:  ?  acetaminophen (TYLENOL) tablet 650 mg, 650 mg, Oral, Q4H PRN, 650 mg at 03/27/22 1826 **OR** acetaminophen (TYLENOL) 160 MG/5ML solution 650 mg, 650 mg, Per Tube, Q4H PRN **OR** acetaminophen (TYLENOL) suppository 650 mg, 650 mg, Rectal, Q4H PRN, Elodia Florence., MD ?  aspirin EC tablet 81 mg, 81 mg, Oral, QPM, Mariel Aloe,  MD, 81 mg at 03/27/22 1753 ?  Chlorhexidine Gluconate Cloth 2 % PADS 6 each, 6 each, Topical, Daily, Mariel Aloe, MD, 6 each at 03/28/22 1003 ?  clopidogrel (PLAVIX) tablet 75 mg, 75 mg, Oral, Daily, Mariel Aloe, MD, 75 mg at 03/28/22 1003 ?  enoxaparin (LOVENOX) injection 40 mg, 40 mg, Subcutaneous, Q24H, Elodia Florence., MD, 40 mg at 03/27/22 1837 ?  ezetimibe (ZETIA) tablet 10 mg, 10 mg, Oral, q AM, Mariel Aloe, MD, 10 mg at 03/28/22 0630 ?  feeding supplement (GLUCERNA SHAKE) (GLUCERNA SHAKE) liquid 237 mL, 237 mL, Oral, BID BM, Mariel Aloe, MD, 237 mL at 03/28/22 1003 ?  finasteride (PROSCAR) tablet 5 mg, 5 mg, Oral, QHS, Mariel Aloe, MD, 5 mg at 03/27/22 2112 ?  gadobutrol (GADAVIST) 1 MMOL/ML injection 10 mL, 10 mL, Intravenous, Once PRN, Donnetta Simpers, MD ?  levothyroxine (SYNTHROID) tablet 112 mcg, 112 mcg, Oral, QAC breakfast, Mariel Aloe, MD, 112 mcg at 03/28/22 0601 ?  multivitamin with minerals tablet 1 tablet, 1 tablet, Oral, Daily, Mariel Aloe, MD, 1 tablet at 03/28/22 1003 ?  oxyCODONE (Oxy IR/ROXICODONE) immediate release tablet 2.5-5 mg, 2.5-5 mg, Oral, Q6H PRN, Mariel Aloe, MD, 5 mg at 03/28/22 1055 ?  ranolazine (RANEXA) 12 hr tablet 1,000 mg, 1,000 mg, Oral, BID, Mariel Aloe, MD, 1,000 mg at 03/28/22 1003 ?  senna-docusate (Senokot-S) tablet 1 tablet, 1 tablet, Oral, QHS PRN, Elodia Florence., MD ?  tamsulosin [facility]) capsule 0.4 mg, 0.4 mg, Oral, Q supper, [place],  Evalee Jefferson, MD, 0.4 mg at 03/27/22 1753 ? ?Labs ?I have reviewed Labs in Epic and the results pertinent to this consultation are:  ?LDL- not calculated as Triglycerides were 271. (Allergy to statins and on Zetia) ?Vit B12- 636 ?Ceruloplasmin-28.7 ?Folate-7.5 ? ? ?Imaging ?I have reviewed images in epic and the results pertinent to this consultation are: ?CT-scan of the brain 5/9: ?No new acute findings on noncontrast CT. Similar distribution of white matter disease in comparison to  recent MRI, nonspecific but suggestive of demyelinating process and/or microvascular ischemic disease. ?Lumbar XRay  5/9: ?No recent fracture is seen.  Degenerative changes are noted. ? There is 14 mm calcific density overlying the right kidney suggesting right renal stone. ? ?Thoracic spine 5/9: ?No recent fracture is seen. Degenerative changes are noted in the lower cervical spine and thoracic spine ? ?MRI brain W/WO contrast 5/11-chronic strokes and 5 mm hypoenhancing nodule in the anterior pituitary.  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 ? ?MRI cervical spine with/without contrast 5/11-Interbody fusion changes at X4-5 without complicating features.No spinal or foraminal stenosis.Mild spinal and moderate left foraminal stenosis at C4-5.Mild spinal and moderate left foraminal stenosis at C6-7.Possible signal abnormality in the thoracic cord at the very bottom of the sagittal images at the T3-4 level.  ? ? ?Assessment:  ?66 y.o. 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 Of note, he had urinary retention at the time of hospitalization in early may.Since discharge he has had recurrent falls and progressive weakness of RLE. He does report falling in the shower prior to admission and hurting his right knee  ? ?03/27/22: Today on exam patient is alert and oriented x 4. No new neuro deficits nor new complaints. He does report pain in the right knee that he  attributes to a recent fall, also hx right knee replacement. Motor strength - bilateral uppers 5/5, left lower 4/5, right lower 0/5. Reflexes: 3+ Left patella, 1+-2 Right patella, achilles left 3+, right 0.  ? ?03/28/22: Exam Neurologically unchanged. In bed working with speech awaiting stretcher for MRI. No new complaints. Discussed results from MRI brain and cervical spine: At the very bottom of the sagittal images there appears to be some signal abnormality in the thoracic cord at the T3-4 level , Thoracic spine will help with further evaluation. I will follow for tests and give all updates once we get all scans.  ? ?Neurology will follow for lab and imaging results.  ? ? ?Recommendations: ?- Copper, Zinc, and methylmalonic still pending  ?- MRI Thoracic and Lumbar spine scheduled for today and on way for patient.  ?- Neurology will continue to follow  ? ?Julita Ozbun Evalina Field, AGNP ?Neurology APP ?03/28/22 ?11:11 AM ? ? ? ? ?  ?

## 2022-03-28 NOTE — Progress Notes (Signed)
Occupational Therapy Treatment ?Patient Details ?Name: Austin Oliver ?MRN: [id number] ?DOB: [birth year] ?Today's Date: 03/28/2022 ? ? ?History of present illness The pt is a 66 yo male presenting 03/25/22 with recurrent falls and worsening B LE weakenss, R>L. CT suggestive of demylenating process vs microvascular ischemic disease. 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 ?  ?OT comments ? Pt continues to report diffuse weakness, UEs fatiguing with standing trials, L LE and truncal. Pt needing supervision for sitting during grooming at EOB. Stood with 2 person max assist with focus of hip extension and weight shift toward R. Pt able to scoot hips back onto bed and laterally to reposition in bed prior to return to supine. Pt premedicated for pain.   ? ?Recommendations for follow up therapy are one component of a multi-disciplinary discharge planning process, led by the attending physician.  Recommendations may be updated based on patient status, additional functional criteria and insurance authorization. ?   ?Follow Up Recommendations ? Acute inpatient rehab (3hours/day)  ?  ?Assistance Recommended at Discharge Frequent or constant Supervision/Assistance  ?Patient can return home with the following ? Two people to help with walking and/or transfers;A lot of help with bathing/dressing/bathroom;Assist for transportation;Help with stairs or ramp for entrance ?  ?Equipment Recommendations ? Other (comment) (defer to next venue)  ?  ?Recommendations for Other Services   ? ?  ?Precautions / Restrictions Precautions ?Precautions: Fall ?Restrictions ?Weight Bearing Restrictions: No  ? ? ?  ? ?Mobility Bed Mobility ?Overal bed mobility: Needs Assistance ?Bed Mobility: Supine to Sit, Sit to Supine ?  ?  ?Supine to sit: HOB elevated, Min  assist ?Sit to supine: Min assist ?  ?General bed mobility comments: min A with pad for management of R hip and leg with coming to EoB, vc for hand placement on rail to bring trunk to upright, min A for managment of hips with pad and LE back into bed ?  ? ?Transfers ?Overall transfer level: Needs assistance ?Equipment used: Rolling walker (2 wheels) ?Transfers: Sit to/from Stand ?Sit to Stand: +2 physical assistance, Max assist, From elevated surface ?  ?  ?  ?  ?  ?General transfer comment: first attempt with blocking of R knee and therapist on either side pt unable to achieve fully upright, with PT at R knee to facilitate full extension pt requiring maxAx2 for coming to fully upright with R hip extension, especially with cues for weight shift onto R LE, 3x sit<>stand with weightshift  on R LE before pt fatigue ?  ?  ?Balance Overall balance assessment: History of Falls, Needs assistance ?Sitting-balance support: No upper extremity supported, Feet supported ?Sitting balance-Leahy Scale: Fair ?Sitting balance - Comments: can tolerate perturbation L to R but not R to L ?  ?Standing balance support: Bilateral upper extremity supported, During functional activity ?Standing balance-Leahy Scale: Zero ?Standing balance comment: dependent on +2 moderate assist ?  ?  ?  ?  ?  ?  ?  ?  ?  ?  ?  ?   ? ?ADL either performed or assessed with clinical judgement  ? ?ADL Overall ADL's : Needs assistance/impaired ?Eating/Feeding: Independent;Bed level ?  ?Grooming: Oral care;Brushing hair;Sitting;Supervision/safety ?  ?  ?  ?  ?  ?  ?  ?  ?  ?  ?  ?  ?  ?  ?  ?  ?  ?  ? ?  Extremity/Trunk Assessment   ?  ?  ?  ?  ?  ? ?Vision   ?  ?  ?Perception   ?  ?Praxis   ?  ? ?Cognition Arousal/Alertness: Awake/alert ?Behavior During Therapy: [facility] for tasks assessed/performed ?Overall Cognitive Status: Within Functional Limits for tasks assessed ?  ?  ?  ?  ?  ?  ?  ?  ?  ?  ?  ?  ?  ?  ?  ?  ?General Comments: continues to have good awareness  of hospital course and specific deficits ?  ?  ?   ?Exercises   ? ?  ?Shoulder Instructions   ? ? ?  ?General Comments   ? ? ?Pertinent Vitals/ Pain       Pain Assessment ?Pain Assessment: No/denies pain ? ?Home Living   ?  ?  ?  ?  ?  ?  ?  ?  ?  ?  ?  ?  ?  ?  ?  ?  ?  ?  ? ?  ?Prior Functioning/Environment    ?  ?  ?  ?   ? ?Frequency ? Min 2X/week  ? ? ? ? ?  ?Progress Toward Goals ? ?OT Goals(current goals can now be found in the care plan section) ? Progress towards OT goals: Progressing toward goals ? ?Acute Rehab OT Goals ?OT Goal Formulation: With patient ?Time For Goal Achievement: 04/09/22 ?Potential to Achieve Goals: Good  ?Plan Discharge plan remains appropriate   ? ?Co-evaluation ? ? ? PT/OT/SLP Co-Evaluation/Treatment: Yes ?Reason for Co-Treatment: For patient/therapist safety ?PT goals addressed during session: Mobility/safety with mobility ?OT goals addressed during session: ADL's and self-care ?  ? ?  ?AM-PAC OT "6 Clicks" Daily Activity     ?Outcome Measure ? ? Help from another person eating meals?: None ?Help from another person taking care of personal grooming?: A Little ?Help from another person toileting, which includes using toliet, bedpan, or urinal?: A Lot ?Help from another person bathing (including washing, rinsing, drying)?: A Lot ?Help from another person to put on and taking off regular upper body clothing?: A Little ?Help from another person to put on and taking off regular lower body clothing?: Total ?6 Click Score: 15 ? ?  ?End of Session Equipment Utilized During Treatment: Gait belt;Rolling walker (2 wheels) ? ?OT Visit Diagnosis: Unsteadiness on feet (R26.81);Other abnormalities of gait and mobility (R26.89);Muscle weakness (generalized) (M62.81);History of falling (Z91.81);Pain ?  ?Activity Tolerance Patient tolerated treatment well ?  ?Patient Left in bed;with call bell/phone within reach;with bed alarm set ?  ?Nurse Communication Mobility status ?  ? ?   ? ?Time:  6503-5465 ?OT Time Calculation (min): 30 min ? ?Charges: OT General Charges ?$OT Visit: 1 Visit ?OT Treatments ?$Self Care/Home Management : 8-22 mins ?Nestor Lewandowsky, OTR/L ?Acute Rehabilitation Services ?Pager: [phone] ?Office: [phone]  ? ?Malka So ?03/28/2022, 3:36 PM ?

## 2022-03-29 ENCOUNTER — Inpatient Hospital Stay (HOSPITAL_COMMUNITY): Payer: Medicare Other

## 2022-03-29 DIAGNOSIS — I10 Essential (primary) hypertension: Secondary | ICD-10-CM | POA: Diagnosis not present

## 2022-03-29 DIAGNOSIS — R296 Repeated falls: Secondary | ICD-10-CM | POA: Diagnosis not present

## 2022-03-29 DIAGNOSIS — R531 Weakness: Secondary | ICD-10-CM | POA: Diagnosis not present

## 2022-03-29 DIAGNOSIS — R338 Other retention of urine: Secondary | ICD-10-CM | POA: Diagnosis not present

## 2022-03-29 DIAGNOSIS — Z8673 Personal history of transient ischemic attack (TIA), and cerebral infarction without residual deficits: Secondary | ICD-10-CM | POA: Diagnosis not present

## 2022-03-29 LAB — CBC WITH DIFFERENTIAL/PLATELET
Abs Immature Granulocytes: 0.01 10*3/uL (ref 0.00–0.07)
Basophils Absolute: 0 10*3/uL (ref 0.0–0.1)
Basophils Relative: 1 %
Eosinophils Absolute: 0.1 10*3/uL (ref 0.0–0.5)
Eosinophils Relative: 3 %
HCT: 34 % — ABNORMAL LOW (ref 39.0–52.0)
Hemoglobin: 11 g/dL — ABNORMAL LOW (ref 13.0–17.0)
Immature Granulocytes: 0 %
Lymphocytes Relative: 25 %
Lymphs Abs: 0.8 10*3/uL (ref 0.7–4.0)
MCH: 29.1 pg (ref 26.0–34.0)
MCHC: 32.4 g/dL (ref 30.0–36.0)
MCV: 89.9 fL (ref 80.0–100.0)
Monocytes Absolute: 0.6 10*3/uL (ref 0.1–1.0)
Monocytes Relative: 19 %
Neutro Abs: 1.7 10*3/uL (ref 1.7–7.7)
Neutrophils Relative %: 52 %
Platelets: 124 10*3/uL — ABNORMAL LOW (ref 150–400)
RBC: 3.78 MIL/uL — ABNORMAL LOW (ref 4.22–5.81)
RDW: 14.9 % (ref 11.5–15.5)
WBC: 3.2 10*3/uL — ABNORMAL LOW (ref 4.0–10.5)
nRBC: 0 % (ref 0.0–0.2)

## 2022-03-29 LAB — ZINC: Zinc: 68 ug/dL (ref 44–115)

## 2022-03-29 LAB — COPPER, SERUM: Copper: 141 ug/dL — ABNORMAL HIGH (ref 69–132)

## 2022-03-29 LAB — LACTATE DEHYDROGENASE: LDH: 508 U/L — ABNORMAL HIGH (ref 98–192)

## 2022-03-29 MED ORDER — IOHEXOL 300 MG/ML  SOLN
100.0000 mL | Freq: Once | INTRAMUSCULAR | Status: AC | PRN
  Administered 2022-03-29: 100 mL via INTRAVENOUS

## 2022-03-29 NOTE — Progress Notes (Addendum)
? ?PROGRESS NOTE ? ? ? ?Austin HARAN  Oliver:[id number] DOB: 1956-05-16 DOA: 03/25/2022 ?PCP: [name], MD ? ? ?Brief Narrative: ?Austin Oliver is a 66 y.o. male with medical history significant of recurrent strokes, CAD, COPD, sick sinus syndrome s/p pacemaker placement, cirrhosis with recent admission for stroke with right lower extremity weakness with concern for possible demyelination disease. ? ?Assessment and Plan: ?Stroke-like symptoms ?Initial concern for stroke but neurology more concerned for demyelenating disease. Initial MRI brain correlates with less likely stroke and more likely demyelinating disease. Neurology recommendation for MRI w/wo contrast of brain, cervical/thoracic/lumbar spine. PT recommending inpatient rehabilitation. 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 MRI thoracic/lumbar spine significant for abnormal cord signal at T3 and T4 levels suggestive of cord ischemia vs transverse myelitis. ?-Await neurology recomendations ? ?Recurrent falls ?In setting of recent stroke and concern for demyelinating disease. PT/OT ordered and recommend inpatient rehabiliation. ? ?History of CVA (cerebrovascular accident) ?Patient is on Aspirin/Plavix, Crestor, Ranexa and Zetia as an outpatient. ?-Continue outpatient regimen. ? ?Acute urinary retention ?Chronic issue. Foley exchanged on admission, 5/9. Urinalysis not suggestive of infection. ?-Continue Foley, finasteride and Flomax ? ?Abnormal bowel habits ?Diarrhea, then constipation ? ?CAD (coronary artery disease) ?Patient is on Aspirin, Plavix, Ranexa, Crestor as an outpatient. No current chest pain. Direct LDL of 16.9. ? ?Sick sinus syndrome ([place]) ?S/p pacemaker, [place] MR conditional pacemaker ? ?Essential hypertension ?Patient is not on medication management as an outpatient. ? ?Type 2 diabetes mellitus ([place]) ?Metformin discontinued at prior hospitalization secondary to  diarrhea. Most recent hemoglobin A1C of 5.4%. Well controlled. ? ?COPD (chronic obstructive pulmonary disease) ([place]) ?Currently asymptomatic.  ? ?Cirrhosis ([place]) ?Noted on prior abdominal ultrasound. ? 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 No clinical features currently concerning for hyperfunctioning lesion at this time.  ? ?MRI thoracic/lumbar spine significant for T1 marrow lesion concerning for possible malignancy. Borderline rertoperitoneal lymph nodes noted. Discussed with medical oncology for recommendations on possible workup. Obtain LDH. Discussed with neurology who are concerned patient has metastatic disease. ?-CT chest/abd/pelvis ? ? ? ?DVT prophylaxis: Lovenox ?Code Status:   Code Status: Full Code ?Family Communication: None at bedside ?Disposition Plan: Discharge to inpatient rehabilitation pending bed availability and neurology workup ? ? ?Consultants:  ?Neurology ? ?Procedures:  ?None ? ?Antimicrobials: ?None  ? ? ?Subjective: ?No issues overnight except he did not sleep as well. ? ?Objective: ?BP 108/66 (BP Location: Left Arm)   Pulse 67   Temp 97.6 ?F (36.4 ?C) (Oral)   Resp 18   Ht '5\' 9"'$  (1.753 m)   Wt 95.3 kg   SpO2 96%   BMI 31.01 kg/m?  ? ?Examination: ? ?General exam: Appears calm and comfortable ?Respiratory system: Clear to auscultation. Respiratory effort normal. ?Cardiovascular system: S1 & S2 heard, RRR. No murmurs. ?Gastrointestinal system: Abdomen is nondistended, soft and nontender. Normal bowel sounds heard. ?Central nervous system: Alert and oriented. RLE with ability to move toes slightly, otherwise 0/5 strength ?Musculoskeletal: No edema. No calf tenderness ?Skin: No cyanosis. No rashes ?Psychiatry: Judgement and insight appear normal. Mood & affect appropriate.  ? ? ?Data Reviewed: I have personally reviewed following labs and imaging studies ? ?CBC ?Lab Results  ?Component  Value Date  ? WBC 3.2 (L) 03/29/2022  ? RBC 3.78 (L) 03/29/2022  ? HGB 11.0 (L) 03/29/2022  ? HCT 34.0 (L) 03/29/2022  ? MCV  89.9 03/29/2022  ? MCH 29.1 03/29/2022  ? PLT 124 (L) 03/29/2022  ? MCHC 32.4 03/29/2022  ? RDW 14.9 03/29/2022  ? LYMPHSABS 0.8 03/29/2022  ? MONOABS 0.6 03/29/2022  ? EOSABS 0.1 03/29/2022  ? BASOSABS 0.0 03/29/2022  ? ? ? ?Last metabolic panel ?Lab Results  ?Component Value Date  ? NA 136 03/25/2022  ? K 4.2 03/25/2022  ? CL 105 03/25/2022  ? CO2 22 03/25/2022  ? BUN 25 (H) 03/25/2022  ? CREATININE 1.24 03/25/2022  ? GLUCOSE 86 03/25/2022  ? GFRNONAA >60 03/25/2022  ? GFRAA 61 11/30/2020  ? CALCIUM 9.0 03/25/2022  ? PROT 7.1 03/25/2022  ? ALBUMIN 3.5 03/25/2022  ? BILITOT 1.6 (H) 03/25/2022  ? ALKPHOS 71 03/25/2022  ? AST 23 03/25/2022  ? ALT 20 03/25/2022  ? ANIONGAP 11 03/25/2022  ? ? ?GFR: ?Estimated Creatinine Clearance: 65.8 mL/min (by C-G formula based on SCr of 1.24 mg/dL). ? ?Recent Results (from the past 240 hour(s))  ?Resp Panel by RT-PCR (Flu A&B, Covid) Nasopharyngeal Swab     Status: None  ? Collection Time: 03/25/22  2:48 PM  ? Specimen: Nasopharyngeal Swab; Nasopharyngeal(NP) swabs in vial transport medium  ?Result Value Ref Range Status  ? SARS Coronavirus 2 by RT PCR NEGATIVE NEGATIVE Final  ?  Comment: (NOTE) ?SARS-CoV-2 target nucleic acids are NOT DETECTED. ? ?The SARS-CoV-2 RNA is generally detectable in upper respiratory ?specimens during the acute phase of infection. The lowest ?concentration of SARS-CoV-2 viral copies this assay can detect is ?138 copies/mL. A negative result does not preclude SARS-Cov-2 ?infection and should not be used as the sole basis for treatment or ?other patient management decisions. A negative result may occur with  ?improper specimen collection/handling, submission of specimen other ?than nasopharyngeal swab, presence of viral mutation(s) within the ?areas targeted by this assay, and inadequate number of viral ?copies(<138 copies/mL). A  negative result must be combined with ?clinical observations, patient history, and epidemiological ?information. The expected result is Negative. ? ?Fact Sheet for Patients:  ?EntrepreneurPulse.com.au ? ?Fact Sheet for Healthcare Providers:  ?IncredibleEmployment.be ? ?This test is no t yet approved or cleared by the [place] FDA and  ?has been authorized for detection and/or diagnosis of SARS-CoV-2 by ?FDA under an Emergency Use Authorization (EUA). This EUA will remain  ?in effect (meaning this test can be used) for the duration of the ?COVID-19 declaration under Section 564(b)(1) of the Act, 21 ?U.S.C.section 360bbb-3(b)(1), unless the authorization is terminated  ?or revoked sooner.  ? ? ?  ? Influenza A by PCR NEGATIVE NEGATIVE Final  ? Influenza B by PCR NEGATIVE NEGATIVE Final  ?  Comment: (NOTE) ?The Xpert Xpress SARS-CoV-2/FLU/RSV plus assay is intended as an aid ?in the diagnosis of influenza from Nasopharyngeal swab specimens and ?should not be used as a sole basis for treatment. Nasal washings and ?aspirates are unacceptable for Xpert Xpress SARS-CoV-2/FLU/RSV ?testing. ? ?Fact Sheet for Patients: ?EntrepreneurPulse.com.au ? ?Fact Sheet for Healthcare Providers: ?IncredibleEmployment.be ? ?This test is not yet approved or cleared by the [place] FDA and ?has been authorized for detection and/or diagnosis of SARS-CoV-2 by ?FDA under an Emergency Use Authorization (EUA). This EUA will remain ?in effect (meaning this test can be used) for the duration of the ?COVID-19 declaration under Section 564(b)(1) of the Act, 21 U.S.C. ?section 360bbb-3(b)(1), unless the authorization is terminated or ?revoked. ? ?Performed at [facility], [place] [address] [place], [place] ?[address] ?  ?Urine Culture  Status: Abnormal  ? Collection Time: 03/25/22  3:53 PM  ? Specimen: Urine, Catheterized  ?Result Value Ref Range Status  ?  Specimen Description URINE, CATHETERIZED  Final  ? Special Requests   Final  ?  NONE ?Performed at [facility], [place] [address], [place], [place] [address] ?  ? Culture 10,000 COLONIES/mL STAPHYLOCOCCUS EPIDERMIDIS

## 2022-03-29 NOTE — Progress Notes (Signed)
Neurology Progress Note ? ? ?S:// ?Strength exam unchanged, still unable to move RLE. No new neurologic complaints today.' ? ?MRI brain wwo contrast personally reviewed and showed multifocal chronic infarcts. Radiology suggested appearance might be consistent with demyelinating disease however these lesions showed restricted diffusion on prior admissions c/w acute multifocal ischemic infarcts.   ? ?Personal review of the MRI C-spine with and without contrast revealed no etiology for patient's worsening right greater than left lower extremity weakness.   ? ?MRI t and l spine wwo contrast (personally reviewed; I agree with below interpretation) ? ?1. Abnormal cord signal intensity at the T3 and T4 levels. Findings ?suggest cord ischemia or other process such as transverse myelitis. ?No enhancement to suggest neoplastic process. ?2. There are several areas of abnormal T1 marrow signal and contrast ?enhancement worrisome for metastatic disease or lymphoma. Patient ?may need metastatic workup. There are also bilateral adrenal gland ?lesions and borderline retroperitoneal lymph nodes. ? ? ?O:// ?Current vital signs: ?BP 108/66 (BP Location: Left Arm)   Pulse 73   Temp 99 ?F (37.2 ?C) (Oral)   Resp 18   Ht '5\' 9"'$  (1.753 m)   Wt 95.3 kg   SpO2 95%   BMI 31.01 kg/m?  ?Vital signs in last 24 hours: ?Temp:  [97.6 ?F (36.4 ?C)-99 ?F (37.2 ?C)] 99 ?F (37.2 ?C) (05/13 1557) ?Pulse Rate:  [59-73] 73 (05/13 1557) ?Resp:  [15-18] 18 (05/13 0446) ?BP: (108-112)/(61-66) 108/66 (05/13 0941) ?SpO2:  [95 %-98 %] 95 % (05/13 1557) ? ?GENERAL: Awake, alert in NAD ?HEENT: - Normocephalic and atraumatic, dry mm ?LUNGS - Clear to auscultation bilaterally with no wheezes ?CV - S1S2 RRR, no m/r/g, equal pulses bilaterally. ?ABDOMEN - Soft, nontender, nondistended with normoactive BS ?Ext: warm, well perfused, intact peripheral pulses, no  edema ?NEURO:  ?Mental Status: AA&Ox4 ?Language: speech is clear.  Naming, repetition, fluency, and  comprehension intact. ?Cranial Nerves: PERRL 3 mm/brisk. EOMI, visual fields full, no facial asymmetry, facial sensation intact, hearing intact, tongue/uvula/soft palate midline, normal sternocleidomastoid and trapezius muscle strength. No evidence of tongue atrophy or fibrillations ?Motor: bilateral uppers 5/5, left lower 4/5, right lower 0/5  ?Reflexes: 3+ patellar, achilles left 3+, right 0 ?Tone: is normal and bulk is normal ?Sensation- Intact to light touch bilaterally ?Coordination: FTN intact bilaterally ?Gait- deferred ? ? ?Medications ? ?Current Facility-Administered Medications:  ?  acetaminophen (TYLENOL) tablet 650 mg, 650 mg, Oral, Q4H PRN, 650 mg at 03/29/22 1451 **OR** acetaminophen (TYLENOL) 160 MG/5ML solution 650 mg, 650 mg, Per Tube, Q4H PRN **OR** acetaminophen (TYLENOL) suppository 650 mg, 650 mg, Rectal, Q4H PRN, Elodia Florence., MD ?  aspirin EC tablet 81 mg, 81 mg, Oral, QPM, Mariel Aloe, MD, 81 mg at 03/29/22 1807 ?  Chlorhexidine Gluconate Cloth 2 % PADS 6 each, 6 each, Topical, Daily, Mariel Aloe, MD, 6 each at 03/29/22 0934 ?  clopidogrel (PLAVIX) tablet 75 mg, 75 mg, Oral, Daily, Mariel Aloe, MD, 75 mg at 03/29/22 0934 ?  enoxaparin (LOVENOX) injection 40 mg, 40 mg, Subcutaneous, Q24H, Elodia Florence., MD, 40 mg at 03/29/22 1807 ?  ezetimibe (ZETIA) tablet 10 mg, 10 mg, Oral, q AM, Mariel Aloe, MD, 10 mg at 03/29/22 0631 ?  feeding supplement (GLUCERNA SHAKE) (GLUCERNA SHAKE) liquid 237 mL, 237 mL, Oral, BID BM, Mariel Aloe, MD, 237 mL at 03/29/22 1424 ?  finasteride (PROSCAR) tablet 5 mg, 5 mg, Oral, QHS, [place], Evalee Jefferson, MD, 5 mg at  03/28/22 2214 ?  gadobutrol (GADAVIST) 1 MMOL/ML injection 10 mL, 10 mL, Intravenous, Once PRN, Donnetta Simpers, MD ?  levothyroxine (SYNTHROID) tablet 112 mcg, 112 mcg, Oral, QAC breakfast, Mariel Aloe, MD, 112 mcg at 03/29/22 0631 ?  multivitamin with minerals tablet 1 tablet, 1 tablet, Oral, Daily, Mariel Aloe, MD, 1 tablet at 03/29/22 7793 ?  oxyCODONE (Oxy IR/ROXICODONE) immediate release tablet 2.5-5 mg, 2.5-5 mg, Oral, Q6H PRN, Mariel Aloe, MD, 5 mg at 03/29/22 1807 ?  ranolazine (RANEXA) 12 hr tablet 1,000 mg, 1,000 mg, Oral, BID, Mariel Aloe, MD, 1,000 mg at 03/29/22 0934 ?  senna-docusate (Senokot-S) tablet 1 tablet, 1 tablet, Oral, QHS PRN, Elodia Florence., MD ?  tamsulosin [facility]) capsule 0.4 mg, 0.4 mg, Oral, Q supper, Mariel Aloe, MD, 0.4 mg at 03/29/22 1807 ? ?Labs ?I have reviewed Labs in Epic and the results pertinent to this consultation are:  ?LDL- not calculated as Triglycerides were 271. (Allergy to statins and on Zetia) ?Vit B12- 636 ?Ceruloplasmin-28.7 ?Folate-7.5 ?Zinc WNL 68 ?Copper 141 ? ? ? ?Assessment:  ?66 y.o. 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 Of note, he had urinary retention at the time of hospitalization in early may.Since discharge he has had recurrent falls and progressive weakness of RLE. He does report falling in the shower prior to admission and hurting his right knee  ? ?MRI brain wwo contrast personally reviewed and showed multifocal chronic infarcts. Radiology suggested appearance might be consistent with demyelinating disease however these lesions showed restricted diffusion on prior admissions c/w acute multifocal ischemic infarcts.  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  ? ?On MRI lumbar spine he has multiple contrast enhancing vertebral lesions c/f lymphoma or other malignancy. Based on the number of multifocal ischemic embolic strokes he has had over the past 5 mos, I am v concerned that he is hypercoagulable 2/2 underlying malignancy. Recommend CT c/a/p with contrast malignancy screen and consult to medical oncology. Depending on results of that may consider LP to further characterize thoracic cord lesion although it does not contrast enhance and is therefore not favored to represent malignancy. Spinal cord infarct is favored, although transverse myelitis is a possibility (does not always contrast enhance). His strength exam is stable. ? ? ?Recommendations: ?- CT c/a/p malignancy screen and consult to medical oncology ?- Will continue to follow ? ?Su Monks, MD ?[facility] ?[phone] ? ?If 7pm- 7am, please page neurology on call as listed in [place]. ? ?

## 2022-03-30 DIAGNOSIS — R338 Other retention of urine: Secondary | ICD-10-CM | POA: Diagnosis not present

## 2022-03-30 DIAGNOSIS — I10 Essential (primary) hypertension: Secondary | ICD-10-CM | POA: Diagnosis not present

## 2022-03-30 DIAGNOSIS — R531 Weakness: Secondary | ICD-10-CM | POA: Diagnosis not present

## 2022-03-30 DIAGNOSIS — R296 Repeated falls: Secondary | ICD-10-CM | POA: Diagnosis not present

## 2022-03-30 DIAGNOSIS — M87051 Idiopathic aseptic necrosis of right femur: Secondary | ICD-10-CM | POA: Diagnosis present

## 2022-03-30 DIAGNOSIS — I714 Abdominal aortic aneurysm, without rupture, unspecified: Secondary | ICD-10-CM

## 2022-03-30 DIAGNOSIS — R160 Hepatomegaly, not elsewhere classified: Secondary | ICD-10-CM | POA: Diagnosis present

## 2022-03-30 DIAGNOSIS — Z8673 Personal history of transient ischemic attack (TIA), and cerebral infarction without residual deficits: Secondary | ICD-10-CM | POA: Diagnosis not present

## 2022-03-30 LAB — CBC WITH DIFFERENTIAL/PLATELET
Abs Immature Granulocytes: 0.02 10*3/uL (ref 0.00–0.07)
Basophils Absolute: 0 10*3/uL (ref 0.0–0.1)
Basophils Relative: 1 %
Eosinophils Absolute: 0.1 10*3/uL (ref 0.0–0.5)
Eosinophils Relative: 2 %
HCT: 33.9 % — ABNORMAL LOW (ref 39.0–52.0)
Hemoglobin: 11.3 g/dL — ABNORMAL LOW (ref 13.0–17.0)
Immature Granulocytes: 1 %
Lymphocytes Relative: 26 %
Lymphs Abs: 0.8 10*3/uL (ref 0.7–4.0)
MCH: 29.4 pg (ref 26.0–34.0)
MCHC: 33.3 g/dL (ref 30.0–36.0)
MCV: 88.3 fL (ref 80.0–100.0)
Monocytes Absolute: 0.6 10*3/uL (ref 0.1–1.0)
Monocytes Relative: 19 %
Neutro Abs: 1.6 10*3/uL — ABNORMAL LOW (ref 1.7–7.7)
Neutrophils Relative %: 51 %
Platelets: 130 10*3/uL — ABNORMAL LOW (ref 150–400)
RBC: 3.84 MIL/uL — ABNORMAL LOW (ref 4.22–5.81)
RDW: 15.1 % (ref 11.5–15.5)
WBC: 3.1 10*3/uL — ABNORMAL LOW (ref 4.0–10.5)
nRBC: 0 % (ref 0.0–0.2)

## 2022-03-30 LAB — BASIC METABOLIC PANEL
Anion gap: 9 (ref 5–15)
BUN: 23 mg/dL (ref 8–23)
CO2: 26 mmol/L (ref 22–32)
Calcium: 8.8 mg/dL — ABNORMAL LOW (ref 8.9–10.3)
Chloride: 100 mmol/L (ref 98–111)
Creatinine, Ser: 1.24 mg/dL (ref 0.61–1.24)
GFR, Estimated: >60 mL/min (ref >60)
Glucose, Bld: 125 mg/dL — ABNORMAL HIGH (ref 70–99)
Potassium: 4.5 mmol/L (ref 3.5–5.1)
Sodium: 135 mmol/L (ref 135–145)

## 2022-03-30 MED ORDER — REVEFENACIN 175 MCG/3ML IN SOLN
175.0000 ug | Freq: Every day | RESPIRATORY_TRACT | Status: DC
  Administered 2022-03-30 – 2022-04-04 (×6): 175 ug via RESPIRATORY_TRACT
  Filled 2022-03-30 (×6): qty 3

## 2022-03-30 MED ORDER — TIOTROPIUM BROMIDE MONOHYDRATE 2.5 MCG/ACT IN AERS: 2.0000 | INHALATION_SPRAY | Freq: Every day | RESPIRATORY_TRACT | Status: DC

## 2022-03-30 MED ORDER — IPRATROPIUM-ALBUTEROL 0.5-2.5 (3) MG/3ML IN SOLN: 3.0000 mL | RESPIRATORY_TRACT | Status: DC | PRN

## 2022-03-30 NOTE — Assessment & Plan Note (Deleted)
Liver cirrhosis Concern for metastatic cancer.  CT chest/abd/pelvis with masses of the liver, masses of the adrenal glands, abdominal lymphadenopathy. There is also concern for cirrhosis of the liver based on [place] in 2022. -IR consult for biopsy (Delayed secondary to Aspirin/Plavix). Plan for biopsy on 5/19.

## 2022-03-30 NOTE — Progress Notes (Signed)
? ?PROGRESS NOTE ? ? ? ?Austin Oliver  OFB:[id number] DOB: [birth year] DOA: 03/25/2022 ?PCP: [name], MD ? ? ?Brief Narrative: ?Austin Oliver is a 66 y.o. male with medical history significant of recurrent strokes, CAD, COPD, sick sinus syndrome s/p pacemaker placement, cirrhosis with recent admission for stroke with right lower extremity weakness with concern for possible demyelination disease. ? ?Assessment and Plan: ?Stroke-like symptoms ?Initial concern for stroke but neurology more concerned for demyelenating disease. Initial MRI brain correlates with less likely stroke and more likely demyelinating disease. Neurology recommendation for MRI w/wo contrast of brain, cervical/thoracic/lumbar spine. PT recommending inpatient rehabilitation. 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 MRI thoracic/lumbar spine significant for abnormal cord signal at T3 and T4 levels suggestive of cord ischemia vs transverse myelitis. 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 ? ?Recurrent falls ?In setting of recent stroke and concern for demyelinating disease. PT/OT ordered and recommend inpatient rehabiliation. ? ?History of CVA (cerebrovascular accident) ?Patient is on Aspirin/Plavix, Crestor, Ranexa and Zetia as an outpatient. Regimen continued on admission. Plavix recommended to be held by IR for biopsy. ?-Continue Zetia and Ranexa ? ?Acute urinary retention ?Chronic issue. Foley exchanged on admission, 5/9. Urinalysis not suggestive of infection. ?-Continue Foley, finasteride and Flomax ? ?Abnormal bowel habits ?Diarrhea, then constipation ? ?CAD (coronary artery disease) ?Patient is on Aspirin, Plavix, Ranexa, Crestor as an outpatient. No current chest pain. Direct LDL of 16.9. ? ?Sick sinus syndrome ([place]) ?S/p pacemaker, [place] MR conditional pacemaker ? ?Essential hypertension ?Patient is not on medication management  as an outpatient. ? ?Type 2 diabetes mellitus ([place]) ?Metformin discontinued at prior hospitalization secondary to diarrhea. Most recent hemoglobin A1C of 5.4%. Well controlled. ? ?COPD (chronic obstructive pulmonary disease) ([place]) ?Currently asymptomatic.  ? ?Cirrhosis ([place]) ?Noted on prior abdominal ultrasound. ? ?Avascular necrosis of femoral head, right ([place]) ?Noted incidentally on CT imaging. No subchondral collapse. Asymptomatic. ? ?AAA (abdominal aortic aneurysm) ([place]) ?Infrarenal. History of smoking. Currently measuring 3.3 cm. Recommendation for follow-up every 3 years. ? ?Liver masses ?Concern for metastatic cancer. CT chest/abd/pelvis with masses of the liver, masses of the adrenal glands, abdominal lymphadenopathy. ?-IR consult for biopsy (complicated by Plavix) ? 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 No clinical features currently concerning for hyperfunctioning lesion at this time.  ? ?MRI thoracic/lumbar spine significant for T1 marrow lesion concerning for possible malignancy. Borderline rertoperitoneal lymph nodes noted. Discussed with medical oncology for recommendations on possible workup. Obtain LDH. Discussed with neurology who are concerned patient has metastatic disease and suffered a stroke in his spinal cord. ? ? ? ?DVT prophylaxis: Lovenox ?Code Status:   Code Status: Full Code ?Family Communication: Wife at bedside ?Disposition Plan: Discharge to inpatient rehabilitation pending bed availability ? ? ?Consultants:  ?Neurology ? ?Procedures:  ?None ? ?Antimicrobials: ?None  ? ? ?Subjective: ?Did not sleep well last night. No other issues overnight. ? ?Objective: ?BP 100/61 (BP Location: Left Arm)   Pulse 80   Temp 98.9 ?F (37.2 ?C) (Oral)   Resp 18   Ht '5\' 9"'$  (1.753 m)   Wt 95.3 kg   SpO2 95%   BMI 31.01 kg/m?  ? ?Examination: ? ?General exam: Appears calm and comfortable ?Respiratory  system: Clear to auscultation. Respiratory effort normal. ?Cardiovascular system: S1 & S2 heard, RRR. No murmurs. ?Gastrointestinal system: Abdomen is nondistended, soft and nontender. Normal  bowel sounds heard. ?Central nervous system: Alert and oriented. RLE still with mostly 0/5 strength except for ability to wiggle some toes ?Musculoskeletal: No edema. No calf tenderness ?Skin: No cyanosis. No rashes ?Psychiatry: Judgement and insight appear normal. Mood & affect appropriate.  ? ? ?Data Reviewed: I have personally reviewed following labs and imaging studies ? ?CBC ?Lab Results  ?Component Value Date  ? WBC 3.1 (L) 03/30/2022  ? RBC 3.84 (L) 03/30/2022  ? HGB 11.3 (L) 03/30/2022  ? HCT 33.9 (L) 03/30/2022  ? MCV 88.3 03/30/2022  ? MCH 29.4 03/30/2022  ? PLT 130 (L) 03/30/2022  ? MCHC 33.3 03/30/2022  ? RDW 15.1 03/30/2022  ? LYMPHSABS 0.8 03/30/2022  ? MONOABS 0.6 03/30/2022  ? EOSABS 0.1 03/30/2022  ? BASOSABS 0.0 03/30/2022  ? ? ? ?Last metabolic panel ?Lab Results  ?Component Value Date  ? NA 135 03/30/2022  ? K 4.5 03/30/2022  ? CL 100 03/30/2022  ? CO2 26 03/30/2022  ? BUN 23 03/30/2022  ? CREATININE 1.24 03/30/2022  ? GLUCOSE 125 (H) 03/30/2022  ? GFRNONAA >60 03/30/2022  ? GFRAA 61 11/30/2020  ? CALCIUM 8.8 (L) 03/30/2022  ? PROT 7.1 03/25/2022  ? ALBUMIN 3.5 03/25/2022  ? BILITOT 1.6 (H) 03/25/2022  ? ALKPHOS 71 03/25/2022  ? AST 23 03/25/2022  ? ALT 20 03/25/2022  ? ANIONGAP 9 03/30/2022  ? ? ?GFR: ?Estimated Creatinine Clearance: 65.8 mL/min (by C-G formula based on SCr of 1.24 mg/dL). ? ?Recent Results (from the past 240 hour(s))  ?Resp Panel by RT-PCR (Flu A&B, Covid) Nasopharyngeal Swab     Status: None  ? Collection Time: 03/25/22  2:48 PM  ? Specimen: Nasopharyngeal Swab; Nasopharyngeal(NP) swabs in vial transport medium  ?Result Value Ref Range Status  ? SARS Coronavirus 2 by RT PCR NEGATIVE NEGATIVE Final  ?  Comment: (NOTE) ?SARS-CoV-2 target nucleic acids are NOT DETECTED. ? ?The SARS-CoV-2 RNA  is generally detectable in upper respiratory ?specimens during the acute phase of infection. The lowest ?concentration of SARS-CoV-2 viral copies this assay can detect is ?138 copies/mL. A negative result does not preclude SARS-Cov-2 ?infection and should not be used as the sole basis for treatment or ?other patient management decisions. A negative result may occur with  ?improper specimen collection/handling, submission of specimen other ?than nasopharyngeal swab, presence of viral mutation(s) within the ?areas targeted by this assay, and inadequate number of viral ?copies(<138 copies/mL). A negative result must be combined with ?clinical observations, patient history, and epidemiological ?information. The expected result is Negative. ? ?Fact Sheet for Patients:  ?EntrepreneurPulse.com.au ? ?Fact Sheet for Healthcare Providers:  ?IncredibleEmployment.be ? ?This test is no t yet approved or cleared by the [place] FDA and  ?has been authorized for detection and/or diagnosis of SARS-CoV-2 by ?FDA under an Emergency Use Authorization (EUA). This EUA will remain  ?in effect (meaning this test can be used) for the duration of the ?COVID-19 declaration under Section 564(b)(1) of the Act, 21 ?U.S.C.section 360bbb-3(b)(1), unless the authorization is terminated  ?or revoked sooner.  ? ? ?  ? Influenza A by PCR NEGATIVE NEGATIVE Final  ? Influenza B by PCR NEGATIVE NEGATIVE Final  ?  Comment: (NOTE) ?The Xpert Xpress SARS-CoV-2/FLU/RSV plus assay is intended as an aid ?in the diagnosis of influenza from Nasopharyngeal swab specimens and ?should not be used as a sole basis for treatment. Nasal washings and ?aspirates are unacceptable for Xpert Xpress SARS-CoV-2/FLU/RSV ?testing. ? ?Fact Sheet for Patients: ?EntrepreneurPulse.com.au ? ?Fact  Sheet for Healthcare Providers: ?IncredibleEmployment.be ? ?This test is not yet approved or cleared by the  [place] FDA and ?has been authorized for detection and/or diagnosis of SARS-CoV-2 by ?FDA under an Emergency Use Authorization (EUA). This EUA will remain ?in effect (meaning this test can be used) for the dura

## 2022-03-30 NOTE — Assessment & Plan Note (Signed)
Infrarenal. History of smoking. Currently measuring 3.3 cm. Recommendation for follow-up every 3 years. ?

## 2022-03-30 NOTE — Consult Note (Signed)
? ? ? ?Chief Complaint: ?Liver lesion ? ?Referring Physician(s): ?Nettey  ? ?Supervising Physician: Jacqulynn Cadet ? ?Patient Status: [facility] - In-pt ? ?History of Present Illness: ?Austin Oliver is a 66 y.o. male with medical including recurrent strokes, CAD, COPD, sick sinus syndrome s/p pacemaker placement, cirrhosis, and recent admission for stroke with right lower extremity weakness with concern for possible demyelination disease. ? ?Workup for demyelinating disease included MRI which showed= ?Several areas of abnormal T1 marrow signal and contrast ?enhancement worrisome for metastatic disease or lymphoma.  ? ?This prompted CT scan chest/abd/pelvis which showed= ?Numerous hypodensities throughout the liver consistent with ?metastatic disease. ? ?We are asked to perform an image guided biopsy. ? ?He is on Aspirin and Plavix and his last dose was this morning. ? ?Per SIRS anticoagulation guidelines, aspirin and Plavix should be held x 5 days prior to organ puncture. ? ?He is unable to move his RLE, his LLE is very weak. No nausea/vomiting. No Fever/chills. ROS negative. ? ? ?Past Medical History:  ?Diagnosis Date  ? Cirrhosis of liver ([place])   ? COPD (chronic obstructive pulmonary disease) ([place])   ? Coronary artery calcification seen on CAT scan 01/14/2017  ? Essential hypertension 09/11/2020  ? GERD (gastroesophageal reflux disease)   ? History of kidney stones   ? Hyperlipidemia   ? Hypothyroidism   ? OSA on CPAP   ? Mild with AHI 7.8/hr >>on CPAP  ? Osteoarthritis   ? Presence of permanent cardiac pacemaker   ? Shingles 2012  ? ? ?Past Surgical History:  ?Procedure Laterality Date  ? CORONARY STENT INTERVENTION N/A 08/24/2020  ? Procedure: CORONARY STENT INTERVENTION;  Surgeon: [name], MD;  Location: [place] [facility];  Service: Cardiovascular;  Laterality: N/A;  ? HEEL SPUR SURGERY Left 80's  ? INTRAVASCULAR PRESSURE WIRE/FFR STUDY N/A 04/25/2021  ? Procedure: INTRAVASCULAR PRESSURE WIRE/FFR STUDY;   Surgeon: [name], MD;  Location: [place] [facility];  Service: Cardiovascular;  Laterality: N/A;  ? INTRAVASCULAR ULTRASOUND/IVUS N/A 08/24/2020  ? Procedure: Intravascular Ultrasound/IVUS;  Surgeon: [name], MD;  Location: [place] [facility];  Service: Cardiovascular;  Laterality: N/A;  ? KNEE ARTHROPLASTY Right 10/31/2021  ? Procedure: COMPUTER ASSISTED TOTAL KNEE ARTHROPLASTY;  Surgeon: Rod Can, MD;  Location: [facility];  Service: Orthopedics;  Laterality: Right;  ? KNEE SURGERY Bilateral   ? numerous times  ? LEFT HEART CATH AND CORONARY ANGIOGRAPHY N/A 08/24/2020  ? Procedure: LEFT HEART CATH AND CORONARY ANGIOGRAPHY;  Surgeon: [name], MD;  Location: [place] [facility];  Service: Cardiovascular;  Laterality: N/A;  ? LEFT HEART CATH AND CORONARY ANGIOGRAPHY N/A 04/25/2021  ? Procedure: LEFT HEART CATH AND CORONARY ANGIOGRAPHY;  Surgeon: [name], MD;  Location: [place] [facility];  Service: Cardiovascular;  Laterality: N/A;  ? NECK SURGERY  70's  ? PACEMAKER IMPLANT N/A 12/04/2020  ? Procedure: PACEMAKER IMPLANT;  Surgeon: [name], MD;  Location: [place] [facility];  Service: Cardiovascular;  Laterality: N/A;  ? TOTAL HIP ARTHROPLASTY Left 03/09/2017  ? Procedure: LEFT TOTAL HIP ARTHROPLASTY ANTERIOR APPROACH;  Surgeon: Rod Can, MD;  Location: [place];  Service: Orthopedics;  Laterality: Left;  Dr. requesting RNFA  ? ? ?Allergies: ?Bee venom, Cleocin [clindamycin hcl], and Statins ? ?Medications: ?Prior to Admission medications   ?Medication Sig Start Date End Date Taking? Authorizing Provider  ?acetaminophen (TYLENOL) 325 MG tablet Take 1-2 tablets (325-650 mg total) by mouth every 6 (six) hours as needed for mild  pain (pain score 1-3 or temp > 100.5). 11/01/21  Yes Dorothyann Peng, PA-C  ?aspirin EC 81 MG tablet Take 81 mg by mouth every evening. Swallow whole.   Yes [provider]  ?cholecalciferol (VITAMIN D3) 25 MCG (1000 UNIT) tablet Take  1,000 Units by mouth daily.   Yes [provider]  ?clopidogrel (PLAVIX) 75 MG tablet Take 1 tablet (75 mg total) by mouth daily. 12/27/21  Yes Chandrasekhar, Mahesh A, MD  ?ezetimibe (ZETIA) 10 MG tablet Take 10 mg by mouth in the morning.   Yes [provider]  ?finasteride (PROSCAR) 5 MG tablet Take 5 mg by mouth at bedtime. 06/13/21  Yes [provider]  ?gabapentin (NEURONTIN) 100 MG capsule Take 200 mg by mouth at bedtime.   Yes [provider]  ?levothyroxine (SYNTHROID) 112 MCG tablet Take 112 mcg by mouth daily before breakfast.   Yes [provider]  ?nitroGLYCERIN (NITROSTAT) 0.4 MG SL tablet Place 1 tablet (0.4 mg total) under the tongue every 5 (five) minutes x 3 doses as needed for chest pain. 08/27/20  Yes Chandrasekhar, Mahesh A, MD  ?pantoprazole (PROTONIX) 40 MG tablet Take 40 mg by mouth daily after supper.    Yes [provider]  ?ranolazine (RANEXA) 1000 MG SR tablet TAKE 1  BY MOUTH TWICE DAILY ?Patient taking differently: Take 1,000 mg by mouth 2 (two) times daily. 10/09/21  Yes Chandrasekhar, Mahesh A, MD  ?rosuvastatin (CRESTOR) 5 MG tablet Take 1 tablet (5 mg total) by mouth daily. 12/27/21  Yes Chandrasekhar, Mahesh A, MD  ?shark liver oil-cocoa butter (PREPARATION H) 0.25-3-85.5 % suppository Place 1 suppository rectally daily as needed for hemorrhoids.   Yes [provider]  ?tamsulosin (FLOMAX) 0.4 MG CAPS capsule Take 0.8 mg by mouth daily with supper. 10/02/20  Yes [provider]  ?Tiotropium Bromide Monohydrate (SPIRIVA RESPIMAT) 2.5 MCG/ACT AERS Inhale 2 puffs into the lungs daily. 01/15/22  Yes Olalere, Adewale A, MD  ?vitamin B-12 (CYANOCOBALAMIN) 1000 MCG tablet Take 1,000 mcg by mouth in the morning.   Yes [provider]  ?albuterol (VENTOLIN HFA) 108 (90 Base) MCG/ACT inhaler Inhale 2 puffs into the lungs every 6 (six) hours as needed for shortness of breath or wheezing. ?Patient not taking: Reported  on 03/25/2022 05/22/21   [provider]  ?blood glucose meter kit and supplies KIT Dispense based on patient and insurance preference. Use up to four times daily as directed. 11/23/21   Florencia Reasons, MD  ?furosemide (LASIX) 20 MG tablet Take 1 tablet (20 mg total) by mouth daily as needed for fluid or edema. ?Patient not taking: Reported on 03/25/2022 03/18/22   Domenic Polite, MD  ?loteprednol (LOTEMAX) 0.5 % ophthalmic suspension Place 1 drop into the left eye daily as needed (if having eye pain from shingles). ?Patient not taking: Reported on 03/25/2022    [provider]  ?psyllium (METAMUCIL) 58.6 % powder Take 1 packet by mouth 3 (three) times daily as needed (constipation). ?Patient not taking: Reported on 03/25/2022    [provider]  ?  ? ?Family History  ?Problem Relation Age of Onset  ? Dementia Mother   ? Heart Problems Father   ? COPD Father   ? Lung cancer Father   ? ? ?Social History  ? ?Socioeconomic History  ? Marital status: Married  ?  Spouse name: [name]  ? Number of children: 2  ? Years of education: Not on file  ? Highest education level: Not  on file  ?Occupational History  ? Occupation: Animator  ?Tobacco Use  ? Smoking status: Former  ?  Packs/day: 2.00  ?  Years: 45.00  ?  Pack years: 90.00  ?  Types: Cigarettes  ?  Quit date: 12/05/2016  ?  Years since quitting: 5.3  ? Smokeless tobacco: Never  ?Vaping Use  ? Vaping Use: Never used  ?Substance and Sexual Activity  ? Alcohol use: No  ? Drug use: No  ? Sexual activity: Not on file  ?Other Topics Concern  ? Not on file  ?Social History Narrative  ? Not on file  ? ?Social Determinants of Health  ? ?Financial Resource Strain: Not on file  ?Food Insecurity: Not on file  ?Transportation Needs: Not on file  ?Physical Activity: Not on file  ?Stress: Not on file  ?Social Connections: Not on file  ? ? ? ?Review of Systems: A 12 point ROS discussed and pertinent positives are indicated in the HPI above.  All other systems are  negative. ? ?Review of Systems ? ?Vital Signs: ?BP 100/61 (BP Location: Left Arm)   Pulse 80   Temp 98.9 ?F (37.2 ?C) (Oral)   Resp 18   Ht 5' 9"  (1.753 m)   Wt 210 lb (95.3 kg)   SpO2 95%   BMI 31.01 kg/m?

## 2022-03-30 NOTE — Assessment & Plan Note (Addendum)
Noted incidentally on CT imaging. No subchondral collapse. Asymptomatic. ?

## 2022-03-30 NOTE — Progress Notes (Signed)
Physical Therapy Treatment ?Patient Details ?Name: Austin Oliver ?MRN: [id number] ?DOB: Jun 26, 1956 ?Today's Date: 03/30/2022 ? ? ?History of Present Illness The pt is a 66 yo male presenting 03/25/22 with recurrent falls and worsening B LE weakenss, R>L. CT suggestive of demylenating process vs microvascular ischemic disease. 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 ? ?  ?PT Comments  ? ? Patient able to progress OOB to recliner via lateral scoot transfer and minA for R LE management. Worked on standing from elevated bed surface also but required maxA+2 to stand. Continues to demonstrate trace muscle activation in R LE. Patient eager to mobilize and appreciative of getting to chair this date. Continue to recommend acute inpatient rehab (AIR) for post-acute therapy needs.  ?   ?Recommendations for follow up therapy are one component of a multi-disciplinary discharge planning process, led by the attending physician.  Recommendations may be updated based on patient status, additional functional criteria and insurance authorization. ? ?Follow Up Recommendations ? Acute inpatient rehab (3hours/day) ?  ?  ?Assistance Recommended at Discharge Frequent or constant Supervision/Assistance  ?Patient can return home with the following Two people to help with walking and/or transfers;A lot of help with bathing/dressing/bathroom;Assistance with cooking/housework;Help with stairs or ramp for entrance;Assist for transportation ?  ?Equipment Recommendations ? Wheelchair cushion (measurements PT);Wheelchair (measurements PT);BSC/3in1;Other (comment) (sliding board)  ?  ?Recommendations for Other Services   ? ? ?  ?Precautions / Restrictions Precautions ?Precautions: Fall ?Restrictions ?Weight Bearing Restrictions: No  ?  ? ?Mobility ? Bed  Mobility ?Overal bed mobility: Needs Assistance ?Bed Mobility: Supine to Sit ?  ?  ?Supine to sit: Min assist, HOB elevated ?  ?  ?General bed mobility comments: minA for R LE management towards EOB. Patient attempting to utilize UEs to bring RLE but unable to reach ?  ? ?Transfers ?Overall transfer level: Needs assistance ?Equipment used: 2 person hand held assist ?Transfers: Sit to/from Stand, Bed to chair/wheelchair/BSC ?Sit to Stand: Max assist, +2 physical assistance, +2 safety/equipment, From elevated surface ?  ?  ?  ?  ? Lateral/Scoot Transfers: Min assist ?General transfer comment: performed sit to stand x 2 with one attempt requiring pericare by 3rd person. Blocking of R knee and facilitation for hip extension as patient in crouched position with knees flexed. Unable to obtain Stedy this session so trialed lateral scooting. Able to lateral scoot to R to drop arm recliner with assist for R LE management. Patient wiht strong UEs to assist with transfer ?  ? ?Ambulation/Gait ?  ?  ?  ?  ?  ?  ?  ?  ? ? ?Stairs ?  ?  ?  ?  ?  ? ? ?Wheelchair Mobility ?  ? ?Modified Rankin (Stroke Patients Only) ?Modified Rankin (Stroke Patients Only) ?Pre-Morbid Rankin Score: Moderate disability ?Modified Rankin: Moderately severe disability ? ? ?  ?Balance Overall balance assessment: History of Falls, Needs assistance ?Sitting-balance support: No upper extremity supported, Feet supported ?Sitting balance-Leahy Scale: Fair ?  ?  ?Standing balance support: Bilateral upper extremity supported, During functional activity ?Standing balance-Leahy Scale: Zero ?Standing balance comment: dependent on +2 moderate assist ?  ?  ?  ?  ?  ?  ?  ?  ?  ?  ?  ?  ? ?  ?Cognition  Arousal/Alertness: Awake/alert ?Behavior During Therapy: [facility] for tasks assessed/performed ?Overall Cognitive Status: Within Functional Limits for tasks assessed ?  ?  ?  ?  ?  ?  ?  ?  ?  ?  ?  ?  ?  ?  ?  ?  ?  ?  ?  ? ?  ?Exercises   ? ?  ?General Comments   ?  ?   ? ?Pertinent Vitals/Pain Pain Assessment ?Pain Assessment: Faces ?Faces Pain Scale: Hurts a little bit ?Pain Location: R knee with movement ?Pain Descriptors / Indicators: Grimacing, Guarding, Moaning ?Pain Intervention(s): Monitored during session  ? ? ?Home Living   ?  ?  ?  ?  ?  ?  ?  ?  ?  ?   ?  ?Prior Function    ?  ?  ?   ? ?PT Goals (current goals can now be found in the care plan section) Acute Rehab PT Goals ?Patient Stated Goal: to get stronger ?PT Goal Formulation: With patient ?Time For Goal Achievement: 04/09/22 ?Potential to Achieve Goals: Good ?Progress towards PT goals: Progressing toward goals ? ?  ?Frequency ? ? ? Min 3X/week ? ? ? ?  ?PT Plan Current plan remains appropriate  ? ? ?Co-evaluation   ?  ?  ?  ?  ? ?  ?AM-PAC PT "6 Clicks" Mobility   ?Outcome Measure ? Help needed turning from your back to your side while in a flat bed without using bedrails?: A Little ?Help needed moving from lying on your back to sitting on the side of a flat bed without using bedrails?: A Little ?Help needed moving to and from a bed to a chair (including a wheelchair)?: A Little ?Help needed standing up from a chair using your arms (e.g., wheelchair or bedside chair)?: Total ?Help needed to walk in hospital room?: Total ?Help needed climbing 3-5 steps with a railing? : Total ?6 Click Score: 12 ? ?  ?End of Session Equipment Utilized During Treatment: Gait belt ?Activity Tolerance: Patient tolerated treatment well ?Patient left: in chair;with call bell/phone within reach;with chair alarm set ?Nurse Communication: Mobility status ?PT Visit Diagnosis: Unsteadiness on feet (R26.81);Muscle weakness (generalized) (M62.81);Difficulty in walking, not elsewhere classified (R26.2);History of falling (Z91.81) ?  ? ? ?Time: 4259-5638 ?PT Time Calculation (min) (ACUTE ONLY): 31 min ? ?Charges:  $Therapeutic Activity: 23-37 mins          ?          ? ?Terin Cragle A. Gilford Rile, PT, DPT ?Acute Rehabilitation Services ?Pager  [phone] ?Office [phone] ? ? ? ?Karcyn Menn A Zahari Fazzino ?03/30/2022, 3:02 PM ? ?

## 2022-03-31 DIAGNOSIS — R338 Other retention of urine: Secondary | ICD-10-CM | POA: Diagnosis not present

## 2022-03-31 DIAGNOSIS — R296 Repeated falls: Secondary | ICD-10-CM | POA: Diagnosis not present

## 2022-03-31 DIAGNOSIS — G9511 Acute infarction of spinal cord (embolic) (nonembolic): Secondary | ICD-10-CM

## 2022-03-31 DIAGNOSIS — Z8673 Personal history of transient ischemic attack (TIA), and cerebral infarction without residual deficits: Secondary | ICD-10-CM | POA: Diagnosis not present

## 2022-03-31 DIAGNOSIS — I10 Essential (primary) hypertension: Secondary | ICD-10-CM | POA: Diagnosis not present

## 2022-03-31 LAB — URINE CULTURE: Culture: 30000 — AB

## 2022-03-31 LAB — PROTIME-INR
INR: 1.2 (ref 0.8–1.2)
Prothrombin Time: 14.6 seconds (ref 11.4–15.2)

## 2022-03-31 LAB — METHYLMALONIC ACID, SERUM: Methylmalonic Acid, Quantitative: 123 nmol/L (ref 0–378)

## 2022-03-31 MED ORDER — PANTOPRAZOLE SODIUM 40 MG PO TBEC
40.0000 mg | DELAYED_RELEASE_TABLET | Freq: Every day | ORAL | Status: DC
  Administered 2022-03-31 – 2022-04-03 (×4): 40 mg via ORAL
  Filled 2022-03-31 (×4): qty 1

## 2022-03-31 NOTE — Progress Notes (Signed)
Neurology Progress Note ? ? ?S:// ?Strength exam unchanged, still unable to move RLE. No new neurologic complaints today. CT c/a/p c/f liver, spleen, and adrenal metastases and diffuse LAD of upper abd and retroperitoneum. Liver biopsy ordered but planned for later this week or early next as patient is on plavix and this needs to be held for 5 days prior. ? ?MRI brain wwo contrast personally reviewed and showed multifocal chronic infarcts. Radiology suggested appearance might be consistent with demyelinating disease however these lesions showed restricted diffusion on prior admissions c/w acute multifocal ischemic infarcts.   ? ?Personal review of the MRI C-spine with and without contrast revealed no etiology for patient's worsening right greater than left lower extremity weakness.   ? ?MRI t and l spine wwo contrast (personally reviewed; I agree with below interpretation) ? ?1. Abnormal cord signal intensity at the T3 and T4 levels. Findings ?suggest cord ischemia or other process such as transverse myelitis. ?No enhancement to suggest neoplastic process. ?2. There are several areas of abnormal T1 marrow signal and contrast ?enhancement worrisome for metastatic disease or lymphoma. Patient ?may need metastatic workup. There are also bilateral adrenal gland ?lesions and borderline retroperitoneal lymph nodes. ? ? ?O:// ?Current vital signs: ?BP (!) 93/58   Pulse 73   Temp 99.2 ?F (37.3 ?C) (Oral)   Resp 18   Ht '5\' 9"'$  (1.753 m)   Wt 95.3 kg   SpO2 95%   BMI 31.01 kg/m?  ?Vital signs in last 24 hours: ?Temp:  [98.6 ?F (37 ?C)-99.6 ?F (37.6 ?C)] 99.2 ?F (37.3 ?C) (05/15 0325) ?Pulse Rate:  [65-86] 73 (05/15 0325) ?Resp:  [14-18] 18 (05/14 2357) ?BP: (93-115)/(58-72) 93/58 (05/15 0325) ?SpO2:  [94 %-98 %] 95 % (05/15 0325) ? ?GENERAL: Awake, alert in NAD ?HEENT: - Normocephalic and atraumatic, dry mm ?LUNGS - Clear to auscultation bilaterally with no wheezes ?CV - S1S2 RRR, no m/r/g, equal pulses  bilaterally. ?ABDOMEN - Soft, nontender, nondistended with normoactive BS ?Ext: warm, well perfused, intact peripheral pulses, no  edema ?NEURO:  ?Mental Status: AA&Ox4 ?Language: speech is clear.  Naming, repetition, fluency, and comprehension intact. ?Cranial Nerves: PERRL 3 mm/brisk. EOMI, visual fields full, no facial asymmetry, facial sensation intact, hearing intact, tongue/uvula/soft palate midline, normal sternocleidomastoid and trapezius muscle strength. No evidence of tongue atrophy or fibrillations ?Motor: bilateral uppers 5/5, left lower 4/5, right lower 0/5  ?Reflexes: 3+ patellar, achilles left 3+, right 0 ?Tone: is normal and bulk is normal ?Sensation- Intact to light touch bilaterally ?Coordination: FTN intact bilaterally ?Gait- deferred ? ? ?Medications ? ?Current Facility-Administered Medications:  ?  acetaminophen (TYLENOL) tablet 650 mg, 650 mg, Oral, Q4H PRN, 650 mg at 03/30/22 2248 **OR** acetaminophen (TYLENOL) 160 MG/5ML solution 650 mg, 650 mg, Per Tube, Q4H PRN **OR** acetaminophen (TYLENOL) suppository 650 mg, 650 mg, Rectal, Q4H PRN, Elodia Florence., MD ?  Chlorhexidine Gluconate Cloth 2 % PADS 6 each, 6 each, Topical, Daily, Mariel Aloe, MD, 6 each at 03/30/22 1337 ?  enoxaparin (LOVENOX) injection 40 mg, 40 mg, Subcutaneous, Q24H, Elodia Florence., MD, 40 mg at 03/30/22 2208 ?  ezetimibe (ZETIA) tablet 10 mg, 10 mg, Oral, q AM, Mariel Aloe, MD, 10 mg at 03/31/22 0604 ?  feeding supplement (GLUCERNA SHAKE) (GLUCERNA SHAKE) liquid 237 mL, 237 mL, Oral, BID BM, Mariel Aloe, MD, 237 mL at 03/30/22 0842 ?  finasteride (PROSCAR) tablet 5 mg, 5 mg, Oral, QHS, Nettey, Evalee Jefferson, MD, 5 mg at  03/30/22 2208 ?  gadobutrol (GADAVIST) 1 MMOL/ML injection 10 mL, 10 mL, Intravenous, Once PRN, Donnetta Simpers, MD ?  ipratropium-albuterol (DUONEB) 0.5-2.5 (3) MG/3ML nebulizer solution 3 mL, 3 mL, Nebulization, Q4H PRN, Mariel Aloe, MD ?  levothyroxine (SYNTHROID) tablet 112  mcg, 112 mcg, Oral, QAC breakfast, Mariel Aloe, MD, 112 mcg at 03/31/22 0604 ?  multivitamin with minerals tablet 1 tablet, 1 tablet, Oral, Daily, Mariel Aloe, MD, 1 tablet at 03/30/22 9323 ?  oxyCODONE (Oxy IR/ROXICODONE) immediate release tablet 2.5-5 mg, 2.5-5 mg, Oral, Q6H PRN, Mariel Aloe, MD, 5 mg at 03/31/22 0152 ?  ranolazine (RANEXA) 12 hr tablet 1,000 mg, 1,000 mg, Oral, BID, Mariel Aloe, MD, 1,000 mg at 03/30/22 2208 ?  revefenacin (YUPELRI) nebulizer solution 175 mcg, 175 mcg, Nebulization, Daily, Paytes, Austin A, RPH, 175 mcg at 03/30/22 1110 ?  senna-docusate (Senokot-S) tablet 1 tablet, 1 tablet, Oral, QHS PRN, Elodia Florence., MD ?  tamsulosin [facility]) capsule 0.4 mg, 0.4 mg, Oral, Q supper, Mariel Aloe, MD, 0.4 mg at 03/30/22 1628 ? ?Labs ?I have reviewed Labs in Epic and the results pertinent to this consultation are:  ?LDL- not calculated as Triglycerides were 271. (Allergy to statins and on Zetia) ?Vit B12- 636 ?Ceruloplasmin-28.7 ?Folate-7.5 ?Zinc WNL 68 ?Copper 141 ? ? ? ?Assessment:  ?66 y.o. 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 Of note, he had urinary retention at the time of hospitalization in early may.Since discharge he has had recurrent falls and progressive weakness of RLE. He does report falling in the shower prior to admission and hurting his right knee  ? ?MRI brain wwo contrast  personally reviewed and showed multifocal chronic infarcts. Radiology suggested appearance might be consistent with demyelinating disease however these lesions showed restricted diffusion on prior admissions c/w acute multifocal ischemic infarcts.  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  ? ?On MRI lumbar spine he has multiple contrast enhancing vertebral lesions c/f lymphoma or other malignancy. Based on the number of multifocal ischemic embolic strokes he has had over the past 5 mos, I am v concerned that he is hypercoagulable 2/2 underlying malignancy.  ? ?CT c/a/p c/f liver, spleen, and adrenal metastases and diffuse LAD of upper abd and retroperitoneum. Liver biopsy ordered but planned for later this week or early next as patient is on plavix and this needs to be held for 5 days prior. Given these findings I feel comfortable that the nonenhancing thoracic cord lesion is most likely to represent spinal cord infarct and does not require further workup with LP at this time ? ? ?Recommendations: ?- Liver biopsy when able to obtain ?- No further inpatient neurologic workup indicated at this time ?- Neurology to sign off, but feel free to re-engage if additional neurologic concerns arise. ? ?Su Monks, MD ?[facility] ?[phone] ? ?If 7pm- 7am, please page neurology on  call as listed in [facility]. ? ?

## 2022-03-31 NOTE — Progress Notes (Signed)
Occupational Therapy Treatment ?Patient Details ?Name: Austin Oliver ?MRN: [id number] ?DOB: [birth year] ?Today's Date: 03/31/2022 ? ? ?History of present illness The pt is a 66 yo male presenting 03/25/22 with recurrent falls and worsening B LE weakenss, R>L. CT suggestive of demylenating process vs microvascular ischemic disease. 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 ?  ?OT comments ? Pt progressing towards goals, pt able to stand x2 with use of Stedy mod A +2 to pull/up knee block. Pt with LUE fatigue/weakness, cues to keep pt upright as he is wanting to rest on elbow. Pt transferred to chair via  Stedy, provided pt with green theraband for BUE strengthening and demo'd exercises for pt. Pt verbalized understanding. Pt presenting with impairments listed below, will follow acutely. Continue to recommend AIR at d/c.  ? ?Recommendations for follow up therapy are one component of a multi-disciplinary discharge planning process, led by the attending physician.  Recommendations may be updated based on patient status, additional functional criteria and insurance authorization. ?   ?Follow Up Recommendations ? Acute inpatient rehab (3hours/day)  ?  ?Assistance Recommended at Discharge Frequent or constant Supervision/Assistance  ?Patient can return home with the following ? Two people to help with walking and/or transfers;A lot of help with bathing/dressing/bathroom;Assist for transportation;Help with stairs or ramp for entrance ?  ?Equipment Recommendations ? Other (comment)  ?  ?Recommendations for Other Services   ? ?  ?Precautions / Restrictions Precautions ?Precautions: Fall ?Restrictions ?Weight Bearing Restrictions: No  ? ? ?  ? ?Mobility Bed Mobility ?Overal bed mobility: Needs Assistance ?Bed Mobility: Supine to Sit ?   ?  ?Supine to sit: Min assist, HOB elevated ?  ?  ?General bed mobility comments: to assist RLE ?  ? ?Transfers ?Overall transfer level: Needs assistance ?Equipment used: Ambulation equipment used ?Transfers: Sit to/from Stand, Bed to chair/wheelchair/BSC ?Sit to Stand: Mod assist, +2 physical assistance ?  ?  ?  ?  ?  ?General transfer comment: pt reporting fatigue/LUE weakness while in [place], cues to keep pt upright and not lean on elbow ?Transfer via Lift Equipment: Stedy ?  ?Balance Overall balance assessment: History of Falls, Needs assistance ?Sitting-balance support: No upper extremity supported, Feet supported ?Sitting balance-Leahy Scale: Fair ?  ?  ?Standing balance support: Bilateral upper extremity supported, During functional activity ?Standing balance-Leahy Scale: Zero ?Standing balance comment: dependent on +2 moderate assist ?  ?  ?  ?  ?  ?  ?  ?  ?  ?  ?  ?   ? ?ADL either performed or assessed with clinical judgement  ? ?ADL   ?  ?  ?  ?  ?  ?  ?  ?  ?  ?  ?Lower Body Dressing: Maximal assistance;Sit to/from stand;Sitting/lateral leans ?Lower Body Dressing Details (indicate cue type and reason): pt unable to reach down for donning/doffing socks without LOB ?Toilet Transfer: Moderate assistance;+2 for physical assistance ?Toilet Transfer Details (indicate cue type and reason): simulated to chair with use of Stedy ?  ?  ?  ?  ?  ?  ?  ? ?Extremity/Trunk Assessment Upper Extremity Assessment ?Upper Extremity Assessment: Overall WFL for tasks assessed ?  ?Lower Extremity Assessment ?Lower Extremity Assessment: Defer to PT evaluation ?  ?  ?  ? ?Vision   ?  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  ?Perception Perception ?Perception: Not tested ?  ?Praxis Praxis ?Praxis: Not tested ?  ? ?Cognition Arousal/Alertness: Awake/alert ?Behavior During Therapy: [facility] for tasks assessed/performed ?Overall Cognitive Status: Within  Functional Limits for tasks assessed ?  ?  ?  ?  ?  ?  ?  ?  ?  ?  ?  ?  ?  ?  ?  ?  ?General Comments: able to recall previous therapy session events, aware of rehab as d/c plan ?  ?  ?   ?Exercises Exercises: General Upper Extremity ?General Exercises - Upper Extremity ?Shoulder Flexion: Theraband, Both, 5 reps, Seated ? ?  ?Shoulder Instructions   ? ? ?  ?General Comments VSS on RA  ? ? ?Pertinent Vitals/ Pain       Pain Assessment ?Pain Assessment: No/denies pain ? ?Home Living   ?  ?  ?  ?  ?  ?  ?  ?  ?  ?  ?  ?  ?  ?  ?  ?  ?  ?  ? ?  ?Prior Functioning/Environment    ?  ?  ?  ?   ? ?Frequency ? Min 2X/week  ? ? ? ? ?  ?Progress Toward Goals ? ?OT Goals(current goals can now be found in the care plan section) ? Progress towards OT goals: Progressing toward goals ? ?Acute Rehab OT Goals ?Patient Stated Goal: none stated ?OT Goal Formulation: With patient ?Time For Goal Achievement: 04/09/22 ?Potential to Achieve Goals: Good ?ADL Goals ?Pt Will Perform Lower Body Bathing: with min guard assist;sitting/lateral leans;with adaptive equipment ?Pt Will Perform Lower Body Dressing: with supervision;sitting/lateral leans;with adaptive equipment ?Pt Will Transfer to Toilet: with supervision;with transfer board;bedside commode ?Pt Will Perform Toileting - Clothing Manipulation and hygiene: with supervision;sitting/lateral leans ?Additional ADL Goal #1: Pt will stand with moderate assist as a precursor to ADLs.  ?Plan Discharge plan remains appropriate   ? ?Co-evaluation ? ? ?   ?  ?  ?  ?  ? ?  ?AM-PAC OT "6 Clicks" Daily Activity     ?Outcome Measure ? ? Help from another person eating meals?: None ?Help from another person taking care of personal grooming?: A Little ?Help from another person toileting, which includes using toliet, bedpan, or urinal?: A Lot ?Help from another person bathing (including washing, rinsing, drying)?: A Lot ?Help from another person to put on and taking off regular upper body clothing?: A  Little ?Help from another person to put on and taking off regular lower body clothing?: A Lot ?6 Click Score: 16 ? ?  ?End of Session Equipment Utilized During Treatment: Gait belt;Other (comment) Charlaine Dalton) ? ?OT Visit Diagnosis: Unsteadiness on feet (R26.81);Other abnormalities of gait and mobility (R26.89);Muscle weakness (generalized) (M62.81);History of falling (Z91.81);Pain ?  ?Activity Tolerance Patient tolerated treatment well ?  ?Patient Left in chair;with call bell/phone within reach;with chair alarm set ?  ?Nurse Communication Mobility status ?  ? ?   ? ?Time: 4854-6270 ?OT Time Calculation (min): 27 min ? ?Charges: OT General Charges ?$OT Visit: 1 Visit ?OT Treatments ?$Therapeutic Activity: 23-37 mins ? ?Lynnda Child, OTD, OTR/L ?Acute Rehab ?[phone] ? ? ?Kaylyn Lim ?03/31/2022, 2:08 PM ?

## 2022-03-31 NOTE — Progress Notes (Signed)
Inpatient Rehabilitation Admissions Coordinator  ? ?I met with patient at bedside for rehab assessment. I reviewed goals and expectations of a possible [facility] admit. He prefers [facility]. I await updated OT notes and then will begin insurance Auth for possible CIR admit. ? ?Danne Baxter, RN, MSN ?Rehab Admissions Coordinator ?[phone] ?03/31/2022 12:12 PM ? ?

## 2022-03-31 NOTE — Progress Notes (Signed)
? ?PROGRESS NOTE ? ? ? ?Austin Oliver  VZC:[id number] DOB: [birth year] DOA: 03/25/2022 ?PCP: [name], MD ? ? ?Brief Narrative: ?Austin Oliver is a 66 y.o. male with medical history significant of recurrent strokes, CAD, COPD, sick sinus syndrome s/p pacemaker placement, cirrhosis with recent admission for stroke with right lower extremity weakness with concern for possible demyelination disease. ? ?Assessment and Plan: ?Stroke-like symptoms ?Initial concern for stroke but neurology more concerned for demyelenating disease. Initial MRI brain correlates with less likely stroke and more likely demyelinating disease. Neurology recommendation for MRI w/wo contrast of brain, cervical/thoracic/lumbar spine. PT recommending inpatient rehabilitation. 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 MRI thoracic/lumbar spine significant for abnormal cord signal at T3 and T4 levels suggestive of cord ischemia vs transverse myelitis. 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 ? ?Recurrent falls ?In setting of recent stroke and concern for demyelinating disease. PT/OT ordered and recommend inpatient rehabiliation. ? ?History of CVA (cerebrovascular accident) ?Patient is on Aspirin/Plavix, Crestor, Ranexa and Zetia as an outpatient. Regimen continued on admission. Plavix and aspirin recommended to be held by IR for biopsy. ?-Continue Zetia and Ranexa ? ?Acute urinary retention ?Chronic issue. Foley exchanged on admission, 5/9. Urinalysis not suggestive of infection. ?-Continue Foley, finasteride and Flomax ? ?Abnormal bowel habits ?Diarrhea, then constipation ? ?CAD (coronary artery disease) ?Patient is on Aspirin, Plavix, Ranexa, Crestor as an outpatient. No current chest pain. Direct LDL of 16.9. ? ?Sick sinus syndrome ([place]) ?S/p pacemaker, [place] MR conditional pacemaker ? ?Essential hypertension ?Patient is not on medication  management as an outpatient. ? ?Type 2 diabetes mellitus ([place]) ?Metformin discontinued at prior hospitalization secondary to diarrhea. Most recent hemoglobin A1C of 5.4%. Well controlled. ? ?COPD (chronic obstructive pulmonary disease) ([place]) ?Currently asymptomatic.  ? ?Cirrhosis ([place]) ?Noted on prior abdominal ultrasound. ? ?Avascular necrosis of femoral head, right ([place]) ?Noted incidentally on CT imaging. No subchondral collapse. Asymptomatic. ? ?AAA (abdominal aortic aneurysm) ([place]) ?Infrarenal. History of smoking. Currently measuring 3.3 cm. Recommendation for follow-up every 3 years. ? ?Liver masses ?Concern for metastatic cancer. CT chest/abd/pelvis with masses of the liver, masses of the adrenal glands, abdominal lymphadenopathy. ?-IR consult for biopsy (Delayed secondary to Aspirin/Plavix) ? 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 No clinical features currently concerning for hyperfunctioning lesion at this time.  ? ?MRI thoracic/lumbar spine significant for T1 marrow lesion concerning for possible malignancy. Borderline rertoperitoneal lymph nodes noted. Discussed with medical oncology for recommendations on possible workup. Obtain LDH. Discussed with neurology who are concerned patient has metastatic disease and suffered a stroke in his spinal cord. ? ? ? ?DVT prophylaxis: Lovenox ?Code Status:   Code Status: Full Code ?Family Communication: Wife at bedside ?Disposition Plan: Discharge to inpatient rehabilitation pending bed availability ? ? ?Consultants:  ?Neurology ? ?Procedures:  ?None ? ?Antimicrobials: ?None  ? ? ?Subjective: ?Patient reports some decreased sensation in left foot. Still not able to move right leg. ? ?Objective: ?BP 103/61 (BP Location: Right Arm)   Pulse 73   Temp 99.2 ?F (37.3 ?C) (Oral)   Resp 18   Ht '5\' 9"'$  (1.753 m)   Wt 95.3 kg   SpO2 96%   BMI 31.01 kg/m?   ? ?Examination: ? ?General exam: Appears calm and comfortable ?Respiratory system: Clear to auscultation. Respiratory effort normal. ?Cardiovascular system: S1 & S2 heard, RRR. No murmurs ?Gastrointestinal  system: Abdomen is nondistended, soft and nontender. Normal bowel sounds heard. ?Central nervous system: Alert and oriented. ?Musculoskeletal: No edema. No calf tenderness ?Skin: No cyanosis. No rashes ?Psychiatry: Judgement and insight appear normal. Mood & affect appropriate.  ? ? ?Data Reviewed: I have personally reviewed following labs and imaging studies ? ?CBC ?Lab Results  ?Component Value Date  ? WBC 3.1 (L) 03/30/2022  ? RBC 3.84 (L) 03/30/2022  ? HGB 11.3 (L) 03/30/2022  ? HCT 33.9 (L) 03/30/2022  ? MCV 88.3 03/30/2022  ? MCH 29.4 03/30/2022  ? PLT 130 (L) 03/30/2022  ? MCHC 33.3 03/30/2022  ? RDW 15.1 03/30/2022  ? LYMPHSABS 0.8 03/30/2022  ? MONOABS 0.6 03/30/2022  ? EOSABS 0.1 03/30/2022  ? BASOSABS 0.0 03/30/2022  ? ? ? ?Last metabolic panel ?Lab Results  ?Component Value Date  ? NA 135 03/30/2022  ? K 4.5 03/30/2022  ? CL 100 03/30/2022  ? CO2 26 03/30/2022  ? BUN 23 03/30/2022  ? CREATININE 1.24 03/30/2022  ? GLUCOSE 125 (H) 03/30/2022  ? GFRNONAA >60 03/30/2022  ? GFRAA 61 11/30/2020  ? CALCIUM 8.8 (L) 03/30/2022  ? PROT 7.1 03/25/2022  ? ALBUMIN 3.5 03/25/2022  ? BILITOT 1.6 (H) 03/25/2022  ? ALKPHOS 71 03/25/2022  ? AST 23 03/25/2022  ? ALT 20 03/25/2022  ? ANIONGAP 9 03/30/2022  ? ? ?GFR: ?Estimated Creatinine Clearance: 65.8 mL/min (by C-G formula based on SCr of 1.24 mg/dL). ? ?Recent Results (from the past 240 hour(s))  ?Resp Panel by RT-PCR (Flu A&B, Covid) Nasopharyngeal Swab     Status: None  ? Collection Time: 03/25/22  2:48 PM  ? Specimen: Nasopharyngeal Swab; Nasopharyngeal(NP) swabs in vial transport medium  ?Result Value Ref Range Status  ? SARS Coronavirus 2 by RT PCR NEGATIVE NEGATIVE Final  ?  Comment: (NOTE) ?SARS-CoV-2 target nucleic acids are NOT DETECTED. ? ?The SARS-CoV-2  RNA is generally detectable in upper respiratory ?specimens during the acute phase of infection. The lowest ?concentration of SARS-CoV-2 viral copies this assay can detect is ?138 copies/mL. A negative result does not preclude SARS-Cov-2 ?infection and should not be used as the sole basis for treatment or ?other patient management decisions. A negative result may occur with  ?improper specimen collection/handling, submission of specimen other ?than nasopharyngeal swab, presence of viral mutation(s) within the ?areas targeted by this assay, and inadequate number of viral ?copies(<138 copies/mL). A negative result must be combined with ?clinical observations, patient history, and epidemiological ?information. The expected result is Negative. ? ?Fact Sheet for Patients:  ?EntrepreneurPulse.com.au ? ?Fact Sheet for Healthcare Providers:  ?IncredibleEmployment.be ? ?This test is no t yet approved or cleared by the [place] FDA and  ?has been authorized for detection and/or diagnosis of SARS-CoV-2 by ?FDA under an Emergency Use Authorization (EUA). This EUA will remain  ?in effect (meaning this test can be used) for the duration of the ?COVID-19 declaration under Section 564(b)(1) of the Act, 21 ?U.S.C.section 360bbb-3(b)(1), unless the authorization is terminated  ?or revoked sooner.  ? ? ?  ? Influenza A by PCR NEGATIVE NEGATIVE Final  ? Influenza B by PCR NEGATIVE NEGATIVE Final  ?  Comment: (NOTE) ?The Xpert Xpress SARS-CoV-2/FLU/RSV plus assay is intended as an aid ?in the diagnosis of influenza from Nasopharyngeal swab specimens and ?should not be used as a sole basis for treatment. Nasal washings and ?aspirates are unacceptable for Xpert Xpress SARS-CoV-2/FLU/RSV ?testing. ? ?Fact Sheet for Patients: ?EntrepreneurPulse.com.au ? ?Fact Sheet for Healthcare Providers: ?IncredibleEmployment.be ? ?  This test is not yet approved or cleared by the  [place] FDA and ?has been authorized for detection and/or diagnosis of SARS-CoV-2 by ?FDA under an Emergency Use Authorization (EUA). This EUA will remain ?in effect (meaning this test can be used) for the duration of the ?COVI

## 2022-04-01 DIAGNOSIS — R338 Other retention of urine: Secondary | ICD-10-CM | POA: Diagnosis not present

## 2022-04-01 DIAGNOSIS — Z8673 Personal history of transient ischemic attack (TIA), and cerebral infarction without residual deficits: Secondary | ICD-10-CM | POA: Diagnosis not present

## 2022-04-01 DIAGNOSIS — I10 Essential (primary) hypertension: Secondary | ICD-10-CM | POA: Diagnosis not present

## 2022-04-01 DIAGNOSIS — R531 Weakness: Secondary | ICD-10-CM | POA: Diagnosis not present

## 2022-04-01 LAB — CREATININE, SERUM
Creatinine, Ser: 1.25 mg/dL — ABNORMAL HIGH (ref 0.61–1.24)
GFR, Estimated: >60 mL/min (ref >60)

## 2022-04-01 NOTE — Progress Notes (Addendum)
Physical Therapy Treatment ?Patient Details ?Name: Austin Oliver ?MRN: [id number] ?DOB: [birth year] ?Today's Date: 04/01/2022 ? ? ?History of Present Illness The pt is a 66 yo male presenting 03/25/22 with recurrent falls and worsening B LE weakenss, R>L. CT suggestive of demylenating process vs microvascular ischemic disease. 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 ? ?  ?PT Comments  ? ? Pt anxious, but agreeable to participation.  Pt needed some hopeful and realistic discussion on progression of therapies.  Emphasis on transitions, sitting balance, sit to standing in the EVA walker with standing activity for 2-3 minutes over 2 trials.  Pt then exhausted and wanted to end up in bed. ? ?   ?Recommendations for follow up therapy are one component of a multi-disciplinary discharge planning process, led by the attending physician.  Recommendations may be updated based on patient status, additional functional criteria and insurance authorization. ? ?Follow Up Recommendations ? Acute inpatient rehab (3hours/day) ?  ?  ?Assistance Recommended at Discharge Frequent or constant Supervision/Assistance  ?Patient can return home with the following Two people to help with walking and/or transfers;A lot of help with bathing/dressing/bathroom;Assistance with cooking/housework;Help with stairs or ramp for entrance;Assist for transportation ?  ?Equipment Recommendations ? Wheelchair (measurements PT);Wheelchair cushion (measurements PT)  ?  ?Recommendations for Other Services   ? ? ?  ?Precautions / Restrictions Precautions ?Precautions: Fall  ?  ? ?Mobility ? Bed Mobility ?Overal bed mobility: Needs Assistance ?Bed Mobility: Rolling, Sidelying to Sit, Sit to Sidelying ?Rolling: Mod assist ?Sidelying to sit: Mod assist, Min assist ?  ?   ?Sit to sidelying: Min assist, Mod assist, +2 for safety/equipment ?General bed mobility comments: R LE needed assist to return to the bed. ?  ? ?Transfers ?Overall transfer level: Needs assistance ?Equipment used:  (EVA walker) ?Transfers: Sit to/from Stand ?Sit to Stand: Mod assist, +2 physical assistance ?  ?  ?  ?  ?  ?General transfer comment: cued/demonstrated optimal sit to stand technique and assisted forward and with boost, R knee needing significant support and blocking throughout.  Stood x2 for 2-3 minutes each. ?  ? ?Ambulation/Gait ?  ?  ?  ?  ?  ?  ?  ?  ? ? ?Stairs ?  ?  ?  ?  ?  ? ? ?Wheelchair Mobility ?  ? ?Modified Rankin (Stroke Patients Only) ?  ? ? ?  ?Balance Overall balance assessment: History of Falls, Needs assistance ?Sitting-balance support: No upper extremity supported, Feet supported ?Sitting balance-Leahy Scale: Fair ?Sitting balance - Comments: pt fatigues trying to maintain sitting balance for a long time on the low airloss bed, but able to sit without assist. ?  ?  ?Standing balance-Leahy Scale: Zero ?Standing balance comment: stood into the EVA walker x2 for 2-3 min each trial, working on upright posture, pelvis and buttock tucked in, w/shifting with holds ?  ?  ?  ?  ?  ?  ?  ?  ?  ?  ?  ?  ? ?  ?Cognition Arousal/Alertness: Awake/alert ?Behavior During Therapy: [facility] for tasks assessed/performed ?Overall Cognitive Status: Within Functional Limits for tasks assessed ?  ?  ?  ?  ?  ?  ?  ?  ?  ?  ?  ?  ?  ?  ?  ?  ?  ?  ?  ? ?  ?  Exercises Other Exercises ?Other Exercises: hip/knee flex/ext ROM with graded active assist and resistance L LE and PROM to the R LE with trace extension/add at hip on the R. ?Other Exercises: ` ? ?  ?General Comments   ?  ?  ? ?Pertinent Vitals/Pain Pain Assessment ?Pain Assessment: Faces ?Faces Pain Scale: Hurts a little bit ?Pain Location: R knee with blocking to stand ?Pain Descriptors / Indicators: Grimacing, Guarding ?Pain Intervention(s): Monitored  during session  ? ? ?Home Living   ?  ?  ?  ?  ?  ?  ?  ?  ?  ?   ?  ?Prior Function    ?  ?  ?   ? ?PT Goals (current goals can now be found in the care plan section) Acute Rehab PT Goals ?Patient Stated Goal: to get stronger ?PT Goal Formulation: With patient ?Time For Goal Achievement: 04/09/22 ?Potential to Achieve Goals: Good ?Progress towards PT goals: Progressing toward goals ? ?  ?Frequency ? ? ? Min 3X/week ? ? ? ?  ?PT Plan Current plan remains appropriate  ? ? ?Co-evaluation   ?  ?  ?  ?  ? ?  ?AM-PAC PT "6 Clicks" Mobility   ?Outcome Measure ? Help needed turning from your back to your side while in a flat bed without using bedrails?: A Little ?Help needed moving from lying on your back to sitting on the side of a flat bed without using bedrails?: A Lot ?Help needed moving to and from a bed to a chair (including a wheelchair)?: A Lot ?Help needed standing up from a chair using your arms (e.g., wheelchair or bedside chair)?: Total ?Help needed to walk in hospital room?: Total ?Help needed climbing 3-5 steps with a railing? : Total ?6 Click Score: 10 ? ?  ?End of Session Equipment Utilized During Treatment: Gait belt ?Activity Tolerance: Patient tolerated treatment well;Patient limited by fatigue ?Patient left: in bed;with call bell/phone within reach;with bed alarm set;with family/visitor present ?Nurse Communication: Mobility status ?PT Visit Diagnosis: Muscle weakness (generalized) (M62.81);Other abnormalities of gait and mobility (R26.89);Other symptoms and signs involving the nervous system (R29.898) ?  ? ? ?Time: 7341-9379 ?PT Time Calculation (min) (ACUTE ONLY): 45 min ? ?Charges:  $Therapeutic Activity: 23-37 mins ?$Neuromuscular Re-education: 8-22 mins          ?          ? ?04/01/2022 ? ?Ginger Carne., PT ?Acute Rehabilitation Services ?[phone]  (pager) ?[phone]  (office) ? ? ?Austin Oliver ?04/01/2022, 6:23 PM ? ?

## 2022-04-01 NOTE — Progress Notes (Signed)
? ?PROGRESS NOTE ? ? ? ?Austin Oliver  WHQ:[id number] DOB: 06/10/1956 DOA: 03/25/2022 ?PCP: [name], MD ? ? ?Brief Narrative: ?Austin Oliver is a 66 y.o. male with medical history significant of recurrent strokes, CAD, COPD, sick sinus syndrome s/p pacemaker placement, cirrhosis with recent admission for stroke with right lower extremity weakness with concern for possible demyelination disease but more likely spinal cord infarct. Also with evidence of metastatic cancer; liver biopsy pending. ? ?Assessment and Plan: ?* Spinal cord infarction [facility]) ?Initial MRI brain correlates with less likely stroke and more likely demyelinating disease. Neurology recommendation for MRI w/wo contrast of brain, cervical/thoracic/lumbar spine. 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 MRI thoracic/lumbar spine significant for abnormal cord signal at T3 and T4 levels suggestive of cord ischemia vs transverse myelitis. Neurology concern mostly for spinal cord infarct leading to symptoms secondary to hypercoagulable state from presumed metastatic cancer. PT recommending inpatient rehabilitation on discharge.  ? ?Recurrent falls ?In setting of recent stroke and concern for demyelinating disease. PT/OT ordered and recommend inpatient rehabiliation. ? ?History of CVA (cerebrovascular accident) ?Patient is on Aspirin/Plavix, Crestor, Ranexa and Zetia as an outpatient. Regimen continued on admission. Plavix and aspirin recommended to be held by IR for biopsy. ?-Continue Zetia and Ranexa ?-Holding aspirin and Plavix ? ?Acute urinary retention ?Chronic issue. Foley exchanged on admission, 5/9. Urinalysis not suggestive of infection. ?-Continue Foley, finasteride and Flomax ? ?Abnormal bowel habits ?Diarrhea, then constipation. Stable. ? ?CAD (coronary artery disease) ?Patient is on Aspirin, Plavix, Ranexa, Crestor as an outpatient. No current chest pain. Direct LDL  of 16.9. ? ?Sick sinus syndrome ([place]) ?S/p pacemaker, [place] MR conditional pacemaker ? ?Essential hypertension ?Patient is not on medication management as an outpatient. ? ?Type 2 diabetes mellitus ([place]) ?Metformin discontinued at prior hospitalization secondary to diarrhea. Most recent hemoglobin A1C of 5.4%. Well controlled. ? ?COPD (chronic obstructive pulmonary disease) ([place]) ?Currently asymptomatic.  ? ?Cirrhosis ([place]) ?Noted on prior abdominal ultrasound. ? ?Avascular necrosis of femoral head, right ([place]) ?Noted incidentally on CT imaging. No subchondral collapse. Asymptomatic. ? ?AAA (abdominal aortic aneurysm) ([place]) ?Infrarenal. History of smoking. Currently measuring 3.3 cm. Recommendation for follow-up every 3 years. ? ?Liver masses ?Concern for metastatic cancer. CT chest/abd/pelvis with masses of the liver, masses of the adrenal glands, abdominal lymphadenopathy. ?-IR consult for biopsy (Delayed secondary to Aspirin/Plavix). Plan for biopsy on 5/19. ? 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 No clinical features currently concerning for hyperfunctioning lesion at this time.  ? ? ? ?DVT prophylaxis: Lovenox ?Code Status:   Code Status: Full Code ?Family Communication: Wife at bedside ?Disposition Plan: Discharge to inpatient rehabilitation pending bed availability and insurance authorization. Insurance denied initial claim on 5/16; peer-to-peer performed. Plan for appeal. ? ? ?Consultants:  ?Neurology ? ?Procedures:  ?None ? ?Antimicrobials: ?None  ? ? ?Subjective: ?Patient has started to have some improved sensation in his leg. He is able to move his leg more. ? ?Objective: ?BP 111/66 (BP Location: Left Arm)   Pulse 72   Temp 99 ?F (37.2 ?C) (Oral)   Resp (!) 21   Ht '5\' 9"'$  (1.753 m)   Wt 95.3 kg   SpO2 95%   BMI 31.01 kg/m?  ? ?Examination: ? ?General exam: Appears calm and comfortable ?Respiratory  system: Clear to auscultation. Respiratory effort normal. ?Cardiovascular system: S1 & S2 heard, RRR. No murmurs. ?Gastrointestinal  system: Abdomen is nondistended, soft and nontender. Normal bowel sounds heard. ?Central nervous system: Alert and oriented. 1/5 RLE strength. ?Musculoskeletal: No edema. No calf tenderness ?Skin: No cyanosis. No rashes ?Psychiatry: Judgement and insight appear normal. Mood & affect appropriate. ? ? ?Data Reviewed: I have personally reviewed following labs and imaging studies ? ?CBC ?Lab Results  ?Component Value Date  ? WBC 3.1 (L) 03/30/2022  ? RBC 3.84 (L) 03/30/2022  ? HGB 11.3 (L) 03/30/2022  ? HCT 33.9 (L) 03/30/2022  ? MCV 88.3 03/30/2022  ? MCH 29.4 03/30/2022  ? PLT 130 (L) 03/30/2022  ? MCHC 33.3 03/30/2022  ? RDW 15.1 03/30/2022  ? LYMPHSABS 0.8 03/30/2022  ? MONOABS 0.6 03/30/2022  ? EOSABS 0.1 03/30/2022  ? BASOSABS 0.0 03/30/2022  ? ? ? ?Last metabolic panel ?Lab Results  ?Component Value Date  ? NA 135 03/30/2022  ? K 4.5 03/30/2022  ? CL 100 03/30/2022  ? CO2 26 03/30/2022  ? BUN 23 03/30/2022  ? CREATININE 1.25 (H) 04/01/2022  ? GLUCOSE 125 (H) 03/30/2022  ? GFRNONAA >60 04/01/2022  ? GFRAA 61 11/30/2020  ? CALCIUM 8.8 (L) 03/30/2022  ? PROT 7.1 03/25/2022  ? ALBUMIN 3.5 03/25/2022  ? BILITOT 1.6 (H) 03/25/2022  ? ALKPHOS 71 03/25/2022  ? AST 23 03/25/2022  ? ALT 20 03/25/2022  ? ANIONGAP 9 03/30/2022  ? ? ?GFR: ?Estimated Creatinine Clearance: 65.3 mL/min (A) (by C-G formula based on SCr of 1.25 mg/dL (H)). ? ?Recent Results (from the past 240 hour(s))  ?Resp Panel by RT-PCR (Flu A&B, Covid) Nasopharyngeal Swab     Status: None  ? Collection Time: 03/25/22  2:48 PM  ? Specimen: Nasopharyngeal Swab; Nasopharyngeal(NP) swabs in vial transport medium  ?Result Value Ref Range Status  ? SARS Coronavirus 2 by RT PCR NEGATIVE NEGATIVE Final  ?  Comment: (NOTE) ?SARS-CoV-2 target nucleic acids are NOT DETECTED. ? ?The SARS-CoV-2 RNA is generally detectable in upper  respiratory ?specimens during the acute phase of infection. The lowest ?concentration of SARS-CoV-2 viral copies this assay can detect is ?138 copies/mL. A negative result does not preclude SARS-Cov-2 ?infection and should not be used as the sole basis for treatment or ?other patient management decisions. A negative result may occur with  ?improper specimen collection/handling, submission of specimen other ?than nasopharyngeal swab, presence of viral mutation(s) within the ?areas targeted by this assay, and inadequate number of viral ?copies(<138 copies/mL). A negative result must be combined with ?clinical observations, patient history, and epidemiological ?information. The expected result is Negative. ? ?Fact Sheet for Patients:  ?EntrepreneurPulse.com.au ? ?Fact Sheet for Healthcare Providers:  ?IncredibleEmployment.be ? ?This test is no t yet approved or cleared by the [place] FDA and  ?has been authorized for detection and/or diagnosis of SARS-CoV-2 by ?FDA under an Emergency Use Authorization (EUA). This EUA will remain  ?in effect (meaning this test can be used) for the duration of the ?COVID-19 declaration under Section 564(b)(1) of the Act, 21 ?U.S.C.section 360bbb-3(b)(1), unless the authorization is terminated  ?or revoked sooner.  ? ? ?  ? Influenza A by PCR NEGATIVE NEGATIVE Final  ? Influenza B by PCR NEGATIVE NEGATIVE Final  ?  Comment: (NOTE) ?The Xpert Xpress SARS-CoV-2/FLU/RSV plus assay is intended as an aid ?in the diagnosis of influenza from Nasopharyngeal swab specimens and ?should not be used as a sole basis for treatment. Nasal washings and ?aspirates are unacceptable for Xpert Xpress SARS-CoV-2/FLU/RSV ?testing. ? ?Fact Sheet for Patients: ?EntrepreneurPulse.com.au ? ?Fact  Sheet for Healthcare Providers: ?IncredibleEmployment.be ? ?This test is not yet approved or cleared by the [place] FDA and ?has been  authorized for detection and/or diagnosis of SARS-CoV-2 by ?FDA under an Emergency Use Authorization (EUA). This EUA will remain ?in effect (meaning this test can be used) for the duration of the ?COVID-19 declaration under [place]

## 2022-04-01 NOTE — Assessment & Plan Note (Addendum)
Initial MRI brain correlates with less likely stroke and more likely demyelinating disease.  ?Neurology was consulted,  ?MRI brain/cervical spine with contrast confirm previously noted concern for demyelination with possible pituitary adenoma.  ?MRI thoracic/lumbar spine significant for abnormal cord signal at T3 and T4 levels suggestive of cord ischemia vs transverse myelitis.  ?Neurology concern mostly for spinal cord infarct leading to symptoms secondary to hypercoagulable state from presumed metastatic cancer.  ?PT recommending inpatient rehabilitation on discharge.  ?Currently aspirin and Plavix on hold. ?On Crestor 5 mg and Zetia. ?

## 2022-04-01 NOTE — Progress Notes (Signed)
Inpatient Rehabilitation Admissions Coordinator  ? ?I have received a denial for CIR admit from [facility] for [place]. I met at bedside with patient and his wife and they are aware. I will begin appeal. ? ?Danne Baxter, RN, MSN ?Rehab Admissions Coordinator ?[phone] ?04/01/2022 2:52 PM ? ?

## 2022-04-02 DIAGNOSIS — G9511 Acute infarction of spinal cord (embolic) (nonembolic): Secondary | ICD-10-CM

## 2022-04-02 DIAGNOSIS — E669 Obesity, unspecified: Secondary | ICD-10-CM | POA: Diagnosis present

## 2022-04-02 NOTE — Assessment & Plan Note (Signed)
Body mass index is 31.01 kg/m?Marland Kitchen  ?Placing the pt at higher risk of poor outcomes.  ?

## 2022-04-02 NOTE — Progress Notes (Signed)
Speech Language Pathology Treatment: Cognitive-Linquistic  ?Patient Details ?Name: Austin Oliver ?MRN: [id number] ?DOB: 06/25/1956 ?Today's Date: 04/02/2022 ?Time: 9528-4132 ?SLP Time Calculation (min) (ACUTE ONLY): 19 min ? ?Assessment / Plan / Recommendation ?Clinical Impression ? Pt was seen for cognitive-linguistic treatment. He was alert and cooperative during the session and he reported that his cognition and word retrieval have progressively improved since admission. Pt was able to accurately recall events from the day, details of his PT session, and information on his plan of care. Pt required no cues for attention throughout the session and he achieved 100% accuracy with complex problem solving. Pt independently recalled  the compensatory strategies for word retrieval which were provided on 03/18/22, but stated that he has not had to use them since his communication has been more functional. Pt reported that he believes his cognition is currently at baseline which per pt, "is not perfect; you know they say I have the beginnings of dementia", but it is likely functional considering pt's level of support and reported use of compensation. Further acute skilled SLP services are not clinically indicated at this time. ?  ?HPI HPI: Pt is a 66 y/o male who presented 5/9 with recurrent falls and worsening B LE weakenss, R>L. CT head 5/9: negative for acute changes, but suggestive of demylenating process vs microvascular ischemic disease. PMH: recent L strokes L MCA and PCA territories and R cerebellum, multiple embolic bilateral strokes with mild left-sided weakness, CAD, AVN of L hip, R TKA, COPD, HLD, HTN, OSA on cpap, and pacemaker placement. ?  ?   ?SLP Plan ? All goals met;Discharge SLP treatment due to (comment) ? ?  ?  ?Recommendations for follow up therapy are one component of a multi-disciplinary discharge planning process, led by the attending physician.  Recommendations may be updated based on patient status,  additional functional criteria and insurance authorization. ?  ? ?Recommendations  ?   ?   ?    ?   ? ? ? ? Assistance recommended at discharge: Intermittent Supervision/Assistance ?SLP Visit Diagnosis: Cognitive communication deficit (R41.841) ?Plan: All goals met;Discharge SLP treatment due to (comment) ? ? ? ? ?  ?  ? ?Austin Oliver, [place], CCC-SLP ?Acute Rehabilitation Services ?Office number [phone] ?Pager [phone] ? ?Austin Oliver ? ?04/02/2022, 3:56 PM ? ? ?

## 2022-04-02 NOTE — Hospital Course (Signed)
Austin Oliver is a 66 y.o. male with medical history significant of recurrent strokes, CAD, COPD, sick sinus syndrome s/p pacemaker placement, cirrhosis with recent admission for stroke with right lower extremity weakness with concern for possible demyelination disease but more likely spinal cord infarct. Also with evidence of metastatic cancer; liver biopsy pending. ?

## 2022-04-02 NOTE — Progress Notes (Signed)
?Progress Note ?Patient: Austin Oliver MPN:[id number] DOB: 07-14-1956 DOA: 03/25/2022  ?DOS: the patient was seen and examined on 04/02/2022 ? ?Brief hospital course: ?Austin Oliver is a 66 y.o. male with medical history significant of recurrent strokes, CAD, COPD, sick sinus syndrome s/p pacemaker placement, cirrhosis with recent admission for stroke with right lower extremity weakness with concern for possible demyelination disease but more likely spinal cord infarct. Also with evidence of metastatic cancer; liver biopsy pending. ?Assessment and Plan: ?* Spinal cord infarction [facility]) ?Initial MRI brain correlates with less likely stroke and more likely demyelinating disease.  ?Neurology was consulted,  ?MRI brain/cervical spine with contrast confirm previously noted concern for demyelination with possible pituitary adenoma.  ?MRI thoracic/lumbar spine significant for abnormal cord signal at T3 and T4 levels suggestive of cord ischemia vs transverse myelitis.  ?Neurology concern mostly for spinal cord infarct leading to symptoms secondary to hypercoagulable state from presumed metastatic cancer.  ?PT recommending inpatient rehabilitation on discharge.  ?Currently aspirin and Plavix on hold. ?On Crestor 5 mg and Zetia. ? ?History of CVA (cerebrovascular accident) ?Patient is on Aspirin/Plavix, Crestor, Ranexa and Zetia as an outpatient. Regimen continued on admission. Plavix and aspirin recommended to be held by IR for biopsy. ?-Continue Zetia and Ranexa ?-Holding aspirin and Plavix ? ?Liver masses ?Liver cirrhosis ?Concern for metastatic cancer.  ?CT chest/abd/pelvis with masses of the liver, masses of the adrenal glands, abdominal lymphadenopathy. ?There is also concern for cirrhosis of the liver. ?-IR consult for biopsy (Delayed secondary to Aspirin/Plavix). Plan for biopsy on 5/19. ? ?Recurrent falls ?In setting of recent stroke and concern for demyelinating disease. PT/OT ordered and recommend inpatient  rehabiliation. ? ?Pituitary microadenoma ([place]) ?MRI brain (5/10) significant for 5 mm hypoenhancing nodule in anterior pituitary consistent with possible micro adenoma. No clinical features currently concerning for hyperfunctioning lesion at this time.  ? ?Chronic retention of urine, due to BPH ?Chronic issue. Foley exchanged on admission, 5/9. Urinalysis not suggestive of infection. ?-Continue Foley, finasteride and Flomax. ?Supposed to follow-up with Dr. Gloriann Loan outpatient although currently hospitalized. ?Due to concern for development of neurogenic bladder would recommend to continue Foley catheter on discharge at this time around as well. ? ?Avascular necrosis of femoral head, right ([place]) ?Noted incidentally on CT imaging. No subchondral collapse. Asymptomatic. ? ?CAD (coronary artery disease) ?Patient is on Aspirin, Plavix, Ranexa, Crestor as an outpatient. No current chest pain.  ? ?Sick sinus syndrome ([place]) ?S/p pacemaker, [place] MR conditional pacemaker ? ?Essential hypertension ?Patient is not on medication management as an outpatient. ? ?Controlled type 2 diabetes mellitus without complication, without long-term current use of insulin ([place]) ?Metformin discontinued at prior hospitalization secondary to diarrhea. Most recent hemoglobin A1C of 5.4%. Well controlled. ? ?COPD (chronic obstructive pulmonary disease) ([place]) ?Currently asymptomatic.  Continue Yupelri ? ?Obesity (BMI 30-39.9) ?Body mass index is 31.01 kg/m?Marland Kitchen  ?Placing the pt at higher risk of poor outcomes.  ? ?AAA (abdominal aortic aneurysm) ([place]) ?Infrarenal. History of smoking. Currently measuring 3.3 cm. Recommendation for follow-up every 3 years. ? ? ?Subjective: No nausea no vomiting no fever no chills.  Reports dark-colored urination.  No new focal deficit.  Dizziness improving.  Unable to sense when he has a bowel movement. ? ?Physical Exam: ?Vitals:  ? 04/02/22 0235 04/02/22 0604 04/02/22 0900 04/02/22 1500  ?BP: 102/62 (!) 94/59  (!)  130/20  ?Pulse: 72 66 63 80  ?Resp: '18 18 18 16  '$ ?Temp: 98.6 ?F (37 ?C) 98.1 ?F (36.7 ?C)  98 ?F (36.7 ?C)  ?TempSrc: Oral Oral  Oral  ?SpO2: 93% 96% 95% 90%  ?Weight:      ?Height:      ? ?General: Appear in mild distress; no visible Abnormal Neck Mass Or lumps, Conjunctiva normal ?Cardiovascular: S1 and S2 Present, no Murmur, ?Respiratory: good respiratory effort, Bilateral Air entry present and CTA, no Crackles, no wheezes ?Abdomen: Bowel Sound present, Non tender  ?Extremities: trace Pedal edema ?Neurology: alert and oriented to time, place, and person, right lower extremity weakness ?Gait not checked due to patient safety concerns  ? ?Data Reviewed: ?I have Reviewed nursing notes, Vitals, and Lab results since pt's last encounter. Pertinent lab results CBC and BMP ?I have ordered test including CBC and BMP ?I have discussed pt's care plan and test results with neurology.  ? ?Family Communication: Wife at bedside ? ?Disposition: ?Status is: Inpatient ?Remains inpatient appropriate because: Close observation before going to CIR for biopsy and recovery from biopsy. ? ?Author: ?Austin Mull, MD ?04/02/2022 5:51 PM ? ?Please look on [email or web address] to find out who is on call. ?

## 2022-04-02 NOTE — Progress Notes (Signed)
Physical Therapy Treatment ?Patient Details ?Name: Austin Oliver ?MRN: [id number] ?DOB: 1956-06-06 ?Today's Date: 04/02/2022 ? ? ?History of Present Illness The pt is a 66 yo male presenting 03/25/22 with recurrent falls and worsening B LE weakenss, R>L. CT suggestive of demylenating process vs microvascular ischemic disease. 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 ? ?  ?PT Comments  ? ? Pt required mod assist bed mobility, mod assist sit to stand, and dependent stedy transfer bed to recliner. Static standing trials in stedy x 3. 30 seconds/trial. Pt performed LE exercises in recliner with feet elevated. A/AAROM LLE and AA/PROM RLE. ?   ?Recommendations for follow up therapy are one component of a multi-disciplinary discharge planning process, led by the attending physician.  Recommendations may be updated based on patient status, additional functional criteria and insurance authorization. ? ?Follow Up Recommendations ? Acute inpatient rehab (3hours/day) ?  ?  ?Assistance Recommended at Discharge Frequent or constant Supervision/Assistance  ?Patient can return home with the following Two people to help with walking and/or transfers;A lot of help with bathing/dressing/bathroom;Assistance with cooking/housework;Help with stairs or ramp for entrance;Assist for transportation ?  ?Equipment Recommendations ? Wheelchair (measurements PT);Wheelchair cushion (measurements PT)  ?  ?Recommendations for Other Services   ? ? ?  ?Precautions / Restrictions Precautions ?Precautions: Fall  ?  ? ?Mobility ? Bed Mobility ?Overal bed mobility: Needs Assistance ?Bed Mobility: Supine to Sit ?  ?  ?Supine to sit: HOB elevated, Mod assist ?  ?  ?General bed mobility comments: +rail, increased time, assist with RLE and to elevate trunk. Use of  bed pad to scoot to EOB ?  ? ?Transfers ?Overall transfer level: Needs assistance ?  ?Transfers: Sit to/from Stand, Bed to chair/wheelchair/BSC ?Sit to Stand: +2 safety/equipment, Mod assist ?  ?  ?  ?  ?  ?General transfer comment: sit to stand x 3 trials in stedy: from elevated bed, from stedy seat, and from recliner. Good control with descent. ?Transfer via Lift Equipment: Stedy ? ?Ambulation/Gait ?  ?  ?  ?  ?  ?  ?  ?  ? ? ?Stairs ?  ?  ?  ?  ?  ? ? ?Wheelchair Mobility ?  ? ?Modified Rankin (Stroke Patients Only) ?Modified Rankin (Stroke Patients Only) ?Pre-Morbid Rankin Score: Moderate disability ?Modified Rankin: Moderately severe disability ? ? ?  ?Balance Overall balance assessment: History of Falls, Needs assistance ?Sitting-balance support: Feet supported, Single extremity supported ?Sitting balance-Leahy Scale: Poor ?Sitting balance - Comments: Requiring stabilization with one hand on bedrail to maintain EOB balance ?  ?Standing balance support: Bilateral upper extremity supported, During functional activity ?Standing balance-Leahy Scale: Zero ?Standing balance comment: dependent on stedy and therapist for static stand. Standing trials x 3. Approx 30 seconds/trial. ?  ?  ?  ?  ?  ?  ?  ?  ?  ?  ?  ?  ? ?  ?Cognition Arousal/Alertness: Awake/alert ?Behavior During Therapy: [facility] for tasks assessed/performed ?Overall Cognitive Status: Within Functional Limits for tasks assessed ?  ?  ?  ?  ?  ?  ?  ?  ?  ?  ?  ?  ?  ?  ?  ?  ?General Comments: Very witty. Likes to joke. ?  ?  ? ?  ?  Exercises General Exercises - Lower Extremity ?Ankle Circles/Pumps: AROM, PROM, Right, Left, 10 reps ?Heel Slides: AAROM, PROM, Right, Left, 5 reps ?Hip ABduction/ADduction: AROM, AAROM, Right, Left, 10 reps ? ?  ?General Comments   ?  ?  ? ?Pertinent Vitals/Pain Pain Assessment ?Pain Assessment: Faces ?Faces Pain Scale: Hurts a little bit ?Pain Location: R knee ?Pain Descriptors / Indicators: Guarding, Discomfort ?Pain  Intervention(s): Monitored during session, Repositioned  ? ? ?Home Living   ?  ?  ?  ?  ?  ?  ?  ?  ?  ?   ?  ?Prior Function    ?  ?  ?   ? ?PT Goals (current goals can now be found in the care plan section) Acute Rehab PT Goals ?Patient Stated Goal: to get stronger ?Progress towards PT goals: Progressing toward goals ? ?  ?Frequency ? ? ? Min 3X/week ? ? ? ?  ?PT Plan Current plan remains appropriate  ? ? ?Co-evaluation   ?  ?  ?  ?  ? ?  ?AM-PAC PT "6 Clicks" Mobility   ?Outcome Measure ? Help needed turning from your back to your side while in a flat bed without using bedrails?: A Little ?Help needed moving from lying on your back to sitting on the side of a flat bed without using bedrails?: A Lot ?Help needed moving to and from a bed to a chair (including a wheelchair)?: Total ?Help needed standing up from a chair using your arms (e.g., wheelchair or bedside chair)?: A Lot ?Help needed to walk in hospital room?: Total ?Help needed climbing 3-5 steps with a railing? : Total ?6 Click Score: 10 ? ?  ?End of Session Equipment Utilized During Treatment: Gait belt ?Activity Tolerance: Patient tolerated treatment well ?Patient left: in chair;with call bell/phone within reach;with chair alarm set;with family/visitor present ?Nurse Communication: Mobility status ?PT Visit Diagnosis: Muscle weakness (generalized) (M62.81);Other abnormalities of gait and mobility (R26.89);Other symptoms and signs involving the nervous system (R29.898) ?  ? ? ?Time: 8270-7867 ?PT Time Calculation (min) (ACUTE ONLY): 30 min ? ?Charges:  $Therapeutic Exercise: 8-22 mins ?$Therapeutic Activity: 8-22 mins          ?          ? ?Lorrin Goodell, PT  ?Office # [phone] ?Pager [phone] ? ? ? ?Lorriane Shire ?04/02/2022, 12:42 PM ? ?

## 2022-04-02 NOTE — Progress Notes (Signed)
Occupational Therapy Treatment ?Patient Details ?Name: Austin Oliver ?MRN: [id number] ?DOB: [birth year] ?Today's Date: 04/02/2022 ? ? ?History of present illness The pt is a 66 yo male presenting 03/25/22 with recurrent falls and worsening B LE weakenss, R>L. CT suggestive of demylenating process vs microvascular ischemic disease. 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 ?  ?OT comments ? Pt progressing towards goals, pt up in chair seen shortly after PT session. Pt wanting to get washed up, able to complete UB/LB bathing with minA to reach feet/back. Pt anxious with reaching/leaning outside BOS in fear of falling. Pt max A for LB dressing and set up for UB dressing. Pt able to perform UB exercises using green theraband with therapist demonstration, encouraged pt to use for UB strengthening. Pt verbalized understanding. Pt presenting with impairments listed below, will follow acutely. Continue to recommend AIR at d/c.  ? ?Recommendations for follow up therapy are one component of a multi-disciplinary discharge planning process, led by the attending physician.  Recommendations may be updated based on patient status, additional functional criteria and insurance authorization. ?   ?Follow Up Recommendations ? Acute inpatient rehab (3hours/day)  ?  ?Assistance Recommended at Discharge Frequent or constant Supervision/Assistance  ?Patient can return home with the following ? Two people to help with walking and/or transfers;A lot of help with bathing/dressing/bathroom;Assist for transportation;Help with stairs or ramp for entrance ?  ?Equipment Recommendations ? None recommended by OT;Other (comment) (defer to next venue of care)  ?  ?Recommendations for Other Services   ? ?  ?Precautions / Restrictions Precautions ?Precautions:  Fall ?Restrictions ?Weight Bearing Restrictions: No  ? ? ?  ? ?Mobility Bed Mobility ?  ?  ?  ?  ?  ?  ?  ?General bed mobility comments: OOB in chair upon arrival ?  ? ?Transfers ?  ?  ?  ?  ?  ?  ?  ?  ?  ?  ?  ?  ?Balance Overall balance assessment: History of Falls, Needs assistance ?Sitting-balance support: Feet supported, Single extremity supported ?Sitting balance-Leahy Scale: Fair ?Sitting balance - Comments: can sit up in chair without back support and reach outside BOS without LOB ?  ?  ?  ?  ?  ?  ?  ?  ?  ?  ?  ?  ?  ?  ?  ?   ? ?ADL either performed or assessed with clinical judgement  ? ?ADL Overall ADL's : Needs assistance/impaired ?  ?  ?Grooming: Brushing hair;Sitting ?  ?Upper Body Bathing: Sitting;Minimal assistance ?Upper Body Bathing Details (indicate cue type and reason): min A to wash back ?Lower Body Bathing: Minimal assistance;Sitting/lateral leans;Sit to/from stand ?Lower Body Bathing Details (indicate cue type and reason): to wash feet ?Upper Body Dressing : Set up;Sitting ?Upper Body Dressing Details (indicate cue type and reason): to don gown ?Lower Body Dressing: Maximal assistance;Sit to/from stand;Sitting/lateral leans ?Lower Body Dressing Details (indicate cue type and reason): to don socks ?  ?  ?  ?  ?  ?  ?  ?  ?  ? ?Extremity/Trunk Assessment Upper Extremity Assessment ?Upper Extremity Assessment: Overall WFL for tasks assessed ?  ?Lower Extremity Assessment ?Lower Extremity Assessment: Defer to PT evaluation ?  ?  ?  ? 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  ?Perception Perception ?Perception: Not tested ?  ?Praxis Praxis ?Praxis: Not tested ?  ? ?Cognition Arousal/Alertness: Awake/alert ?Behavior During Therapy: [facility] for tasks assessed/performed ?Overall Cognitive Status: Within Functional Limits for tasks assessed ?  ?  ?  ?  ?  ?  ?  ?  ?  ?  ?  ?  ?  ?  ?  ?  ?General Comments:  HOH ?  ?  ?   ?Exercises Exercises: Other exercises ?General Exercises - Upper Extremity ?Shoulder Flexion: Theraband, Both, 5 reps, Seated ?Theraband Level (Shoulder Flexion): Level 3 (Green) ?Elbow Flexion: Both, 10 reps, AROM, Theraband, Seated ?Theraband Level (Elbow Flexion): Level 3 (Green) ? ?  ?Shoulder Instructions   ? ? ?  ?General Comments VSS on RA  ? ? ?Pertinent Vitals/ Pain       Pain Assessment ?Pain Assessment: Faces ?Pain Score: 2  ?Faces Pain Scale: Hurts a little bit ?Pain Location: R knee ?Pain Descriptors / Indicators: Guarding, Discomfort ?Pain Intervention(s): Limited activity within patient's tolerance, Monitored during session ? ?Home Living   ?  ?  ?  ?  ?  ?  ?  ?  ?  ?  ?  ?  ?  ?  ?  ?  ?  ?  ? ?  ?Prior Functioning/Environment    ?  ?  ?  ?   ? ?Frequency ? Min 2X/week  ? ? ? ? ?  ?Progress Toward Goals ? ?OT Goals(current goals can now be found in the care plan section) ? Progress towards OT goals: Progressing toward goals ? ?Acute Rehab OT Goals ?Patient Stated Goal: none stated ?OT Goal Formulation: With patient ?Time For Goal Achievement: 04/09/22 ?Potential to Achieve Goals: Good ?ADL Goals ?Pt Will Perform Lower Body Bathing: with min guard assist;sitting/lateral leans;with adaptive equipment ?Pt Will Perform Lower Body Dressing: with supervision;sitting/lateral leans;with adaptive equipment ?Pt Will Transfer to Toilet: with supervision;with transfer board;bedside commode ?Pt Will Perform Toileting - Clothing Manipulation and hygiene: with supervision;sitting/lateral leans ?Additional ADL Goal #1: Pt will stand with moderate assist as a precursor to ADLs.  ?Plan Discharge plan remains appropriate   ? ?Co-evaluation ? ? ?   ?  ?  ?  ?  ? ?  ?AM-PAC OT "6 Clicks" Daily Activity     ?Outcome Measure ? ? Help from another person eating meals?: None ?Help from another person taking care of personal grooming?: A Little ?Help from another person toileting, which includes using toliet,  bedpan, or urinal?: A Lot ?Help from another person bathing (including washing, rinsing, drying)?: A Lot ?Help from another person to put on and taking off regular upper body clothing?: A Little ?Help from another person to put on and taking off regular lower body clothing?: A Lot ?6 Click Score: 16 ? ?  ?End of Session   ? ?OT Visit Diagnosis: Unsteadiness on feet (R26.81);Other abnormalities of gait and mobility (R26.89);Muscle weakness (generalized) (M62.81);History of falling (Z91.81);Pain ?  ?Activity Tolerance Patient tolerated treatment well ?  ?Patient Left in chair;with call bell/phone within reach;with chair alarm set ?  ?Nurse Communication Mobility status ?  ? ?   ? ?Time: 1610-9604 ?OT Time Calculation (min): 34 min ? ?Charges: OT General Charges ?$OT Visit: 1 Visit ?OT Treatments ?$Self Care/Home Management : 23-37 mins ? ?Lynnda Child, OTD, OTR/L ?Acute Rehab ?[phone] ? ? ?Austin Oliver  Philbert Riser ?04/02/2022, 4:31 PM ?

## 2022-04-03 DIAGNOSIS — C801 Malignant (primary) neoplasm, unspecified: Secondary | ICD-10-CM | POA: Diagnosis present

## 2022-04-03 DIAGNOSIS — G379 Demyelinating disease of central nervous system, unspecified: Secondary | ICD-10-CM | POA: Diagnosis present

## 2022-04-03 DIAGNOSIS — C7951 Secondary malignant neoplasm of bone: Secondary | ICD-10-CM | POA: Diagnosis present

## 2022-04-03 DIAGNOSIS — R161 Splenomegaly, not elsewhere classified: Secondary | ICD-10-CM | POA: Diagnosis present

## 2022-04-03 LAB — CBC
HCT: 32.9 % — ABNORMAL LOW (ref 39.0–52.0)
Hemoglobin: 11.1 g/dL — ABNORMAL LOW (ref 13.0–17.0)
MCH: 29.7 pg (ref 26.0–34.0)
MCHC: 33.7 g/dL (ref 30.0–36.0)
MCV: 88 fL (ref 80.0–100.0)
Platelets: 125 10*3/uL — ABNORMAL LOW (ref 150–400)
RBC: 3.74 MIL/uL — ABNORMAL LOW (ref 4.22–5.81)
RDW: 15.9 % — ABNORMAL HIGH (ref 11.5–15.5)
WBC: 3.3 10*3/uL — ABNORMAL LOW (ref 4.0–10.5)
nRBC: 0 % (ref 0.0–0.2)

## 2022-04-03 LAB — BASIC METABOLIC PANEL
Anion gap: 9 (ref 5–15)
BUN: 21 mg/dL (ref 8–23)
CO2: 27 mmol/L (ref 22–32)
Calcium: 9 mg/dL (ref 8.9–10.3)
Chloride: 98 mmol/L (ref 98–111)
Creatinine, Ser: 1.16 mg/dL (ref 0.61–1.24)
GFR, Estimated: >60 mL/min (ref >60)
Glucose, Bld: 125 mg/dL — ABNORMAL HIGH (ref 70–99)
Potassium: 4.2 mmol/L (ref 3.5–5.1)
Sodium: 134 mmol/L — ABNORMAL LOW (ref 135–145)

## 2022-04-03 LAB — MAGNESIUM: Magnesium: 1.9 mg/dL (ref 1.7–2.4)

## 2022-04-03 MED ORDER — POLYETHYLENE GLYCOL 3350 17 G PO PACK: 17.0000 g | PACK | Freq: Every day | ORAL | Status: DC | PRN

## 2022-04-03 MED ORDER — CHLORHEXIDINE GLUCONATE CLOTH 2 % EX PADS
6.0000 | MEDICATED_PAD | Freq: Every day | CUTANEOUS | Status: DC
  Administered 2022-04-03 – 2022-04-04 (×2): 6 via TOPICAL

## 2022-04-03 NOTE — Assessment & Plan Note (Signed)
MRI brain w/wo contrast showed multifocal chronic infarcts.  Radiology suggested appearance might be consistent with demyelinating disease however per Neurology these lesions showed restricted diffusion on prior admissions c/w acute multifocal ischemic infarcts. Thus no further work up recommended.

## 2022-04-03 NOTE — Progress Notes (Signed)
Nutrition Follow-up  DOCUMENTATION CODES:  Not applicable  INTERVENTION:  Continue current diet as ordered, encourage PO intake Glucerna Shake po TID, each supplement provides 220 kcal and 10 grams of protein MVI with minerals daily  NUTRITION DIAGNOSIS:  Increased nutrient needs related to acute illness (cirrhosis, COPD, frequent hospitalizations) as evidenced by estimated needs. - remains applicable  GOAL:  Patient will meet greater than or equal to 90% of their needs - progressing, diet in place with supplements  MONITOR:  PO intake, Labs, I & O's, Supplement acceptance  REASON FOR ASSESSMENT:  Malnutrition Screening Tool    ASSESSMENT:  Pt with hx of recurrent strokes, CAD, COPD, cirrhosis, HTN, HLD, GERD, DM type 2, and hypothyroidism presented to ED with worsening weakness.  Pt underwent MRI which showed nodules consistent with possible microadenoma. Follow-up CT showed masses of the liver, adrenal glands, abdominal lymphadenopathy. Scheduled to undergo liver biopsy 5/19.    Pt sleeping with blankets over his head at the time of assessment. No family at bedside this AM. Reviewed intake since last assessment, appears to be good and pt is routinely accepting glucerna drinks. Will continue current plan of care for now.  Noted slightly elevated copper - could be related to liver dysfunction with mass.  Average Meal Intake: 5/15: 100% intake x 1 recorded meals  Nutritionally Relevant Medications: Scheduled Meds:  ezetimibe  10 mg Oral q AM   GLUCERNA SHAKE  237 mL Oral BID BM   multivitamin with minerals  1 tablet Oral Daily   pantoprazole  40 mg Oral QPC supper   PRN Meds: senna-docusate  Labs Reviewed: Sodium 134  5/10 Vitamin Labs: Copper: 141 (high) Ceruloplasmin: 28.7 Vitamin B12: 636 Zinc: 68 Methylmalonic Acid: 123 Folate: 7.5  NUTRITION - FOCUSED PHYSICAL EXAM: Flowsheet Row Most Recent Value  Orbital Region No depletion  Thoracic and Lumbar Region  No depletion  Buccal Region No depletion  Temple Region Mild depletion  Clavicle Bone Region No depletion  Clavicle and Acromion Bone Region No depletion  Scapular Bone Region No depletion  Dorsal Hand Mild depletion  Patellar Region No depletion  Anterior Thigh Region No depletion  Posterior Calf Region Mild depletion  Edema (RD Assessment) None  Hair Reviewed  Eyes Reviewed  Mouth Reviewed  Skin Reviewed  Nails Reviewed   Diet Order:   Diet Order             Diet NPO time specified Except for: Sips with Meds  Diet effective midnight           Diet 2 gram sodium Room service appropriate? Yes; Fluid consistency: Thin  Diet effective now                   EDUCATION NEEDS:  Education needs have been addressed  Skin:  Skin Assessment: Reviewed RN Assessment  Last BM:  5/17 - type 6  Height:  Ht Readings from Last 1 Encounters:  03/25/22 '5\' 9"'$  (1.753 m)    Weight:  Wt Readings from Last 1 Encounters:  03/25/22 95.3 kg    Ideal Body Weight:  72.7 kg  BMI:  Body mass index is 31.01 kg/m.  Estimated Nutritional Needs:  Kcal:  2000-2200 kcal/d Protein:  100-115g/d Fluid:  2-2.1 L/d    Ranell Patrick, RD, LDN Clinical Dietitian RD pager # available in [place]  After hours/weekend pager # available in [facility]

## 2022-04-03 NOTE — Progress Notes (Signed)
Inpatient Rehabilitation Admissions Coordinator   I continue to await appeal with [place] for possible [facility] admit.  Danne Baxter, RN, MSN Rehab Admissions Coordinator [phone] 04/03/2022 11:49 AM

## 2022-04-03 NOTE — Assessment & Plan Note (Deleted)
Abnormal T1 signal intensity posterior aspect L3 and L4 and and most of the L5 and S1 vertebral bodies with Contrast enhancement, also in the T4 vertebral body without destructive bony lesions.  Not amenable to biopsy.

## 2022-04-03 NOTE — Progress Notes (Signed)
Progress Note Patient: Austin Oliver:[id number] DOB: Dec 24, 1955 DOA: 03/25/2022  DOS: the patient was seen and examined on 04/03/2022  Brief hospital course: Austin Oliver is a 66 y.o. male with medical history significant of recurrent strokes, CAD, COPD, sick sinus syndrome s/p pacemaker placement, cirrhosis with recent admission for stroke with right lower extremity weakness with concern for possible demyelination disease but more likely spinal cord infarct. Also with evidence of metastatic cancer; liver biopsy pending.  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  Neurology was consulted,  MRI brain/cervical spine with contrast confirm previously noted concern for demyelination with possible pituitary adenoma.  MRI thoracic/lumbar spine significant for abnormal cord signal at T3 and T4 levels suggestive of cord ischemia vs transverse myelitis.  Neurology concern mostly for spinal cord infarct leading to symptoms secondary to hypercoagulable state from presumed metastatic cancer.  PT recommending inpatient rehabilitation on discharge.  Currently aspirin and Plavix on hold. On Crestor 5 mg and Zetia.  suspected Demyelinating changes in brain [facility]) MRI brain w/wo contrast showed multifocal chronic infarcts.  Radiology suggested appearance might be consistent with demyelinating disease however per Neurology these lesions showed restricted diffusion on prior admissions c/w acute multifocal ischemic infarcts. Thus no further work up recommended.   History of CVA (cerebrovascular accident) 11/2021  bicerebral multiple embolic infarcts of cryptogenic etiology in the setting of acute COVID infection. Patient is on Aspirin/Plavix, Crestor, Ranexa and Zetia as an outpatient. Plavix and aspirin recommended to be held by IR for biopsy. 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  CT chest/abd/pelvis with masses of the liver, masses of the adrenal glands, abdominal lymphadenopathy. There is also concern for cirrhosis of the liver based on [place] in 2022. Abnormal T1 signal intensity posterior aspect L3 and L4 and and most of the L5 and S1 vertebral bodies with Contrast enhancement, also in the T4 vertebral body without destructive bony lesions.  Not amenable to biopsy.   -IR consult for biopsy (Delayed secondary to Aspirin/Plavix). Plan for biopsy on 5/19.   Pituitary microadenoma ([place]) MRI brain (5/10) significant for 5 mm hypoenhancing nodule in anterior pituitary consistent with possible micro adenoma. No clinical features currently concerning for hyperfunctioning lesion at this time.   Recurrent falls In setting of recent stroke and concern for demyelinating disease. PT/OT ordered and recommend inpatient rehabiliation.  Chronic retention of urine, due to BPH Chronic issue. Foley exchanged on admission, 5/9. Urinalysis not suggestive of infection. -Continue Foley, finasteride and Flomax. Supposed to follow-up with Dr. Gloriann Loan outpatient although currently hospitalized. Due to concern for development of neurogenic bladder would recommend to continue Foley catheter on discharge at this time around as well.  Avascular necrosis of femoral head, right ([place]) Noted incidentally on CT imaging. No subchondral collapse. Asymptomatic.  CAD (coronary artery disease) Patient is on Aspirin, Plavix, Ranexa, Crestor as an outpatient. No current chest pain.   Sick sinus syndrome [facility]) S/p pacemaker, [place] MR conditional pacemaker  Essential hypertension Patient is not on medication management as an outpatient.  Controlled type 2 diabetes mellitus without complication, without long-term current use of insulin  (HCC) Metformin discontinued at prior hospitalization secondary to diarrhea. Most recent hemoglobin A1C of 5.4%. Well controlled.  COPD (chronic obstructive pulmonary disease) (HCC) Currently asymptomatic.  Continue Maretta Bees  Obesity (BMI 30-39.9) Body mass index is 31.01 kg/m.  Placing the pt at higher risk of poor outcomes.   AAA (abdominal aortic aneurysm) (HCC) Infrarenal. History of smoking. Currently measuring 3.3 cm. Recommendation for follow-up every 3 years.  Subjective: Reports right sided flank pain.  Also reports numbness in his pelvic area unchanged.  Weakness on the right unchanged.  Has some swelling on both legs.  Physical Exam: Vitals:   04/03/22 0050 04/03/22 0413 04/03/22 0749 04/03/22 1324  BP: 103/71 109/66 (!) 98/57 106/64  Pulse: 73 64 67 67  Resp: '14 20  17  '$ Temp: 99.1 F (37.3 C) 98.3 F (36.8 C) 97.8 F (36.6 C) 99.7 F (37.6 C)  TempSrc: Oral Oral Oral Oral  SpO2: 95% 95% 97% 96%  Weight:      Height:       General: Appear in mild distress; no visible Abnormal Neck Mass Or lumps, Conjunctiva normal Cardiovascular: S1 and S2 Present, no Murmur, Respiratory: good respiratory effort, Bilateral Air entry present and CTA, no Crackles, no wheezes Abdomen: Bowel Sound present, Non tender  Extremities: no Pedal edema Neurology: alert and oriented to time, place, and person Gait not checked due to patient safety concerns   Data Reviewed: I have Reviewed nursing notes, Vitals, and Lab results since pt's last encounter. Pertinent lab results CBC and BMP    Family Communication: None at bedside  Disposition: Status is: Inpatient Remains inpatient appropriate because: Biopsy tomorrow monitor for recovery after that.  Author: Berle Mull, MD 04/03/2022 6:53 PM  Please look on [email or web address] to find out who is on call.

## 2022-04-03 NOTE — Progress Notes (Signed)
This chaplain responded to the consult for creating the Pt. Advance Directive. The Pt. is awake and agrees to participate in AD: HCPOA and Living Will education with clarifying questions. The Pt. filled out the document and is waiting on a notary visit on Friday. The Pt. AD document is on the window sill.  The Pt. communicated he may have a procedure on Friday. The Pt. is hopeful the notary will visit before the procedure; a secure chat to the chaplain with a possible time will be helpful.    Chaplain Sallyanne Kuster [phone]

## 2022-04-03 NOTE — Progress Notes (Signed)
Physical Therapy Treatment Patient Details Name: Austin Oliver MRN: [id number] DOB: [birth year] Today's Date: 04/03/2022   History of Present Illness The pt is a 66 yo male presenting 03/25/22 with recurrent falls and worsening B LE weakenss, R>L. CT suggestive of demylenating process vs microvascular ischemic disease. 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    PT Comments    Pt making steady progress toward goals, limited by R >L weakness.  Emphasis on transitions, scooting, balance, sit to stands and standing activity before scoot transfer to the chair.    Recommendations for follow up therapy are one component of a multi-disciplinary discharge planning process, led by the attending physician.  Recommendations may be updated based on patient status, additional functional criteria and insurance authorization.  Follow Up Recommendations  Acute inpatient rehab (3hours/day)     Assistance Recommended at Discharge Frequent or constant Supervision/Assistance  Patient can return home with the following Two people to help with walking and/or transfers;A lot of help with bathing/dressing/bathroom;Assistance with cooking/housework;Help with stairs or ramp for entrance;Assist for transportation   Equipment Recommendations  Wheelchair (measurements PT);Wheelchair cushion (measurements PT)    Recommendations for Other Services       Precautions / Restrictions Precautions Precautions: Fall Restrictions Weight Bearing Restrictions: No     Mobility  Bed Mobility Overal bed mobility: Needs Assistance Bed Mobility: Rolling, Sidelying to Sit Rolling: Min assist Sidelying to sit: Min assist       General bed mobility comments: up via R elbow and scooted to EOB all with min.     Transfers Overall transfer level: Needs assistance   Transfers: Sit to/from Stand, Bed to chair/wheelchair/BSC Sit to Stand: +2 safety/equipment, Mod assist          Lateral/Scoot Transfers: Min assist General transfer comment: sit to stand x2 for up to 3+ min each trial in standing during peri care and working on upright stand, w/shifts.  scoot transfer to the L with minimal boost to bridge the gap between the chair and bed. (xith 2-3 min  in standing.) Transfer via Lift Equipment:  (EVA walker)  Ambulation/Gait                   Stairs             Wheelchair Mobility    Modified Rankin (Stroke Patients Only) Modified Rankin (Stroke Patients Only) Modified Rankin: Moderately severe disability     Balance   Sitting-balance support: Single extremity supported, No upper extremity supported, Feet supported Sitting balance-Leahy Scale: Fair Sitting balance - Comments: can not accept challenge, able to sit without UE assist.   Standing balance support: Bilateral upper extremity supported, During functional activity Standing balance-Leahy Scale: Zero Standing balance comment: up in the EVA walker, working on upright stand, knee control.                            Cognition Arousal/Alertness: Awake/alert Behavior During Therapy: WFL for tasks assessed/performed Overall Cognitive Status: Within Functional Limits for tasks assessed                                          Exercises  Other Exercises Other Exercises: hip/knee flex/ext ROM with graded active assist and resistance L LE and PROM to the R LE with trace extension/add at hip on the R.    General Comments        Pertinent Vitals/Pain Pain Assessment Pain Assessment: Faces Faces Pain Scale: Hurts little more Pain Location: R knee Pain Descriptors / Indicators: Guarding, Discomfort Pain Intervention(s): Monitored during session    Home Living                           Prior Function            PT Goals (current goals can now be found in the care plan section) Acute Rehab PT Goals Patient Stated Goal: to get stronger PT Goal Formulation: With patient Time For Goal Achievement: 04/09/22 Potential to Achieve Goals: Good    Frequency    Min 3X/week      PT Plan Current plan remains appropriate    Co-evaluation              AM-PAC PT "6 Clicks" Mobility   Outcome Measure  Help needed turning from your back to your side while in a flat bed without using bedrails?: A Little Help needed moving from lying on your back to sitting on the side of a flat bed without using bedrails?: A Lot Help needed moving to and from a bed to a chair (including a wheelchair)?: A Little Help needed standing up from a chair using your arms (e.g., wheelchair or bedside chair)?: A Lot Help needed to walk in hospital room?: Total Help needed climbing 3-5 steps with a railing? : Total 6 Click Score: 12    End of Session   Activity Tolerance: Patient tolerated treatment well Patient left: in chair;with call bell/phone within reach;with chair alarm set;with family/visitor present Nurse Communication: Mobility status PT Visit Diagnosis: Muscle weakness (generalized) (M62.81);Other abnormalities of gait and mobility (R26.89);Other symptoms and signs involving the nervous system (R29.898)     Time: 6834-1962 PT Time Calculation (min) (ACUTE ONLY): 38 min  Charges:  $Therapeutic Activity: 23-37 mins $Neuromuscular Re-education: 8-22 mins                     04/03/2022  Ginger Carne., PT Acute Rehabilitation Services [phone]  (pager) [phone]  (office)   Tessie Fass Baylyn Sickles 04/03/2022, 5:59 PM

## 2022-04-03 NOTE — Assessment & Plan Note (Addendum)
Suspected lymphoproliferative disorder Chronic Pancytopenia  Splenomegaly  Spinal lesions Liver cirrhosis Concern for metastatic cancer.  CT chest/abd/pelvis with masses of the liver, masses of the adrenal glands, abdominal lymphadenopathy. There is also concern for cirrhosis of the liver based on [place] in 2022. Abnormal T1 signal intensity posterior aspect L3 and L4 and and most of the L5 and S1 vertebral bodies with Contrast enhancement, also in the T4 vertebral body without destructive bony lesions.  Not amenable to biopsy.   -IR consult for biopsy (Delayed secondary to Aspirin/Plavix and scheduling issues). Plan for biopsy on 5/22.

## 2022-04-04 ENCOUNTER — Inpatient Hospital Stay (HOSPITAL_COMMUNITY): Payer: Medicare Other

## 2022-04-04 ENCOUNTER — Encounter (HOSPITAL_COMMUNITY): Payer: Self-pay | Admitting: Physical Medicine and Rehabilitation

## 2022-04-04 ENCOUNTER — Inpatient Hospital Stay (HOSPITAL_COMMUNITY)
Admission: RE | Admit: 2022-04-04 | Discharge: 2022-04-15 | DRG: 052 | Disposition: A | Discharge status: Other Acute Inpatient Hospital | Payer: Medicare Other | Source: Intra-hospital | Attending: Physical Medicine and Rehabilitation | Admitting: Physical Medicine and Rehabilitation

## 2022-04-04 ENCOUNTER — Other Ambulatory Visit: Payer: Self-pay

## 2022-04-04 DIAGNOSIS — C7951 Secondary malignant neoplasm of bone: Secondary | ICD-10-CM | POA: Diagnosis not present

## 2022-04-04 DIAGNOSIS — E871 Hypo-osmolality and hyponatremia: Secondary | ICD-10-CM | POA: Diagnosis not present

## 2022-04-04 DIAGNOSIS — Z96651 Presence of right artificial knee joint: Secondary | ICD-10-CM | POA: Diagnosis present

## 2022-04-04 DIAGNOSIS — Z87891 Personal history of nicotine dependence: Secondary | ICD-10-CM

## 2022-04-04 DIAGNOSIS — Z888 Allergy status to other drugs, medicaments and biological substances status: Secondary | ICD-10-CM

## 2022-04-04 DIAGNOSIS — G8222 Paraplegia, incomplete: Secondary | ICD-10-CM | POA: Diagnosis present

## 2022-04-04 DIAGNOSIS — K5900 Constipation, unspecified: Secondary | ICD-10-CM | POA: Diagnosis not present

## 2022-04-04 DIAGNOSIS — I2510 Atherosclerotic heart disease of native coronary artery without angina pectoris: Secondary | ICD-10-CM | POA: Diagnosis present

## 2022-04-04 DIAGNOSIS — K219 Gastro-esophageal reflux disease without esophagitis: Secondary | ICD-10-CM | POA: Diagnosis present

## 2022-04-04 DIAGNOSIS — K7460 Unspecified cirrhosis of liver: Secondary | ICD-10-CM | POA: Diagnosis present

## 2022-04-04 DIAGNOSIS — C9110 Chronic lymphocytic leukemia of B-cell type not having achieved remission: Secondary | ICD-10-CM | POA: Diagnosis present

## 2022-04-04 DIAGNOSIS — Z79899 Other long term (current) drug therapy: Secondary | ICD-10-CM

## 2022-04-04 DIAGNOSIS — N318 Other neuromuscular dysfunction of bladder: Secondary | ICD-10-CM | POA: Diagnosis present

## 2022-04-04 DIAGNOSIS — J449 Chronic obstructive pulmonary disease, unspecified: Secondary | ICD-10-CM | POA: Diagnosis present

## 2022-04-04 DIAGNOSIS — R630 Anorexia: Secondary | ICD-10-CM | POA: Diagnosis not present

## 2022-04-04 DIAGNOSIS — R7989 Other specified abnormal findings of blood chemistry: Secondary | ICD-10-CM | POA: Diagnosis present

## 2022-04-04 DIAGNOSIS — D649 Anemia, unspecified: Secondary | ICD-10-CM | POA: Diagnosis not present

## 2022-04-04 DIAGNOSIS — G9511 Acute infarction of spinal cord (embolic) (nonembolic): Secondary | ICD-10-CM | POA: Diagnosis not present

## 2022-04-04 DIAGNOSIS — R112 Nausea with vomiting, unspecified: Secondary | ICD-10-CM | POA: Diagnosis not present

## 2022-04-04 DIAGNOSIS — D696 Thrombocytopenia, unspecified: Secondary | ICD-10-CM | POA: Diagnosis present

## 2022-04-04 DIAGNOSIS — E43 Unspecified severe protein-calorie malnutrition: Secondary | ICD-10-CM | POA: Diagnosis not present

## 2022-04-04 DIAGNOSIS — M25561 Pain in right knee: Secondary | ICD-10-CM | POA: Diagnosis present

## 2022-04-04 DIAGNOSIS — Z95810 Presence of automatic (implantable) cardiac defibrillator: Secondary | ICD-10-CM | POA: Diagnosis not present

## 2022-04-04 DIAGNOSIS — Z955 Presence of coronary angioplasty implant and graft: Secondary | ICD-10-CM

## 2022-04-04 DIAGNOSIS — E119 Type 2 diabetes mellitus without complications: Secondary | ICD-10-CM | POA: Diagnosis present

## 2022-04-04 DIAGNOSIS — Z96642 Presence of left artificial hip joint: Secondary | ICD-10-CM | POA: Diagnosis present

## 2022-04-04 DIAGNOSIS — E785 Hyperlipidemia, unspecified: Secondary | ICD-10-CM | POA: Diagnosis present

## 2022-04-04 DIAGNOSIS — E875 Hyperkalemia: Secondary | ICD-10-CM | POA: Diagnosis not present

## 2022-04-04 DIAGNOSIS — N401 Enlarged prostate with lower urinary tract symptoms: Secondary | ICD-10-CM | POA: Diagnosis present

## 2022-04-04 DIAGNOSIS — R269 Unspecified abnormalities of gait and mobility: Secondary | ICD-10-CM | POA: Diagnosis present

## 2022-04-04 DIAGNOSIS — Z881 Allergy status to other antibiotic agents status: Secondary | ICD-10-CM

## 2022-04-04 DIAGNOSIS — N319 Neuromuscular dysfunction of bladder, unspecified: Secondary | ICD-10-CM

## 2022-04-04 DIAGNOSIS — R159 Full incontinence of feces: Secondary | ICD-10-CM | POA: Diagnosis present

## 2022-04-04 DIAGNOSIS — R339 Retention of urine, unspecified: Secondary | ICD-10-CM | POA: Diagnosis not present

## 2022-04-04 DIAGNOSIS — Z79890 Hormone replacement therapy (postmenopausal): Secondary | ICD-10-CM

## 2022-04-04 DIAGNOSIS — G4733 Obstructive sleep apnea (adult) (pediatric): Secondary | ICD-10-CM | POA: Diagnosis present

## 2022-04-04 DIAGNOSIS — E039 Hypothyroidism, unspecified: Secondary | ICD-10-CM | POA: Diagnosis present

## 2022-04-04 DIAGNOSIS — K592 Neurogenic bowel, not elsewhere classified: Secondary | ICD-10-CM | POA: Diagnosis present

## 2022-04-04 DIAGNOSIS — D638 Anemia in other chronic diseases classified elsewhere: Secondary | ICD-10-CM | POA: Diagnosis present

## 2022-04-04 DIAGNOSIS — Z87442 Personal history of urinary calculi: Secondary | ICD-10-CM

## 2022-04-04 DIAGNOSIS — C8339 Diffuse large B-cell lymphoma, extranodal and solid organ sites: Secondary | ICD-10-CM | POA: Diagnosis present

## 2022-04-04 DIAGNOSIS — Z6829 Body mass index (BMI) 29.0-29.9, adult: Secondary | ICD-10-CM

## 2022-04-04 DIAGNOSIS — C8510 Unspecified B-cell lymphoma, unspecified site: Secondary | ICD-10-CM

## 2022-04-04 DIAGNOSIS — Z801 Family history of malignant neoplasm of trachea, bronchus and lung: Secondary | ICD-10-CM

## 2022-04-04 DIAGNOSIS — I10 Essential (primary) hypertension: Secondary | ICD-10-CM | POA: Diagnosis present

## 2022-04-04 DIAGNOSIS — Z0189 Encounter for other specified special examinations: Secondary | ICD-10-CM | POA: Diagnosis not present

## 2022-04-04 DIAGNOSIS — D6869 Other thrombophilia: Secondary | ICD-10-CM | POA: Diagnosis present

## 2022-04-04 DIAGNOSIS — D352 Benign neoplasm of pituitary gland: Secondary | ICD-10-CM | POA: Diagnosis not present

## 2022-04-04 DIAGNOSIS — M25661 Stiffness of right knee, not elsewhere classified: Secondary | ICD-10-CM | POA: Diagnosis present

## 2022-04-04 DIAGNOSIS — R160 Hepatomegaly, not elsewhere classified: Secondary | ICD-10-CM | POA: Diagnosis present

## 2022-04-04 DIAGNOSIS — I495 Sick sinus syndrome: Secondary | ICD-10-CM | POA: Diagnosis present

## 2022-04-04 DIAGNOSIS — R338 Other retention of urine: Secondary | ICD-10-CM | POA: Diagnosis present

## 2022-04-04 DIAGNOSIS — Z825 Family history of asthma and other chronic lower respiratory diseases: Secondary | ICD-10-CM

## 2022-04-04 DIAGNOSIS — N5089 Other specified disorders of the male genital organs: Secondary | ICD-10-CM | POA: Diagnosis not present

## 2022-04-04 DIAGNOSIS — C8338 Diffuse large B-cell lymphoma, lymph nodes of multiple sites: Secondary | ICD-10-CM | POA: Diagnosis not present

## 2022-04-04 DIAGNOSIS — C8333 Diffuse large B-cell lymphoma, intra-abdominal lymph nodes: Secondary | ICD-10-CM | POA: Diagnosis not present

## 2022-04-04 LAB — CBC
HCT: 30.9 % — ABNORMAL LOW (ref 39.0–52.0)
Hemoglobin: 10.3 g/dL — ABNORMAL LOW (ref 13.0–17.0)
MCH: 29.2 pg (ref 26.0–34.0)
MCHC: 33.3 g/dL (ref 30.0–36.0)
MCV: 87.5 fL (ref 80.0–100.0)
Platelets: 115 10*3/uL — ABNORMAL LOW (ref 150–400)
RBC: 3.53 MIL/uL — ABNORMAL LOW (ref 4.22–5.81)
RDW: 16.1 % — ABNORMAL HIGH (ref 11.5–15.5)
WBC: 3.3 10*3/uL — ABNORMAL LOW (ref 4.0–10.5)
nRBC: 0 % (ref 0.0–0.2)

## 2022-04-04 LAB — BASIC METABOLIC PANEL
Anion gap: 13 (ref 5–15)
BUN: 24 mg/dL — ABNORMAL HIGH (ref 8–23)
CO2: 23 mmol/L (ref 22–32)
Calcium: 8.7 mg/dL — ABNORMAL LOW (ref 8.9–10.3)
Chloride: 100 mmol/L (ref 98–111)
Creatinine, Ser: 1.11 mg/dL (ref 0.61–1.24)
GFR, Estimated: >60 mL/min (ref >60)
Glucose, Bld: 98 mg/dL (ref 70–99)
Potassium: 4.1 mmol/L (ref 3.5–5.1)
Sodium: 136 mmol/L (ref 135–145)

## 2022-04-04 LAB — GLUCOSE, CAPILLARY: Glucose-Capillary: 99 mg/dL (ref 70–99)

## 2022-04-04 LAB — MAGNESIUM: Magnesium: 1.9 mg/dL (ref 1.7–2.4)

## 2022-04-04 MED ORDER — ASPIRIN EC 81 MG PO TBEC
81.0000 mg | DELAYED_RELEASE_TABLET | Freq: Every evening | ORAL | 11 refills | Status: AC
Start: 2022-04-08 — End: ?

## 2022-04-04 MED ORDER — CLOPIDOGREL BISULFATE 75 MG PO TABS: 75.0000 mg | ORAL_TABLET | Freq: Every day | ORAL | 1 refills | Status: AC

## 2022-04-04 MED ORDER — PROCHLORPERAZINE MALEATE 5 MG PO TABS
5.0000 mg | ORAL_TABLET | Freq: Four times a day (QID) | ORAL | Status: DC | PRN
  Filled 2022-04-04: qty 2

## 2022-04-04 MED ORDER — ENOXAPARIN SODIUM 40 MG/0.4ML IJ SOSY
40.0000 mg | PREFILLED_SYRINGE | INTRAMUSCULAR | Status: DC
  Administered 2022-04-04 – 2022-04-05 (×2): 40 mg via SUBCUTANEOUS
  Filled 2022-04-04 (×3): qty 0.4

## 2022-04-04 MED ORDER — ENSURE MAX PROTEIN PO LIQD
11.0000 [oz_av] | Freq: Two times a day (BID) | ORAL | Status: DC
  Administered 2022-04-09 – 2022-04-10 (×2): 11 [oz_av] via ORAL

## 2022-04-04 MED ORDER — INSULIN ASPART 100 UNIT/ML IJ SOLN
0.0000 [IU] | Freq: Three times a day (TID) | INTRAMUSCULAR | Status: DC
  Administered 2022-04-05 – 2022-04-07 (×2): 2 [IU] via SUBCUTANEOUS
  Administered 2022-04-08: 1 [IU] via SUBCUTANEOUS
  Administered 2022-04-12: 2 [IU] via SUBCUTANEOUS
  Administered 2022-04-12: 1 [IU] via SUBCUTANEOUS
  Administered 2022-04-13: 2 [IU] via SUBCUTANEOUS
  Administered 2022-04-13: 3 [IU] via SUBCUTANEOUS
  Administered 2022-04-13: 5 [IU] via SUBCUTANEOUS
  Administered 2022-04-14 – 2022-04-15 (×4): 2 [IU] via SUBCUTANEOUS

## 2022-04-04 MED ORDER — GUAIFENESIN-DM 100-10 MG/5ML PO SYRP: 5.0000 mL | ORAL_SOLUTION | Freq: Four times a day (QID) | ORAL | Status: DC | PRN

## 2022-04-04 MED ORDER — PROCHLORPERAZINE 25 MG RE SUPP: 12.5000 mg | Freq: Four times a day (QID) | RECTAL | Status: DC | PRN

## 2022-04-04 MED ORDER — PANTOPRAZOLE SODIUM 40 MG PO TBEC
40.0000 mg | DELAYED_RELEASE_TABLET | Freq: Every day | ORAL | Status: DC
Start: 2022-04-04 — End: 2022-04-12
  Administered 2022-04-04 – 2022-04-11 (×8): 40 mg via ORAL
  Filled 2022-04-04 (×8): qty 1

## 2022-04-04 MED ORDER — ACETAMINOPHEN 325 MG PO TABS
325.0000 mg | ORAL_TABLET | ORAL | Status: DC | PRN
  Administered 2022-04-04 – 2022-04-08 (×8): 650 mg via ORAL
  Filled 2022-04-04 (×7): qty 2

## 2022-04-04 MED ORDER — LEVOTHYROXINE SODIUM 112 MCG PO TABS
112.0000 ug | ORAL_TABLET | Freq: Every day | ORAL | Status: DC
  Administered 2022-04-05 – 2022-04-15 (×11): 112 ug via ORAL
  Filled 2022-04-04 (×11): qty 1

## 2022-04-04 MED ORDER — INSULIN ASPART 100 UNIT/ML IJ SOLN
0.0000 [IU] | Freq: Every day | INTRAMUSCULAR | Status: DC
  Administered 2022-04-12 – 2022-04-13 (×2): 3 [IU] via SUBCUTANEOUS

## 2022-04-04 MED ORDER — UMECLIDINIUM BROMIDE 62.5 MCG/ACT IN AEPB
1.0000 | INHALATION_SPRAY | Freq: Every day | RESPIRATORY_TRACT | Status: DC
  Administered 2022-04-05 – 2022-04-13 (×9): 1 via RESPIRATORY_TRACT
  Filled 2022-04-04 (×2): qty 7

## 2022-04-04 MED ORDER — IPRATROPIUM-ALBUTEROL 0.5-2.5 (3) MG/3ML IN SOLN: 3.0000 mL | RESPIRATORY_TRACT | Status: DC | PRN

## 2022-04-04 MED ORDER — POLYETHYLENE GLYCOL 3350 17 G PO PACK
17.0000 g | PACK | Freq: Every day | ORAL | Status: DC | PRN
Start: 2022-04-04 — End: 2022-04-15

## 2022-04-04 MED ORDER — EZETIMIBE 10 MG PO TABS
10.0000 mg | ORAL_TABLET | Freq: Every morning | ORAL | Status: DC
  Administered 2022-04-05 – 2022-04-15 (×11): 10 mg via ORAL
  Filled 2022-04-04 (×11): qty 1

## 2022-04-04 MED ORDER — BISACODYL 10 MG RE SUPP
10.0000 mg | Freq: Every day | RECTAL | Status: DC
  Administered 2022-04-04 – 2022-04-14 (×8): 10 mg via RECTAL
  Filled 2022-04-04 (×9): qty 1

## 2022-04-04 MED ORDER — JUVEN PO PACK
1.0000 | PACK | Freq: Two times a day (BID) | ORAL | Status: DC
  Administered 2022-04-05 – 2022-04-10 (×5): 1 via ORAL
  Filled 2022-04-04 (×4): qty 1

## 2022-04-04 MED ORDER — FINASTERIDE 5 MG PO TABS
5.0000 mg | ORAL_TABLET | Freq: Every day | ORAL | Status: DC
  Administered 2022-04-04 – 2022-04-14 (×11): 5 mg via ORAL
  Filled 2022-04-04 (×11): qty 1

## 2022-04-04 MED ORDER — NITROGLYCERIN 0.4 MG SL SUBL: 0.4000 mg | SUBLINGUAL_TABLET | SUBLINGUAL | Status: DC | PRN

## 2022-04-04 MED ORDER — ADULT MULTIVITAMIN W/MINERALS CH
1.0000 | ORAL_TABLET | Freq: Every day | ORAL | Status: DC
  Administered 2022-04-05 – 2022-04-14 (×10): 1 via ORAL
  Filled 2022-04-04 (×11): qty 1

## 2022-04-04 MED ORDER — RANOLAZINE ER 500 MG PO TB12
1000.0000 mg | ORAL_TABLET | Freq: Two times a day (BID) | ORAL | Status: DC
  Administered 2022-04-04 – 2022-04-14 (×21): 1000 mg via ORAL
  Filled 2022-04-04 (×23): qty 2

## 2022-04-04 MED ORDER — PROCHLORPERAZINE EDISYLATE 10 MG/2ML IJ SOLN
5.0000 mg | Freq: Four times a day (QID) | INTRAMUSCULAR | Status: DC | PRN
  Filled 2022-04-04: qty 2

## 2022-04-04 MED ORDER — GLUCERNA SHAKE PO LIQD: 237.0000 mL | Freq: Three times a day (TID) | ORAL | Status: DC

## 2022-04-04 MED ORDER — ALUM & MAG HYDROXIDE-SIMETH 200-200-20 MG/5ML PO SUSP
30.0000 mL | ORAL | Status: DC | PRN
  Administered 2022-04-09: 30 mL via ORAL

## 2022-04-04 MED ORDER — GLUCERNA SHAKE PO LIQD: 237.0000 mL | Freq: Two times a day (BID) | ORAL | Status: DC

## 2022-04-04 MED ORDER — BISACODYL 10 MG RE SUPP
10.0000 mg | Freq: Every day | RECTAL | Status: DC | PRN
Start: 2022-04-04 — End: 2022-04-12

## 2022-04-04 MED ORDER — ENSURE ENLIVE PO LIQD: 237.0000 mL | Freq: Two times a day (BID) | ORAL | Status: DC

## 2022-04-04 MED ORDER — TRAZODONE HCL 50 MG PO TABS: 25.0000 mg | ORAL_TABLET | Freq: Every evening | ORAL | Status: DC | PRN

## 2022-04-04 MED ORDER — OXYCODONE HCL 5 MG PO TABS
2.5000 mg | ORAL_TABLET | Freq: Four times a day (QID) | ORAL | Status: DC | PRN
  Administered 2022-04-04 – 2022-04-08 (×7): 5 mg via ORAL
  Filled 2022-04-04 (×7): qty 1

## 2022-04-04 MED ORDER — POLYETHYLENE GLYCOL 3350 17 G PO PACK: 17.0000 g | PACK | Freq: Every day | ORAL | 0 refills | Status: AC | PRN

## 2022-04-04 MED ORDER — GLUCERNA SHAKE PO LIQD: 237.0000 mL | Freq: Two times a day (BID) | ORAL | 0 refills | Status: AC

## 2022-04-04 MED ORDER — DIPHENHYDRAMINE HCL 12.5 MG/5ML PO ELIX: 12.5000 mg | ORAL_SOLUTION | Freq: Four times a day (QID) | ORAL | Status: DC | PRN

## 2022-04-04 MED ORDER — FLEET ENEMA 7-19 GM/118ML RE ENEM: 1.0000 | ENEMA | Freq: Once | RECTAL | Status: DC | PRN

## 2022-04-04 MED ORDER — TAMSULOSIN HCL 0.4 MG PO CAPS
0.4000 mg | ORAL_CAPSULE | Freq: Every day | ORAL | Status: DC
Start: 2022-04-04 — End: 2022-04-05
  Administered 2022-04-04: 0.4 mg via ORAL
  Filled 2022-04-04: qty 1

## 2022-04-04 MED ORDER — REVEFENACIN 175 MCG/3ML IN SOLN
175.0000 ug | Freq: Every day | RESPIRATORY_TRACT | Status: DC
  Filled 2022-04-04: qty 3

## 2022-04-04 MED ORDER — TIOTROPIUM BROMIDE MONOHYDRATE 2.5 MCG/ACT IN AERS: 2.0000 | INHALATION_SPRAY | Freq: Every day | RESPIRATORY_TRACT | Status: DC

## 2022-04-04 NOTE — Progress Notes (Signed)
Interventional Radiology Brief Note:  Plavix and aspirin continue to be held.  NPO p MN Monday-- order in place.  Lovenox held Sunday afternoon.   Brynda Greathouse, MS RD PA-C

## 2022-04-04 NOTE — Progress Notes (Signed)
This chaplain is present with the Pt., Pt. Wife-[name], notary, and witnesses for the notarizing of the Pt. Advance Directive: HCPOA and Living Will.   The Pt. chose Warrick Llera as his healthcare agent. If this person is unwilling or unable to serve the Pt. next choice is Tyler Aas, Brooke Bonito.  The Pt. gave the original AD to the Pt. along with two copies. The chaplain scanned the Pt. AD into his EMR.  This chaplain is available for F/U spiritual care as needed.  Chaplain Sallyanne Kuster [phone]

## 2022-04-04 NOTE — Progress Notes (Signed)
Izora Ribas, MD  Physician Physical Medicine and Rehabilitation PMR Pre-admission    Signed Date of Service:  04/04/2022 12:17 PM  Related encounter: ED to Hosp-Admission (Discharged) from 03/25/2022 in [facility]   Signed      Show:Clear all [x] Written[x] Templated[x] Copied  Added by: [x] Cristina Gong, RN[x] Raulkar, Clide Deutscher, MD  [] Hover for details                                                                                                                                                                                                                                                                                                                                                                                                                                  PMR Admission Coordinator Pre-Admission Assessment   Patient: Austin Oliver is an 66 y.o., male MRN: [id number] DOB: [birth year] Height: 5' 9"  (175.3 cm) Weight: 95.3 kg   Insurance Information HMO:     PPO:      PCP:      IPA:      80/20:      OTHER:  PRIMARY: UHC medicare      Policy#: [id number]      Subscriber: pt CM Name: Gilmore Laroche from [place] approved on expedited appeal      Phone#: [phone] Option #7     Fax#: [phone] Pre-Cert#: [id number] [place] # [id number] approved for 7 days after appeal  Employer:  Benefits:  Phone #: [phone]     Name: 5/18 Eff. Date: 11/17/21     Deduct: $150      Out of Pocket Max: $3300      Life Max: none CIR: 80%      SNF: no copay days 1 until 20; 80% for days 21 until 100 Outpatient: $40 per visit     Co-Pay: visits per medical neccesity  Home Health: 80%      Co-Pay: visits per medical neccesity DME: 80%     Co-Pay: 20% Providers: in network  SECONDARY: none         Financial Counselor:       Phone#:    The  Therapist, art Information Summary" for patients in Inpatient Rehabilitation Facilities with attached "Privacy Act [place] Records" was provided and verbally reviewed with: Patient and Family   Emergency Contact Information Contact Information       Name Relation Home Work Mobile    [place] Spouse [phone]   [place]. Son [phone]        [place], [place]     [phone]         Current Medical History  Patient Admitting Diagnosis: Spinal cord infarct   History of Present Illness: 66 year old male with history of recurrent strokes, CAD, COPD, sick sinus syndrome s/p pacemaker placement, Cirrhosis with recent admit for stroke. 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 Discharged on 03/18/22. At the time of discharge he was ambulating with RW. Since then he has become progressively weaker with falls. Chronic RLE paresthesias since right knee surgery. Urinary issues chronic since January.   Neurology consulted with initial MRI brain correlates with less likely CVA and more likely demyelinating disease with possible pituitary adenoma. MRI thoracic/lumbar spine significant for abnormal cord signal at T3 and T 4 levels suggestive of cord ischemia vs transverse myelitis. Neurology concern mostly for spinal cord infarct leading to symptoms secondary to hypercoagulable state from presumed metastatic cancer. SA and Plavix currently on hold.    CT chest/abd/pelvis with masses of the liver, masses of the adrenal glands, abdominal lymphadenopathy.There is also concern for cirrhosis of the liver based on [place] in 2022.Abnormal T10 signal intensity posterior aspect L3 and L4 and most of the L5 and S1 vertebral bodies with Contrast enhancement, also in the T 4 vertebral body without destructive bony lesions.  Not amenable to biopsy.  IR consulted for biopsy on 5/22 with is delayed secondary to ASA and Plavix. 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 No clinical features currently concerning for hyperfunctioning lesion at this time. Chromic retention of urine due to BPH, but due to concern for neurogenic bladder Foley to remain currently.     Complete NIHSS TOTAL: 5   Patient's medical record from [facility] has been reviewed by the rehabilitation admission coordinator and physician.   Past Medical History      Past Medical History:  Diagnosis Date   Cirrhosis of liver ([place])     COPD (chronic obstructive pulmonary disease) ([place])     Coronary artery calcification seen on CAT scan 01/14/2017   Essential hypertension 09/11/2020   GERD (gastroesophageal reflux disease)     History of kidney stones     Hyperlipidemia     Hypothyroidism     OSA on CPAP      Mild with  AHI 7.8/hr >>on CPAP   Osteoarthritis     Presence of permanent cardiac pacemaker     Shingles 2012    Has the patient had major surgery during 100 days prior to admission? No   Family History   family history includes COPD in his father; Dementia in his mother; Heart Problems in his father; Lung cancer in his father.   Current Medications   Current Facility-Administered Medications:    acetaminophen (TYLENOL) tablet 650 mg, 650 mg, Oral, Q4H PRN, 650 mg at 04/03/22 2016 **OR** acetaminophen (TYLENOL) 160 MG/5ML solution 650 mg, 650 mg, Per Tube, Q4H PRN **OR** acetaminophen (TYLENOL) suppository 650 mg, 650 mg, Rectal, Q4H PRN, Elodia Florence., MD   Chlorhexidine Gluconate Cloth 2 % PADS 6 each, 6 each, Topical, Daily, Lavina Hamman, MD, 6 each at 04/04/22 1123   enoxaparin (LOVENOX) injection 40 mg, 40 mg, Subcutaneous, Q24H, Elodia Florence., MD, 40 mg at 04/02/22 1652   ezetimibe (ZETIA) tablet 10 mg, 10 mg, Oral, q AM, Mariel Aloe, MD, 10 mg at 04/04/22 0602   feeding supplement (GLUCERNA SHAKE) (GLUCERNA SHAKE) liquid 237 mL, 237 mL, Oral, BID  BM, Mariel Aloe, MD, 237 mL at 04/03/22 0935   finasteride (PROSCAR) tablet 5 mg, 5 mg, Oral, QHS, Mariel Aloe, MD, 5 mg at 04/03/22 2156   gadobutrol (GADAVIST) 1 MMOL/ML injection 10 mL, 10 mL, Intravenous, Once PRN, Donnetta Simpers, MD   ipratropium-albuterol (DUONEB) 0.5-2.5 (3) MG/3ML nebulizer solution 3 mL, 3 mL, Nebulization, Q4H PRN, Mariel Aloe, MD   levothyroxine (SYNTHROID) tablet 112 mcg, 112 mcg, Oral, QAC breakfast, Mariel Aloe, MD, 112 mcg at 04/04/22 0602   multivitamin with minerals tablet 1 tablet, 1 tablet, Oral, Daily, Mariel Aloe, MD, 1 tablet at 04/04/22 1122   oxyCODONE (Oxy IR/ROXICODONE) immediate release tablet 2.5-5 mg, 2.5-5 mg, Oral, Q6H PRN, Mariel Aloe, MD, 5 mg at 04/04/22 0439   pantoprazole (PROTONIX) EC tablet 40 mg, 40 mg, Oral, QPC supper, Mariel Aloe, MD, 40 mg at 04/03/22 1723   polyethylene glycol (MIRALAX / GLYCOLAX) packet 17 g, 17 g, Oral, Daily PRN, Lavina Hamman, MD   ranolazine (RANEXA) 12 hr tablet 1,000 mg, 1,000 mg, Oral, BID, Mariel Aloe, MD, 1,000 mg at 04/04/22 1025   revefenacin (YUPELRI) nebulizer solution 175 mcg, 175 mcg, Nebulization, Daily, Paytes, Austin A, RPH, 175 mcg at 04/04/22 0859   senna-docusate (Senokot-S) tablet 1 tablet, 1 tablet, Oral, QHS PRN, Elodia Florence., MD   tamsulosin [facility]) capsule 0.4 mg, 0.4 mg, Oral, Q supper, Mariel Aloe, MD, 0.4 mg at 04/03/22 1723   Patients Current Diet:  Diet Order                  Diet 2 gram sodium Room service appropriate? Yes; Fluid consistency: Thin  Diet effective now                       Precautions / Restrictions Precautions Precautions: Fall Restrictions Weight Bearing Restrictions: No    Has the patient had 2 or more falls or a fall with injury in the past year? Yes   Prior Activity Level Limited Community (1-2x/wk): has needed assist over last few weeks with frequent falls   Prior Functional Level Self Care: Did  the patient need help bathing, dressing, using the toilet or eating? Needed some help   Indoor Mobility: Did the patient  need assistance with walking from room to room (with or without device)? Needed some help   Stairs: Did the patient need assistance with internal or external stairs (with or without device)? Needed some help   Functional Cognition: Did the patient need help planning regular tasks such as shopping or remembering to take medications? Independent   Patient Information Are you of Hispanic, Latino/a,or Spanish origin?: A. No, not of Hispanic, Latino/a, or Spanish origin What is your race?: A. White Do you need or want an interpreter to communicate with a doctor or health care staff?: 0. No   Patient's Response To:  Health Literacy and Transportation Is the patient able to respond to health literacy and transportation needs?: Yes Health Literacy - How often do you need to have someone help you when you read instructions, pamphlets, or other written material from your doctor or pharmacy?: Never In the past 12 months, has lack of transportation kept you from medical appointments or from getting medications?: No In the past 12 months, has lack of transportation kept you from meetings, work, or from getting things needed for daily living?: No   Development worker, international aid / [place] Devices/Equipment: Wheelchair Home Equipment: Conservation officer, nature (2 wheels), BSC/3in1, [place] - single point, [employer], Grab bars - toilet, Grab bars - tub/shower, Wheelchair - manual   Prior Device Use: Indicate devices/aids used by the patient prior to current illness, exacerbation or injury? Manual wheelchair and Walker   Current Functional Level Cognition   Arousal/Alertness: Awake/alert Overall Cognitive Status: Within Functional Limits for tasks assessed Orientation Level: Oriented X4 General Comments: HOH Attention: Focused, Sustained Focused Attention: Appears intact Sustained  Attention: Impaired Sustained Attention Impairment: Verbal complex Memory: Impaired Memory Impairment: Retrieval deficit, Decreased short term memory (Immediate: 5/5 with repetition; delayed: 5/5; with additional processing time) Awareness: Appears intact Problem Solving: Impaired Problem Solving Impairment: Verbal complex (Money: 3/3; time: 1/1 with cues) Executive Function: Sequencing, Technical brewer: Impaired Sequencing Impairment: Verbal complex (clock: 2/4) Organizing: Appears intact    Extremity Assessment (includes Sensation/Coordination)   Upper Extremity Assessment: Overall WFL for tasks assessed  Lower Extremity Assessment: Defer to PT evaluation RLE Deficits / Details: No AROM at ankle. Minimal muscle activation at hip and knee with MMT     ADLs   Overall ADL's : Needs assistance/impaired Eating/Feeding: Independent, Bed level Grooming: Brushing hair, Sitting Upper Body Bathing: Sitting, Minimal assistance Upper Body Bathing Details (indicate cue type and reason): min A to wash back Lower Body Bathing: Minimal assistance, Sitting/lateral leans, Sit to/from stand Lower Body Bathing Details (indicate cue type and reason): to wash feet Upper Body Dressing : Set up, Sitting Upper Body Dressing Details (indicate cue type and reason): to don gown Lower Body Dressing: Maximal assistance, Sit to/from stand, Sitting/lateral leans Lower Body Dressing Details (indicate cue type and reason): to don socks Toilet Transfer: Moderate assistance, +2 for physical assistance Toilet Transfer Details (indicate cue type and reason): simulated to chair with use of [place] Toileting- Clothing Manipulation and Hygiene: +2 for physical assistance, Total assistance, Sit to/from stand Functional mobility during ADLs:  (unable to ambulate)     Mobility   Overal bed mobility: Needs Assistance Bed Mobility: Rolling, Sidelying to Sit Rolling: Min assist Sidelying to sit: Min assist Supine to  sit: HOB elevated, Mod assist Sit to supine: Min assist Sit to sidelying: Min assist, Mod assist, +2 for safety/equipment General bed mobility comments: up via R elbow and scooted to EOB all with min.  Transfers   Overall transfer level: Needs assistance Equipment used:  (EVA walker) Transfers: Sit to/from Stand, Bed to chair/wheelchair/BSC Sit to Stand: +2 safety/equipment, Mod assist Bed to/from chair/wheelchair/BSC transfer type:: Lateral/scoot transfer  Lateral/Scoot Transfers: Min assist Transfer via Lift Equipment:  (EVA walker) General transfer comment: sit to stand x2 for up to 3+ min each trial in standing during peri care and working on upright stand, w/shifts.  scoot transfer to the L with minimal boost to bridge the gap between the chair and bed. (xith 2-3 min  in standing.)     Ambulation / Gait / Stairs / Proofreader / Balance Dynamic Sitting Balance Sitting balance - Comments: can not accept challenge, able to sit without UE assist. Balance Overall balance assessment: History of Falls, Needs assistance Sitting-balance support: Single extremity supported, No upper extremity supported, Feet supported Sitting balance-Leahy Scale: Fair Sitting balance - Comments: can not accept challenge, able to sit without UE assist. Standing balance support: Bilateral upper extremity supported, During functional activity Standing balance-Leahy Scale: Zero Standing balance comment: up in the EVA walker, working on upright stand, knee control.     Special needs/care consideration Liver biopsy scheduled with IR for 04/07/22    Previous Home Environment  Living Arrangements: Spouse/significant other  Lives With: Spouse Available Help at Discharge: Family, Available 24 hours/day Type of Home: House Home Layout: Multi-level, Able to live on main level with bedroom/bathroom Home Access: Ramped entrance Entrance Stairs-Rails: None Entrance Stairs-Number of Steps:  1 Bathroom Shower/Tub: Multimedia programmer: Standard Bathroom Accessibility: Yes How Accessible: Accessible via walker [place]: No Additional Comments: pt in [place]   Discharge Living Setting Plans for Discharge Living Setting: Patient's home, Lives with (comment) (spouse) Type of Home at Discharge: House Discharge Home Layout: Multi-level, Able to live on main level with bedroom/bathroom Discharge Home Access: Ramped entrance Discharge Bathroom Shower/Tub: Walk-in shower Discharge Bathroom Toilet: Standard Discharge Bathroom Accessibility: Yes How Accessible: Accessible via walker Does the patient have any problems obtaining your medications?: No   Social/Family/Support Systems Patient Roles: Spouse Contact Information: wife Anticipated Caregiver: wife Anticipated Caregiver's Contact Information: see contacts Ability/Limitations of Caregiver: can provide min assist Caregiver Availability: 24/7 Discharge Plan Discussed with Primary Caregiver: Yes Is Caregiver In Agreement with Plan?: Yes Does Caregiver/Family have Issues with Lodging/Transportation while Pt is in Rehab?: No   Goals Patient/Family Goal for Rehab: min assist with PT and OT at wheelchair level Expected length of stay: ELOS 14 to 20 days Additional Information: Liver BX in IR Planned for 5/22 Pt/Family Agrees to Admission and willing to participate: Yes Program Orientation Provided & Reviewed with Pt/Caregiver Including Roles  & Responsibilities: Yes   Decrease burden of Care through IP rehab admission: n/a   Possible need for SNF placement upon discharge: not anticipated   Patient Condition: I have reviewed medical records from [facility], spoken with CM, and patient and spouse. I met with patient at the bedside for inpatient rehabilitation assessment.  Patient will benefit from ongoing PT, OT, and SLP, can actively participate in 3 hours of therapy a day 5 days of the week, and can  make measurable gains during the admission.  Patient will also benefit from the coordinated team approach during an Inpatient Acute Rehabilitation admission.  The patient will receive intensive therapy as well as Rehabilitation physician, nursing, social worker, and care management interventions.  Due to bladder management, bowel management, safety, skin/wound care,  disease management, medication administration, pain management, and patient education the patient requires 24 hour a day rehabilitation nursing.  The patient is currently mod to max assist with mobility and basic ADLs.  Discharge setting and therapy post discharge at home with home health is anticipated.  Patient has agreed to participate in the Acute Inpatient Rehabilitation Program and will admit today.   Preadmission Screen Completed By:  Cleatrice Burke, 04/04/2022 12:18 PM ______________________________________________________________________   Discussed status with Dr. Ranell Patrick on 04/04/22 at 1232 and received approval for admission today.   Admission Coordinator:  Cleatrice Burke, RN, time  1232 Date 04/04/22    Assessment/Plan: Diagnosis: Spinal cord infarction Does the need for close, 24 hr/day Medical supervision in concert with the patient's rehab needs make it unreasonable for this patient to be served in a less intensive setting? Yes Co-Morbidities requiring supervision/potential complications: Bilateral lower extremity weakness, pancytopenia, CAD, essential HTN, history of CVA Due to bladder management, bowel management, safety, skin/wound care, disease management, medication administration, pain management, and patient education, does the patient require 24 hr/day rehab nursing? Yes Does the patient require coordinated care of a physician, rehab nurse, PT, OT to address physical and functional deficits in the context of the above medical diagnosis(es)? Yes Addressing deficits in the following areas: balance,  endurance, locomotion, strength, transferring, bowel/bladder control, bathing, dressing, feeding, grooming, toileting, and psychosocial support Can the patient actively participate in an intensive therapy program of at least 3 hrs of therapy 5 days a week? Yes The potential for patient to make measurable gains while on inpatient rehab is excellent Anticipated functional outcomes upon discharge from inpatient rehab: min assist PT, min assist OT, independent SLP Estimated rehab length of stay to reach the above functional goals is: 20-22 days Anticipated discharge destination: Home 10. Overall Rehab/Functional Prognosis: excellent     MD Signature: Leeroy Cha, MD         Revision History                     Note Details  Author Izora Ribas, MD File Time 04/04/2022 12:37 PM  Author Type Physician Status Signed  Last Editor Izora Ribas, MD Service Physical Medicine and [place] # [id number] Admit Date 04/04/2022

## 2022-04-04 NOTE — Progress Notes (Signed)
   Pt is scheduled for liver biopsy Monday 5/22 in IR  See new orders  We will call for him 5/22 when can

## 2022-04-04 NOTE — Evaluation (Signed)
Occupational Therapy Assessment and Plan  Patient Details  Name: Austin Oliver MRN: [id number] Date of Birth: [birth year]  OT Diagnosis: {diagnoses:3041644} Rehab Potential:   ELOS:     {CHL IP REHAB OT TIME CALCULATIONS:304400400}    Hospital Problem: Principal Problem:   Spinal cord infarction [facility])   Past Medical History:  Past Medical History:  Diagnosis Date   Cirrhosis of liver ([place])    COPD (chronic obstructive pulmonary disease) ([place])    Coronary artery calcification seen on CAT scan 01/14/2017   Essential hypertension 09/11/2020   GERD (gastroesophageal reflux disease)    History of kidney stones    Hyperlipidemia    Hypothyroidism    OSA on CPAP    Mild with AHI 7.8/hr >>on CPAP   Osteoarthritis    Presence of permanent cardiac pacemaker    Shingles 2012   Past Surgical History:  Past Surgical History:  Procedure Laterality Date   CORONARY STENT INTERVENTION N/A 08/24/2020   Procedure: CORONARY STENT INTERVENTION;  Surgeon: [name], MD;  Location: [place] [facility];  Service: Cardiovascular;  Laterality: N/A;   HEEL SPUR SURGERY Left 80's   INTRAVASCULAR PRESSURE WIRE/FFR STUDY N/A 04/25/2021   Procedure: INTRAVASCULAR PRESSURE WIRE/FFR STUDY;  Surgeon: [name], MD;  Location: [place] [facility];  Service: Cardiovascular;  Laterality: N/A;   INTRAVASCULAR ULTRASOUND/IVUS N/A 08/24/2020   Procedure: Intravascular Ultrasound/IVUS;  Surgeon: [name], MD;  Location: [place] [facility];  Service: Cardiovascular;  Laterality: N/A;   KNEE ARTHROPLASTY Right 10/31/2021   Procedure: COMPUTER ASSISTED TOTAL KNEE ARTHROPLASTY;  Surgeon: Rod Can, MD;  Location: [facility];  Service: Orthopedics;  Laterality: Right;   KNEE SURGERY Bilateral    numerous times   LEFT HEART CATH AND CORONARY ANGIOGRAPHY N/A 08/24/2020   Procedure: LEFT HEART CATH AND CORONARY ANGIOGRAPHY;  Surgeon: [name], MD;  Location: [place] [facility];  Service:  Cardiovascular;  Laterality: N/A;   LEFT HEART CATH AND CORONARY ANGIOGRAPHY N/A 04/25/2021   Procedure: LEFT HEART CATH AND CORONARY ANGIOGRAPHY;  Surgeon: [name], MD;  Location: [place] [facility];  Service: Cardiovascular;  Laterality: N/A;   NECK SURGERY  70's   PACEMAKER IMPLANT N/A 12/04/2020   Procedure: PACEMAKER IMPLANT;  Surgeon: [name], MD;  Location: [place] [facility];  Service: Cardiovascular;  Laterality: N/A;   TOTAL HIP ARTHROPLASTY Left 03/09/2017   Procedure: LEFT TOTAL HIP ARTHROPLASTY ANTERIOR APPROACH;  Surgeon: Rod Can, MD;  Location: [place];  Service: Orthopedics;  Laterality: Left;  Dr. requesting RNFA    Assessment & Plan Clinical Impression: Patient is a 66 y.o. year old male with recent admission to the hospital on 03/25/22 with recurrent falls and worsening B LE weakenss, R>L. CT suggestive of demylenating process vs microvascular ischemic disease. 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 Patient transferred to CIR on 04/04/2022 .    Patient currently requires {YSA:6301601} with {UXN:2355732} secondary to {impairments:3041632}.  Prior to hospitalization, patient could complete *** with {KGU:5427062}.  Patient will benefit from skilled intervention to {benefit of skilled intervention:3041641} prior to discharge {discharge:3041642}.  Anticipate patient will require {supervision/assistance:22779} and {follow BJ:6283151}.      OT Evaluation Precautions/Restrictions    General   Vital Signs Therapy Vitals Temp: 98.7 F (37.1 C) Pulse Rate: 76  Resp: 18 BP: 102/65 Patient Position (if appropriate): Lying Oxygen Therapy SpO2: 95 % O2 Device: Room Air Pain Pain Assessment Pain Scale: 0-10 Pain Score: 4  Pain Type: Chronic pain Pain  Intervention(s): Medication (See eMAR) Home Living/Prior Functioning Home Living Family/patient expects to be discharged to:: Private residence Living Arrangements: Spouse/significant other Vision   Perception    Praxis   Cognition   Sensation   Motor     Trunk/Postural Assessment     Balance   Extremity/Trunk Assessment      Care Tool Care Tool Self Care Eating   Eating Assist Level: Set up assist    Oral Care         Bathing              Upper Body Dressing(including orthotics)            Lower Body Dressing (excluding footwear)          Putting on/Taking off footwear             Care Tool Toileting Toileting activity   Assist for toileting: Total Assistance - Patient < 25%     Care Tool Bed Mobility Roll left and right activity   Roll left and right assist level: Minimal Assistance - Patient > 75%    Sit to lying activity        Lying to sitting on side of bed activity         Care Tool Transfers Sit to stand transfer        Chair/bed transfer         Toilet transfer   Assist Level: Total Assistance - Patient < 25%     Care Tool Cognition  Expression of Ideas and Wants    Understanding Verbal and Non-Verbal Content     Memory/Recall Ability     Refer to Care Plan for Long Term Goals  SHORT TERM GOAL WEEK 1    Recommendations for other services: {RECOMMENDATIONS FOR OTHER SERVICES:3049016}   Skilled Therapeutic Intervention ADL   Mobility      Discharge Criteria: Patient will be discharged from OT if patient refuses treatment 3 consecutive times without medical reason, if treatment goals not met, if there is a change in medical status, if patient makes no progress towards goals or if patient is discharged from hospital.  The above assessment, treatment plan, treatment alternatives and goals were discussed and mutually agreed upon: {Assessment/Treatment Plan Discussed/Agreed:3049017}  Daneen Schick Jasten Guyette 04/04/2022,  10:22 PM

## 2022-04-04 NOTE — Progress Notes (Addendum)
Inpatient Rehabilitation Admissions Coordinator   We have won appeal with [employer] for approval to admit to Cir after liver BX today. I met with patient at bedside and he is aware. Acute team and TOC made aware.  Danne Baxter, RN, MSN Rehab Admissions Coordinator [phone] 04/04/2022 8:04 AM  Noted biopsy rescheduled for Monday. Dr Naaman Plummer and Dr Marlowe Sax have agreed for Cir admit today with planned Biopsy from CIR on Monday.  Danne Baxter, RN, MSN Rehab Admissions Coordinator [phone] 04/04/2022 10:58 AM

## 2022-04-04 NOTE — PMR Pre-admission (Signed)
PMR Admission Coordinator Pre-Admission Assessment  Patient: Austin Oliver is an 66 y.o., male MRN: [id number] DOB: May 03, 1956 Height: 5' 9"  (175.3 cm) Weight: 95.3 kg  Insurance Information HMO:     PPO:      PCP:      IPA:      80/20:      OTHER:  PRIMARY: U[employer] medicare      Policy#: [id number]      Subscriber: pt CM Name: Austin Oliver from [employer] approved on expedited appeal      Phone#: [phone] Option #7     Fax#: [phone] Pre-Cert#: [id number] Austin Oliver # [id number] approved for 7 days after appeal      Employer:  Benefits:  Phone #: [phone]     Name: 5/18 Eff. Date: 11/17/21     Deduct: $150      Out of Pocket Max: $3300      Life Max: none CIR: 80%      SNF: no copay days 1 until 20; 80% for days 21 until 100 Outpatient: $40 per visit     Co-Pay: visits per medical neccesity  Home Health: 80%      Co-Pay: visits per medical neccesity DME: 80%     Co-Pay: 20% Providers: in network  SECONDARY: none        Financial Counselor:       Phone#:   The Therapist, art Information Summary" for patients in Inpatient Rehabilitation Facilities with attached "Privacy Act Sesser Records" was provided and verbally reviewed with: Patient and Family  Emergency Contact Information Contact Information     Name Relation Home Work Mobile   Austin Oliver Spouse [phone]  [place]. Son [phone]     Austin Oliver, Austin Oliver   [phone]      Current Medical History  Patient Admitting Diagnosis: Spinal cord infarct  History of Present Illness: 66 year old male with history of recurrent strokes, CAD, COPD, sick sinus syndrome s/p pacemaker placement, Cirrhosis with recent admit for stroke. 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 Discharged on 03/18/22. At the time of discharge he was ambulating with RW. Since then he has become progressively weaker with falls. Chronic RLE paresthesias since right knee surgery. Urinary issues  chronic since January.  Neurology consulted with initial MRI brain correlates with less likely CVA and more likely demyelinating disease with possible pituitary adenoma. MRI thoracic/lumbar spine significant for abnormal cord signal at T3 and T 4 levels suggestive of cord ischemia vs transverse myelitis. Neurology concern mostly for spinal cord infarct leading to symptoms secondary to hypercoagulable state from presumed metastatic cancer. SA and Plavix currently on hold.   CT chest/abd/pelvis with masses of the liver, masses of the adrenal glands, abdominal lymphadenopathy.There is also concern for cirrhosis of the liver based on [place] in 2022.Abnormal T10 signal intensity posterior aspect L3 and L4 and most of the L5 and S1 vertebral bodies with Contrast enhancement, also in the T 4 vertebral body without destructive bony lesions.  Not amenable to biopsy.  IR consulted for biopsy on 5/22 with is delayed secondary to ASA and Plavix. 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 No clinical features currently concerning for hyperfunctioning lesion at this time. Chromic retention of urine due to BPH, but due to concern for neurogenic bladder Foley to remain currently.    Complete NIHSS TOTAL: 5  Patient's [facility] record from [facility]  has been reviewed by the rehabilitation admission coordinator and physician.  Past Medical History  Past Medical History:  Diagnosis Date   Cirrhosis of liver ([place])    COPD (chronic obstructive pulmonary disease) ([place])    Coronary artery calcification seen on CAT scan 01/14/2017   Essential hypertension 09/11/2020   GERD (gastroesophageal reflux disease)    History of kidney stones    Hyperlipidemia    Hypothyroidism    OSA on CPAP    Mild with AHI 7.8/hr >>on CPAP   Osteoarthritis    Presence of permanent cardiac pacemaker    Shingles 2012   Has the patient had major surgery  during 100 days prior to admission? No  Family History   family history includes COPD in his father; Dementia in his mother; Heart Problems in his father; Lung cancer in his father.  Current Medications  Current Facility-Administered Medications:    acetaminophen (TYLENOL) tablet 650 mg, 650 mg, Oral, Q4H PRN, 650 mg at 04/03/22 2016 **OR** acetaminophen (TYLENOL) 160 MG/5ML solution 650 mg, 650 mg, Per Tube, Q4H PRN **OR** acetaminophen (TYLENOL) suppository 650 mg, 650 mg, Rectal, Q4H PRN, Austin Oliver., MD   Chlorhexidine Gluconate Cloth 2 % PADS 6 each, 6 each, Topical, Daily, Austin Hamman, MD, 6 each at 04/04/22 1123   enoxaparin (LOVENOX) injection 40 mg, 40 mg, Subcutaneous, Q24H, Austin Oliver., MD, 40 mg at 04/02/22 1652   ezetimibe (ZETIA) tablet 10 mg, 10 mg, Oral, q AM, Austin Aloe, MD, 10 mg at 04/04/22 0602   feeding supplement (GLUCERNA SHAKE) (GLUCERNA SHAKE) liquid 237 mL, 237 mL, Oral, BID BM, Austin Aloe, MD, 237 mL at 04/03/22 0935   finasteride (PROSCAR) tablet 5 mg, 5 mg, Oral, QHS, Austin Aloe, MD, 5 mg at 04/03/22 2156   gadobutrol (GADAVIST) 1 MMOL/ML injection 10 mL, 10 mL, Intravenous, Once PRN, Austin Simpers, MD   ipratropium-albuterol (DUONEB) 0.5-2.5 (3) MG/3ML nebulizer solution 3 mL, 3 mL, Nebulization, Q4H PRN, Austin Aloe, MD   levothyroxine (SYNTHROID) tablet 112 mcg, 112 mcg, Oral, QAC breakfast, Austin Aloe, MD, 112 mcg at 04/04/22 0602   multivitamin with minerals tablet 1 tablet, 1 tablet, Oral, Daily, Austin Aloe, MD, 1 tablet at 04/04/22 1122   oxyCODONE (Oxy IR/ROXICODONE) immediate release tablet 2.5-5 mg, 2.5-5 mg, Oral, Q6H PRN, Austin Aloe, MD, 5 mg at 04/04/22 0439   pantoprazole (PROTONIX) EC tablet 40 mg, 40 mg, Oral, QPC supper, Austin Aloe, MD, 40 mg at 04/03/22 1723   polyethylene glycol (MIRALAX / GLYCOLAX) packet 17 g, 17 g, Oral, Daily PRN, Austin Hamman, MD   ranolazine (RANEXA) 12  hr tablet 1,000 mg, 1,000 mg, Oral, BID, Austin Aloe, MD, 1,000 mg at 04/04/22 1025   revefenacin (YUPELRI) nebulizer solution 175 mcg, 175 mcg, Nebulization, Daily, Paytes, Austin Oliver, RPH, 175 mcg at 04/04/22 0859   senna-docusate (Senokot-S) tablet 1 tablet, 1 tablet, Oral, QHS PRN, Austin Oliver., MD   tamsulosin [facility]) capsule 0.4 mg, 0.4 mg, Oral, Q supper, Austin Aloe, MD, 0.4 mg at 04/03/22 1723  Patients Current Diet:  Diet Order             Diet 2 gram sodium Room service appropriate? Yes; Fluid consistency: Thin  Diet effective now                  Precautions / Restrictions Precautions Precautions: Fall Restrictions Weight Bearing Restrictions: No  Has the patient had 2 or more falls or Oliver fall with injury in the past year? Yes  Prior Activity Level Limited Community (1-2x/wk): has needed assist over last few weeks with frequent falls  Prior Functional Level Self Care: Did the patient need help bathing, dressing, using the toilet or eating? Needed some help  Indoor Mobility: Did the patient need assistance with walking from room to room (with or without device)? Needed some help  Stairs: Did the patient need assistance with internal or external stairs (with or without device)? Needed some help  Functional Cognition: Did the patient need help planning regular tasks such as shopping or remembering to take medications? Independent  Patient Information Are you of Hispanic, Latino/Oliver,or Spanish origin?: Oliver. No, not of Hispanic, Latino/Oliver, or Spanish origin What is your race?: Oliver. White Do you need or want an interpreter to communicate with Oliver doctor or health care staff?: 0. No  Patient's Response To:  Health Literacy and Transportation Is the patient able to respond to health literacy and transportation needs?: Yes Health Literacy - How often do you need to have someone help you when you read instructions, pamphlets, or other written material from your  doctor or pharmacy?: Never In the past 12 months, has lack of transportation kept you from medical appointments or from getting medications?: No In the past 12 months, has lack of transportation kept you from meetings, work, or from getting things needed for daily living?: No  Development worker, international aid / [place] Devices/Equipment: Wheelchair Home Equipment: Conservation officer, nature (2 wheels), BSC/3in1, [place] - single point, [employer], Grab bars - toilet, Grab bars - tub/shower, Wheelchair - manual  Prior Device Use: Indicate devices/aids used by the patient prior to current illness, exacerbation or injury? Manual wheelchair and Walker  Current Functional Level Cognition  Arousal/Alertness: Awake/alert Overall Cognitive Status: Within Functional Limits for tasks assessed Orientation Level: Oriented X4 General Comments: HOH Attention: Focused, Sustained Focused Attention: Appears intact Sustained Attention: Impaired Sustained Attention Impairment: Verbal complex Memory: Impaired Memory Impairment: Retrieval deficit, Decreased short term memory (Immediate: 5/5 with repetition; delayed: 5/5; with additional processing time) Awareness: Appears intact Problem Solving: Impaired Problem Solving Impairment: Verbal complex (Money: 3/3; time: 1/1 with cues) Executive Function: Sequencing, Technical brewer: Impaired Sequencing Impairment: Verbal complex (clock: 2/4) Organizing: Appears intact    Extremity Assessment (includes Sensation/Coordination)  Upper Extremity Assessment: Overall WFL for tasks assessed  Lower Extremity Assessment: Defer to PT evaluation RLE Deficits / Details: No AROM at ankle. Minimal muscle activation at hip and knee with MMT    ADLs  Overall ADL's : Needs assistance/impaired Eating/Feeding: Independent, Bed level Grooming: Brushing hair, Sitting Upper Body Bathing: Sitting, Minimal assistance Upper Body Bathing Details (indicate cue type and reason):  min Oliver to wash back Lower Body Bathing: Minimal assistance, Sitting/lateral leans, Sit to/from stand Lower Body Bathing Details (indicate cue type and reason): to wash feet Upper Body Dressing : Set up, Sitting Upper Body Dressing Details (indicate cue type and reason): to don gown Lower Body Dressing: Maximal assistance, Sit to/from stand, Sitting/lateral leans Lower Body Dressing Details (indicate cue type and reason): to don socks Toilet Transfer: Moderate assistance, +2 for physical assistance Toilet Transfer Details (indicate cue type and reason): simulated to chair with use of Stedy Toileting- Clothing Manipulation and Hygiene: +2 for physical assistance, Total assistance, Sit to/from stand Functional mobility during ADLs:  (unable to ambulate)    Mobility  Overal bed mobility: Needs Assistance Bed Mobility: Rolling, Sidelying  to Sit Rolling: Min assist Sidelying to sit: Min assist Supine to sit: HOB elevated, Mod assist Sit to supine: Min assist Sit to sidelying: Min assist, Mod assist, +2 for safety/equipment General bed mobility comments: up via R elbow and scooted to EOB all with min.    Transfers  Overall transfer level: Needs assistance Equipment used:  (EVA walker) Transfers: Sit to/from Stand, Bed to chair/wheelchair/BSC Sit to Stand: +2 safety/equipment, Mod assist Bed to/from chair/wheelchair/BSC transfer type:: Lateral/scoot transfer  Lateral/Scoot Transfers: Min assist Transfer via Lift Equipment:  (EVA walker) General transfer comment: sit to stand x2 for up to 3+ min each trial in standing during peri care and working on upright stand, w/shifts.  scoot transfer to the L with minimal boost to bridge the gap between the chair and bed. (xith 2-3 min  in standing.)    Ambulation / Gait / Stairs / Office manager / Balance Dynamic Sitting Balance Sitting balance - Comments: can not accept challenge, able to sit without UE assist. Balance Overall  balance assessment: History of Falls, Needs assistance Sitting-balance support: Single extremity supported, No upper extremity supported, Feet supported Sitting balance-Leahy Scale: Fair Sitting balance - Comments: can not accept challenge, able to sit without UE assist. Standing balance support: Bilateral upper extremity supported, During functional activity Standing balance-Leahy Scale: Zero Standing balance comment: up in the EVA walker, working on upright stand, knee control.    Special needs/care consideration Liver biopsy scheduled with IR for 04/07/22   Previous Home Environment  Living Arrangements: Spouse/significant other  Lives With: Spouse Available Help at Discharge: Family, Available 24 hours/day Type of Home: House Home Layout: Multi-level, Able to live on main level with bedroom/bathroom Home Access: Ramped entrance Entrance Stairs-Rails: None Entrance Stairs-Number of Steps: 1 Bathroom Shower/Tub: Multimedia programmer: Standard Bathroom Accessibility: Yes How Accessible: Accessible via walker [place]: No Additional Comments: pt in [place]  Discharge Living Setting Plans for Discharge Living Setting: Patient's home, Lives with (comment) (spouse) Type of Home at Discharge: House Discharge Home Layout: Multi-level, Able to live on main level with bedroom/bathroom Discharge Home Access: Ramped entrance Discharge Bathroom Shower/Tub: Walk-in shower Discharge Bathroom Toilet: Standard Discharge Bathroom Accessibility: Yes How Accessible: Accessible via walker Does the patient have any problems obtaining your medications?: No  Social/Family/Support Systems Patient Roles: Spouse Contact Information: wife Anticipated Caregiver: wife Anticipated Caregiver's Contact Information: see contacts Ability/Limitations of Caregiver: can provide min assist Caregiver Availability: 24/7 Discharge Plan Discussed with Primary Caregiver: Yes Is Caregiver In  Agreement with Plan?: Yes Does Caregiver/Family have Issues with Lodging/Transportation while Pt is in Rehab?: No  Goals Patient/Family Goal for Rehab: min assist with PT and OT at wheelchair level Expected length of stay: ELOS 14 to 20 days Additional Information: Liver BX in IR Planned for 5/22 Pt/Family Agrees to Admission and willing to participate: Yes Program Orientation Provided & Reviewed with Pt/Caregiver Including Roles  & Responsibilities: Yes  Decrease burden of Care through IP rehab admission: n/Oliver  Possible need for SNF placement upon discharge: not anticipated  Patient Condition: I have reviewed medical records from [facility], spoken with CM, and patient and spouse. I met with patient at the bedside for inpatient rehabilitation assessment.  Patient will benefit from ongoing PT, OT, and SLP, can actively participate in 3 hours of therapy Oliver day 5 days of the week, and can make measurable gains during the admission.  Patient will also benefit from the  coordinated team approach during an Inpatient Acute Rehabilitation admission.  The patient will receive intensive therapy as well as Rehabilitation physician, nursing, social worker, and care management interventions.  Due to bladder management, bowel management, safety, skin/wound care, disease management, medication administration, pain management, and patient education the patient requires 24 hour Oliver day rehabilitation nursing.  The patient is currently mod to max assist with mobility and basic ADLs.  Discharge setting and therapy post discharge at home with home health is anticipated.  Patient has agreed to participate in the Acute Inpatient Rehabilitation Program and will admit today.  Preadmission Screen Completed By:  Cleatrice Burke, 04/04/2022 12:18 PM ______________________________________________________________________   Discussed status with Dr. Ranell Patrick on 04/04/22 at 1232 and received approval for admission  today.  Admission Coordinator:  Cleatrice Burke, RN, time  1232 Date 04/04/22   Assessment/Plan: Diagnosis: Spinal cord infarction Does the need for close, 24 hr/day Medical supervision in concert with the patient's rehab needs make it unreasonable for this patient to be served in Oliver less intensive setting? Yes Co-Morbidities requiring supervision/potential complications: Bilateral lower extremity weakness, pancytopenia, CAD, essential HTN, history of CVA Due to bladder management, bowel management, safety, skin/wound care, disease management, medication administration, pain management, and patient education, does the patient require 24 hr/day rehab nursing? Yes Does the patient require coordinated care of Oliver physician, rehab nurse, PT, OT to address physical and functional deficits in the context of the above medical diagnosis(es)? Yes Addressing deficits in the following areas: balance, endurance, locomotion, strength, transferring, bowel/bladder control, bathing, dressing, feeding, grooming, toileting, and psychosocial support Can the patient actively participate in an intensive therapy program of at least 3 hrs of therapy 5 days Oliver week? Yes The potential for patient to make measurable gains while on inpatient rehab is excellent Anticipated functional outcomes upon discharge from inpatient rehab: min assist PT, min assist OT, independent SLP Estimated rehab length of stay to reach the above functional goals is: 20-22 days Anticipated discharge destination: Home 10. Overall Rehab/Functional Prognosis: excellent   MD Signature: Leeroy Cha, MD

## 2022-04-04 NOTE — H&P (Shared)
Physical Medicine and Rehabilitation Admission H&P    Chief Complaint  Patient presents with   Functional decline.     HPI:  Austin Oliver. [name] with history of cirrhosis, HTN, OSA, SSS s/p PPM, CAD s/p stent, COPD, R-TKR 10/2021, COVID, recent admissions bilateral hemispheric cryptogenic stroke Jan 11/19/21 and 04//30/23 felt to be due to hypotension/diarrhea who was readmitted on 03/25/22 with falls X 2, RLE weakness with numbness and inability to walk. Foley in place since last stroke due to urinary retention and was changed out at admission.  UDS negative. 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 MRI cervical, lumbar and thoracic spine showed abnormal cord signal at T3 and T4 level suggestive of cord ischemia v/s transverse myelitis, several areas of abnormal abnormal T1 marrow signal/contrast enhancement worrisome for metastatic disease or lymphoma.  CT chest/abdomen/pelvis was negative for bony lesions, numerous hypodensities throughout the liver c/w metastatic disease, splenomegaly with indeterminate hypodensities c/w metastases, bilateral adrenal thickening suspicious for metastatic disease as well as diffuse lymphadenopathy within upper abdomen and retroperitoneum.  Dr. Quinn Axe following for input and felt patient with lesion due to  spinal cord infarct over transverse myelitis but multiple recent strokes due to hypercoagulable state from underlying malignancy.   Liver biopsy ordered and IR recommended holding plavix for 5 days. Plans for liver biopsy on 05/19.  PT/OT has been working with patient and he continues to be limited by BLE weakness and working on standing in [employer] walker for 2-3 minutes. CIR recommended due to functional decline.    Review of Systems  Constitutional:  Negative for chills and fever.  HENT:  Positive for hearing loss.    Eyes:  Negative for blurred vision.  Respiratory:  Negative for cough and shortness of breath.   Cardiovascular:  Negative for chest pain and leg swelling.  Gastrointestinal:  Negative for abdominal pain, constipation, heartburn and nausea.  Genitourinary:  Negative for dysuria.  Musculoskeletal:  Negative for back pain, myalgias and neck pain.  Skin:  Negative for rash.  Neurological:  Positive for sensory change (LLE with decreased sensation from below the knee, RLE with numbness from knee down, saddle anesthesia, band like area around hips.) and weakness (Weakness RLE>LLE). Negative for dizziness and headaches.  Psychiatric/Behavioral:  Negative for memory loss. The patient is not nervous/anxious.     Past Medical History:  Diagnosis Date   Cirrhosis of liver ([place])    COPD (chronic obstructive pulmonary disease) ([place])    Coronary artery calcification seen on CAT scan 01/14/2017   Essential hypertension 09/11/2020   GERD (gastroesophageal reflux disease)    History of kidney stones    Hyperlipidemia    Hypothyroidism    OSA on CPAP    Mild with AHI 7.8/hr >>on CPAP   Osteoarthritis    Presence of permanent cardiac pacemaker    Shingles 2012    Past Surgical History:  Procedure Laterality Date   CORONARY STENT INTERVENTION N/A 08/24/2020   Procedure: CORONARY STENT INTERVENTION;  Surgeon: [name], MD;  Location: [facility];  Service: Cardiovascular;  Laterality: N/A;   HEEL SPUR SURGERY Left 80's   INTRAVASCULAR PRESSURE WIRE/FFR STUDY N/A 04/25/2021   Procedure: INTRAVASCULAR PRESSURE WIRE/FFR STUDY;  Surgeon: [name], MD;  Location: [facility];  Service: Cardiovascular;  Laterality: N/A;   INTRAVASCULAR ULTRASOUND/IVUS N/A 08/24/2020   Procedure: Intravascular Ultrasound/IVUS;  Surgeon: [name], MD;  Location: [facility];  Service: Cardiovascular;  Laterality: N/A;   KNEE ARTHROPLASTY Right 10/31/2021   Procedure: COMPUTER  ASSISTED TOTAL KNEE ARTHROPLASTY;  Surgeon: Rod Can, MD;  Location: [facility];  Service: Orthopedics;  Laterality: Right;   KNEE SURGERY Bilateral    numerous times   LEFT HEART CATH AND CORONARY ANGIOGRAPHY N/A 08/24/2020   Procedure: LEFT HEART CATH AND CORONARY ANGIOGRAPHY;  Surgeon: [name], MD;  Location: [facility];  Service: Cardiovascular;  Laterality: N/A;   LEFT HEART CATH AND CORONARY ANGIOGRAPHY N/A 04/25/2021   Procedure: LEFT HEART CATH AND CORONARY ANGIOGRAPHY;  Surgeon: [name], MD;  Location: [facility];  Service: Cardiovascular;  Laterality: N/A;   NECK SURGERY  70's   PACEMAKER IMPLANT N/A 12/04/2020   Procedure: PACEMAKER IMPLANT;  Surgeon: [name], MD;  Location: [facility];  Service: Cardiovascular;  Laterality: N/A;   TOTAL HIP ARTHROPLASTY Left 03/09/2017   Procedure: LEFT TOTAL HIP ARTHROPLASTY ANTERIOR APPROACH;  Surgeon: Rod Can, MD;  Location: [facility];  Service: Orthopedics;  Laterality: Left;  Dr. requesting RNFA    Family History  Problem Relation Age of Onset   Dementia Mother    Heart Problems Father    COPD Father    Lung cancer Father     Social History:  Married. Retired. He reports that he quit smoking about 5 years ago. His smoking use included cigarettes. He has a 90.00 pack-year smoking history. He has never used smokeless tobacco. He reports that he does not drink alcohol and does not use drugs.   Allergies  Allergen Reactions   Bee Venom Swelling    Extreme Swelling of site     Cleocin [Clindamycin Hcl] Rash    RASH IN BETWEEN FINGERS   Statins Other (See Comments)    Muscles aches with several statins    Medications Prior to Admission  Medication Sig Dispense Refill   acetaminophen (TYLENOL) 325 MG tablet Take 1-2 tablets (325-650 mg total) by mouth every 6 (six) hours as needed for mild pain (pain score 1-3 or temp > 100.5).     aspirin EC 81 MG tablet Take 81 mg by mouth every  evening. Swallow whole.     cholecalciferol (VITAMIN D3) 25 MCG (1000 UNIT) tablet Take 1,000 Units by mouth daily.     clopidogrel (PLAVIX) 75 MG tablet Take 1 tablet (75 mg total) by mouth daily. 90 tablet 1   ezetimibe (ZETIA) 10 MG tablet Take 10 mg by mouth in the morning.     finasteride (PROSCAR) 5 MG tablet Take 5 mg by mouth at bedtime.     gabapentin (NEURONTIN) 100 MG capsule Take 200 mg by mouth at bedtime.     levothyroxine (SYNTHROID) 112 MCG tablet Take 112 mcg by mouth daily before breakfast.     nitroGLYCERIN (NITROSTAT) 0.4 MG SL tablet Place 1 tablet (0.4 mg total) under the tongue every 5 (five) minutes x 3 doses as needed for chest pain. 25 tablet 4   pantoprazole (PROTONIX) 40 MG tablet Take 40 mg by mouth daily after supper.      ranolazine (RANEXA) 1000 MG SR tablet TAKE 1  BY MOUTH TWICE DAILY (Patient taking differently: Take 1,000 mg by mouth 2 (two) times daily.) 180 tablet 2   rosuvastatin (CRESTOR) 5 MG tablet Take 1 tablet (5 mg total) by mouth daily. 90 tablet 1   shark liver oil-cocoa butter (PREPARATION  H) 0.25-3-85.5 % suppository Place 1 suppository rectally daily as needed for hemorrhoids.     tamsulosin (FLOMAX) 0.4 MG CAPS capsule Take 0.8 mg by mouth daily with supper.     Tiotropium Bromide Monohydrate (SPIRIVA RESPIMAT) 2.5 MCG/ACT AERS Inhale 2 puffs into the lungs daily. 12 g 3   vitamin B-12 (CYANOCOBALAMIN) 1000 MCG tablet Take 1,000 mcg by mouth in the morning.     albuterol (VENTOLIN HFA) 108 (90 Base) MCG/ACT inhaler Inhale 2 puffs into the lungs every 6 (six) hours as needed for shortness of breath or wheezing. (Patient not taking: Reported on 03/25/2022)     blood glucose meter kit and supplies KIT Dispense based on patient and insurance preference. Use up to four times daily as directed. 1 each 0   furosemide (LASIX) 20 MG tablet Take 1 tablet (20 mg total) by mouth daily as needed for fluid or edema. (Patient not taking: Reported on 03/25/2022)      loteprednol (LOTEMAX) 0.5 % ophthalmic suspension Place 1 drop into the left eye daily as needed (if having eye pain from shingles). (Patient not taking: Reported on 03/25/2022)     psyllium (METAMUCIL) 58.6 % powder Take 1 packet by mouth 3 (three) times daily as needed (constipation). (Patient not taking: Reported on 03/25/2022)        Home: Home Living Family/patient expects to be discharged to:: Inpatient rehab Living Arrangements: Spouse/significant other Available Help at Discharge: Family, Available 24 hours/day (wife cannot physically assist) Type of Home: House Home Access: Ramped entrance Entrance Stairs-Rails: None Home Layout: Multi-level, Able to live on main level with bedroom/bathroom Bathroom Shower/Tub: Multimedia programmer: Standard Home Equipment: Conservation officer, nature (2 wheels), BSC/3in1, [employer] - single point, [employer], Grab bars - toilet, Grab bars - tub/shower, Wheelchair - manual Additional Comments: pt in OPPT   Functional History: Prior Function Prior Level of Function : Needs assist Mobility Comments: Has been using RW but did have falls prior to weakness. Used WC since falling in shower. Has needed some assist with transfers and mobility tasks ADLs Comments: Was independent prior to weakness. Needed assist since d/c from hospital  Functional Status:  Mobility: Bed Mobility Overal bed mobility: Needs Assistance Bed Mobility: Rolling, Sidelying to Sit Rolling: Min assist Sidelying to sit: Min assist Supine to sit: HOB elevated, Mod assist Sit to supine: Min assist Sit to sidelying: Min assist, Mod assist, +2 for safety/equipment General bed mobility comments: up via R elbow and scooted to EOB all with min. Transfers Overall transfer level: Needs assistance Equipment used:  (EVA walker) Transfers: Sit to/from Stand, Bed to chair/wheelchair/BSC Sit to Stand: +2 safety/equipment, Mod assist Bed to/from chair/wheelchair/BSC transfer type::  Lateral/scoot transfer  Lateral/Scoot Transfers: Min assist Transfer via Lift Equipment:  (EVA walker) General transfer comment: sit to stand x2 for up to 3+ min each trial in standing during peri care and working on upright stand, w/shifts.  scoot transfer to the L with minimal boost to bridge the gap between the chair and bed. (xith 2-3 min  in standing.)      ADL: ADL Overall ADL's : Needs assistance/impaired Eating/Feeding: Independent, Bed level Grooming: Brushing hair, Sitting Upper Body Bathing: Sitting, Minimal assistance Upper Body Bathing Details (indicate cue type and reason): min A to wash back Lower Body Bathing: Minimal assistance, Sitting/lateral leans, Sit to/from stand Lower Body Bathing Details (indicate cue type and reason): to wash feet Upper Body Dressing : Set up, Sitting Upper Body Dressing Details (indicate cue  type and reason): to don gown Lower Body Dressing: Maximal assistance, Sit to/from stand, Sitting/lateral leans Lower Body Dressing Details (indicate cue type and reason): to don socks Toilet Transfer: Moderate assistance, +2 for physical assistance Toilet Transfer Details (indicate cue type and reason): simulated to chair with use of Stedy Toileting- Clothing Manipulation and Hygiene: +2 for physical assistance, Total assistance, Sit to/from stand Functional mobility during ADLs:  (unable to ambulate)  Cognition: Cognition Overall Cognitive Status: Within Functional Limits for tasks assessed Arousal/Alertness: Awake/alert Orientation Level: Oriented X4 Year: 2023 Month: May Day of Week: Correct Attention: Focused, Sustained Focused Attention: Appears intact Sustained Attention: Impaired Sustained Attention Impairment: Verbal complex Memory: Impaired Memory Impairment: Retrieval deficit, Decreased short term memory (Immediate: 5/5 with repetition; delayed: 5/5; with additional processing time) Awareness: Appears intact Problem Solving:  Impaired Problem Solving Impairment: Verbal complex (Money: 3/3; time: 1/1 with cues) Executive Function: Sequencing, Technical brewer: Impaired Sequencing Impairment: Verbal complex (clock: 2/4) Organizing: Appears intact Cognition Arousal/Alertness: Awake/alert Behavior During Therapy: WFL for tasks assessed/performed Overall Cognitive Status: Within Functional Limits for tasks assessed General Comments: HOH   Blood pressure (!) 101/59, pulse 73, temperature 98.6 F (37 C), temperature source Oral, resp. rate 16, height _0  (1.753 m), weight 95.3 kg, SpO2 94 %. Physical Exam Vitals and nursing note reviewed.  Constitutional:      Appearance: Normal appearance.  Neurological:     Mental Status: He is alert and oriented to person, place, and time.     Comments: Speech clear. Able to answer orientation questions and follow simple commands without difficulty.     Results for orders placed or performed during the hospital encounter of 03/25/22 (from the past 48 hour(s))  Basic metabolic panel     Status: Abnormal   Collection Time: 04/03/22  4:11 AM  Result Value Ref Range   Sodium 134 (L) 135 - 145 mmol/L   Potassium 4.2 3.5 - 5.1 mmol/L   Chloride 98 98 - 111 mmol/L   CO2 27 22 - 32 mmol/L   Glucose, Bld 125 (H) 70 - 99 mg/dL    Comment: Glucose reference range applies only to samples taken after fasting for at least 8 hours.   BUN 21 8 - 23 mg/dL   Creatinine, Ser 1.16 0.61 - 1.24 mg/dL   Calcium 9.0 8.9 - 10.3 mg/dL   GFR, Estimated >60 >60 mL/min    Comment: (NOTE) Calculated using the CKD-EPI Creatinine Equation (2021)    Anion gap 9 5 - 15    Comment: Performed at [facility] [address], [place], [place] [address]  CBC     Status: Abnormal   Collection Time: 04/03/22  4:11 AM  Result Value Ref Range   WBC 3.3 (L) 4.0 - 10.5 K/uL   RBC 3.74 (L) 4.22 - 5.81 MIL/uL   Hemoglobin 11.1 (L) 13.0 - 17.0 g/dL   HCT 32.9 (L) 39.0 - 52.0 %   MCV 88.0 80.0  - 100.0 fL   MCH 29.7 26.0 - 34.0 pg   MCHC 33.7 30.0 - 36.0 g/dL   RDW 15.9 (H) 11.5 - 15.5 %   Platelets 125 (L) 150 - 400 K/uL   nRBC 0.0 0.0 - 0.2 %    Comment: Performed at [facility] [address], [place], [place] [address]  Magnesium     Status: None   Collection Time: 04/03/22  4:11 AM  Result Value Ref Range   Magnesium 1.9 1.7 -  2.4 mg/dL    Comment: Performed at [facility], [place] [address], [place], [place] [address]  Basic metabolic panel     Status: Abnormal   Collection Time: 04/04/22  2:50 AM  Result Value Ref Range   Sodium 136 135 - 145 mmol/L   Potassium 4.1 3.5 - 5.1 mmol/L   Chloride 100 98 - 111 mmol/L   CO2 23 22 - 32 mmol/L   Glucose, Bld 98 70 - 99 mg/dL    Comment: Glucose reference range applies only to samples taken after fasting for at least 8 hours.   BUN 24 (H) 8 - 23 mg/dL   Creatinine, Ser 1.11 0.61 - 1.24 mg/dL   Calcium 8.7 (L) 8.9 - 10.3 mg/dL   GFR, Estimated >60 >60 mL/min    Comment: (NOTE) Calculated using the CKD-EPI Creatinine Equation (2021)    Anion gap 13 5 - 15    Comment: Performed at [facility] [address] [place], [place] [address]  CBC     Status: Abnormal   Collection Time: 04/04/22  2:50 AM  Result Value Ref Range   WBC 3.3 (L) 4.0 - 10.5 K/uL   RBC 3.53 (L) 4.22 - 5.81 MIL/uL   Hemoglobin 10.3 (L) 13.0 - 17.0 g/dL   HCT 30.9 (L) 39.0 - 52.0 %   MCV 87.5 80.0 - 100.0 fL   MCH 29.2 26.0 - 34.0 pg   MCHC 33.3 30.0 - 36.0 g/dL   RDW 16.1 (H) 11.5 - 15.5 %   Platelets 115 (L) 150 - 400 K/uL    Comment: Immature Platelet Fraction may be clinically indicated, consider ordering this additional test [id number] REPEATED TO VERIFY    nRBC 0.0 0.0 - 0.2 %    Comment: Performed at [facility], [place] [address], [place], [place] [address]  Magnesium     Status: None   Collection Time: 04/04/22  2:50 AM  Result Value Ref Range   Magnesium 1.9 1.7 - 2.4 mg/dL    Comment: Performed at [facility] [address], [place], [place] [address]   No results found.    Blood pressure (!) 101/59, pulse 73, temperature 98.6 F (37 C), temperature source Oral, resp. rate 16, height _0  (1.753 m), weight 95.3 kg, SpO2 94 %.  Medical Problem List and Plan: 1. Functional deficits secondary to ***  -patient may *** shower  -ELOS/Goals: *** 2.  Antithrombotics: -DVT/anticoagulation:  Pharmaceutical: Lovenox  -antiplatelet therapy: DAPT on hold for procedure 3. Pain Management:  Tylenol prn.  4. Mood: LCSW to follow for evaluation and support.   -antipsychotic agents: N/A 5. Neuropsych: This patient is capable of making decisions on her own behalf. 6. Skin/Wound Care: Routine pressure relief measures.  7. Fluids/Electrolytes/Nutrition: Encourage fluid intake. Recheck CMET in am.  8. CAD/SSS s/p ICD/PPM: On Zetia and Ranexa--ASA/Plavix on hold for procedure 9. T2DM: Hgb A1c has dropped from 6.8-->5.4 since 11/19/21.  --has been off metformin since last admission.  --continue to  monitor BS ac/hs and [place] SSI for elevated BS.   10: Anemia of chronic disease: Recheck CBC in am.  11. Thrombocytopenia: Has been 121 since 04/30-->115. --off ASA/Plavix --monitor for signs of bleeding.  12. Pre-renal azotemia: Encourage fluid intake.  64. Concerns of metastatic disease: Liver biopsy 04/07/22 for work up 14. Urinary retention: Foley in place.  Continue Flomax/Proscar.  15. COPD: Respiratory status stable. Will change Yupelri to MDI per patient request.    ***  Bary Leriche, PA-C 04/04/2022

## 2022-04-04 NOTE — H&P (Signed)
Physical Medicine and Rehabilitation Admission H&P    Chief Complaint  Patient presents with   Functional decline.     HPI:  Austin Oliver. [name] with history of cirrhosis, HTN, OSA, SSS s/p PPM, CAD s/p stent, COPD, R-TKR 10/2021, COVID, recent admissions bilateral hemispheric cryptogenic stroke Jan 11/19/21 and 04//30/23 felt to be due to hypotension/diarrhea who was readmitted on 03/25/22 with falls X 2, RLE weakness with numbness and inability to walk. Foley in place since last stroke due to urinary retention and was changed out at admission.  UDS negative. 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 MRI cervical, lumbar and thoracic spine showed abnormal cord signal at T3 and T4 level suggestive of cord ischemia v/s transverse myelitis, several areas of abnormal abnormal T1 marrow signal/contrast enhancement worrisome for metastatic disease or lymphoma.  CT chest/abdomen/pelvis was negative for bony lesions, numerous hypodensities throughout the liver c/w metastatic disease, splenomegaly with indeterminate hypodensities c/w metastases, bilateral adrenal thickening suspicious for metastatic disease as well as diffuse lymphadenopathy within upper abdomen and retroperitoneum.  Dr. Quinn Axe following for input and felt patient with lesion due to  spinal cord infarct over transverse myelitis but multiple recent strokes due to hypercoagulable state from underlying malignancy.   Liver biopsy ordered and IR recommended holding plavix for 5 days. Plans for liver biopsy on 05/19.  PT/OT has been working with patient and he continues to be limited by BLE weakness and working on standing in [employer] walker for 2-3 minutes. CIR recommended due to functional decline. He currently complains of lower>upper extremity weakness.    Review of Systems  Constitutional:  Negative  for chills and fever.  HENT:  Positive for hearing loss.   Eyes:  Negative for blurred vision.  Respiratory:  Negative for cough and shortness of breath.   Cardiovascular:  Negative for chest pain and leg swelling.  Gastrointestinal:  Negative for abdominal pain, constipation, heartburn and nausea.  Genitourinary:  Negative for dysuria.  Musculoskeletal:  Negative for back pain, myalgias and neck pain.  Skin:  Negative for rash.  Neurological:  Positive for sensory change (LLE with decreased sensation from below the knee, RLE with numbness from knee down, saddle anesthesia, band like area around hips.) and weakness (Weakness RLE>LLE). Negative for dizziness and headaches.  Psychiatric/Behavioral:  Negative for memory loss. The patient is not nervous/anxious.     Past Medical History:  Diagnosis Date   Cirrhosis of liver ([facility])    COPD (chronic obstructive pulmonary disease) ([place])    Coronary artery calcification seen on CAT scan 01/14/2017   Essential hypertension 09/11/2020   GERD (gastroesophageal reflux disease)    History of kidney stones    Hyperlipidemia    Hypothyroidism    OSA on CPAP    Mild with AHI 7.8/hr >>on CPAP   Osteoarthritis    Presence of permanent cardiac pacemaker    Shingles 2012    Past Surgical History:  Procedure Laterality Date   CORONARY STENT INTERVENTION N/A 08/24/2020   Procedure: CORONARY STENT INTERVENTION;  Surgeon: [name], MD;  Location: [facility];  Service: Cardiovascular;  Laterality: N/A;   HEEL SPUR SURGERY Left 80's   INTRAVASCULAR PRESSURE WIRE/FFR STUDY N/A 04/25/2021   Procedure: INTRAVASCULAR PRESSURE WIRE/FFR STUDY;  Surgeon: [name], MD;  Location: [facility];  Service: Cardiovascular;  Laterality: N/A;   INTRAVASCULAR ULTRASOUND/IVUS N/A  08/24/2020   Procedure: Intravascular Ultrasound/IVUS;  Surgeon: [name], MD;  Location: [facility];  Service: Cardiovascular;  Laterality: N/A;    KNEE ARTHROPLASTY Right 10/31/2021   Procedure: COMPUTER ASSISTED TOTAL KNEE ARTHROPLASTY;  Surgeon: Rod Can, MD;  Location: [facility];  Service: Orthopedics;  Laterality: Right;   KNEE SURGERY Bilateral    numerous times   LEFT HEART CATH AND CORONARY ANGIOGRAPHY N/A 08/24/2020   Procedure: LEFT HEART CATH AND CORONARY ANGIOGRAPHY;  Surgeon: [name], MD;  Location: [facility];  Service: Cardiovascular;  Laterality: N/A;   LEFT HEART CATH AND CORONARY ANGIOGRAPHY N/A 04/25/2021   Procedure: LEFT HEART CATH AND CORONARY ANGIOGRAPHY;  Surgeon: [name], MD;  Location: [facility];  Service: Cardiovascular;  Laterality: N/A;   NECK SURGERY  70's   PACEMAKER IMPLANT N/A 12/04/2020   Procedure: PACEMAKER IMPLANT;  Surgeon: [name], MD;  Location: [facility];  Service: Cardiovascular;  Laterality: N/A;   TOTAL HIP ARTHROPLASTY Left 03/09/2017   Procedure: LEFT TOTAL HIP ARTHROPLASTY ANTERIOR APPROACH;  Surgeon: Rod Can, MD;  Location: [facility];  Service: Orthopedics;  Laterality: Left;  Dr. requesting RNFA    Family History  Problem Relation Age of Onset   Dementia Mother    Heart Problems Father    COPD Father    Lung cancer Father     Social History:  Married. Retired. He reports that he quit smoking about 5 years ago. His smoking use included cigarettes. He has a 90.00 pack-year smoking history. He has never used smokeless tobacco. He reports that he does not drink alcohol and does not use drugs.   Allergies  Allergen Reactions   Bee Venom Swelling    Extreme Swelling of site     Cleocin [Clindamycin Hcl] Rash    RASH IN BETWEEN FINGERS   Statins Other (See Comments)    Muscles aches with several statins    Medications Prior to Admission  Medication Sig Dispense Refill   acetaminophen (TYLENOL) 325 MG tablet Take 1-2 tablets (325-650 mg total) by mouth every 6 (six) hours as needed for mild pain (pain score 1-3 or temp >  100.5).     albuterol (VENTOLIN HFA) 108 (90 Base) MCG/ACT inhaler Inhale 2 puffs into the lungs every 6 (six) hours as needed for shortness of breath or wheezing. (Patient not taking: Reported on 03/25/2022)     [START ON 04/08/2022] aspirin EC 81 MG tablet Take 1 tablet (81 mg total) by mouth every evening. Swallow whole. 30 tablet 11   blood glucose meter kit and supplies KIT Dispense based on patient and insurance preference. Use up to four times daily as directed. 1 each 0   cholecalciferol (VITAMIN D3) 25 MCG (1000 UNIT) tablet Take 1,000 Units by mouth daily.     [START ON 04/08/2022] clopidogrel (PLAVIX) 75 MG tablet Take 1 tablet (75 mg total) by mouth daily. 90 tablet 1   ezetimibe (ZETIA) 10 MG tablet Take 10 mg by mouth in the morning.     [START ON 04/05/2022] feeding supplement, GLUCERNA SHAKE, (GLUCERNA SHAKE) LIQD Take 237 mLs by mouth 2 (two) times daily between meals. 10000 mL 0   finasteride (PROSCAR) 5 MG tablet Take 5 mg by mouth at bedtime.     furosemide (LASIX) 20 MG tablet Take 1 tablet (20 mg total) by mouth daily as needed for fluid or edema. (Patient not taking: Reported on 03/25/2022)     gabapentin (  NEURONTIN) 100 MG capsule Take 200 mg by mouth at bedtime.     levothyroxine (SYNTHROID) 112 MCG tablet Take 112 mcg by mouth daily before breakfast.     nitroGLYCERIN (NITROSTAT) 0.4 MG SL tablet Place 1 tablet (0.4 mg total) under the tongue every 5 (five) minutes x 3 doses as needed for chest pain. 25 tablet 4   pantoprazole (PROTONIX) 40 MG tablet Take 40 mg by mouth daily after supper.      polyethylene glycol (MIRALAX / GLYCOLAX) 17 g packet Take 17 g by mouth daily as needed for moderate constipation or mild constipation. 14 each 0   ranolazine (RANEXA) 1000 MG SR tablet TAKE 1  BY MOUTH TWICE DAILY (Patient taking differently: Take 1,000 mg by mouth 2 (two) times daily.) 180 tablet 2   rosuvastatin (CRESTOR) 5 MG tablet Take 1 tablet (5 mg total) by mouth daily. 90 tablet 1    shark liver oil-cocoa butter (PREPARATION H) 0.25-3-85.5 % suppository Place 1 suppository rectally daily as needed for hemorrhoids.     tamsulosin (FLOMAX) 0.4 MG CAPS capsule Take 0.8 mg by mouth daily with supper.     Tiotropium Bromide Monohydrate (SPIRIVA RESPIMAT) 2.5 MCG/ACT AERS Inhale 2 puffs into the lungs daily. 12 g 3   vitamin B-12 (CYANOCOBALAMIN) 1000 MCG tablet Take 1,000 mcg by mouth in the morning.     Home: Home Living Family/patient expects to be discharged to:: Inpatient rehab Living Arrangements: Spouse/significant other Available Help at Discharge: Family, Available 24 hours/day (wife cannot physically assist) Type of Home: House Home Access: Ramped entrance Entrance Stairs-Rails: None Home Layout: Multi-level, Able to live on main level with bedroom/bathroom Bathroom Shower/Tub: Multimedia programmer: Standard Home Equipment: Conservation officer, nature (2 wheels), BSC/3in1, [employer] - single point, [employer], Grab bars - toilet, Grab bars - tub/shower, Wheelchair - manual Additional Comments: pt in OPPT   Functional History: Prior Function Prior Level of Function : Needs assist Mobility Comments: Has been using RW but did have falls prior to weakness. Used WC since falling in shower. Has needed some assist with transfers and mobility tasks ADLs Comments: Was independent prior to weakness. Needed assist since d/c from hospital   Functional Status:  Mobility: Bed Mobility Overal bed mobility: Needs Assistance Bed Mobility: Rolling, Sidelying to Sit Rolling: Min assist Sidelying to sit: Min assist Supine to sit: HOB elevated, Mod assist Sit to supine: Min assist Sit to sidelying: Min assist, Mod assist, +2 for safety/equipment General bed mobility comments: up via R elbow and scooted to EOB all with min. Transfers Overall transfer level: Needs assistance Equipment used:  (EVA walker) Transfers: Sit to/from Stand, Bed to chair/wheelchair/BSC Sit to Stand: +2  safety/equipment, Mod assist Bed to/from chair/wheelchair/BSC transfer type:: Lateral/scoot transfer  Lateral/Scoot Transfers: Min assist Transfer via Lift Equipment:  (EVA walker) General transfer comment: sit to stand x2 for up to 3+ min each trial in standing during peri care and working on upright stand, w/shifts.  scoot transfer to the L with minimal boost to bridge the gap between the chair and bed. (xith 2-3 min  in standing.)   ADL: ADL Overall ADL's : Needs assistance/impaired Eating/Feeding: Independent, Bed level Grooming: Brushing hair, Sitting Upper Body Bathing: Sitting, Minimal assistance Upper Body Bathing Details (indicate cue type and reason): min A to wash back Lower Body Bathing: Minimal assistance, Sitting/lateral leans, Sit to/from stand Lower Body Bathing Details (indicate cue type and reason): to wash feet Upper Body Dressing : Set up, Sitting  Upper Body Dressing Details (indicate cue type and reason): to don gown Lower Body Dressing: Maximal assistance, Sit to/from stand, Sitting/lateral leans Lower Body Dressing Details (indicate cue type and reason): to don socks Toilet Transfer: Moderate assistance, +2 for physical assistance Toilet Transfer Details (indicate cue type and reason): simulated to chair with use of [place]edy Toileting- Clothing Manipulation and Hygiene: +2 for physical assistance, Total assistance, Sit to/from stand Functional mobility during ADLs:  (unable to ambulate)   Cognition: Cognition Overall Cognitive Status: Within Functional Limits for tasks assessed Arousal/Alertness: Awake/alert Orientation Level: Oriented X4 Year: 2023 Month: May Day of Week: Correct Attention: Focused, Sustained Focused Attention: Appears intact Sustained Attention: Impaired Sustained Attention Impairment: Verbal complex Memory: Impaired Memory Impairment: Retrieval deficit, Decreased short term memory (Immediate: 5/5 with repetition; delayed: 5/5; with  additional processing time) Awareness: Appears intact Problem Solving: Impaired Problem Solving Impairment: Verbal complex (Money: 3/3; time: 1/1 with cues) Executive Function: Sequencing, Technical brewer: Impaired Sequencing Impairment: Verbal complex (clock: 2/4) Organizing: Appears intact Cognition Arousal/Alertness: Awake/alert Behavior During Therapy: WFL for tasks assessed/performed Overall Cognitive Status: Within Functional Limits for tasks assessed General Comments: HOH    Blood pressure 109/66, pulse 66, temperature 97.7 F (36.5 C), temperature source Oral, resp. rate 19, height _0  (1.753 m), weight 92.1 kg, SpO2 96 %. Physical Exam Vitals and nursing note reviewed.  Constitutional:      Appearance: Normal appearance.  HEENT; [place], AT Cardio: No murmur Pulm: Breathing comfortably Abdomen: NT, ND Neurological:     Mental Status: He is alert and oriented to person, place, and time.     Comments: Speech clear. Able to answer orientation questions and follow simple commands without difficulty. Unable to lift right lower extremity, LLE 4/5, b/l UE 4/5 with the exception of EE 3/5  Extremities: trace pedal edema present bilaterally.   Results for orders placed or performed during the hospital encounter of 03/25/22 (from the past 48 hour(s))  Basic metabolic panel     Status: Abnormal   Collection Time: 04/03/22  4:11 AM  Result Value Ref Range   Sodium 134 (L) 135 - 145 mmol/L   Potassium 4.2 3.5 - 5.1 mmol/L   Chloride 98 98 - 111 mmol/L   CO2 27 22 - 32 mmol/L   Glucose, Bld 125 (H) 70 - 99 mg/dL    Comment: Glucose reference range applies only to samples taken after fasting for at least 8 hours.   BUN 21 8 - 23 mg/dL   Creatinine, Ser 1.16 0.61 - 1.24 mg/dL   Calcium 9.0 8.9 - 10.3 mg/dL   GFR, Estimated >60 >60 mL/min    Comment: (NOTE) Calculated using the CKD-EPI Creatinine Equation (2021)    Anion gap 9 5 - 15    Comment: Performed at [facility] [address] [place], [place] [address]  CBC     Status: Abnormal   Collection Time: 04/03/22  4:11 AM  Result Value Ref Range   WBC 3.3 (L) 4.0 - 10.5 K/uL   RBC 3.74 (L) 4.22 - 5.81 MIL/uL   Hemoglobin 11.1 (L) 13.0 - 17.0 g/dL   HCT 32.9 (L) 39.0 - 52.0 %   MCV 88.0 80.0 - 100.0 fL   MCH 29.7 26.0 - 34.0 pg   MCHC 33.7 30.0 - 36.0 g/dL   RDW 15.9 (H) 11.5 - 15.5 %   Platelets 125 (L) 150 - 400 K/uL   nRBC 0.0 0.0 - 0.2 %  Comment: Performed at [facility], [place] [address], [place], [place] [address]  Magnesium     Status: None   Collection Time: 04/03/22  4:11 AM  Result Value Ref Range   Magnesium 1.9 1.7 - 2.4 mg/dL    Comment: Performed at [facility] [address], [place], [place] [address]  Basic metabolic panel     Status: Abnormal   Collection Time: 04/04/22  2:50 AM  Result Value Ref Range   Sodium 136 135 - 145 mmol/L   Potassium 4.1 3.5 - 5.1 mmol/L   Chloride 100 98 - 111 mmol/L   CO2 23 22 - 32 mmol/L   Glucose, Bld 98 70 - 99 mg/dL    Comment: Glucose reference range applies only to samples taken after fasting for at least 8 hours.   BUN 24 (H) 8 - 23 mg/dL   Creatinine, Ser 1.11 0.61 - 1.24 mg/dL   Calcium 8.7 (L) 8.9 - 10.3 mg/dL   GFR, Estimated >60 >60 mL/min    Comment: (NOTE) Calculated using the CKD-EPI Creatinine Equation (2021)    Anion gap 13 5 - 15    Comment: Performed at [facility] [address] [place], [place] [address]  CBC     Status: Abnormal   Collection Time: 04/04/22  2:50 AM  Result Value Ref Range   WBC 3.3 (L) 4.0 - 10.5 K/uL   RBC 3.53 (L) 4.22 - 5.81 MIL/uL   Hemoglobin 10.3 (L) 13.0 - 17.0 g/dL   HCT 30.9 (L) 39.0 - 52.0 %   MCV 87.5 80.0 - 100.0 fL   MCH 29.2 26.0 - 34.0 pg   MCHC 33.3 30.0 - 36.0 g/dL   RDW 16.1 (H) 11.5 - 15.5 %   Platelets 115 (L) 150 - 400 K/uL    Comment: Immature Platelet Fraction may be clinically indicated, consider ordering this additional  test [id number] REPEATED TO VERIFY    nRBC 0.0 0.0 - 0.2 %    Comment: Performed at [facility], [place] [address], [place], [place] [address]  Magnesium     Status: None   Collection Time: 04/04/22  2:50 AM  Result Value Ref Range   Magnesium 1.9 1.7 - 2.4 mg/dL    Comment: Performed at [facility] [address], [place], [place] [address]   DG Abd Portable 1V  Result Date: 04/04/2022 CLINICAL DATA:  Constipation, initial encounter EXAM: PORTABLE ABDOMEN - 1 VIEW COMPARISON:  12/30/2021 FINDINGS: Previously administered contrast now lies within the left colon. Retained fecal material is seen without obstructive change. No free air is noted. No acute bony abnormality is noted. IMPRESSION: Changes consistent with colonic constipation. Previously administered contrast remains within the colon 6 days later. Electronically Signed   By: Inez Catalina M.D.   On: 04/04/2022 19:04      Blood pressure 109/66, pulse 66, temperature 97.7 F (36.5 C), temperature source Oral, resp. rate 19, height _0  (1.753 m), weight 92.1 kg, SpO2 96 %.  Medical Problem List and Plan: 1. 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  Antithrombotics: -DVT/anticoagulation:  Pharmaceutical: Lovenox  -antiplatelet therapy: DAPT on hold for procedure 3. Pain Management:  Tylenol prn.  4. Mood: LCSW to follow for evaluation and support.   -antipsychotic agents: N/A 5. Neuropsych: This patient is capable of making decisions on her own behalf.  6. Skin/Wound Care: Routine pressure relief measures.  7. Fluids/Electrolytes/Nutrition: Encourage fluid intake. Recheck CMET in am.  8. CAD/SSS s/p ICD/PPM: On Zetia and Ranexa--ASA/Plavix on hold for procedure 9. T2DM: Hgb A1c has dropped from 6.8-->5.4 since 11/19/21.  --has been off metformin since last admission.  --continue to  monitor BS ac/hs and [place] SSI for elevated BS.    10: Anemia of chronic disease: Recheck CBC in am.  11. Thrombocytopenia: Has been 121 since 04/30-->115. --off ASA/Plavix --monitor for signs of bleeding.  12. Pre-renal azotemia: Encourage fluid intake.  41. Concerns of metastatic disease: Liver biopsy 04/07/22 for work up 14. Urinary retention: Foley in place.  Continue Flomax/Proscar.  15. COPD: Respiratory status stable. Will change Yupelri to MDI per patient request.  16. Right knee buckling: discussed importance of quadricpes strengthening 17. Numbness and tingling of lower extremities: discussed Qutenza as an option outpatient.  18. Post TKA right knee pain: discussed that Qutenza can be tried on knee as well outpatient.    I have personally performed a face to face diagnostic evaluation, including, but not limited to relevant history and physical exam findings, of this patient and developed relevant assessment and plan.  Additionally, I have reviewed and concur with the physician assistant's documentation above.  Bary Leriche, PA-C   Izora Ribas, MD 04/04/2022

## 2022-04-04 NOTE — TOC Transition Note (Signed)
Transition of Care [facility]) - CM/SW Discharge Note   Patient Details  Name: Austin Oliver MRN: [id number] Date of Birth: 10-Apr-1956  Transition of Care [facility]) CM/SW Contact:  Pollie Friar, RN Phone Number: 04/04/2022, 1:01 PM   Clinical Narrative:    Patient discharging to CIR today. Will have liver bx on Monday. No further needs per TOC.   Final next level of care: IP Rehab Facility Barriers to Discharge: No Barriers Identified   Patient Goals and CMS Choice     Choice offered to / list presented to : Patient  Discharge Placement                       Discharge Plan and Services                                     Social Determinants of Health (SDOH) Interventions     Readmission Risk Interventions     View : No data to display.

## 2022-04-04 NOTE — Progress Notes (Signed)
Inpatient Rehabilitation Admission Medication Review by a Pharmacist  A complete drug regimen review was completed for this patient to identify any potential clinically significant medication issues.  High Risk Drug Classes Is patient taking? Indication by Medication  Antipsychotic Yes Compazine- N/V  Anticoagulant Yes Lovenox- VTE prophylaxis  Antibiotic No   Opioid Yes OxyIR- acute pain  Antiplatelet No   Hypoglycemics/insulin No   Vasoactive Medication Yes Ranolazine, flomax- angina, BPH  Chemotherapy No   Other Yes Zetia- HLD Proscar- BPH Synthroid- hypothyroidism Protonix- GERD Trazodone- sleep     Type of Medication Issue Identified Description of Issue Recommendation(s)  Drug Interaction(s) (clinically significant)     Duplicate Therapy     Allergy     No Medication Administration End Date     Incorrect Dose     Additional Drug Therapy Needed     Significant med changes from prior encounter (inform family/care partners about these prior to discharge).    Other  PTA meds: Plavix Aspirin Crestor Vitamin D + B-12 Spiriva Restart PTA meds when and if necessary during CIR admission or at time of discharge, if warranted     Clinically significant medication issues were identified that warrant physician communication and completion of prescribed/recommended actions by midnight of the next day:  No   Time spent performing this drug regimen review (minutes):  30   Amariyah Bazar BS, PharmD, BCPS Clinical Pharmacist 04/04/2022 2:29 PM  Contact: [phone] after 3 PM  "Be curious, not judgmental..." -Jamal Maes

## 2022-04-04 NOTE — Progress Notes (Signed)
Initial Nutrition Assessment  DOCUMENTATION CODES:   Not applicable  INTERVENTION:   - Glucerna Shake po TID, each supplement provides 220 kcal and 10 grams of protein  - MVI with minerals daily  - Encourage PO intake  NUTRITION DIAGNOSIS:   Increased nutrient needs related to chronic illness (COPD, cirrhosis) as evidenced by estimated needs.  GOAL:   Patient will meet greater than or equal to 90% of their needs  MONITOR:   PO intake, Supplement acceptance, Labs, Weight trends  REASON FOR ASSESSMENT:   Malnutrition Screening Tool    ASSESSMENT:   66 year old male with PMH of recurrent strokes, CAD, COPD, cirrhosis, HTN, HLD, GERD, T2DM, and hypothyroidism who presented to ED with worsening weakness. Pt admitted to acute care with spinal cord infarction, suspected demyelinating changes in brain, occult malignancy. Pt transferred to CIR on 5/19.  Noted pt scheduled for liver biopsy on 04/07/22.  Pt unavailable at time of RD. Pt was just admitted to CIR this afternoon.  Reviewed RD notes from acute care admission. Pt with PO intakes of 100% recorded. Noted pt has been consuming Glucerna supplements without issue. Pt currently with Glucerna supplements ordered BID. Will increase to TID. Will continue daily MVI with minerals.  Reviewed weight history in chart. Weight slowly trending down over the last 6 months. Pt with an 11 kg weight loss since 10/31/21. This is a 10.3% weight loss in just over 5 months which is severe and significant for timeframe. Pt is at risk for malnutrition. Pt did not meet criteria for malnutrition on previous RD assessment on 5/11 but may meet criteria at this time. Unable to determine without NFPE.  Vitamin/Mineral Profile: Vitamin B12: 636 (WNL) on 5/10 Folate B9: 7.5 (WNL) on 5/10 Copper: 141 (WNL) on 5/10 Zinc: 68 (WNL) on 5/10 Ceruloplasmin: 28.7 (WNL) on 5/10 Methylmalonic acid: 123 (WNL) on 5/10  Medications reviewed and include: Glucerna  Shake BID, MVI with minerals, protonix  Labs reviewed: BUN 24, WBC 3.3, platelets 115  NUTRITION - FOCUSED PHYSICAL EXAM:  Unable to complete at this time. Pt unavailable at time of RD visit.  Diet Order:   Diet Order             Diet 2 gram sodium Room service appropriate? Yes; Fluid consistency: Thin  Diet effective now                   EDUCATION NEEDS:   Not appropriate for education at this time  Skin:  Skin Assessment: Reviewed RN Assessment  Last BM:  no documened BM  Height:   Ht Readings from Last 1 Encounters:  04/04/22 '5\' 9"'$  (1.753 m)    Weight:   Wt Readings from Last 1 Encounters:  04/04/22 92.1 kg    BMI:  Body mass index is 29.98 kg/m.  Estimated Nutritional Needs:   Kcal:  2000-2200  Protein:  100-120 grams  Fluid:  2.0 L    Gustavus Bryant, MS, RD, LDN Inpatient Clinical Dietitian Please see AMiON for contact information.

## 2022-04-04 NOTE — Discharge Summary (Signed)
Physician Discharge Summary   Patient: Austin Oliver MRN: [id number] DOB: 1956/06/01  Admit date:     03/25/2022  Discharge date:   Discharge Physician: Berle Mull  PCP: [name], MD  Recommendations at discharge: Hold aspirin and Plavix until liver biopsy, currently scheduled on Monday 04/07/2022. N.p.o. after midnight on 5/21 for biopsy scheduled for 5/22. Follow-up with PCP in 1 week. Follow-up with neurology in 1 month. Follow-up with urology in 1 month. Continue Foley catheter on discharge for neurogenic bladder. Patient will need a referral to hematology pending results of the liver biopsy.  Already sees Dr. Julien Nordmann for pancytopenia.  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 male with medical history significant of recurrent strokes, CAD, COPD, sick sinus syndrome s/p pacemaker placement, cirrhosis with recent admission for stroke with right lower extremity weakness with concern for possible demyelination disease but more likely spinal cord infarct. Also with evidence of metastatic cancer; liver biopsy pending.  Assessment and Plan: * Spinal cord infarction  [facility]) Initial MRI brain correlates with less likely stroke and more likely demyelinating disease.  Neurology was consulted,  MRI brain/cervical spine with contrast confirm previously noted concern for demyelination with possible pituitary adenoma.  MRI thoracic/lumbar spine significant for abnormal cord signal at T3 and T4 levels suggestive of cord ischemia vs transverse myelitis.  Neurology concern mostly for spinal cord infarct leading to symptoms secondary to hypercoagulable state from presumed metastatic cancer.  PT recommending inpatient rehabilitation on discharge.  Currently aspirin and Plavix on hold. On Crestor 5 mg and Zetia.  suspected Demyelinating changes in brain [facility]) MRI brain w/wo contrast showed multifocal chronic infarcts.  Radiology suggested appearance might be consistent with demyelinating disease however per Neurology these lesions showed restricted diffusion on prior admissions c/w acute multifocal ischemic infarcts. Thus no further work up recommended.   History of CVA (cerebrovascular accident) 11/2021  bicerebral multiple embolic infarcts of cryptogenic etiology in the setting of acute COVID infection. Patient is on Aspirin/Plavix, Crestor, Ranexa and Zetia as an outpatient. Plavix and aspirin recommended to be held by IR for biopsy. 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  CT chest/abd/pelvis with masses of the liver, masses of the adrenal glands, abdominal lymphadenopathy. There is also concern for cirrhosis of the liver based on [place] in 2022. Abnormal T1 signal intensity posterior aspect L3 and L4 and and most of the L5 and S1 vertebral bodies with Contrast enhancement, also in the T4 vertebral body without destructive bony lesions.  Not amenable to biopsy.   -IR  consult for biopsy  (Delayed secondary to Aspirin/Plavix and scheduling issues). Plan for biopsy on 5/22.   Pituitary microadenoma (Austin Oliver) MRI brain (5/10) significant for 5 mm hypoenhancing nodule in anterior pituitary consistent with possible micro adenoma. No clinical features currently concerning for hyperfunctioning lesion at this time.   Recurrent falls In setting of recent stroke and concern for demyelinating disease. PT/OT ordered and recommend inpatient rehabiliation.  Chronic retention of urine, due to BPH Chronic issue. Foley exchanged on admission, 5/9. Urinalysis not suggestive of infection. -Continue Foley, finasteride and Flomax. Supposed to follow-up with Dr. Gloriann Loan outpatient although currently hospitalized. Due to concern for development of neurogenic bladder would recommend to continue Foley catheter on discharge at this time around as well.  Avascular necrosis of femoral head, right (Austin Oliver) Noted incidentally on CT imaging. No subchondral collapse. Asymptomatic.  CAD (coronary artery disease) Patient is on Aspirin, Plavix, Ranexa, Crestor as an outpatient. No current chest pain.   Sick sinus syndrome [facility]) S/p pacemaker, [place] MR conditional pacemaker  Essential hypertension Patient is not on medication management as an outpatient.  Controlled type 2 diabetes mellitus without complication, without long-term current use of insulin (HCC) Metformin discontinued at prior hospitalization secondary to diarrhea. Most recent hemoglobin A1C of 5.4%. Well controlled.  COPD (chronic obstructive pulmonary disease) (HCC) Currently asymptomatic.  Continue Yupelri  Obesity (BMI 30-39.9) Body mass index is 31.01 kg/m.  Placing the pt at higher risk of poor outcomes.   AAA (abdominal aortic aneurysm) (HCC) Infrarenal. History of smoking. Currently measuring 3.3 cm. Recommendation for follow-up every 3 years.  Consultants: Neurology, IR Procedures performed:  Echocardiogram  DISCHARGE  MEDICATION: Allergies as of 04/04/2022       Reactions   Bee Venom Swelling   Extreme Swelling of site     Cleocin [clindamycin Hcl] Rash   RASH IN BETWEEN FINGERS   Statins Other (See Comments)   Muscles aches with several statins        Medication List     STOP taking these medications    loteprednol 0.5 % ophthalmic suspension Commonly known as: LOTEMAX   psyllium 58.6 % powder Commonly known as: METAMUCIL       TAKE these medications    acetaminophen 325 MG tablet Commonly known as: TYLENOL Take 1-2 tablets (325-650 mg total) by mouth every 6 (six) hours as needed for mild pain (pain score 1-3 or temp > 100.5).   albuterol 108 (90 Base) MCG/ACT inhaler Commonly known as: VENTOLIN HFA Inhale 2 puffs into the lungs every 6 (six) hours as needed for shortness of breath or wheezing.   aspirin EC 81 MG tablet Take 1 tablet (81 mg total) by mouth every evening. Swallow whole. Start taking on: Apr 08, 2022 What changed: These instructions start on Apr 08, 2022. If you are unsure what to do until then, ask your doctor or other care provider.   blood glucose meter kit and supplies Kit Dispense based on patient and insurance preference. Use up to four times daily as directed.   cholecalciferol 25 MCG (1000 UNIT) tablet Commonly known as: VITAMIN D3 Take 1,000 Units by mouth daily.   clopidogrel 75 MG tablet Commonly known as: PLAVIX Take 1 tablet (75 mg total) by mouth daily. Start taking on: Apr 08, 2022 What changed: These instructions start on Apr 08, 2022. If you are unsure what to do until then, ask your doctor or other care provider.   ezetimibe 10 MG tablet Commonly known as: ZETIA Take  10 mg by mouth in the morning.   feeding supplement (GLUCERNA SHAKE) Liqd Take 237 mLs by mouth 2 (two) times daily between meals. Start taking on: Apr 05, 2022   finasteride 5 MG tablet Commonly known as: PROSCAR Take 5 mg by mouth at bedtime.   furosemide 20 MG  tablet Commonly known as: LASIX Take 1 tablet (20 mg total) by mouth daily as needed for fluid or edema.   gabapentin 100 MG capsule Commonly known as: NEURONTIN Take 200 mg by mouth at bedtime.   levothyroxine 112 MCG tablet Commonly known as: SYNTHROID Take 112 mcg by mouth daily before breakfast.   nitroGLYCERIN 0.4 MG SL tablet Commonly known as: NITROSTAT Place 1 tablet (0.4 mg total) under the tongue every 5 (five) minutes x 3 doses as needed for chest pain.   pantoprazole 40 MG tablet Commonly known as: PROTONIX Take 40 mg by mouth daily after supper.   polyethylene glycol 17 g packet Commonly known as: MIRALAX / GLYCOLAX Take 17 g by mouth daily as needed for moderate constipation or mild constipation.   ranolazine 1000 MG SR tablet Commonly known as: RANEXA TAKE 1  BY MOUTH TWICE DAILY What changed: See the new instructions.   rosuvastatin 5 MG tablet Commonly known as: CRESTOR Take 1 tablet (5 mg total) by mouth daily.   shark liver oil-cocoa butter 0.25-3-85.5 % suppository Commonly known as: PREPARATION H Place 1 suppository rectally daily as needed for hemorrhoids.   Spiriva Respimat 2.5 MCG/ACT Aers Generic drug: Tiotropium Bromide Monohydrate Inhale 2 puffs into the lungs daily.   tamsulosin 0.4 MG Caps capsule Commonly known as: FLOMAX Take 0.8 mg by mouth daily with supper.   vitamin B-12 1000 MCG tablet Commonly known as: CYANOCOBALAMIN Take 1,000 mcg by mouth in the morning.        Follow-up Information     [name], MD. Schedule an appointment as soon as possible for a visit in 1 week(s).   Specialty: Family Medicine Contact information: [place] [address] [phone]         [facility]. Schedule an appointment as soon as possible for a visit in 1 month(s).   Specialty: Neurology Contact information: [address] [place] [phone]        Lucas Mallow, MD. Schedule an appointment as soon as possible for a visit in 1 month(s).   Specialty: Urology Contact information: [place] [phone] [phone]         Curt Bears, MD Follow up.   Specialty: Oncology Why: once biopsy results are back he will need a referral to see hem-onc Contact information: [place] [address] [phone]                Disposition: CIR Diet recommendation: Cardiac diet  Discharge Exam: Vitals:   04/04/22 0401 04/04/22 0846 04/04/22 0900 04/04/22 1100  BP: 99/62 108/70  (!) 101/59  Pulse: 72 70  73  Resp: _0 Temp: 98.6 F (37 C) 98.5 F (36.9 C)  98.6 F (37 C)  TempSrc: Oral Oral  Oral  SpO2: 95% 97% 99% 94%  Weight:      Height:       General: Appear in mild distress; no visible Abnormal Neck Mass Or lumps, Conjunctiva normal Cardiovascular: S1 and S2 Present, no Murmur, Respiratory: good respiratory effort, Bilateral Air entry present and  CTA, no Crackles, no wheezes Abdomen: Bowel Sound present, Non tender  Extremities: trace bilateral Pedal edema Neurology: alert and oriented to time, place, and person Right sided lower extremity weakness unchanged. Unchanged pelvic peritoneal numbness. Bilateral lower extremity numbness below the ankle, left more than right. Gait not checked due to patient safety concerns Filed Weights   03/25/22 1804  Weight: 95.3 kg   Condition at discharge: stable  The results of significant diagnostics from this hospitalization (including imaging, microbiology, ancillary and laboratory) are listed below for reference.   Imaging Studies: CT ANGIO HEAD NECK W WO CM  Result Date: 03/17/2022 CLINICAL DATA:  Neuro deficit, stroke suspected EXAM: CT ANGIOGRAPHY HEAD AND NECK TECHNIQUE: Multidetector CT imaging of the head and neck was performed using the standard protocol during bolus  administration of intravenous contrast. Multiplanar CT image reconstructions and MIPs were obtained to evaluate the vascular anatomy. Carotid stenosis measurements (when applicable) are obtained utilizing NASCET criteria, using the distal internal carotid diameter as the denominator. RADIATION DOSE REDUCTION: This exam was performed according to the departmental dose-optimization program which includes automated exposure control, adjustment of the mA and/or kV according to patient size and/or use of iterative reconstruction technique. CONTRAST:  128m OMNIPAQUE IOHEXOL 350 MG/ML SOLN COMPARISON:  CTA 11/20/2021, CT head 03/16/2022. FINDINGS: CT HEAD FINDINGS Brain: No evidence of acute infarction, hemorrhage, cerebral edema, mass, mass effect, or midline shift. No hydrocephalus or extra-axial fluid collection. Hypodensity in the left parietal white matter and splenium of the corpus callosum correlate with infarcts from the 11/20/2021 MRI. Vascular: No hyperdense vessel. Skull: Normal. Negative for fracture or focal lesion. Sinuses/Orbits: Mucosal thickening in the ethmoid air cells and maxillary sinuses. The orbits are unremarkable. Other: The mastoid air cells are well aerated. CTA NECK FINDINGS Aortic arch: Standard branching. Imaged portion shows no evidence of aneurysm or dissection. No significant stenosis of the major arch vessel origins. Right carotid system: No evidence of dissection, stenosis (50% or greater) or occlusion. Left carotid system: No evidence of dissection, stenosis (50% or greater) or occlusion. Redemonstrated approximately 40% stenosis in the proximal left ICA. Vertebral arteries: Moderate narrowing at the origin of the bilateral vertebral arteries, unchanged. No evidence of dissection or occlusion. Skeleton: Redemonstrated degenerative findings in the cervical spine, with osseous fusion of C5 and C6. No acute osseous abnormality. Other neck: Negative. Upper chest: Focal pulmonary opacity or  pleural effusion. Redemonstrated left chest cardiac device with leads extending off the inferior field of view. Review of the MIP images confirms the above findings CTA HEAD FINDINGS Anterior circulation: Both internal carotid arteries are patent to the termini, without significant stenosis. A1 segments patent. Normal anterior communicating artery. Anterior cerebral arteries are patent to their distal aspects, with a hypoplastic right ACA. No M1 stenosis or occlusion. Normal MCA bifurcations. Distal MCA branches perfused and symmetric. Posterior circulation: Vertebral arteries patent to the vertebrobasilar junction without stenosis. Posterior inferior cerebral arteries patent bilaterally. Basilar patent to its distal aspect. Superior cerebellar arteries patent bilaterally. Patent P1 segments. Redemonstrated severe stenosis and near occlusion of the left PCA in the proximal P2 segment (series 11, image 111), with poor perfusion of the left PCA distally. Right PCA branches are perfused to their distal aspects without focal stenosis. The bilateral posterior communicating arteries are not visualized. Venous sinuses: As permitted by contrast timing, patent. Redemonstrated hypoplastic right transverse sinus, with dominant left transverse sinus Anatomic variants: None significant. Review of the MIP images confirms the above findings IMPRESSION: 1.  No  acute intracranial process. 2. No intracranial large vessel occlusion. Unchanged severe stenosis in the proximal left P2 segment with poor perfusion of the remainder of the left PCA. 3. 40% stenosis in the proximal left ICA, unchanged. 4. Moderate stenosis of the origin of the bilateral vertebral arteries, which are otherwise patent. Electronically Signed   By: Merilyn Baba M.D.   On: 03/17/2022 01:46   DG Thoracic Spine 2 View  Result Date: 03/25/2022 CLINICAL DATA:  Back pain EXAM: THORACIC SPINE 2 VIEWS COMPARISON:  None Available. FINDINGS: No recent fracture is seen.  Alignment of posterior margins of vertebral bodies is unremarkable. There is fusion of bodies of C5 and C6 vertebrae. Severe degenerative changes are noted in the lower cervical spine at C6-C7 level. Small bony spurs seen in the lower thoracic spine. Paraspinal soft tissues are unremarkable. Pacemaker battery is seen in the left infraclavicular region. IMPRESSION: No recent fracture is seen. Degenerative changes are noted in the lower cervical spine and thoracic spine. Electronically Signed   By: Elmer Picker M.D.   On: 03/25/2022 20:23   DG Lumbar Spine 2-3 Views  Result Date: 03/25/2022 CLINICAL DATA:  Back pain EXAM: LUMBAR SPINE - 3 VIEW COMPARISON:  Radiographs and MRI done on 11/20/2021 FINDINGS: Last lumbar vertebra is transitional suggesting sacralization of L5 vertebra. No recent fracture is seen. Alignment of posterior margins of vertebral bodies is unremarkable. Degenerative changes are noted in lumbar spine with bony spurs and facet hypertrophy, more severe at L4-L5 level. There is previous left hip arthroplasty. There is 14 mm calcific density overlying the right kidney. IMPRESSION: No recent fracture is seen.  Degenerative changes are noted. There is 14 mm calcific density overlying the right kidney suggesting right renal stone. Electronically Signed   By: Elmer Picker M.D.   On: 03/25/2022 20:21   CT HEAD WO CONTRAST (5MM)  Result Date: 03/25/2022 CLINICAL DATA:  Neuro deficit, acute, stroke suspected worsening R leg weakness, hx of recurrent strokes EXAM: CT HEAD WITHOUT CONTRAST TECHNIQUE: Contiguous axial images were obtained from the base of the skull through the vertex without intravenous contrast. RADIATION DOSE REDUCTION: This exam was performed according to the departmental dose-optimization program which includes automated exposure control, adjustment of the mA and/or kV according to patient size and/or use of iterative reconstruction technique. COMPARISON:  MRI head  03/17/2022, CT 03/16/2022 FINDINGS: Brain: No evidence of acute intracranial hemorrhage or extra-axial collection.No evidence of mass lesion/concerning mass effect.The ventricles are normal in size.There is periventricular and subcortical white matter hypoattenuation, nonspecific and similar in distribution to recent MRI. Vascular: No hyperdense vessel. Skull: Negative for skull fracture. Sinuses/Orbits: Mild ethmoid air cell mucosal thickening. Other: None. IMPRESSION: No new acute findings on noncontrast CT. Similar distribution of white matter disease in comparison to recent MRI, nonspecific but suggestive of demyelinating process and/or microvascular ischemic disease. Electronically Signed   By: Maurine Simmering M.D.   On: 03/25/2022 15:54   CT HEAD WO CONTRAST  Result Date: 03/16/2022 CLINICAL DATA:  Neuro deficit, acute, stroke suspected EXAM: CT HEAD WITHOUT CONTRAST TECHNIQUE: Contiguous axial images were obtained from the base of the skull through the vertex without intravenous contrast. RADIATION DOSE REDUCTION: This exam was performed according to the departmental dose-optimization program which includes automated exposure control, adjustment of the mA and/or kV according to patient size and/or use of iterative reconstruction technique. COMPARISON:  11/20/2021 FINDINGS: Brain: There is no acute intracranial hemorrhage, mass effect, or edema. Gray-white differentiation is preserved. Patchy hypoattenuation in  the supratentorial white matter likely reflecting a combination of chronic microvascular ischemic changes and sequelae of infarcts on the prior MRI. A small superimposed acute infarction is difficult to exclude without an interval baseline CT comparison. There is no extra-axial fluid collection. Ventricles and sulci are within normal limits in size and configuration. Vascular: No hyperdense vessel.There is atherosclerotic calcification at the skull base. Skull: Calvarium is unremarkable. Sinuses/Orbits:  No acute finding. Other: None. IMPRESSION: No acute intracranial hemorrhage. No acute infarction. An acute small vessel infarct superimposed on chronic changes is difficult to exclude. Electronically Signed   By: Macy Mis M.D.   On: 03/16/2022 12:22   MR BRAIN WO CONTRAST  Result Date: 03/17/2022 CLINICAL DATA:  Neuro deficit, acute, stroke suspected. EXAM: MRI HEAD WITHOUT CONTRAST TECHNIQUE: Multiplanar, multiecho pulse sequences of the brain and surrounding structures were obtained without intravenous contrast. COMPARISON:  CT studies done yesterday and today.  MRI 11/20/2021. FINDINGS: Brain: There are multiple newly seen foci of abnormal T2 and FLAIR signal and restricted diffusion present within the white matter of the cerebral hemispheres and to a lesser extent the cerebellum. Findings are more consistent with active demyelinating disease rather than ischemic infarctions. Consideration could be given to multiple etiologies of demyelination including multiple sclerosis, toxic demyelination and autoimmune encephalitis. No evidence of hemorrhage, hydrocephalus or extra-axial collection. Vascular: Major vessels at the base of the brain show flow. Skull and upper cervical spine: Negative Sinuses/Orbits: Mucosal inflammatory changes of the maxillary sinuses. Orbits negative. Other: None IMPRESSION: Progression of white matter disease since the study of 11/20/2021 with a pattern suggesting a demyelinating process rather than ischemic infarctions. Consider multiple sclerosis, toxic demyelination syndromes and autoimmune encephalitis. Electronically Signed   By: Nelson Chimes M.D.   On: 03/17/2022 16:04   MR BRAIN W WO CONTRAST  Result Date: 03/27/2022 CLINICAL DATA:  Acute neuro deficit. Rule out stroke or demyelinating disease. EXAM: MRI HEAD WITHOUT AND WITH CONTRAST TECHNIQUE: Multiplanar, multiecho pulse sequences of the brain and surrounding structures were obtained without and with intravenous  contrast. CONTRAST:  44m GADAVIST GADOBUTROL 1 MMOL/ML IV SOLN COMPARISON:  MRI head 03/17/2022, 11/20/2021 FINDINGS: Brain: Bilateral white matter hyperintensities are stable since most recent study but appear progressive since 11/20/2021. Lesions are most prominent in the left frontal and parietal white matter with several lesions perpendicular to the ventricular surface. Deep white matter lesions on the right extending to the right frontal horn also show mild progression since 11/20/2021. Lesion in the right brachium pontis unchanged. Many these lesions show increased signal on diffusion-weighted imaging compatible with T2 shine through. Involvement of the splenium unchanged. T1 shortening with T1 hyperintensity in the left occipital cortex on axial image 32 is unchanged the prior study. Likely an area of chronic hemorrhage. 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 This is not seen on the prior studies as contrast not administered. No compression of the optic chiasm. Vascular: Normal arterial flow voids Skull and upper cervical spine: Negative Sinuses/Orbits: Mucosal edema paranasal sinuses.  Negative orbit Other: None IMPRESSION: White matter changes are similar in extent to the most recent MRI but progressive since 11/20/2021. Favor demyelinating disease over ischemia for most of these lesions 5 mm hypoenhancing nodule in the anterior pituitary. 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 Correlate with pituitary function tests. Electronically Signed   By: CFranchot GalloM.D.   On: 03/27/2022 16:01   MR CERVICAL SPINE W WO CONTRAST  Result Date: 03/27/2022 CLINICAL DATA:  Cervical myelopathy.  Possible  CSF leak. EXAM: MRI CERVICAL SPINE WITHOUT AND WITH CONTRAST TECHNIQUE: Multiplanar and multiecho pulse sequences of the cervical spine, to include the craniocervical junction and cervicothoracic junction, were obtained without and with intravenous contrast. CONTRAST:  33m GADAVIST GADOBUTROL 1  MMOL/ML IV SOLN COMPARISON:  Radiographs 08/11/2018 FINDINGS: Alignment: Straightening of the normal cervical lordosis Elise in part due to a interbody fusion at C5-6. The vertebral bodies are otherwise normally aligned. Vertebrae: No bone lesions or fractures. Cord: Normal cervical cord signal intensity. No cord lesions or syrinx. At the very bottom of the sagittal images there appears to be some signal abnormality in the thoracic cord at the T3-4 level. This also could be artifact but a thoracic spine MRI may be helpful for further evaluation depending on the clinical situation. Posterior Fossa, vertebral arteries, paraspinal tissues: No significant findings. The visualized brainstem and cerebellum appear unremarkable. No Chiari malformation. Disc levels: C2-3: No significant findings. C3-4: Bilateral facet disease and mild uncinate spurring changes contributing to mild foraminal narrowing. C4-5: Bulging annulus, osteophytic ridging, uncinate spurring and facet disease. There is flattening of the ventral thecal sac and narrowing the ventral CSF space but no significant spinal stenosis. There is moderate left foraminal stenosis. No right-sided foraminal stenosis. C5-6: Interbody fusion changes. Mild posterior osteophytic ridging. No significant spinal or foraminal stenosis. C6-7: Degenerative disc disease with bulging annulus, osteophytic ridging, uncinate spurring and facet disease. This contributes to mild spinal and moderate left foraminal stenosis. C7-T1: No significant findings. IMPRESSION: 1. Interbody fusion changes at CK9-1without complicating features. No spinal or foraminal stenosis. 2. Mild spinal and moderate left foraminal stenosis at C4-5. 3. Mild spinal and moderate left foraminal stenosis at C6-7. 4. Possible signal abnormality in the thoracic cord at the very bottom of the sagittal images at the T3-4 level. This could be artifact but a thoracic spine MRI may be helpful for further evaluation  depending on the clinical situation. Electronically Signed   By: PMarijo SanesM.D.   On: 03/27/2022 16:03   MR THORACIC SPINE W WO CONTRAST  Result Date: 03/28/2022 CLINICAL DATA:  Acute myelopathy. EXAM: MRI THORACIC AND LUMBAR SPINE WITHOUT AND WITH CONTRAST TECHNIQUE: Multiplanar and multiecho pulse sequences of the thoracic and lumbar spine were obtained without and with intravenous contrast. CONTRAST:  9.540mGADAVIST GADOBUTROL 1 MMOL/ML IV SOLN COMPARISON:  MRI cervical spine without contrast. FINDINGS: MRI THORACIC SPINE FINDINGS Alignment:  Normal Vertebrae: Abnormal T1 and T2 signal intensity in the T4 vertebral body along with contrast enhancement. Possible T1 signal abnormality in the T1 vertebral body also. Could not exclude metastatic disease. Patient may metastatic workup. Cord: Cord signal abnormality in the upper thoracic cord extending from the T3-4 disc space to the bottom of T5. Cord is also slightly thickened in this area. I do not see any definite evidence of hemorrhage. No obvious enhancement to suggest a neoplastic process. This could be ischemic change or possibly transverse myelitis. The remainder of the thoracic cord is. Conus medullaris terminates at L1. Paraspinal and other soft tissues: No obvious lung mass or mediastinal/hilar mass or adenopathy. The aorta is grossly normal. In the upper abdomen there appear to be bilateral adrenal gland lesions and a very prominent caudate lobe of the liver which could suggest cirrhosis. Disc levels: No focal thoracic disc protrusions, spinal or foraminal stenosis. MRI LUMBAR SPINE FINDINGS Segmentation: There is partial sacralization of L5 with small disc space. Alignment:  Normal Vertebrae: Abnormal T1 signal intensity posterior aspect L3 and L4 and  and most of the L5 and S1 vertebral bodies. Contrast enhancement is also demonstrated. Conus medullaris: Extends to the L1 level and appears normal. Paraspinal and other soft tissues: Borderline  retroperitoneal lymph nodes. Bilateral adrenal lesions. No aortic aneurysm. Disc levels: No significant disc protrusions, spinal or foraminal stenosis. There is a bulging degenerated annulus at L4-5 along with a shallow central disc protrusion. Moderate multilevel facet disease. IMPRESSION: 1. Abnormal cord signal intensity at the T3 and T4 levels. Findings suggest cord ischemia or other process such as transverse myelitis. No enhancement to suggest neoplastic process. 2. There are several areas of abnormal T1 marrow signal and contrast enhancement worrisome for metastatic disease or lymphoma. Patient may need metastatic workup. There are also bilateral adrenal gland lesions and borderline retroperitoneal lymph nodes. 3. Electronically Signed   By: Marijo Sanes M.D.   On: 03/28/2022 18:13   MR Lumbar Spine W Wo Contrast  Result Date: 03/28/2022 CLINICAL DATA:  Acute myelopathy. EXAM: MRI THORACIC AND LUMBAR SPINE WITHOUT AND WITH CONTRAST TECHNIQUE: Multiplanar and multiecho pulse sequences of the thoracic and lumbar spine were obtained without and with intravenous contrast. CONTRAST:  9.72m GADAVIST GADOBUTROL 1 MMOL/ML IV SOLN COMPARISON:  MRI cervical spine without contrast. FINDINGS: MRI THORACIC SPINE FINDINGS Alignment:  Normal Vertebrae: Abnormal T1 and T2 signal intensity in the T4 vertebral body along with contrast enhancement. Possible T1 signal abnormality in the T1 vertebral body also. Could not exclude metastatic disease. Patient may metastatic workup. Cord: Cord signal abnormality in the upper thoracic cord extending from the T3-4 disc space to the bottom of T5. Cord is also slightly thickened in this area. I do not see any definite evidence of hemorrhage. No obvious enhancement to suggest a neoplastic process. This could be ischemic change or possibly transverse myelitis. The remainder of the thoracic cord is. Conus medullaris terminates at L1. Paraspinal and other soft tissues: No obvious lung  mass or mediastinal/hilar mass or adenopathy. The aorta is grossly normal. In the upper abdomen there appear to be bilateral adrenal gland lesions and a very prominent caudate lobe of the liver which could suggest cirrhosis. Disc levels: No focal thoracic disc protrusions, spinal or foraminal stenosis. MRI LUMBAR SPINE FINDINGS Segmentation: There is partial sacralization of L5 with small disc space. Alignment:  Normal Vertebrae: Abnormal T1 signal intensity posterior aspect L3 and L4 and and most of the L5 and S1 vertebral bodies. Contrast enhancement is also demonstrated. Conus medullaris: Extends to the L1 level and appears normal. Paraspinal and other soft tissues: Borderline retroperitoneal lymph nodes. Bilateral adrenal lesions. No aortic aneurysm. Disc levels: No significant disc protrusions, spinal or foraminal stenosis. There is a bulging degenerated annulus at L4-5 along with a shallow central disc protrusion. Moderate multilevel facet disease. IMPRESSION: 1. Abnormal cord signal intensity at the T3 and T4 levels. Findings suggest cord ischemia or other process such as transverse myelitis. No enhancement to suggest neoplastic process. 2. There are several areas of abnormal T1 marrow signal and contrast enhancement worrisome for metastatic disease or lymphoma. Patient may need metastatic workup. There are also bilateral adrenal gland lesions and borderline retroperitoneal lymph nodes. 3. Electronically Signed   By: PMarijo SanesM.D.   On: 03/28/2022 18:13   CT CHEST ABDOMEN PELVIS W CONTRAST  Result Date: 03/29/2022 CLINICAL DATA:  Possible metastatic disease within the thoracolumbar spine EXAM: CT CHEST, ABDOMEN, AND PELVIS WITH CONTRAST TECHNIQUE: Multidetector CT imaging of the chest, abdomen and pelvis was performed following the standard protocol during  bolus administration of intravenous contrast. RADIATION DOSE REDUCTION: This exam was performed according to the departmental dose-optimization  program which includes automated exposure control, adjustment of the mA and/or kV according to patient size and/or use of iterative reconstruction technique. CONTRAST:  1102m OMNIPAQUE IOHEXOL 300 MG/ML  SOLN COMPARISON:  03/27/2022, 03/28/2022, 10/08/2021 FINDINGS: CT CHEST FINDINGS Cardiovascular: The heart and great vessels are unremarkable without pericardial effusion. Dual lead pacer unchanged. No evidence of thoracic aortic aneurysm or dissection. Stable atherosclerosis of the aorta and coronary vasculature. Mediastinum/Nodes: No enlarged mediastinal, hilar, or axillary lymph nodes. Thyroid gland, trachea, and esophagus demonstrate no significant findings. Lungs/Pleura: Bilateral partially calcified less than 4 mm pulmonary nodules are unchanged since prior study consistent with granulomas. No new pulmonary nodules or masses. No acute airspace disease, effusion, or pneumothorax. Central airways are patent. Musculoskeletal: There are no acute displaced fractures. The abnormality of the T4 vertebral body seen on prior thoracic spine MRI is not apparent by CT. No destructive bony lesions. Reconstructed images demonstrate no additional findings. CT ABDOMEN PELVIS FINDINGS Hepatobiliary: There are multiple hypodense lesions seen throughout the liver, consistent with metastatic disease. Index lesion right lobe image 50/3 measures 2.0 x 1.6 cm. No biliary duct dilation. Small gallstone within the gallbladder neck without evidence of acute cholecystitis. Pancreas: Unremarkable. No pancreatic ductal dilatation or surrounding inflammatory changes. Spleen: Spleen is enlarged measuring 15.7 by 15.6 x 5.8 cm. The splenic parenchyma is heterogeneous, with multiple hypodensities measuring up to 1.3 cm reference image 62. Adrenals/Urinary Tract: Bilateral adrenal thickening, measuring up to 1.3 cm on the right and 1.5 cm on the left, suspicious for metastatic disease given the additional findings. The kidneys enhance normally  and symmetrically. Nonobstructing 13 mm calculus mid right kidney. No obstructive uropathy within either kidney. Bladder is only minimally distended, with nonspecific bladder wall thickening. Foley catheter is identified, with small amount of gas in the bladder lumen consistent with catheterization. Stomach/Bowel: No bowel obstruction or ileus. Normal appendix right lower quadrant. No bowel wall thickening or inflammatory change. Vascular/Lymphatic: Multiple enlarged lymph nodes within the upper abdomen and retroperitoneum. 14 mm short axis lymph node within the gastrohepatic ligament image 57/3. Retroperitoneal adenopathy, with index lymph node measuring 13 mm in short axis in the aortocaval region image 80/3. 3.3 cm infrarenal abdominal aortic aneurysm. Moderate atherosclerosis throughout the aorta and its branches. Reproductive: Prostate is unremarkable. Other: No free fluid or free intraperitoneal gas. No abdominal wall hernia. Musculoskeletal: Unremarkable left hip arthroplasty. 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 There are no acute displaced fractures. The abnormalities of the L3 through S1 vertebral body seen on prior MRI are not readily apparent by CT. No destructive bony lesions are identified. Reconstructed images demonstrate no additional findings. IMPRESSION: 1. The suspected metastatic disease within the T4 vertebral body and L3 through S1 vertebral bodies on prior MRI is occult by CT. No destructive bony lesions. 2. Numerous hypodensities throughout the liver consistent with metastatic disease. 3. Splenomegaly, with indeterminate hypodensities consistent with metastases. 4. Bilateral adrenal thickening, suspicious for adrenal metastases. 5. Diffuse lymphadenopathy within the upper abdomen and retroperitoneum, consistent with metastatic disease or lymphoma. 6. 3.3 cm infrarenal abdominal aortic aneurysm. Recommend follow-up every 3 years. Reference: J  Am Coll Radiol 25188;41:660-630 7. No acute intrathoracic process. Bilateral sub 4 mm pulmonary nodules are partially calcified, consistent with benign nodules based on stability and imaging characteristics. 8. 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 Electronically Signed   By:  Randa Ngo M.D.   On: 03/29/2022 19:46   DG CHEST PORT 1 VIEW  Result Date: 03/25/2022 CLINICAL DATA:  Right-sided weakness EXAM: PORTABLE CHEST 1 VIEW COMPARISON:  11/20/2021 FINDINGS: Lungs are well expanded, symmetric, and clear. No pneumothorax or pleural effusion. Cardiac size within normal limits. 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 Osseous structures are age-appropriate. No acute bone abnormality. IMPRESSION: No active disease. Electronically Signed   By: Fidela Salisbury M.D.   On: 03/25/2022 19:26   DG Knee Right Port  Result Date: 03/25/2022 CLINICAL DATA:  Fall, history of knee replacement EXAM: PORTABLE RIGHT KNEE - 1-2 VIEW COMPARISON:  Knee radiographs 10/31/2021 FINDINGS: Postsurgical changes reflecting right knee arthroplasty are again seen. There is no evidence of acute fracture or dislocation. There is no evidence of hardware related complication. There is a small suprapatellar effusion. IMPRESSION: Acute fracture or dislocation.  Small effusion. Electronically Signed   By: Valetta Mole M.D.   On: 03/25/2022 16:16   EEG adult  Result Date: 03/17/2022 Derek Jack, MD     03/17/2022  7:38 AM Routine EEG Report CYPRUS KUANG is a 66 y.o. male with a history of TIAs who is undergoing an EEG to evaluate for seizures. Report: This EEG was acquired with electrodes placed according to the International 10-20 electrode system (including Fp1, Fp2, F3, F4, C3, C4, P3, P4, O1, O2, T3, T4, T5, T6, A1, A2, Fz, Cz, Pz). The following electrodes were missing or displaced: none. The occipital dominant rhythm was 9-10 Hz. This activity is  reactive to stimulation. Drowsiness was manifested by background fragmentation; deeper stages of sleep were not identified. There was no focal slowing. There were no interictal epileptiform discharges. There were no electrographic seizures identified. Photic stimulation and hyperventilation were not performed. Impression and clinical correlation: This EEG was obtained while awake and drowsy and is normal. One episode of right hand shaking was captured which had no clear ictal correlate on EEG. Surface EEG may not pick up small focal motor seizures, therefore if clinical concern for seizures persists consider additional EEG monitoring.   Su Monks, MD [facility] [phone] If 7pm- 7am, please page neurology on call as listed in [place].   VAS [place] LOWER EXTREMITY VENOUS (DVT) (ONLY [facility] & [facility])  Result Date: 03/25/2022  Lower Venous DVT Study Patient Name:  SHAQUAN MISSEY  Date of Exam:   03/25/2022 Medical Rec #: [id number]       Accession #:    [id number] Date of Birth: 12/12/1955       Patient Gender: M Patient Age:   35 years Exam Location:  [facility] Procedure:      VAS [place] LOWER EXTREMITY VENOUS (DVT) Referring Phys: DAVID YAO --------------------------------------------------------------------------------  Indications: Right leg weakness and pain.  Comparison Study: 11-21-2021 Prior bilateral lower extremity venous was negative                   for DVT. Performing Technologist: Darlin Coco RDMS, RVT  Examination Guidelines: A complete evaluation includes B-mode imaging, spectral Doppler, color Doppler, and power Doppler as needed of all accessible portions of each vessel. Bilateral testing is considered an integral part of a complete examination. Limited examinations for reoccurring indications may be performed as noted. The reflux portion of the exam is performed with the patient in reverse Trendelenburg.  +---------+---------------+---------+-----------+----------+--------------+ RIGHT     CompressibilityPhasicitySpontaneityPropertiesThrombus Aging +---------+---------------+---------+-----------+----------+--------------+ CFV      Full  Yes      Yes                                 +---------+---------------+---------+-----------+----------+--------------+ SFJ      Full                                                        +---------+---------------+---------+-----------+----------+--------------+ FV Prox  Full                                                        +---------+---------------+---------+-----------+----------+--------------+ FV Mid   Full                                                        +---------+---------------+---------+-----------+----------+--------------+ FV DistalFull                                                        +---------+---------------+---------+-----------+----------+--------------+ PFV      Full                                                        +---------+---------------+---------+-----------+----------+--------------+ POP      Full           Yes      Yes                                 +---------+---------------+---------+-----------+----------+--------------+ PTV      Full                                                        +---------+---------------+---------+-----------+----------+--------------+ PERO     Full                                                        +---------+---------------+---------+-----------+----------+--------------+ Gastroc  Full                                                        +---------+---------------+---------+-----------+----------+--------------+   +----+---------------+---------+-----------+----------+--------------+ LEFTCompressibilityPhasicitySpontaneityPropertiesThrombus Aging +----+---------------+---------+-----------+----------+--------------+ CFV Full  Yes      Yes                                  +----+---------------+---------+-----------+----------+--------------+     Summary: RIGHT: - There is no evidence of deep vein thrombosis in the lower extremity.  - Appearance of mild effusion of right knee, also visualized on x-ray.  LEFT: - No evidence of common femoral vein obstruction.  *See table(s) above for measurements and observations. Electronically signed by Deitra Mayo MD on 03/25/2022 at 6:54:00 PM.    Final     Microbiology: Results for orders placed or performed during the hospital encounter of 03/25/22  Resp Panel by RT-PCR (Flu A&B, Covid) Nasopharyngeal Swab     Status: None   Collection Time: 03/25/22  2:48 PM   Specimen: Nasopharyngeal Swab; Nasopharyngeal(NP) swabs in vial transport medium  Result Value Ref Range Status   SARS Coronavirus 2 by RT PCR NEGATIVE NEGATIVE Final    Comment: (NOTE) SARS-CoV-2 target nucleic acids are NOT DETECTED.  The SARS-CoV-2 RNA is generally detectable in upper respiratory specimens during the acute phase of infection. The lowest concentration of SARS-CoV-2 viral copies this assay can detect is 138 copies/mL. A negative result does not preclude SARS-Cov-2 infection and should not be used as the sole basis for treatment or other patient management decisions. A negative result may occur with  improper specimen collection/handling, submission of specimen other than nasopharyngeal swab, presence of viral mutation(s) within the areas targeted by this assay, and inadequate number of viral copies(<138 copies/mL). A negative result must be combined with clinical observations, patient history, and epidemiological information. The expected result is Negative.  Fact Sheet for Patients:  EntrepreneurPulse.com.au  Fact Sheet for Healthcare Providers:  IncredibleEmployment.be  This test is no t yet approved or cleared by the [place] FDA and  has been authorized for detection and/or diagnosis of  SARS-CoV-2 by FDA under an Emergency Use Authorization (EUA). This EUA will remain  in effect (meaning this test can be used) for the duration of the COVID-19 declaration under Section 564(b)(1) of the Act, 21 U.S.C.section 360bbb-3(b)(1), unless the authorization is terminated  or revoked sooner.       Influenza A by PCR NEGATIVE NEGATIVE Final   Influenza B by PCR NEGATIVE NEGATIVE Final    Comment: (NOTE) The Xpert Xpress SARS-CoV-2/FLU/RSV plus assay is intended as an aid in the diagnosis of influenza from Nasopharyngeal swab specimens and should not be used as a sole basis for treatment. Nasal washings and aspirates are unacceptable for Xpert Xpress SARS-CoV-2/FLU/RSV testing.  Fact Sheet for Patients: EntrepreneurPulse.com.au  Fact Sheet for Healthcare Providers: IncredibleEmployment.be  This test is not yet approved or cleared by the [place] FDA and has been authorized for detection and/or diagnosis of SARS-CoV-2 by FDA under an Emergency Use Authorization (EUA). This EUA will remain in effect (meaning this test can be used) for the duration of the COVID-19 declaration under Section 564(b)(1) of the Act, 21 U.S.C. section 360bbb-3(b)(1), unless the authorization is terminated or revoked.  Performed at [facility], [place] [address] [place], [place] [address]   Urine Culture     Status: Abnormal   Collection Time: 03/25/22  3:53 PM   Specimen: Urine, Catheterized  Result Value Ref Range Status   Specimen Description URINE, CATHETERIZED  Final   Special Requests   Final    NONE Performed at [facility], [place] [address] [place], [place] [address]    Culture 10,000 COLONIES/mL STAPHYLOCOCCUS EPIDERMIDIS (A)  Final   Report Status 03/27/2022 FINAL  Final   Organism ID, Bacteria STAPHYLOCOCCUS EPIDERMIDIS (A)  Final      Susceptibility   Staphylococcus epidermidis - MIC*    CIPROFLOXACIN <=0.5 SENSITIVE Sensitive      GENTAMICIN <=0.5 SENSITIVE Sensitive     NITROFURANTOIN <=16 SENSITIVE Sensitive     OXACILLIN 1 RESISTANT Resistant     TETRACYCLINE 2 SENSITIVE Sensitive     VANCOMYCIN 2 SENSITIVE Sensitive     TRIMETH/SULFA 20 SENSITIVE Sensitive     CLINDAMYCIN 4 RESISTANT Resistant     RIFAMPIN <=0.5 SENSITIVE Sensitive     Inducible Clindamycin NEGATIVE Sensitive     * 10,000 COLONIES/mL STAPHYLOCOCCUS EPIDERMIDIS  Urine Culture     Status: Abnormal   Collection Time: 03/25/22  6:53 PM   Specimen: Urine, Clean Catch  Result Value Ref Range Status   Specimen Description URINE, CLEAN CATCH  Final   Special Requests   Final    NONE Performed at [facility], [place] [address] [place], [place] [address]    Culture 30,000 COLONIES/mL STAPHYLOCOCCUS EPIDERMIDIS (A)  Final   Report Status 03/31/2022 FINAL  Final   Organism ID, Bacteria STAPHYLOCOCCUS EPIDERMIDIS (A)  Final      Susceptibility   Staphylococcus epidermidis - MIC*    CIPROFLOXACIN <=0.5 SENSITIVE Sensitive     GENTAMICIN <=0.5 SENSITIVE Sensitive     NITROFURANTOIN <=16 SENSITIVE Sensitive     OXACILLIN >=4 RESISTANT Resistant     TETRACYCLINE 2 SENSITIVE Sensitive     VANCOMYCIN 2 SENSITIVE Sensitive     TRIMETH/SULFA 20 SENSITIVE Sensitive     CLINDAMYCIN 4 RESISTANT Resistant     RIFAMPIN <=0.5 SENSITIVE Sensitive     Inducible Clindamycin NEGATIVE Sensitive     * 30,000 COLONIES/mL STAPHYLOCOCCUS EPIDERMIDIS    Labs: CBC: Recent Labs  Lab 03/29/22 0031 03/30/22 0802 04/03/22 0411 04/04/22 0250  WBC 3.2* 3.1* 3.3* 3.3*  NEUTROABS 1.7 1.6*  --   --   HGB 11.0* 11.3* 11.1* 10.3*  HCT 34.0* 33.9* 32.9* 30.9*  MCV 89.9 88.3 88.0 87.5  PLT 124* 130* 125* 211*   Basic Metabolic Panel: Recent Labs  Lab 03/30/22 0802 04/01/22 0522 04/03/22 0411 04/04/22 0250  NA 135  --  134* 136  K 4.5  --  4.2 4.1  CL 100  --  98 100  CO2 26  --  27 23  GLUCOSE 125*  --  125* 98  BUN 23  --  21 24*  CREATININE 1.24  1.25* 1.16 1.11  CALCIUM 8.8*  --  9.0 8.7*  MG  --   --  1.9 1.9   Liver Function Tests: No results for input(s): AST, ALT, ALKPHOS, BILITOT, PROT, ALBUMIN in the last 168 hours. CBG: No results for input(s): GLUCAP in the last 168 hours.  Discharge time spent: greater than 30 minutes.  Signed: Berle Mull, MD [facility]

## 2022-04-05 DIAGNOSIS — R339 Retention of urine, unspecified: Secondary | ICD-10-CM

## 2022-04-05 DIAGNOSIS — E875 Hyperkalemia: Secondary | ICD-10-CM

## 2022-04-05 DIAGNOSIS — K5900 Constipation, unspecified: Secondary | ICD-10-CM

## 2022-04-05 LAB — COMPREHENSIVE METABOLIC PANEL
ALT: 27 U/L (ref 0–44)
AST: 38 U/L (ref 15–41)
Albumin: 2.4 g/dL — ABNORMAL LOW (ref 3.5–5.0)
Alkaline Phosphatase: 133 U/L — ABNORMAL HIGH (ref 38–126)
Anion gap: 11 (ref 5–15)
BUN: 22 mg/dL (ref 8–23)
CO2: 27 mmol/L (ref 22–32)
Calcium: 9.2 mg/dL (ref 8.9–10.3)
Chloride: 97 mmol/L — ABNORMAL LOW (ref 98–111)
Creatinine, Ser: 1.29 mg/dL — ABNORMAL HIGH (ref 0.61–1.24)
GFR, Estimated: >60 mL/min (ref >60)
Glucose, Bld: 108 mg/dL — ABNORMAL HIGH (ref 70–99)
Potassium: 5.2 mmol/L — ABNORMAL HIGH (ref 3.5–5.1)
Sodium: 135 mmol/L (ref 135–145)
Total Bilirubin: 2.4 mg/dL — ABNORMAL HIGH (ref 0.3–1.2)
Total Protein: 6 g/dL — ABNORMAL LOW (ref 6.5–8.1)

## 2022-04-05 LAB — CBC WITH DIFFERENTIAL/PLATELET
Abs Immature Granulocytes: 0.05 10*3/uL (ref 0.00–0.07)
Basophils Absolute: 0 10*3/uL (ref 0.0–0.1)
Basophils Relative: 1 %
Eosinophils Absolute: 0.1 10*3/uL (ref 0.0–0.5)
Eosinophils Relative: 2 %
HCT: 31.3 % — ABNORMAL LOW (ref 39.0–52.0)
Hemoglobin: 10.2 g/dL — ABNORMAL LOW (ref 13.0–17.0)
Immature Granulocytes: 2 %
Lymphocytes Relative: 20 %
Lymphs Abs: 0.7 10*3/uL (ref 0.7–4.0)
MCH: 28.7 pg (ref 26.0–34.0)
MCHC: 32.6 g/dL (ref 30.0–36.0)
MCV: 87.9 fL (ref 80.0–100.0)
Monocytes Absolute: 0.8 10*3/uL (ref 0.1–1.0)
Monocytes Relative: 22 %
Neutro Abs: 1.8 10*3/uL (ref 1.7–7.7)
Neutrophils Relative %: 53 %
Platelets: 127 10*3/uL — ABNORMAL LOW (ref 150–400)
RBC: 3.56 MIL/uL — ABNORMAL LOW (ref 4.22–5.81)
RDW: 16.1 % — ABNORMAL HIGH (ref 11.5–15.5)
WBC: 3.4 10*3/uL — ABNORMAL LOW (ref 4.0–10.5)
nRBC: 0 % (ref 0.0–0.2)

## 2022-04-05 LAB — GLUCOSE, CAPILLARY
Glucose-Capillary: 95 mg/dL (ref 70–99)
Glucose-Capillary: 155 mg/dL — ABNORMAL HIGH (ref 70–99)
Glucose-Capillary: 117 mg/dL — ABNORMAL HIGH (ref 70–99)

## 2022-04-05 LAB — MAGNESIUM: Magnesium: 1.9 mg/dL (ref 1.7–2.4)

## 2022-04-05 MED ORDER — SODIUM POLYSTYRENE SULFONATE 15 GM/60ML PO SUSP
30.0000 g | Freq: Once | ORAL | Status: AC
Start: 2022-04-05 — End: 2022-04-05
  Administered 2022-04-05: 30 g via ORAL
  Filled 2022-04-05: qty 120

## 2022-04-05 MED ORDER — TAMSULOSIN HCL 0.4 MG PO CAPS
0.8000 mg | ORAL_CAPSULE | Freq: Every day | ORAL | Status: DC
  Administered 2022-04-05 – 2022-04-14 (×10): 0.8 mg via ORAL
  Filled 2022-04-05 (×10): qty 2

## 2022-04-05 NOTE — Evaluation (Signed)
Physical Therapy Assessment and Plan  Patient Details  Name: Austin Oliver MRN: [id number] Date of Birth: 08-Jun-1956  PT Diagnosis: Coordination disorder, Difficulty walking, Hemiparesis non-dominant, Hemiplegia dominant, Impaired sensation, Low back pain, and Muscle weakness Rehab Potential: Fair ELOS: 3-4 weeks   Today's Date: 04/05/2022 PT Individual Time: 1011-1108 PT Individual Time Calculation (min): 58 min    Hospital Problem: Principal Problem:   Spinal cord infarction [facility])   Past Medical History:  Past Medical History:  Diagnosis Date   Cirrhosis of liver ([place])    COPD (chronic obstructive pulmonary disease) ([place])    Coronary artery calcification seen on CAT scan 01/14/2017   Essential hypertension 09/11/2020   GERD (gastroesophageal reflux disease)    History of kidney stones    Hyperlipidemia    Hypothyroidism    OSA on CPAP    Mild with AHI 7.8/hr >>on CPAP   Osteoarthritis    Presence of permanent cardiac pacemaker    Shingles 2012   Past Surgical History:  Past Surgical History:  Procedure Laterality Date   CORONARY STENT INTERVENTION N/A 08/24/2020   Procedure: CORONARY STENT INTERVENTION;  Surgeon: [name], MD;  Location: [place] [facility];  Service: Cardiovascular;  Laterality: N/A;   HEEL SPUR SURGERY Left 80's   INTRAVASCULAR PRESSURE WIRE/FFR STUDY N/A 04/25/2021   Procedure: INTRAVASCULAR PRESSURE WIRE/FFR STUDY;  Surgeon: [name], MD;  Location: [place] [facility];  Service: Cardiovascular;  Laterality: N/A;   INTRAVASCULAR ULTRASOUND/IVUS N/A 08/24/2020   Procedure: Intravascular Ultrasound/IVUS;  Surgeon: [name], MD;  Location: [place] [facility];  Service: Cardiovascular;  Laterality: N/A;   KNEE ARTHROPLASTY Right 10/31/2021   Procedure: COMPUTER ASSISTED TOTAL KNEE ARTHROPLASTY;  Surgeon: Rod Can, MD;  Location: [facility];  Service: Orthopedics;  Laterality: Right;   KNEE SURGERY Bilateral    numerous times    LEFT HEART CATH AND CORONARY ANGIOGRAPHY N/A 08/24/2020   Procedure: LEFT HEART CATH AND CORONARY ANGIOGRAPHY;  Surgeon: [name], MD;  Location: [place] [facility];  Service: Cardiovascular;  Laterality: N/A;   LEFT HEART CATH AND CORONARY ANGIOGRAPHY N/A 04/25/2021   Procedure: LEFT HEART CATH AND CORONARY ANGIOGRAPHY;  Surgeon: [name], MD;  Location: [place] [facility];  Service: Cardiovascular;  Laterality: N/A;   NECK SURGERY  70's   PACEMAKER IMPLANT N/A 12/04/2020   Procedure: PACEMAKER IMPLANT;  Surgeon: [name], MD;  Location: [place] [facility];  Service: Cardiovascular;  Laterality: N/A;   TOTAL HIP ARTHROPLASTY Left 03/09/2017   Procedure: LEFT TOTAL HIP ARTHROPLASTY ANTERIOR APPROACH;  Surgeon: Rod Can, MD;  Location: [place];  Service: Orthopedics;  Laterality: Left;  Dr. requesting RNFA    Assessment & Plan Clinical Impression: Patient is a 66 y.o. male with history of cirrhosis, HTN, OSA, SSS s/p PPM, CAD s/p stent, COPD, R-TKR 10/2021, COVID, recent admissions bilateral hemispheric cryptogenic stroke Jan 11/19/21 and 04//30/23 felt to be due to hypotension/diarrhea who was readmitted on 03/25/22 with falls X 2, RLE weakness with numbness and inability to walk. Foley in place since last stroke due to urinary retention and was changed out at admission.  UDS negative. 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 MRI cervical, lumbar and thoracic spine showed abnormal cord signal at T3 and T4 level suggestive of cord ischemia v/s transverse myelitis, several areas of abnormal abnormal T1 marrow signal/contrast enhancement worrisome  for metastatic disease or lymphoma.   CT chest/abdomen/pelvis was negative for bony lesions, numerous hypodensities throughout the liver c/w metastatic disease, splenomegaly with  indeterminate hypodensities c/w metastases, bilateral adrenal thickening suspicious for metastatic disease as well as diffuse lymphadenopathy within upper abdomen and retroperitoneum.  Dr. Quinn Axe following for input and felt patient with lesion due to  spinal cord infarct over transverse myelitis but multiple recent strokes due to hypercoagulable state from underlying malignancy.   Liver biopsy ordered and IR recommended holding plavix for 5 days. Plans for liver biopsy on 05/19.  PT/OT has been working with patient and he continues to be limited by BLE weakness and working on standing in [employer] walker for 2-3 minutes. CIR recommended due to functional decline. He currently complains of lower>upper extremity weakness.  Patient transferred to CIR on 04/04/2022 .   Patient currently requires mod assist with mobility secondary to muscle weakness, decreased cardiorespiratoy endurance, unbalanced muscle activation and decreased coordination, ,, decreased safety awareness and decreased memory, and decreased sitting balance, decreased standing balance, hemiplegia, and decreased balance strategies.  Prior to hospitalization, patient was min with mobility and lived with Spouse in a House home.  Home access is 1Ramped entrance.  Patient will benefit from skilled PT intervention to maximize safe functional mobility, minimize fall risk, and decrease caregiver burden for planned discharge home with 24 hour supervision.  Anticipate patient will benefit from follow up [place] at discharge.  PT - End of Session Activity Tolerance: Tolerates 10 - 20 min activity with multiple rests Endurance Deficit: Yes Endurance Deficit Description: Multiple rest breaks throughout session with need to return to supine for back pain PT Assessment Rehab Potential (ACUTE/IP ONLY): Fair PT Barriers to Discharge: [place] home environment;Decreased caregiver support;Home environment access/layout;Insurance for SNF coverage;Lack of/limited family  support;Nutrition means;Incontinence;Neurogenic Bowel & Bladder PT Patient demonstrates impairments in the following area(s): Balance;Endurance;Motor;Nutrition;Pain;Safety;Sensory;Skin Integrity PT Transfers Functional Problem(s): Bed Mobility;Bed to Chair;Car;Furniture PT Locomotion Functional Problem(s): Ambulation;Wheelchair Mobility;Stairs PT Plan PT Intensity: Minimum of 1-2 x/day ,45 to 90 minutes PT Frequency: 5 out of 7 days PT Duration Estimated Length of Stay: 3-4 weeks PT Treatment/Interventions: Ambulation/gait training;Community reintegration;DME/adaptive equipment instruction;Neuromuscular re-education;Psychosocial support;Stair training;UE/LE Strength taining/ROM;Wheelchair propulsion/positioning;UE/LE Coordination activities;Therapeutic Activities;Skin care/wound management;Pain management;Functional electrical stimulation;Discharge planning;Balance/vestibular training;Cognitive remediation/compensation;Disease management/prevention;Patient/family education;Splinting/orthotics;Therapeutic Exercise;Visual/perceptual remediation/compensation;Functional mobility training PT Transfers Anticipated Outcome(s): CGA PT Locomotion Anticipated Outcome(s): CGA PT Recommendation Recommendations for Other Services: Therapeutic Recreation consult Therapeutic Recreation Interventions: Kitchen group;Stress management Follow Up Recommendations: Home health PT Patient destination: Home Equipment Recommended: To be determined   PT Evaluation Precautions/Restrictions Precautions Precautions: Fall Precaution Comments: RLE hemiplegia, LLE hemipareisis, Restrictions Weight Bearing Restrictions: No General   Vital Signs  Pain Pain Assessment Pain Scale: 0-10 Pain Score: 2  Pain Type: Chronic pain Pain Location: Back Pain Orientation: Lower Pain Descriptors / Indicators: Aching Pain Intervention(s): Repositioned Pain Interference Pain Interference Pain Effect on Sleep: 2.  Occasionally Pain Interference with Therapy Activities: 2. Occasionally Pain Interference with Day-to-Day Activities: 2. Occasionally Home Living/Prior Functioning Home Living Available Help at Discharge: Family;Available 24 hours/day Type of Home: House Home Access: Ramped entrance Entrance Stairs-Number of Steps: 1 Entrance Stairs-Rails: None Home Layout: Multi-level;Able to live on main level with bedroom/bathroom Bathroom Shower/Tub: Multimedia programmer: Standard Bathroom Accessibility: Yes Additional Comments: Has a wc, RW, and shower seat per pt report  Lives With: Spouse Prior Function Level of Independence: Needs assistance with ADLs;Needs assistance with homemaking;Needs assistance with gait;Needs assistance with tranfers  Able to Take Stairs?: No Driving: No  Vision/Perception  Vision - History Ability to See in Adequate Light: 1 Impaired Vision - Assessment Additional Comments: 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 difficulty tracking up/down and to the L, can track further to the R with cues, however, pt reports his vision is fine. Perception Perception: Impaired Praxis Praxis: Intact  Cognition Overall Cognitive Status: Within Functional Limits for tasks assessed Arousal/Alertness: Awake/alert Orientation Level: Oriented X4 Attention: Focused;Sustained Focused Attention: Appears intact Sustained Attention: Impaired Memory: Impaired Memory Impairment: Retrieval deficit;Decreased short term memory Awareness: Appears intact Problem Solving: Impaired Sequencing: Impaired Safety/Judgment: Impaired Sensation Sensation Light Touch: Impaired by gross assessment Light Touch Impaired Details: Impaired RLE;Impaired LLE Coordination Gross Motor Movements are Fluid and Coordinated: No Fine Motor Movements are Fluid and Coordinated: No Coordination and Movement Description: decreased smoothness and accuracy Heel Shin Test: RLE no movement;  LLE initiates but unable to perform Motor  Motor Motor: Hemiplegia (L hemipareisis) Motor - Skilled Clinical Observations: RLE hemiplegia, LLE hemipareisis   Trunk/Postural Assessment  Cervical Assessment Cervical Assessment: Exceptions to [facility] (slight forward head) Thoracic Assessment Thoracic Assessment: Exceptions to [facility] (rounded shoulders) Lumbar Assessment Lumbar Assessment: Exceptions to [facility] (posterior pelvic tilt in sitting) Postural Control Postural Control: Within Functional Limits (relates inability to maintain balance, however demos no LOB and is able to resist mod perturbations)  Balance Balance Balance Assessed: Yes Standardized Balance Assessment Standardized Balance Assessment: FIST;PASS Postural Assessment Scale for Stroke Patients=PASS 1. Sitting Without Support: Can sit for 5 minutes without support 2. Standing With Support: Can stand with moderate support of 1 person 3. Standing Without Support: Cannot stand without support 4.Standing on Nonparetic Leg: Cannot stand on nonparetic leg 5.Standing on Paretic Leg: Cannot stand on paretic leg MAINTAINING POSTURE SUBTOTAL: 5 6. Supine to Paretic Side Lateral: Can perform without help 7. Supine to Nonparetic Side Lateral: Can perform with much help 8. Supine to Sitting Up on the Edge of the Mat: Can perform with little help 9. Sitting on the Edge of the Mat to Supine: Can perform with much help 10. Sitting to Standing Up: Can perform with much help 11. Standing Up to Sitting Down: Can perform with much help 12. Standing,Picking Up a Pencil from the Floor: Cannot perform CHANGING POSTURE SUBTOTAL: 9 PASS TOTAL SCORE: 14 Function in Sitting Test = FIST Anterior Nudge: superior sternum: Verbal cues/increased time Posterior Nudge: between scapular spines: Verbal cues/increased time Lateral Nudge: to dominant side at acromion: Upper extremity support Static Sitting: 30 seconds: Verbal cues/increased time Sitting, Shake  "No": left and right: Upper extremity support Sitting, Eyes Closed: 30 seconds: Needs assistance Sitting, Eyes Closed Assistance: Min Assist Sitting, Lift Foot: dominant side, lift foot 1 inch twice: Upper extremity support Pick Up Object From Behind: object at midline, hands breadth posterior: Verbal cues/increased time Forward Reach: use dominant arm, must complete full motion: Upper extremity support Lateral Reach: use dominant arm, clear opposite ischial tuberosity: Upper extremity support Pick Up Object From Floor: from between feet: Verbal cues/increased time Posterior Scooting: move backwards 2 inches: Upper extremity support Anterior Scooting: move forward 2 inches: Upper extremity support Lateral Scooting: move to dominent side 2 inches: Upper extremity support FIST TOTAL SCORE: 32 Static Sitting Balance Static Sitting - Balance Support: Feet supported;Bilateral upper extremity supported Static Sitting - Level of Assistance: 5: Stand by assistance Dynamic Sitting Balance Dynamic Sitting - Balance Support: Feet supported;During functional activity;Bilateral upper extremity supported Dynamic Sitting - Level of Assistance: 4: Min assist Dynamic Sitting - Balance Activities:  Lateral lean/weight shifting;Reaching for objects;Reaching for weighted objects;Reaching across midline;Forward lean/weight shifting Static Standing Balance Static Standing - Balance Support: Bilateral upper extremity supported Static Standing - Level of Assistance: 3: Mod assist Extremity Assessment  RUE Assessment RUE Assessment: Exceptions to [facility] General Strength Comments: 4-/5 overall, weaker than L LUE Assessment LUE Assessment: Within Functional Limits RLE Assessment RLE Assessment: Exceptions to [facility] General Strength Comments: 0/5 for MMT; functionally demonstrated grossly 3- to 3/5 during modified stance LLE Assessment LLE Assessment: Exceptions to [facility] General Strength Comments: hip grossly 3/5, knee  ext 4-/ 5, knee flex 3/5, ankle 3+/5  Care Tool Care Tool Bed Mobility Roll left and right activity   Roll left and right assist level: Moderate Assistance - Patient 50 - 74%    Sit to lying activity   Sit to lying assist level: Moderate Assistance - Patient 50 - 74%    Lying to sitting on side of bed activity   Lying to sitting on side of bed assist level: the ability to move from lying on the back to sitting on the side of the bed with no back support.: Moderate Assistance - Patient 50 - 74%     Care Tool Transfers Sit to stand transfer Sit to stand activity did not occur: Safety/medical concerns (unable to safely stand with 1 person assist) Sit to stand assist level: Moderate Assistance - Patient 50 - 74%    Chair/bed transfer Chair/bed transfer activity did not occur: Safety/medical concerns       Toilet transfer Toilet transfer activity did not occur: Safety/medical concerns Assist Level:  (unable to safely transfer with 1 person assist)    Scientist, product/process development transfer activity did not occur: Safety/medical concerns        Care Tool Locomotion Ambulation Ambulation activity did not occur: Safety/medical concerns        Walk 10 feet activity Walk 10 feet activity did not occur: Safety/medical concerns       Walk 50 feet with 2 turns activity Walk 50 feet with 2 turns activity did not occur: Safety/medical concerns      Walk 150 feet activity Walk 150 feet activity did not occur: Safety/medical concerns      Walk 10 feet on uneven surfaces activity Walk 10 feet on uneven surfaces activity did not occur: Safety/medical concerns      Stairs Stair activity did not occur: Safety/medical concerns        Walk up/down 1 step activity Walk up/down 1 step or curb (drop down) activity did not occur: Safety/medical concerns      Walk up/down 4 steps activity Walk up/down 4 steps activity did not occur: Safety/medical concerns      Walk up/down 12 steps activity Walk  up/down 12 steps activity did not occur: Safety/medical concerns      Pick up small objects from floor Pick up small object from the floor (from standing position) activity did not occur: Safety/medical concerns      Wheelchair Is the patient using a wheelchair?: Yes Type of Wheelchair: Manual Wheelchair activity did not occur: Safety/medical concerns      Wheel 50 feet with 2 turns activity Wheelchair 50 feet with 2 turns activity did not occur: Safety/medical concerns    Wheel 150 feet activity Wheelchair 150 feet activity did not occur: Safety/medical concerns      Refer to Care Plan for Long Term Goals  SHORT TERM GOAL WEEK 1 Week 1:  PT Short Term Goal 1 (Week 1): Pt  will perform overall bed mobility with MinA. PT Short Term Goal 2 (Week 1): Pt will perform sit<>stand transfers with overall MinA to LRAD. PT Short Term Goal 3 (Week 1): Pt will perform stand pivot transfers with mod A using LRAD. PT Short Term Goal 4 (Week 1): Pt will ambulate at least 20 feet with ModA using LRAD. PT Short Term Goal 5 (Week 1): Pt will propel w/c at least 100 ft with up to [place].  Recommendations for other services: Therapeutic Recreation  Kitchen group and Stress management  Skilled Therapeutic Intervention Mobility Bed Mobility Bed Mobility: Rolling Right;Rolling Left;Right Sidelying to Sit;Sit to Sidelying Right Rolling Right: Contact Guard/Touching assist Rolling Left: Moderate Assistance - Patient 50-74% Right Sidelying to Sit: Minimal Assistance - Patient > 75%;Moderate Assistance - Patient 50-74% Supine to Sit: Maximal Assistance - Patient - Patient 25-49% Sit to Sidelying Right: Moderate Assistance - Patient 50-74% (assist provied to BLE only) Transfers Transfers: Sit to Stand;Stand to Sit;Lateral/Scoot Transfers Sit to Stand: Moderate Assistance - Patient 50-74% Stand to Sit: Minimal Assistance - Patient > 75% Lateral/Scoot Transfers: Contact Guard/Touching assist Locomotion   Gait Ambulation: No Gait Gait: No Stairs / Additional Locomotion Stairs: No Wheelchair Mobility Wheelchair Mobility: No  Skilled Interventions: Patient supine in bed on entrance to room. Patient alert and agreeable to PT session.   Patient with no pain complaint throughout session.  Therapeutic Activity: Bed Mobility: Patient performed roll to L side with ability to turn UB and requires ModA for hips/ RLE. Roll to R side with CGA and no cues for technique. Sidelying -->sit with Min/ ModA for RLE and bringing UB to upright seated position on EOB. Pt provides good push with BUE. VC/ tc required for technique throughout. Seated balance improves during session without need for UE support for maintaining balance until LBP increases.  Transfers: Patient unwilling to use STEDY for sit<>stand transfer/ transfer to w/c d/t discomfort with use while on acute care. Pt informed that therapy staff will attempt to improve comfort for pt in afternoon session so that nursing staff can appropriately use STEDY to mobilize pt with improved comfort. Pt willing to attempt sit<>stand with one person for support assist although is apprehensive. Pt performed sit<>stand transfer from EOB  to modified quadriped using armchair and block to R knee throughout. Pt is able to demonstrate with palms flat on seat of armchair and demos activation of R quad group. Progressed to armrests with similar performance. Requires overall ModA to perform. Pt is able to demonstrate lateral scoot along EOB to foot of bed and back with physical assist to position RLE with BUE. Overall SUP. Provided min verbal cues for technique overall with all transfers.  Neuromuscular Re-ed: NMR facilitated during session with focus on seated balance. Pt guided in reaching activity for targets from floor to overhead height and outside of BOS. Able to withstand moderate perturbations to chest, back, and acromion processes. Also maintains upright position  with push/ pull with moderate pressure. FIST and PASS assessed at EOB. Scoring listed above. NMR performed for improvements in motor control and coordination, balance, sequencing, judgement, and self confidence/ efficacy in performing all aspects of mobility at highest level of independence.   Patient supine  in bed at end of session with brakes locked, bed alarm set, and all needs within reach.   Discharge Criteria: Patient will be discharged from PT if patient refuses treatment 3 consecutive times without medical reason, if treatment goals not met, if there is a change in  medical status, if patient makes no progress towards goals or if patient is discharged from hospital.  The above assessment, treatment plan, treatment alternatives and goals were discussed and mutually agreed upon: by patient  Alger Simons PT, DPT 04/05/2022, 1:57 PM

## 2022-04-05 NOTE — Plan of Care (Signed)
  Problem: RH Balance Goal: LTG: Patient will maintain dynamic sitting balance (OT) Description: LTG:  Patient will maintain dynamic sitting balance with assistance during activities of daily living (OT) Flowsheets (Taken 04/05/2022 1231) LTG: Pt will maintain dynamic sitting balance during ADLs with: Independent with assistive device Goal: LTG Patient will maintain dynamic standing with ADLs (OT) Description: LTG:  Patient will maintain dynamic standing balance with assist during activities of daily living (OT)  Flowsheets (Taken 04/05/2022 1231) LTG: Pt will maintain dynamic standing balance during ADLs with: Minimal Assistance - Patient > 75%   Problem: Sit to Stand Goal: LTG:  Patient will perform sit to stand in prep for activites of daily living with assistance level (OT) Description: LTG:  Patient will perform sit to stand in prep for activites of daily living with assistance level (OT) Flowsheets (Taken 04/05/2022 1231) LTG: PT will perform sit to stand in prep for activites of daily living with assistance level: Minimal Assistance - Patient > 75%   Problem: RH Grooming Goal: LTG Patient will perform grooming w/assist,cues/equip (OT) Description: LTG: Patient will perform grooming with assist, with/without cues using equipment (OT) Flowsheets (Taken 04/05/2022 1231) LTG: Pt will perform grooming with assistance level of: Independent with assistive device    Problem: RH Bathing Goal: LTG Patient will bathe all body parts with assist levels (OT) Description: LTG: Patient will bathe all body parts with assist levels (OT) Flowsheets (Taken 04/05/2022 1231) LTG: Pt will perform bathing with assistance level/cueing: Minimal Assistance - Patient > 75%   Problem: RH Dressing Goal: LTG Patient will perform upper body dressing (OT) Description: LTG Patient will perform upper body dressing with assist, with/without cues (OT). Flowsheets (Taken 04/05/2022 1231) LTG: Pt will perform upper body  dressing with assistance level of: Independent with assistive device Goal: LTG Patient will perform lower body dressing w/assist (OT) Description: LTG: Patient will perform lower body dressing with assist, with/without cues in positioning using equipment (OT) Flowsheets (Taken 04/05/2022 1231) LTG: Pt will perform lower body dressing with assistance level of: Minimal Assistance - Patient > 75%   Problem: RH Toileting Goal: LTG Patient will perform toileting task (3/3 steps) with assistance level (OT) Description: LTG: Patient will perform toileting task (3/3 steps) with assistance level (OT)  Flowsheets (Taken 04/05/2022 1231) LTG: Pt will perform toileting task (3/3 steps) with assistance level: Minimal Assistance - Patient > 75%   Problem: RH Functional Use of Upper Extremity Goal: LTG Patient will use RT/LT upper extremity as a (OT) Description: LTG: Patient will use right/left upper extremity as a stabilizer/gross assist/diminished/nondominant/dominant level with assist, with/without cues during functional activity (OT) Flowsheets (Taken 04/05/2022 1231) LTG: Use of upper extremity in functional activities: RUE as nondominant level LTG: Pt will use upper extremity in functional activity with assistance level of: Supervision/Verbal cueing   Problem: RH Toilet Transfers Goal: LTG Patient will perform toilet transfers w/assist (OT) Description: LTG: Patient will perform toilet transfers with assist, with/without cues using equipment (OT) Flowsheets (Taken 04/05/2022 1231) LTG: Pt will perform toilet transfers with assistance level of: Minimal Assistance - Patient > 75%   Problem: RH Tub/Shower Transfers Goal: LTG Patient will perform tub/shower transfers w/assist (OT) Description: LTG: Patient will perform tub/shower transfers with assist, with/without cues using equipment (OT) Flowsheets (Taken 04/05/2022 1231) LTG: Pt will perform tub/shower stall transfers with assistance level of:  Minimal Assistance - Patient > 75%

## 2022-04-05 NOTE — Plan of Care (Signed)
  Problem: RH Balance Goal: LTG Patient will maintain dynamic sitting balance (PT) Description: LTG:  Patient will maintain dynamic sitting balance with assistance during mobility activities (PT) Flowsheets (Taken 04/05/2022 1257) LTG: Pt will maintain dynamic sitting balance during mobility activities with:: Independent with assistive device  Goal: LTG Patient will maintain dynamic standing balance (PT) Description: LTG:  Patient will maintain dynamic standing balance with assistance during mobility activities (PT) Flowsheets (Taken 04/05/2022 1257) LTG: Pt will maintain dynamic standing balance during mobility activities with:: Contact Guard/Touching assist   Problem: Sit to Stand Goal: LTG:  Patient will perform sit to stand with assistance level (PT) Description: LTG:  Patient will perform sit to stand with assistance level (PT) Flowsheets (Taken 04/05/2022 1257) LTG: PT will perform sit to stand in preparation for functional mobility with assistance level: Supervision/Verbal cueing   Problem: RH Bed Mobility Goal: LTG Patient will perform bed mobility with assist (PT) Description: LTG: Patient will perform bed mobility with assistance, with/without cues (PT). Flowsheets (Taken 04/05/2022 1257) LTG: Pt will perform bed mobility with assistance level of: Contact Guard/Touching assist   Problem: RH Bed to Chair Transfers Goal: LTG Patient will perform bed/chair transfers w/assist (PT) Description: LTG: Patient will perform bed to chair transfers with assistance (PT). Flowsheets (Taken 04/05/2022 1257) LTG: Pt will perform Bed to Chair Transfers with assistance level: Contact Guard/Touching assist   Problem: RH Car Transfers Goal: LTG Patient will perform car transfers with assist (PT) Description: LTG: Patient will perform car transfers with assistance (PT). Flowsheets (Taken 04/05/2022 1257) LTG: Pt will perform car transfers with assist:: Contact Guard/Touching assist   Problem: RH  Furniture Transfers Goal: LTG Patient will perform furniture transfers w/assist (OT/PT) Description: LTG: Patient will perform furniture transfers  with assistance (OT/PT). Flowsheets (Taken 04/05/2022 1257) LTG: Pt will perform furniture transfers with assist:: Minimal Assistance - Patient > 75%   Problem: RH Ambulation Goal: LTG Patient will ambulate in controlled environment (PT) Description: LTG: Patient will ambulate in a controlled environment, # of feet with assistance (PT). Flowsheets (Taken 04/05/2022 1257) LTG: Pt will ambulate in controlled environ  assist needed:: Contact Guard/Touching assist LTG: Ambulation distance in controlled environment: 100 ft using LRAD Goal: LTG Patient will ambulate in home environment (PT) Description: LTG: Patient will ambulate in home environment, # of feet with assistance (PT). Flowsheets (Taken 04/05/2022 1257) LTG: Pt will ambulate in home environ  assist needed:: Contact Guard/Touching assist LTG: Ambulation distance in home environment: 50 ft using LRAD   Problem: RH Wheelchair Mobility Goal: LTG Patient will propel w/c in controlled environment (PT) Description: LTG: Patient will propel wheelchair in controlled environment, # of feet with assist (PT) Flowsheets (Taken 04/05/2022 1257) LTG: Pt will propel w/c in controlled environ  assist needed:: Supervision/Verbal cueing LTG: Propel w/c distance in controlled environment: 150 ft Goal: LTG Patient will propel w/c in home environment (PT) Description: LTG: Patient will propel wheelchair in home environment, # of feet with assistance (PT). Flowsheets (Taken 04/05/2022 1257) LTG: Pt will propel w/c in home environ  assist needed:: Supervision/Verbal cueing Distance: wheelchair distance in controlled environment: 150 LTG: Propel w/c distance in home environment: at least 50 ft

## 2022-04-05 NOTE — Progress Notes (Signed)
Physical Therapy Session Note  Patient Details  Name: Austin Oliver MRN: [id number] Date of Birth: [birth year]  Today's Date: 04/05/2022 PT Individual Time: 4259-5638 PT Individual Time Calculation (min): 74 min   Short Term Goals: Week 1:     Skilled Therapeutic Interventions/Progress Updates: Pt presented in bed with pastor and wife present agreeable to therapy. PTA discussed pt's CLOF and anticipated goals with evaluating therapist prior to meeting pt. Pt denies pain at rest but concerned that mobility will increase R knee pain as has occurred in past. Discussed expectation of CIR to improve mobility for greater independence. Pt performed supine to sit with minA rolling to R. Pt noted to place hands posteriorly initially for stabilization however after a moment was CGA. PTA discussed with pt attempt for squat pivot transfer with pt eventually amicable. Pt stated that brief felt "bunched up" therefore pt performed sit to supine with modA and was able to roll to L with modA as PTA re-adjusted brief. Pt returned to sitting with minA via same technique as prior. Pt was able to scoot towards w/c with CGA however when attempting to initiate scoot to w/c pt with posterior LOB which he was able to correct by placing arm behind him. Pt indicated that it happens intermittently therefore this therapist decided current safe practice was to use Slide board. Pt required minA for set up and was able to perform Slide board transfer with minA overall. Pt then shown around department and pt was able to propel from 4MW nsg station to 4W nsg station with supervision. Pt then transported to day room and set up at [employer]. Pt performed Cybex Kinetron at 90cm/sec 15 cycles x 2 for reciprocal activity and forced use of BLE. PTA had to assist RLE however intermittent trace activation of R hamstrings/glute noted as pt was occasionally able to push pedal slightly when weight taken off L plate. Pt then transported back to  room and performed Slide board transfer to bed with total A for set up and minA for transfer. Pt required modA for sit to supine for BLE management primarily. Pt indicating during transfer that he had what felt like a bladder spasm and unsure if passed gas or had a BM. Pt rolled to sidelying and PTA noted small BM but no urine. Once pt repositioned to comfort pt left with bed alarm on, call bell within reach and needs met. PTA notified nsg of pt's disposition and that pt felt like he had bladder spasm.    Therapy Documentation Precautions:  Precautions Precautions: Fall Precaution Comments: RLE hemiplegia, LLE hemipareisis, Restrictions Weight Bearing Restrictions: No General:   Vital Signs: Therapy Vitals Temp: 98.7 F (37.1 C) Pulse Rate: 67 Resp: 19 BP: (!) 102/57 Patient Position (if appropriate): Lying Oxygen Therapy SpO2: 98 % O2 Device: Room Air Pain: Pain Assessment Pain Scale: 0-10 Pain Score: 2  Pain Type: Chronic pain Pain Location: Back Pain Orientation: Lower Pain Descriptors / Indicators: Aching Pain Intervention(s): Repositioned Mobility: Bed Mobility Bed Mobility: Rolling Right;Rolling Left;Right Sidelying to Sit;Sit to Sidelying Right Rolling Right: Contact Guard/Touching assist Rolling Left: Moderate Assistance - Patient 50-74% Right Sidelying to Sit: Minimal Assistance - Patient > 75%;Moderate Assistance - Patient 50-74% Supine to Sit: Maximal Assistance - Patient - Patient 25-49% Sit to Sidelying Right: Moderate Assistance - Patient 50-74% (assist provied to BLE only) Transfers Transfers: Sit to Stand;Stand to Sit;Lateral/Scoot Transfers Sit to Stand: Moderate Assistance - Patient 50-74% Stand to Sit: Minimal Assistance - Patient > 75%  Lateral/Scoot Transfers: Contact Guard/Touching assist Locomotion : Gait Ambulation: No Gait Gait: No Stairs / Additional Locomotion Stairs: No Wheelchair Mobility Wheelchair Mobility: No  Trunk/Postural  Assessment : Cervical Assessment Cervical Assessment: Exceptions to [facility] (slight forward head) Thoracic Assessment Thoracic Assessment: Exceptions to [facility] (rounded shoulders) Lumbar Assessment Lumbar Assessment: Exceptions to [facility] (posterior pelvic tilt in sitting) Postural Control Postural Control: Within Functional Limits (relates inability to maintain balance, however demos no LOB and is able to resist mod perturbations)  Balance: Balance Balance Assessed: Yes Standardized Balance Assessment Standardized Balance Assessment: FIST;PASS Postural Assessment Scale for Stroke Patients=PASS 1. Sitting Without Support: Can sit for 5 minutes without support 2. Standing With Support: Can stand with moderate support of 1 person 3. Standing Without Support: Cannot stand without support 4.Standing on Nonparetic Leg: Cannot stand on nonparetic leg 5.Standing on Paretic Leg: Cannot stand on paretic leg MAINTAINING POSTURE SUBTOTAL: 5 6. Supine to Paretic Side Lateral: Can perform without help 7. Supine to Nonparetic Side Lateral: Can perform with much help 8. Supine to Sitting Up on the Edge of the Mat: Can perform with little help 9. Sitting on the Edge of the Mat to Supine: Can perform with much help 10. Sitting to Standing Up: Can perform with much help 11. Standing Up to Sitting Down: Can perform with much help 12. Standing,Picking Up a Pencil from the Floor: Cannot perform CHANGING POSTURE SUBTOTAL: 9 PASS TOTAL SCORE: 14 Function in Sitting Test = FIST Anterior Nudge: superior sternum: Verbal cues/increased time Posterior Nudge: between scapular spines: Verbal cues/increased time Lateral Nudge: to dominant side at acromion: Upper extremity support Static Sitting: 30 seconds: Verbal cues/increased time Sitting, Shake "No": left and right: Upper extremity support Sitting, Eyes Closed: 30 seconds: Needs assistance Sitting, Eyes Closed Assistance: Min Assist Sitting, Lift Foot: dominant  side, lift foot 1 inch twice: Upper extremity support Pick Up Object From Behind: object at midline, hands breadth posterior: Verbal cues/increased time Forward Reach: use dominant arm, must complete full motion: Upper extremity support Lateral Reach: use dominant arm, clear opposite ischial tuberosity: Upper extremity support Pick Up Object From Floor: from between feet: Verbal cues/increased time Posterior Scooting: move backwards 2 inches: Upper extremity support Anterior Scooting: move forward 2 inches: Upper extremity support Lateral Scooting: move to dominent side 2 inches: Upper extremity support FIST TOTAL SCORE: 32 Static Sitting Balance Static Sitting - Balance Support: Feet supported;Bilateral upper extremity supported Static Sitting - Level of Assistance: 5: Stand by assistance Dynamic Sitting Balance Dynamic Sitting - Balance Support: Feet supported;During functional activity;Bilateral upper extremity supported Dynamic Sitting - Level of Assistance: 4: Min assist Dynamic Sitting - Balance Activities: Lateral lean/weight shifting;Reaching for objects;Reaching for weighted objects;Reaching across midline;Forward lean/weight shifting Static Standing Balance Static Standing - Balance Support: Bilateral upper extremity supported Static Standing - Level of Assistance: 3: Mod assist Exercises:   Other Treatments:      Therapy/Group: Individual Therapy  Dezerae Freiberger 04/05/2022, 4:00 PM

## 2022-04-05 NOTE — Progress Notes (Signed)
PROGRESS NOTE   Subjective/Complaints: No new complaints this morning Foley has been removed and he has not yet voided. Discussed increasing flomax and he is agreeable  ROS: +urinary retention   Objective:   DG Abd Portable 1V  Result Date: 04/04/2022 CLINICAL DATA:  Constipation, initial encounter EXAM: PORTABLE ABDOMEN - 1 VIEW COMPARISON:  12/30/2021 FINDINGS: Previously administered contrast now lies within the left colon. Retained fecal material is seen without obstructive change. No free air is noted. No acute bony abnormality is noted. IMPRESSION: Changes consistent with colonic constipation. Previously administered contrast remains within the colon 6 days later. Electronically Signed   By: Inez Catalina M.D.   On: 04/04/2022 19:04   Recent Labs    04/04/22 0250 04/05/22 0537  WBC 3.3* 3.4*  HGB 10.3* 10.2*  HCT 30.9* 31.3*  PLT 115* 127*   Recent Labs    04/04/22 0250 04/05/22 0537  NA 136 135  K 4.1 5.2*  CL 100 97*  CO2 23 27  GLUCOSE 98 108*  BUN 24* 22  CREATININE 1.11 1.29*  CALCIUM 8.7* 9.2    Intake/Output Summary (Last 24 hours) at 04/05/2022 1504 Last data filed at 04/05/2022 1451 Gross per 24 hour  Intake 720 ml  Output 2000 ml  Net -1280 ml        Physical Exam: Vital Signs Blood pressure 102/65, pulse 76, temperature 98.7 F (37.1 C), temperature source Oral, resp. rate 18, height '5\' 9"'$  (1.753 m), weight 92.1 kg, SpO2 95 %. Gen: no distress, normal appearing HEENT: oral mucosa pink and moist, NCAT Cardio: Reg rate Chest: normal effort, normal rate of breathing Abd: soft, non-distended Ext: no edema Psych: pleasant, normal affect Skin: intact Neurological:     Mental Status: He is alert and oriented to person, place, and time.     Comments: Speech clear. Able to answer orientation questions and follow simple commands without difficulty. Unable to lift right lower extremity, LLE 4/5,  b/l UE 4/5 with the exception of EE 3/5  Extremities: trace pedal edema present bilaterally.   Assessment/Plan: 1. Functional deficits which require 3+ hours per day of interdisciplinary therapy in a comprehensive inpatient rehab setting. Physiatrist is providing close team supervision and 24 hour management of active medical problems listed below. Physiatrist and rehab team continue to assess barriers to discharge/monitor patient progress toward functional and medical goals  Care Tool:  Bathing    Body parts bathed by patient: Right arm, Left arm, Chest, Abdomen, Face   Body parts bathed by helper: Front perineal area, Buttocks, Right upper leg, Left upper leg, Right lower leg, Left lower leg     Bathing assist Assist Level: Maximal Assistance - Patient 24 - 49%     Upper Body Dressing/Undressing Upper body dressing   What is the patient wearing?: Pull over shirt    Upper body assist Assist Level: Minimal Assistance - Patient > 75%    Lower Body Dressing/Undressing Lower body dressing      What is the patient wearing?: Pants     Lower body assist Assist for lower body dressing: Total Assistance - Patient < 25%     Toileting Toileting  Toileting assist Assist for toileting: Dependent - Patient 0%     Transfers Chair/bed transfer  Transfers assist  Chair/bed transfer activity did not occur: Safety/medical concerns        Locomotion Ambulation   Ambulation assist   Ambulation activity did not occur: Safety/medical concerns          Walk 10 feet activity   Assist  Walk 10 feet activity did not occur: Safety/medical concerns        Walk 50 feet activity   Assist Walk 50 feet with 2 turns activity did not occur: Safety/medical concerns         Walk 150 feet activity   Assist Walk 150 feet activity did not occur: Safety/medical concerns         Walk 10 feet on uneven surface  activity   Assist Walk 10 feet on uneven surfaces  activity did not occur: Safety/medical concerns         Wheelchair     Assist Is the patient using a wheelchair?: Yes Type of Wheelchair: Manual Wheelchair activity did not occur: Safety/medical concerns         Wheelchair 50 feet with 2 turns activity    Assist    Wheelchair 50 feet with 2 turns activity did not occur: Safety/medical concerns       Wheelchair 150 feet activity     Assist  Wheelchair 150 feet activity did not occur: Safety/medical concerns       Blood pressure 102/65, pulse 76, temperature 98.7 F (37.1 C), temperature source Oral, resp. rate 18, height '5\' 9"'$  (1.753 m), weight 92.1 kg, SpO2 95 %.  Medical Problem List and Plan: 1. 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  Antithrombotics: -DVT/anticoagulation:  Pharmaceutical: Lovenox             -antiplatelet therapy: DAPT on hold for procedure 3. Pain Management:  Tylenol prn.  4. Mood: LCSW to follow for evaluation and support.              -antipsychotic agents: N/A 5. Neuropsych: This patient is capable of making decisions on her own behalf. 6. Skin/Wound Care: Routine pressure relief measures.  7. Fluids/Electrolytes/Nutrition: Encourage fluid intake. Recheck CMET in am.  8. CAD/SSS s/p ICD/PPM: On Zetia and Ranexa--ASA/Plavix on hold for procedure 9. T2DM: Hgb A1c has dropped from 6.8-->5.4 since 11/19/21.             --has been off metformin since last admission.  --continue to  monitor BS ac/hs and [place] SSI for elevated BS.   10: Anemia of chronic disease: Recheck CBC in am.  11. Thrombocytopenia: Has been 121 since 04/30-->115. --off ASA/Plavix --monitor for signs of bleeding.  12. Pre-renal azotemia: Encourage fluid intake.  57. Concerns of metastatic disease: Liver biopsy 04/07/22 for work up 14. Urinary retention: Foley d/ced. Continue Flomax/Proscar.  Increase Flomax to 0.'8mg'$  15. COPD: Respiratory status stable. Will change Yupelri to MDI per patient request.  16. Right knee buckling: discussed importance of quadricpes strengthening 17. Numbness and tingling of lower extremities: discussed Qutenza as an option outpatient.  18. Post TKA right knee pain: discussed that Qutenza can be tried on knee as well outpatient.  19. Hyperkalemia:  given kayexalate today 20. Constipation: give kayexalate today   LOS: 1 days A FACE TO FACE EVALUATION WAS PERFORMED  Martha Clan P Marlet Korte 04/05/2022, 3:04 PM

## 2022-04-06 ENCOUNTER — Inpatient Hospital Stay (HOSPITAL_COMMUNITY): Payer: Medicare Other

## 2022-04-06 LAB — BASIC METABOLIC PANEL
Anion gap: 12 (ref 5–15)
BUN: 23 mg/dL (ref 8–23)
CO2: 25 mmol/L (ref 22–32)
Calcium: 8.5 mg/dL — ABNORMAL LOW (ref 8.9–10.3)
Chloride: 96 mmol/L — ABNORMAL LOW (ref 98–111)
Creatinine, Ser: 1.21 mg/dL (ref 0.61–1.24)
GFR, Estimated: >60 mL/min (ref >60)
Glucose, Bld: 111 mg/dL — ABNORMAL HIGH (ref 70–99)
Potassium: 4 mmol/L (ref 3.5–5.1)
Sodium: 133 mmol/L — ABNORMAL LOW (ref 135–145)

## 2022-04-06 LAB — GLUCOSE, CAPILLARY
Glucose-Capillary: 100 mg/dL — ABNORMAL HIGH (ref 70–99)
Glucose-Capillary: 113 mg/dL — ABNORMAL HIGH (ref 70–99)
Glucose-Capillary: 100 mg/dL — ABNORMAL HIGH (ref 70–99)
Glucose-Capillary: 126 mg/dL — ABNORMAL HIGH (ref 70–99)

## 2022-04-06 MED ORDER — MAGNESIUM HYDROXIDE 400 MG/5ML PO SUSP
30.0000 mL | Freq: Once | ORAL | Status: AC
  Administered 2022-04-06: 30 mL via ORAL
  Filled 2022-04-06: qty 30

## 2022-04-06 MED ORDER — BETHANECHOL CHLORIDE 10 MG PO TABS
5.0000 mg | ORAL_TABLET | Freq: Three times a day (TID) | ORAL | Status: DC
  Administered 2022-04-06 – 2022-04-13 (×20): 5 mg via ORAL
  Filled 2022-04-06 (×19): qty 1

## 2022-04-06 NOTE — Progress Notes (Signed)
PROGRESS NOTE   Subjective/Complaints: No new complaints this morning  Feeing pretty well today  Still not voiding- requiring I/O- nont painful for him.  ROS: +urinary retention, right knee pain, +abdominal pain   Objective:   DG Abd Portable 1V  Result Date: 04/04/2022 CLINICAL DATA:  Constipation, initial encounter EXAM: PORTABLE ABDOMEN - 1 VIEW COMPARISON:  12/30/2021 FINDINGS: Previously administered contrast now lies within the left colon. Retained fecal material is seen without obstructive change. No free air is noted. No acute bony abnormality is noted. IMPRESSION: Changes consistent with colonic constipation. Previously administered contrast remains within the colon 6 days later. Electronically Signed   By: Inez Catalina M.D.   On: 04/04/2022 19:04    Recent Labs    04/04/22 0250 04/05/22 0537  WBC 3.3* 3.4*  HGB 10.3* 10.2*  HCT 30.9* 31.3*  PLT 115* 127*   Recent Labs    04/04/22 0250 04/05/22 0537  NA 136 135  K 4.1 5.2*  CL 100 97*  CO2 23 27  GLUCOSE 98 108*  BUN 24* 22  CREATININE 1.11 1.29*  CALCIUM 8.7* 9.2    Intake/Output Summary (Last 24 hours) at 04/06/2022 1053 Last data filed at 04/06/2022 0744 Gross per 24 hour  Intake 900 ml  Output 1200 ml  Net -300 ml        Physical Exam: Vital Signs Blood pressure 116/72, pulse 77, temperature 98.7 F (37.1 C), temperature source Oral, resp. rate 18, height '5\' 9"'$  (1.753 m), weight 92.1 kg, SpO2 94 %. Gen: no distress, normal appearing HEENT: oral mucosa pink and moist, NCAT Cardio: Reg rate Chest: normal effort, normal rate of breathing Abd: soft, non-distended Ext: no edema Psych: pleasant, normal affect Skin: intact Neurological:     Mental Status: He is alert and oriented to person, place, and time.     Comments: Speech clear. Able to answer orientation questions and follow simple commands without difficulty. Unable to lift right lower  extremity, LLE 4/5, b/l UE 4/5 with the exception of EE 3/5  Extremities: trace pedal edema present bilaterally.   Assessment/Plan: 1. Functional deficits which require 3+ hours per day of interdisciplinary therapy in a comprehensive inpatient rehab setting. Physiatrist is providing close team supervision and 24 hour management of active medical problems listed below. Physiatrist and rehab team continue to assess barriers to discharge/monitor patient progress toward functional and medical goals  Care Tool:  Bathing    Body parts bathed by patient: Right arm, Left arm, Chest, Abdomen, Face   Body parts bathed by helper: Front perineal area, Buttocks, Right upper leg, Left upper leg, Right lower leg, Left lower leg     Bathing assist Assist Level: Maximal Assistance - Patient 24 - 49%     Upper Body Dressing/Undressing Upper body dressing   What is the patient wearing?: Pull over shirt    Upper body assist Assist Level: Minimal Assistance - Patient > 75%    Lower Body Dressing/Undressing Lower body dressing      What is the patient wearing?: Pants     Lower body assist Assist for lower body dressing: Total Assistance - Patient < 25%  Toileting Toileting    Toileting assist Assist for toileting: Dependent - Patient 0%     Transfers Chair/bed transfer  Transfers assist  Chair/bed transfer activity did not occur: Safety/medical concerns        Locomotion Ambulation   Ambulation assist   Ambulation activity did not occur: Safety/medical concerns          Walk 10 feet activity   Assist  Walk 10 feet activity did not occur: Safety/medical concerns        Walk 50 feet activity   Assist Walk 50 feet with 2 turns activity did not occur: Safety/medical concerns         Walk 150 feet activity   Assist Walk 150 feet activity did not occur: Safety/medical concerns         Walk 10 feet on uneven surface  activity   Assist Walk 10 feet  on uneven surfaces activity did not occur: Safety/medical concerns         Wheelchair     Assist Is the patient using a wheelchair?: Yes Type of Wheelchair: Manual Wheelchair activity did not occur: Safety/medical concerns         Wheelchair 50 feet with 2 turns activity    Assist    Wheelchair 50 feet with 2 turns activity did not occur: Safety/medical concerns       Wheelchair 150 feet activity     Assist  Wheelchair 150 feet activity did not occur: Safety/medical concerns       Blood pressure 116/72, pulse 77, temperature 98.7 F (37.1 C), temperature source Oral, resp. rate 18, height '5\' 9"'$  (1.753 m), weight 92.1 kg, SpO2 94 %.  Medical Problem List and Plan: 1. 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  Impaired mobility: continue Lovenox             -antiplatelet therapy: DAPT on hold for procedure 3. Pain Management:  Tylenol prn.  4. Mood: LCSW to follow for evaluation and support.              -antipsychotic agents: N/A 5. Neuropsych: This patient is capable of making decisions on her own behalf. 6. Skin/Wound Care: Routine pressure relief measures.  7. Fluids/Electrolytes/Nutrition: Encourage fluid intake. Recheck CMET in am.  8. CAD/SSS s/p ICD/PPM: On Zetia and Ranexa--ASA/Plavix on hold for procedure 9. T2DM: Hgb A1c has dropped from 6.8-->5.4 since 11/19/21.             --has been off metformin since last admission.  --continue to  monitor BS ac/hs and [place] SSI for elevated BS.   10: Anemia of chronic disease: Recheck CBC in am.  11. Thrombocytopenia: Has been 121 since 04/30-->115. --off ASA/Plavix --monitor for signs of bleeding.  12. Pre-renal azotemia: Encourage fluid intake.  62. Concerns of metastatic disease: Liver biopsy 04/07/22 for work up 14. Urinary retention: Foley d/ced. Continue Flomax/Proscar. Increase  Flomax to 0.'8mg'$ . Add bethenacol '5mg'$  TID 15. COPD: Respiratory status stable. Will change Yupelri to MDI per patient request.  16. Right knee buckling: discussed importance of quadricpes strengthening 17. Numbness and tingling of lower extremities: discussed Qutenza as an option outpatient.  18. Post TKA right knee pain: discussed that Qutenza can be tried on  knee as well outpatient.  19. Hyperkalemia: given kayexalate today 20. Constipation: give kayexalate today 21. Abdominal pain: still constipation on KUB today despite BM today- will give milk of mag to help clear out.    LOS: 2 days A FACE TO FACE EVALUATION WAS PERFORMED  Clide Deutscher Corinna Burkman 04/06/2022, 10:53 AM

## 2022-04-06 NOTE — Progress Notes (Signed)
Physical Therapy Session Note  Patient Details  Name: Austin Oliver MRN: [id number] Date of Birth: 03-22-1956  Today's Date: 04/06/2022 PT Individual Time: 1300-1408 PT Individual Time Calculation (min): 68 min   Short Term Goals: Week 1:     Skilled Therapeutic Interventions/Progress Updates:    Pt received supine in bed asleep, arousable and agreeable to PT session. Pt reports minimal pain at rest, has onset of bladder spasms/pain with mobility. Pt able to receive pain medication during session. Supine to sit with mod A needed for LLE management and some trunk elevation. Pt initially requires CGA for sitting balance due to posterior lean, with utilization of BUE support progresses to close Supervision. Slide board transfers bed to/from w/c with min A during session with cues for head/hips relationship during transfer. Manual w/c propulsion x 100 ft with use of BUE at Supervision level with increased time needed to complete. Transition to mat table via SB transfer. Seated balance EOM performing L/R lateral leans x 10 reps each direction at Supervision level. A/P leans with therapist supporting posteriorly x 10 reps with min A needed to prevent LOB posteriorly. Pt very fearful of posterior LOB and needs encouragement to perform leans this direction. Pt requests to return to bed at end of session, requesting brief be assessed for incontinence. Pt found to be incontinent of bowel in brief. Rolling L/R with mod A and use of bedrails for dependent brief change and pericare. Pt left seated in bed with needs in reach, bed alarm in place.  Therapy Documentation Precautions:  Precautions Precautions: Fall Precaution Comments: RLE hemiplegia, LLE hemipareisis, Restrictions Weight Bearing Restrictions: No      Therapy/Group: Individual Therapy   Excell Seltzer, PT, DPT, CSRS 04/06/2022, 4:07 PM

## 2022-04-07 ENCOUNTER — Inpatient Hospital Stay (HOSPITAL_COMMUNITY): Payer: Medicare Other

## 2022-04-07 HISTORY — PX: IR US GUIDE BX ASP/DRAIN: IMG2392

## 2022-04-07 LAB — CBC
HCT: 28.5 % — ABNORMAL LOW (ref 39.0–52.0)
Hemoglobin: 9.6 g/dL — ABNORMAL LOW (ref 13.0–17.0)
MCH: 28.9 pg (ref 26.0–34.0)
MCHC: 33.7 g/dL (ref 30.0–36.0)
MCV: 85.8 fL (ref 80.0–100.0)
Platelets: 126 10*3/uL — ABNORMAL LOW (ref 150–400)
RBC: 3.32 MIL/uL — ABNORMAL LOW (ref 4.22–5.81)
RDW: 16.5 % — ABNORMAL HIGH (ref 11.5–15.5)
WBC: 3.5 10*3/uL — ABNORMAL LOW (ref 4.0–10.5)
nRBC: 0 % (ref 0.0–0.2)

## 2022-04-07 LAB — GLUCOSE, CAPILLARY
Glucose-Capillary: 115 mg/dL — ABNORMAL HIGH (ref 70–99)
Glucose-Capillary: 108 mg/dL — ABNORMAL HIGH (ref 70–99)
Glucose-Capillary: 125 mg/dL — ABNORMAL HIGH (ref 70–99)
Glucose-Capillary: 153 mg/dL — ABNORMAL HIGH (ref 70–99)
Glucose-Capillary: 145 mg/dL — ABNORMAL HIGH (ref 70–99)

## 2022-04-07 LAB — BASIC METABOLIC PANEL
Anion gap: 12 (ref 5–15)
BUN: 23 mg/dL (ref 8–23)
CO2: 27 mmol/L (ref 22–32)
Calcium: 8.4 mg/dL — ABNORMAL LOW (ref 8.9–10.3)
Chloride: 96 mmol/L — ABNORMAL LOW (ref 98–111)
Creatinine, Ser: 1.31 mg/dL — ABNORMAL HIGH (ref 0.61–1.24)
GFR, Estimated: 60 mL/min — ABNORMAL LOW (ref >60)
Glucose, Bld: 119 mg/dL — ABNORMAL HIGH (ref 70–99)
Potassium: 4 mmol/L (ref 3.5–5.1)
Sodium: 135 mmol/L (ref 135–145)

## 2022-04-07 MED ORDER — GELATIN ABSORBABLE 12-7 MM EX MISC
CUTANEOUS | Status: AC
  Filled 2022-04-07: qty 1

## 2022-04-07 MED ORDER — MIDAZOLAM HCL 2 MG/2ML IJ SOLN
INTRAMUSCULAR | Status: AC
  Filled 2022-04-07: qty 4

## 2022-04-07 MED ORDER — FENTANYL CITRATE (PF) 100 MCG/2ML IJ SOLN
INTRAMUSCULAR | Status: AC
Start: 2022-04-07 — End: 2022-04-07
  Filled 2022-04-07: qty 4

## 2022-04-07 MED ORDER — LIDOCAINE HCL 1 % IJ SOLN
INTRAMUSCULAR | Status: AC
  Filled 2022-04-07: qty 20

## 2022-04-07 MED ORDER — FENTANYL CITRATE (PF) 100 MCG/2ML IJ SOLN
INTRAMUSCULAR | Status: AC | PRN
  Administered 2022-04-07 (×2): 25 ug via INTRAVENOUS

## 2022-04-07 MED ORDER — MIDAZOLAM HCL 2 MG/2ML IJ SOLN
INTRAMUSCULAR | Status: AC | PRN
  Administered 2022-04-07 (×2): .5 mg via INTRAVENOUS

## 2022-04-07 NOTE — Progress Notes (Signed)
Inpatient Rehabilitation  Patient information reviewed and entered into eRehab system by Ezrah Dembeck M. Yacine Droz, M.A., CCC/SLP, PPS Coordinator.  Information including medical coding, functional ability and quality indicators will be reviewed and updated through discharge.    

## 2022-04-07 NOTE — Progress Notes (Signed)
Pt arrived back from IR via bed. Pt is A/Ox4 no complaints of any pain at this time. VS completed, pt in bed resting.  Dayna Ramus

## 2022-04-07 NOTE — Progress Notes (Signed)
Occupational Therapy Session Note  Patient Details  Name: Austin Oliver MRN: [id number] Date of Birth: 28-Jan-1956  Today's Date: 04/07/2022 OT Individual Time: 7262-0355 OT Individual Time Calculation (min): 70 min    Short Term Goals: Week 1:  OT Short Term Goal 1 (Week 1): Patient will complete toilet transfer with max A of 1 OT Short Term Goal 2 (Week 1): Patient will thread one LE into pant leg using adaptive equipment OT Short Term Goal 3 (Week 1): Patient will maintain dynamic balance at EOB with no more than CGA  Skilled Therapeutic Interventions/Progress Updates:    Pt resting in bed upon arrival and agreeable to getting OOB. Pt has procedure scheduled later and nursing staff requested that pt return to bed at end of session. LB dressing at bed level with max A. Rolling R/L in bed with min A using bed rails. Supine>sit EOB with max A. Sitting balance EOB with close supervision. UB bathing/dressing seated EOB with supervision. SB transfer to w/c with CGA after placement of SB. Pt completed grooming tasks seated in w/c at sink. W/c propulsion/mobility with supervision and more then a reasonable amount of time. Pt has w/c at home that he was also using when his "legs felt weak." Pt dependent for transition to ADL apartment for time mgmt purposes. Pt sleeps in std twin bed at home. Discharge planning ongoing. Pt has std BSC but will need DABSC at discharge. Pt propelled w/c back to room. SB transfer to bed with min A (LOB x 1 with min A to correct). Sit>supine with max A. Pt remained in bed with bed alarm activated and all needs within reach.   Therapy Documentation Precautions:  Precautions Precautions: Fall Precaution Comments: RLE hemiplegia, LLE hemipareisis, Restrictions Weight Bearing Restrictions: No   Pain:  Pt reports lower back discomfort from "being in bed too long."; repositioned   Therapy/Group: Individual Therapy  Leroy Libman 04/07/2022, 9:30 AM

## 2022-04-07 NOTE — Procedures (Signed)
Interventional Radiology Procedure Note  Procedure: US guided left liver lesion biopsy  Indication: Multiple liver lesions  Findings: Please refer to procedural dictation for full description.  Complications: None  EBL: < 10 mL  Miachel Roux, MD [phone]

## 2022-04-07 NOTE — Progress Notes (Signed)
Patient ID: Austin Oliver, male   DOB: Nov 12, 1956, 66 y.o.   MRN: [id number]  SW went to pt room to complete assessment but pt not in room. SW met with pt wife to introduce self, explain role, ELOS, and discuss discharge process.  Pt wife is primary caregiver. States PRN support from their sons, however, primarily her. A lot of family loss this year, and she is adjusting as best as possible (lost her father and uncle). Reports she has had some health issues as well but doing well now. SW informed will f/u after team conference.   Loralee Pacas, MSW, [place] Office: [phone] Cell: [phone] Fax: [phone]

## 2022-04-07 NOTE — IPOC Note (Signed)
Overall Plan of Care [facility]) Patient Details Name: Austin Oliver MRN: [id number] DOB: Apr 27, 1956  Admitting Diagnosis: Spinal cord infarction [facility])  Hospital Problems: Principal Problem:   Spinal cord infarction [facility])     Functional Problem List: Nursing Bladder, Bowel, Edema, Endurance, Medication Management, Motor, Pain, Safety, Sensory, Perception, Skin Integrity  PT Balance, Endurance, Motor, Nutrition, Pain, Safety, Sensory, Skin Integrity  OT Balance, Edema, Motor, Endurance, Pain, Perception, Safety, Sensory, Skin Integrity  SLP    TR         Basic ADL's: OT Grooming, Bathing, Dressing, Toileting     Advanced  ADL's: OT       Transfers: PT Bed Mobility, Bed to Chair, Car, Manufacturing systems engineer, Metallurgist: PT Ambulation, Emergency planning/management officer, Stairs     Additional Impairments: OT Fuctional Use of Upper Extremity  SLP        TR      Anticipated Outcomes Item Anticipated Outcome  Self Feeding    Swallowing      Basic self-care  Min A  Toileting  Min A   Bathroom Transfers Min A  Bowel/Bladder  mod assist  Transfers  CGA  Locomotion  CGA  Communication     Cognition     Pain  < 3  Safety/Judgment  mod assist   Therapy Plan: PT Intensity: Minimum of 1-2 x/day ,45 to 90 minutes PT Frequency: 5 out of 7 days PT Duration Estimated Length of Stay: 3-4 weeks OT Intensity: Minimum of 1-2 x/day, 45 to 90 minutes OT Frequency: 5 out of 7 days OT Duration/Estimated Length of Stay: 24-28 days     Team Interventions: Nursing Interventions Patient/Family Education, Bladder Management, Bowel Management, Disease Management/Prevention, Pain Management, Medication Management, Skin Care/Wound Management, Discharge Planning  PT interventions Ambulation/gait training, Community reintegration, DME/adaptive equipment instruction, Neuromuscular re-education, Psychosocial support, Stair training, UE/LE Strength taining/ROM, Wheelchair  propulsion/positioning, UE/LE Coordination activities, Therapeutic Activities, Skin care/wound management, Pain management, Functional electrical stimulation, Discharge planning, Training and development officer, Cognitive remediation/compensation, Disease management/prevention, Patient/family education, Splinting/orthotics, Therapeutic Exercise, Visual/perceptual remediation/compensation, Functional mobility training  OT Interventions Balance/vestibular training, Cognitive remediation/compensation, Community reintegration, Discharge planning, Disease mangement/prevention, DME/adaptive equipment instruction, Functional electrical stimulation, Functional mobility training, Neuromuscular re-education, Pain management, Patient/family education, Psychosocial support, Self Care/advanced ADL retraining, Skin care/wound managment, Splinting/orthotics, Therapeutic Activities, Therapeutic Exercise, UE/LE Strength taining/ROM, UE/LE Coordination activities, Visual/perceptual remediation/compensation, Wheelchair propulsion/positioning  SLP Interventions    TR Interventions    SW/CM Interventions Discharge Planning, Psychosocial Support, Patient/Family Education   Barriers to Discharge MD  Medical stability  Nursing Decreased caregiver support, Home environment access/layout, Incontinence, Neurogenic Bowel & Bladder, Wound Care, Lack of/limited family support, Weight, Weight bearing restrictions Multi-level, ramped entrance. Bed/bath main level. Lives with spouse, can physically assist at discharge.  PT Inaccessible home environment, Decreased caregiver support, Home environment access/layout, Insurance for SNF coverage, Lack of/limited family support, Nutrition means, Incontinence, Neurogenic Bowel & Bladder    OT Decreased caregiver support spouse with her own medical issues and can not provide a lot of physical assist  SLP      SW       Team Discharge Planning: Destination: PT-Home ,OT- Home , SLP-  Projected  Follow-up: PT-Home health PT, OT-  Home health OT, SLP-  Projected Equipment Needs: PT-To be determined, OT- To be determined, SLP-  Equipment Details: PT- , OT-  Patient/family involved in discharge planning: PT- Patient,  OT-Patient, SLP-   MD ELOS: 3-4 weeks Medical Rehab Prognosis:  Excellent  Assessment: The patient has been admitted for CIR therapies with the diagnosis of spinal cord infarct. The team will be addressing functional mobility, strength, stamina, balance, safety, adaptive techniques and equipment, self-care, bowel and bladder mgt, patient and caregiver education, NMR, pain control, community reentry ego support. Goals have been set at min assist with self-care and contact guard assist with mobility. Anticipated discharge destination is home.        See Team Conference Notes for weekly updates to the plan of care

## 2022-04-07 NOTE — Progress Notes (Signed)
Physical Therapy Session Note  Patient Details  Name: Austin Oliver MRN: [id number] Date of Birth: 03-19-1956  Today's Date: 04/07/2022 PT Individual Time: 1000-1100; 1415-1535 PT Individual Time Calculation (min): 60 min and 80 min  Short Term Goals: Week 1:  PT Short Term Goal 1 (Week 1): Pt will perform overall bed mobility with MinA. PT Short Term Goal 2 (Week 1): Pt will perform sit<>stand transfers with overall MinA to LRAD. PT Short Term Goal 3 (Week 1): Pt will perform stand pivot transfers with mod A using LRAD. PT Short Term Goal 4 (Week 1): Pt will ambulate at least 20 feet with ModA using LRAD. PT Short Term Goal 5 (Week 1): Pt will propel w/c at least 100 ft with up to [place].  Skilled Therapeutic Interventions/Progress Updates:    Session 1: Pt received seated in bed, agreeable to PT session. No complaints of pain during session. Supine to sit with mod A needed for RLE management and some trunk control. Slide board transfers to the L and R bed to/from w/c with min A during session. Obtained shorter SB for improved ability to place and remove board more independently. Sit to stand x 5 reps to stedy from elevated mat table with CGA. Use of mirror in standing for visual feedback due to pt perception that he is leaning to the L. Pt with heavy UE reliance in standing with shoulder elevation, cues for upright trunk. Pt only able to maintain standing x 15 sec at most, unable to relax shoulders and decrease shoulder elevation. Sit to stand x 5 reps to chair in front of patient with BUE on armrests of chair, improved activation of quads noted for standing L>R. Also educated pt on bowel and bladder control following SCI and importance of bowel program and learning self-cathing techniques for bladder management. Pt reports he will not be performing I/O caths on himself upon d/c home, encouraged him to continue education and discussion regarding bladder management with team. Pt left seated in w/c  in room with needs in reach at end of session.  Session 2: Pt received seated in bed with wife present in room, agreeable to PT session. Pt reports some soreness in his L shoulder following biopsy and some cramping in abdominal region, declines intervention. Supine to sit with min A for RLE management, increased time needed for trunk elevation. Slide board transfers bed to/from w/c with min to mod A during session. Pt exhibits increase in fatigue this PM with increased assist needed for transfers. Pt educated on head/hips relationship during SB transfers with fair adherence. Sit to stand in // bars x 3 reps with R knee blocked with use of airex for improved comfort. Pt initially requires mod A to stand, increases to max A with onset of fatigue. Pt also exhibits heavy UE reliance to bring himself to standing position. Pt fatigues very quickly in standing and only able to tolerate standing for 15 sec at most. Sit to supine min A needed for RLE management. Supine RLE and LLE heel slides with use of gait belt and therapist assist for AAROM. Pt exhibits decreased hip control with RLE as compared to LLE. Supine bridges x 5 reps before onset of fatigue with assist needed to stabilize BLE. SKFO x 10 reps on LLE, x 5 reps on RLE with assist needed for control on RLE. SAQ x 2 reps on RLE, x 10 reps on LLE. Pt requests to return to bed at end of session, sit to supine min  A for RLE management. Pt then reports possible incontinence of bowel once back in bed. Rolling L/R with min to mod A and use of bedrails for dependent pericare and brief change. Pt left seated in bed with needs in reach, bed alarm in place.  Therapy Documentation Precautions:  Precautions Precautions: Fall Precaution Comments: RLE hemiplegia, LLE hemipareisis, Restrictions Weight Bearing Restrictions: No      Therapy/Group: Individual Therapy   Excell Seltzer, PT, DPT, CSRS 04/07/2022, 12:27 PM

## 2022-04-07 NOTE — Care Management (Signed)
Inpatient [facility] Individual Statement of Services  Patient Name:  Austin Oliver  Date:  04/07/2022  Welcome to the [facility].  Our goal is to provide you with an individualized program based on your diagnosis and situation, designed to meet your specific needs.  With this comprehensive rehabilitation program, you will be expected to participate in at least 3 hours of rehabilitation therapies Monday-Friday, with modified therapy programming on the weekends.  Your rehabilitation program will include the following services:  Physical Therapy (PT), Occupational Therapy (OT), 24 hour per day rehabilitation nursing, Therapeutic Recreaction (TR), Psychology, Neuropsychology, Care Coordinator, Rehabilitation Medicine, [facility], and Other  Weekly team conferences will be held on Tuesdays to discuss your progress.  Your Inpatient Rehabilitation Care Coordinator will talk with you frequently to get your input and to update you on team discussions.  Team conferences with you and your family in attendance may also be held.  Expected length of stay: 24-28 days    Overall anticipated outcome: Supervision  Depending on your progress and recovery, your program may change. Your Inpatient Rehabilitation Care Coordinator will coordinate services and will keep you informed of any changes. Your Inpatient Rehabilitation Care Coordinator's name and contact numbers are listed  below.  The following services may also be recommended but are not provided by the [facility] will be made to provide these services after discharge if needed.  Arrangements include referral to agencies that provide these services.  Your insurance has been verified to be:  [facility] Medicare  Your primary doctor is:  Radames Mejorado  Pertinent information will be shared with your doctor and your insurance company.  Inpatient Rehabilitation Care Coordinator:  Cathleen Corti [phone] or ([phone]  Information discussed with and copy given to patient by: Rana Snare, 04/07/2022, 1:41 PM

## 2022-04-08 LAB — GLUCOSE, CAPILLARY
Glucose-Capillary: 137 mg/dL — ABNORMAL HIGH (ref 70–99)
Glucose-Capillary: 106 mg/dL — ABNORMAL HIGH (ref 70–99)
Glucose-Capillary: 128 mg/dL — ABNORMAL HIGH (ref 70–99)
Glucose-Capillary: 117 mg/dL — ABNORMAL HIGH (ref 70–99)

## 2022-04-08 MED ORDER — OXYCODONE HCL 5 MG PO TABS
5.0000 mg | ORAL_TABLET | ORAL | Status: DC | PRN
  Administered 2022-04-08 – 2022-04-10 (×4): 10 mg via ORAL
  Administered 2022-04-10 – 2022-04-11 (×3): 5 mg via ORAL
  Filled 2022-04-08: qty 1
  Filled 2022-04-08 (×2): qty 2
  Filled 2022-04-08 (×2): qty 1
  Filled 2022-04-08: qty 2
  Filled 2022-04-08 (×2): qty 1
  Filled 2022-04-08: qty 2
  Filled 2022-04-08: qty 1

## 2022-04-08 MED ORDER — ENOXAPARIN SODIUM 40 MG/0.4ML IJ SOSY
40.0000 mg | PREFILLED_SYRINGE | INTRAMUSCULAR | Status: DC
  Administered 2022-04-08 – 2022-04-14 (×7): 40 mg via SUBCUTANEOUS
  Filled 2022-04-08 (×8): qty 0.4

## 2022-04-08 MED ORDER — CLOPIDOGREL BISULFATE 75 MG PO TABS
75.0000 mg | ORAL_TABLET | Freq: Every day | ORAL | Status: DC
  Administered 2022-04-08 – 2022-04-14 (×7): 75 mg via ORAL
  Filled 2022-04-08 (×8): qty 1

## 2022-04-08 MED ORDER — ASPIRIN 81 MG PO TBEC
81.0000 mg | DELAYED_RELEASE_TABLET | Freq: Every day | ORAL | Status: DC
  Administered 2022-04-08 – 2022-04-14 (×7): 81 mg via ORAL
  Filled 2022-04-08 (×8): qty 1

## 2022-04-08 MED ORDER — OXYCODONE HCL 5 MG PO TABS
10.0000 mg | ORAL_TABLET | Freq: Once | ORAL | Status: AC
  Administered 2022-04-08: 10 mg via ORAL
  Filled 2022-04-08: qty 2

## 2022-04-08 MED ORDER — LIDOCAINE 5 % EX PTCH
2.0000 | MEDICATED_PATCH | CUTANEOUS | Status: DC
  Administered 2022-04-08 – 2022-04-11 (×4): 2 via TRANSDERMAL
  Filled 2022-04-08 (×7): qty 2

## 2022-04-08 NOTE — Progress Notes (Signed)
Physical Therapy Session Note  Patient Details  Name: Austin Oliver MRN: [id number] Date of Birth: 18-Jun-1956  Today's Date: 04/08/2022  Pt missed 60 min of skilled PT.   Pt presents in bed, declining therapy today stating he has a fever, too tired, and too much pain from biopsy yesterday. Points to R shoulder/chest area and states he does not feel like he can even lift his arm. Offered to help him OOB or change sweaty clothing, but pt still declined. He says he hopes if he rests today he could try therapy again tomorrow. Will follow up as able.    Therapy Documentation Precautions:  Precautions Precautions: Fall Precaution Comments: RLE hemiplegia, LLE hemipareisis, Restrictions Weight Bearing Restrictions: No General: PT Amount of Missed Time (min): 60 Minutes PT Missed Treatment Reason: Patient fatigue;Pain;Patient unwilling to participate     Therapy/Group: Individual Therapy  Canary Brim Ivory Broad, PT, DPT, CBIS  04/08/2022, 10:32 AM

## 2022-04-08 NOTE — Progress Notes (Signed)
PROGRESS NOTE   Subjective/Complaints:  Pt reports feels awful this AM- having pain in RUQ every time takes breath in and out.  Pain is horrific- doesn't want to do therapy today since hurts so bad from liver biopsy.   Went back to check on him- after changing pain meds to 10 mg Oxy, pain is better, but still refusing therapy.   Still having incontinent BM's.  Has had 3-4 medium ot large BM's yesterday and 2-3 small ones today- last KUB 5/21 showed significant constipation- is better clinically.   ROS:  Pt denies SOB, abd pain, CP, N/V/C/D, and vision changes  Objective:   DG Abd 1 View  Result Date: 04/06/2022 CLINICAL DATA:  Abdominal pain EXAM: ABDOMEN - 1 VIEW COMPARISON:  04/04/2022 FINDINGS: There remains residual contrast within the rectosigmoid. Stool is present throughout the colon. No dilated loops. No acute osseous abnormality. Left hip arthroplasty. IMPRESSION: Persistent evidence of constipation. Electronically Signed   By: Macy Mis M.D.   On: 04/06/2022 13:02   IR US Guide Bx Asp/Drain  Result Date: 04/07/2022 INDICATION: Multiple hepatic lesions EXAM: Ultrasound-guided biopsy of left liver mass MEDICATIONS: None. ANESTHESIA/SEDATION: Moderate (conscious) sedation was employed during this procedure. A total of Versed 1 mg and Fentanyl 50 mcg was administered intravenously by the radiology nurse. Total intra-service moderate Sedation Time: 10 minutes. The patient's level of consciousness and vital signs were monitored continuously by radiology nursing throughout the procedure under my direct supervision. COMPLICATIONS: None immediate. PROCEDURE: Informed written consent was obtained from the patient after a thorough discussion of the procedural risks, benefits and alternatives. All questions were addressed. Maximal Sterile Barrier Technique was utilized including caps, mask, sterile gowns, sterile gloves, sterile  drape, hand hygiene and skin antiseptic. A timeout was performed prior to the initiation of the procedure. Patient position supine on the ultrasound table. Epigastric skin prepped and draped in usual sterile fashion. Following local lidocaine administration, 17 gauge introducer needle was advanced into 1 of the left hepatic lobe lesions, and 4- 18 gauge cores were obtained utilizing continuous ultrasound guidance. Gelfoam slurry was administered through the introducer needle at the biopsy site. Samples were sent to pathology in formalin. Needle removed and hemostasis achieved with 5 minutes of manual compression. Post procedure ultrasound images showed no evidence of significant hemorrhage. IMPRESSION: Ultrasound-guided biopsy of left liver mass as above. Electronically Signed   By: Miachel Roux M.D.   On: 04/07/2022 16:20    Recent Labs    04/07/22 0524  WBC 3.5*  HGB 9.6*  HCT 28.5*  PLT 126*   Recent Labs    04/06/22 1145 04/07/22 0524  NA 133* 135  K 4.0 4.0  CL 96* 96*  CO2 25 27  GLUCOSE 111* 119*  BUN 23 23  CREATININE 1.21 1.31*  CALCIUM 8.5* 8.4*    Intake/Output Summary (Last 24 hours) at 04/08/2022 0915 Last data filed at 04/08/2022 0700 Gross per 24 hour  Intake 240 ml  Output 1350 ml  Net -1110 ml        Physical Exam: Vital Signs Blood pressure 108/67, pulse 79, temperature 98.9 F (37.2 C), temperature source Oral, resp. rate 18, height  $'5\' 9"'n$  (1.753 m), weight 92.1 kg, SpO2 96 %.   General: awake, alert, appropriate, laying in bed; wincing with taking big breath; NAD HENT: conjugate gaze; oropharynx moist CV: regular rate; no JVD Pulmonary: CTA B/L; no W/R/R- good air movement GI: soft, NT, ND, slightly hyperactive; (+)BS- bandage on RUQ from liver biopsy Psychiatric: appropriate Neurological: Ox3 Skin- no skin breakdown on backside Neurological:     Mental Status: He is alert and oriented to person, place, and time.     Comments: Speech clear. Able to  answer orientation questions and follow simple commands without difficulty. Unable to lift right lower extremity, LLE 4/5, b/l UE 4/5 with the exception of EE 3/5  Extremities: trace pedal edema present bilaterally.   Assessment/Plan: 1. Functional deficits which require 3+ hours per day of interdisciplinary therapy in a comprehensive inpatient rehab setting. Physiatrist is providing close team supervision and 24 hour management of active medical problems listed below. Physiatrist and rehab team continue to assess barriers to discharge/monitor patient progress toward functional and medical goals  Care Tool:  Bathing    Body parts bathed by patient: Right arm, Left arm, Chest, Abdomen, Face   Body parts bathed by helper: Front perineal area, Buttocks, Right upper leg, Left upper leg, Right lower leg, Left lower leg     Bathing assist Assist Level: Maximal Assistance - Patient 24 - 49%     Upper Body Dressing/Undressing Upper body dressing   What is the patient wearing?: Pull over shirt    Upper body assist Assist Level: Minimal Assistance - Patient > 75%    Lower Body Dressing/Undressing Lower body dressing      What is the patient wearing?: Hospital gown only, Incontinence brief     Lower body assist Assist for lower body dressing: Total Assistance - Patient < 25%     Toileting Toileting    Toileting assist Assist for toileting: Total Assistance - Patient < 25%     Transfers Chair/bed transfer  Transfers assist  Chair/bed transfer activity did not occur: Safety/medical concerns  Chair/bed transfer assist level: Minimal Assistance - Patient > 75% (SB transfer)     Locomotion Ambulation   Ambulation assist   Ambulation activity did not occur: Safety/medical concerns          Walk 10 feet activity   Assist  Walk 10 feet activity did not occur: Safety/medical concerns        Walk 50 feet activity   Assist Walk 50 feet with 2 turns activity did  not occur: Safety/medical concerns         Walk 150 feet activity   Assist Walk 150 feet activity did not occur: Safety/medical concerns         Walk 10 feet on uneven surface  activity   Assist Walk 10 feet on uneven surfaces activity did not occur: Safety/medical concerns         Wheelchair     Assist Is the patient using a wheelchair?: Yes Type of Wheelchair: Manual Wheelchair activity did not occur: Safety/medical concerns  Wheelchair assist level: Supervision/Verbal cueing Max wheelchair distance: 150'    Wheelchair 50 feet with 2 turns activity    Assist    Wheelchair 50 feet with 2 turns activity did not occur: Safety/medical concerns   Assist Level: Supervision/Verbal cueing   Wheelchair 150 feet activity     Assist  Wheelchair 150 feet activity did not occur: Safety/medical concerns   Assist Level: Supervision/Verbal cueing   Blood  pressure 108/67, pulse 79, temperature 98.9 F (37.2 C), temperature source Oral, resp. rate 18, height '5\' 9"'$  (1.753 m), weight 92.1 kg, SpO2 96 %.  Medical Problem List and Plan: 1. 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  2.  Impaired mobility: continue Lovenox             -antiplatelet therapy: DAPT on hold for procedure 3. Pain Management:  Tylenol prn.  4. Mood: LCSW to follow for evaluation and support.              -antipsychotic agents: N/A 5. Neuropsych: This patient is capable of making decisions on her own behalf. 6. Skin/Wound Care: Routine pressure relief measures.  7. Fluids/Electrolytes/Nutrition: Encourage fluid intake. Recheck CMET in am.  8. CAD/SSS s/p ICD/PPM: On Zetia and Ranexa--ASA/Plavix on hold for procedure 9. T2DM: Hgb  A1c has dropped from 6.8-->5.4 since 11/19/21.             --has been off metformin since last admission.  --continue to  monitor BS ac/hs and [place] SSI for elevated BS.   5/23- CBGs 108-153- off meds- con't SSI 10: Anemia of chronic disease: Recheck CBC in am.  11. Thrombocytopenia: Has been 121 since 04/30-->115. --off ASA/Plavix --monitor for signs of bleeding.  12. Pre-renal azotemia: Encourage fluid intake.   5/23- Cr up slightly to 1.31- from average of 1.2- will push fluids-  13. Concerns of metastatic disease: Liver biopsy 04/07/22 for work up  5/23- liver biopsy results pending 14. Urinary retention: Foley d/ced. Continue Flomax/Proscar. Increase Flomax to 0.'8mg'$ . Add bethenacol '5mg'$  TID  5/23- still requiring caths- q6 hours- will con't to push fluids 15. COPD: Respiratory status stable. Will change Yupelri to MDI per patient request.  16. Right knee buckling: discussed importance of quadricpes strengthening 17. Numbness and tingling of lower extremities: discussed Qutenza as an option outpatient.  18. Post TKA right knee pain: discussed that Qutenza can be tried on knee as well outpatient.  19. Hyperkalemia: given kayexalate today 20. Constipation in setting of neurogenic bowel: give kayexalate today  5/23- has been cleaned out- on bowel program nightly 21. Abdominal pain: still constipation on KUB today despite BM today- will give milk of mag to help clear out.    I spent a total of 55   minutes on total care today- >50% coordination of care- due to seeing pt twice, discussing liver biopsy and pain with pt and therapy and nursing; will d/c IV- also team conference   LOS: 4 days A FACE TO FACE EVALUATION WAS PERFORMED  Kenedie Dirocco 04/08/2022, 9:15 AM

## 2022-04-08 NOTE — Progress Notes (Signed)
Physical Therapy Session Note  Patient Details  Name: Austin Oliver MRN: [id number] Date of Birth: November 26, 1955  Today's Date: 04/08/2022 PT Individual Time: 1610-1640 PT Individual Time Calculation (min): 30 min   Short Term Goals: Week 1:  PT Short Term Goal 1 (Week 1): Pt will perform overall bed mobility with MinA. PT Short Term Goal 2 (Week 1): Pt will perform sit<>stand transfers with overall MinA to LRAD. PT Short Term Goal 3 (Week 1): Pt will perform stand pivot transfers with mod A using LRAD. PT Short Term Goal 4 (Week 1): Pt will ambulate at least 20 feet with ModA using LRAD. PT Short Term Goal 5 (Week 1): Pt will propel w/c at least 100 ft with up to Jack.  Skilled Therapeutic Interventions/Progress Updates:     Pt received supine in bed and agrees to therapy. Reports pain in R flank from biopsy. Number not provided. PT provides repositioning and rest breaks to manage pain. Pt performs bilateral roll with modA to assist with donning shorts. R sidelying to sit with modA. Pt performs slideboard transfer to [facility] with modA +2 and cues for anterior trunk lean and hand placement, as well as sequencing.  Pt performs transfer training and strengthening in parallel bars. Pt performs x2 reps total os sit to stand with extended seated rest break in between. Pt requires modA +2 to stand and requires cues for hip and trunk extension, as well as tactile cueing for knee extension. Pt reports pain in R knee and R groin, limiting tolerance to standing to <15 seconds.   Pt preforms slideboard transfer back to bed with modA +2 and same cueing. Sit to supine with modA +2. Left with alarm intact and all needs within reach.  Therapy Documentation Precautions:  Precautions Precautions: Fall Precaution Comments: RLE hemiplegia, LLE hemipareisis, Restrictions Weight Bearing Restrictions: No   Therapy/Group: Individual Therapy  Breck Coons, PT, DPT 04/08/2022, 5:12 PM

## 2022-04-08 NOTE — Patient Care Conference (Signed)
Inpatient RehabilitationTeam Conference and Plan of Care Update Date: 04/08/2022   Time: 11:12 AM    Patient Name: Austin Oliver      Medical Record Number: [id number]  Date of Birth: 01-Nov-1956 Sex: Male         Room/Bed: 4W20C/4W20C-01 Payor Info: Payor: Theme park manager MEDICARE / Plan: UHC MEDICARE / Product Type: *No Product type* /    Admit Date/Time:  04/04/2022  2:24 PM  Primary Diagnosis:  Spinal cord infarction [facility])  Hospital Problems: Principal Problem:   Spinal cord infarction [facility])    Expected Discharge Date: Expected Discharge Date: 05/03/22  Team Members Present: Physician leading conference: Dr. Courtney Heys Social Worker Present: Loralee Pacas, [place] Nurse Present: Dorthula Nettles, RN PT Present: Leavy Cella, PT OT Present: Roanna Epley, COTA;Jennifer Tamala Julian, OT PPS Coordinator present : Gunnar Fusi, SLP     Current Status/Progress Goal Weekly Team Focus  Bowel/Bladder   incontinent bowel, I&O cathing  regain continence  toilet q 2 hrs and prn   Swallow/Nutrition/ Hydration             ADL's   sitting balance-min A/CGA; bathing-mod A; LB dressing-tot A; SB tranfsers level surface-min A; fatigues easily; toileting-dependent  min A overall  BADL retraining, activity tolerance, functional tranfsers, sitting balance, education   Mobility   mod A rolling, min to mod A supine to/from sit, min to mod A SB transfer, Supervision w/c mobility, mod to max A to stand in // bars (fatigues very quickly)  CGA transfers, has gait goals that may need to be d/c pending progress, Supervision w/c mobility  endurance, transfers, standing as safe and able, LE NMR, SCI edu   Communication             Safety/Cognition/ Behavioral Observations            Pain   no reports of pain  remain pain free  assess pain q 4hr and prn   Skin   abdominal wound with foam dressing  no new breakdown  assess skin q shift and prn     Discharge Planning:  D/c to home with 24/7 care  from wife.   Team Discussion: Incomplete para d/t spinal cord infarct. Adjusting pain medication. On bowel program. Cr. 1.3, up slightly, please push fluids. Incontinent bowel, I&O cathing. Abdominal wound from liver biopsy covered with foam dressing. Wife also has medical issues, it will only be the 2 of them at discharge.  Patient on target to meet rehab goals: yes, min assist goals. Currently completing lower body at bed level with total/max assist. Slide board transfers at min assist to [place]. Fatigues easily. Min/mod transfers, mod/max standing. Supervision with WC mobility.  *See Care Plan and progress notes for long and short-term goals.   Revisions to Treatment Plan:  Adjusting medications.   Teaching Needs: Family education, medication/pain management, bowel/bladder management, skin/wound care, transfer training, etc.   Current Barriers to Discharge: Decreased caregiver support, Home enviroment access/layout, Incontinence, Neurogenic bowel and bladder, Wound care, Lack of/limited family support, and Weight bearing restrictions  Possible Resolutions to Barriers: Family education Follow-up therapy Order recommended DME     Medical Summary Current Status: neurogenic bowel and bladder- being cathed- incontinent bowel- got cleaned out; skin ok except liver biopsy that was done yesterday  Barriers to Discharge: Wound care;Neurogenic Bowel & Bladder;Medical stability;Decreased family/caregiver support;Home enviroment access/layout;Incontinence;Weight bearing restrictions  Barriers to Discharge Comments: wifge has health concerns- just the 2 of them-lots of family loss Possible Resolutions  to Barriers/Weekly Focus: min A goals- limited by pain esp since biopsy; Neurogenic bowel and caths-fatigues quickly- on bowel program- min-mod A right now - sliding board- standing very limited; d/c 6/17-   Continued Need for Acute Rehabilitation Level of Care: The patient requires daily medical  management by a physician with specialized training in physical medicine and rehabilitation for the following reasons: Direction of a multidisciplinary physical rehabilitation program to maximize functional independence : Yes Medical management of patient stability for increased activity during participation in an intensive rehabilitation regime.: Yes Analysis of laboratory values and/or radiology reports with any subsequent need for medication adjustment and/or medical intervention. : Yes   I attest that I was present, lead the team conference, and concur with the assessment and plan of the team.   Cristi Loron 04/08/2022, 4:52 PM

## 2022-04-08 NOTE — Progress Notes (Signed)
Inpatient Rehabilitation Care Coordinator Assessment and Plan Patient Details  Name: Austin Oliver MRN: [id number] Date of Birth: [birth year]  Today's Date: 04/08/2022  Hospital Problems: Principal Problem:   Spinal cord infarction [facility])  Past Medical History:  Past Medical History:  Diagnosis Date   Cirrhosis of liver ([place])    COPD (chronic obstructive pulmonary disease) ([place])    Coronary artery calcification seen on CAT scan 01/14/2017   Essential hypertension 09/11/2020   GERD (gastroesophageal reflux disease)    History of kidney stones    Hyperlipidemia    Hypothyroidism    OSA on CPAP    Mild with AHI 7.8/hr >>on CPAP   Osteoarthritis    Presence of permanent cardiac pacemaker    Shingles 2012   Past Surgical History:  Past Surgical History:  Procedure Laterality Date   CORONARY STENT INTERVENTION N/A 08/24/2020   Procedure: CORONARY STENT INTERVENTION;  Surgeon: [name], MD;  Location: [place] CV LAB;  Service: Cardiovascular;  Laterality: N/A;   HEEL SPUR SURGERY Left 80's   INTRAVASCULAR PRESSURE WIRE/FFR STUDY N/A 04/25/2021   Procedure: INTRAVASCULAR PRESSURE WIRE/FFR STUDY;  Surgeon: [name], MD;  Location: [place] CV LAB;  Service: Cardiovascular;  Laterality: N/A;   INTRAVASCULAR ULTRASOUND/IVUS N/A 08/24/2020   Procedure: Intravascular Ultrasound/IVUS;  Surgeon: [name], MD;  Location: [place] CV LAB;  Service: Cardiovascular;  Laterality: N/A;   IR US GUIDE BX ASP/DRAIN  04/07/2022   KNEE ARTHROPLASTY Right 10/31/2021   Procedure: COMPUTER ASSISTED TOTAL KNEE ARTHROPLASTY;  Surgeon: Rod Can, MD;  Location: [facility];  Service: Orthopedics;  Laterality: Right;   KNEE SURGERY Bilateral    numerous times   LEFT HEART CATH AND CORONARY ANGIOGRAPHY N/A 08/24/2020   Procedure: LEFT HEART CATH AND CORONARY ANGIOGRAPHY;  Surgeon: [name], MD;  Location: [place] CV LAB;  Service: Cardiovascular;  Laterality: N/A;    LEFT HEART CATH AND CORONARY ANGIOGRAPHY N/A 04/25/2021   Procedure: LEFT HEART CATH AND CORONARY ANGIOGRAPHY;  Surgeon: [name], MD;  Location: [place] CV LAB;  Service: Cardiovascular;  Laterality: N/A;   NECK SURGERY  70's   PACEMAKER IMPLANT N/A 12/04/2020   Procedure: PACEMAKER IMPLANT;  Surgeon: [name], MD;  Location: [place] CV LAB;  Service: Cardiovascular;  Laterality: N/A;   TOTAL HIP ARTHROPLASTY Left 03/09/2017   Procedure: LEFT TOTAL HIP ARTHROPLASTY ANTERIOR APPROACH;  Surgeon: Rod Can, MD;  Location: [place];  Service: Orthopedics;  Laterality: Left;  Dr. requesting RNFA   Social History:  reports that he quit smoking about 5 years ago. His smoking use included cigarettes. He has a 90.00 pack-year smoking history. He has never used smokeless tobacco. He reports that he does not drink alcohol and does not use drugs.  Family / Support Systems Marital Status: Married How Long?: 44 years Patient Roles: Spouse Spouse/Significant Other: Vickii Chafe (wife) Other Supports: None reported Anticipated Caregiver: Wife Ability/Limitations of Caregiver: wife has some medical issues, however, it does not impact her ability to assist Caregiver Availability: 24/7 Family Dynamics: Pt lives with his wife  Social History Preferred language: English Religion: Christian Cultural Background: He worked as Public relations account executive for 35 years Education: [place] - How often do you need to have someone help you when you read instructions, pamphlets, or other written material from your doctor or pharmacy?: Never Writes: Yes Employment Status: Retired Public relations account executive Issues: Denies Guardian/Conservator: N/A   Abuse/Neglect Abuse/Neglect Assessment Can Be Completed: Yes Physical Abuse:  Denies Verbal Abuse: Denies Sexual Abuse: Denies Exploitation of patient/patient's resources: Denies Self-Neglect: Denies  Patient response to: Social  Isolation - How often do you feel lonely or isolated from those around you?: Never  Emotional Status Pt's affect, behavior and adjustment status: Pt in good spirits at time of visit Recent Psychosocial Issues: Denies Psychiatric History: Denies Substance Abuse History: pt admits he quit smoking cigarettes in 2018 due to hip replacement. Denies etoh and rec drug use  Patient / Family Perceptions, Expectations & Goals Pt/Family understanding of illness & functional limitations: Pt and wife have a general understanding of pt care needs Premorbid pt/family roles/activities: Needed some help with self care, mobility, and stairs Anticipated changes in roles/activities/participation: Assistance with ADLs/IADLs Pt/family expectations/goals: Pt goal is to "get strength back in my legs so I can ahain and get balance back."  [employer]: None Premorbid Home Care/DME Agencies: None Transportation available at discharge: TBD Is the patient able to respond to transportation needs?: Yes In the past 12 months, has lack of transportation kept you from medical appointments or from getting medications?: No In the past 12 months, has lack of transportation kept you from meetings, work, or from getting things needed for daily living?: No Resource referrals recommended: Neuropsychology  Discharge Planning Living Arrangements: Spouse/significant other Support Systems: Spouse/significant other Type of Residence: Private residence Insurance Resources: Multimedia programmer (specify) (UHC Medicare) Museum/gallery curator Resources: Radio broadcast assistant Screen Referred: No Living Expenses: Medical laboratory scientific officer Management: Spouse Does the patient have any problems obtaining your medications?: No Home Management: Pt wife manages all homecare needs Patient/Family Preliminary Plans: No changes Care Coordinator Barriers to Discharge: Decreased caregiver support, Lack of/limited family support Care  Coordinator Anticipated Follow Up Needs: HH/OP Expected length of stay: d/c 6/17  Clinical Impression Pt is not a veteran. No HCPOA. DME: cane, RW, shower seat, w/c, and 3in1 BSC.   Pt is pleasant at time of encounter. Reports that his wife has been great at assisting him with his care needs overall. Continues to desire to be able to do for himself.   Rana Snare 04/08/2022, 5:48 PM

## 2022-04-08 NOTE — Progress Notes (Signed)
Occupational Therapy Note  Patient Details  Name: OSMIN WELZ MRN: [id number] Date of Birth: 1956-03-28  Today's Date: 04/08/2022 OT Missed Time: 42 Minutes Missed Time Reason: Patient fatigue;Pain  Pt continues to c/o pain from 5/22 biopsy. Pt reports that pain is improving but still unable to participate. Emphasized that therapy will resume tomorrow. Pt verbalized understanding.    Leotis Shames [facility] 04/08/2022, 11:49 AM

## 2022-04-08 NOTE — Progress Notes (Signed)
Occupational Therapy Note  Patient Details  Name: Austin Oliver MRN: [id number] Date of Birth: 1956/05/25  Today's Date: 04/08/2022 OT Missed Time: 75 Minutes Missed Time Reason: Pain;Patient fatigue  Pt resting in bed upon arrival. Pt in considerable pain 2/2 liver biopsy 5/22. Pt states he is in pain with "every breath." Pt reports that he was told he would have pain for 3-4 days and MD told him "do what you can." Pt missed 75 mins skilled OT services. Will attempt to see pt later.   Leotis Shames [facility] 04/08/2022, 9:17 AM

## 2022-04-08 NOTE — Progress Notes (Signed)
Patient ID: Austin Oliver, male   DOB: [birth year], 66 y.o.   MRN: [id number]  SW met with pt in room to introduce self, explain role, and discuss discharge process. SW provided updates from team conference and d/c date 6/17. Pt aware SW to follow-up with his wife.   26- SW left message for pt wife to inform on above, and requested follow-up to discuss in more detail.    Loralee Pacas, MSW, [place] Office: [phone] Cell: [phone] Fax: [phone]

## 2022-04-09 ENCOUNTER — Inpatient Hospital Stay (HOSPITAL_COMMUNITY): Payer: Medicare Other

## 2022-04-09 LAB — URINALYSIS, ROUTINE W REFLEX MICROSCOPIC
Bilirubin Urine: NEGATIVE
Glucose, UA: NEGATIVE mg/dL
Hgb urine dipstick: NEGATIVE
Ketones, ur: NEGATIVE mg/dL
Leukocytes,Ua: NEGATIVE
Nitrite: NEGATIVE
Protein, ur: NEGATIVE mg/dL
Specific Gravity, Urine: 1.02 (ref 1.005–1.030)
pH: 5 (ref 5.0–8.0)

## 2022-04-09 LAB — GLUCOSE, CAPILLARY
Glucose-Capillary: 107 mg/dL — ABNORMAL HIGH (ref 70–99)
Glucose-Capillary: 106 mg/dL — ABNORMAL HIGH (ref 70–99)
Glucose-Capillary: 90 mg/dL (ref 70–99)
Glucose-Capillary: 137 mg/dL — ABNORMAL HIGH (ref 70–99)

## 2022-04-09 NOTE — Progress Notes (Signed)
While giving incruse inhaler to pt and checking pulseox, pt intermittently falling asleep with RT in room. SATs while sleeping dropped to 87% but increased back to 94% after waking pt up. Will notify RN and MD.

## 2022-04-09 NOTE — Progress Notes (Signed)
Pt noted to have rash in RUQ under breast, red patches. Pt denies pain with rash. Rash was circled to monitor for spreading.    Pt also slurring some words. Full neuro exam performed w/swallow screen and passed by this nurse and Iona Beard, RN. Pt A&Ox4, denies CP/SOB. Provider will be notified.

## 2022-04-09 NOTE — Evaluation (Signed)
Recreational Therapy Assessment and Plan  Patient Details  Name: Austin Oliver MRN: [id number] Date of Birth: [birth year] Today's Date: 04/09/2022  Rehab Potential:  Good ELOS:   d/c 6/17  Assessment   Hospital Problem: Principal Problem:   Spinal cord infarction [facility])     Past Medical History:      Past Medical History:  Diagnosis Date   Cirrhosis of liver ([place])     COPD (chronic obstructive pulmonary disease) ([place])     Coronary artery calcification seen on CAT scan 01/14/2017   Essential hypertension 09/11/2020   GERD (gastroesophageal reflux disease)     History of kidney stones     Hyperlipidemia     Hypothyroidism     OSA on CPAP      Mild with AHI 7.8/hr >>on CPAP   Osteoarthritis     Presence of permanent cardiac pacemaker     Shingles 2012    Past Surgical History:       Past Surgical History:  Procedure Laterality Date   CORONARY STENT INTERVENTION N/A 08/24/2020    Procedure: CORONARY STENT INTERVENTION;  Surgeon: [name], MD;  Location: [place] [facility];  Service: Cardiovascular;  Laterality: N/A;   HEEL SPUR SURGERY Left 80's   INTRAVASCULAR PRESSURE WIRE/FFR STUDY N/A 04/25/2021    Procedure: INTRAVASCULAR PRESSURE WIRE/FFR STUDY;  Surgeon: [name], MD;  Location: [place] [facility];  Service: Cardiovascular;  Laterality: N/A;   INTRAVASCULAR ULTRASOUND/IVUS N/A 08/24/2020    Procedure: Intravascular Ultrasound/IVUS;  Surgeon: [name], MD;  Location: [place] [facility];  Service: Cardiovascular;  Laterality: N/A;   KNEE ARTHROPLASTY Right 10/31/2021    Procedure: COMPUTER ASSISTED TOTAL KNEE ARTHROPLASTY;  Surgeon: Rod Can, MD;  Location: [facility];  Service: Orthopedics;  Laterality: Right;   KNEE SURGERY Bilateral      numerous times   LEFT HEART CATH AND CORONARY ANGIOGRAPHY N/A 08/24/2020    Procedure: LEFT HEART CATH AND CORONARY ANGIOGRAPHY;  Surgeon: [name], MD;  Location: [place] [facility];  Service:  Cardiovascular;  Laterality: N/A;   LEFT HEART CATH AND CORONARY ANGIOGRAPHY N/A 04/25/2021    Procedure: LEFT HEART CATH AND CORONARY ANGIOGRAPHY;  Surgeon: [name], MD;  Location: [place] [facility];  Service: Cardiovascular;  Laterality: N/A;   NECK SURGERY   70's   PACEMAKER IMPLANT N/A 12/04/2020    Procedure: PACEMAKER IMPLANT;  Surgeon: [name], MD;  Location: [place] [facility];  Service: Cardiovascular;  Laterality: N/A;   TOTAL HIP ARTHROPLASTY Left 03/09/2017    Procedure: LEFT TOTAL HIP ARTHROPLASTY ANTERIOR APPROACH;  Surgeon: Rod Can, MD;  Location: [place];  Service: Orthopedics;  Laterality: Left;  Dr. requesting RNFA      Assessment & Plan Clinical Impression: Patient is a 66 y.o. year old male with recent admission to the hospital on 03/25/22 with recurrent falls and worsening B LE weakenss, R>L. CT suggestive of demylenating process vs microvascular ischemic disease. 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 Patient transferred to CIR on 04/04/2022 .     Pt presents with decreased activity tolerance, muscle paralysis, decreased functional mobility, decreased balance, feelings of stress Limiting pt's independence with leisure/community pursuits.  Met with pt t& wife today to discuss TR services including leisure education, activity analysis/modifications and  stress management.  Also discussed the importance of social, emotional, spiritual health in addition to physical health and their effects on overall health and wellness.  Both stated understanding.  Plan  Min 1 TR session >20 minutes per week during LOS  Recommendations for other services: Neuropsych  Discharge Criteria: Patient will be discharged from TR if patient refuses treatment 3 consecutive times  without medical reason.  If treatment goals not met, if there is a change in medical status, if patient makes no progress towards goals or if patient is discharged from hospital.  The above assessment, treatment plan, treatment alternatives and goals were discussed and mutually agreed upon: by patient  [place] 04/09/2022, 3:10 PM

## 2022-04-09 NOTE — Progress Notes (Signed)
Pt sleepy upon entering room. Pt reports feeling "off" but couldn't tell nursing what exactly was off. Neuro assessment completed, Pt was unable to tell where he was, but was able to name year and month. Pt unclear of why he was here or what unit he was on. Pt was lethargic throughout the day. Neuro assessments done by nursing. Pt had decrease in muscle strength in LLE. Pt was unable to lift in air or push against nurses hand when asked. RR alerted and stat CT ordered. See Chart for results. UA collected. Pt currently up in bed with wife at bedside, denies any pain. Oncoming nurse updated.

## 2022-04-09 NOTE — Progress Notes (Signed)
Occupational Therapy Note  Patient Details  Name: Austin Oliver MRN: [id number] Date of Birth: 07/19/1956  Today's Date: 04/09/2022 OT Missed Time: 75 Minutes Missed Time Reason: Patient fatigue;Other (comment)  Pt sleeping upon arrival. Min verbal cues to arouse. Pt with slurred speech and slow to respond to questions. Pt unable to keep eyes open. F/u with RN Loma Sousa who states she and MD aware and MD making changes to pain meds. Pt missed 75 mins skilled OT services 2/2 lethargy and unable to actively participate in therapy.    Leotis Shames [facility] 04/09/2022, 8:56 AM

## 2022-04-09 NOTE — Progress Notes (Signed)
PROGRESS NOTE   Subjective/Complaints:  Pt reports pain is doing better, but feels a little "not normal"- likes he's losing train of thought.  Denies pain right now- Changed pain meds yesterday- asked nursing to reduce Oxy dose when he gets it next and monitor behavior.   Pt also admits being cathed- no voiding yet.   ROS:   Pt denies SOB, abd pain, CP, N/V/C/D, and vision changes  Objective:   CT HEAD WO CONTRAST (5MM)  Result Date: 04/09/2022 CLINICAL DATA:  Mental status change.  History of stroke. EXAM: CT HEAD WITHOUT CONTRAST TECHNIQUE: Contiguous axial images were obtained from the base of the skull through the vertex without intravenous contrast. RADIATION DOSE REDUCTION: This exam was performed according to the departmental dose-optimization program which includes automated exposure control, adjustment of the mA and/or kV according to patient size and/or use of iterative reconstruction technique. COMPARISON:  CT head 03/25/2022.  MRI head 03/27/2022 FINDINGS: Brain: Patchy hypodensities in the cerebral white matter bilaterally, left greater than right are stable. No new area of infarct, hemorrhage, mass Vascular: Negative for hyperdense vessel Skull: Negative Sinuses/Orbits: Mild mucosal edema paranasal sinuses. Negative orbit Other: None IMPRESSION: No acute abnormality and no change from recent studies. Bilateral white matter hypodensities left greater than right are stable. Electronically Signed   By: Franchot Gallo M.D.   On: 04/09/2022 17:09    Recent Labs    04/07/22 0524  WBC 3.5*  HGB 9.6*  HCT 28.5*  PLT 126*   Recent Labs    04/07/22 0524  NA 135  K 4.0  CL 96*  CO2 27  GLUCOSE 119*  BUN 23  CREATININE 1.31*  CALCIUM 8.4*    Intake/Output Summary (Last 24 hours) at 04/09/2022 1905 Last data filed at 04/09/2022 1106 Gross per 24 hour  Intake 0 ml  Output 600 ml  Net -600 ml        Physical  Exam: Vital Signs Blood pressure 99/66, pulse 75, temperature 98.3 F (36.8 C), resp. rate 15, height '5\' 9"'$  (1.753 m), weight 92.1 kg, SpO2 94 %.    General: awake, alert, appropriate, laying supine in bed; NAD HENT: conjugate gaze; oropharynx moist CV: regular rate; no JVD Pulmonary: CTA B/L; no W/R/R- good air movement GI: soft, NT, ND, (+)BS Psychiatric: appropriate Neurological: Ox3, but slightly slow to respond- speaking al ittle slower but not slurred.  Skin- no skin breakdown on backside- slight red patch below R nipple- no dressing from liver biopsy spot Neurological:     Mental Status: He is alert and oriented to person, place, and time.     Comments: Speech clear. Able to answer orientation questions and follow simple commands without difficulty. Unable to lift right lower extremity, LLE 4/5, b/l UE 4/5 with the exception of EE 3/5  Extremities: trace pedal edema present bilaterally.   Assessment/Plan: 1. Functional deficits which require 3+ hours per day of interdisciplinary therapy in a comprehensive inpatient rehab setting. Physiatrist is providing close team supervision and 24 hour management of active medical problems listed below. Physiatrist and rehab team continue to assess barriers to discharge/monitor patient progress toward functional and medical goals  Care Tool:  Bathing    Body parts bathed by patient: Right arm, Left arm, Chest, Abdomen, Face   Body parts bathed by helper: Front perineal area, Buttocks, Right upper leg, Left upper leg, Right lower leg, Left lower leg     Bathing assist Assist Level: Maximal Assistance - Patient 24 - 49%     Upper Body Dressing/Undressing Upper body dressing   What is the patient wearing?: Pull over shirt    Upper body assist Assist Level: Minimal Assistance - Patient > 75%    Lower Body Dressing/Undressing Lower body dressing      What is the patient wearing?: Hospital gown only, Incontinence brief     Lower  body assist Assist for lower body dressing: Total Assistance - Patient < 25%     Toileting Toileting    Toileting assist Assist for toileting: Total Assistance - Patient < 25%     Transfers Chair/bed transfer  Transfers assist  Chair/bed transfer activity did not occur: Safety/medical concerns  Chair/bed transfer assist level: Minimal Assistance - Patient > 75% (SB transfer)     Locomotion Ambulation   Ambulation assist   Ambulation activity did not occur: Safety/medical concerns          Walk 10 feet activity   Assist  Walk 10 feet activity did not occur: Safety/medical concerns        Walk 50 feet activity   Assist Walk 50 feet with 2 turns activity did not occur: Safety/medical concerns         Walk 150 feet activity   Assist Walk 150 feet activity did not occur: Safety/medical concerns         Walk 10 feet on uneven surface  activity   Assist Walk 10 feet on uneven surfaces activity did not occur: Safety/medical concerns         Wheelchair     Assist Is the patient using a wheelchair?: Yes Type of Wheelchair: Manual Wheelchair activity did not occur: Safety/medical concerns  Wheelchair assist level: Supervision/Verbal cueing Max wheelchair distance: 150'    Wheelchair 50 feet with 2 turns activity    Assist    Wheelchair 50 feet with 2 turns activity did not occur: Safety/medical concerns   Assist Level: Supervision/Verbal cueing   Wheelchair 150 feet activity     Assist  Wheelchair 150 feet activity did not occur: Safety/medical concerns   Assist Level: Supervision/Verbal cueing   Blood pressure 99/66, pulse 75, temperature 98.3 F (36.8 C), resp. rate 15, height '5\' 9"'$  (1.753 m), weight 92.1 kg, SpO2 94 %.  Medical Problem List and Plan: 1. Functional deficits secondary to spinal cord infarction- incomplete paraplegia- nontraumatic             -patient may shower             -ELOS/Goals: S/Min A 20-22  days             Con't CIR- PT and OT  D/c 6/17  Con't CIR- PT and OT- rapid response this evening due to not acting like himself and hx of strokes- head CT (-) for bleed.  2.  Impaired mobility: continue Lovenox             -antiplatelet therapy: DAPT on hold for procedure  5/24- Plavix and ASA restarted today 3. Pain Management:  Tylenol prn.  4. Mood: LCSW to follow for evaluation and support.              -antipsychotic agents: N/A 5.  Neuropsych: This patient is capable of making decisions on her own behalf. 6. Skin/Wound Care: Routine pressure relief measures.  7. Fluids/Electrolytes/Nutrition: Encourage fluid intake. Recheck CMET in am.  8. CAD/SSS s/p ICD/PPM: On Zetia and Ranexa--ASA/Plavix on hold for procedure 9. T2DM: Hgb A1c has dropped from 6.8-->5.4 since 11/19/21.             --has been off metformin since last admission.  --continue to  monitor BS ac/hs and [place] SSI for elevated BS.   5/23- CBGs 108-153- off meds- con't SSI 5/24- CBGs 90-113- con't regimen 10: Anemia of chronic disease: Recheck CBC in am.  11. Thrombocytopenia: Has been 121 since 04/30-->115. --off ASA/Plavix --monitor for signs of bleeding.  5/24- will recheck labs in AM 12. Pre-renal azotemia: Encourage fluid intake.   5/23- Cr up slightly to 1.31- from average of 1.2- will push fluids-   5/24- will recheck BMP tomorrow AM 13. Concerns of metastatic disease: Liver biopsy 04/07/22 for work up  5/23- liver biopsy results pending 14. Urinary retention: Foley d/ced. Continue Flomax/Proscar. Increase Flomax to 0.'8mg'$ . Add bethenacol '5mg'$  TID  5/23- still requiring caths- q6 hours- will con't to push fluids 15. COPD: Respiratory status stable. Will change Yupelri to MDI per patient request.  16. Right knee buckling: discussed importance of quadricpes strengthening 17. Numbness and tingling of lower extremities: discussed Qutenza as an option outpatient.  18. Post TKA right knee pain: discussed that Qutenza  can be tried on knee as well outpatient.  19. Hyperkalemia: given kayexalate today 20. Constipation in setting of neurogenic bowel: give kayexalate today  5/23- has been cleaned out- on bowel program nightly 21. Abdominal pain: still constipation on KUB today despite BM today- will give milk of mag to help clear out.    I spent a total of  41  minutes on total care today- >50% coordination of care- due to d/w team about cognitive changes, weakness this evening as well as seeing pt 2x- to assess skin changes   LOS: 5 days A FACE TO FACE EVALUATION WAS PERFORMED  Merrell [place] 04/09/2022, 7:05 PM

## 2022-04-09 NOTE — Progress Notes (Signed)
Alerted by nursing staff that rapid response was called due to onset of left lower extremity weakness as well as increased lethargy throughout the day. He is somewhat somnolent but answers questions and follows commands. Speech is clear. VSS. Glucose is 90. Wife at bedside. Discussed with RR nurses and stat CTH obtained. Unable to left legs off bed, 5/5 bilateral hand grip strength. His DAPT was restarted yesterday morning.  Narrative & Impression  CLINICAL DATA:  Mental status change.  History of stroke.   EXAM: CT HEAD WITHOUT CONTRAST   TECHNIQUE: Contiguous axial images were obtained from the base of the skull through the vertex without intravenous contrast.   RADIATION DOSE REDUCTION: This exam was performed according to the departmental dose-optimization program which includes automated exposure control, adjustment of the mA and/or kV according to patient size and/or use of iterative reconstruction technique.   COMPARISON:  CT head 03/25/2022.  MRI head 03/27/2022   FINDINGS: Brain: Patchy hypodensities in the cerebral white matter bilaterally, left greater than right are stable.   No new area of infarct, hemorrhage, mass   Vascular: Negative for hyperdense vessel   Skull: Negative   Sinuses/Orbits: Mild mucosal edema paranasal sinuses. Negative orbit   Other: None   IMPRESSION: No acute abnormality and no change from recent studies. Bilateral white matter hypodensities left greater than right are stable.     Electronically Signed   By: Franchot Gallo M.D.   On: 04/09/2022 17:09      Discussed with Dr. Dagoberto Ligas and updated wife.

## 2022-04-09 NOTE — Progress Notes (Signed)
Physical Therapy Session Note  Patient Details  Name: Austin Oliver MRN: [id number] Date of Birth: 19-Jan-1956  Today's Date: 04/09/2022 PT Individual Time: 4098-1191 and 1503-1530 PT Individual Time Calculation (min): 43 min and 27 min  Short Term Goals: Week 1:  PT Short Term Goal 1 (Week 1): Pt will perform overall bed mobility with MinA. PT Short Term Goal 2 (Week 1): Pt will perform sit<>stand transfers with overall MinA to LRAD. PT Short Term Goal 3 (Week 1): Pt will perform stand pivot transfers with mod A using LRAD. PT Short Term Goal 4 (Week 1): Pt will ambulate at least 20 feet with ModA using LRAD. PT Short Term Goal 5 (Week 1): Pt will propel w/c at least 100 ft with up to [place].  Skilled Therapeutic Interventions/Progress Updates: Pt presented in bed completing in/out cath from nsg and agreeable to therapy. Pt denies pain at rest but c/o some pain in back and R knee when performing bed mobility and when transferring to sitting. Pt agreeable to don clothing and attempt OOB. Pt performed rolling to L with modA with PTA noting bowel incontinence. Pt performing rolling L/R with modA both directions to allow PTA to perform peri-care. PTA noting x2 blanching red areas on sacrum with nsg notified and foam dressing applied. PTA donned brief and shorts total A. Pt then attempted to perform supine to sit however noted increased difficulty processing sequencing and delayed time following commands. Pt ultimately required maxA for supine to sit with PTA noting increased R posteriorlateral lean upon sitting. Despite max cues pt was unable to achieve midline without modA and pt was unable to bring hands forward to more neutral place independently. Pt also noted to change to excessive forward lean with pt unable to correct. Due to pt's difficulty PTA donned shirt maxA. Due to pt's difficulty maintaining upright position deferred transfer to w/c. Pt however was too close to footboard to safely transfer to  supine therefore performed with maxA x 2 and dependent x 2 with use of draw sheet to move to [facility]. Pt was repositioned to comfort and set up with lunch tray. Pt left with call bell within reach, bed alarm on, and current needs met.   Tx2; Pt presented in bed with wife present agreeable to therapy. Pt continues to demonstrate significant lethargy however was able to communicate more clearly than during am session. PTA talked with wife regarding current functional status and that pt is demonstrating more deficits that a few days ago. PTA advised per notes that there has been a change in pain meds and that may be causing increased lethargy and delayed responses. PTA explained to wife what occurred during am session and pt was agreeable to attempt EOB this session despite significant fatigue. Pt then attempted to perform supine to sit with PTA providing max cues for sequencing however pt was unable to sequence moving LLE to either adduct or abduct. PTA also instructed pt to attempt a heel slides which pt was unable to perform however ?lethargy vs following commands vs weakness. PTA notified nsg of findings. Pt left in bed at end of session with bed alarm on, call bell within reach and needs met.      Therapy Documentation Precautions:  Precautions Precautions: Fall Precaution Comments: RLE hemiplegia, LLE hemipareisis, Restrictions Weight Bearing Restrictions: No General: PT Amount of Missed Time (min): 17 Minutes PT Missed Treatment Reason: Other (Comment) (lunch) Vital Signs: Oxygen Therapy SpO2: 94 % O2 Device: Room Air Pain:  Mobility:   Locomotion :    Trunk/Postural Assessment :    Balance:   Exercises:   Other Treatments:      Therapy/Group: Individual Therapy    04/09/2022, 12:37 PM  

## 2022-04-10 LAB — GLUCOSE, CAPILLARY
Glucose-Capillary: 117 mg/dL — ABNORMAL HIGH (ref 70–99)
Glucose-Capillary: 111 mg/dL — ABNORMAL HIGH (ref 70–99)
Glucose-Capillary: 111 mg/dL — ABNORMAL HIGH (ref 70–99)
Glucose-Capillary: 101 mg/dL — ABNORMAL HIGH (ref 70–99)

## 2022-04-10 LAB — CBC WITH DIFFERENTIAL/PLATELET
Abs Immature Granulocytes: 0.03 10*3/uL (ref 0.00–0.07)
Basophils Absolute: 0 10*3/uL (ref 0.0–0.1)
Basophils Relative: 1 %
Eosinophils Absolute: 0 10*3/uL (ref 0.0–0.5)
Eosinophils Relative: 1 %
HCT: 28.1 % — ABNORMAL LOW (ref 39.0–52.0)
Hemoglobin: 9.2 g/dL — ABNORMAL LOW (ref 13.0–17.0)
Immature Granulocytes: 1 %
Lymphocytes Relative: 22 %
Lymphs Abs: 0.7 10*3/uL (ref 0.7–4.0)
MCH: 28.3 pg (ref 26.0–34.0)
MCHC: 32.7 g/dL (ref 30.0–36.0)
MCV: 86.5 fL (ref 80.0–100.0)
Monocytes Absolute: 0.7 10*3/uL (ref 0.1–1.0)
Monocytes Relative: 23 %
Neutro Abs: 1.7 10*3/uL (ref 1.7–7.7)
Neutrophils Relative %: 52 %
Platelets: 144 10*3/uL — ABNORMAL LOW (ref 150–400)
RBC: 3.25 MIL/uL — ABNORMAL LOW (ref 4.22–5.81)
RDW: 16.8 % — ABNORMAL HIGH (ref 11.5–15.5)
WBC: 3.3 10*3/uL — ABNORMAL LOW (ref 4.0–10.5)
nRBC: 0 % (ref 0.0–0.2)

## 2022-04-10 LAB — BASIC METABOLIC PANEL
Anion gap: 15 (ref 5–15)
BUN: 34 mg/dL — ABNORMAL HIGH (ref 8–23)
CO2: 24 mmol/L (ref 22–32)
Calcium: 8.7 mg/dL — ABNORMAL LOW (ref 8.9–10.3)
Chloride: 91 mmol/L — ABNORMAL LOW (ref 98–111)
Creatinine, Ser: 1.24 mg/dL (ref 0.61–1.24)
GFR, Estimated: >60 mL/min (ref >60)
Glucose, Bld: 105 mg/dL — ABNORMAL HIGH (ref 70–99)
Potassium: 4.2 mmol/L (ref 3.5–5.1)
Sodium: 130 mmol/L — ABNORMAL LOW (ref 135–145)

## 2022-04-10 LAB — SURGICAL PATHOLOGY

## 2022-04-10 MED ORDER — GLUCERNA SHAKE PO LIQD
237.0000 mL | Freq: Three times a day (TID) | ORAL | Status: DC
  Administered 2022-04-10: 237 mL via ORAL

## 2022-04-10 MED ORDER — SODIUM CHLORIDE 0.9 % IV SOLN: INTRAVENOUS | Status: DC

## 2022-04-10 MED ORDER — BOOST / RESOURCE BREEZE PO LIQD CUSTOM
1.0000 | Freq: Three times a day (TID) | ORAL | Status: DC
  Administered 2022-04-11 – 2022-04-13 (×6): 1 via ORAL

## 2022-04-10 NOTE — Progress Notes (Signed)
Occupational Therapy Session Note  Patient Details  Name: RC AMISON MRN: [id number] Date of Birth: [birth year]  Today's Date: 04/10/2022 OT Individual Time: 0700-0811 OT Individual Time Calculation (min): 71 min    Short Term Goals: Week 1:  OT Short Term Goal 1 (Week 1): Patient will complete toilet transfer with max A of 1 OT Short Term Goal 2 (Week 1): Patient will thread one LE into pant leg using adaptive equipment OT Short Term Goal 3 (Week 1): Patient will maintain dynamic balance at EOB with no more than CGA  Skilled Therapeutic Interventions/Progress Updates:    Pt resting in bed upon arrival with wife present. Pt oriented to place, date, and situation this morning. Pt in considerable pain with all movement (see below). Pt also describes burning sensation in BLE with PROM. OT intervention with focus on bed mobility, sitting balance, BADLs, and activity tolerance. Pt dependent for LB dressing at bed level this monring. Min A for rolling R/L using bed rails. Supine>sit EOB with max A this morning with increased pain during transition. Sitting balance initally with min A/CGA fading to max A 2/2 increased pain. Pt unable to don shirt while seated EOB. Pt returned to supine with max A. Pt donned shirt with min A at bed level. MD present during segment when pt sitting EOB. Breakfast tray on bedside table setup for pt to eat breakfast at bed level. Pt's wife stated that pt had not been eating much over the past few days. Pt remained in bed. Bed alarm activated. All needs within reach. Pt's wife present.   Therapy Documentation Precautions:  Precautions Precautions: Fall Precaution Comments: RLE hemiplegia, LLE hemipareisis, Restrictions Weight Bearing Restrictions: No Pain: Pain Assessment Faces Pain Scale: Hurts whole lot Pain Type: Acute pain;Neuropathic pain Pain Location: Back Pain Orientation: Lower Pain Descriptors / Indicators: Aching Pain Frequency: Constant Pain Onset:  With Activity Pain Intervention(s): MD notified (Comment);RN made aware;Repositioned   Therapy/Group: Individual Therapy  Leroy Libman 04/10/2022, 8:12 AM

## 2022-04-10 NOTE — Progress Notes (Signed)
Nutrition Follow-up  DOCUMENTATION CODES:   Not applicable  INTERVENTION:   Boost Breeze po TID, each supplement provides 250 kcal and 9 grams of protein Magic cup TID with meals, each supplement provides 290 kcal and 9 grams of protein Continue MVI with minerals daily Encourage PO intake Discontinue Juven and Ensure Max  NUTRITION DIAGNOSIS:   Increased nutrient needs related to chronic illness (COPD, cirrhosis) as evidenced by estimated needs. - Ongoing  GOAL:   Patient will meet greater than or equal to 90% of their needs - Progressing  MONITOR:   PO intake, Supplement acceptance, Labs, Weight trends  REASON FOR ASSESSMENT:   Malnutrition Screening Tool    ASSESSMENT:   66 year old male with PMH of recurrent strokes, CAD, COPD, cirrhosis, HTN, HLD, GERD, T2DM, and hypothyroidism who presented to ED with worsening weakness. Pt admitted to acute care with spinal cord infarction, suspected demyelinating changes in brain, occult malignancy. Pt transferred to CIR on 5/19.  5/22 - Liver biopsy   Pt laying in bed, family at bedside.   Pt reports that his appetite has been very poor, eating <50% of his trays at meal times. Discussed that pt PO intake is important to prevent weight loss and have strength for therapy. Discussed alternative supplements for pt to trial due to not like Ensure or Glucerna. Pt agreeable to try [employer] and [employer].  Per EMR, pt PO intake includes: 5/21: Lunch 100%, Dinner 85% 5/22: Dinner 100% 5/23: Breakfast 0%, Lunch 100%, Dinner 25% 5/24: Lunch 0% 5/25: Breakfast 30%  Pt asking for diet education for heath healthy and diabetes. RD to provide education at follow-up.   Medications reviewed and include: Dulcolax, SSI 0-5 units daily +0-9 units TID, MVI, Protonix,  Labs reviewed: Sodium 130, BUN 34, 24 hr 90-137  NUTRITION - FOCUSED PHYSICAL EXAM:    Diet Order:   Diet Order             Diet Carb Modified Fluid consistency: Thin;  Room service appropriate? Yes  Diet effective now                   EDUCATION NEEDS:   Not appropriate for education at this time  Skin:  Skin Assessment: Reviewed RN Assessment  Last BM:  5/24 - Type 6  Height:  Ht Readings from Last 1 Encounters:  04/04/22 '5\' 9"'$  (1.753 m)   Weight:  Wt Readings from Last 1 Encounters:  04/04/22 92.1 kg   BMI:  Body mass index is 29.98 kg/m.  Estimated Nutritional Needs:  Kcal:  2000-2200 Protein:  100-120 grams Fluid:  2.0 L   Hermina Barters RD, LDN Clinical Dietitian See [facility] for contact information.

## 2022-04-10 NOTE — Progress Notes (Signed)
Occupational Therapy Weekly Progress Note  Patient Details  Name: Austin Oliver MRN: [id number] Date of Birth: 1955-12-14  Beginning of progress report period: Apr 05, 2021 End of progress report period: Apr 10, 2022  Patient has met 0 of 3 short term goals.  Pt was making steady progress following initial evaluation but has since decreased. Bed mobility initially with min A and now max A. Sitting balance initially with min A/CGA and now max A. Pt reports significant pain with bed mobility and sitting EOB. SB transfer initially with min A and pt now unable to actively participate in SB tranfsers 2/2 pain. LB dressing at bed level with tot A and UB dressing at bed level with min A. Pt unable to sit EOB to complete UB dressing 2/2 pt requires BUE support when sitting EOB. MD aware of functional changes. Pt with new dx of High-grade B-cell lymphoma  pt found out 5/26 and now with decr movement of bilateral LEs.   Patient continues to demonstrate the following deficits: muscle weakness and muscle paralysis, decreased cardiorespiratoy endurance, impaired timing and sequencing, unbalanced muscle activation, and acute pain, some new confusion, and decreased sitting balance, decreased standing balance, and decreased balance strategies and therefore will continue to benefit from skilled OT intervention to enhance overall performance with BADL and Reduce care partner burden.  Patient not progressing toward long term goals.  See goal revision..  Continue plan of care.  OT Short Term Goals Week 1:  OT Short Term Goal 1 (Week 1): Patient will complete toilet transfer with max A of 1 OT Short Term Goal 2 (Week 1): Patient will thread one LE into pant leg using adaptive equipment OT Short Term Goal 3 (Week 1): Patient will maintain dynamic balance at EOB with no more than CGA Week 2:  OT Short Term Goal 1 (Week 2): Patient will complete toilet transfer with max A of 1 OT Short Term Goal 2 (Week 2): Patient will  thread one LE into pant leg using adaptive equipment OT Short Term Goal 3 (Week 2): Patient will maintain dynamic balance at EOB with no more than CGA   Leroy Libman 04/10/2022, 9:58 AM

## 2022-04-10 NOTE — Progress Notes (Signed)
Physical Therapy Session Note  Patient Details  Name: Austin Oliver MRN: [id number] Date of Birth: 03/23/1956  Today's Date: 04/10/2022 PT Individual Time: 0830-0940 and 1100-1140 PT Individual Time Calculation (min): 70 min and 40 min  Short Term Goals: Week 1:  PT Short Term Goal 1 (Week 1): Pt will perform overall bed mobility with MinA. PT Short Term Goal 2 (Week 1): Pt will perform sit<>stand transfers with overall MinA to LRAD. PT Short Term Goal 3 (Week 1): Pt will perform stand pivot transfers with mod A using LRAD. PT Short Term Goal 4 (Week 1): Pt will ambulate at least 20 feet with ModA using LRAD. PT Short Term Goal 5 (Week 1): Pt will propel w/c at least 100 ft with up to [place].  Skilled Therapeutic Interventions/Progress Updates: Pt presented in bed with wife present agreeable to therapy with encouragement. Pt continues to demonstrate limited mobility in LLE this session. Pt instructed throughout session to attempt hip abd/add, quad set, and active assisted heel slides however pt unable to initiate mobility aside from DF/PF. Pt had not received pain meds therefore formal assessment not performed per MD request. Pt noted to be lethargic throughout session and would intermittently fall asleep during activity. Pt was able to perform rolling L/R with mod to maxA. Nsg arrived to provide pain meds to pt.  Due to pt's back pain PTA attempted gentle SKC with and without use of pyhsioball. Pt able to tolerate ball and mobility to LLE however was unable to tolerate with RLE therefore only gentle range provided. PTA discussed extensively with wife current change in mobility over past few days and advised will relay information to MD. Pt left in bed at end of session with bed alarm on, call bell within reach and needs met.   Tx2: Pt presented in bed with wife present agreeable to therapy. Pt still somewhat lethargic but appeared more alert this session. Pt was able to follow commands more  consistently this session however pt still would require increased time after PTA stating requests (?HOH vs delayed processing). Pt agreeable to attempt supine to sit this session. With increased time and modA pt was able to perform supine to sit with max multimodal cues for sequencing. Upon sitting pt demonstrating increased R posteriorlateral lean but was able to correct and maintain with minA. Pt denied any increase in back pain with transfer to sitting. Pt was also able to perform anterior scoot to EOB and x 2 small lateral scoots towards HOB. Pt required minA for sitting balance throughout. Pt was able to perform x 5 AA LAQ on LLE.  Pt then impulsively stating "I need to lay down" and reached for bed rail with PTA assisting BLE. Once in supine pt was able to assist pulling up to [facility]. When PTA inquired why pt felt he needed to lay down pt stating he felt tired and unstedy but also that he felt he had a BM. Pt was able to roll to R with minA AND actively move LLE towards body without cues to allow PTA to check for incontinence (clean brief). Pt returned to supine and PTA providing education on importance of participating in therapy and that working on sitting balance is part of therapy. Pt eventually agreeable to return to sitting. Pt performed supine to sit with modA and use of bed features with pt demonstrating improved sitting balance upon initially sitting (CGA). Pt was able to tolerate sitting ~5 min without increase in back pain and improving to  close supervision. Pt was able to perform x 4 lateral scoots to [facility] this session with CGA for trunk stability. Pt returned to supine with modA. Pt set up and performed SAQ on LLE x 5 with last one requiring AA due to fatigue. Pt noted to be falling asleep by 5th rep. Pt left in bed sleeping with bed alarm on, call bell within reach and wife present.      Therapy Documentation Precautions:  Precautions Precautions: Fall Precaution Comments: RLE hemiplegia, LLE  hemipareisis, Restrictions Weight Bearing Restrictions: No General: PT Amount of Missed Time (min): 20 Minutes PT Missed Treatment Reason: Patient fatigue Vital Signs: Therapy Vitals Temp: 98.5 F (36.9 C) Pulse Rate: 77 Resp: 16 BP: 99/67 Patient Position (if appropriate): Lying Oxygen Therapy SpO2: 92 % O2 Device: Room Air Pain:   Mobility:   Locomotion :    Trunk/Postural Assessment :    Balance:   Exercises:   Other Treatments:      Therapy/Group: Individual Therapy  Mette Southgate 04/10/2022, 3:01 PM

## 2022-04-10 NOTE — Progress Notes (Signed)
PROGRESS NOTE   Subjective/Complaints:  Pt reports cognition somewhat better with reduciton in pain meds- wife agrees that yesterday was not good, but today is better although not resolved.   However, pt still c/o LLE weakness- per wife it's "fading" in strength like the RLE.   [place] couldn't move LLE at all- is able to some today, and also spoke with PT and OT about strength exam as well and the changes- PT noticed LLE weaker yesterday but didn't do formal testing.   No pain meds since 2am- hurting really bad- says cannot sit up for therapy "anymore" with OT- was sitting EOB and needed help to lay down in bed- wife at bedside.   Scared to get MRI because last time hurt back so much, couldn't tolerate it.    ROS:   Pt denies SOB, abd pain, CP, N/V/C/D, and vision changes  Objective:   CT HEAD WO CONTRAST (5MM)  Result Date: 04/09/2022 CLINICAL DATA:  Mental status change.  History of stroke. EXAM: CT HEAD WITHOUT CONTRAST TECHNIQUE: Contiguous axial images were obtained from the base of the skull through the vertex without intravenous contrast. RADIATION DOSE REDUCTION: This exam was performed according to the departmental dose-optimization program which includes automated exposure control, adjustment of the mA and/or kV according to patient size and/or use of iterative reconstruction technique. COMPARISON:  CT head 03/25/2022.  MRI head 03/27/2022 FINDINGS: Brain: Patchy hypodensities in the cerebral white matter bilaterally, left greater than right are stable. No new area of infarct, hemorrhage, mass Vascular: Negative for hyperdense vessel Skull: Negative Sinuses/Orbits: Mild mucosal edema paranasal sinuses. Negative orbit Other: None IMPRESSION: No acute abnormality and no change from recent studies. Bilateral white matter hypodensities left greater than right are stable. Electronically Signed   By: Franchot Gallo M.D.   On:  04/09/2022 17:09    Recent Labs    04/10/22 0618  WBC 3.3*  HGB 9.2*  HCT 28.1*  PLT 144*   Recent Labs    04/10/22 0618  NA 130*  K 4.2  CL 91*  CO2 24  GLUCOSE 105*  BUN 34*  CREATININE 1.24  CALCIUM 8.7*    Intake/Output Summary (Last 24 hours) at 04/10/2022 0840 Last data filed at 04/10/2022 0700 Gross per 24 hour  Intake 240 ml  Output 750 ml  Net -510 ml        Physical Exam: Vital Signs Blood pressure 90/61, pulse 72, temperature 98.6 F (37 C), resp. rate 18, height '5\' 9"'$  (1.753 m), weight 92.1 kg, SpO2 94 %.     General: awake, alert, appropriate, sitting EOB with OT- but leaning forward too much; needed A to get supine in bed; wife at bedside; NAD HENT: conjugate gaze; oropharynx a little dry CV: regular rate; no JVD Pulmonary: CTA B/L; no W/R/R- good air movement GI: soft, NT, ND, (+)BS- spot from liver biopsy healing well Psychiatric: appropriate but irritable about pain Neurological: Ox3 today- slightly delayed in answering but appeared to be from hearing loss more than cognition Skin- no skin breakdown on backside- slight red patch below R nipple- no dressing from liver biopsy spot Neurological:     Mental Status:  He is alert and oriented to person, place, and time.     Comments: Speech clear. Able to answer orientation questions and follow simple commands without difficulty. Unable to lift right lower extremity, LLE 4/5, b/l UE 4/5 with the exception of EE 3/5  LLE- HF and KE 1/5; DF/PF 4/5-  5/25- RLE 1/5 Extremities: trace pedal edema present bilaterally.   Assessment/Plan: 1. Functional deficits which require 3+ hours per day of interdisciplinary therapy in a comprehensive inpatient rehab setting. Physiatrist is providing close team supervision and 24 hour management of active medical problems listed below. Physiatrist and rehab team continue to assess barriers to discharge/monitor patient progress toward functional and medical goals  Care  Tool:  Bathing    Body parts bathed by patient: Right arm, Left arm, Chest, Abdomen, Face   Body parts bathed by helper: Front perineal area, Buttocks, Right upper leg, Left upper leg, Right lower leg, Left lower leg     Bathing assist Assist Level: Maximal Assistance - Patient 24 - 49%     Upper Body Dressing/Undressing Upper body dressing   What is the patient wearing?: Pull over shirt    Upper body assist Assist Level: Minimal Assistance - Patient > 75%    Lower Body Dressing/Undressing Lower body dressing      What is the patient wearing?: Pants     Lower body assist Assist for lower body dressing: Total Assistance - Patient < 25%     Toileting Toileting    Toileting assist Assist for toileting: Total Assistance - Patient < 25%     Transfers Chair/bed transfer  Transfers assist  Chair/bed transfer activity did not occur: Safety/medical concerns  Chair/bed transfer assist level: Minimal Assistance - Patient > 75% (SB transfer)     Locomotion Ambulation   Ambulation assist   Ambulation activity did not occur: Safety/medical concerns          Walk 10 feet activity   Assist  Walk 10 feet activity did not occur: Safety/medical concerns        Walk 50 feet activity   Assist Walk 50 feet with 2 turns activity did not occur: Safety/medical concerns         Walk 150 feet activity   Assist Walk 150 feet activity did not occur: Safety/medical concerns         Walk 10 feet on uneven surface  activity   Assist Walk 10 feet on uneven surfaces activity did not occur: Safety/medical concerns         Wheelchair     Assist Is the patient using a wheelchair?: Yes Type of Wheelchair: Manual Wheelchair activity did not occur: Safety/medical concerns  Wheelchair assist level: Supervision/Verbal cueing Max wheelchair distance: 150'    Wheelchair 50 feet with 2 turns activity    Assist    Wheelchair 50 feet with 2 turns  activity did not occur: Safety/medical concerns   Assist Level: Supervision/Verbal cueing   Wheelchair 150 feet activity     Assist  Wheelchair 150 feet activity did not occur: Safety/medical concerns   Assist Level: Supervision/Verbal cueing   Blood pressure 90/61, pulse 72, temperature 98.6 F (37 C), resp. rate 18, height '5\' 9"'$  (1.753 m), weight 92.1 kg, SpO2 94 %.  Medical Problem List and Plan: 1. 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  Impaired mobility: continue Lovenox             -antiplatelet therapy: DAPT on hold for procedure  5/24- Plavix and ASA restarted today 3. Pain Management:  Tylenol prn.  4. Mood: LCSW to follow for evaluation and support.              -antipsychotic agents: N/A 5. Neuropsych: This patient is capable of making decisions on her own behalf. 6. Skin/Wound Care: Routine pressure relief measures.  7. Fluids/Electrolytes/Nutrition: Encourage fluid intake. Recheck CMET in am.  8. CAD/SSS s/p ICD/PPM: On Zetia and Ranexa--ASA/Plavix on hold for procedure 9. T2DM: Hgb A1c has dropped from 6.8-->5.4 since 11/19/21.             --has been off metformin since last admission.  --continue to  monitor BS ac/hs and [place] SSI for elevated BS.   5/23- CBGs 108-153- off meds- con't SSI 5/24- CBGs 90-113- con't regimen 10: Anemia of chronic disease: Recheck CBC in am.  11. Thrombocytopenia: Has been 121 since 04/30-->115. --off ASA/Plavix --monitor for signs of bleeding.  5/24- will recheck labs in AM  5/25- platelets up to 144k- doing better 12. Pre-renal azotemia: Encourage fluid intake.   5/23- Cr up slightly to 1.31- from average of 1.2- will push fluids-   5/24- will recheck BMP tomorrow AM  5/25- Cr 1.24 and BUN 34- is dry- will need IVFs- but will start after therapy- 75 cc/hour x 24  hours 13. Concerns of metastatic disease: Liver biopsy 04/07/22 for work up  5/23- liver biopsy results pending 14. Urinary retention: Foley d/ced. Continue Flomax/Proscar. Increase Flomax to 0.'8mg'$ . Add bethenacol '5mg'$  TID  5/25- still requiring caths- q6 hours- will con't to push fluids- no changes 15. COPD: Respiratory status stable. Will change Yupelri to MDI per patient request.  16. Right knee buckling: discussed importance of quadricpes strengthening 17. Numbness and tingling of lower extremities: discussed Qutenza as an option outpatient.  18. Post TKA right knee pain: discussed that Qutenza can be tried on knee as well outpatient.  19. Hyperkalemia: given kayexalate today 20. Constipation in setting of neurogenic bowel: give kayexalate today  5/23- has been cleaned out- on bowel program nightly 21. Abdominal pain: still constipation on KUB today despite BM today- will give milk of mag to help clear out.   5/25- resolved for now 22. LLE weakness  5/25- will have PT check strength to compare from admission- if still weaker after pain meds (since pain is affecting HF/KE), then will get thoracic/lumbar MRI- will need contrast with and without based on last MRI- will give IV pain meds for MRI if need be.   I spent a total of  53  minutes on total care today- >50% coordination of care- due to prolonged time in room with pt/wife; as well as talking to nurse, PT and OT    LOS: 6 days A FACE TO FACE EVALUATION WAS PERFORMED  Austin Oliver 04/10/2022, 8:40 AM

## 2022-04-10 NOTE — Plan of Care (Signed)
  Problem: Consults Goal: RH STROKE PATIENT EDUCATION Description: See Patient Education module for education specifics  Outcome: Progressing   Problem: RH BOWEL ELIMINATION Goal: RH STG MANAGE BOWEL WITH ASSISTANCE Description: STG Manage Bowel with Mod Assistance. Outcome: Progressing Goal: RH STG MANAGE BOWEL W/MEDICATION W/ASSISTANCE Description: STG Manage Bowel with Medication with Mod Assistance. Outcome: Progressing   Problem: RH BLADDER ELIMINATION Goal: RH STG MANAGE BLADDER WITH ASSISTANCE Description: STG Manage Bladder With Mod Assistance Outcome: Progressing Goal: RH STG MANAGE BLADDER WITH MEDICATION WITH ASSISTANCE Description: STG Manage Bladder With Medication With Mod Assistance. Outcome: Progressing Goal: RH STG MANAGE BLADDER WITH EQUIPMENT WITH ASSISTANCE Description: STG Manage Bladder With Chronic Foley With Mod Assistance Outcome: Progressing   Problem: RH SKIN INTEGRITY Goal: RH STG MAINTAIN SKIN INTEGRITY WITH ASSISTANCE Description: STG Maintain Skin Integrity With Mod Assistance. Outcome: Progressing Goal: RH STG ABLE TO PERFORM INCISION/WOUND CARE W/ASSISTANCE Description: STG Able To Perform Incision/Wound Care With Mod Assistance. Outcome: Progressing   Problem: RH SAFETY Goal: RH STG ADHERE TO SAFETY PRECAUTIONS W/ASSISTANCE/DEVICE Description: STG Adhere to Safety Precautions With Cues and Reminders. Outcome: Progressing Goal: RH STG DECREASED RISK OF FALL WITH ASSISTANCE Description: STG Decreased Risk of Fall With Mod Assistance. Outcome: Progressing   Problem: RH COGNITION-NURSING Goal: RH STG ANTICIPATES NEEDS/CALLS FOR ASSIST W/ASSIST/CUES Description: STG Anticipates Needs/Calls for Assist With Cues and Reminders. Outcome: Progressing   Problem: RH KNOWLEDGE DEFICIT Goal: RH STG INCREASE KNOWLEDGE OF HYPERTENSION Description: Patient will demonstrate knowledge of HTN medications, dietary management, and BP parameters with  educational materials and handouts provided by staff independently at discharge. Outcome: Progressing Goal: RH STG INCREASE KNOWLEGDE OF HYPERLIPIDEMIA Description: Patient will demonstrate knowledge of HLD medications, dietary recommendations, and lab values with educational materials and handouts provided by staff independently at discharge. Outcome: Progressing Goal: RH STG INCREASE KNOWLEDGE OF STROKE PROPHYLAXIS Description: Patient will demonstrate knowledge of medications used to prevent future strokes with educational materials and handouts provided by staff independently at discharge. Outcome: Progressing   Problem: RH PAIN MANAGEMENT Goal: RH STG PAIN MANAGED AT OR BELOW PT'S PAIN GOAL Description: < 3 on a 0-10 pain scale. Outcome: Progressing

## 2022-04-11 ENCOUNTER — Encounter (HOSPITAL_COMMUNITY): Payer: Self-pay | Admitting: Physical Medicine and Rehabilitation

## 2022-04-11 DIAGNOSIS — C7951 Secondary malignant neoplasm of bone: Secondary | ICD-10-CM

## 2022-04-11 LAB — BASIC METABOLIC PANEL
Anion gap: 14 (ref 5–15)
BUN: 32 mg/dL — ABNORMAL HIGH (ref 8–23)
CO2: 23 mmol/L (ref 22–32)
Calcium: 8.2 mg/dL — ABNORMAL LOW (ref 8.9–10.3)
Chloride: 92 mmol/L — ABNORMAL LOW (ref 98–111)
Creatinine, Ser: 1.32 mg/dL — ABNORMAL HIGH (ref 0.61–1.24)
GFR, Estimated: 59 mL/min — ABNORMAL LOW (ref >60)
Glucose, Bld: 109 mg/dL — ABNORMAL HIGH (ref 70–99)
Potassium: 4.1 mmol/L (ref 3.5–5.1)
Sodium: 129 mmol/L — ABNORMAL LOW (ref 135–145)

## 2022-04-11 LAB — URINE CULTURE: Culture: 40000 — AB

## 2022-04-11 LAB — GLUCOSE, CAPILLARY
Glucose-Capillary: 110 mg/dL — ABNORMAL HIGH (ref 70–99)
Glucose-Capillary: 110 mg/dL — ABNORMAL HIGH (ref 70–99)
Glucose-Capillary: 113 mg/dL — ABNORMAL HIGH (ref 70–99)
Glucose-Capillary: 149 mg/dL — ABNORMAL HIGH (ref 70–99)

## 2022-04-11 MED ORDER — TRAMADOL HCL 50 MG PO TABS
100.0000 mg | ORAL_TABLET | Freq: Three times a day (TID) | ORAL | Status: DC
  Administered 2022-04-11 – 2022-04-15 (×13): 100 mg via ORAL
  Filled 2022-04-11 (×13): qty 2

## 2022-04-11 MED ORDER — MORPHINE SULFATE (PF) 2 MG/ML IV SOLN: 2.0000 mg | INTRAVENOUS | Status: AC

## 2022-04-11 MED ORDER — LORAZEPAM 0.5 MG PO TABS: 0.5000 mg | ORAL_TABLET | ORAL | Status: AC

## 2022-04-11 MED ORDER — FLUTICASONE PROPIONATE 50 MCG/ACT NA SUSP
2.0000 | Freq: Every day | NASAL | Status: DC
  Administered 2022-04-12 – 2022-04-14 (×3): 2 via NASAL
  Filled 2022-04-11: qty 16

## 2022-04-11 MED ORDER — SODIUM CHLORIDE 0.9 % IV SOLN: INTRAVENOUS | Status: AC

## 2022-04-11 MED ORDER — DEXAMETHASONE 4 MG PO TABS
4.0000 mg | ORAL_TABLET | Freq: Three times a day (TID) | ORAL | Status: DC
  Administered 2022-04-11 – 2022-04-15 (×12): 4 mg via ORAL
  Filled 2022-04-11 (×12): qty 1

## 2022-04-11 NOTE — Consult Note (Addendum)
[place]  [phone] [phone]   MEDICAL ONCOLOGY - INITIAL CONSULTATION  Referral MD: Dr. 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  He was admitted to the hospital secondary to difficulty ambulating and right lower extremity weakness.  CT head without contrast negative for CVA.  MRI of the brain showed white matter changes that were progressive compared to a prior MRI performed on 11/20/2021 and favored demyelinating disease over ischemia.  There was also a 5 mm hypoenhancing nodule in the anterior pituitary.  He underwent an MRI of the spine which showed areas of abnormal T1 marrow signal in contrast-enhancement worrisome for metastatic disease or lymphoma.  A CT chest/abdomen/pelvis was performed on 03/29/2022 and the suspected metastatic disease at T4, L3-S1 was not parent on CT.  However, there were numerous hypodensities throughout the liver consistent with metastatic disease, splenomegaly, bilateral adrenal thickening suspicious for adrenal metastases, diffuse lymphadenopathy in the upper abdomen and retroperitoneum consistent with metastatic disease or lymphoma.  The patient has been seen previously by hematology for pancytopenia.  His initial consult was performed on 03/08/2020.  Review of this note indicates that he was having intermittent periods of mild anemia, thrombocytopenia, leukopenia for at least 2 years.  He underwent a bone marrow biopsy on 03/21/2020 which showed no monoclonal B-cell or phenotypically aberrant T-cell population identified, normocellular marrow with erythroid hyperplasia.  Cytogenetics were normal.  The patient is currently admitted to [facility]. the patient's wife is at the bedside.  I also discussed his case with Dr. Dagoberto Ligas.  This morning, the  patient has new symptoms of neuropathy in his lower extremities.  Due to the new findings, an MRI of the thoracic and lumbar spine have been ordered.  He continues to have significant weakness in the right lower extremity.  The patient has some mild confusion this morning and has a difficulty answering questions related to review of systems.  He does report a poor appetite.  He could not specifically tell me if he has lost weight.  Wife notices that he has required a fan recently because he feels "hot" more recently.  Unsure if he truly has night sweats however.  He could not tell me if he is having headaches.  Denies chest pain and shortness of breath.  Denies abdominal pain, nausea, vomiting.  His wife tells me that he has had multiple strokes recently and has really been able to recover and get back to baseline before he has a medical issue.  The patient is married.  He has 2 children.  He previously smoked 1-1/2 packs of cigarettes daily but quit in 2018.  Denies alcohol use.  Family history significant for a father with lung cancer.  Medical oncology was asked see the patient make recommendations regarding his newly diagnosed lymphoma.  Past Medical History:  Diagnosis Date   Cirrhosis of liver ([place])    COPD (chronic obstructive pulmonary disease) ([place])    Coronary artery calcification seen on CAT scan 01/14/2017   Essential hypertension 09/11/2020   GERD (gastroesophageal reflux disease)    History of kidney stones    Hyperlipidemia    Hypothyroidism    OSA on CPAP    Mild with AHI 7.8/hr >>on CPAP   Osteoarthritis    Presence of permanent cardiac pacemaker    Shingles 2012  :  Past Surgical History:  Procedure Laterality Date   CORONARY STENT INTERVENTION N/A 08/24/2020   Procedure: CORONARY STENT INTERVENTION;  Surgeon: [name], MD;  Location: [facility];  Service: Cardiovascular;  Laterality: N/A;   HEEL SPUR SURGERY Left 80's   INTRAVASCULAR PRESSURE WIRE/FFR STUDY  N/A 04/25/2021   Procedure: INTRAVASCULAR PRESSURE WIRE/FFR STUDY;  Surgeon: [name], MD;  Location: [facility];  Service: Cardiovascular;  Laterality: N/A;   INTRAVASCULAR ULTRASOUND/IVUS N/A 08/24/2020   Procedure: Intravascular Ultrasound/IVUS;  Surgeon: [name], MD;  Location: [facility];  Service: Cardiovascular;  Laterality: N/A;   IR US GUIDE BX ASP/DRAIN  04/07/2022   KNEE ARTHROPLASTY Right 10/31/2021   Procedure: COMPUTER ASSISTED TOTAL KNEE ARTHROPLASTY;  Surgeon: Rod Can, MD;  Location: [facility];  Service: Orthopedics;  Laterality: Right;   KNEE SURGERY Bilateral    numerous times   LEFT HEART CATH AND CORONARY ANGIOGRAPHY N/A 08/24/2020   Procedure: LEFT HEART CATH AND CORONARY ANGIOGRAPHY;  Surgeon: [name], MD;  Location: [facility];  Service: Cardiovascular;  Laterality: N/A;   LEFT HEART CATH AND CORONARY ANGIOGRAPHY N/A 04/25/2021   Procedure: LEFT HEART CATH AND CORONARY ANGIOGRAPHY;  Surgeon: [name], MD;  Location: [facility];  Service: Cardiovascular;  Laterality: N/A;   NECK SURGERY  70's   PACEMAKER IMPLANT N/A 12/04/2020   Procedure: PACEMAKER IMPLANT;  Surgeon: [name], MD;  Location: [facility];  Service: Cardiovascular;  Laterality: N/A;   TOTAL HIP ARTHROPLASTY Left 03/09/2017   Procedure: LEFT TOTAL HIP ARTHROPLASTY ANTERIOR APPROACH;  Surgeon: Rod Can, MD;  Location: [facility];  Service: Orthopedics;  Laterality: Left;  Dr. requesting RNFA  :   Current Facility-Administered Medications  Medication Dose Route Frequency Provider Last Rate Last Admin   0.9 %  sodium chloride infusion   Intravenous Continuous Bary Leriche, PA-C 100 mL/hr at 04/11/22 0529 New Bag at 04/11/22 0529   acetaminophen (TYLENOL) tablet 325-650 mg  325-650 mg Oral Q4H PRN Bary Leriche, PA-C   650 mg at 04/08/22 2248   alum & mag hydroxide-simeth (MAALOX/MYLANTA) 200-200-20 MG/5ML suspension 30 mL   30 mL Oral Q4H PRN Bary Leriche, PA-C   30 mL at 04/09/22 2041   aspirin EC tablet 81 mg  81 mg Oral Daily Reome, Earle J, RPH   81 mg at 04/10/22 0859   bethanechol (URECHOLINE) tablet 5 mg  5 mg Oral TID Izora Ribas, MD   5 mg at 04/10/22 2132   bisacodyl (DULCOLAX) suppository 10 mg  10 mg Rectal Daily PRN Love, Pamela S, PA-C       bisacodyl (DULCOLAX) suppository 10 mg  10 mg Rectal QPC supper Love, Pamela S, PA-C   10 mg at 04/10/22 1904   clopidogrel (PLAVIX) tablet 75 mg  75 mg Oral Daily Reome, Earle J, RPH   75 mg at 04/10/22 0900   diphenhydrAMINE (BENADRYL) 12.5 MG/5ML elixir 12.5-25 mg  12.5-25 mg Oral Q6H PRN Love, Pamela S, PA-C       enoxaparin (LOVENOX) injection 40 mg  40 mg Subcutaneous Q24H Reome, Earle J, RPH   40 mg at 04/10/22 0900   ezetimibe (ZETIA) tablet 10 mg  10 mg Oral q AM Love, Pamela S, PA-C   10 mg at 04/11/22 0531   feeding supplement (BOOST / RESOURCE BREEZE) liquid 1 Container  1 Container Oral TID BM Courtney Heys, MD  finasteride (PROSCAR) tablet 5 mg  5 mg Oral QHS Love, Pamela S, PA-C   5 mg at 04/10/22 2132   guaiFENesin-dextromethorphan (ROBITUSSIN DM) 100-10 MG/5ML syrup 5-10 mL  5-10 mL Oral Q6H PRN Love, Pamela S, PA-C       insulin aspart (novoLOG) injection 0-5 Units  0-5 Units Subcutaneous QHS Love, Pamela S, PA-C       insulin aspart (novoLOG) injection 0-9 Units  0-9 Units Subcutaneous TID WC Bary Leriche, PA-C   1 Units at 04/08/22 0740   ipratropium-albuterol (DUONEB) 0.5-2.5 (3) MG/3ML nebulizer solution 3 mL  3 mL Nebulization Q4H PRN Love, Pamela S, PA-C       levothyroxine (SYNTHROID) tablet 112 mcg  112 mcg Oral QAC breakfast Bary Leriche, PA-C   112 mcg at 04/11/22 0531   lidocaine (LIDODERM) 5 % 2 patch  2 patch Transdermal Q24H Lovorn, Jinny Blossom, MD   2 patch at 04/10/22 2133   multivitamin with minerals tablet 1 tablet  1 tablet Oral Daily Bary Leriche, PA-C   1 tablet at 04/10/22 0901   nitroGLYCERIN (NITROSTAT) SL  tablet 0.4 mg  0.4 mg Sublingual Q5 Min x 3 PRN Love, Pamela S, PA-C       oxyCODONE (Oxy IR/ROXICODONE) immediate release tablet 5-10 mg  5-10 mg Oral Q4H PRN Lovorn, Jinny Blossom, MD   5 mg at 04/11/22 0531   pantoprazole (PROTONIX) EC tablet 40 mg  40 mg Oral QPC supper Bary Leriche, PA-C   40 mg at 04/10/22 1904   polyethylene glycol (MIRALAX / GLYCOLAX) packet 17 g  17 g Oral Daily PRN Love, Pamela S, PA-C       prochlorperazine (COMPAZINE) tablet 5-10 mg  5-10 mg Oral Q6H PRN Love, Pamela S, PA-C       Or   prochlorperazine (COMPAZINE) injection 5-10 mg  5-10 mg Intramuscular Q6H PRN Love, Pamela S, PA-C       Or   prochlorperazine (COMPAZINE) suppository 12.5 mg  12.5 mg Rectal Q6H PRN Love, Pamela S, PA-C       ranolazine (RANEXA) 12 hr tablet 1,000 mg  1,000 mg Oral BID Love, Pamela S, PA-C   1,000 mg at 04/10/22 2133   sodium phosphate (FLEET) 7-19 GM/118ML enema 1 enema  1 enema Rectal Once PRN Love, Pamela S, PA-C       tamsulosin (FLOMAX) capsule 0.8 mg  0.8 mg Oral Q supper Raulkar, Clide Deutscher, MD   0.8 mg at 04/10/22 1715   traZODone (DESYREL) tablet 25-50 mg  25-50 mg Oral QHS PRN Love, Pamela S, PA-C       umeclidinium bromide (INCRUSE ELLIPTA) 62.5 MCG/ACT 1 puff  1 puff Inhalation Daily Lovorn, Megan, MD   1 puff at 04/10/22 0816      Allergies  Allergen Reactions   Bee Venom Swelling    Extreme Swelling of site     Cleocin [Clindamycin Hcl] Rash    RASH IN BETWEEN FINGERS   Statins Other (See Comments)    Muscles aches with several statins  :   Family History  Problem Relation Age of Onset   Dementia Mother    Heart Problems Father    COPD Father    Lung cancer Father   :   Social History   Socioeconomic History   Marital status: Married    Spouse name: [name]   Number of children: 2   Years of education: Not on file   Highest education level:  Not on file  Occupational History   Occupation: RETIRED-check printer  Tobacco Use   Smoking status: Former     Packs/day: 2.00    Years: 45.00    Pack years: 90.00    Types: Cigarettes    Quit date: 12/05/2016    Years since quitting: 5.3   Smokeless tobacco: Never  Vaping Use   Vaping Use: Never used  Substance and Sexual Activity   Alcohol use: No   Drug use: No   Sexual activity: Not on file  Other Topics Concern   Not on file  Social History Narrative   Not on file   Social Determinants of Health   Financial Resource Strain: Not on file  Food Insecurity: Not on file  Transportation Needs: Not on file  Physical Activity: Not on file  Stress: Not on file  Social Connections: Not on file  Intimate Partner Violence: Not on file  :  Review of Systems: A comprehensive 14 point review of systems was negative except as noted in the HPI.  Exam: Patient Vitals for the past 24 hrs:  BP Temp Pulse Resp SpO2  04/11/22 0527 (!) 93/55 99.1 F (37.3 C) 72 16 96 %  04/10/22 2132 (!) 119/104 -- 78 -- --  04/10/22 1951 (!) 89/56 98.6 F (37 C) 71 17 100 %  04/10/22 1314 99/67 98.5 F (36.9 C) 77 16 92 %  04/10/22 0816 -- -- 72 18 --    General: Chronically ill-appearing male, no distress. Eyes:  no scleral icterus.   ENT:  There were no oropharyngeal lesions.     Lymphatics:  Negative cervical, supraclavicular, axillary, inguinal adenopathy.   Respiratory: lungs were clear bilaterally without wheezing or crackles.   Cardiovascular:  Regular rate and rhythm, S1/S2, without murmur, rub or gallop.  Trace pedal edema. GI:  abdomen was soft, flat, nontender, nondistended, without organomegaly.   Musculoskeletal: Upper extremity strength 4/5 bilaterally, left lower extremity 3/5, right lower extremity 1/5.  Diminished sensation in the bilateral feet. Skin exam was without echymosis, petichae.   Neuro exam was nonfocal.  The patient is alert and oriented x3.  Poor historian.   Lab Results  Component Value Date   WBC 3.3 (L) 04/10/2022   HGB 9.2 (L) 04/10/2022   HCT 28.1 (L) 04/10/2022    PLT 144 (L) 04/10/2022   GLUCOSE 109 (H) 04/11/2022   CHOL 57 03/26/2022   TRIG 271 (H) 03/26/2022   HDL <10 (L) 03/26/2022   LDLDIRECT 16.9 03/26/2022   LDLCALC NOT CALCULATED 03/26/2022   ALT 27 04/05/2022   AST 38 04/05/2022   NA 129 (L) 04/11/2022   K 4.1 04/11/2022   CL 92 (L) 04/11/2022   CREATININE 1.32 (H) 04/11/2022   BUN 32 (H) 04/11/2022   CO2 23 04/11/2022    CT ANGIO HEAD NECK W WO CM  Result Date: 03/17/2022 CLINICAL DATA:  Neuro deficit, stroke suspected EXAM: CT ANGIOGRAPHY HEAD AND NECK TECHNIQUE: Multidetector CT imaging of the head and neck was performed using the standard protocol during bolus administration of intravenous contrast. Multiplanar CT image reconstructions and MIPs were obtained to evaluate the vascular anatomy. Carotid stenosis measurements (when applicable) are obtained utilizing NASCET criteria, using the distal internal carotid diameter as the denominator. RADIATION DOSE REDUCTION: This exam was performed according to the departmental dose-optimization program which includes automated exposure control, adjustment of the mA and/or kV according to patient size and/or use of iterative reconstruction technique. CONTRAST:  142m OMNIPAQUE IOHEXOL 350  MG/ML SOLN COMPARISON:  CTA 11/20/2021, CT head 03/16/2022. FINDINGS: CT HEAD FINDINGS Brain: No evidence of acute infarction, hemorrhage, cerebral edema, mass, mass effect, or midline shift. No hydrocephalus or extra-axial fluid collection. Hypodensity in the left parietal white matter and splenium of the corpus callosum correlate with infarcts from the 11/20/2021 MRI. Vascular: No hyperdense vessel. Skull: Normal. Negative for fracture or focal lesion. Sinuses/Orbits: Mucosal thickening in the ethmoid air cells and maxillary sinuses. The orbits are unremarkable. Other: The mastoid air cells are well aerated. CTA NECK FINDINGS Aortic arch: Standard branching. Imaged portion shows no evidence of aneurysm or dissection.  No significant stenosis of the major arch vessel origins. Right carotid system: No evidence of dissection, stenosis (50% or greater) or occlusion. Left carotid system: No evidence of dissection, stenosis (50% or greater) or occlusion. Redemonstrated approximately 40% stenosis in the proximal left ICA. Vertebral arteries: Moderate narrowing at the origin of the bilateral vertebral arteries, unchanged. No evidence of dissection or occlusion. Skeleton: Redemonstrated degenerative findings in the cervical spine, with osseous fusion of C5 and C6. No acute osseous abnormality. Other neck: Negative. Upper chest: Focal pulmonary opacity or pleural effusion. Redemonstrated left chest cardiac device with leads extending off the inferior field of view. Review of the MIP images confirms the above findings CTA HEAD FINDINGS Anterior circulation: Both internal carotid arteries are patent to the termini, without significant stenosis. A1 segments patent. Normal anterior communicating artery. Anterior cerebral arteries are patent to their distal aspects, with a hypoplastic right ACA. No M1 stenosis or occlusion. Normal MCA bifurcations. Distal MCA branches perfused and symmetric. Posterior circulation: Vertebral arteries patent to the vertebrobasilar junction without stenosis. Posterior inferior cerebral arteries patent bilaterally. Basilar patent to its distal aspect. Superior cerebellar arteries patent bilaterally. Patent P1 segments. Redemonstrated severe stenosis and near occlusion of the left PCA in the proximal P2 segment (series 11, image 111), with poor perfusion of the left PCA distally. Right PCA branches are perfused to their distal aspects without focal stenosis. The bilateral posterior communicating arteries are not visualized. Venous sinuses: As permitted by contrast timing, patent. Redemonstrated hypoplastic right transverse sinus, with dominant left transverse sinus Anatomic variants: None significant. Review of the  MIP images confirms the above findings IMPRESSION: 1.  No acute intracranial process. 2. No intracranial large vessel occlusion. Unchanged severe stenosis in the proximal left P2 segment with poor perfusion of the remainder of the left PCA. 3. 40% stenosis in the proximal left ICA, unchanged. 4. Moderate stenosis of the origin of the bilateral vertebral arteries, which are otherwise patent. Electronically Signed   By: Merilyn Baba M.D.   On: 03/17/2022 01:46   DG Thoracic Spine 2 View  Result Date: 03/25/2022 CLINICAL DATA:  Back pain EXAM: THORACIC SPINE 2 VIEWS COMPARISON:  None Available. FINDINGS: No recent fracture is seen. Alignment of posterior margins of vertebral bodies is unremarkable. There is fusion of bodies of C5 and C6 vertebrae. Severe degenerative changes are noted in the lower cervical spine at C6-C7 level. Small bony spurs seen in the lower thoracic spine. Paraspinal soft tissues are unremarkable. Pacemaker battery is seen in the left infraclavicular region. IMPRESSION: No recent fracture is seen. Degenerative changes are noted in the lower cervical spine and thoracic spine. Electronically Signed   By: Elmer Picker M.D.   On: 03/25/2022 20:23   DG Lumbar Spine 2-3 Views  Result Date: 03/25/2022 CLINICAL DATA:  Back pain EXAM: LUMBAR SPINE - 3 VIEW COMPARISON:  Radiographs and MRI done  on 11/20/2021 FINDINGS: Last lumbar vertebra is transitional suggesting sacralization of L5 vertebra. No recent fracture is seen. Alignment of posterior margins of vertebral bodies is unremarkable. Degenerative changes are noted in lumbar spine with bony spurs and facet hypertrophy, more severe at L4-L5 level. There is previous left hip arthroplasty. There is 14 mm calcific density overlying the right kidney. IMPRESSION: No recent fracture is seen.  Degenerative changes are noted. There is 14 mm calcific density overlying the right kidney suggesting right renal stone. Electronically Signed   By: Elmer Picker M.D.   On: 03/25/2022 20:21   DG Abd 1 View  Result Date: 04/06/2022 CLINICAL DATA:  Abdominal pain EXAM: ABDOMEN - 1 VIEW COMPARISON:  04/04/2022 FINDINGS: There remains residual contrast within the rectosigmoid. Stool is present throughout the colon. No dilated loops. No acute osseous abnormality. Left hip arthroplasty. IMPRESSION: Persistent evidence of constipation. Electronically Signed   By: Macy Mis M.D.   On: 04/06/2022 13:02   CT HEAD WO CONTRAST (5MM)  Result Date: 04/09/2022 CLINICAL DATA:  Mental status change.  History of stroke. EXAM: CT HEAD WITHOUT CONTRAST TECHNIQUE: Contiguous axial images were obtained from the base of the skull through the vertex without intravenous contrast. RADIATION DOSE REDUCTION: This exam was performed according to the departmental dose-optimization program which includes automated exposure control, adjustment of the mA and/or kV according to patient size and/or use of iterative reconstruction technique. COMPARISON:  CT head 03/25/2022.  MRI head 03/27/2022 FINDINGS: Brain: Patchy hypodensities in the cerebral white matter bilaterally, left greater than right are stable. No new area of infarct, hemorrhage, mass Vascular: Negative for hyperdense vessel Skull: Negative Sinuses/Orbits: Mild mucosal edema paranasal sinuses. Negative orbit Other: None IMPRESSION: No acute abnormality and no change from recent studies. Bilateral white matter hypodensities left greater than right are stable. Electronically Signed   By: Franchot Gallo M.D.   On: 04/09/2022 17:09   CT HEAD WO CONTRAST (5MM)  Result Date: 03/25/2022 CLINICAL DATA:  Neuro deficit, acute, stroke suspected worsening R leg weakness, hx of recurrent strokes EXAM: CT HEAD WITHOUT CONTRAST TECHNIQUE: Contiguous axial images were obtained from the base of the skull through the vertex without intravenous contrast. RADIATION DOSE REDUCTION: This exam was performed according to the departmental  dose-optimization program which includes automated exposure control, adjustment of the mA and/or kV according to patient size and/or use of iterative reconstruction technique. COMPARISON:  MRI head 03/17/2022, CT 03/16/2022 FINDINGS: Brain: No evidence of acute intracranial hemorrhage or extra-axial collection.No evidence of mass lesion/concerning mass effect.The ventricles are normal in size.There is periventricular and subcortical white matter hypoattenuation, nonspecific and similar in distribution to recent MRI. Vascular: No hyperdense vessel. Skull: Negative for skull fracture. Sinuses/Orbits: Mild ethmoid air cell mucosal thickening. Other: None. IMPRESSION: No new acute findings on noncontrast CT. Similar distribution of white matter disease in comparison to recent MRI, nonspecific but suggestive of demyelinating process and/or microvascular ischemic disease. Electronically Signed   By: Maurine Simmering M.D.   On: 03/25/2022 15:54   CT HEAD WO CONTRAST  Result Date: 03/16/2022 CLINICAL DATA:  Neuro deficit, acute, stroke suspected EXAM: CT HEAD WITHOUT CONTRAST TECHNIQUE: Contiguous axial images were obtained from the base of the skull through the vertex without intravenous contrast. RADIATION DOSE REDUCTION: This exam was performed according to the departmental dose-optimization program which includes automated exposure control, adjustment of the mA and/or kV according to patient size and/or use of iterative reconstruction technique. COMPARISON:  11/20/2021 FINDINGS: Brain: There is  no acute intracranial hemorrhage, mass effect, or edema. Gray-white differentiation is preserved. Patchy hypoattenuation in the supratentorial white matter likely reflecting a combination of chronic microvascular ischemic changes and sequelae of infarcts on the prior MRI. A small superimposed acute infarction is difficult to exclude without an interval baseline CT comparison. There is no extra-axial fluid collection. Ventricles  and sulci are within normal limits in size and configuration. Vascular: No hyperdense vessel.There is atherosclerotic calcification at the skull base. Skull: Calvarium is unremarkable. Sinuses/Orbits: No acute finding. Other: None. IMPRESSION: No acute intracranial hemorrhage. No acute infarction. An acute small vessel infarct superimposed on chronic changes is difficult to exclude. Electronically Signed   By: Macy Mis M.D.   On: 03/16/2022 12:22   MR BRAIN WO CONTRAST  Result Date: 03/17/2022 CLINICAL DATA:  Neuro deficit, acute, stroke suspected. EXAM: MRI HEAD WITHOUT CONTRAST TECHNIQUE: Multiplanar, multiecho pulse sequences of the brain and surrounding structures were obtained without intravenous contrast. COMPARISON:  CT studies done yesterday and today.  MRI 11/20/2021. FINDINGS: Brain: There are multiple newly seen foci of abnormal T2 and FLAIR signal and restricted diffusion present within the white matter of the cerebral hemispheres and to a lesser extent the cerebellum. Findings are more consistent with active demyelinating disease rather than ischemic infarctions. Consideration could be given to multiple etiologies of demyelination including multiple sclerosis, toxic demyelination and autoimmune encephalitis. No evidence of hemorrhage, hydrocephalus or extra-axial collection. Vascular: Major vessels at the base of the brain show flow. Skull and upper cervical spine: Negative Sinuses/Orbits: Mucosal inflammatory changes of the maxillary sinuses. Orbits negative. Other: None IMPRESSION: Progression of white matter disease since the study of 11/20/2021 with a pattern suggesting a demyelinating process rather than ischemic infarctions. Consider multiple sclerosis, toxic demyelination syndromes and autoimmune encephalitis. Electronically Signed   By: Nelson Chimes M.D.   On: 03/17/2022 16:04   MR BRAIN W WO CONTRAST  Result Date: 03/27/2022 CLINICAL DATA:  Acute neuro deficit. Rule out stroke or  demyelinating disease. EXAM: MRI HEAD WITHOUT AND WITH CONTRAST TECHNIQUE: Multiplanar, multiecho pulse sequences of the brain and surrounding structures were obtained without and with intravenous contrast. CONTRAST:  54m GADAVIST GADOBUTROL 1 MMOL/ML IV SOLN COMPARISON:  MRI head 03/17/2022, 11/20/2021 FINDINGS: Brain: Bilateral white matter hyperintensities are stable since most recent study but appear progressive since 11/20/2021. Lesions are most prominent in the left frontal and parietal white matter with several lesions perpendicular to the ventricular surface. Deep white matter lesions on the right extending to the right frontal horn also show mild progression since 11/20/2021. Lesion in the right brachium pontis unchanged. Many these lesions show increased signal on diffusion-weighted imaging compatible with T2 shine through. Involvement of the splenium unchanged. T1 shortening with T1 hyperintensity in the left occipital cortex on axial image 32 is unchanged the prior study. Likely an area of chronic hemorrhage. 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 This is not seen on the prior studies as contrast not administered. No compression of the optic chiasm. Vascular: Normal arterial flow voids Skull and upper cervical spine: Negative Sinuses/Orbits: Mucosal edema paranasal sinuses.  Negative orbit Other: None IMPRESSION: White matter changes are similar in extent to the most recent MRI but progressive since 11/20/2021. Favor demyelinating disease over ischemia for most of these lesions 5 mm hypoenhancing nodule in the anterior pituitary. 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 Correlate with pituitary function tests. Electronically Signed   By: CFranchot GalloM.D.   On: 03/27/2022 16:01   MR CERVICAL  SPINE W WO CONTRAST  Result Date: 03/27/2022 CLINICAL DATA:  Cervical myelopathy.  Possible CSF leak. EXAM: MRI CERVICAL SPINE WITHOUT AND WITH CONTRAST TECHNIQUE: Multiplanar and multiecho pulse  sequences of the cervical spine, to include the craniocervical junction and cervicothoracic junction, were obtained without and with intravenous contrast. CONTRAST:  39m GADAVIST GADOBUTROL 1 MMOL/ML IV SOLN COMPARISON:  Radiographs 08/11/2018 FINDINGS: Alignment: Straightening of the normal cervical lordosis Elise in part due to a interbody fusion at C5-6. The vertebral bodies are otherwise normally aligned. Vertebrae: No bone lesions or fractures. Cord: Normal cervical cord signal intensity. No cord lesions or syrinx. At the very bottom of the sagittal images there appears to be some signal abnormality in the thoracic cord at the T3-4 level. This also could be artifact but a thoracic spine MRI may be helpful for further evaluation depending on the clinical situation. Posterior Fossa, vertebral arteries, paraspinal tissues: No significant findings. The visualized brainstem and cerebellum appear unremarkable. No Chiari malformation. Disc levels: C2-3: No significant findings. C3-4: Bilateral facet disease and mild uncinate spurring changes contributing to mild foraminal narrowing. C4-5: Bulging annulus, osteophytic ridging, uncinate spurring and facet disease. There is flattening of the ventral thecal sac and narrowing the ventral CSF space but no significant spinal stenosis. There is moderate left foraminal stenosis. No right-sided foraminal stenosis. C5-6: Interbody fusion changes. Mild posterior osteophytic ridging. No significant spinal or foraminal stenosis. C6-7: Degenerative disc disease with bulging annulus, osteophytic ridging, uncinate spurring and facet disease. This contributes to mild spinal and moderate left foraminal stenosis. C7-T1: No significant findings. IMPRESSION: 1. Interbody fusion changes at CM0-1without complicating features. No spinal or foraminal stenosis. 2. Mild spinal and moderate left foraminal stenosis at C4-5. 3. Mild spinal and moderate left foraminal stenosis at C6-7. 4. Possible  signal abnormality in the thoracic cord at the very bottom of the sagittal images at the T3-4 level. This could be artifact but a thoracic spine MRI may be helpful for further evaluation depending on the clinical situation. Electronically Signed   By: PMarijo SanesM.D.   On: 03/27/2022 16:03   MR THORACIC SPINE W WO CONTRAST  Result Date: 03/28/2022 CLINICAL DATA:  Acute myelopathy. EXAM: MRI THORACIC AND LUMBAR SPINE WITHOUT AND WITH CONTRAST TECHNIQUE: Multiplanar and multiecho pulse sequences of the thoracic and lumbar spine were obtained without and with intravenous contrast. CONTRAST:  9.549mGADAVIST GADOBUTROL 1 MMOL/ML IV SOLN COMPARISON:  MRI cervical spine without contrast. FINDINGS: MRI THORACIC SPINE FINDINGS Alignment:  Normal Vertebrae: Abnormal T1 and T2 signal intensity in the T4 vertebral body along with contrast enhancement. Possible T1 signal abnormality in the T1 vertebral body also. Could not exclude metastatic disease. Patient may metastatic workup. Cord: Cord signal abnormality in the upper thoracic cord extending from the T3-4 disc space to the bottom of T5. Cord is also slightly thickened in this area. I do not see any definite evidence of hemorrhage. No obvious enhancement to suggest a neoplastic process. This could be ischemic change or possibly transverse myelitis. The remainder of the thoracic cord is. Conus medullaris terminates at L1. Paraspinal and other soft tissues: No obvious lung mass or mediastinal/hilar mass or adenopathy. The aorta is grossly normal. In the upper abdomen there appear to be bilateral adrenal gland lesions and a very prominent caudate lobe of the liver which could suggest cirrhosis. Disc levels: No focal thoracic disc protrusions, spinal or foraminal stenosis. MRI LUMBAR SPINE FINDINGS Segmentation: There is partial sacralization of L5 with small disc  space. Alignment:  Normal Vertebrae: Abnormal T1 signal intensity posterior aspect L3 and L4 and and most of  the L5 and S1 vertebral bodies. Contrast enhancement is also demonstrated. Conus medullaris: Extends to the L1 level and appears normal. Paraspinal and other soft tissues: Borderline retroperitoneal lymph nodes. Bilateral adrenal lesions. No aortic aneurysm. Disc levels: No significant disc protrusions, spinal or foraminal stenosis. There is a bulging degenerated annulus at L4-5 along with a shallow central disc protrusion. Moderate multilevel facet disease. IMPRESSION: 1. Abnormal cord signal intensity at the T3 and T4 levels. Findings suggest cord ischemia or other process such as transverse myelitis. No enhancement to suggest neoplastic process. 2. There are several areas of abnormal T1 marrow signal and contrast enhancement worrisome for metastatic disease or lymphoma. Patient may need metastatic workup. There are also bilateral adrenal gland lesions and borderline retroperitoneal lymph nodes. 3. Electronically Signed   By: Marijo Sanes M.D.   On: 03/28/2022 18:13   MR Lumbar Spine W Wo Contrast  Result Date: 03/28/2022 CLINICAL DATA:  Acute myelopathy. EXAM: MRI THORACIC AND LUMBAR SPINE WITHOUT AND WITH CONTRAST TECHNIQUE: Multiplanar and multiecho pulse sequences of the thoracic and lumbar spine were obtained without and with intravenous contrast. CONTRAST:  9.85m GADAVIST GADOBUTROL 1 MMOL/ML IV SOLN COMPARISON:  MRI cervical spine without contrast. FINDINGS: MRI THORACIC SPINE FINDINGS Alignment:  Normal Vertebrae: Abnormal T1 and T2 signal intensity in the T4 vertebral body along with contrast enhancement. Possible T1 signal abnormality in the T1 vertebral body also. Could not exclude metastatic disease. Patient may metastatic workup. Cord: Cord signal abnormality in the upper thoracic cord extending from the T3-4 disc space to the bottom of T5. Cord is also slightly thickened in this area. I do not see any definite evidence of hemorrhage. No obvious enhancement to suggest a neoplastic process. This  could be ischemic change or possibly transverse myelitis. The remainder of the thoracic cord is. Conus medullaris terminates at L1. Paraspinal and other soft tissues: No obvious lung mass or mediastinal/hilar mass or adenopathy. The aorta is grossly normal. In the upper abdomen there appear to be bilateral adrenal gland lesions and a very prominent caudate lobe of the liver which could suggest cirrhosis. Disc levels: No focal thoracic disc protrusions, spinal or foraminal stenosis. MRI LUMBAR SPINE FINDINGS Segmentation: There is partial sacralization of L5 with small disc space. Alignment:  Normal Vertebrae: Abnormal T1 signal intensity posterior aspect L3 and L4 and and most of the L5 and S1 vertebral bodies. Contrast enhancement is also demonstrated. Conus medullaris: Extends to the L1 level and appears normal. Paraspinal and other soft tissues: Borderline retroperitoneal lymph nodes. Bilateral adrenal lesions. No aortic aneurysm. Disc levels: No significant disc protrusions, spinal or foraminal stenosis. There is a bulging degenerated annulus at L4-5 along with a shallow central disc protrusion. Moderate multilevel facet disease. IMPRESSION: 1. Abnormal cord signal intensity at the T3 and T4 levels. Findings suggest cord ischemia or other process such as transverse myelitis. No enhancement to suggest neoplastic process. 2. There are several areas of abnormal T1 marrow signal and contrast enhancement worrisome for metastatic disease or lymphoma. Patient may need metastatic workup. There are also bilateral adrenal gland lesions and borderline retroperitoneal lymph nodes. 3. Electronically Signed   By: PMarijo SanesM.D.   On: 03/28/2022 18:13   CT CHEST ABDOMEN PELVIS W CONTRAST  Result Date: 03/29/2022 CLINICAL DATA:  Possible metastatic disease within the thoracolumbar spine EXAM: CT CHEST, ABDOMEN, AND PELVIS WITH CONTRAST TECHNIQUE: Multidetector  CT imaging of the chest, abdomen and pelvis was performed  following the standard protocol during bolus administration of intravenous contrast. RADIATION DOSE REDUCTION: This exam was performed according to the departmental dose-optimization program which includes automated exposure control, adjustment of the mA and/or kV according to patient size and/or use of iterative reconstruction technique. CONTRAST:  118m OMNIPAQUE IOHEXOL 300 MG/ML  SOLN COMPARISON:  03/27/2022, 03/28/2022, 10/08/2021 FINDINGS: CT CHEST FINDINGS Cardiovascular: The heart and great vessels are unremarkable without pericardial effusion. Dual lead pacer unchanged. No evidence of thoracic aortic aneurysm or dissection. Stable atherosclerosis of the aorta and coronary vasculature. Mediastinum/Nodes: No enlarged mediastinal, hilar, or axillary lymph nodes. Thyroid gland, trachea, and esophagus demonstrate no significant findings. Lungs/Pleura: Bilateral partially calcified less than 4 mm pulmonary nodules are unchanged since prior study consistent with granulomas. No new pulmonary nodules or masses. No acute airspace disease, effusion, or pneumothorax. Central airways are patent. Musculoskeletal: There are no acute displaced fractures. The abnormality of the T4 vertebral body seen on prior thoracic spine MRI is not apparent by CT. No destructive bony lesions. Reconstructed images demonstrate no additional findings. CT ABDOMEN PELVIS FINDINGS Hepatobiliary: There are multiple hypodense lesions seen throughout the liver, consistent with metastatic disease. Index lesion right lobe image 50/3 measures 2.0 x 1.6 cm. No biliary duct dilation. Small gallstone within the gallbladder neck without evidence of acute cholecystitis. Pancreas: Unremarkable. No pancreatic ductal dilatation or surrounding inflammatory changes. Spleen: Spleen is enlarged measuring 15.7 by 15.6 x 5.8 cm. The splenic parenchyma is heterogeneous, with multiple hypodensities measuring up to 1.3 cm reference image 62. Adrenals/Urinary Tract:  Bilateral adrenal thickening, measuring up to 1.3 cm on the right and 1.5 cm on the left, suspicious for metastatic disease given the additional findings. The kidneys enhance normally and symmetrically. Nonobstructing 13 mm calculus mid right kidney. No obstructive uropathy within either kidney. Bladder is only minimally distended, with nonspecific bladder wall thickening. Foley catheter is identified, with small amount of gas in the bladder lumen consistent with catheterization. Stomach/Bowel: No bowel obstruction or ileus. Normal appendix right lower quadrant. No bowel wall thickening or inflammatory change. Vascular/Lymphatic: Multiple enlarged lymph nodes within the upper abdomen and retroperitoneum. 14 mm short axis lymph node within the gastrohepatic ligament image 57/3. Retroperitoneal adenopathy, with index lymph node measuring 13 mm in short axis in the aortocaval region image 80/3. 3.3 cm infrarenal abdominal aortic aneurysm. Moderate atherosclerosis throughout the aorta and its branches. Reproductive: Prostate is unremarkable. Other: No free fluid or free intraperitoneal gas. No abdominal wall hernia. Musculoskeletal: Unremarkable left hip arthroplasty. 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 There are no acute displaced fractures. The abnormalities of the L3 through S1 vertebral body seen on prior MRI are not readily apparent by CT. No destructive bony lesions are identified. Reconstructed images demonstrate no additional findings. IMPRESSION: 1. The suspected metastatic disease within the T4 vertebral body and L3 through S1 vertebral bodies on prior MRI is occult by CT. No destructive bony lesions. 2. Numerous hypodensities throughout the liver consistent with metastatic disease. 3. Splenomegaly, with indeterminate hypodensities consistent with metastases. 4. Bilateral adrenal thickening, suspicious for adrenal metastases. 5. Diffuse lymphadenopathy within  the upper abdomen and retroperitoneum, consistent with metastatic disease or lymphoma. 6. 3.3 cm infrarenal abdominal aortic aneurysm. Recommend follow-up every 3 years. Reference: J Am Coll Radiol 25462;70:350-093 7. No acute intrathoracic process. Bilateral sub 4 mm pulmonary nodules are partially calcified, consistent with benign nodules based on stability and imaging  characteristics. 8. 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 Electronically Signed   By: Randa Ngo M.D.   On: 03/29/2022 19:46   IR US Guide Bx Asp/Drain  Result Date: 04/07/2022 INDICATION: Multiple hepatic lesions EXAM: Ultrasound-guided biopsy of left liver mass MEDICATIONS: None. ANESTHESIA/SEDATION: Moderate (conscious) sedation was employed during this procedure. A total of Versed 1 mg and Fentanyl 50 mcg was administered intravenously by the radiology nurse. Total intra-service moderate Sedation Time: 10 minutes. The patient's level of consciousness and vital signs were monitored continuously by radiology nursing throughout the procedure under my direct supervision. COMPLICATIONS: None immediate. PROCEDURE: Informed written consent was obtained from the patient after a thorough discussion of the procedural risks, benefits and alternatives. All questions were addressed. Maximal Sterile Barrier Technique was utilized including caps, mask, sterile gowns, sterile gloves, sterile drape, hand hygiene and skin antiseptic. A timeout was performed prior to the initiation of the procedure. Patient position supine on the ultrasound table. Epigastric skin prepped and draped in usual sterile fashion. Following local lidocaine administration, 17 gauge introducer needle was advanced into 1 of the left hepatic lobe lesions, and 4- 18 gauge cores were obtained utilizing continuous ultrasound guidance. Gelfoam slurry was administered through the introducer needle at the biopsy site. Samples were sent to pathology in  formalin. Needle removed and hemostasis achieved with 5 minutes of manual compression. Post procedure ultrasound images showed no evidence of significant hemorrhage. IMPRESSION: Ultrasound-guided biopsy of left liver mass as above. Electronically Signed   By: Miachel Roux M.D.   On: 04/07/2022 16:20   DG CHEST PORT 1 VIEW  Result Date: 03/25/2022 CLINICAL DATA:  Right-sided weakness EXAM: PORTABLE CHEST 1 VIEW COMPARISON:  11/20/2021 FINDINGS: Lungs are well expanded, symmetric, and clear. No pneumothorax or pleural effusion. Cardiac size within normal limits. 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 Osseous structures are age-appropriate. No acute bone abnormality. IMPRESSION: No active disease. Electronically Signed   By: Fidela Salisbury M.D.   On: 03/25/2022 19:26   DG Knee Right Port  Result Date: 03/25/2022 CLINICAL DATA:  Fall, history of knee replacement EXAM: PORTABLE RIGHT KNEE - 1-2 VIEW COMPARISON:  Knee radiographs 10/31/2021 FINDINGS: Postsurgical changes reflecting right knee arthroplasty are again seen. There is no evidence of acute fracture or dislocation. There is no evidence of hardware related complication. There is a small suprapatellar effusion. IMPRESSION: Acute fracture or dislocation.  Small effusion. Electronically Signed   By: Valetta Mole M.D.   On: 03/25/2022 16:16   DG Abd Portable 1V  Result Date: 04/04/2022 CLINICAL DATA:  Constipation, initial encounter EXAM: PORTABLE ABDOMEN - 1 VIEW COMPARISON:  12/30/2021 FINDINGS: Previously administered contrast now lies within the left colon. Retained fecal material is seen without obstructive change. No free air is noted. No acute bony abnormality is noted. IMPRESSION: Changes consistent with colonic constipation. Previously administered contrast remains within the colon 6 days later. Electronically Signed   By: Inez Catalina M.D.   On: 04/04/2022 19:04   EEG adult  Result Date:  03/17/2022 Derek Jack, MD     03/17/2022  7:38 AM Routine EEG Report Austin Oliver is a 66 y.o. male with a history of TIAs who is undergoing an EEG to evaluate for seizures. Report: This EEG was acquired with electrodes placed according to the International 10-20 electrode system (including Fp1, Fp2, F3, F4, C3, C4, P3, P4, O1, O2, T3, T4, T5, T6, A1, A2, Fz, Cz, Pz). The  following electrodes were missing or displaced: none. The occipital dominant rhythm was 9-10 Hz. This activity is reactive to stimulation. Drowsiness was manifested by background fragmentation; deeper stages of sleep were not identified. There was no focal slowing. There were no interictal epileptiform discharges. There were no electrographic seizures identified. Photic stimulation and hyperventilation were not performed. Impression and clinical correlation: This EEG was obtained while awake and drowsy and is normal. One episode of right hand shaking was captured which had no clear ictal correlate on EEG. Surface EEG may not pick up small focal motor seizures, therefore if clinical concern for seizures persists consider additional EEG monitoring.   Su Monks, MD [facility] [phone] If 7pm- 7am, please page neurology on call as listed in [place].   VAS [place] LOWER EXTREMITY VENOUS (DVT) (ONLY [facility] & [facility])  Result Date: 03/25/2022  Lower Venous DVT Study Patient Name:  LEKEITH WULF  Date of Exam:   03/25/2022 Medical Rec #: [id number]       Accession #:    [id number] Date of Birth: Jul 31, 1956       Patient Gender: M Patient Age:   67 years Exam Location:  [facility] Procedure:      VAS [place] LOWER EXTREMITY VENOUS (DVT) Referring Phys: DAVID YAO --------------------------------------------------------------------------------  Indications: Right leg weakness and pain.  Comparison Study: 11-21-2021 Prior bilateral lower extremity venous was negative                   for DVT. Performing Technologist: Darlin Coco RDMS, RVT   Examination Guidelines: A complete evaluation includes B-mode imaging, spectral Doppler, color Doppler, and power Doppler as needed of all accessible portions of each vessel. Bilateral testing is considered an integral part of a complete examination. Limited examinations for reoccurring indications may be performed as noted. The reflux portion of the exam is performed with the patient in reverse Trendelenburg.  +---------+---------------+---------+-----------+----------+--------------+ RIGHT    CompressibilityPhasicitySpontaneityPropertiesThrombus Aging +---------+---------------+---------+-----------+----------+--------------+ CFV      Full           Yes      Yes                                 +---------+---------------+---------+-----------+----------+--------------+ SFJ      Full                                                        +---------+---------------+---------+-----------+----------+--------------+ FV Prox  Full                                                        +---------+---------------+---------+-----------+----------+--------------+ FV Mid   Full                                                        +---------+---------------+---------+-----------+----------+--------------+ FV DistalFull                                                        +---------+---------------+---------+-----------+----------+--------------+  PFV      Full                                                        +---------+---------------+---------+-----------+----------+--------------+ POP      Full           Yes      Yes                                 +---------+---------------+---------+-----------+----------+--------------+ PTV      Full                                                        +---------+---------------+---------+-----------+----------+--------------+ PERO     Full                                                         +---------+---------------+---------+-----------+----------+--------------+ Gastroc  Full                                                        +---------+---------------+---------+-----------+----------+--------------+   +----+---------------+---------+-----------+----------+--------------+ LEFTCompressibilityPhasicitySpontaneityPropertiesThrombus Aging +----+---------------+---------+-----------+----------+--------------+ CFV Full           Yes      Yes                                 +----+---------------+---------+-----------+----------+--------------+     Summary: RIGHT: - There is no evidence of deep vein thrombosis in the lower extremity.  - Appearance of mild effusion of right knee, also visualized on x-ray.  LEFT: - No evidence of common femoral vein obstruction.  *See table(s) above for measurements and observations. Electronically signed by Deitra Mayo MD on 03/25/2022 at 6:54:00 PM.    Final      CT ANGIO HEAD NECK W WO CM  Result Date: 03/17/2022 CLINICAL DATA:  Neuro deficit, stroke suspected EXAM: CT ANGIOGRAPHY HEAD AND NECK TECHNIQUE: Multidetector CT imaging of the head and neck was performed using the standard protocol during bolus administration of intravenous contrast. Multiplanar CT image reconstructions and MIPs were obtained to evaluate the vascular anatomy. Carotid stenosis measurements (when applicable) are obtained utilizing NASCET criteria, using the distal internal carotid diameter as the denominator. RADIATION DOSE REDUCTION: This exam was performed according to the departmental dose-optimization program which includes automated exposure control, adjustment of the mA and/or kV according to patient size and/or use of iterative reconstruction technique. CONTRAST:  12m OMNIPAQUE IOHEXOL 350 MG/ML SOLN COMPARISON:  CTA 11/20/2021, CT head 03/16/2022. FINDINGS: CT HEAD FINDINGS Brain: No evidence of acute infarction, hemorrhage, cerebral edema, mass, mass  effect, or midline shift. No hydrocephalus or extra-axial fluid collection. Hypodensity in the left parietal white matter and splenium of the corpus callosum correlate with  infarcts from the 11/20/2021 MRI. Vascular: No hyperdense vessel. Skull: Normal. Negative for fracture or focal lesion. Sinuses/Orbits: Mucosal thickening in the ethmoid air cells and maxillary sinuses. The orbits are unremarkable. Other: The mastoid air cells are well aerated. CTA NECK FINDINGS Aortic arch: Standard branching. Imaged portion shows no evidence of aneurysm or dissection. No significant stenosis of the major arch vessel origins. Right carotid system: No evidence of dissection, stenosis (50% or greater) or occlusion. Left carotid system: No evidence of dissection, stenosis (50% or greater) or occlusion. Redemonstrated approximately 40% stenosis in the proximal left ICA. Vertebral arteries: Moderate narrowing at the origin of the bilateral vertebral arteries, unchanged. No evidence of dissection or occlusion. Skeleton: Redemonstrated degenerative findings in the cervical spine, with osseous fusion of C5 and C6. No acute osseous abnormality. Other neck: Negative. Upper chest: Focal pulmonary opacity or pleural effusion. Redemonstrated left chest cardiac device with leads extending off the inferior field of view. Review of the MIP images confirms the above findings CTA HEAD FINDINGS Anterior circulation: Both internal carotid arteries are patent to the termini, without significant stenosis. A1 segments patent. Normal anterior communicating artery. Anterior cerebral arteries are patent to their distal aspects, with a hypoplastic right ACA. No M1 stenosis or occlusion. Normal MCA bifurcations. Distal MCA branches perfused and symmetric. Posterior circulation: Vertebral arteries patent to the vertebrobasilar junction without stenosis. Posterior inferior cerebral arteries patent bilaterally. Basilar patent to its distal aspect. Superior  cerebellar arteries patent bilaterally. Patent P1 segments. Redemonstrated severe stenosis and near occlusion of the left PCA in the proximal P2 segment (series 11, image 111), with poor perfusion of the left PCA distally. Right PCA branches are perfused to their distal aspects without focal stenosis. The bilateral posterior communicating arteries are not visualized. Venous sinuses: As permitted by contrast timing, patent. Redemonstrated hypoplastic right transverse sinus, with dominant left transverse sinus Anatomic variants: None significant. Review of the MIP images confirms the above findings IMPRESSION: 1.  No acute intracranial process. 2. No intracranial large vessel occlusion. Unchanged severe stenosis in the proximal left P2 segment with poor perfusion of the remainder of the left PCA. 3. 40% stenosis in the proximal left ICA, unchanged. 4. Moderate stenosis of the origin of the bilateral vertebral arteries, which are otherwise patent. Electronically Signed   By: Merilyn Baba M.D.   On: 03/17/2022 01:46   DG Thoracic Spine 2 View  Result Date: 03/25/2022 CLINICAL DATA:  Back pain EXAM: THORACIC SPINE 2 VIEWS COMPARISON:  None Available. FINDINGS: No recent fracture is seen. Alignment of posterior margins of vertebral bodies is unremarkable. There is fusion of bodies of C5 and C6 vertebrae. Severe degenerative changes are noted in the lower cervical spine at C6-C7 level. Small bony spurs seen in the lower thoracic spine. Paraspinal soft tissues are unremarkable. Pacemaker battery is seen in the left infraclavicular region. IMPRESSION: No recent fracture is seen. Degenerative changes are noted in the lower cervical spine and thoracic spine. Electronically Signed   By: Elmer Picker M.D.   On: 03/25/2022 20:23   DG Lumbar Spine 2-3 Views  Result Date: 03/25/2022 CLINICAL DATA:  Back pain EXAM: LUMBAR SPINE - 3 VIEW COMPARISON:  Radiographs and MRI done on 11/20/2021 FINDINGS: Last lumbar vertebra  is transitional suggesting sacralization of L5 vertebra. No recent fracture is seen. Alignment of posterior margins of vertebral bodies is unremarkable. Degenerative changes are noted in lumbar spine with bony spurs and facet hypertrophy, more severe at L4-L5 level. There is previous left  hip arthroplasty. There is 14 mm calcific density overlying the right kidney. IMPRESSION: No recent fracture is seen.  Degenerative changes are noted. There is 14 mm calcific density overlying the right kidney suggesting right renal stone. Electronically Signed   By: Elmer Picker M.D.   On: 03/25/2022 20:21   DG Abd 1 View  Result Date: 04/06/2022 CLINICAL DATA:  Abdominal pain EXAM: ABDOMEN - 1 VIEW COMPARISON:  04/04/2022 FINDINGS: There remains residual contrast within the rectosigmoid. Stool is present throughout the colon. No dilated loops. No acute osseous abnormality. Left hip arthroplasty. IMPRESSION: Persistent evidence of constipation. Electronically Signed   By: Macy Mis M.D.   On: 04/06/2022 13:02   CT HEAD WO CONTRAST (5MM)  Result Date: 04/09/2022 CLINICAL DATA:  Mental status change.  History of stroke. EXAM: CT HEAD WITHOUT CONTRAST TECHNIQUE: Contiguous axial images were obtained from the base of the skull through the vertex without intravenous contrast. RADIATION DOSE REDUCTION: This exam was performed according to the departmental dose-optimization program which includes automated exposure control, adjustment of the mA and/or kV according to patient size and/or use of iterative reconstruction technique. COMPARISON:  CT head 03/25/2022.  MRI head 03/27/2022 FINDINGS: Brain: Patchy hypodensities in the cerebral white matter bilaterally, left greater than right are stable. No new area of infarct, hemorrhage, mass Vascular: Negative for hyperdense vessel Skull: Negative Sinuses/Orbits: Mild mucosal edema paranasal sinuses. Negative orbit Other: None IMPRESSION: No acute abnormality and no change  from recent studies. Bilateral white matter hypodensities left greater than right are stable. Electronically Signed   By: Franchot Gallo M.D.   On: 04/09/2022 17:09   CT HEAD WO CONTRAST (5MM)  Result Date: 03/25/2022 CLINICAL DATA:  Neuro deficit, acute, stroke suspected worsening R leg weakness, hx of recurrent strokes EXAM: CT HEAD WITHOUT CONTRAST TECHNIQUE: Contiguous axial images were obtained from the base of the skull through the vertex without intravenous contrast. RADIATION DOSE REDUCTION: This exam was performed according to the departmental dose-optimization program which includes automated exposure control, adjustment of the mA and/or kV according to patient size and/or use of iterative reconstruction technique. COMPARISON:  MRI head 03/17/2022, CT 03/16/2022 FINDINGS: Brain: No evidence of acute intracranial hemorrhage or extra-axial collection.No evidence of mass lesion/concerning mass effect.The ventricles are normal in size.There is periventricular and subcortical white matter hypoattenuation, nonspecific and similar in distribution to recent MRI. Vascular: No hyperdense vessel. Skull: Negative for skull fracture. Sinuses/Orbits: Mild ethmoid air cell mucosal thickening. Other: None. IMPRESSION: No new acute findings on noncontrast CT. Similar distribution of white matter disease in comparison to recent MRI, nonspecific but suggestive of demyelinating process and/or microvascular ischemic disease. Electronically Signed   By: Maurine Simmering M.D.   On: 03/25/2022 15:54   CT HEAD WO CONTRAST  Result Date: 03/16/2022 CLINICAL DATA:  Neuro deficit, acute, stroke suspected EXAM: CT HEAD WITHOUT CONTRAST TECHNIQUE: Contiguous axial images were obtained from the base of the skull through the vertex without intravenous contrast. RADIATION DOSE REDUCTION: This exam was performed according to the departmental dose-optimization program which includes automated exposure control, adjustment of the mA and/or  kV according to patient size and/or use of iterative reconstruction technique. COMPARISON:  11/20/2021 FINDINGS: Brain: There is no acute intracranial hemorrhage, mass effect, or edema. Gray-white differentiation is preserved. Patchy hypoattenuation in the supratentorial white matter likely reflecting a combination of chronic microvascular ischemic changes and sequelae of infarcts on the prior MRI. A small superimposed acute infarction is difficult to exclude without an interval baseline  CT comparison. There is no extra-axial fluid collection. Ventricles and sulci are within normal limits in size and configuration. Vascular: No hyperdense vessel.There is atherosclerotic calcification at the skull base. Skull: Calvarium is unremarkable. Sinuses/Orbits: No acute finding. Other: None. IMPRESSION: No acute intracranial hemorrhage. No acute infarction. An acute small vessel infarct superimposed on chronic changes is difficult to exclude. Electronically Signed   By: Macy Mis M.D.   On: 03/16/2022 12:22   MR BRAIN WO CONTRAST  Result Date: 03/17/2022 CLINICAL DATA:  Neuro deficit, acute, stroke suspected. EXAM: MRI HEAD WITHOUT CONTRAST TECHNIQUE: Multiplanar, multiecho pulse sequences of the brain and surrounding structures were obtained without intravenous contrast. COMPARISON:  CT studies done yesterday and today.  MRI 11/20/2021. FINDINGS: Brain: There are multiple newly seen foci of abnormal T2 and FLAIR signal and restricted diffusion present within the white matter of the cerebral hemispheres and to a lesser extent the cerebellum. Findings are more consistent with active demyelinating disease rather than ischemic infarctions. Consideration could be given to multiple etiologies of demyelination including multiple sclerosis, toxic demyelination and autoimmune encephalitis. No evidence of hemorrhage, hydrocephalus or extra-axial collection. Vascular: Major vessels at the base of the brain show flow. Skull and  upper cervical spine: Negative Sinuses/Orbits: Mucosal inflammatory changes of the maxillary sinuses. Orbits negative. Other: None IMPRESSION: Progression of white matter disease since the study of 11/20/2021 with a pattern suggesting a demyelinating process rather than ischemic infarctions. Consider multiple sclerosis, toxic demyelination syndromes and autoimmune encephalitis. Electronically Signed   By: Nelson Chimes M.D.   On: 03/17/2022 16:04   MR BRAIN W WO CONTRAST  Result Date: 03/27/2022 CLINICAL DATA:  Acute neuro deficit. Rule out stroke or demyelinating disease. EXAM: MRI HEAD WITHOUT AND WITH CONTRAST TECHNIQUE: Multiplanar, multiecho pulse sequences of the brain and surrounding structures were obtained without and with intravenous contrast. CONTRAST:  33m GADAVIST GADOBUTROL 1 MMOL/ML IV SOLN COMPARISON:  MRI head 03/17/2022, 11/20/2021 FINDINGS: Brain: Bilateral white matter hyperintensities are stable since most recent study but appear progressive since 11/20/2021. Lesions are most prominent in the left frontal and parietal white matter with several lesions perpendicular to the ventricular surface. Deep white matter lesions on the right extending to the right frontal horn also show mild progression since 11/20/2021. Lesion in the right brachium pontis unchanged. Many these lesions show increased signal on diffusion-weighted imaging compatible with T2 shine through. Involvement of the splenium unchanged. T1 shortening with T1 hyperintensity in the left occipital cortex on axial image 32 is unchanged the prior study. Likely an area of chronic hemorrhage. 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 This is not seen on the prior studies as contrast not administered. No compression of the optic chiasm. Vascular: Normal arterial flow voids Skull and upper cervical spine: Negative Sinuses/Orbits: Mucosal edema paranasal sinuses.  Negative orbit Other: None IMPRESSION: White matter  changes are similar in extent to the most recent MRI but progressive since 11/20/2021. Favor demyelinating disease over ischemia for most of these lesions 5 mm hypoenhancing nodule in the anterior pituitary. 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 Correlate with pituitary function tests. Electronically Signed   By: CFranchot GalloM.D.   On: 03/27/2022 16:01   MR CERVICAL SPINE W WO CONTRAST  Result Date: 03/27/2022 CLINICAL DATA:  Cervical myelopathy.  Possible CSF leak. EXAM: MRI CERVICAL SPINE WITHOUT AND WITH CONTRAST TECHNIQUE: Multiplanar and multiecho pulse sequences of the cervical spine, to include the craniocervical junction and cervicothoracic junction, were obtained without and with intravenous  contrast. CONTRAST:  27m GADAVIST GADOBUTROL 1 MMOL/ML IV SOLN COMPARISON:  Radiographs 08/11/2018 FINDINGS: Alignment: Straightening of the normal cervical lordosis Elise in part due to a interbody fusion at C5-6. The vertebral bodies are otherwise normally aligned. Vertebrae: No bone lesions or fractures. Cord: Normal cervical cord signal intensity. No cord lesions or syrinx. At the very bottom of the sagittal images there appears to be some signal abnormality in the thoracic cord at the T3-4 level. This also could be artifact but a thoracic spine MRI may be helpful for further evaluation depending on the clinical situation. Posterior Fossa, vertebral arteries, paraspinal tissues: No significant findings. The visualized brainstem and cerebellum appear unremarkable. No Chiari malformation. Disc levels: C2-3: No significant findings. C3-4: Bilateral facet disease and mild uncinate spurring changes contributing to mild foraminal narrowing. C4-5: Bulging annulus, osteophytic ridging, uncinate spurring and facet disease. There is flattening of the ventral thecal sac and narrowing the ventral CSF space but no significant spinal stenosis. There is moderate left foraminal stenosis. No right-sided foraminal stenosis. C5-6:  Interbody fusion changes. Mild posterior osteophytic ridging. No significant spinal or foraminal stenosis. C6-7: Degenerative disc disease with bulging annulus, osteophytic ridging, uncinate spurring and facet disease. This contributes to mild spinal and moderate left foraminal stenosis. C7-T1: No significant findings. IMPRESSION: 1. Interbody fusion changes at CC3-7without complicating features. No spinal or foraminal stenosis. 2. Mild spinal and moderate left foraminal stenosis at C4-5. 3. Mild spinal and moderate left foraminal stenosis at C6-7. 4. Possible signal abnormality in the thoracic cord at the very bottom of the sagittal images at the T3-4 level. This could be artifact but a thoracic spine MRI may be helpful for further evaluation depending on the clinical situation. Electronically Signed   By: PMarijo SanesM.D.   On: 03/27/2022 16:03   MR THORACIC SPINE W WO CONTRAST  Result Date: 03/28/2022 CLINICAL DATA:  Acute myelopathy. EXAM: MRI THORACIC AND LUMBAR SPINE WITHOUT AND WITH CONTRAST TECHNIQUE: Multiplanar and multiecho pulse sequences of the thoracic and lumbar spine were obtained without and with intravenous contrast. CONTRAST:  9.568mGADAVIST GADOBUTROL 1 MMOL/ML IV SOLN COMPARISON:  MRI cervical spine without contrast. FINDINGS: MRI THORACIC SPINE FINDINGS Alignment:  Normal Vertebrae: Abnormal T1 and T2 signal intensity in the T4 vertebral body along with contrast enhancement. Possible T1 signal abnormality in the T1 vertebral body also. Could not exclude metastatic disease. Patient may metastatic workup. Cord: Cord signal abnormality in the upper thoracic cord extending from the T3-4 disc space to the bottom of T5. Cord is also slightly thickened in this area. I do not see any definite evidence of hemorrhage. No obvious enhancement to suggest a neoplastic process. This could be ischemic change or possibly transverse myelitis. The remainder of the thoracic cord is. Conus medullaris  terminates at L1. Paraspinal and other soft tissues: No obvious lung mass or mediastinal/hilar mass or adenopathy. The aorta is grossly normal. In the upper abdomen there appear to be bilateral adrenal gland lesions and a very prominent caudate lobe of the liver which could suggest cirrhosis. Disc levels: No focal thoracic disc protrusions, spinal or foraminal stenosis. MRI LUMBAR SPINE FINDINGS Segmentation: There is partial sacralization of L5 with small disc space. Alignment:  Normal Vertebrae: Abnormal T1 signal intensity posterior aspect L3 and L4 and and most of the L5 and S1 vertebral bodies. Contrast enhancement is also demonstrated. Conus medullaris: Extends to the L1 level and appears normal. Paraspinal and other soft tissues: Borderline retroperitoneal lymph nodes. Bilateral  adrenal lesions. No aortic aneurysm. Disc levels: No significant disc protrusions, spinal or foraminal stenosis. There is a bulging degenerated annulus at L4-5 along with a shallow central disc protrusion. Moderate multilevel facet disease. IMPRESSION: 1. Abnormal cord signal intensity at the T3 and T4 levels. Findings suggest cord ischemia or other process such as transverse myelitis. No enhancement to suggest neoplastic process. 2. There are several areas of abnormal T1 marrow signal and contrast enhancement worrisome for metastatic disease or lymphoma. Patient may need metastatic workup. There are also bilateral adrenal gland lesions and borderline retroperitoneal lymph nodes. 3. Electronically Signed   By: Marijo Sanes M.D.   On: 03/28/2022 18:13   MR Lumbar Spine W Wo Contrast  Result Date: 03/28/2022 CLINICAL DATA:  Acute myelopathy. EXAM: MRI THORACIC AND LUMBAR SPINE WITHOUT AND WITH CONTRAST TECHNIQUE: Multiplanar and multiecho pulse sequences of the thoracic and lumbar spine were obtained without and with intravenous contrast. CONTRAST:  9.23m GADAVIST GADOBUTROL 1 MMOL/ML IV SOLN COMPARISON:  MRI cervical spine without  contrast. FINDINGS: MRI THORACIC SPINE FINDINGS Alignment:  Normal Vertebrae: Abnormal T1 and T2 signal intensity in the T4 vertebral body along with contrast enhancement. Possible T1 signal abnormality in the T1 vertebral body also. Could not exclude metastatic disease. Patient may metastatic workup. Cord: Cord signal abnormality in the upper thoracic cord extending from the T3-4 disc space to the bottom of T5. Cord is also slightly thickened in this area. I do not see any definite evidence of hemorrhage. No obvious enhancement to suggest a neoplastic process. This could be ischemic change or possibly transverse myelitis. The remainder of the thoracic cord is. Conus medullaris terminates at L1. Paraspinal and other soft tissues: No obvious lung mass or mediastinal/hilar mass or adenopathy. The aorta is grossly normal. In the upper abdomen there appear to be bilateral adrenal gland lesions and a very prominent caudate lobe of the liver which could suggest cirrhosis. Disc levels: No focal thoracic disc protrusions, spinal or foraminal stenosis. MRI LUMBAR SPINE FINDINGS Segmentation: There is partial sacralization of L5 with small disc space. Alignment:  Normal Vertebrae: Abnormal T1 signal intensity posterior aspect L3 and L4 and and most of the L5 and S1 vertebral bodies. Contrast enhancement is also demonstrated. Conus medullaris: Extends to the L1 level and appears normal. Paraspinal and other soft tissues: Borderline retroperitoneal lymph nodes. Bilateral adrenal lesions. No aortic aneurysm. Disc levels: No significant disc protrusions, spinal or foraminal stenosis. There is a bulging degenerated annulus at L4-5 along with a shallow central disc protrusion. Moderate multilevel facet disease. IMPRESSION: 1. Abnormal cord signal intensity at the T3 and T4 levels. Findings suggest cord ischemia or other process such as transverse myelitis. No enhancement to suggest neoplastic process. 2. There are several areas of  abnormal T1 marrow signal and contrast enhancement worrisome for metastatic disease or lymphoma. Patient may need metastatic workup. There are also bilateral adrenal gland lesions and borderline retroperitoneal lymph nodes. 3. Electronically Signed   By: PMarijo SanesM.D.   On: 03/28/2022 18:13   CT CHEST ABDOMEN PELVIS W CONTRAST  Result Date: 03/29/2022 CLINICAL DATA:  Possible metastatic disease within the thoracolumbar spine EXAM: CT CHEST, ABDOMEN, AND PELVIS WITH CONTRAST TECHNIQUE: Multidetector CT imaging of the chest, abdomen and pelvis was performed following the standard protocol during bolus administration of intravenous contrast. RADIATION DOSE REDUCTION: This exam was performed according to the departmental dose-optimization program which includes automated exposure control, adjustment of the mA and/or kV according to patient size and/or  use of iterative reconstruction technique. CONTRAST:  162m OMNIPAQUE IOHEXOL 300 MG/ML  SOLN COMPARISON:  03/27/2022, 03/28/2022, 10/08/2021 FINDINGS: CT CHEST FINDINGS Cardiovascular: The heart and great vessels are unremarkable without pericardial effusion. Dual lead pacer unchanged. No evidence of thoracic aortic aneurysm or dissection. Stable atherosclerosis of the aorta and coronary vasculature. Mediastinum/Nodes: No enlarged mediastinal, hilar, or axillary lymph nodes. Thyroid gland, trachea, and esophagus demonstrate no significant findings. Lungs/Pleura: Bilateral partially calcified less than 4 mm pulmonary nodules are unchanged since prior study consistent with granulomas. No new pulmonary nodules or masses. No acute airspace disease, effusion, or pneumothorax. Central airways are patent. Musculoskeletal: There are no acute displaced fractures. The abnormality of the T4 vertebral body seen on prior thoracic spine MRI is not apparent by CT. No destructive bony lesions. Reconstructed images demonstrate no additional findings. CT ABDOMEN PELVIS FINDINGS  Hepatobiliary: There are multiple hypodense lesions seen throughout the liver, consistent with metastatic disease. Index lesion right lobe image 50/3 measures 2.0 x 1.6 cm. No biliary duct dilation. Small gallstone within the gallbladder neck without evidence of acute cholecystitis. Pancreas: Unremarkable. No pancreatic ductal dilatation or surrounding inflammatory changes. Spleen: Spleen is enlarged measuring 15.7 by 15.6 x 5.8 cm. The splenic parenchyma is heterogeneous, with multiple hypodensities measuring up to 1.3 cm reference image 62. Adrenals/Urinary Tract: Bilateral adrenal thickening, measuring up to 1.3 cm on the right and 1.5 cm on the left, suspicious for metastatic disease given the additional findings. The kidneys enhance normally and symmetrically. Nonobstructing 13 mm calculus mid right kidney. No obstructive uropathy within either kidney. Bladder is only minimally distended, with nonspecific bladder wall thickening. Foley catheter is identified, with small amount of gas in the bladder lumen consistent with catheterization. Stomach/Bowel: No bowel obstruction or ileus. Normal appendix right lower quadrant. No bowel wall thickening or inflammatory change. Vascular/Lymphatic: Multiple enlarged lymph nodes within the upper abdomen and retroperitoneum. 14 mm short axis lymph node within the gastrohepatic ligament image 57/3. Retroperitoneal adenopathy, with index lymph node measuring 13 mm in short axis in the aortocaval region image 80/3. 3.3 cm infrarenal abdominal aortic aneurysm. Moderate atherosclerosis throughout the aorta and its branches. Reproductive: Prostate is unremarkable. Other: No free fluid or free intraperitoneal gas. No abdominal wall hernia. Musculoskeletal: Unremarkable left hip arthroplasty. 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 There are no acute displaced fractures. The abnormalities of the L3 through S1 vertebral body seen on  prior MRI are not readily apparent by CT. No destructive bony lesions are identified. Reconstructed images demonstrate no additional findings. IMPRESSION: 1. The suspected metastatic disease within the T4 vertebral body and L3 through S1 vertebral bodies on prior MRI is occult by CT. No destructive bony lesions. 2. Numerous hypodensities throughout the liver consistent with metastatic disease. 3. Splenomegaly, with indeterminate hypodensities consistent with metastases. 4. Bilateral adrenal thickening, suspicious for adrenal metastases. 5. Diffuse lymphadenopathy within the upper abdomen and retroperitoneum, consistent with metastatic disease or lymphoma. 6. 3.3 cm infrarenal abdominal aortic aneurysm. Recommend follow-up every 3 years. Reference: J Am Coll Radiol 22902;11:[id number] 7. No acute intrathoracic process. Bilateral sub 4 mm pulmonary nodules are partially calcified, consistent with benign nodules based on stability and imaging characteristics. 8. 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 Electronically Signed   By: MRanda NgoM.D.   On: 03/29/2022 19:46   IR [place] Bx Asp/Drain  Result Date: 04/07/2022 INDICATION: Multiple hepatic lesions EXAM: Ultrasound-guided biopsy of left liver mass MEDICATIONS: None. ANESTHESIA/SEDATION: Moderate (  conscious) sedation was employed during this procedure. A total of Versed 1 mg and Fentanyl 50 mcg was administered intravenously by the radiology nurse. Total intra-service moderate Sedation Time: 10 minutes. The patient's level of consciousness and vital signs were monitored continuously by radiology nursing throughout the procedure under my direct supervision. COMPLICATIONS: None immediate. PROCEDURE: Informed written consent was obtained from the patient after a thorough discussion of the procedural risks, benefits and alternatives. All questions were addressed. Maximal Sterile Barrier Technique was utilized including caps, mask,  sterile gowns, sterile gloves, sterile drape, hand hygiene and skin antiseptic. A timeout was performed prior to the initiation of the procedure. Patient position supine on the ultrasound table. Epigastric skin prepped and draped in usual sterile fashion. Following local lidocaine administration, 17 gauge introducer needle was advanced into 1 of the left hepatic lobe lesions, and 4- 18 gauge cores were obtained utilizing continuous ultrasound guidance. Gelfoam slurry was administered through the introducer needle at the biopsy site. Samples were sent to pathology in formalin. Needle removed and hemostasis achieved with 5 minutes of manual compression. Post procedure ultrasound images showed no evidence of significant hemorrhage. IMPRESSION: Ultrasound-guided biopsy of left liver mass as above. Electronically Signed   By: Miachel Roux M.D.   On: 04/07/2022 16:20   DG CHEST PORT 1 VIEW  Result Date: 03/25/2022 CLINICAL DATA:  Right-sided weakness EXAM: PORTABLE CHEST 1 VIEW COMPARISON:  11/20/2021 FINDINGS: Lungs are well expanded, symmetric, and clear. No pneumothorax or pleural effusion. Cardiac size within normal limits. 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 Osseous structures are age-appropriate. No acute bone abnormality. IMPRESSION: No active disease. Electronically Signed   By: Fidela Salisbury M.D.   On: 03/25/2022 19:26   DG Knee Right Port  Result Date: 03/25/2022 CLINICAL DATA:  Fall, history of knee replacement EXAM: PORTABLE RIGHT KNEE - 1-2 VIEW COMPARISON:  Knee radiographs 10/31/2021 FINDINGS: Postsurgical changes reflecting right knee arthroplasty are again seen. There is no evidence of acute fracture or dislocation. There is no evidence of hardware related complication. There is a small suprapatellar effusion. IMPRESSION: Acute fracture or dislocation.  Small effusion. Electronically Signed   By: Valetta Mole M.D.   On: 03/25/2022 16:16    DG Abd Portable 1V  Result Date: 04/04/2022 CLINICAL DATA:  Constipation, initial encounter EXAM: PORTABLE ABDOMEN - 1 VIEW COMPARISON:  12/30/2021 FINDINGS: Previously administered contrast now lies within the left colon. Retained fecal material is seen without obstructive change. No free air is noted. No acute bony abnormality is noted. IMPRESSION: Changes consistent with colonic constipation. Previously administered contrast remains within the colon 6 days later. Electronically Signed   By: Inez Catalina M.D.   On: 04/04/2022 19:04   EEG adult  Result Date: 03/17/2022 Derek Jack, MD     03/17/2022  7:38 AM Routine EEG Report ZARYAN YAKUBOV is a 66 y.o. male with a history of TIAs who is undergoing an EEG to evaluate for seizures. Report: This EEG was acquired with electrodes placed according to the International 10-20 electrode system (including Fp1, Fp2, F3, F4, C3, C4, P3, P4, O1, O2, T3, T4, T5, T6, A1, A2, Fz, Cz, Pz). The following electrodes were missing or displaced: none. The occipital dominant rhythm was 9-10 Hz. This activity is reactive to stimulation. Drowsiness was manifested by background fragmentation; deeper stages of sleep were not identified. There was no focal slowing. There were no interictal epileptiform discharges. There were no electrographic seizures identified.  Photic stimulation and hyperventilation were not performed. Impression and clinical correlation: This EEG was obtained while awake and drowsy and is normal. One episode of right hand shaking was captured which had no clear ictal correlate on EEG. Surface EEG may not pick up small focal motor seizures, therefore if clinical concern for seizures persists consider additional EEG monitoring.   Su Monks, MD [facility] [phone] If 7pm- 7am, please page neurology on call as listed in [place].   VAS [place] LOWER EXTREMITY VENOUS (DVT) (ONLY [facility] & [facility])  Result Date: 03/25/2022  Lower Venous DVT Study Patient  Name:  Austin Oliver  Date of Exam:   03/25/2022 Medical Rec #: [id number]       Accession #:    [id number] Date of Birth: December 19, 1955       Patient Gender: M Patient Age:   45 years Exam Location:  [facility] Procedure:      VAS [place] LOWER EXTREMITY VENOUS (DVT) Referring Phys: DAVID YAO --------------------------------------------------------------------------------  Indications: Right leg weakness and pain.  Comparison Study: 11-21-2021 Prior bilateral lower extremity venous was negative                   for DVT. Performing Technologist: Darlin Coco RDMS, RVT  Examination Guidelines: A complete evaluation includes B-mode imaging, spectral Doppler, color Doppler, and power Doppler as needed of all accessible portions of each vessel. Bilateral testing is considered an integral part of a complete examination. Limited examinations for reoccurring indications may be performed as noted. The reflux portion of the exam is performed with the patient in reverse Trendelenburg.  +---------+---------------+---------+-----------+----------+--------------+ RIGHT    CompressibilityPhasicitySpontaneityPropertiesThrombus Aging +---------+---------------+---------+-----------+----------+--------------+ CFV      Full           Yes      Yes                                 +---------+---------------+---------+-----------+----------+--------------+ SFJ      Full                                                        +---------+---------------+---------+-----------+----------+--------------+ FV Prox  Full                                                        +---------+---------------+---------+-----------+----------+--------------+ FV Mid   Full                                                        +---------+---------------+---------+-----------+----------+--------------+ FV DistalFull                                                         +---------+---------------+---------+-----------+----------+--------------+ PFV      Full                                                        +---------+---------------+---------+-----------+----------+--------------+  POP      Full           Yes      Yes                                 +---------+---------------+---------+-----------+----------+--------------+ PTV      Full                                                        +---------+---------------+---------+-----------+----------+--------------+ PERO     Full                                                        +---------+---------------+---------+-----------+----------+--------------+ Gastroc  Full                                                        +---------+---------------+---------+-----------+----------+--------------+   +----+---------------+---------+-----------+----------+--------------+ LEFTCompressibilityPhasicitySpontaneityPropertiesThrombus Aging +----+---------------+---------+-----------+----------+--------------+ CFV Full           Yes      Yes                                 +----+---------------+---------+-----------+----------+--------------+     Summary: RIGHT: - There is no evidence of deep vein thrombosis in the lower extremity.  - Appearance of mild effusion of right knee, also visualized on x-ray.  LEFT: - No evidence of common femoral vein obstruction.  *See table(s) above for measurements and observations. Electronically signed by Deitra Mayo MD on 03/25/2022 at 6:54:00 PM.    Final     Pathology:   SURGICAL PATHOLOGY  CASE: [phone]  PATIENT: Austin Oliver  Surgical Pathology Report   Clinical History: multiple liver lesions (cm)   FINAL MICROSCOPIC DIAGNOSIS:   A. LIVER, LEFT, NEEDLE CORE BIOPSY:  -High grade B-cell lymphoma  -See comment   COMMENT:   The sections show fragments of liver parenchyma displaying multifocal  portal and sinusoidal  infiltrate of primarily large atypical lymphoid  appearing cells displaying vesicular chromatin and small nucleoli  associated with brisk mitosis.  A large battery of immunohistochemical  stains was performed with appropriate controls but some of the stains  display relative exhaustion of the tissue on deeper sectioning and are  generally limited for evaluation.  Nonetheless, the large atypical  lymphoid cells appear positive for CD20, CD79a, PAX5, BCL6, BCL-2, MUM1  in addition to weak cytoplasmic staining for kappa.  No significant  staining is seen with CD10, CD30, CD138, cyclin D1, EBV, cytoplasmic  lambda, cytokeratin AE1/AE3, p40, TTF-1, CDX2, synaptophysin,  cytokeratin 7, cytokeratin 20, prostein, CD34 or CD56.  There is an  admixed T-cell population in the background as seen with CD3 and CD5 and  there is no apparent co-expression of CD5 in B-cell areas.  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  The results were discussed  with  [place] on 04/10/2022   Assessment and Plan:  This is a 66 year old male with newly diagnosed high-grade B-cell lymphoma, CD20 positive.  Features overall consistent with large cell type, ABC subtype.  Tissue has been exhausted and cannot be sent for additional testing.  Discussed the biopsy results as well as CT scan results with the patient and his wife.  We discussed that treatment for lymphoma would consist of systemic chemotherapy.  We discussed proceeding with a PET scan once he is discharged from the hospital to complete his staging work-up.  He has new peripheral neuropathy in his feet as well as loss of sensation today.  Primary team has ordered MRI of the thoracic and lumbar spine.  We will follow-up on results.  Unclear if this is related to his lymphoma diagnosis versus myelitis which was seen on prior MRI.  We will follow-up on results.  Right now, his performance status  would be estimated to be about 3 and would recommend continuation of CIR to help improve performance status.  He will be seen later today by Dr. Julien Nordmann and await further recommendations.  Thank you for this referral.   [name], DNP, AGPCNP-BC, AOCNP   ADDENDUM: Hematology/Oncology Attending: I had a face-to-face encounter with the patient.  I reviewed his record, lab, scan and performed the physical exam and recommended his care plan.  I agree with the above note.  This is a very pleasant 66 years old white male who is known to me and I saw him in the past for evaluation of pancytopenia.  He had extensive work-up at that time including bone marrow biopsy and aspirate that was not revealing of any underlying etiology. The patient was admitted to the hospital recently with difficulty ambulation and weakness in the right lower extremity.  He had work-up for stroke that was unremarkable.  He underwent MRI of the spine that showed marrow signal and contrast enhancement worrisome for metastatic disease or lymphoma.  The patient had CT scan of the chest, abdomen and pelvis that showed suspected metastatic disease at T4, L3-S1 in addition to numerous hypodensities throughout the liver consistent with metastatic disease.  He also has splenomegaly and bilateral adrenal thickening and diffuse lymphadenopathy in the upper abdomen suspicious for metastatic disease or lymphoma.  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 I was consulted to see the patient for evaluation and recommendation regarding treatment of his condition. When seen today he continues to have generalized weakness and fatigue especially in the lower extremities.  He is expected to have repeat MRI of the thoracic and lumbar spines. I had a lengthy discussion with the patient and his wife today about his current condition and  treatment options. Definitely with the diagnosis of diffuse large B cell non-Hodgkin lymphoma, the patient will be a great candidate for systemic chemotherapy which usually result in good response and improvement of his condition. He will need additional studies including PET scan which can be done as outpatient basis. The patient is too weak to go home and I would prefer for him to be transferred to [facility] under the hospitalist service early next week to consider him for systemic chemotherapy and probably palliative radiation early next week after the holiday. For the generalized weakness and fatigue, the patient may benefit from starting steroids with Decadron 4 mg p.o. 3 times daily. The patient and his wife  are in agreement with the current plan. Thank you for taking good care of Mr. Shafer, we will continue to follow-up the patient with you and assist in his management. Disclaimer: This note was dictated with voice recognition software. Similar sounding words can inadvertently be transcribed and may be missed upon review. Eilleen Kempf, MD

## 2022-04-11 NOTE — Progress Notes (Signed)
Occupational Therapy Session Note  Patient Details  Name: Austin Oliver MRN: [id number] Date of Birth: [birth year]  Today's Date: 04/11/2022 OT Individual Time: 3704-8889 OT Individual Time Calculation (min): 46 min    Short Term Goals: Week 2:  OT Short Term Goal 1 (Week 2): Patient will complete toilet transfer with max A of 1 OT Short Term Goal 2 (Week 2): Patient will thread one LE into pant leg using adaptive equipment OT Short Term Goal 3 (Week 2): Patient will maintain dynamic balance at EOB with no more than CGA  Skilled Therapeutic Interventions/Progress Updates: Patient received resting in bed. Agreeable to OT treatment. Family present for treatment. Patient reports pain with all movement. He had difficulty maintaining eye contact/keeping eyes focused when the therapist was speaking. Family reporting the medication making him sleeping the last few days. Patient agreeable to rolling to sit EOB. Patient needed Max/Total assist with supine to sidelying to sitting EOB. Needed Min to Max assist once squared up on the edge of the bed. Patient Able to tolerate sitting approximately 5 minutes x 2 with Max/Total assist each time. Educated patient and wife on care giver support and dme needs for caring for a Max/Total assist transfer day to day. Discussed toileting and patient's previous difficulty tolerating use of lift equipment due to LE pain and pressure against prior TKA's. Patient's wife reports having prior back injury and having to be careful and utilizing a brace at times. Patient and family will benefit from continued OT  working with caregiver training/education and DME interventions to ensure a safe discharge home, as well as progressing patient with self care and transfer skills. Continue OT POC     Therapy Documentation Precautions:  Precautions Precautions: Fall Precaution Comments: RLE hemiplegia, LLE hemipareisis, Restrictions Weight Bearing Restrictions:  No General: General OT Amount of Missed Time: 30 Minutes   Pain:Patient verbalized pain through groaning, unable to describe or quantify.    ADL: ADL Eating: Set up Grooming: Setup, Supervision/safety Upper Body Bathing: Minimal assistance Lower Body Bathing: Dependent Upper Body Dressing: Minimal assistance Lower Body Dressing: Dependent Toileting: Dependent Toilet Transfer: Unable to assess Walk-In Shower Transfer: Unable to assess     Therapy/Group: Individual Therapy  Hermina Barters 04/11/2022, 10:38 AM

## 2022-04-11 NOTE — Progress Notes (Signed)
Occupational Therapy Session Note  Patient Details  Name: Austin Oliver MRN: [id number] Date of Birth: [birth year]  Today's Date: 04/11/2022 OT Individual Time: 1400-1415 OT Individual Time Calculation (min): 15 min    Short Term Goals: Week 1:  OT Short Term Goal 1 (Week 1): Patient will complete toilet transfer with max A of 1 OT Short Term Goal 2 (Week 1): Patient will thread one LE into pant leg using adaptive equipment OT Short Term Goal 3 (Week 1): Patient will maintain dynamic balance at EOB with no more than CGA Week 2:  OT Short Term Goal 1 (Week 2): Patient will complete toilet transfer with max A of 1 OT Short Term Goal 2 (Week 2): Patient will thread one LE into pant leg using adaptive equipment OT Short Term Goal 3 (Week 2): Patient will maintain dynamic balance at EOB with no more than CGA  Skilled Therapeutic Interventions/Progress Updates:    Pt declined out of bd activity as well as getting to the EOB. Discussed laying on his side instead of his back- max A to roll to his left and position on his side.   Discussed with nursing and patient about transitioning to air bed. Came back later and helped transfer him dependently into bed.   Therapy Documentation Precautions:  Precautions Precautions: Fall Precaution Comments: RLE hemiplegia, LLE hemipareisis, Restrictions Weight Bearing Restrictions: No General: General OT Amount of Missed Time: 45 Minutes PT Missed Treatment Reason: Unavailable (Comment) (oncology consult)  Pain: Ongoing pain in bilateral Les and in lower back Therapy/Group: Individual Therapy  Willeen Cass [facility] 04/11/2022, 4:32 PM

## 2022-04-11 NOTE — Progress Notes (Signed)
Pt continues to be disoriented, unable to follow simple commands, unable to identify parts of the body that is in pain. Each time pain is assessed pt gives a different answer. Pt also demonstrated signs of bilateral lower extremity weakness; but pt was also able to turn entire body to one side with out staff assistance. Pt refused to be repositioned per order. Pt also appears to be very agitated with staff and lab. Provider will be made aware of findings.

## 2022-04-11 NOTE — Progress Notes (Signed)
Physical Therapy Session Note  Patient Details  Name: Austin Oliver MRN: [id number] Date of Birth: November 08, 1956  Today's Date: 04/11/2022 PT Individual Time: 0822-0900 PT Individual Time Calculation (min): 38 min   Short Term Goals: Week 1:  PT Short Term Goal 1 (Week 1): Pt will perform overall bed mobility with MinA. PT Short Term Goal 2 (Week 1): Pt will perform sit<>stand transfers with overall MinA to LRAD. PT Short Term Goal 3 (Week 1): Pt will perform stand pivot transfers with mod A using LRAD. PT Short Term Goal 4 (Week 1): Pt will ambulate at least 20 feet with ModA using LRAD. PT Short Term Goal 5 (Week 1): Pt will propel w/c at least 100 ft with up to [place].  Skilled Therapeutic Interventions/Progress Updates:    Oncology consult present at start of session, pt missed 22 min of scheduled therapy. pt received in bed and agreeable to therapy. Supine>nearly full sit with mod A x 2 but pt reports back pain and returned to sidelying. With encouragement, pt willing to come back to sitting with min A +2 and constant support from rehab tech. slideboard transfer with mod-max A x 2. Pt able to scoot, but limited by fear and difficulty following instructions, as well as pain/panic setting in mid way through transfer. Once seated in chair, pt mildly agitated but feeling better. Pt propelled w/c with BUE and max VC x 80 ft to nurses station and performed 180 turn back toward room before being transported back to room. Pt mildly agitated toward end of session d/t frustration with task. Pt remained in w/c at end of session and remained with his wife present.   Therapy Documentation Precautions:  Precautions Precautions: Fall Precaution Comments: RLE hemiplegia, LLE hemipareisis, Restrictions Weight Bearing Restrictions: No General: PT Amount of Missed Time (min): 22 Minutes PT Missed Treatment Reason: Unavailable (Comment) (oncology consult)    Therapy/Group: Individual Therapy  Mickel Fuchs 04/11/2022, 12:43 PM

## 2022-04-11 NOTE — Progress Notes (Signed)
PROGRESS NOTE   Subjective/Complaints:  Pt report cognition back to baseline, however per wife, still not answering questions correctly all the time and unclear if due to hearing loss.  Also c/o everything being numb from legs on down- like fell asleep, but not dead.   On IVFs 100cc/hour- Na down to 129 and BUN still elevated at 32 Discussed trying to get pain better controlled with out making pt more confused- will start tramadol 100 mg q8h around the clock and see how that helps.  Also having gravely/hoarse voice- does have allergies.   Also told late in afternoon pt having new onset penile/scrotal edema- will assess in AM  ROS:   Pt denies SOB, abd pain, CP, N/V/C/D, and vision changes  Objective:   No results found.  Recent Labs    04/10/22 0618  WBC 3.3*  HGB 9.2*  HCT 28.1*  PLT 144*   Recent Labs    04/10/22 0618 04/11/22 0536  NA 130* 129*  K 4.2 4.1  CL 91* 92*  CO2 24 23  GLUCOSE 105* 109*  BUN 34* 32*  CREATININE 1.24 1.32*  CALCIUM 8.7* 8.2*    Intake/Output Summary (Last 24 hours) at 04/11/2022 1933 Last data filed at 04/11/2022 1909 Gross per 24 hour  Intake 1013.33 ml  Output 1700 ml  Net -686.67 ml        Physical Exam: Vital Signs Blood pressure (!) 87/58, pulse 70, temperature 98.4 F (36.9 C), resp. rate 16, height '5\' 9"'$  (1.753 m), weight 92.1 kg, SpO2 94 %.      General: awake, alert, appropriate, NAD HENT: conjugate gaze; oropharynx moist CV: regular rate; no JVD Pulmonary: CTA B/L; no W/R/R- good air movement GI: soft, NT, ND, (+)BS Psychiatric: appropriate Neurological: Ox2- slowed/delayed and sometimes incorrect responses; but more awake Significantly decreased sensation from L1- S2 B/L- and also pinprick decreased L DF 3-/5 and PF 3-/5; HF/KE 1/5- but having pain Skin- no skin breakdown on backside- slight red patch below R nipple- no dressing from liver biopsy  spot Neurological:     Mental Status: He is alert and oriented to person, place, and time.     Comments: Speech clear. Able to answer orientation questions and follow simple commands without difficulty. Unable to lift right lower extremity, LLE 4/5, b/l UE 4/5 with the exception of EE 3/5  LLE- HF and KE 1/5; DF/PF 4/5-  5/25- RLE 1/5 Extremities: trace pedal edema present bilaterally.   Assessment/Plan: 1. Functional deficits which require 3+ hours per day of interdisciplinary therapy in a comprehensive inpatient rehab setting. Physiatrist is providing close team supervision and 24 hour management of active medical problems listed below. Physiatrist and rehab team continue to assess barriers to discharge/monitor patient progress toward functional and medical goals  Care Tool:  Bathing    Body parts bathed by patient: Right arm, Left arm, Chest, Abdomen, Face   Body parts bathed by helper: Front perineal area, Buttocks, Right upper leg, Left upper leg, Right lower leg, Left lower leg     Bathing assist Assist Level: Maximal Assistance - Patient 24 - 49%     Upper Body Dressing/Undressing Upper body dressing  What is the patient wearing?: Pull over shirt    Upper body assist Assist Level: Minimal Assistance - Patient > 75%    Lower Body Dressing/Undressing Lower body dressing      What is the patient wearing?: Pants     Lower body assist Assist for lower body dressing: Total Assistance - Patient < 25%     Toileting Toileting    Toileting assist Assist for toileting: Total Assistance - Patient < 25%     Transfers Chair/bed transfer  Transfers assist  Chair/bed transfer activity did not occur: Safety/medical concerns  Chair/bed transfer assist level: Minimal Assistance - Patient > 75% (SB transfer)     Locomotion Ambulation   Ambulation assist   Ambulation activity did not occur: Safety/medical concerns          Walk 10 feet activity   Assist  Walk  10 feet activity did not occur: Safety/medical concerns        Walk 50 feet activity   Assist Walk 50 feet with 2 turns activity did not occur: Safety/medical concerns         Walk 150 feet activity   Assist Walk 150 feet activity did not occur: Safety/medical concerns         Walk 10 feet on uneven surface  activity   Assist Walk 10 feet on uneven surfaces activity did not occur: Safety/medical concerns         Wheelchair     Assist Is the patient using a wheelchair?: Yes Type of Wheelchair: Manual Wheelchair activity did not occur: Safety/medical concerns  Wheelchair assist level: Supervision/Verbal cueing Max wheelchair distance: 150'    Wheelchair 50 feet with 2 turns activity    Assist    Wheelchair 50 feet with 2 turns activity did not occur: Safety/medical concerns   Assist Level: Supervision/Verbal cueing   Wheelchair 150 feet activity     Assist  Wheelchair 150 feet activity did not occur: Safety/medical concerns   Assist Level: Supervision/Verbal cueing   Blood pressure (!) 87/58, pulse 70, temperature 98.4 F (36.9 C), resp. rate 16, height '5\' 9"'$  (1.753 m), weight 92.1 kg, SpO2 94 %.  Medical Problem List and Plan: 1. 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  2.  Impaired mobility: continue Lovenox             -antiplatelet therapy: DAPT on hold for procedure  5/24- Plavix and ASA restarted today 3. Pain Management:  Tylenol prn.   5/26- on Oxycodone 5-10 mg q4 hours- will add tramadol 100 mg q8 hours for pain control and to try and reduce confusion.  4. Mood: LCSW to follow for evaluation and support.              -antipsychotic agents: N/A 5. Neuropsych: This patient is  capable of making decisions on her own behalf. Very HOH and not quitehimself cognitively.  6. Skin/Wound Care: Routine pressure relief measures.  7. Fluids/Electrolytes/Nutrition: Encourage fluid intake. Recheck CMET in am.  8. CAD/SSS s/p ICD/PPM: On Zetia and Ranexa--ASA/Plavix on hold for procedure 9. T2DM: Hgb A1c has dropped from 6.8-->5.4 since 11/19/21.             --  has been off metformin since last admission.  --continue to  monitor BS ac/hs and [place] SSI for elevated BS.   5/26- CBG's controlled- con't regimen 10: Anemia of chronic disease: Recheck CBC in am.  11. Thrombocytopenia: Has been 121 since 04/30-->115. --off ASA/Plavix --monitor for signs of bleeding.  5/24- will recheck labs in AM  5/25- platelets up to 144k- doing better 12. Pre-renal azotemia: Encourage fluid intake.   5/23- Cr up slightly to 1.31- from average of 1.2- will push fluids-   5/24- will recheck BMP tomorrow AM  5/25- Cr 1.24 and BUN 34- is dry- will need IVFs- but will start after therapy- 75 cc/hour x 24 hours  5/26- will con't IVFs 100cc hour another 24 hours since Cr up to 1.32 and BUN 32 13. 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 Urinary retention: Foley d/ced. Continue Flomax/Proscar. Increase Flomax to 0.'8mg'$ . Add bethenacol '5mg'$  TID  5/25- still requiring caths- q6 hours- will con't to push fluids- no changes 15. COPD: Respiratory status stable. Will change Yupelri to MDI per patient request.  16. Right knee buckling: discussed importance of quadricpes strengthening 17. Numbness and tingling of lower extremities: discussed Qutenza as an option outpatient.  18. Post TKA right knee pain: discussed that Qutenza can be tried on knee as well outpatient.  19. Hyperkalemia: given kayexalate today 20. Constipation in setting of neurogenic bowel: give kayexalate today  5/23- has  been cleaned out- on bowel program nightly 21. Abdominal pain: still constipation on KUB today despite BM today- will give milk of mag to help clear out.   5/25- resolved for now 22. LLE weakness  5/25- will have PT check strength to compare from admission- if still weaker after pain meds (since pain is affecting HF/KE), then will get thoracic/lumbar MRI- will need contrast with and without based on last MRI- will give IV pain meds for MRI if need be.   5/26- will get thoracic and lumbar MRIs- still pending- giving Ativan and Morphine IV for MRI's.  23. Hyponatremia  5/26- will recheck in Am after IVFs- encouraged pt to drink some more, but not overdrink due to low Na.    I spent a total of 57   minutes on total care today- >50% coordination of care- due to d/w Onc NP; prolonged d/w pt/wife about new B cell lymphoma dx; and d/w pt and PA about MRIs that I ordered  LOS: 7 days A FACE TO FACE EVALUATION WAS PERFORMED  Dafina Suk 04/11/2022, 7:33 PM

## 2022-04-12 ENCOUNTER — Inpatient Hospital Stay (HOSPITAL_COMMUNITY): Payer: Medicare Other

## 2022-04-12 LAB — BASIC METABOLIC PANEL
Anion gap: 14 (ref 5–15)
BUN: 30 mg/dL — ABNORMAL HIGH (ref 8–23)
CO2: 23 mmol/L (ref 22–32)
Calcium: 8.6 mg/dL — ABNORMAL LOW (ref 8.9–10.3)
Chloride: 95 mmol/L — ABNORMAL LOW (ref 98–111)
Creatinine, Ser: 1.05 mg/dL (ref 0.61–1.24)
GFR, Estimated: >60 mL/min (ref >60)
Glucose, Bld: 164 mg/dL — ABNORMAL HIGH (ref 70–99)
Potassium: 4.6 mmol/L (ref 3.5–5.1)
Sodium: 132 mmol/L — ABNORMAL LOW (ref 135–145)

## 2022-04-12 LAB — GLUCOSE, CAPILLARY
Glucose-Capillary: 139 mg/dL — ABNORMAL HIGH (ref 70–99)
Glucose-Capillary: 182 mg/dL — ABNORMAL HIGH (ref 70–99)
Glucose-Capillary: 230 mg/dL — ABNORMAL HIGH (ref 70–99)

## 2022-04-12 MED ORDER — LORAZEPAM 2 MG/ML IJ SOLN: 0.5000 mg | INTRAMUSCULAR | Status: AC

## 2022-04-12 MED ORDER — MEGESTROL ACETATE 400 MG/10ML PO SUSP
800.0000 mg | Freq: Every day | ORAL | Status: DC
Start: 2022-04-12 — End: 2022-04-15
  Administered 2022-04-12 – 2022-04-14 (×3): 800 mg via ORAL
  Filled 2022-04-12 (×4): qty 20

## 2022-04-12 MED ORDER — GADOBUTROL 1 MMOL/ML IV SOLN
9.2000 mL | Freq: Once | INTRAVENOUS | Status: AC | PRN
  Administered 2022-04-12: 9.2 mL via INTRAVENOUS

## 2022-04-12 MED ORDER — PANTOPRAZOLE SODIUM 40 MG PO TBEC: 40.0000 mg | DELAYED_RELEASE_TABLET | Freq: Every day | ORAL | Status: DC

## 2022-04-12 MED ORDER — LORAZEPAM 0.5 MG PO TABS: 0.5000 mg | ORAL_TABLET | ORAL | Status: DC

## 2022-04-12 MED ORDER — MORPHINE SULFATE (PF) 2 MG/ML IV SOLN
2.0000 mg | INTRAVENOUS | Status: AC
  Administered 2022-04-12: 2 mg via INTRAVENOUS
  Filled 2022-04-12: qty 1

## 2022-04-12 MED ORDER — STERILE WATER FOR INJECTION IJ SOLN
INTRAMUSCULAR | Status: AC
  Filled 2022-04-12: qty 10

## 2022-04-12 MED ORDER — PANTOPRAZOLE SODIUM 40 MG IV SOLR
40.0000 mg | INTRAVENOUS | Status: DC
  Administered 2022-04-12: 40 mg via INTRAVENOUS
  Filled 2022-04-12: qty 10

## 2022-04-12 MED ORDER — PANTOPRAZOLE SODIUM 40 MG PO TBEC
40.0000 mg | DELAYED_RELEASE_TABLET | Freq: Once | ORAL | Status: AC
  Administered 2022-04-12: 40 mg via ORAL
  Filled 2022-04-12: qty 1

## 2022-04-12 NOTE — Progress Notes (Signed)
Pt sitting up in bed, eating what looks like chicken pot pie with wide and son at bedside. Pt is alert, smiling, stated that MRI went well, even though they had to restart half way through due to technical issues. Pt needed to be I&O cath, pt requested to be cathed at a later time due to family in the room and wanting to eat. Nurse bladder scanned patient with BS 345 mL. Pt in good spirits at this time, needs mets at this time, call light within reach.

## 2022-04-12 NOTE — Progress Notes (Signed)
Occupational Therapy Session Note  Patient Details  Name: Austin Oliver MRN: [id number] Date of Birth: 1956/09/30  Today's Date: 04/12/2022 OT Individual Time: 5681-2751 OT Individual Time Calculation (min): 60 min    Short Term Goals: Week 2:  OT Short Term Goal 1 (Week 2): Patient will complete toilet transfer with max A of 1 OT Short Term Goal 2 (Week 2): Patient will thread one LE into pant leg using adaptive equipment OT Short Term Goal 3 (Week 2): Patient will maintain dynamic balance at EOB with no more than CGA  Skilled Therapeutic Interventions/Progress Updates:    Pt completed bathing and dressing from bed level this session.  Supervision for UB bathing with HOB elevated and min instructional cueing for sequencing.  He needed max assist for LB bathing sitting with HOB up to flat for rolling.  Bed mobility at mod assist level to roll to both sides.  Mod assist for donning pullover shirt as well with pt bringing it over his head and arms but them needing assist to pull it down in the back.  Total assist for all LB dressing to donn new brief and shorts.  Pt with vomiting at end of session after taking his meds, without the report of nausea.  Nursing made aware.  Pt left in bed secondary to having x-ray soon as well as possibility of MRI.  Discussed with pt and spouse that he can be helped up later today or in the afternoon with the lift.  Pt with call button and phone in reach.    Therapy Documentation Precautions:  Precautions Precautions: Fall Precaution Comments: RLE hemiplegia, LLE hemipareisis, Restrictions Weight Bearing Restrictions: No  Pain: Pain Assessment Pain Scale: 0-10 Pain Score: 4  Pain Intervention(s): Medication (See eMAR) ADL: See Care Tool Section for some details of mobility and selfcare   Therapy/Group: Individual Therapy  Reynol Arnone OTR/L 04/12/2022, 12:21 PM

## 2022-04-12 NOTE — Progress Notes (Signed)
PROGRESS NOTE   Subjective/Complaints:  Pt reports nauseated- and has poor appetite- not eating much.  Vomited this AM- said was due to burping Will change protonix to 40 mg IV daily for now  Back pain better with tramadol per pt Per Oncology, to be transferred early next week to [facility] for Chemo- they also started Decadron 4 mg q8 hours.   Also having penile/scrotal swelling, but a little better.  Is having nightly BM's ,but doesn't know how much.,  Due to nausea/vomiting, had KUB done- showed nonobstructive pattern- not full of stool per report and my independent review.    ROS:   Pt denies SOB, abd pain, CP, (+) N/V/C/D, and vision changes  Objective:   DG Abd 1 View  Result Date: 04/12/2022 CLINICAL DATA:  Nausea and vomiting. EXAM: ABDOMEN - 1 VIEW COMPARISON:  04/06/2022 FINDINGS: Bowel gas pattern appears nonobstructed. No dilated loops of small bowel identified. Right renal stone is again noted within the expected location of the inferior pole measuring 1.1 cm. Status post left hip arthroplasty. IMPRESSION: 1. Nonobstructive bowel gas pattern. 2. Right renal stone. Electronically Signed   By: Kerby Moors M.D.   On: 04/12/2022 11:23    Recent Labs    04/10/22 0618  WBC 3.3*  HGB 9.2*  HCT 28.1*  PLT 144*   Recent Labs    04/11/22 0536 04/12/22 0539  NA 129* 132*  K 4.1 4.6  CL 92* 95*  CO2 23 23  GLUCOSE 109* 164*  BUN 32* 30*  CREATININE 1.32* 1.05  CALCIUM 8.2* 8.6*    Intake/Output Summary (Last 24 hours) at 04/12/2022 1234 Last data filed at 04/12/2022 0803 Gross per 24 hour  Intake 1484.47 ml  Output 1600 ml  Net -115.53 ml        Physical Exam: Vital Signs Blood pressure 117/71, pulse 64, temperature (!) 97.4 F (36.3 C), temperature source Oral, resp. rate 18, height '5\' 9"'$  (1.753 m), weight 92.1 kg, SpO2 94 %.       General: awake, alert, appropriate, laying in new air  mattress/bed; wife at bedside; NAD HENT: conjugate gaze; oropharynx moist CV: regular rate; no JVD Pulmonary: CTA B/L; no W/R/R- good air movement GI: soft, NT; distended vs mildly protuberant; slightly hypoactive BS Psychiatric: appropriate- laughing and telling jokes some- deadpan humor Neurological: strength is basically same as yesterday as well as sensation Significantly decreased sensation from L1- S2 B/L- and also pinprick decreased L DF 3-/5 and PF 3-/5; HF/KE 1/5- but having pain Skin- no skin breakdown on backside- slight red patch below R nipple- no dressing from liver biopsy spot Neurological:     Mental Status: He is alert and oriented to person, place, and time.     Comments: Speech clear. Able to answer orientation questions and follow simple commands without difficulty. Unable to lift right lower extremity, LLE 4/5, b/l UE 4/5 with the exception of EE 3/5  LLE- HF and KE 1/5; DF/PF 4/5-  5/25- RLE 1/5 Extremities: trace pedal edema present bilaterally.  GU: moderate scrotal edema and mild to moderate penile edema   Assessment/Plan: 1. Functional deficits which require 3+ hours per day of  interdisciplinary therapy in a comprehensive inpatient rehab setting. Physiatrist is providing close team supervision and 24 hour management of active medical problems listed below. Physiatrist and rehab team continue to assess barriers to discharge/monitor patient progress toward functional and medical goals  Care Tool:  Bathing    Body parts bathed by patient: Right arm, Left arm, Chest, Abdomen, Face, Front perineal area, Right upper leg, Left upper leg   Body parts bathed by helper: Buttocks, Left lower leg, Right lower leg     Bathing assist Assist Level: Moderate Assistance - Patient 50 - 74% (supine to upright with HOB)     Upper Body Dressing/Undressing Upper body dressing   What is the patient wearing?: Pull over shirt    Upper body assist Assist Level: Moderate  Assistance - Patient 50 - 74% (supine HOB elevated)    Lower Body Dressing/Undressing Lower body dressing      What is the patient wearing?: Pants, Incontinence brief     Lower body assist Assist for lower body dressing: Total Assistance - Patient < 25% (supine rolling side)     Toileting Toileting    Toileting assist Assist for toileting: Total Assistance - Patient < 25%     Transfers Chair/bed transfer  Transfers assist  Chair/bed transfer activity did not occur: Safety/medical concerns  Chair/bed transfer assist level: Minimal Assistance - Patient > 75% (SB transfer)     Locomotion Ambulation   Ambulation assist   Ambulation activity did not occur: Safety/medical concerns          Walk 10 feet activity   Assist  Walk 10 feet activity did not occur: Safety/medical concerns        Walk 50 feet activity   Assist Walk 50 feet with 2 turns activity did not occur: Safety/medical concerns         Walk 150 feet activity   Assist Walk 150 feet activity did not occur: Safety/medical concerns         Walk 10 feet on uneven surface  activity   Assist Walk 10 feet on uneven surfaces activity did not occur: Safety/medical concerns         Wheelchair     Assist Is the patient using a wheelchair?: Yes Type of Wheelchair: Manual Wheelchair activity did not occur: Safety/medical concerns  Wheelchair assist level: Supervision/Verbal cueing Max wheelchair distance: 150'    Wheelchair 50 feet with 2 turns activity    Assist    Wheelchair 50 feet with 2 turns activity did not occur: Safety/medical concerns   Assist Level: Supervision/Verbal cueing   Wheelchair 150 feet activity     Assist  Wheelchair 150 feet activity did not occur: Safety/medical concerns   Assist Level: Supervision/Verbal cueing   Blood pressure 117/71, pulse 64, temperature (!) 97.4 F (36.3 C), temperature source Oral, resp. rate 18, height '5\' 9"'$  (1.753  m), weight 92.1 kg, SpO2 94 %.  Medical Problem List and Plan: 1. 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              -patient may shower             -ELOS/Goals: S/Min A 20-22 days  D/c 6/17  Spoke with Oncology NP- working on plan  Con't CIR_ PT, OT- pt to participate as tolerated- going to arrange to send to [facility] on Tuesday.   Will try very hard to get back here afterwards for SCI  treatment 2.  Impaired mobility: continue Lovenox             -antiplatelet therapy: DAPT on hold for procedure  5/24- Plavix and ASA restarted today 3. Pain Management:  Tylenol prn.   5/26- on Oxycodone 5-10 mg q4 hours- will add tramadol 100 mg q8 hours for pain control and to try and reduce confusion.   5/27- pain doing better- con't regimen 4. Mood: LCSW to follow for evaluation and support.              -antipsychotic agents: N/A 5. Neuropsych: This patient is capable of making decisions on her own behalf. Very HOH and not quitehimself cognitively.  6. Skin/Wound Care: Routine pressure relief measures.  7. Fluids/Electrolytes/Nutrition: Encourage fluid intake. Recheck CMET in am.  8. CAD/SSS s/p ICD/PPM: On Zetia and Ranexa--ASA/Plavix on hold for procedure 9. T2DM: Hgb A1c has dropped from 6.8-->5.4 since 11/19/21.             --has been off metformin since last admission.  --continue to  monitor BS ac/hs and [place] SSI for elevated BS.   5/26- CBG's controlled- con't regimen 10: Anemia of chronic disease: Recheck CBC in am.  11. Thrombocytopenia: Has been 121 since 04/30-->115. --off ASA/Plavix --monitor for signs of bleeding.  5/24- will recheck labs in AM  5/25- platelets up to 144k- doing better 12. Pre-renal azotemia: Encourage fluid intake.   5/23- Cr up slightly to 1.31- from average of 1.2- will push fluids-   5/24- will recheck BMP tomorrow AM  5/25- Cr 1.24 and BUN 34- is dry- will need  IVFs- but will start after therapy- 75 cc/hour x 24 hours  5/26- will con't IVFs 100cc hour another 24 hours since Cr up to 1.32 and BUN 32  5/27- Cr down to 1.05 and BUN 30- still a little dry, but will recheck in Am and restart IVFs if needed 13. 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 Urinary retention: Foley d/ced. Continue Flomax/Proscar. Increase Flomax to 0.'8mg'$ . Add bethenacol '5mg'$  TID  5/25- still requiring caths- q6 hours- will con't to push fluids- no changes 15. COPD: Respiratory status stable. Will change Yupelri to MDI per patient request.  16. Right knee buckling: discussed importance of quadricpes strengthening 17. Numbness and tingling of lower extremities: discussed Qutenza as an option outpatient.  18. Post TKA right knee pain: discussed that Qutenza can be tried on knee as well outpatient.  19. Hyperkalemia: given kayexalate today 20. Constipation in setting of neurogenic bowel: give kayexalate today  5/23- has been cleaned out- on bowel program nightly 21. Abdominal pain: still constipation on KUB today despite BM today- will give milk of mag to help clear out.   5/25- resolved for now 22. LLE weakness  5/25- will have PT check strength to compare from admission- if still weaker after pain meds (since pain is affecting HF/KE), then will get thoracic/lumbar MRI- will need contrast with and without based on last MRI- will give IV pain meds for MRI if need be.   5/26- will get thoracic and lumbar MRIs- still pending- giving Ativan and Morphine IV for MRI's.   5/27- wasn't done yesterday- reordered meds for MRI- to be done at 3pm today- has pacemaker and hearing aids. Nursing have been able to arrange- GREAT WORK! 23. Hyponatremia  5/26- will recheck in Am after IVFs- encouraged pt  to drink some more, but not overdrink due to low Na.   5/27- Na up to 132-  will try off IVFs and recheck in AM 24. N/V/burping  5/27- will change protonix to 40 mg IV and might need BID 25. Poor appetite/poor nutrition  5/27- will add Megace '800mg'$  daily. 26. Penile/scrotal edema 5/27- per wife, better than yesterday- will elevate with towel- demonstrated to nurse/pt/wife- and monitor     I spent a total of  56  minutes on total care today- >50% coordination of care- due to d/w nursing x3- prolonged d/w with chare nurse about getting MRI and with pt/wife about plans.   LOS: 8 days A FACE TO FACE EVALUATION WAS PERFORMED  Donnis Phaneuf 04/12/2022, 12:34 PM

## 2022-04-12 NOTE — Consult Note (Signed)
Neuropsychological Consultation   Patient:   Austin Oliver   DOB:   11-May-1956  MR Number:  [id number]  Location:  [facility] [id number] [place] [address] Dept: [place]: [phone]           Date of Service:   04/11/2022  Start Time:   10 AM End Time:   11 AM  Provider/Observer:  Ilean Skill, Psy.D.       Clinical Neuropsychologist       Billing Code/Service: [phone]  Chief Complaint:    Austin Oliver is a 66 year old male who is recently been admitted to the comprehensive inpatient rehabilitation services.  The patient has a past medical history including cirrhosis, hypertension, obstructive sleep apnea, SSS status post PPM, CAD with stent, COPD, prior COVID infection and recent admissions due to bilateral hemispheric cryptogenic stroke in both January and April of this year.  These were felt to be due to hypotensive/diarrhea.  Patient was readmitted on 03/25/2022 with falls and right lower extremity weakness with numbness and inability to walk.  MRI cervical, lumbar and thoracic spine showed abnormal cord signal at T3 and T4 level suggestive of cord ischemia versus transverse myelitis and several areas of abnormal T1 marrow signal/contrast-enhancement with concern for metastatic disease or lymphoma.  CT identified numerous hypodensities throughout the liver suggestive of metastatic disease and numerous other areas suggestive of metastatic disease.  Multiple stroke events were considered to be due to hyper coagulated state from underlying malignancy.  Patient was recommended for CIR due to functional decline.  Reason for Service:  Patient was referred for neuropsychological consultation due to coping and adjustment with significant neurological deficits related to spinal cord infarcts and recent diagnosis of metastatic cancer and multiple regions.  Below is the HPI for the current  admission.  HPI:  Austin Oliver. [name] with history of cirrhosis, HTN, OSA, SSS s/p PPM, CAD s/p stent, COPD, R-TKR 10/2021, COVID, recent admissions bilateral hemispheric cryptogenic stroke Jan 11/19/21 and 04//30/23 felt to be due to hypotension/diarrhea who was readmitted on 03/25/22 with falls X 2, RLE weakness with numbness and inability to walk. Foley in place since last stroke due to urinary retention and was changed out at admission.  UDS negative. 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 MRI cervical, lumbar and thoracic spine showed abnormal cord signal at T3 and T4 level suggestive of cord ischemia v/s transverse myelitis, several areas of abnormal abnormal T1 marrow signal/contrast enhancement worrisome for metastatic disease or lymphoma.   CT chest/abdomen/pelvis was negative for bony lesions, numerous hypodensities throughout the liver c/w metastatic disease, splenomegaly with indeterminate hypodensities c/w metastases, bilateral adrenal thickening suspicious for metastatic disease as well as diffuse lymphadenopathy within upper abdomen and retroperitoneum.  Dr. Quinn Axe following for input and felt patient with lesion due to  spinal cord infarct over transverse myelitis but multiple recent strokes due to hypercoagulable state from underlying malignancy.   Liver biopsy ordered and IR recommended holding plavix for 5 days. Plans for liver biopsy on 05/19.  PT/OT has been working with patient and he continues to be limited by BLE weakness and working on standing in [employer] walker for 2-3 minutes. CIR recommended due to functional decline. He currently complains of lower>upper extremity weakness.   Current Status:  Patient was awake and alert but clearly  in distress and reporting pain.  Patient's wife was present.  Time was spent answering any questions the  patient had regarding this hospitalization although many of his questions are more pertinent for oncology and I avoided with explanation the reason for not addressing some of his questions have to do with his status more directly related to other specialties.  We did address issues with regard to his concern about what to expect in the future and how he was going to cope and manage with not only his neurological and spinal cord issues but his metastatic cancer.  Behavioral Observation: [name]  presents as a 66 y.o.-year-old Right handed r Male who appeared his stated age. his dress was Appropriate and he was Well Groomed and his manners were Appropriate to the situation.  his participation was indicative of Appropriate, Inattentive, and Redirectable behaviors.  There were physical disabilities noted.  he displayed an appropriate level of cooperation and motivation.     Interactions:    Active Redirectable  Attention:   abnormal and attention span appeared shorter than expected for age  Memory:   within normal limits; recent and remote memory intact  Visuo-spatial:  not examined  Speech (Volume):  normal  Speech:   normal; normal  Thought Process:  Coherent and Relevant  Though Content:  WNL; not suicidal and not homicidal  Orientation:   person, place, time/date, and situation  Judgment:   Fair  Planning:   Fair  Affect:    Anxious  Mood:    Dysphoric  Insight:   Fair  Intelligence:   normal   Medical History:   Past Medical History:  Diagnosis Date   Cirrhosis of liver ([place])    COPD (chronic obstructive pulmonary disease) ([place])    Coronary artery calcification seen on CAT scan 01/14/2017   Essential hypertension 09/11/2020   GERD (gastroesophageal reflux disease)    History of kidney stones    Hyperlipidemia    Hypothyroidism    OSA on CPAP    Mild with AHI 7.8/hr >>on CPAP   Osteoarthritis    Presence of permanent cardiac pacemaker    Shingles 2012          Patient Active Problem List   Diagnosis Date Noted   Splenomegaly 04/03/2022   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 ([place]) 04/03/2022   suspected Demyelinating changes in brain ([place]) 04/03/2022   Occult malignancy ([place]) 04/03/2022   Obesity (BMI 30-39.9) 04/02/2022   Spinal cord infarction ([place]) 03/31/2022   Liver masses 03/30/2022   AAA (abdominal aortic aneurysm) ([place]) 03/30/2022   Avascular necrosis of femoral head, right ([place]) 03/30/2022   Pituitary microadenoma ([place]) 03/28/2022   Chronic retention of urine, due to BPH 03/25/2022   Sick sinus syndrome ([place]) 03/25/2022   Controlled type 2 diabetes mellitus without complication, without long-term current use of insulin ([place]) 03/25/2022   History of knee replacement 03/25/2022   Recurrent falls 03/25/2022   TIA (transient ischemic attack) 03/16/2022   Cardiac pacemaker in situ 03/05/2022   COPD (chronic obstructive pulmonary disease) ([place]) 03/05/2022   Gastroesophageal reflux disease 01/22/2022   Kidney stone 01/22/2022   Ultrasound scan abnormal 01/22/2022   Unspecified abnormal finding in specimens from other organs, systems and tissues 01/22/2022   Cirrhosis ([place]) 01/22/2022   History of CVA (cerebrovascular accident) 11/20/2021   Stiffness of right knee 11/04/2021   Osteoarthritis of right knee 10/31/2021   OSA on CPAP 08/26/2021   DOE (dyspnea on exertion) 04/04/2021   Anemia  due to vitamin B12 deficiency 04/04/2021   Statin myopathy 09/18/2020   CAD (coronary artery disease) 09/11/2020   Essential hypertension 09/11/2020   Aortic atherosclerosis ([place]) 07/31/2020   Pancytopenia ([place]) 03/29/2020   Bicytopenia 03/08/2020   Pain in right knee 11/22/2019   Cervical post-laminectomy syndrome 05/09/2019   DDD (degenerative disc disease), cervical 05/09/2019   Chronic neck pain 03/15/2019   Trigger finger of right hand 02/23/2018   Avascular necrosis of hip, left ([place]) 03/09/2017   Mixed hyperlipidemia 01/14/2017    Coronary artery disease with stable angina pectoris ([place]) 01/14/2017    Family Med/Psych History:  Family History  Problem Relation Age of Onset   Dementia Mother    Heart Problems Father    COPD Father    Lung cancer Father     Impression/DX:  KANON NOVOSEL is a 66 year old male who is recently been admitted to the comprehensive inpatient rehabilitation services.  The patient has a past medical history including cirrhosis, hypertension, obstructive sleep apnea, SSS status post PPM, CAD with stent, COPD, prior COVID infection and recent admissions due to bilateral hemispheric cryptogenic stroke in both January and April of this year.  These were felt to be due to hypotensive/diarrhea.  Patient was readmitted on 03/25/2022 with falls and right lower extremity weakness with numbness and inability to walk.  MRI cervical, lumbar and thoracic spine showed abnormal cord signal at T3 and T4 level suggestive of cord ischemia versus transverse myelitis and several areas of abnormal T1 marrow signal/contrast-enhancement with concern for metastatic disease or lymphoma.  CT identified numerous hypodensities throughout the liver suggestive of metastatic disease and numerous other areas suggestive of metastatic disease.  Multiple stroke events were considered to be due to hyper coagulated state from underlying malignancy.  Patient was recommended for CIR due to functional decline.  Patient was awake and alert but clearly in distress and reporting pain.  Patient's wife was present.  Time was spent answering any questions the patient had regarding this hospitalization although many of his questions are more pertinent for oncology and I avoided with explanation the reason for not addressing some of his questions have to do with his status more directly related to other specialties.  We did address issues with regard to his concern about what to expect in the future and how he was going to cope and manage with not  only his neurological and spinal cord issues but his metastatic cancer.   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 Clinical Neuropsychologist

## 2022-04-13 LAB — GLUCOSE, CAPILLARY
Glucose-Capillary: 157 mg/dL — ABNORMAL HIGH (ref 70–99)
Glucose-Capillary: 205 mg/dL — ABNORMAL HIGH (ref 70–99)
Glucose-Capillary: 264 mg/dL — ABNORMAL HIGH (ref 70–99)
Glucose-Capillary: 274 mg/dL — ABNORMAL HIGH (ref 70–99)

## 2022-04-13 MED ORDER — ONDANSETRON 4 MG PO TBDP
4.0000 mg | ORAL_TABLET | Freq: Every day | ORAL | Status: DC
Start: 2022-04-13 — End: 2022-04-15
  Administered 2022-04-13 – 2022-04-15 (×3): 4 mg via ORAL
  Filled 2022-04-13 (×3): qty 1

## 2022-04-13 MED ORDER — ONDANSETRON 4 MG PO TBDP: 4.0000 mg | ORAL_TABLET | Freq: Four times a day (QID) | ORAL | Status: DC | PRN

## 2022-04-13 MED ORDER — PANTOPRAZOLE SODIUM 40 MG IV SOLR
40.0000 mg | Freq: Two times a day (BID) | INTRAVENOUS | Status: DC
  Administered 2022-04-13 – 2022-04-14 (×3): 40 mg via INTRAVENOUS
  Filled 2022-04-13 (×4): qty 10

## 2022-04-13 MED ORDER — CALCIUM CARBONATE ANTACID 500 MG PO CHEW
1.0000 | CHEWABLE_TABLET | Freq: Three times a day (TID) | ORAL | Status: DC
  Administered 2022-04-13 – 2022-04-14 (×5): 200 mg via ORAL
  Filled 2022-04-13 (×7): qty 1

## 2022-04-13 MED ORDER — ONDANSETRON 4 MG PO TBDP: 4.0000 mg | ORAL_TABLET | Freq: Three times a day (TID) | ORAL | Status: DC | PRN

## 2022-04-13 NOTE — Progress Notes (Signed)
PROGRESS NOTE   Subjective/Complaints:  Burps and then vomiting up small amounts- also having nausea frequently- he thinks it's meds- never had problem with tramadol in past.  Could also be Megace? Stomach also aches/hurts- yesterday felt like had low BG- dizzy and nauseated during MRI.   Strongly suggested taking meds with clear soda and at leasta few bites of crackers.      ROS:   Pt denies SOB, abd pain, CP, (+) N/V/C/D, and vision changes  Objective:   DG Abd 1 View  Result Date: 04/12/2022 CLINICAL DATA:  Nausea and vomiting. EXAM: ABDOMEN - 1 VIEW COMPARISON:  04/06/2022 FINDINGS: Bowel gas pattern appears nonobstructed. No dilated loops of small bowel identified. Right renal stone is again noted within the expected location of the inferior pole measuring 1.1 cm. Status post left hip arthroplasty. IMPRESSION: 1. Nonobstructive bowel gas pattern. 2. Right renal stone. Electronically Signed   By: Kerby Moors M.D.   On: 04/12/2022 11:23   MR THORACIC SPINE W WO CONTRAST  Result Date: 04/12/2022 CLINICAL DATA:  Spinal cord injury. Follow-up. Worsening weakness and numbness in lower extremities. Known cancer. Known spinal cord infarct. EXAM: MRI THORACIC WITHOUT AND WITH CONTRAST TECHNIQUE: Multiplanar and multiecho pulse sequences of the thoracic spine were obtained without and with intravenous contrast. This patient has a conditional pacemaker and was scanned according to safety guidelines. CONTRAST:  9.4m GADAVIST GADOBUTROL 1 MMOL/ML IV SOLN COMPARISON:  CT chest, abdomen, and pelvis 03/29/2022; MRI thoracic spine 03/28/2022 FINDINGS: Alignment:  No sagittal spondylolisthesis. Vertebrae: Vertebral body heights are maintained. There is again abnormal decreased T1 increased T2/stir signal and diffuse enhancement seen throughout the majority of the T4 vertebral body. There also appears to be decreased T1 and increased T2/STIR  signal with enhancement within the C7 and T1 vertebral bodies, similar to 03/28/2022 MRI. Small fat intensity hemangioma within the T8 vertebral body is unchanged. Cord: There is again abnormal increased T2/STIR signal within the T3-4 through T4-5 levels of the cord. This may be slightly improved from prior. The cord now appears to be normal in caliber with resolution of the prior possible minimal enlargement. No definite cord enhancement is seen. The conus terminates at the superior L1 level. Paraspinal and other soft tissues: There is again thickening and nodularity within the bilateral adrenal glands. Partially visualized increased T2 signal lesion within the posterior right hepatic lobe likely corresponding to one of numerable lesion seen on 03/29/2022 CT.11 mm short axis lymph node is seen to the right of the descending thoracic aorta (axial series 22, image 20). Given the diffuse lymphadenopathy on prior CT this also remains concerning for malignant involvement. Disc levels: C6-7: Partially visualized on sagittal images only. Mild-to-moderate posterior disc osteophyte complex. T3-4: Mild midline posterior disc protrusion with mild cephalad extension unchanged. No central canal or neuroforaminal stenosis. T4-5: Facet joint hypertrophy contributes to mild bilateral neuroforaminal stenosis. T5-6: Mild broad-based posterior disc bulge with mild left intraforaminal extension and mild-to-moderate left neuroforaminal stenosis, unchanged. T9-10: Minimal posterior disc bulge with mild bilateral intraforaminal extension. Mild bilateral facet joint hypertrophy. Mild bilateral neuroforaminal stenosis. T10-11: Facet joint hypertrophy contributes to mild right neuroforaminal stenosis. IMPRESSION: 1. There is  again abnormal increased T2 signal within the central aspect of the cord fairly diffusely at the T3 and T4 levels. There appears to be resolution of the prior mild cord swelling/enlargement. Findings again may be  secondary to cord ischemia or transverse myelitis. Again no definite enhancement is seen to suggest a neoplastic process. 2. There is again abnormal decreased T1 increased T2/STIR signal with enhancement within the C7, T1, and T4 vertebral bodies that is suspicious for metastatic disease. Note is made metastases were suggested within the liver, adrenals, and abdominal lymph nodes on 03/29/2022 CT. Electronically Signed   By: Yvonne Kendall M.D.   On: 04/12/2022 18:50   MR Lumbar Spine W Wo Contrast  Result Date: 04/12/2022 CLINICAL DATA:  Spinal cord injury. Follow-up. Worsening numbness and weakness. EXAM: MRI LUMBAR SPINE WITHOUT AND WITH CONTRAST TECHNIQUE: Multiplanar and multiecho pulse sequences of the lumbar spine were obtained without and with intravenous contrast. Patient has conditional pacemaker and was scanned according to safety guidelines. CONTRAST:  9.83m GADAVIST GADOBUTROL 1 MMOL/ML IV SOLN COMPARISON:  MRI lumbar spine 03/28/2022; CT chest, abdomen, and pelvis 03/29/2022 FINDINGS: Segmentation: There is again partial sacralization of L5. Rudimentary disc at L5-S1. Alignment:  Trace retrolisthesis of L4 on L5, unchanged. Vertebrae: Vertebral body heights are maintained. Mild superior L2 endplate degenerative Schmorl's node. There is again decreased T1 signal and subtle increased T2/stir signal and possible enhancement seen within the posterior L3 and L4 vertebral bodies and more diffusely within the L5 and S1 vertebral bodies. Conus medullaris and cauda equina: Conus extends to the superior L1 level. Conus and cauda equina appear normal. Paraspinal and other soft tissues: There again borderline enlarged para-aortic retroperitoneal lymph nodes. A right lower pole renal 11 mm likely cyst with layering calcification is seen, as on prior CT. Borderline infrarenal abdominal aortic aneurysm. Disc levels: L1-2: Mild bilateral facet joint hypertrophy. Mild broad-based posterior disc osteophyte complex  with mild bilateral intraforaminal extension. Mild-to-moderate bilateral neuroforaminal stenosis. L2-3: Mild bilateral facet joint hypertrophy. Mild-to-moderate broad-based posterior disc osteophyte complex. Mild-to-moderate bilateral neuroforaminal stenosis. L3-4: Mild bilateral facet joint hypertrophy. Mild to moderate broad-based posterior disc osteophyte complex. Moderate left and mild right neuroforaminal stenosis. Mild narrowing of the lateral recesses and mild central canal stenosis. L4-5: Mild-to-moderate bilateral facet joint hypertrophy. Posterior disc bulge with midline posterior disc protrusion. No significant central canal stenosis. No significant neuroforaminal stenosis. L5-S1: Rudimentary disc. No posterior disc bulge, central canal narrowing, or neuroforaminal stenosis. IMPRESSION: 1. No significant change from 03/28/2022. 2. Probable bone marrow signal abnormality within the L3 through S1 vertebral bodies, concerning for metastatic disease. 3. Multilevel degenerative disc and joint changes as above. Electronically Signed   By: RYvonne KendallM.D.   On: 04/12/2022 19:03    No results for input(s): WBC, HGB, HCT, PLT in the last 72 hours.  Recent Labs    04/11/22 0536 04/12/22 0539  NA 129* 132*  K 4.1 4.6  CL 92* 95*  CO2 23 23  GLUCOSE 109* 164*  BUN 32* 30*  CREATININE 1.32* 1.05  CALCIUM 8.2* 8.6*    Intake/Output Summary (Last 24 hours) at 04/13/2022 1243 Last data filed at 04/13/2022 0530 Gross per 24 hour  Intake --  Output 1250 ml  Net -1250 ml        Physical Exam: Vital Signs Blood pressure 108/61, pulse 67, temperature 97.7 F (36.5 C), temperature source Oral, resp. rate 18, height '5\' 9"'$  (1.753 m), weight 92.1 kg, SpO2 91 %.  General: awake, alert, appropriate, sitting u in bed; appears sad; wife at bedside- tearful; NAD HENT: conjugate gaze; oropharynx moist CV: regular rate; no JVD Pulmonary: CTA B/L; no W/R/R- good air movement GI: soft, NT;  slightly distended vs protuberant; normoactive BS Psychiatric: appropriate but near tears a few times- when discussing MRI Neurological: Ox3  strength is basically same as yesterday still as well as sensation Significantly decreased sensation from L1- S2 B/L- and also pinprick decreased L DF 3-/5 and PF 3-/5; HF/KE 1/5- but having pain Skin- no skin breakdown on backside- slight red patch below R nipple- no dressing from liver biopsy spot Neurological:     Mental Status: He is alert and oriented to person, place, and time.     Comments: Speech clear. Able to answer orientation questions and follow simple commands without difficulty. Unable to lift right lower extremity, LLE 4/5, b/l UE 4/5 with the exception of EE 3/5  LLE- HF and KE 1/5; DF/PF 4/5-  5/25- RLE 1/5 Extremities: trace pedal edema present bilaterally.  GU: moderate scrotal edema and mild to moderate penile edema   Assessment/Plan: 1. Functional deficits which require 3+ hours per day of interdisciplinary therapy in a comprehensive inpatient rehab setting. Physiatrist is providing close team supervision and 24 hour management of active medical problems listed below. Physiatrist and rehab team continue to assess barriers to discharge/monitor patient progress toward functional and medical goals  Care Tool:  Bathing    Body parts bathed by patient: Right arm, Left arm, Chest, Abdomen, Front perineal area, Right upper leg, Left upper leg, Face   Body parts bathed by helper: Buttocks, Right lower leg, Left lower leg     Bathing assist Assist Level: Moderate Assistance - Patient 50 - 74%     Upper Body Dressing/Undressing Upper body dressing   What is the patient wearing?: Pull over shirt    Upper body assist Assist Level: Minimal Assistance - Patient > 75% (Supine HOB elevated)    Lower Body Dressing/Undressing Lower body dressing      What is the patient wearing?: Pants, Incontinence brief     Lower body assist  Assist for lower body dressing: Total Assistance - Patient < 25%     Toileting Toileting    Toileting assist Assist for toileting: Total Assistance - Patient < 25%     Transfers Chair/bed transfer  Transfers assist  Chair/bed transfer activity did not occur: Safety/medical concerns  Chair/bed transfer assist level: Moderate Assistance - Patient 50 - 74% (SB)     Locomotion Ambulation   Ambulation assist   Ambulation activity did not occur: Safety/medical concerns          Walk 10 feet activity   Assist  Walk 10 feet activity did not occur: Safety/medical concerns        Walk 50 feet activity   Assist Walk 50 feet with 2 turns activity did not occur: Safety/medical concerns         Walk 150 feet activity   Assist Walk 150 feet activity did not occur: Safety/medical concerns         Walk 10 feet on uneven surface  activity   Assist Walk 10 feet on uneven surfaces activity did not occur: Safety/medical concerns         Wheelchair     Assist Is the patient using a wheelchair?: Yes Type of Wheelchair: Manual Wheelchair activity did not occur: Safety/medical concerns  Wheelchair assist level: Supervision/Verbal cueing Max wheelchair distance: 100  Wheelchair 50 feet with 2 turns activity    Assist    Wheelchair 50 feet with 2 turns activity did not occur: Safety/medical concerns   Assist Level: Supervision/Verbal cueing   Wheelchair 150 feet activity     Assist  Wheelchair 150 feet activity did not occur: Safety/medical concerns   Assist Level: Supervision/Verbal cueing   Blood pressure 108/61, pulse 67, temperature 97.7 F (36.5 C), temperature source Oral, resp. rate 18, height '5\' 9"'$  (1.753 m), weight 92.1 kg, SpO2 91 %.  Medical Problem List and Plan: 1. 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               -patient may shower             -ELOS/Goals: S/Min A 20-22 days  D/c 6/17  Spoke with Oncology NP- working on plan  Con't CIR_ PT, OT- pt to participate as tolerated- going to arrange to send to [facility] on Tuesday.   Con't CIR PT and OT- d/w to [facility] Either Monday or Tuesday and will get back here afterwards.  2.  Impaired mobility: continue Lovenox             -antiplatelet therapy: DAPT on hold for procedure  5/24- Plavix and ASA restarted today 3. Pain Management:  Tylenol prn.   5/26- on Oxycodone 5-10 mg q4 hours- will add tramadol 100 mg q8 hours for pain control and to try and reduce confusion.   5/27- pain doing better- con't regimen  5/28- pain much better with scheduled tramadol 4. Mood: LCSW to follow for evaluation and support.              -antipsychotic agents: N/A 5. Neuropsych: This patient is capable of making decisions on her own behalf. Very HOH and not quitehimself cognitively.  6. Skin/Wound Care: Routine pressure relief measures.  7. Fluids/Electrolytes/Nutrition: Encourage fluid intake. Recheck CMET in am.  8. CAD/SSS s/p ICD/PPM: On Zetia and Ranexa--ASA/Plavix on hold for procedure 9. T2DM: Hgb A1c has dropped from 6.8-->5.4 since 11/19/21.             --has been off metformin since last admission.  --continue to  monitor BS ac/hs and [place] SSI for elevated BS.   5/26- CBG's controlled- con't regimen  5/28- CBGs rising due to decadron- 130s-low 200s- will con't SSI and wait on DM meds for now 10: Anemia of chronic disease: Recheck CBC in am.  11. Thrombocytopenia: Has been 121 since 04/30-->115. --off ASA/Plavix --monitor for signs of bleeding.  5/24- will recheck labs in AM  5/25- platelets up to 144k- doing better 12. Pre-renal azotemia: Encourage fluid intake.   5/23- Cr up slightly to 1.31- from average of 1.2- will push fluids-   5/24- will recheck BMP tomorrow AM  5/25- Cr 1.24 and BUN 34- is dry- will need IVFs- but will start after therapy- 75 cc/hour  x 24 hours  5/26- will con't IVFs 100cc hour another 24 hours since Cr up to 1.32 and BUN 32  5/27- Cr down to 1.05 and BUN 30- still a little dry, but will recheck in Am and restart IVFs if needed  5/28- labs in AM 13. 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 Urinary retention: Foley d/ced. Continue Flomax/Proscar. Increase Flomax to 0.'8mg'$ . Add bethenacol '5mg'$   TID  5/25- still requiring caths- q6 hours- will con't to push fluids- no changes  5/28- no changes- con't in/out caths- stopped Bethanacol- pt thinks it's causing nausea? 15. COPD: Respiratory status stable. Will change Yupelri to MDI per patient request.  16. Right knee buckling: discussed importance of quadricpes strengthening 17. Numbness and tingling of lower extremities: discussed Qutenza as an option outpatient.  18. Post TKA right knee pain: discussed that Qutenza can be tried on knee as well outpatient.  19. Hyperkalemia: given kayexalate today  5/28- K+ 4.6 20. Constipation in setting of neurogenic bowel: give kayexalate today  5/23- has been cleaned out- on bowel program nightly 21. Abdominal pain: still constipation on KUB today despite BM today- will give milk of mag to help clear out.   5/25- resolved for now 22. LLE weakness  5/25- will have PT check strength to compare from admission- if still weaker after pain meds (since pain is affecting HF/KE), then will get thoracic/lumbar MRI- will need contrast with and without based on last MRI- will give IV pain meds for MRI if need be.   5/26- will get thoracic and lumbar MRIs- still pending- giving Ativan and Morphine IV for MRI's.   5/27- wasn't done yesterday- reordered meds for MRI- to be done at 3pm today- has pacemaker and hearing aids. Nursing have been able to arrange- GREAT WORK!  5/28- MRI shows no new infarct, however has signs of mets to  lumbar spine- which could explain his Sx's?Went over with pt. And wife 81. Hyponatremia  5/26- will recheck in Am after IVFs- encouraged pt to drink some more, but not overdrink due to low Na.   5/27- Na up to 132- will try off IVFs and recheck   5/28- will recheck labs in AM 24. N/V/burping  5/27- will change protonix to 40 mg IV and might need BID  5/28- change to BID and add tums scheduled with meals.  25. Poor appetite/poor nutrition  5/27- will add Megace '800mg'$  daily. 26. Penile/scrotal edema 5/27- per wife, better than yesterday- will elevate with towel- demonstrated to nurse/pt/wife- and monitor     I spent a total of 39   minutes on total care today- >50% coordination of care- due to d/w nursing about CBG's as well as prolonged time with pt/wife going over MRI results and discussing N/V.    LOS: 9 days A FACE TO FACE EVALUATION WAS PERFORMED  Tyqwan Pink 04/13/2022, 12:43 PM

## 2022-04-13 NOTE — Progress Notes (Addendum)
Physical Therapy Session Note  Patient Details  Name: Austin Oliver MRN: [id number] Date of Birth: 16-Jul-1956  Today's Date: 04/13/2022 PT Individual Time: 1102-1200 PT Individual Time Calculation (min): 58 min   Short Term Goals: Week 1:  PT Short Term Goal 1 (Week 1): Pt will perform overall bed mobility with MinA. PT Short Term Goal 2 (Week 1): Pt will perform sit<>stand transfers with overall MinA to LRAD. PT Short Term Goal 3 (Week 1): Pt will perform stand pivot transfers with mod A using LRAD. PT Short Term Goal 4 (Week 1): Pt will ambulate at least 20 feet with ModA using LRAD. PT Short Term Goal 5 (Week 1): Pt will propel w/c at least 100 ft with up to [place].  Skilled Therapeutic Interventions/Progress Updates: Pt presents supine in bed and agreeable to therapy.  Pt states no pain and agreeable to transfer OOB.  Pt requires mod to max A for bringing LEs off side of bed and reaches for railing w/ verbal cues and then completes sup to sit transfers.  Pt sat EOB w/ assist for proper placement of feet on floor.  Pt  performed sidelying to R for total A w/ placement of SB.  Pt required mod A for sequential SB transfer w/ total A for re-positioning of feet on floor.  Pt requires verbal cues for speed, safety and forward lean to assist w/ transfer.  Pt wheeled self 100' w/ BUEs and supervision, occasional cueing for hand placement for increased run.  Pt able to perform x 1 left turn.  Pt performed seated ball catch and throw (small blue T-ball), including overhead and chest throw w/o trunk support in w/c.  Pt performed bouncing and catching larger ball including to B sides w/o trunk support.  Pt became frustrated when unable to correctly name his grandchildren but w/ time was able to recall.  Pt wheeled self x 100' back to room and remained sitting in w/c w/ chair alarm on and all needs in reach.  Spouse present.     Therapy Documentation Precautions:  Precautions Precautions: Fall Precaution  Comments: RLE hemiplegia, LLE hemipareisis, Restrictions Weight Bearing Restrictions: No General:   Vital Signs:   Pain:0/10 w/ pain meds given.   Mobility:   Locomotion :    Trunk/Postural Assessment :    Balance:   Exercises:   Other Treatments:      Therapy/Group: Individual Therapy  Ladoris Gene 04/13/2022, 12:21 PM

## 2022-04-13 NOTE — Progress Notes (Signed)
Occupational Therapy Session Note  Patient Details  Name: Austin Oliver MRN: [id number] Date of Birth: 11/13/1956  Today's Date: 04/13/2022 OT Individual Time: 8921-1941 OT Individual Time Calculation (min): 55 min    Short Term Goals: Week 1:  OT Short Term Goal 1 (Week 1): Patient will complete toilet transfer with max A of 1 OT Short Term Goal 2 (Week 1): Patient will thread one LE into pant leg using adaptive equipment OT Short Term Goal 3 (Week 1): Patient will maintain dynamic balance at EOB with no more than CGA  Skilled Therapeutic Interventions/Progress Updates:  Skilled OT intervention completed with focus on activity tolerance, bathing/dressing. Pt received semi-upright in bed, with wife present, un-rated pain expressed in back and slight nausea with vomiting episode prior to therapist entry, however pre-medicated. Offered rest breaks, pillow positioning and modifications for pain reduction. Pt requesting to bathe and dress this session, with pt preferring to complete bed level for pain relief. Utilized bed features with bilateral rails for to pull self from semi-supine to sitting with min A for trunk control, with pt only able to maintain sitting with BUE supported for <10 secs before increased pain. Able to doff/donn shirt with unilateral UE and CGA for balance. Bathed overall at mod A level for buttocks and BLEs. Able to roll > R side with CGA for LLE and > L with mod A for RLE and trunk, pillow utilized in between legs for additional comfort. Pt with scrotal edema, with therapist providing towel per request to elevate scrotum with new brief donned and barrier cream applied. Might would benefit from scrotal sling for increased comfort. Total A LB dressing. Discussed with pt and wife about option of mechanical lift that could promote getting OOB vs increased pain and requirement of +3 people for transfers, with pt receptive and interested in mechanism and trying in future. +2 assist  total A needed for boosting towards HOB. Provided ice chips as option per report of dry mouth. Pt was left upright in bed, with bed alarm on and all needs in reach at end of session.   Therapy Documentation Precautions:  Precautions Precautions: Fall Precaution Comments: RLE hemiplegia, LLE hemipareisis, Restrictions Weight Bearing Restrictions: No    Therapy/Group: Individual Therapy  Clarita Mcelvain E Declan Mier 04/13/2022, 7:34 AM

## 2022-04-13 NOTE — Progress Notes (Signed)
Pt given suppository, bowel program followed, dig stem performed. Pt tolerated well, was able to roll to left side with minimal assistance, no other needs at this time.

## 2022-04-14 DIAGNOSIS — D649 Anemia, unspecified: Secondary | ICD-10-CM

## 2022-04-14 DIAGNOSIS — R7989 Other specified abnormal findings of blood chemistry: Secondary | ICD-10-CM

## 2022-04-14 DIAGNOSIS — R630 Anorexia: Secondary | ICD-10-CM

## 2022-04-14 LAB — CBC
HCT: 30.2 % — ABNORMAL LOW (ref 39.0–52.0)
Hemoglobin: 9.7 g/dL — ABNORMAL LOW (ref 13.0–17.0)
MCH: 28.5 pg (ref 26.0–34.0)
MCHC: 32.1 g/dL (ref 30.0–36.0)
MCV: 88.8 fL (ref 80.0–100.0)
Platelets: 156 10*3/uL (ref 150–400)
RBC: 3.4 MIL/uL — ABNORMAL LOW (ref 4.22–5.81)
RDW: 18.4 % — ABNORMAL HIGH (ref 11.5–15.5)
WBC: 4.2 10*3/uL (ref 4.0–10.5)
nRBC: 0 % (ref 0.0–0.2)

## 2022-04-14 LAB — GLUCOSE, CAPILLARY
Glucose-Capillary: 177 mg/dL — ABNORMAL HIGH (ref 70–99)
Glucose-Capillary: 191 mg/dL — ABNORMAL HIGH (ref 70–99)
Glucose-Capillary: 183 mg/dL — ABNORMAL HIGH (ref 70–99)
Glucose-Capillary: 159 mg/dL — ABNORMAL HIGH (ref 70–99)

## 2022-04-14 LAB — COMPREHENSIVE METABOLIC PANEL
ALT: 29 U/L (ref 0–44)
AST: 31 U/L (ref 15–41)
Albumin: 1.9 g/dL — ABNORMAL LOW (ref 3.5–5.0)
Alkaline Phosphatase: 113 U/L (ref 38–126)
Anion gap: 12 (ref 5–15)
BUN: 30 mg/dL — ABNORMAL HIGH (ref 8–23)
CO2: 23 mmol/L (ref 22–32)
Calcium: 9.2 mg/dL (ref 8.9–10.3)
Chloride: 97 mmol/L — ABNORMAL LOW (ref 98–111)
Creatinine, Ser: 1.15 mg/dL (ref 0.61–1.24)
GFR, Estimated: >60 mL/min (ref >60)
Glucose, Bld: 190 mg/dL — ABNORMAL HIGH (ref 70–99)
Potassium: 4.4 mmol/L (ref 3.5–5.1)
Sodium: 132 mmol/L — ABNORMAL LOW (ref 135–145)
Total Bilirubin: 2 mg/dL — ABNORMAL HIGH (ref 0.3–1.2)
Total Protein: 5.4 g/dL — ABNORMAL LOW (ref 6.5–8.1)

## 2022-04-14 NOTE — Progress Notes (Signed)
Occupational Therapy Session Note  Patient Details  Name: Austin Oliver MRN: [id number] Date of Birth: 1956-01-24  Today's Date: 04/14/2022 OT Individual Time: 0830-0900 OT Individual Time Calculation (min): 30 min    Short Term Goals: Week 2:  OT Short Term Goal 1 (Week 2): Patient will complete toilet transfer with max A of 1 OT Short Term Goal 2 (Week 2): Patient will thread one LE into pant leg using adaptive equipment OT Short Term Goal 3 (Week 2): Patient will maintain dynamic balance at EOB with no more than CGA  Skilled Therapeutic Interventions/Progress Updates:    Pt received supine in bed with wife present in the room. Pt reported that he was not in any pain and was agreeable to OT session. Pt supine > seated EOB with min A for cueing of hand placement. Pt completed a slide board transfer with Mod A for sliding and hand placement. Pt cued to lean forward to increase momentum for ease of transfer. Pt transported to day gym to work on static and dynamic balance while seated EOM. Completed 10 chest presses while leaning forward holding a 2 lbs dowel bimanually. Graded the activity to facilitate more trunk control by leaning further down each time. Expressed pain in abdominal area during the last 4 reps. Pt voiced he needed to use the restroom. Pt transported back to room for time management. Pt left supine in bed with direct hand off to PT.  Therapy Documentation Precautions:  Precautions Precautions: Fall Precaution Comments: RLE hemiplegia, LLE hemipareisis, Restrictions Weight Bearing Restrictions: No   Therapy/Group: Individual Therapy  Ledonna Dormer 04/14/2022, 11:40 AM

## 2022-04-14 NOTE — Progress Notes (Shared)
Physical Therapy Weekly Progress Note  Patient Details  Name: Austin Oliver MRN: [id number] Date of Birth: 05/14/1956  Beginning of progress report period: Apr 05, 2022 End of progress report period: Apr 14, 2022  Today's Date: 04/14/2022 PT Individual Time: 0905-1000 PT Individual Time Calculation (min): 55 min   Patient has met 1 of 5 short term goals.  Pt has made slow gains this current period of therapy. Over the last week pt with several days of increased lethargy, decrease in cognition, and increased numbness/weakness in LLE which has limited participation in therapy. Pt has demonstrated some improvements in past few days with cognition and awareness. Pt currently requires modA for bed mobility, modA for slide board transfers to/from bed, and requires use of Sara to attempt standing. Per oncology report anticipate d/c to hospitalist service later this week.   Patient continues to demonstrate the following deficits muscle weakness and muscle paralysis and impaired timing and sequencing and unbalanced muscle activation and therefore will continue to benefit from skilled PT intervention to increase functional independence with mobility.  Patient {LTG progression:3041653}.  {plan of care:3041654}  PT Short Term Goals Week 1:  PT Short Term Goal 1 (Week 1): Pt will perform overall bed mobility with MinA. PT Short Term Goal 1 - Progress (Week 1): Progressing toward goal PT Short Term Goal 2 (Week 1): Pt will perform sit<>stand transfers with overall MinA to LRAD. PT Short Term Goal 2 - Progress (Week 1): Not met PT Short Term Goal 3 (Week 1): Pt will perform stand pivot transfers with mod A using LRAD. PT Short Term Goal 3 - Progress (Week 1): Not met PT Short Term Goal 4 (Week 1): Pt will ambulate at least 20 feet with ModA using LRAD. PT Short Term Goal 4 - Progress (Week 1): Not met PT Short Term Goal 5 (Week 1): Pt will propel w/c at least 100 ft with up to MinA. PT Short Term Goal 5  - Progress (Week 1): Met Week 2:         Therapy Documentation Precautions:  Precautions Precautions: Fall Precaution Comments: RLE hemiplegia, LLE hemipareisis, Restrictions Weight Bearing Restrictions: No General:   Vital Signs:  Pain: Pain Assessment Pain Scale: 0-10 Pain Score: 0-No pain Vision/Perception     Mobility:   Locomotion :    Trunk/Postural Assessment :    Balance:   Exercises:   Other Treatments:     Therapy/Group: Individual Therapy    04/14/2022, 11:37 AM  

## 2022-04-14 NOTE — Progress Notes (Signed)
Occupational Therapy Session Note  Patient Details  Name: Austin Oliver MRN: [id number] Date of Birth: [birth year]  Today's Date: 04/14/2022 OT Individual Time: 1300-1415 OT Individual Time Calculation (min): 75 min    Short Term Goals: Week 2:  OT Short Term Goal 1 (Week 2): Patient will complete toilet transfer with max A of 1 OT Short Term Goal 2 (Week 2): Patient will thread one LE into pant leg using adaptive equipment OT Short Term Goal 3 (Week 2): Patient will maintain dynamic balance at EOB with no more than CGA  Skilled Therapeutic Interventions/Progress Updates:    Pt received supine in bed with wife present in the room and agreeable to OT session. Pt did not report any pain at the start of tx. Pt participated in skilled therapy focusing on UE strength, ROM, functional transfers, and overall balance to increase independence in ADLs and decrease caregiver burden. Pt transferred via slide board to w/c with Mod A. Pt transported to therapy gym for time management. Used the UE ergometer in HIIT style, 3-4 minutes on with 2 minute rest break and cueing for higher exertion to challenge UE and cardiovascular endurance. Pt transferred to the mat with slide board and Min A and cues for hand placement and forward weight shift. Completed 7x chest press with 2lbs dowel to address sitting balance without UE support- pt requiring up to Min A dynamically. Completed 3x of sitting EOM > stand with Max +2. Mentioned arms kept jerking and general pain in abdominal and back area. Pt left supine in bed with call bell in reach and all needs met. Pt wife was present in the room.   Therapy Documentation Precautions:  Precautions Precautions: Fall Precaution Comments: new lymphoma dx with spinal mets, BLE weakness Restrictions Weight Bearing Restrictions: No    Therapy/Group: Individual Therapy  Catalina Lunger 04/14/2022, 2:20 PM

## 2022-04-14 NOTE — Progress Notes (Signed)
Physical Therapy Session Note  Patient Details  Name: Austin Oliver MRN: [id number] Date of Birth: 1956/02/12  Today's Date: 04/14/2022 PT Individual Time: 0905-1000 PT Individual Time Calculation (min): 55 min   Short Term Goals: Week 1:  PT Short Term Goal 1 (Week 1): Pt will perform overall bed mobility with MinA. PT Short Term Goal 2 (Week 1): Pt will perform sit<>stand transfers with overall MinA to LRAD. PT Short Term Goal 3 (Week 1): Pt will perform stand pivot transfers with mod A using LRAD. PT Short Term Goal 4 (Week 1): Pt will ambulate at least 20 feet with ModA using LRAD. PT Short Term Goal 5 (Week 1): Pt will propel w/c at least 100 ft with up to [place].  Skilled Therapeutic Interventions/Progress Updates: Pt presented in bed handoff from OT agreeable to therapy. Pt noted to have small incontinent BM. Pt was able to perform rolling with minA to allow PTA to perform peri-care and don new brief. Pt with improved rolling and demonstrated throughout session more appropriate behaviors as compared to previous session with this therapist. Pt states that he's aware that LLE isn't as strong as when he first arrived but better than a few days ago. Pt states he attempted standing this am (confirmed in OT note) with decreased pain in R knee. Pt agreeable to sit EOB and work on sitting balance. Pt performed supine to sit with modA with mild posterior lean upon sitting. PTA deflated bed and pt participated in sitting balance activities reaching and stacking cups as well as reaching to place cups on lower surface (chair) and performing anterior leans to hand cups to spouse. Pt was overall CGA fading to close supervision for static balance and CGA for dynamic balance with min to moderate challenges. Pt was able to tolerate sitting EOB ~25 min before fatigue and pt states no significant increase in back pain with activity. Pt required modA to return to supine however in supine pt was able to use BUE to  boost self up to [facility]. PTA then performed heel cord stretching and pt was able to perform AA heel slides, AA hip abd/add, hip ER "fall outs" x 5-7 on LLE. Pt left in bed at end of session nearly asleep with call bell within reach and needs met.      Therapy Documentation Precautions:  Precautions Precautions: Fall Precaution Comments: RLE hemiplegia, LLE hemipareisis, Restrictions Weight Bearing Restrictions: No General:   Vital Signs:  Pain: Pain Assessment Pain Scale: 0-10 Pain Score: 0-No pain Mobility:   Locomotion :    Trunk/Postural Assessment :    Balance:   Exercises:   Other Treatments:      Therapy/Group: Individual Therapy  Austin Oliver 04/14/2022, 11:36 AM

## 2022-04-14 NOTE — Progress Notes (Signed)
Occupational Therapy Session Note  Patient Details  Name: Austin Oliver MRN: [id number] Date of Birth: Nov 26, 1955  Today's Date: 04/14/2022 OT Individual Time: 0700-0800 OT Individual Time Calculation (min): 60 min    Short Term Goals: Week 1:  OT Short Term Goal 1 (Week 1): Patient will complete toilet transfer with max A of 1 OT Short Term Goal 2 (Week 1): Patient will thread one LE into pant leg using adaptive equipment OT Short Term Goal 3 (Week 1): Patient will maintain dynamic balance at EOB with no more than CGA  Skilled Therapeutic Interventions/Progress Updates:    1:1. Pt received in bed eating breakfast with discussion about possible DC to [facility], CLOF and DC planning. Pt with no nausea this date and agreeable to OOB Tx. Pt completes sup>sit with MAX A and SB transfer EOB<>w/c with MOD A Overall on level surface. Grooming with supervision seated. 2 standing trials in sara + with postential BM in brief d/t odor. Pt with poor hip extension reliant on Ues and shoudlers to help stay upright in sling. Pt return to bed with wife present calling NT to check to need to be cleaned up. Exited session with pt seated in bed, exit alarm on and call light in reach   Therapy Documentation Precautions:  Precautions Precautions: Fall Precaution Comments: RLE hemiplegia, LLE hemipareisis, Restrictions Weight Bearing Restrictions: No General:   Therapy/Group: Individual Therapy  Tonny Branch 04/14/2022, 8:03 AM

## 2022-04-14 NOTE — Progress Notes (Signed)
PROGRESS NOTE   Subjective/Complaints:  No new complaints or concerns today. Reports BM today. Reports LLE a little stronger than few days ago.   ROS:   Pt denies Fevers, chills,  SOB, abd pain, CP, (+) N/V/C/D, and vision changes  Objective:   MR THORACIC SPINE W WO CONTRAST  Result Date: 04/12/2022 CLINICAL DATA:  Spinal cord injury. Follow-up. Worsening weakness and numbness in lower extremities. Known cancer. Known spinal cord infarct. EXAM: MRI THORACIC WITHOUT AND WITH CONTRAST TECHNIQUE: Multiplanar and multiecho pulse sequences of the thoracic spine were obtained without and with intravenous contrast. This patient has a conditional pacemaker and was scanned according to safety guidelines. CONTRAST:  9.21m GADAVIST GADOBUTROL 1 MMOL/ML IV SOLN COMPARISON:  CT chest, abdomen, and pelvis 03/29/2022; MRI thoracic spine 03/28/2022 FINDINGS: Alignment:  No sagittal spondylolisthesis. Vertebrae: Vertebral body heights are maintained. There is again abnormal decreased T1 increased T2/stir signal and diffuse enhancement seen throughout the majority of the T4 vertebral body. There also appears to be decreased T1 and increased T2/STIR signal with enhancement within the C7 and T1 vertebral bodies, similar to 03/28/2022 MRI. Small fat intensity hemangioma within the T8 vertebral body is unchanged. Cord: There is again abnormal increased T2/STIR signal within the T3-4 through T4-5 levels of the cord. This may be slightly improved from prior. The cord now appears to be normal in caliber with resolution of the prior possible minimal enlargement. No definite cord enhancement is seen. The conus terminates at the superior L1 level. Paraspinal and other soft tissues: There is again thickening and nodularity within the bilateral adrenal glands. Partially visualized increased T2 signal lesion within the posterior right hepatic lobe likely corresponding to  one of numerable lesion seen on 03/29/2022 CT.11 mm short axis lymph node is seen to the right of the descending thoracic aorta (axial series 22, image 20). Given the diffuse lymphadenopathy on prior CT this also remains concerning for malignant involvement. Disc levels: C6-7: Partially visualized on sagittal images only. Mild-to-moderate posterior disc osteophyte complex. T3-4: Mild midline posterior disc protrusion with mild cephalad extension unchanged. No central canal or neuroforaminal stenosis. T4-5: Facet joint hypertrophy contributes to mild bilateral neuroforaminal stenosis. T5-6: Mild broad-based posterior disc bulge with mild left intraforaminal extension and mild-to-moderate left neuroforaminal stenosis, unchanged. T9-10: Minimal posterior disc bulge with mild bilateral intraforaminal extension. Mild bilateral facet joint hypertrophy. Mild bilateral neuroforaminal stenosis. T10-11: Facet joint hypertrophy contributes to mild right neuroforaminal stenosis. IMPRESSION: 1. There is again abnormal increased T2 signal within the central aspect of the cord fairly diffusely at the T3 and T4 levels. There appears to be resolution of the prior mild cord swelling/enlargement. Findings again may be secondary to cord ischemia or transverse myelitis. Again no definite enhancement is seen to suggest a neoplastic process. 2. There is again abnormal decreased T1 increased T2/STIR signal with enhancement within the C7, T1, and T4 vertebral bodies that is suspicious for metastatic disease. Note is made metastases were suggested within the liver, adrenals, and abdominal lymph nodes on 03/29/2022 CT. Electronically Signed   By: RYvonne KendallM.D.   On: 04/12/2022 18:50   MR Lumbar Spine W Wo Contrast  Result Date: 04/12/2022  CLINICAL DATA:  Spinal cord injury. Follow-up. Worsening numbness and weakness. EXAM: MRI LUMBAR SPINE WITHOUT AND WITH CONTRAST TECHNIQUE: Multiplanar and multiecho pulse sequences of the lumbar  spine were obtained without and with intravenous contrast. Patient has conditional pacemaker and was scanned according to safety guidelines. CONTRAST:  9.70m GADAVIST GADOBUTROL 1 MMOL/ML IV SOLN COMPARISON:  MRI lumbar spine 03/28/2022; CT chest, abdomen, and pelvis 03/29/2022 FINDINGS: Segmentation: There is again partial sacralization of L5. Rudimentary disc at L5-S1. Alignment:  Trace retrolisthesis of L4 on L5, unchanged. Vertebrae: Vertebral body heights are maintained. Mild superior L2 endplate degenerative Schmorl's node. There is again decreased T1 signal and subtle increased T2/stir signal and possible enhancement seen within the posterior L3 and L4 vertebral bodies and more diffusely within the L5 and S1 vertebral bodies. Conus medullaris and cauda equina: Conus extends to the superior L1 level. Conus and cauda equina appear normal. Paraspinal and other soft tissues: There again borderline enlarged para-aortic retroperitoneal lymph nodes. A right lower pole renal 11 mm likely cyst with layering calcification is seen, as on prior CT. Borderline infrarenal abdominal aortic aneurysm. Disc levels: L1-2: Mild bilateral facet joint hypertrophy. Mild broad-based posterior disc osteophyte complex with mild bilateral intraforaminal extension. Mild-to-moderate bilateral neuroforaminal stenosis. L2-3: Mild bilateral facet joint hypertrophy. Mild-to-moderate broad-based posterior disc osteophyte complex. Mild-to-moderate bilateral neuroforaminal stenosis. L3-4: Mild bilateral facet joint hypertrophy. Mild to moderate broad-based posterior disc osteophyte complex. Moderate left and mild right neuroforaminal stenosis. Mild narrowing of the lateral recesses and mild central canal stenosis. L4-5: Mild-to-moderate bilateral facet joint hypertrophy. Posterior disc bulge with midline posterior disc protrusion. No significant central canal stenosis. No significant neuroforaminal stenosis. L5-S1: Rudimentary disc. No  posterior disc bulge, central canal narrowing, or neuroforaminal stenosis. IMPRESSION: 1. No significant change from 03/28/2022. 2. Probable bone marrow signal abnormality within the L3 through S1 vertebral bodies, concerning for metastatic disease. 3. Multilevel degenerative disc and joint changes as above. Electronically Signed   By: RYvonne KendallM.D.   On: 04/12/2022 19:03    Recent Labs    04/14/22 0609  WBC 4.2  HGB 9.7*  HCT 30.2*  PLT 156    Recent Labs    04/12/22 0539 04/14/22 0609  NA 132* 132*  K 4.6 4.4  CL 95* 97*  CO2 23 23  GLUCOSE 164* 190*  BUN 30* 30*  CREATININE 1.05 1.15  CALCIUM 8.6* 9.2     Intake/Output Summary (Last 24 hours) at 04/14/2022 1213 Last data filed at 04/14/2022 03149Gross per 24 hour  Intake 360 ml  Output 1650 ml  Net -1290 ml         Physical Exam: Vital Signs Blood pressure 113/71, pulse 70, temperature (!) 97.4 F (36.3 C), temperature source Oral, resp. rate 18, height '5\' 9"'$  (1.753 m), weight 92.1 kg, SpO2 97 %.        General: awake, alert, appropriate, sitting  in bed; flat affect HENT: conjugate gaze; oropharynx moist CV: RRR Pulmonary: CTA B/L; no W/R/R- good air movement GI: soft, NT; slightly distended vs protuberant; normoactive BS Psychiatric: appropriate but near tears a few times- when discussing MRI Neurological: Ox3  strength is basically same as yesterday still as well as sensation Significantly decreased sensation from L1- S2 B/L- and also pinprick decreased L DF 3-/5 and PF 3-/5; HF/KE 1/5- but having pain Skin- warm and dry, slight red patch below R nipple- no dressing from liver biopsy spot Neurological:     Mental Status: He is alert  and oriented to person, place, and time.     Comments: Speech clear. Able to answer orientation questions and follow simple commands without difficulty. Unable to lift right lower extremity, LLE 4/5, b/l UE 4/5 with the exception of EE 3/5  LLE- HF and KE 1/5; DF/PF  4/5-  5/25- RLE 1/5 Extremities: trace pedal edema present bilaterally.  GU: moderate scrotal edema and mild to moderate penile edema   Assessment/Plan: 1. Functional deficits which require 3+ hours per day of interdisciplinary therapy in a comprehensive inpatient rehab setting. Physiatrist is providing close team supervision and 24 hour management of active medical problems listed below. Physiatrist and rehab team continue to assess barriers to discharge/monitor patient progress toward functional and medical goals  Care Tool:  Bathing    Body parts bathed by patient: Right arm, Left arm, Chest, Abdomen, Front perineal area, Right upper leg, Left upper leg, Face   Body parts bathed by helper: Buttocks, Right lower leg, Left lower leg     Bathing assist Assist Level: Moderate Assistance - Patient 50 - 74%     Upper Body Dressing/Undressing Upper body dressing   What is the patient wearing?: Pull over shirt    Upper body assist Assist Level: Minimal Assistance - Patient > 75% (Supine HOB elevated)    Lower Body Dressing/Undressing Lower body dressing      What is the patient wearing?: Pants, Incontinence brief     Lower body assist Assist for lower body dressing: Total Assistance - Patient < 25%     Toileting Toileting    Toileting assist Assist for toileting: Total Assistance - Patient < 25%     Transfers Chair/bed transfer  Transfers assist  Chair/bed transfer activity did not occur: Safety/medical concerns  Chair/bed transfer assist level: Moderate Assistance - Patient 50 - 74% (SB)     Locomotion Ambulation   Ambulation assist   Ambulation activity did not occur: Safety/medical concerns          Walk 10 feet activity   Assist  Walk 10 feet activity did not occur: Safety/medical concerns        Walk 50 feet activity   Assist Walk 50 feet with 2 turns activity did not occur: Safety/medical concerns         Walk 150 feet  activity   Assist Walk 150 feet activity did not occur: Safety/medical concerns         Walk 10 feet on uneven surface  activity   Assist Walk 10 feet on uneven surfaces activity did not occur: Safety/medical concerns         Wheelchair     Assist Is the patient using a wheelchair?: Yes Type of Wheelchair: Manual Wheelchair activity did not occur: Safety/medical concerns  Wheelchair assist level: Supervision/Verbal cueing Max wheelchair distance: 100 (Per staff report)    Wheelchair 50 feet with 2 turns activity    Assist    Wheelchair 50 feet with 2 turns activity did not occur: Safety/medical concerns   Assist Level: Supervision/Verbal cueing   Wheelchair 150 feet activity     Assist  Wheelchair 150 feet activity did not occur: Safety/medical concerns   Assist Level: Supervision/Verbal cueing   Blood pressure 113/71, pulse 70, temperature (!) 97.4 F (36.3 C), temperature source Oral, resp. rate 18, height '5\' 9"'$  (1.753 m), weight 92.1 kg, SpO2 97 %.  Medical Problem List and Plan: 1. 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              -patient may shower             -ELOS/Goals: S/Min A 20-22 days  D/c 6/17  Spoke with Oncology NP- working on plan  Con't CIR_ PT, OT- pt to participate as tolerated- going to arrange to send to [facility] on Tuesday.   Con't CIR PT and OT- d/w to [place] Either Monday or Tuesday and will get back here afterwards.   -He was working on sitting balance today 2.  Impaired mobility: continue Lovenox             -antiplatelet therapy: DAPT on hold for procedure  5/24- Plavix and ASA restarted today 3. Pain Management:  Tylenol prn.   5/26- on Oxycodone 5-10 mg q4 hours- will add tramadol 100 mg q8 hours for pain control and to try and reduce confusion.   5/27- pain doing better- con't regimen  5/28- pain much better  with scheduled tramadol 4. Mood: LCSW to follow for evaluation and support.              -antipsychotic agents: N/A 5. Neuropsych: This patient is capable of making decisions on her own behalf. Very HOH and not quitehimself cognitively.  6. Skin/Wound Care: Routine pressure relief measures.  7. Fluids/Electrolytes/Nutrition: Encourage fluid intake. Recheck CMET in am.  8. CAD/SSS s/p ICD/PPM: On Zetia and Ranexa--ASA/Plavix on hold for procedure 9. T2DM: Hgb A1c has dropped from 6.8-->5.4 since 11/19/21.             --has been off metformin since last admission.  --continue to  monitor BS ac/hs and [place] SSI for elevated BS.   5/26- CBG's controlled- con't regimen  5/28- CBGs rising due to decadron- 130s-low 200s- will con't SSI and wait on DM meds for now 10: Anemia of chronic disease:    -5/19 Hb stable at 9.7 11. Thrombocytopenia: Has been 121 since 04/30-->115. --off ASA/Plavix --monitor for signs of bleeding.  5/24- will recheck labs in AM  5/25- platelets up to 144k- doing better 12. Pre-renal azotemia: Encourage fluid intake.   5/23- Cr up slightly to 1.31- from average of 1.2- will push fluids-   5/24- will recheck BMP tomorrow AM  5/25- Cr 1.24 and BUN 34- is dry- will need IVFs- but will start after therapy- 75 cc/hour x 24 hours  5/26- will con't IVFs 100cc hour another 24 hours since Cr up to 1.32 and BUN 32  5/27- Cr down to 1.05 and BUN 30- still a little dry, but will recheck in Am and restart IVFs if needed  5/29 Cr around his baseline at 1.15, follow 13. 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 Urinary retention: Foley d/ced. Continue Flomax/Proscar. Increase Flomax to 0.'8mg'$ . Add bethenacol '5mg'$  TID  5/25- still requiring caths- q6 hours- will con't to push fluids- no changes  5/28- no changes- con't in/out caths- stopped Bethanacol- pt thinks it's  causing nausea? 15. COPD: Respiratory status stable. Will change Yupelri to MDI per patient request.  16. Right knee buckling: discussed importance of quadricpes strengthening 17. Numbness and tingling of lower extremities: discussed Qutenza as an option outpatient.  18. Post TKA right knee pain: discussed that Qutenza can be tried on knee as well outpatient.  19. Hyperkalemia: given kayexalate today  5/28- K+ 4.6 20. Constipation in  setting of neurogenic bowel: give kayexalate today  5/23- has been cleaned out- on bowel program nightly  5/29 he had BM today, improved, continue to follow 21. Abdominal pain: still constipation on KUB today despite BM today- will give milk of mag to help clear out.   5/25- resolved for now 22. LLE weakness  5/25- will have PT check strength to compare from admission- if still weaker after pain meds (since pain is affecting HF/KE), then will get thoracic/lumbar MRI- will need contrast with and without based on last MRI- will give IV pain meds for MRI if need be.   5/26- will get thoracic and lumbar MRIs- still pending- giving Ativan and Morphine IV for MRI's.   5/27- wasn't done yesterday- reordered meds for MRI- to be done at 3pm today- has pacemaker and hearing aids. Nursing have been able to arrange- GREAT WORK!  5/28- MRI shows no new infarct, however has signs of mets to lumbar spine- which could explain his Sx's?Went over with pt. And wife 52. Hyponatremia  5/26- will recheck in Am after IVFs- encouraged pt to drink some more, but not overdrink due to low Na.   5/27- Na up to 132- will try off IVFs and recheck   5/29 Na stable at 132. Continue to follow 24. N/V/burping  5/27- will change protonix to 40 mg IV and might need BID  5/28- change to BID and add tums scheduled with meals.  25. Poor appetite/poor nutrition  5/27- will add Megace '800mg'$  daily.  5/29 ate 75% breakfast-this is improved,  albumin 1.9, continue to follow 26. Penile/scrotal  edema 5/27- per wife, better than yesterday- will elevate with towel- demonstrated to nurse/pt/wife- and monitor     LOS: 10 days A FACE TO [place] 04/14/2022, 12:13 PM

## 2022-04-14 NOTE — Progress Notes (Shared)
Occupational Therapy Discharge Summary  Patient Details  Name: Austin Oliver MRN: [id number] Date of Birth: 1955-12-31  {CHL IP REHAB OT TIME CALCULATIONS:[id number]}   Patient has met 0 of 11 long term goals however has had improved activity tolerance, improved balance, postural control, and improved awareness.  Patient to discharge at overall Mod Assist level.    Reasons goals not met: Pt with new diagnosis of lymphoma and resultant spinal mets. He will d/c to [place] for chemotherapy and medical management of condition. Pt did not finish CIR program and has had a decline d/t new spinal mets. He will hopefully return following introduction to chemotherapy.   Recommendation:  Patient will benefit from ongoing skilled OT services in  acute care setting  to continue to advance functional skills in the area of BADL.  Equipment: No equipment provided  Reasons for discharge: change in medical status  Patient/family agrees with progress made and goals achieved: Yes  OT Discharge Precautions/Restrictions  Precautions Precautions: Fall Precaution Comments: new lymphoma dx with spinal mets, BLE weakness Restrictions Weight Bearing Restrictions: No  Pain Pain Assessment Pain Scale: 0-10 Pain Score: 0-No pain Pain Location: Ear ADL ADL Eating: Set up, Supervision/safety Where Assessed-Eating: Bed level Grooming: Supervision/safety, Setup Where Assessed-Grooming: Wheelchair Upper Body Bathing: Setup, Supervision/safety Where Assessed-Upper Body Bathing: Wheelchair Lower Body Bathing: Moderate assistance (Mod A for bottom and BLE) Where Assessed-Lower Body Bathing: Bed level Upper Body Dressing: Supervision/safety Where Assessed-Upper Body Dressing: Wheelchair Lower Body Dressing: Maximal assistance Where Assessed-Lower Body Dressing: Bed level Toileting: Maximal assistance Toilet Transfer: Moderate assistance Toilet Transfer Method: Software engineer: Extra wide bedside commode, Energy manager: Unable to assess Vision Baseline Vision/History: 1 Wears glasses Patient Visual Report: No change from baseline Vision Assessment?: Yes Eye Alignment: Within Functional Limits Ocular Range of Motion: Within Functional Limits Alignment/Gaze Preference: Within Defined Limits Tracking/Visual Pursuits: Requires cues, head turns, or add eye shifts to track;Impaired - to be further tested in functional context (cognitive deficits impair formal visual screen) Perception  Perception: Within Functional Limits Praxis Praxis: Intact Cognition Cognition Overall Cognitive Status: Impaired/Different from baseline Arousal/Alertness: Awake/alert Orientation Level: Person;Place Person: Oriented Place: Oriented Situation: Oriented Memory: Impaired Memory Impairment: Retrieval deficit;Decreased short term memory Attention: Selective Selective Attention: Impaired Selective Attention Impairment: Functional basic;Verbal basic Awareness: Appears intact Problem Solving: Impaired Problem Solving Impairment: Verbal basic Safety/Judgment: Appears intact Comments: slow processing Brief Interview for Mental Status (BIMS) Repetition of Three Words (First Attempt): 3 Temporal Orientation: Year: Correct Temporal Orientation: Month: Accurate within 5 days Temporal Orientation: Day: Incorrect Recall: "Sock": No, could not recall Recall: "Blue": Yes, no cue required Recall: "Bed": Yes, after cueing ("a piece of furniture") BIMS Summary Score: 11 Sensation Sensation Light Touch: Impaired by gross assessment Light Touch Impaired Details: Impaired RLE (inconsistent report in RLE) Coordination Gross Motor Movements are Fluid and Coordinated: No Fine Motor Movements are Fluid and Coordinated: No Motor  Motor Motor: Hemiplegia Motor - Discharge Observations: new mets to spine, BLE weakness Mobility  Bed Mobility Bed Mobility:  Rolling Left;Rolling Right;Sit to Supine;Supine to Sit Rolling Right: Contact Guard/Touching assist Rolling Left: Contact Guard/Touching assist Right Sidelying to Sit: Moderate Assistance - Patient 50-74% Supine to Sit: Moderate Assistance - Patient 50-74% Sit to Sidelying Right: Moderate Assistance - Patient 50-74% Transfers Sit to Stand: Minimal Assistance - Patient > 75% Stand to Sit: Minimal Assistance - Patient > 75%  Trunk/Postural Assessment  Cervical Assessment Cervical Assessment: Within Functional Limits Thoracic Assessment  Thoracic Assessment: Exceptions to [facility] (rounded shoulders) Lumbar Assessment Lumbar Assessment: Exceptions to [facility] (posterior pelvic tilt) Postural Control Postural Control: Deficits on evaluation Trunk Control: impaired dynamically Righting Reactions: impaired and inadequate  Balance Balance Balance Assessed: Yes Dynamic Sitting Balance Dynamic Sitting - Balance Support: Feet supported Dynamic Sitting - Level of Assistance: 4: Min assist Static Standing Balance Static Standing - Level of Assistance: 1: +2 Total assist Extremity/Trunk Assessment RUE Assessment RUE Assessment: Exceptions to [facility] General Strength Comments: 4-/5 involuntary jerking movement distally LUE Assessment LUE Assessment: Within Functional Limits   Kerra Guilfoil 04/14/2022, 3:24 PM

## 2022-04-15 ENCOUNTER — Encounter (HOSPITAL_COMMUNITY): Payer: Self-pay | Admitting: Physical Medicine and Rehabilitation

## 2022-04-15 ENCOUNTER — Encounter (HOSPITAL_COMMUNITY): Payer: Self-pay | Admitting: Internal Medicine

## 2022-04-15 ENCOUNTER — Inpatient Hospital Stay (HOSPITAL_COMMUNITY): Payer: Medicare Other

## 2022-04-15 ENCOUNTER — Inpatient Hospital Stay (HOSPITAL_COMMUNITY)
Admission: AD | Admit: 2022-04-15 | Discharge: 2022-05-17 | DRG: 823 | Disposition: E | Payer: Medicare Other | Source: Intra-hospital | Attending: Internal Medicine | Admitting: Internal Medicine

## 2022-04-15 ENCOUNTER — Other Ambulatory Visit: Payer: Self-pay

## 2022-04-15 DIAGNOSIS — K592 Neurogenic bowel, not elsewhere classified: Secondary | ICD-10-CM

## 2022-04-15 DIAGNOSIS — Z79890 Hormone replacement therapy (postmenopausal): Secondary | ICD-10-CM

## 2022-04-15 DIAGNOSIS — Z6832 Body mass index (BMI) 32.0-32.9, adult: Secondary | ICD-10-CM

## 2022-04-15 DIAGNOSIS — R601 Generalized edema: Secondary | ICD-10-CM | POA: Diagnosis present

## 2022-04-15 DIAGNOSIS — R338 Other retention of urine: Secondary | ICD-10-CM | POA: Diagnosis present

## 2022-04-15 DIAGNOSIS — E039 Hypothyroidism, unspecified: Secondary | ICD-10-CM | POA: Diagnosis present

## 2022-04-15 DIAGNOSIS — L89150 Pressure ulcer of sacral region, unstageable: Secondary | ICD-10-CM | POA: Diagnosis not present

## 2022-04-15 DIAGNOSIS — E875 Hyperkalemia: Secondary | ICD-10-CM | POA: Diagnosis present

## 2022-04-15 DIAGNOSIS — N401 Enlarged prostate with lower urinary tract symptoms: Secondary | ICD-10-CM | POA: Diagnosis present

## 2022-04-15 DIAGNOSIS — I714 Abdominal aortic aneurysm, without rupture, unspecified: Secondary | ICD-10-CM | POA: Diagnosis present

## 2022-04-15 DIAGNOSIS — N319 Neuromuscular dysfunction of bladder, unspecified: Secondary | ICD-10-CM

## 2022-04-15 DIAGNOSIS — Z8673 Personal history of transient ischemic attack (TIA), and cerebral infarction without residual deficits: Secondary | ICD-10-CM | POA: Diagnosis not present

## 2022-04-15 DIAGNOSIS — Z950 Presence of cardiac pacemaker: Secondary | ICD-10-CM

## 2022-04-15 DIAGNOSIS — C8510 Unspecified B-cell lymphoma, unspecified site: Secondary | ICD-10-CM

## 2022-04-15 DIAGNOSIS — G9511 Acute infarction of spinal cord (embolic) (nonembolic): Secondary | ICD-10-CM | POA: Diagnosis not present

## 2022-04-15 DIAGNOSIS — Z515 Encounter for palliative care: Secondary | ICD-10-CM | POA: Diagnosis not present

## 2022-04-15 DIAGNOSIS — C8333 Diffuse large B-cell lymphoma, intra-abdominal lymph nodes: Principal | ICD-10-CM | POA: Diagnosis present

## 2022-04-15 DIAGNOSIS — K219 Gastro-esophageal reflux disease without esophagitis: Secondary | ICD-10-CM | POA: Diagnosis present

## 2022-04-15 DIAGNOSIS — Z66 Do not resuscitate: Secondary | ICD-10-CM | POA: Diagnosis not present

## 2022-04-15 DIAGNOSIS — Z7189 Other specified counseling: Secondary | ICD-10-CM | POA: Diagnosis not present

## 2022-04-15 DIAGNOSIS — D61810 Antineoplastic chemotherapy induced pancytopenia: Secondary | ICD-10-CM | POA: Diagnosis not present

## 2022-04-15 DIAGNOSIS — G8220 Paraplegia, unspecified: Secondary | ICD-10-CM | POA: Diagnosis present

## 2022-04-15 DIAGNOSIS — E119 Type 2 diabetes mellitus without complications: Secondary | ICD-10-CM | POA: Diagnosis not present

## 2022-04-15 DIAGNOSIS — Z0189 Encounter for other specified special examinations: Secondary | ICD-10-CM

## 2022-04-15 DIAGNOSIS — G893 Neoplasm related pain (acute) (chronic): Secondary | ICD-10-CM | POA: Diagnosis present

## 2022-04-15 DIAGNOSIS — E871 Hypo-osmolality and hyponatremia: Secondary | ICD-10-CM

## 2022-04-15 DIAGNOSIS — Z79899 Other long term (current) drug therapy: Secondary | ICD-10-CM

## 2022-04-15 DIAGNOSIS — J449 Chronic obstructive pulmonary disease, unspecified: Secondary | ICD-10-CM | POA: Diagnosis present

## 2022-04-15 DIAGNOSIS — Z87442 Personal history of urinary calculi: Secondary | ICD-10-CM

## 2022-04-15 DIAGNOSIS — R339 Retention of urine, unspecified: Secondary | ICD-10-CM | POA: Diagnosis present

## 2022-04-15 DIAGNOSIS — E785 Hyperlipidemia, unspecified: Secondary | ICD-10-CM | POA: Diagnosis present

## 2022-04-15 DIAGNOSIS — L89151 Pressure ulcer of sacral region, stage 1: Secondary | ICD-10-CM | POA: Diagnosis not present

## 2022-04-15 DIAGNOSIS — T451X5A Adverse effect of antineoplastic and immunosuppressive drugs, initial encounter: Secondary | ICD-10-CM | POA: Diagnosis present

## 2022-04-15 DIAGNOSIS — C8338 Diffuse large B-cell lymphoma, lymph nodes of multiple sites: Secondary | ICD-10-CM | POA: Diagnosis not present

## 2022-04-15 DIAGNOSIS — R188 Other ascites: Secondary | ICD-10-CM | POA: Diagnosis present

## 2022-04-15 DIAGNOSIS — I10 Essential (primary) hypertension: Secondary | ICD-10-CM | POA: Diagnosis present

## 2022-04-15 DIAGNOSIS — R7989 Other specified abnormal findings of blood chemistry: Secondary | ICD-10-CM

## 2022-04-15 DIAGNOSIS — Z96642 Presence of left artificial hip joint: Secondary | ICD-10-CM | POA: Diagnosis present

## 2022-04-15 DIAGNOSIS — K5900 Constipation, unspecified: Secondary | ICD-10-CM | POA: Diagnosis present

## 2022-04-15 DIAGNOSIS — E1165 Type 2 diabetes mellitus with hyperglycemia: Secondary | ICD-10-CM | POA: Diagnosis present

## 2022-04-15 DIAGNOSIS — I69344 Monoplegia of lower limb following cerebral infarction affecting left non-dominant side: Secondary | ICD-10-CM | POA: Diagnosis not present

## 2022-04-15 DIAGNOSIS — D649 Anemia, unspecified: Secondary | ICD-10-CM

## 2022-04-15 DIAGNOSIS — G4733 Obstructive sleep apnea (adult) (pediatric): Secondary | ICD-10-CM | POA: Diagnosis present

## 2022-04-15 DIAGNOSIS — Z7902 Long term (current) use of antithrombotics/antiplatelets: Secondary | ICD-10-CM

## 2022-04-15 DIAGNOSIS — I2510 Atherosclerotic heart disease of native coronary artery without angina pectoris: Secondary | ICD-10-CM | POA: Diagnosis present

## 2022-04-15 DIAGNOSIS — I495 Sick sinus syndrome: Secondary | ICD-10-CM | POA: Diagnosis present

## 2022-04-15 DIAGNOSIS — K7460 Unspecified cirrhosis of liver: Secondary | ICD-10-CM | POA: Diagnosis present

## 2022-04-15 DIAGNOSIS — Z7982 Long term (current) use of aspirin: Secondary | ICD-10-CM

## 2022-04-15 DIAGNOSIS — D352 Benign neoplasm of pituitary gland: Secondary | ICD-10-CM | POA: Diagnosis present

## 2022-04-15 DIAGNOSIS — R161 Splenomegaly, not elsewhere classified: Secondary | ICD-10-CM | POA: Diagnosis present

## 2022-04-15 LAB — ECHOCARDIOGRAM COMPLETE
AR max vel: 3.27 cm2
AV Area VTI: 3.28 cm2
AV Area mean vel: 3.04 cm2
AV Mean grad: 3.3 mmHg
AV Peak grad: 6.7 mmHg
Ao pk vel: 1.29 m/s
Area-P 1/2: 3.03 cm2
Calc EF: 57.2 %
Height: 69 in
MV VTI: 3.44 cm2
P 1/2 time: 636 msec
S' Lateral: 3.4 cm
Single Plane A2C EF: 55.3 %
Single Plane A4C EF: 59.8 %
Weight: 3248.7 oz

## 2022-04-15 LAB — GLUCOSE, CAPILLARY
Glucose-Capillary: 157 mg/dL — ABNORMAL HIGH (ref 70–99)
Glucose-Capillary: 160 mg/dL — ABNORMAL HIGH (ref 70–99)
Glucose-Capillary: 151 mg/dL — ABNORMAL HIGH (ref 70–99)
Glucose-Capillary: 219 mg/dL — ABNORMAL HIGH (ref 70–99)

## 2022-04-15 LAB — CBC WITH DIFFERENTIAL/PLATELET
Abs Immature Granulocytes: 0.04 10*3/uL (ref 0.00–0.07)
Basophils Absolute: 0 10*3/uL (ref 0.0–0.1)
Basophils Relative: 0 %
Eosinophils Absolute: 0 10*3/uL (ref 0.0–0.5)
Eosinophils Relative: 0 %
HCT: 31.5 % — ABNORMAL LOW (ref 39.0–52.0)
Hemoglobin: 10.2 g/dL — ABNORMAL LOW (ref 13.0–17.0)
Immature Granulocytes: 1 %
Lymphocytes Relative: 8 %
Lymphs Abs: 0.3 10*3/uL — ABNORMAL LOW (ref 0.7–4.0)
MCH: 28.9 pg (ref 26.0–34.0)
MCHC: 32.4 g/dL (ref 30.0–36.0)
MCV: 89.2 fL (ref 80.0–100.0)
Monocytes Absolute: 0.5 10*3/uL (ref 0.1–1.0)
Monocytes Relative: 12 %
Neutro Abs: 3 10*3/uL (ref 1.7–7.7)
Neutrophils Relative %: 79 %
Platelets: 157 10*3/uL (ref 150–400)
RBC: 3.53 MIL/uL — ABNORMAL LOW (ref 4.22–5.81)
RDW: 18.8 % — ABNORMAL HIGH (ref 11.5–15.5)
WBC: 3.8 10*3/uL — ABNORMAL LOW (ref 4.0–10.5)
nRBC: 0 % (ref 0.0–0.2)

## 2022-04-15 LAB — COMPREHENSIVE METABOLIC PANEL
ALT: 27 U/L (ref 0–44)
AST: 31 U/L (ref 15–41)
Albumin: 2.2 g/dL — ABNORMAL LOW (ref 3.5–5.0)
Alkaline Phosphatase: 109 U/L (ref 38–126)
Anion gap: 11 (ref 5–15)
BUN: 43 mg/dL — ABNORMAL HIGH (ref 8–23)
CO2: 26 mmol/L (ref 22–32)
Calcium: 9.6 mg/dL (ref 8.9–10.3)
Chloride: 99 mmol/L (ref 98–111)
Creatinine, Ser: 1.02 mg/dL (ref 0.61–1.24)
GFR, Estimated: >60 mL/min (ref >60)
Glucose, Bld: 160 mg/dL — ABNORMAL HIGH (ref 70–99)
Potassium: 5.2 mmol/L — ABNORMAL HIGH (ref 3.5–5.1)
Sodium: 136 mmol/L (ref 135–145)
Total Bilirubin: 2.7 mg/dL — ABNORMAL HIGH (ref 0.3–1.2)
Total Protein: 5.9 g/dL — ABNORMAL LOW (ref 6.5–8.1)

## 2022-04-15 LAB — HEPATITIS B SURFACE ANTIBODY,QUALITATIVE: Hep B S Ab: NONREACTIVE

## 2022-04-15 LAB — HEPATITIS C ANTIBODY: HCV Ab: NONREACTIVE

## 2022-04-15 LAB — HEPATITIS B CORE ANTIBODY, TOTAL: Hep B Core Total Ab: NONREACTIVE

## 2022-04-15 LAB — HEPATITIS B SURFACE ANTIGEN: Hepatitis B Surface Ag: NONREACTIVE

## 2022-04-15 MED ORDER — ONDANSETRON HCL 4 MG/2ML IJ SOLN
4.0000 mg | Freq: Four times a day (QID) | INTRAMUSCULAR | Status: DC | PRN
  Administered 2022-04-24: 4 mg via INTRAVENOUS
  Filled 2022-04-15: qty 2

## 2022-04-15 MED ORDER — ONDANSETRON HCL 4 MG PO TABS
4.0000 mg | ORAL_TABLET | Freq: Four times a day (QID) | ORAL | Status: DC | PRN
  Administered 2022-04-22: 4 mg via ORAL
  Filled 2022-04-15: qty 1

## 2022-04-15 MED ORDER — POLYETHYLENE GLYCOL 3350 17 G PO PACK: 17.0000 g | PACK | Freq: Every day | ORAL | Status: DC | PRN

## 2022-04-15 MED ORDER — TIOTROPIUM BROMIDE MONOHYDRATE 2.5 MCG/ACT IN AERS: 2.0000 | INHALATION_SPRAY | Freq: Every day | RESPIRATORY_TRACT | Status: DC

## 2022-04-15 MED ORDER — LEVOTHYROXINE SODIUM 112 MCG PO TABS
112.0000 ug | ORAL_TABLET | Freq: Every day | ORAL | Status: DC
Start: 2022-04-16 — End: 2022-04-25
  Administered 2022-04-16 – 2022-04-25 (×10): 112 ug via ORAL
  Filled 2022-04-15 (×11): qty 1

## 2022-04-15 MED ORDER — DOCUSATE SODIUM 100 MG PO CAPS
100.0000 mg | ORAL_CAPSULE | Freq: Two times a day (BID) | ORAL | Status: DC
  Administered 2022-04-15 – 2022-04-24 (×17): 100 mg via ORAL
  Filled 2022-04-15 (×17): qty 1

## 2022-04-15 MED ORDER — STERILE WATER FOR INJECTION IJ SOLN
INTRAMUSCULAR | Status: DC
Start: 2022-04-15 — End: 2022-04-15
  Filled 2022-04-15: qty 10

## 2022-04-15 MED ORDER — TAMSULOSIN HCL 0.4 MG PO CAPS
0.8000 mg | ORAL_CAPSULE | Freq: Every day | ORAL | Status: DC
Start: 2022-04-15 — End: 2022-04-15

## 2022-04-15 MED ORDER — VITAMIN B-12 1000 MCG PO TABS
1000.0000 ug | ORAL_TABLET | Freq: Every morning | ORAL | Status: DC
Start: 2022-04-16 — End: 2022-04-25
  Administered 2022-04-17 – 2022-04-24 (×8): 1000 ug via ORAL
  Filled 2022-04-15 (×8): qty 1

## 2022-04-15 MED ORDER — PANTOPRAZOLE SODIUM 40 MG PO TBEC
40.0000 mg | DELAYED_RELEASE_TABLET | Freq: Every day | ORAL | Status: DC
Start: 2022-04-15 — End: 2022-04-25
  Administered 2022-04-15 – 2022-04-23 (×8): 40 mg via ORAL
  Filled 2022-04-15 (×8): qty 1

## 2022-04-15 MED ORDER — GABAPENTIN 100 MG PO CAPS
200.0000 mg | ORAL_CAPSULE | Freq: Every day | ORAL | Status: DC
Start: 2022-04-15 — End: 2022-04-24
  Administered 2022-04-15 – 2022-04-23 (×9): 200 mg via ORAL
  Filled 2022-04-15 (×9): qty 2

## 2022-04-15 MED ORDER — EZETIMIBE 10 MG PO TABS
10.0000 mg | ORAL_TABLET | Freq: Every morning | ORAL | Status: DC
Start: 2022-04-16 — End: 2022-04-25
  Administered 2022-04-17 – 2022-04-24 (×8): 10 mg via ORAL
  Filled 2022-04-15 (×8): qty 1

## 2022-04-15 MED ORDER — TAMSULOSIN HCL 0.4 MG PO CAPS
0.8000 mg | ORAL_CAPSULE | Freq: Every day | ORAL | Status: DC
  Administered 2022-04-16 – 2022-04-23 (×7): 0.8 mg via ORAL
  Filled 2022-04-15 (×7): qty 2

## 2022-04-15 MED ORDER — INSULIN ASPART 100 UNIT/ML IJ SOLN
0.0000 [IU] | Freq: Three times a day (TID) | INTRAMUSCULAR | Status: DC
  Administered 2022-04-16 – 2022-04-17 (×4): 1 [IU] via SUBCUTANEOUS

## 2022-04-15 MED ORDER — UMECLIDINIUM BROMIDE 62.5 MCG/ACT IN AEPB
1.0000 | INHALATION_SPRAY | Freq: Every day | RESPIRATORY_TRACT | Status: DC
  Administered 2022-04-16 – 2022-04-24 (×9): 1 via RESPIRATORY_TRACT
  Filled 2022-04-15 (×3): qty 7

## 2022-04-15 MED ORDER — ASPIRIN 81 MG PO TBEC
81.0000 mg | DELAYED_RELEASE_TABLET | Freq: Every evening | ORAL | Status: DC
Start: 2022-04-15 — End: 2022-04-25
  Administered 2022-04-15 – 2022-04-23 (×8): 81 mg via ORAL
  Filled 2022-04-15 (×8): qty 1

## 2022-04-15 MED ORDER — CLOPIDOGREL BISULFATE 75 MG PO TABS: 75.0000 mg | ORAL_TABLET | Freq: Every day | ORAL | Status: DC

## 2022-04-15 MED ORDER — FINASTERIDE 5 MG PO TABS
5.0000 mg | ORAL_TABLET | Freq: Every day | ORAL | Status: DC
Start: 2022-04-15 — End: 2022-04-25
  Administered 2022-04-15 – 2022-04-24 (×10): 5 mg via ORAL
  Filled 2022-04-15 (×10): qty 1

## 2022-04-15 NOTE — Progress Notes (Signed)
Recreational Therapy Discharge Summary Patient Details  Name: Austin Oliver MRN: [id number] Date of Birth: July 10, 1956 Today's Date: 03/18/2022   Comments on progress toward goals: Pt did not participate in TR services other than initial evaluation.  Pt transferring to [facility] to address new diagnosis of lymphoma with mets to the spine.   Reasons for discharge: change in medical status- medical treatment/follow up needed at [facility]   Patient/family agrees with progress made and goals achieved: Yes  [name] 04/13/2022, 4:02 PM

## 2022-04-15 NOTE — Progress Notes (Addendum)
Patient ID: Austin Oliver, male   DOB: Aug 31, 1956, 66 y.o.   MRN: [id number]  Per attending, pt will d/c to acute- [place] for chemo treatment and will remain. SW will work on coordinating pt care needs for d/c.  SW met with pt and pt husband to discuss above, and SW will inform when bed has been arranged.   SW spoke with Robin/patient access center [phone] to discuss transfer. Reports will notify when room is ready.   *Receiving physician- Dr. Marylyn Ishihara. Pt accepted to [address] Rm# [id number]; #[phone]. SW provided nursing report info to pt assigned RN.   Transportation scheduled with [facility].   Loralee Pacas, MSW, [place] Office: [phone] Cell: [phone] Fax: [phone]

## 2022-04-15 NOTE — Progress Notes (Signed)
Occupational Therapy Session Note  Patient Details  Name: Austin Oliver MRN: [id number] Date of Birth: 21-Jan-1956  Today's Date: 04/04/2022 OT Individual Time: 0700-0810 OT Individual Time Calculation (min): 70 min    Short Term Goals: Week 2:  OT Short Term Goal 1 (Week 2): Patient will complete toilet transfer with max A of 1 OT Short Term Goal 2 (Week 2): Patient will thread one LE into pant leg using adaptive equipment OT Short Term Goal 3 (Week 2): Patient will maintain dynamic balance at EOB with no more than CGA  Skilled Therapeutic Interventions/Progress Updates:    Pt eating breakfast in bed upon arrival with wife present. Pt agreeable to getting OOB and sitting in w/c this morning. No c/o pain. LB dressing at bed level with tot A. Pt rolling R/L using bed rails with mod A. Supine>sit EOB with max A. SB transfer to w/c with mod A and max verbal cues for sequencing and safety awareness. Pt completed UB bathing/dressing w/c level at sink with supervision. Pt completed grooming tasks with supervision. Discussed transfer to [facility] later in day to begin treatment for lymphoma with spinal mets. Pt remained in w/c with all needs within reach. Belt alarm activated. Wife present.   Therapy Documentation Precautions:  Precautions Precautions: Fall Precaution Comments: new lymphoma dx with spinal mets, BLE weakness Restrictions Weight Bearing Restrictions: No   Pain:  "I'm ok"   Therapy/Group: Individual Therapy  Leroy Libman 03/25/2022, 8:16 AM

## 2022-04-15 NOTE — Progress Notes (Signed)
Inpatient Rehabilitation Care Coordinator Discharge Note   Patient Details  Name: Austin Oliver MRN: [id number] Date of Birth: 1956-04-25   Discharge location: D/c to acute hospital- [facility] for chemo tx  Length of Stay: 10 days  Discharge activity level: Mod A  Home/community participation: Limited  Patient response HE:NIDPOE Literacy - How often do you need to have someone help you when you read instructions, pamphlets, or other written material from your doctor or pharmacy?: Never  Patient response UM:PNTIRW Isolation - How often do you feel lonely or isolated from those around you?: Never  Services provided included: MD, RD, PT, OT, RN, CM, SW, Neuropsych, Pharmacy, TR  Financial Services:  Charity fundraiser Utilized: [place] [employer]  Choices offered to/list presented to: yes  Follow-up services arranged:    Patient response to transportation need: Is the patient able to respond to transportation needs?: Yes In the past 12 months, has lack of transportation kept you from medical appointments or from getting medications?: No In the past 12 months, has lack of transportation kept you from meetings, work, or from getting things needed for daily living?: No    Comments (or additional information):\  Patient/Family verbalized understanding of follow-up arrangements:  Yes  Individual responsible for coordination of the follow-up plan: contact pt wife  Confirmed correct DME delivered: Rana Snare 03/29/2022    Rana Snare

## 2022-04-15 NOTE — Progress Notes (Signed)
Brief oncology note:  As outlined by Dr. Julien Nordmann on 04/11/2022, the patient will need to be transferred to [facility] for chemotherapy.  Plan to administer R-CHOP.  In preparation for chemotherapy, I have ordered an echocardiogram, viral hepatitis studies, and Port-A-Cath placement.  If Port-A-Cath could not be placed either today or early tomorrow morning, will ask for PICC line placement.  Oncology will follow-up with the patient after arrival to [place], DNP, AGPCNP-BC, AOCNP

## 2022-04-15 NOTE — Progress Notes (Signed)
Occupational Therapy Session Note  Patient Details  Name: Austin Oliver MRN: [id number] Date of Birth: 1956-07-29  Today's Date: 04/09/2022 OT Individual Time: 0920-1000 OT Individual Time Calculation (min): 40 min    Short Term Goals: Week 1:  OT Short Term Goal 1 (Week 1): Patient will complete toilet transfer with max A of 1 OT Short Term Goal 2 (Week 1): Patient will thread one LE into pant leg using adaptive equipment OT Short Term Goal 3 (Week 1): Patient will maintain dynamic balance at EOB with no more than CGA Week 2:  OT Short Term Goal 1 (Week 2): Patient will complete toilet transfer with max A of 1 OT Short Term Goal 2 (Week 2): Patient will thread one LE into pant leg using adaptive equipment OT Short Term Goal 3 (Week 2): Patient will maintain dynamic balance at EOB with no more than CGA  Skilled Therapeutic Interventions/Progress Updates:    Patient received up in wheelchair.  Primary OT came in to indicate that patient being discharged today to [facility].  Patient unable to state initially why being transferred but with prompting knew it had "something to do with his cancer doctor."  Patient assisted back to bed via sliding board.  Facilitation and cueing to encourage scooting each hip forward, then weight shifting to right to allow placement of board under LLE.  Patient needed assistance to initially move then readjust feet during transfer.  Once on bed, patient able to scoot himself up toward head of bed in seated position.  Needed assistance to manage legs in transition from sitting to supine.  Bed re-inflated, and wife at bedside.  Call bell within reach and nursing coming in to pass medications at end of session.    Therapy Documentation Precautions:  Precautions Precautions: Fall Precaution Comments: new lymphoma dx with spinal mets, BLE weakness Restrictions Weight Bearing Restrictions: No General: General OT Amount of Missed Time: 20 Minutes  Pain:  Pain - not  scored - patient winced - with attempt to boost self to scoot forward in chair Pain in abdomen - resolved with position change      Therapy/Group: Individual Therapy  Mariah Milling 03/24/2022, 10:16 AM

## 2022-04-15 NOTE — Progress Notes (Signed)
Occupational Therapy Discharge Summary  Patient Details  Name: Austin Oliver MRN: [id number] Date of Birth: 11/08/1956  Patient has met 0 of 11 long term goals.  Pt discharging to [facility] following new dx of lymphoma with mets to spine. Pt currently requires max/tot A for LB dressing tasks at bed level. Bathing at bed level and w/c at sink with min A. UB dressing with supervision. SB transfers with mod A/max A. Pt's wife has been present during therapy sessions. Patient to discharge at overall Total Assist level.    Reasons goals not met: Discharge to [facility] for continued treatment of lymphoma with mets to spine; rehab program not completed  Recommendation:  Recommend Ot as needed   Equipment: No equipment provided  Reasons for discharge: change in medical status  Patient/family agrees with progress made and goals achieved: Yes  OT Discharge ADL ADL Eating: Set up, Supervision/safety Where Assessed-Eating: Bed level Grooming: Supervision/safety, Setup Where Assessed-Grooming: Sitting at sink, Wheelchair Upper Body Bathing: Setup, Supervision/safety Where Assessed-Upper Body Bathing: Sitting at sink, Wheelchair Lower Body Bathing: Moderate assistance Where Assessed-Lower Body Bathing: Bed level Upper Body Dressing: Supervision/safety Where Assessed-Upper Body Dressing: Sitting at sink, Wheelchair Lower Body Dressing: Maximal assistance Where Assessed-Lower Body Dressing: Bed level Toileting: Maximal assistance Toilet Transfer: Moderate assistance Toilet Transfer Method: Theatre manager: Extra wide bedside commode, Energy manager: Unable to assess Vision Baseline Vision/History: 1 Wears glasses Patient Visual Report: No change from baseline Vision Assessment?: No apparent visual deficits Perception  Perception: Within Functional Limits Praxis Praxis: Intact Cognition Cognition Overall Cognitive Status:  Impaired/Different from baseline Arousal/Alertness: Awake/alert Orientation Level: Person;Place;Situation Person: Oriented Place: Oriented Situation: Oriented Memory: Impaired Memory Impairment: Retrieval deficit;Decreased short term memory Attention: Sustained Focused Attention: Appears intact Sustained Attention: Appears intact Awareness: Appears intact Problem Solving: Impaired Problem Solving Impairment: Functional basic Safety/Judgment: Appears intact Comments: slow processing Brief Interview for Mental Status (BIMS) Repetition of Three Words (First Attempt): 3 Temporal Orientation: Year: Correct Temporal Orientation: Month: Accurate within 5 days Temporal Orientation: Day: Correct Recall: "Sock": No, could not recall Recall: "Blue": Yes, no cue required Recall: "Bed": Yes, after cueing ("a piece of furniture") BIMS Summary Score: 12 Sensation Sensation Light Touch: Impaired by gross assessment Light Touch Impaired Details: Impaired RLE Hot/Cold: Appears Intact Proprioception: Appears Intact Stereognosis: Not tested Motor  Motor Motor: Paraplegia Motor - Discharge Observations: new mets to spine, BLE weakness    Trunk/Postural Assessment  Cervical Assessment Cervical Assessment: Within Functional Limits Thoracic Assessment Thoracic Assessment: Exceptions to [facility] (rounded shoulders) Lumbar Assessment Lumbar Assessment: Exceptions to [facility] (posterior pelvic tilt) Postural Control Trunk Control: impaired dynamically Righting Reactions: impaired and inadequate  Balance Static Sitting Balance Static Sitting - Balance Support: Feet supported;Bilateral upper extremity supported Static Sitting - Level of Assistance: 5: Stand by assistance Dynamic Sitting Balance Dynamic Sitting - Balance Support: During functional activity Dynamic Sitting - Level of Assistance: 4: Min assist Extremity/Trunk Assessment RUE Assessment RUE Assessment: Exceptions to [facility] General Strength  Comments: 4-/5 involuntary jerking movement distally LUE Assessment LUE Assessment: Within Functional Limits   Leroy Libman 04/02/2022, 10:53 AM

## 2022-04-15 NOTE — Progress Notes (Signed)
Inpatient Rehabilitation Discharge Medication Review by a Pharmacist  A complete drug regimen review was completed for this patient to identify any potential clinically significant medication issues.  High Risk Drug Classes Is patient taking? Indication by Medication  Antipsychotic Yes Compazine prn N/V  Anticoagulant Yes Lovenox for VTE ppx  Antibiotic No   Opioid Yes Tramadol, Oxycodone prn for pain  Antiplatelet Yes Plavix, ASA for CVA  Hypoglycemics/insulin Yes SSI for DM  Vasoactive Medication No   Chemotherapy No   Other Yes Ranolazine for CP Flomax, Finasteride for BPH Megestrol for appetite Protonix for GERD Dexamethasone for lymphoma Ezetimide for HLD Synthroid for thyroid Inhalers for COPD     Type of Medication Issue Identified Description of Issue Recommendation(s)  Drug Interaction(s) (clinically significant)     Duplicate Therapy     Allergy     No Medication Administration End Date     Incorrect Dose     Additional Drug Therapy Needed     Significant med changes from prior encounter (inform family/care partners about these prior to discharge).    Other       Clinically significant medication issues were identified that warrant physician communication and completion of prescribed/recommended actions by midnight of the next day:  No  Pharmacist comments: Transferring to [facility]  Time spent performing this drug regimen review (minutes):  20 minutes   Tad Moore 03/28/2022 2:23 PM

## 2022-04-15 NOTE — Progress Notes (Signed)
Plan of Care Note for accepted transfer   Patient: Austin Oliver MRN: [id number]   [place]: 04/04/2022  Facility requesting transfer: [facility] Requesting Provider: Dr. Dagoberto Ligas Reason for transfer: [place] course: 66 yo M with history of metastatic lymphoma. Presenting with weakness and paraesthesia. 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 Now in rehab. Had a biopsy that showed lymphoma. Worried about high grade lymphoma and its effects on his spine. Onco recommended urgent chemo. Plan is to admit to [facility] for chemo and then return to CIR.    Plan of care: The patient is accepted for admission to Telemetry unit, at [facility].  Author: Jonnie Finner, DO 03/31/2022  Check [email or web address] for on-call coverage.  Nursing staff, Please call [place] number on Amion as soon as patient's arrival, so appropriate admitting provider can evaluate the pt.

## 2022-04-15 NOTE — Discharge Summary (Signed)
Physician Discharge Summary  Patient ID: Austin Oliver MRN: [id number] DOB/AGE: [birth year] 67 y.o.  Admit date: 04/04/2022 Discharge date: 03/18/2022  Discharge Diagnoses:  Principal Problem:   Spinal cord infarction [facility]) Active Problems:   Essential hypertension   OSA on CPAP   Stiffness of right knee   COPD (chronic obstructive pulmonary disease) (HCC)   Sick sinus syndrome (HCC)   Liver masses   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 [facility])   Neurogenic bowel   Neurogenic bladder   Hyponatremia   Anemia   Prerenal azotemia   Discharged Condition: stable  Significant Diagnostic Studies: DG Abd 1 View  Result Date: 04/12/2022 CLINICAL DATA:  Nausea and vomiting. EXAM: ABDOMEN - 1 VIEW COMPARISON:  04/06/2022 FINDINGS: Bowel gas pattern appears nonobstructed. No dilated loops of small bowel identified. Right renal stone is again noted within the expected location of the inferior pole measuring 1.1 cm. Status post left hip arthroplasty. IMPRESSION: 1. Nonobstructive bowel gas pattern. 2. Right renal stone. Electronically Signed   By: Kerby Moors M.D.   On: 04/12/2022 11:23   DG Abd 1 View  Result Date: 04/06/2022 CLINICAL DATA:  Abdominal pain EXAM: ABDOMEN - 1 VIEW COMPARISON:  04/04/2022 FINDINGS: There remains residual contrast within the rectosigmoid. Stool is present throughout the colon. No dilated loops. No acute osseous abnormality. Left hip arthroplasty. IMPRESSION: Persistent evidence of constipation. Electronically Signed   By: Macy Mis M.D.   On: 04/06/2022 13:02   CT HEAD WO CONTRAST (5MM)  Result Date: 04/09/2022 CLINICAL DATA:  Mental status change.  History of stroke. EXAM: CT HEAD WITHOUT CONTRAST TECHNIQUE: Contiguous axial images were obtained from the base of the skull through the vertex without intravenous contrast. RADIATION DOSE REDUCTION: This exam was performed according to the departmental dose-optimization program which includes automated exposure  control, adjustment of the mA and/or kV according to patient size and/or use of iterative reconstruction technique. COMPARISON:  CT head 03/25/2022.  MRI head 03/27/2022 FINDINGS: Brain: Patchy hypodensities in the cerebral white matter bilaterally, left greater than right are stable. No new area of infarct, hemorrhage, mass Vascular: Negative for hyperdense vessel Skull: Negative Sinuses/Orbits: Mild mucosal edema paranasal sinuses. Negative orbit Other: None IMPRESSION: No acute abnormality and no change from recent studies. Bilateral white matter hypodensities left greater than right are stable. Electronically Signed   By: Franchot Gallo M.D.   On: 04/09/2022 17:09   MR THORACIC SPINE W WO CONTRAST  Result Date: 04/12/2022 CLINICAL DATA:  Spinal cord injury. Follow-up. Worsening weakness and numbness in lower extremities. Known cancer. Known spinal cord infarct. EXAM: MRI THORACIC WITHOUT AND WITH CONTRAST TECHNIQUE: Multiplanar and multiecho pulse sequences of the thoracic spine were obtained without and with intravenous contrast. This patient has a conditional pacemaker and was scanned according to safety guidelines. CONTRAST:  9.25m GADAVIST GADOBUTROL 1 MMOL/ML IV SOLN COMPARISON:  CT chest, abdomen, and pelvis 03/29/2022; MRI thoracic spine 03/28/2022 FINDINGS: Alignment:  No sagittal spondylolisthesis. Vertebrae: Vertebral body heights are maintained. There is again abnormal decreased T1 increased T2/stir signal and diffuse enhancement seen throughout the majority of the T4 vertebral body. There also appears to be decreased T1 and increased T2/STIR signal with enhancement within the C7 and T1 vertebral bodies, similar to 03/28/2022 MRI. Small fat intensity hemangioma within the T8 vertebral body is unchanged. Cord: There is again abnormal increased T2/STIR signal within the T3-4 through T4-5 levels of the cord. This may be slightly improved from prior. The cord now appears to  be normal in caliber with  resolution of the prior possible minimal enlargement. No definite cord enhancement is seen. The conus terminates at the superior L1 level. Paraspinal and other soft tissues: There is again thickening and nodularity within the bilateral adrenal glands. Partially visualized increased T2 signal lesion within the posterior right hepatic lobe likely corresponding to one of numerable lesion seen on 03/29/2022 CT.11 mm short axis lymph node is seen to the right of the descending thoracic aorta (axial series 22, image 20). Given the diffuse lymphadenopathy on prior CT this also remains concerning for malignant involvement. Disc levels: C6-7: Partially visualized on sagittal images only. Mild-to-moderate posterior disc osteophyte complex. T3-4: Mild midline posterior disc protrusion with mild cephalad extension unchanged. No central canal or neuroforaminal stenosis. T4-5: Facet joint hypertrophy contributes to mild bilateral neuroforaminal stenosis. T5-6: Mild broad-based posterior disc bulge with mild left intraforaminal extension and mild-to-moderate left neuroforaminal stenosis, unchanged. T9-10: Minimal posterior disc bulge with mild bilateral intraforaminal extension. Mild bilateral facet joint hypertrophy. Mild bilateral neuroforaminal stenosis. T10-11: Facet joint hypertrophy contributes to mild right neuroforaminal stenosis. IMPRESSION: 1. There is again abnormal increased T2 signal within the central aspect of the cord fairly diffusely at the T3 and T4 levels. There appears to be resolution of the prior mild cord swelling/enlargement. Findings again may be secondary to cord ischemia or transverse myelitis. Again no definite enhancement is seen to suggest a neoplastic process. 2. There is again abnormal decreased T1 increased T2/STIR signal with enhancement within the C7, T1, and T4 vertebral bodies that is suspicious for metastatic disease. Note is made metastases were suggested within the liver, adrenals, and  abdominal lymph nodes on 03/29/2022 CT. Electronically Signed   By: Yvonne Kendall M.D.   On: 04/12/2022 18:50   IR US Guide Bx Asp/Drain  Result Date: 04/07/2022 INDICATION: Multiple hepatic lesions EXAM: Ultrasound-guided biopsy of left liver mass MEDICATIONS: None. ANESTHESIA/SEDATION: Moderate (conscious) sedation was employed during this procedure. A total of Versed 1 mg and Fentanyl 50 mcg was administered intravenously by the radiology nurse. Total intra-service moderate Sedation Time: 10 minutes. The patient's level of consciousness and vital signs were monitored continuously by radiology nursing throughout the procedure under my direct supervision. COMPLICATIONS: None immediate. PROCEDURE: Informed written consent was obtained from the patient after a thorough discussion of the procedural risks, benefits and alternatives. All questions were addressed. Maximal Sterile Barrier Technique was utilized including caps, mask, sterile gowns, sterile gloves, sterile drape, hand hygiene and skin antiseptic. A timeout was performed prior to the initiation of the procedure. Patient position supine on the ultrasound table. Epigastric skin prepped and draped in usual sterile fashion. Following local lidocaine administration, 17 gauge introducer needle was advanced into 1 of the left hepatic lobe lesions, and 4- 18 gauge cores were obtained utilizing continuous ultrasound guidance. Gelfoam slurry was administered through the introducer needle at the biopsy site. Samples were sent to pathology in formalin. Needle removed and hemostasis achieved with 5 minutes of manual compression. Post procedure ultrasound images showed no evidence of significant hemorrhage. IMPRESSION: Ultrasound-guided biopsy of left liver mass as above. Electronically Signed   By: Miachel Roux M.D.   On: 04/07/2022 16:20    DG Abd Portable 1V  Result Date: 04/04/2022 CLINICAL DATA:  Constipation, initial encounter EXAM: PORTABLE ABDOMEN - 1  VIEW COMPARISON:  12/30/2021 FINDINGS: Previously administered contrast now lies within the left colon. Retained fecal material is seen without obstructive change. No free air is noted. No acute bony abnormality  is noted. IMPRESSION: Changes consistent with colonic constipation. Previously administered contrast remains within the colon 6 days later. Electronically Signed   By: Inez Catalina M.D.   On: 04/04/2022 19:04     Labs:  Basic Metabolic Panel: Recent Labs  Lab 04/10/22 0618 04/11/22 0536 04/12/22 0539 04/14/22 0609  NA 130* 129* 132* 132*  K 4.2 4.1 4.6 4.4  CL 91* 92* 95* 97*  CO2 '24 23 23 23  '$ GLUCOSE 105* 109* 164* 190*  BUN 34* 32* 30* 30*  CREATININE 1.24 1.32* 1.05 1.15  CALCIUM 8.7* 8.2* 8.6* 9.2    CBC:    Latest Ref Rng & Units 04/14/2022    6:09 AM 04/10/2022    6:18 AM 04/07/2022    5:24 AM  CBC  WBC 4.0 - 10.5 K/uL 4.2   3.3   3.5    Hemoglobin 13.0 - 17.0 g/dL 9.7   9.2   9.6    Hematocrit 39.0 - 52.0 % 30.2   28.1   28.5    Platelets 150 - 400 K/uL 156   144   126       CBG: Recent Labs  Lab 04/14/22 0601 04/14/22 1202 04/14/22 1632 04/14/22 2105 03/22/2022 0603  GLUCAP 177* 15* 183* 159* 75*    Brief HPI:   Austin Oliver is a 66 y.o. male with history of cirrhosis, HTN, OSA, SSS s/p PPM, CAD s/p stent, COPD, COVID-19 infection in 11/19/21 with cryptogenic stroke and recurrent strokes 03/06/2022 felt to be due to hypotension and hypoperfusion due to diarrhea as well as issues with urinary retention requiring Foley.  He was readmitted on 03/25/2022 with falls x2 RLE weakness with numbness and inability to walk.  MRI/MRI brain done showing progressive white matter changes and 5 mm hypoenhancing nodule in anterior pituitary compatible with microadenoma.  MRI of spine showed cord ischemia versus transverse myelitis at T3 and T4 level and several abnormal T1 marrow signal/contrast enhancement worrisome for metastatic disease or lymphoma.  CT chest, abdomen,  pelvis showed splenomegaly with indeterminate hypodensities compatible with metastases, bilateral adrenal thickening suspicious for metastatic disease as well as diffuse lymphadenopathy within the upper abdomen and retroperitoneum.  Dr. Quinn Axe felt that patient with lesion due to spinal cord infarct and question multiple recent stroke due to hypercoagulable state from underlying malignancy.  Liver biopsy was ordered for work-up and pending due to need for 5-day of washout of Plavix.  PT/OT was working with patient and he continued to be limited by BLE weakness and was working on standing in [employer]. CIR was recommended due to functional decline.    Hospital Course: Austin Oliver was admitted to rehab 04/04/2022 for inpatient therapies to consist of PT, ST and OT at least three hours five days a week. Past admission physiatrist, therapy team and rehab RN have worked together to provide customized collaborative inpatient rehab.   During patient's stay in rehab weekly team conferences were held to monitor patient's progress, set goals and discuss barriers to discharge. At admission, patient required mod to total assist with ADL tasks and mod to max assist with mobility.    Blood pressures were monitored on TID basis and        Current Medications:   aspirin EC  81 mg Oral Daily   bisacodyl  10 mg Rectal QPC supper   calcium carbonate  1 tablet Oral TID WC   clopidogrel  75 mg Oral Daily   dexamethasone  4 mg Oral  Q8H   enoxaparin (LOVENOX) injection  40 mg Subcutaneous Q24H   ezetimibe  10 mg Oral q AM   feeding supplement  1 Container Oral TID BM   finasteride  5 mg Oral QHS   fluticasone  2 spray Each Nare Daily   insulin aspart  0-5 Units Subcutaneous QHS   insulin aspart  0-9 Units Subcutaneous TID WC   levothyroxine  112 mcg Oral QAC breakfast   lidocaine  2 patch Transdermal Q24H   megestrol  800 mg Oral Daily   multivitamin with minerals  1 tablet Oral Daily   ondansetron  4 mg  Oral Daily   pantoprazole (PROTONIX) IV  40 mg Intravenous Q12H   ranolazine  1,000 mg Oral BID   sterile water (preservative free)       tamsulosin  0.8 mg Oral Q supper   traMADol  100 mg Oral Q8H   umeclidinium bromide  1 puff Inhalation Daily      Diet: Regular.   Special Instructions:  Recommend neurology evaluation for onset of new RUE tremor Elevate scrotum/penis for edema control  Continue bowel program after supper. Perform dig stim, insert suppository. Then perform dig stim 10-15 minutes X 2 till rectal vault is clear.     In and out cath every 4-6 hours to keep bladder volumes less than 350 cc. Use lidocaine jelly prn.    Disposition:  Acute hospital --[facility]    Signed: Bary Leriche 04/14/2022, 11:47 AM

## 2022-04-15 NOTE — Progress Notes (Signed)
Sent for pt to come to radiology for [facility] placement. When transport arrived to retrieve pt [facility] was at bedside to transport pt to [facility].

## 2022-04-15 NOTE — Progress Notes (Signed)
Inpatient Rehabilitation Admissions Coordinator   Patient admitted to Cir 04/04/22 with Dr Dagoberto Ligas as attending. Te readmit to acute hospital at [facility] for chemotherapy and possible radiation per Dr Julien Nordmann consult 5/26. I await further clarification between Oncology and Dr Dagoberto Ligas concerning eventual disposition needs so that I can assist with planning readmit to CIR when appropriate and [facility] medicare approval vs other rehab venue dispositions. Recommend continue PT and OT at [facility] as he undergoes medical treatment. Please call me with any questions as needed.  Danne Baxter, RN, MSN Rehab Admissions Coordinator [phone] 04/06/2022 11:50 AM

## 2022-04-15 NOTE — H&P (Signed)
History and Physical    Patient: Austin Oliver VKP:[id number] DOB: 1956-01-15 DOA: 04/14/2022 DOS: the patient was seen and examined on 04/12/2022 PCP: [name], MD  Patient coming from:  Inpatient Rehab  Chief Complaint: Leg weakness  HPI: Austin Oliver is a 66 y.o. male with medical history significant of SSS s/p pacer placement, cirrhosis, recurrent CVA w/ RLE residuals, CAD, COPD. Recently presenting to [facility] for RLE weakness and difficulty with ambulation. W/u found a spinal cord infarction and also noticed widespread mets/lymphadenopathy. A liver Bx was taken and he was discharged to CIR for rehab. The results of his Bx came back and it was concerning for a high grade B-cell lymphoma. Oncology was consulted. It was recommended that he be moved to [facility] for urgent chemotherapy. He denies any other aggravating or alleviating factors.     Review of Systems: As mentioned in the history of present illness. All other systems reviewed and are negative. Past Medical History:  Diagnosis Date   Cirrhosis of liver ([place])    COPD (chronic obstructive pulmonary disease) ([place])    Coronary artery calcification seen on CAT scan 01/14/2017   Essential hypertension 09/11/2020   GERD (gastroesophageal reflux disease)    History of kidney stones    Hyperlipidemia    Hypothyroidism    OSA on CPAP    Mild with AHI 7.8/hr >>on CPAP   Osteoarthritis    Presence of permanent cardiac pacemaker    Shingles 2012   Past Surgical History:  Procedure Laterality Date   CORONARY STENT INTERVENTION N/A 08/24/2020   Procedure: CORONARY STENT INTERVENTION;  Surgeon: [name], MD;  Location: [place] CV LAB;  Service: Cardiovascular;  Laterality: N/A;   HEEL SPUR SURGERY Left 80's   INTRAVASCULAR PRESSURE WIRE/FFR STUDY N/A 04/25/2021   Procedure: INTRAVASCULAR PRESSURE WIRE/FFR STUDY;  Surgeon: [name], MD;  Location: [place] CV LAB;  Service: Cardiovascular;  Laterality: N/A;    INTRAVASCULAR ULTRASOUND/IVUS N/A 08/24/2020   Procedure: Intravascular Ultrasound/IVUS;  Surgeon: [name], MD;  Location: [place] CV LAB;  Service: Cardiovascular;  Laterality: N/A;   IR US GUIDE BX ASP/DRAIN  04/07/2022   KNEE ARTHROPLASTY Right 10/31/2021   Procedure: COMPUTER ASSISTED TOTAL KNEE ARTHROPLASTY;  Surgeon: Rod Can, MD;  Location: [facility];  Service: Orthopedics;  Laterality: Right;   KNEE SURGERY Bilateral    numerous times   LEFT HEART CATH AND CORONARY ANGIOGRAPHY N/A 08/24/2020   Procedure: LEFT HEART CATH AND CORONARY ANGIOGRAPHY;  Surgeon: [name], MD;  Location: [place] CV LAB;  Service: Cardiovascular;  Laterality: N/A;   LEFT HEART CATH AND CORONARY ANGIOGRAPHY N/A 04/25/2021   Procedure: LEFT HEART CATH AND CORONARY ANGIOGRAPHY;  Surgeon: [name], MD;  Location: [place] CV LAB;  Service: Cardiovascular;  Laterality: N/A;   NECK SURGERY  70's   PACEMAKER IMPLANT N/A 12/04/2020   Procedure: PACEMAKER IMPLANT;  Surgeon: [name], MD;  Location: [place] CV LAB;  Service: Cardiovascular;  Laterality: N/A;   TOTAL HIP ARTHROPLASTY Left 03/09/2017   Procedure: LEFT TOTAL HIP ARTHROPLASTY ANTERIOR APPROACH;  Surgeon: Rod Can, MD;  Location: [place];  Service: Orthopedics;  Laterality: Left;  Dr. requesting RNFA   Social History:  reports that he quit smoking about 5 years ago. His smoking use included cigarettes. He has a 90.00 pack-year smoking history. He has never used smokeless tobacco. He reports that he does not drink alcohol and does not use drugs.  Allergies  Allergen  Reactions   Bee Venom Swelling    Extreme Swelling of site     Cleocin [Clindamycin Hcl] Rash    RASH IN BETWEEN FINGERS   Statins Other (See Comments)    Muscles aches with several statins    Family History  Problem Relation Age of Onset   Dementia Mother    Heart Problems Father    COPD Father    Lung cancer Father     Prior to  Admission medications   Medication Sig Start Date End Date Taking? Authorizing Provider  acetaminophen (TYLENOL) 325 MG tablet Take 1-2 tablets (325-650 mg total) by mouth every 6 (six) hours as needed for mild pain (pain score 1-3 or temp > 100.5). 11/01/21   Dorothyann Peng, PA-C  albuterol (VENTOLIN HFA) 108 (90 Base) MCG/ACT inhaler Inhale 2 puffs into the lungs every 6 (six) hours as needed for shortness of breath or wheezing. Patient not taking: Reported on 03/25/2022 05/22/21   [provider]  aspirin EC 81 MG tablet Take 1 tablet (81 mg total) by mouth every evening. Swallow whole. 04/08/22   Lavina Hamman, MD  blood glucose meter kit and supplies KIT Dispense based on patient and insurance preference. Use up to four times daily as directed. 11/23/21   Florencia Reasons, MD  cholecalciferol (VITAMIN D3) 25 MCG (1000 UNIT) tablet Take 1,000 Units by mouth daily.    [provider]  clopidogrel (PLAVIX) 75 MG tablet Take 1 tablet (75 mg total) by mouth daily. 04/08/22   Lavina Hamman, MD  ezetimibe (ZETIA) 10 MG tablet Take 10 mg by mouth in the morning.    [provider]  feeding supplement, GLUCERNA SHAKE, (GLUCERNA SHAKE) LIQD Take 237 mLs by mouth 2 (two) times daily between meals. 04/05/22   Lavina Hamman, MD  finasteride (PROSCAR) 5 MG tablet Take 5 mg by mouth at bedtime. 06/13/21   [provider]  furosemide (LASIX) 20 MG tablet Take 1 tablet (20 mg total) by mouth daily as needed for fluid or edema. Patient not taking: Reported on 03/25/2022 03/18/22   Domenic Polite, MD  gabapentin (NEURONTIN) 100 MG capsule Take 200 mg by mouth at bedtime.    [provider]  levothyroxine (SYNTHROID) 112 MCG tablet Take 112 mcg by mouth daily before breakfast.    [provider]  nitroGLYCERIN (NITROSTAT) 0.4 MG SL tablet Place 1 tablet (0.4 mg total) under the tongue every 5 (five) minutes x 3 doses as needed for chest pain. 08/27/20   Chandrasekhar,  Mahesh A, MD  pantoprazole (PROTONIX) 40 MG tablet Take 40 mg by mouth daily after supper.     [provider]  polyethylene glycol (MIRALAX / GLYCOLAX) 17 g packet Take 17 g by mouth daily as needed for moderate constipation or mild constipation. 04/04/22   Lavina Hamman, MD  ranolazine (RANEXA) 1000 MG SR tablet TAKE 1  BY MOUTH TWICE DAILY Patient taking differently: Take 1,000 mg by mouth 2 (two) times daily. 10/09/21   Chandrasekhar, Lyda Kalata A, MD  rosuvastatin (CRESTOR) 5 MG tablet Take 1 tablet (5 mg total) by mouth daily. 12/27/21   [name], MD  shark liver oil-cocoa butter (PREPARATION H) 0.25-3-85.5 % suppository Place 1 suppository rectally daily as needed for hemorrhoids.    [provider]  tamsulosin (FLOMAX) 0.4 MG CAPS capsule Take 0.8 mg by mouth daily with supper. 10/02/20   [provider]  Tiotropium Bromide Monohydrate (SPIRIVA RESPIMAT) 2.5 MCG/ACT  AERS Inhale 2 puffs into the lungs daily. 01/15/22   Laurin Coder, MD  vitamin B-12 (CYANOCOBALAMIN) 1000 MCG tablet Take 1,000 mcg by mouth in the morning.    [provider]    Physical Exam: General: 66 y.o. male resting in bed in NAD Eyes: PERRL, normal sclera ENMT: Nares patent w/o discharge, orophaynx clear, dentition normal, ears w/o discharge/lesions/ulcers Neck: Supple, trachea midline Cardiovascular: RRR, +S1, S2, no m/g/r, equal pulses throughout Respiratory: CTABL, no w/r/r, normal WOB GI: BS+, NDNT, no masses noted, no organomegaly noted MSK: No c/c; BLE 1+ edema Skin: No rashes, bruises, ulcerations noted Neuro: A&O x 3, RLE 2-3/5 msk strength, LLE 3/5 msk strength Psyc: Appropriate interaction and affect, calm/cooperative  Data Reviewed:  There are no new results to review at this time.  Assessment and Plan: High grade B-cell lymphoma     - admit to inpt, tele     - Onco onboard, appreciate assistance     - will have port placed     - defer further  plan to onco     - palliative care consult  Hx of recurrent CVA Spinal Infarct BLE weakness     - see above     - to return to CIR for further therapy after chemo  Hx of HTN     - BP is soft, hold home regimen   COPD     - continue home regimen when confirmed  Sick sinus syndrome s/p PPM     - continue home regimen when confirmed  CAD HLD     - continue home regimen when confirmed  Pituitary microadenoma     - continue outpt follow up  Chronic retention of urine BPH     - continue foley     - continue flomax/finasteride as BP tolerates  DM2     - DM diet, SSI, glucose checks     - A1c on 4/30 was 5.4  Advance Care Planning:   Code Status: FULL  Consults: Oncology  Family Communication: w/ wife at bedside  Severity of Illness: The appropriate patient status for this patient is OBSERVATION. Observation status is judged to be reasonable and necessary in order to provide the required intensity of service to ensure the patient's safety. The patient's presenting symptoms, physical exam findings, and initial radiographic and laboratory data in the context of their medical condition is felt to place them at decreased risk for further clinical deterioration. Furthermore, it is anticipated that the patient will be medically stable for discharge from the hospital within 2 midnights of admission.   Author: Jonnie Finner, DO 03/23/2022 4:12 PM  For on call review [email or web address].

## 2022-04-15 NOTE — H&P (Signed)
Chief Complaint: Patient was seen in consultation today for tunneled catheter with port placement at the request of [name], NP  Referring Physician(s): [name], NP  Supervising Physician: Ruthann Cancer  Patient Status: [facility] [place] - In-pt  History of Present Illness: Austin Oliver is a 66 y.o. male w/ PMH of cirrhosis of liver, COPD, CAD, HTN, GERD, nephrolithiasis, HLD, OSA on CPAP, pancytopenia and SSS s/p pacemaker placement. Pt presented to ED c/o weakness and paraesthesias found to have spinal infarct. Pt had liver lesion biopsy 04/07/22 that was positive for high grade B-cell lymphoma. [name], NP, has referred pt to IR for tunneled catheter with port placement with plan to transport to [facility] for urgent chemotherapy and then back to CIR.    Past Medical History:  Diagnosis Date   Cirrhosis of liver ([place])    COPD (chronic obstructive pulmonary disease) ([place])    Coronary artery calcification seen on CAT scan 01/14/2017   Essential hypertension 09/11/2020   GERD (gastroesophageal reflux disease)    History of kidney stones    Hyperlipidemia    Hypothyroidism    OSA on CPAP    Mild with AHI 7.8/hr >>on CPAP   Osteoarthritis    Presence of permanent cardiac pacemaker    Shingles 2012    Past Surgical History:  Procedure Laterality Date   CORONARY STENT INTERVENTION N/A 08/24/2020   Procedure: CORONARY STENT INTERVENTION;  Surgeon: [name], MD;  Location: [place] [facility];  Service: Cardiovascular;  Laterality: N/A;   HEEL SPUR SURGERY Left 80's   INTRAVASCULAR PRESSURE WIRE/FFR STUDY N/A 04/25/2021   Procedure: INTRAVASCULAR PRESSURE WIRE/FFR STUDY;  Surgeon: [name], MD;  Location: [place] [facility];  Service: Cardiovascular;  Laterality: N/A;   INTRAVASCULAR ULTRASOUND/IVUS N/A 08/24/2020   Procedure: Intravascular Ultrasound/IVUS;  Surgeon: [name], MD;  Location: [place] [facility];  Service: Cardiovascular;  Laterality: N/A;    IR US GUIDE BX ASP/DRAIN  04/07/2022   KNEE ARTHROPLASTY Right 10/31/2021   Procedure: COMPUTER ASSISTED TOTAL KNEE ARTHROPLASTY;  Surgeon: Rod Can, MD;  Location: [facility];  Service: Orthopedics;  Laterality: Right;   KNEE SURGERY Bilateral    numerous times   LEFT HEART CATH AND CORONARY ANGIOGRAPHY N/A 08/24/2020   Procedure: LEFT HEART CATH AND CORONARY ANGIOGRAPHY;  Surgeon: [name], MD;  Location: [place] [facility];  Service: Cardiovascular;  Laterality: N/A;   LEFT HEART CATH AND CORONARY ANGIOGRAPHY N/A 04/25/2021   Procedure: LEFT HEART CATH AND CORONARY ANGIOGRAPHY;  Surgeon: [name], MD;  Location: [place] [facility];  Service: Cardiovascular;  Laterality: N/A;   NECK SURGERY  70's   PACEMAKER IMPLANT N/A 12/04/2020   Procedure: PACEMAKER IMPLANT;  Surgeon: [name], MD;  Location: [place] [facility];  Service: Cardiovascular;  Laterality: N/A;   TOTAL HIP ARTHROPLASTY Left 03/09/2017   Procedure: LEFT TOTAL HIP ARTHROPLASTY ANTERIOR APPROACH;  Surgeon: Rod Can, MD;  Location: [place];  Service: Orthopedics;  Laterality: Left;  Dr. requesting RNFA    Allergies: Bee venom, Cleocin [clindamycin hcl], and Statins  Medications: Prior to Admission medications   Medication Sig Start Date End Date Taking? Authorizing Provider  acetaminophen (TYLENOL) 325 MG tablet Take 1-2 tablets (325-650 mg total) by mouth every 6 (six) hours as needed for mild pain (pain score 1-3 or temp > 100.5). 11/01/21   Dorothyann Peng, PA-C  albuterol (VENTOLIN HFA) 108 (90 Base) MCG/ACT inhaler Inhale 2 puffs into the lungs every 6 (  six) hours as needed for shortness of breath or wheezing. Patient not taking: Reported on 03/25/2022 05/22/21   [provider]  aspirin EC 81 MG tablet Take 1 tablet (81 mg total) by mouth every evening. Swallow whole. 04/08/22   Lavina Hamman, MD  blood glucose meter kit and supplies KIT Dispense based on patient and insurance  preference. Use up to four times daily as directed. 11/23/21   Florencia Reasons, MD  cholecalciferol (VITAMIN D3) 25 MCG (1000 UNIT) tablet Take 1,000 Units by mouth daily.    [provider]  clopidogrel (PLAVIX) 75 MG tablet Take 1 tablet (75 mg total) by mouth daily. 04/08/22   Lavina Hamman, MD  ezetimibe (ZETIA) 10 MG tablet Take 10 mg by mouth in the morning.    [provider]  feeding supplement, GLUCERNA SHAKE, (GLUCERNA SHAKE) LIQD Take 237 mLs by mouth 2 (two) times daily between meals. 04/05/22   Lavina Hamman, MD  finasteride (PROSCAR) 5 MG tablet Take 5 mg by mouth at bedtime. 06/13/21   [provider]  furosemide (LASIX) 20 MG tablet Take 1 tablet (20 mg total) by mouth daily as needed for fluid or edema. Patient not taking: Reported on 03/25/2022 03/18/22   Domenic Polite, MD  gabapentin (NEURONTIN) 100 MG capsule Take 200 mg by mouth at bedtime.    [provider]  levothyroxine (SYNTHROID) 112 MCG tablet Take 112 mcg by mouth daily before breakfast.    [provider]  nitroGLYCERIN (NITROSTAT) 0.4 MG SL tablet Place 1 tablet (0.4 mg total) under the tongue every 5 (five) minutes x 3 doses as needed for chest pain. 08/27/20   Chandrasekhar, Mahesh A, MD  pantoprazole (PROTONIX) 40 MG tablet Take 40 mg by mouth daily after supper.     [provider]  polyethylene glycol (MIRALAX / GLYCOLAX) 17 g packet Take 17 g by mouth daily as needed for moderate constipation or mild constipation. 04/04/22   Lavina Hamman, MD  ranolazine (RANEXA) 1000 MG SR tablet TAKE 1  BY MOUTH TWICE DAILY Patient taking differently: Take 1,000 mg by mouth 2 (two) times daily. 10/09/21   Chandrasekhar, Lyda Kalata A, MD  rosuvastatin (CRESTOR) 5 MG tablet Take 1 tablet (5 mg total) by mouth daily. 12/27/21   [name], MD  shark liver oil-cocoa butter (PREPARATION H) 0.25-3-85.5 % suppository Place 1 suppository rectally daily as needed for hemorrhoids.     [provider]  tamsulosin (FLOMAX) 0.4 MG CAPS capsule Take 0.8 mg by mouth daily with supper. 10/02/20   [provider]  Tiotropium Bromide Monohydrate (SPIRIVA RESPIMAT) 2.5 MCG/ACT AERS Inhale 2 puffs into the lungs daily. 01/15/22   Laurin Coder, MD  vitamin B-12 (CYANOCOBALAMIN) 1000 MCG tablet Take 1,000 mcg by mouth in the morning.    [provider]     Family History  Problem Relation Age of Onset   Dementia Mother    Heart Problems Father    COPD Father    Lung cancer Father     Social History   Socioeconomic History   Marital status: Married    Spouse name: [name]   Number of children: 2   Years of education: Not on file   Highest education level: Not on file  Occupational History   Occupation: Animator  Tobacco Use   Smoking status: Former    Packs/day: 2.00    Years: 45.00    Pack years: 90.00  Types: Cigarettes    Quit date: 12/05/2016    Years since quitting: 5.3   Smokeless tobacco: Never  Vaping Use   Vaping Use: Never used  Substance and Sexual Activity   Alcohol use: No   Drug use: No   Sexual activity: Not on file  Other Topics Concern   Not on file  Social History Narrative   Not on file   Social Determinants of Health   Financial Resource Strain: Not on file  Food Insecurity: Not on file  Transportation Needs: Not on file  Physical Activity: Not on file  Stress: Not on file  Social Connections: Not on file    Review of Systems: A 12 point ROS discussed and pertinent positives are indicated in the HPI above.  All other systems are negative.  Review of Systems  Constitutional:  Negative for chills and fever.  Respiratory:  Positive for wheezing. Negative for shortness of breath.   Cardiovascular:  Negative for chest pain.  Gastrointestinal:  Positive for nausea and vomiting.  Musculoskeletal:  Positive for back pain.  Neurological:  Positive for weakness and numbness. Negative for  dizziness and headaches.  Hematological:  Bruises/bleeds easily.   Vital Signs: BP 112/70 (BP Location: Right Arm)   Pulse 60   Temp 97.8 F (36.6 C) (Oral)   Resp 18   Ht _0  (1.753 m)   Wt 203 lb 0.7 oz (92.1 kg)   SpO2 96%   BMI 29.98 kg/m   Physical Exam Constitutional:      General: He is not in acute distress.    Appearance: He is ill-appearing.  HENT:     Head: Normocephalic and atraumatic.     Mouth/Throat:     Mouth: Mucous membranes are moist.     Pharynx: Oropharynx is clear.  Eyes:     Extraocular Movements: Extraocular movements intact.     Pupils: Pupils are equal, round, and reactive to light.  Cardiovascular:     Rate and Rhythm: Normal rate and regular rhythm.     Pulses: Normal pulses.     Heart sounds: Normal heart sounds.     Comments: Pacemaker to L upper chest Pulmonary:     Effort: Pulmonary effort is normal. No respiratory distress.     Breath sounds: Normal breath sounds.  Abdominal:     General: Bowel sounds are normal. There is no distension.  Musculoskeletal:     Right lower leg: Edema present.     Left lower leg: Edema present.  Skin:    General: Skin is warm and dry.  Neurological:     Mental Status: He is alert and oriented to person, place, and time.  Psychiatric:        Mood and Affect: Mood normal.        Behavior: Behavior normal.        Thought Content: Thought content normal.        Judgment: Judgment normal.    Imaging: CT ANGIO HEAD NECK W WO CM  Result Date: 03/17/2022 CLINICAL DATA:  Neuro deficit, stroke suspected EXAM: CT ANGIOGRAPHY HEAD AND NECK TECHNIQUE: Multidetector CT imaging of the head and neck was performed using the standard protocol during bolus administration of intravenous contrast. Multiplanar CT image reconstructions and MIPs were obtained to evaluate the vascular anatomy. Carotid stenosis measurements (when applicable) are obtained utilizing NASCET criteria, using the distal internal carotid diameter as  the denominator. RADIATION DOSE REDUCTION: This exam was performed according to the departmental dose-optimization  program which includes automated exposure control, adjustment of the mA and/or kV according to patient size and/or use of iterative reconstruction technique. CONTRAST:  188m OMNIPAQUE IOHEXOL 350 MG/ML SOLN COMPARISON:  CTA 11/20/2021, CT head 03/16/2022. FINDINGS: CT HEAD FINDINGS Brain: No evidence of acute infarction, hemorrhage, cerebral edema, mass, mass effect, or midline shift. No hydrocephalus or extra-axial fluid collection. Hypodensity in the left parietal white matter and splenium of the corpus callosum correlate with infarcts from the 11/20/2021 MRI. Vascular: No hyperdense vessel. Skull: Normal. Negative for fracture or focal lesion. Sinuses/Orbits: Mucosal thickening in the ethmoid air cells and maxillary sinuses. The orbits are unremarkable. Other: The mastoid air cells are well aerated. CTA NECK FINDINGS Aortic arch: Standard branching. Imaged portion shows no evidence of aneurysm or dissection. No significant stenosis of the major arch vessel origins. Right carotid system: No evidence of dissection, stenosis (50% or greater) or occlusion. Left carotid system: No evidence of dissection, stenosis (50% or greater) or occlusion. Redemonstrated approximately 40% stenosis in the proximal left ICA. Vertebral arteries: Moderate narrowing at the origin of the bilateral vertebral arteries, unchanged. No evidence of dissection or occlusion. Skeleton: Redemonstrated degenerative findings in the cervical spine, with osseous fusion of C5 and C6. No acute osseous abnormality. Other neck: Negative. Upper chest: Focal pulmonary opacity or pleural effusion. Redemonstrated left chest cardiac device with leads extending off the inferior field of view. Review of the MIP images confirms the above findings CTA HEAD FINDINGS Anterior circulation: Both internal carotid arteries are patent to the termini,  without significant stenosis. A1 segments patent. Normal anterior communicating artery. Anterior cerebral arteries are patent to their distal aspects, with a hypoplastic right ACA. No M1 stenosis or occlusion. Normal MCA bifurcations. Distal MCA branches perfused and symmetric. Posterior circulation: Vertebral arteries patent to the vertebrobasilar junction without stenosis. Posterior inferior cerebral arteries patent bilaterally. Basilar patent to its distal aspect. Superior cerebellar arteries patent bilaterally. Patent P1 segments. Redemonstrated severe stenosis and near occlusion of the left PCA in the proximal P2 segment (series 11, image 111), with poor perfusion of the left PCA distally. Right PCA branches are perfused to their distal aspects without focal stenosis. The bilateral posterior communicating arteries are not visualized. Venous sinuses: As permitted by contrast timing, patent. Redemonstrated hypoplastic right transverse sinus, with dominant left transverse sinus Anatomic variants: None significant. Review of the MIP images confirms the above findings IMPRESSION: 1.  No acute intracranial process. 2. No intracranial large vessel occlusion. Unchanged severe stenosis in the proximal left P2 segment with poor perfusion of the remainder of the left PCA. 3. 40% stenosis in the proximal left ICA, unchanged. 4. Moderate stenosis of the origin of the bilateral vertebral arteries, which are otherwise patent. Electronically Signed   By: AMerilyn BabaM.D.   On: 03/17/2022 01:46   DG Thoracic Spine 2 View  Result Date: 03/25/2022 CLINICAL DATA:  Back pain EXAM: THORACIC SPINE 2 VIEWS COMPARISON:  None Available. FINDINGS: No recent fracture is seen. Alignment of posterior margins of vertebral bodies is unremarkable. There is fusion of bodies of C5 and C6 vertebrae. Severe degenerative changes are noted in the lower cervical spine at C6-C7 level. Small bony spurs seen in the lower thoracic spine. Paraspinal  soft tissues are unremarkable. Pacemaker battery is seen in the left infraclavicular region. IMPRESSION: No recent fracture is seen. Degenerative changes are noted in the lower cervical spine and thoracic spine. Electronically Signed   By: PElmer PickerM.D.   On: 03/25/2022 20:23  DG Lumbar Spine 2-3 Views  Result Date: 03/25/2022 CLINICAL DATA:  Back pain EXAM: LUMBAR SPINE - 3 VIEW COMPARISON:  Radiographs and MRI done on 11/20/2021 FINDINGS: Last lumbar vertebra is transitional suggesting sacralization of L5 vertebra. No recent fracture is seen. Alignment of posterior margins of vertebral bodies is unremarkable. Degenerative changes are noted in lumbar spine with bony spurs and facet hypertrophy, more severe at L4-L5 level. There is previous left hip arthroplasty. There is 14 mm calcific density overlying the right kidney. IMPRESSION: No recent fracture is seen.  Degenerative changes are noted. There is 14 mm calcific density overlying the right kidney suggesting right renal stone. Electronically Signed   By: Elmer Picker M.D.   On: 03/25/2022 20:21   DG Abd 1 View  Result Date: 04/12/2022 CLINICAL DATA:  Nausea and vomiting. EXAM: ABDOMEN - 1 VIEW COMPARISON:  04/06/2022 FINDINGS: Bowel gas pattern appears nonobstructed. No dilated loops of small bowel identified. Right renal stone is again noted within the expected location of the inferior pole measuring 1.1 cm. Status post left hip arthroplasty. IMPRESSION: 1. Nonobstructive bowel gas pattern. 2. Right renal stone. Electronically Signed   By: Kerby Moors M.D.   On: 04/12/2022 11:23   DG Abd 1 View  Result Date: 04/06/2022 CLINICAL DATA:  Abdominal pain EXAM: ABDOMEN - 1 VIEW COMPARISON:  04/04/2022 FINDINGS: There remains residual contrast within the rectosigmoid. Stool is present throughout the colon. No dilated loops. No acute osseous abnormality. Left hip arthroplasty. IMPRESSION: Persistent evidence of constipation.  Electronically Signed   By: Macy Mis M.D.   On: 04/06/2022 13:02   CT HEAD WO CONTRAST (5MM)  Result Date: 04/09/2022 CLINICAL DATA:  Mental status change.  History of stroke. EXAM: CT HEAD WITHOUT CONTRAST TECHNIQUE: Contiguous axial images were obtained from the base of the skull through the vertex without intravenous contrast. RADIATION DOSE REDUCTION: This exam was performed according to the departmental dose-optimization program which includes automated exposure control, adjustment of the mA and/or kV according to patient size and/or use of iterative reconstruction technique. COMPARISON:  CT head 03/25/2022.  MRI head 03/27/2022 FINDINGS: Brain: Patchy hypodensities in the cerebral white matter bilaterally, left greater than right are stable. No new area of infarct, hemorrhage, mass Vascular: Negative for hyperdense vessel Skull: Negative Sinuses/Orbits: Mild mucosal edema paranasal sinuses. Negative orbit Other: None IMPRESSION: No acute abnormality and no change from recent studies. Bilateral white matter hypodensities left greater than right are stable. Electronically Signed   By: Franchot Gallo M.D.   On: 04/09/2022 17:09   CT HEAD WO CONTRAST (5MM)  Result Date: 03/25/2022 CLINICAL DATA:  Neuro deficit, acute, stroke suspected worsening R leg weakness, hx of recurrent strokes EXAM: CT HEAD WITHOUT CONTRAST TECHNIQUE: Contiguous axial images were obtained from the base of the skull through the vertex without intravenous contrast. RADIATION DOSE REDUCTION: This exam was performed according to the departmental dose-optimization program which includes automated exposure control, adjustment of the mA and/or kV according to patient size and/or use of iterative reconstruction technique. COMPARISON:  MRI head 03/17/2022, CT 03/16/2022 FINDINGS: Brain: No evidence of acute intracranial hemorrhage or extra-axial collection.No evidence of mass lesion/concerning mass effect.The ventricles are normal in  size.There is periventricular and subcortical white matter hypoattenuation, nonspecific and similar in distribution to recent MRI. Vascular: No hyperdense vessel. Skull: Negative for skull fracture. Sinuses/Orbits: Mild ethmoid air cell mucosal thickening. Other: None. IMPRESSION: No new acute findings on noncontrast CT. Similar distribution of white matter disease in comparison  to recent MRI, nonspecific but suggestive of demyelinating process and/or microvascular ischemic disease. Electronically Signed   By: Maurine Simmering M.D.   On: 03/25/2022 15:54   MR BRAIN WO CONTRAST  Result Date: 03/17/2022 CLINICAL DATA:  Neuro deficit, acute, stroke suspected. EXAM: MRI HEAD WITHOUT CONTRAST TECHNIQUE: Multiplanar, multiecho pulse sequences of the brain and surrounding structures were obtained without intravenous contrast. COMPARISON:  CT studies done yesterday and today.  MRI 11/20/2021. FINDINGS: Brain: There are multiple newly seen foci of abnormal T2 and FLAIR signal and restricted diffusion present within the white matter of the cerebral hemispheres and to a lesser extent the cerebellum. Findings are more consistent with active demyelinating disease rather than ischemic infarctions. Consideration could be given to multiple etiologies of demyelination including multiple sclerosis, toxic demyelination and autoimmune encephalitis. No evidence of hemorrhage, hydrocephalus or extra-axial collection. Vascular: Major vessels at the base of the brain show flow. Skull and upper cervical spine: Negative Sinuses/Orbits: Mucosal inflammatory changes of the maxillary sinuses. Orbits negative. Other: None IMPRESSION: Progression of white matter disease since the study of 11/20/2021 with a pattern suggesting a demyelinating process rather than ischemic infarctions. Consider multiple sclerosis, toxic demyelination syndromes and autoimmune encephalitis. Electronically Signed   By: Nelson Chimes M.D.   On: 03/17/2022 16:04   MR BRAIN  W WO CONTRAST  Result Date: 03/27/2022 CLINICAL DATA:  Acute neuro deficit. Rule out stroke or demyelinating disease. EXAM: MRI HEAD WITHOUT AND WITH CONTRAST TECHNIQUE: Multiplanar, multiecho pulse sequences of the brain and surrounding structures were obtained without and with intravenous contrast. CONTRAST:  80m GADAVIST GADOBUTROL 1 MMOL/ML IV SOLN COMPARISON:  MRI head 03/17/2022, 11/20/2021 FINDINGS: Brain: Bilateral white matter hyperintensities are stable since most recent study but appear progressive since 11/20/2021. Lesions are most prominent in the left frontal and parietal white matter with several lesions perpendicular to the ventricular surface. Deep white matter lesions on the right extending to the right frontal horn also show mild progression since 11/20/2021. Lesion in the right brachium pontis unchanged. Many these lesions show increased signal on diffusion-weighted imaging compatible with T2 shine through. Involvement of the splenium unchanged. T1 shortening with T1 hyperintensity in the left occipital cortex on axial image 32 is unchanged the prior study. Likely an area of chronic hemorrhage. 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 This is not seen on the prior studies as contrast not administered. No compression of the optic chiasm. Vascular: Normal arterial flow voids Skull and upper cervical spine: Negative Sinuses/Orbits: Mucosal edema paranasal sinuses.  Negative orbit Other: None IMPRESSION: White matter changes are similar in extent to the most recent MRI but progressive since 11/20/2021. Favor demyelinating disease over ischemia for most of these lesions 5 mm hypoenhancing nodule in the anterior pituitary. 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 Correlate with pituitary function tests. Electronically Signed   By: CFranchot GalloM.D.   On: 03/27/2022 16:01   MR CERVICAL SPINE W WO CONTRAST  Result Date: 03/27/2022 CLINICAL DATA:  Cervical myelopathy.  Possible CSF  leak. EXAM: MRI CERVICAL SPINE WITHOUT AND WITH CONTRAST TECHNIQUE: Multiplanar and multiecho pulse sequences of the cervical spine, to include the craniocervical junction and cervicothoracic junction, were obtained without and with intravenous contrast. CONTRAST:  12mGADAVIST GADOBUTROL 1 MMOL/ML IV SOLN COMPARISON:  Radiographs 08/11/2018 FINDINGS: Alignment: Straightening of the normal cervical lordosis Elise in part due to a interbody fusion at C5-6. The vertebral bodies are otherwise normally aligned. Vertebrae: No bone lesions or fractures. Cord: Normal cervical  cord signal intensity. No cord lesions or syrinx. At the very bottom of the sagittal images there appears to be some signal abnormality in the thoracic cord at the T3-4 level. This also could be artifact but a thoracic spine MRI may be helpful for further evaluation depending on the clinical situation. Posterior Fossa, vertebral arteries, paraspinal tissues: No significant findings. The visualized brainstem and cerebellum appear unremarkable. No Chiari malformation. Disc levels: C2-3: No significant findings. C3-4: Bilateral facet disease and mild uncinate spurring changes contributing to mild foraminal narrowing. C4-5: Bulging annulus, osteophytic ridging, uncinate spurring and facet disease. There is flattening of the ventral thecal sac and narrowing the ventral CSF space but no significant spinal stenosis. There is moderate left foraminal stenosis. No right-sided foraminal stenosis. C5-6: Interbody fusion changes. Mild posterior osteophytic ridging. No significant spinal or foraminal stenosis. C6-7: Degenerative disc disease with bulging annulus, osteophytic ridging, uncinate spurring and facet disease. This contributes to mild spinal and moderate left foraminal stenosis. C7-T1: No significant findings. IMPRESSION: 1. Interbody fusion changes at I4-5 without complicating features. No spinal or foraminal stenosis. 2. Mild spinal and moderate left  foraminal stenosis at C4-5. 3. Mild spinal and moderate left foraminal stenosis at C6-7. 4. Possible signal abnormality in the thoracic cord at the very bottom of the sagittal images at the T3-4 level. This could be artifact but a thoracic spine MRI may be helpful for further evaluation depending on the clinical situation. Electronically Signed   By: Marijo Sanes M.D.   On: 03/27/2022 16:03   MR THORACIC SPINE W WO CONTRAST  Result Date: 04/12/2022 CLINICAL DATA:  Spinal cord injury. Follow-up. Worsening weakness and numbness in lower extremities. Known cancer. Known spinal cord infarct. EXAM: MRI THORACIC WITHOUT AND WITH CONTRAST TECHNIQUE: Multiplanar and multiecho pulse sequences of the thoracic spine were obtained without and with intravenous contrast. This patient has a conditional pacemaker and was scanned according to safety guidelines. CONTRAST:  9.76m GADAVIST GADOBUTROL 1 MMOL/ML IV SOLN COMPARISON:  CT chest, abdomen, and pelvis 03/29/2022; MRI thoracic spine 03/28/2022 FINDINGS: Alignment:  No sagittal spondylolisthesis. Vertebrae: Vertebral body heights are maintained. There is again abnormal decreased T1 increased T2/stir signal and diffuse enhancement seen throughout the majority of the T4 vertebral body. There also appears to be decreased T1 and increased T2/STIR signal with enhancement within the C7 and T1 vertebral bodies, similar to 03/28/2022 MRI. Small fat intensity hemangioma within the T8 vertebral body is unchanged. Cord: There is again abnormal increased T2/STIR signal within the T3-4 through T4-5 levels of the cord. This may be slightly improved from prior. The cord now appears to be normal in caliber with resolution of the prior possible minimal enlargement. No definite cord enhancement is seen. The conus terminates at the superior L1 level. Paraspinal and other soft tissues: There is again thickening and nodularity within the bilateral adrenal glands. Partially visualized increased  T2 signal lesion within the posterior right hepatic lobe likely corresponding to one of numerable lesion seen on 03/29/2022 CT.11 mm short axis lymph node is seen to the right of the descending thoracic aorta (axial series 22, image 20). Given the diffuse lymphadenopathy on prior CT this also remains concerning for malignant involvement. Disc levels: C6-7: Partially visualized on sagittal images only. Mild-to-moderate posterior disc osteophyte complex. T3-4: Mild midline posterior disc protrusion with mild cephalad extension unchanged. No central canal or neuroforaminal stenosis. T4-5: Facet joint hypertrophy contributes to mild bilateral neuroforaminal stenosis. T5-6: Mild broad-based posterior disc bulge with mild left intraforaminal  extension and mild-to-moderate left neuroforaminal stenosis, unchanged. T9-10: Minimal posterior disc bulge with mild bilateral intraforaminal extension. Mild bilateral facet joint hypertrophy. Mild bilateral neuroforaminal stenosis. T10-11: Facet joint hypertrophy contributes to mild right neuroforaminal stenosis. IMPRESSION: 1. There is again abnormal increased T2 signal within the central aspect of the cord fairly diffusely at the T3 and T4 levels. There appears to be resolution of the prior mild cord swelling/enlargement. Findings again may be secondary to cord ischemia or transverse myelitis. Again no definite enhancement is seen to suggest a neoplastic process. 2. There is again abnormal decreased T1 increased T2/STIR signal with enhancement within the C7, T1, and T4 vertebral bodies that is suspicious for metastatic disease. Note is made metastases were suggested within the liver, adrenals, and abdominal lymph nodes on 03/29/2022 CT. Electronically Signed   By: Yvonne Kendall M.D.   On: 04/12/2022 18:50   MR THORACIC SPINE W WO CONTRAST  Result Date: 03/28/2022 CLINICAL DATA:  Acute myelopathy. EXAM: MRI THORACIC AND LUMBAR SPINE WITHOUT AND WITH CONTRAST TECHNIQUE:  Multiplanar and multiecho pulse sequences of the thoracic and lumbar spine were obtained without and with intravenous contrast. CONTRAST:  9.53m GADAVIST GADOBUTROL 1 MMOL/ML IV SOLN COMPARISON:  MRI cervical spine without contrast. FINDINGS: MRI THORACIC SPINE FINDINGS Alignment:  Normal Vertebrae: Abnormal T1 and T2 signal intensity in the T4 vertebral body along with contrast enhancement. Possible T1 signal abnormality in the T1 vertebral body also. Could not exclude metastatic disease. Patient may metastatic workup. Cord: Cord signal abnormality in the upper thoracic cord extending from the T3-4 disc space to the bottom of T5. Cord is also slightly thickened in this area. I do not see any definite evidence of hemorrhage. No obvious enhancement to suggest a neoplastic process. This could be ischemic change or possibly transverse myelitis. The remainder of the thoracic cord is. Conus medullaris terminates at L1. Paraspinal and other soft tissues: No obvious lung mass or mediastinal/hilar mass or adenopathy. The aorta is grossly normal. In the upper abdomen there appear to be bilateral adrenal gland lesions and a very prominent caudate lobe of the liver which could suggest cirrhosis. Disc levels: No focal thoracic disc protrusions, spinal or foraminal stenosis. MRI LUMBAR SPINE FINDINGS Segmentation: There is partial sacralization of L5 with small disc space. Alignment:  Normal Vertebrae: Abnormal T1 signal intensity posterior aspect L3 and L4 and and most of the L5 and S1 vertebral bodies. Contrast enhancement is also demonstrated. Conus medullaris: Extends to the L1 level and appears normal. Paraspinal and other soft tissues: Borderline retroperitoneal lymph nodes. Bilateral adrenal lesions. No aortic aneurysm. Disc levels: No significant disc protrusions, spinal or foraminal stenosis. There is a bulging degenerated annulus at L4-5 along with a shallow central disc protrusion. Moderate multilevel facet disease.  IMPRESSION: 1. Abnormal cord signal intensity at the T3 and T4 levels. Findings suggest cord ischemia or other process such as transverse myelitis. No enhancement to suggest neoplastic process. 2. There are several areas of abnormal T1 marrow signal and contrast enhancement worrisome for metastatic disease or lymphoma. Patient may need metastatic workup. There are also bilateral adrenal gland lesions and borderline retroperitoneal lymph nodes. 3. Electronically Signed   By: PMarijo SanesM.D.   On: 03/28/2022 18:13   MR Lumbar Spine W Wo Contrast  Result Date: 04/12/2022 CLINICAL DATA:  Spinal cord injury. Follow-up. Worsening numbness and weakness. EXAM: MRI LUMBAR SPINE WITHOUT AND WITH CONTRAST TECHNIQUE: Multiplanar and multiecho pulse sequences of the lumbar spine were obtained without and  with intravenous contrast. Patient has conditional pacemaker and was scanned according to safety guidelines. CONTRAST:  9.14m GADAVIST GADOBUTROL 1 MMOL/ML IV SOLN COMPARISON:  MRI lumbar spine 03/28/2022; CT chest, abdomen, and pelvis 03/29/2022 FINDINGS: Segmentation: There is again partial sacralization of L5. Rudimentary disc at L5-S1. Alignment:  Trace retrolisthesis of L4 on L5, unchanged. Vertebrae: Vertebral body heights are maintained. Mild superior L2 endplate degenerative Schmorl's node. There is again decreased T1 signal and subtle increased T2/stir signal and possible enhancement seen within the posterior L3 and L4 vertebral bodies and more diffusely within the L5 and S1 vertebral bodies. Conus medullaris and cauda equina: Conus extends to the superior L1 level. Conus and cauda equina appear normal. Paraspinal and other soft tissues: There again borderline enlarged para-aortic retroperitoneal lymph nodes. A right lower pole renal 11 mm likely cyst with layering calcification is seen, as on prior CT. Borderline infrarenal abdominal aortic aneurysm. Disc levels: L1-2: Mild bilateral facet joint hypertrophy.  Mild broad-based posterior disc osteophyte complex with mild bilateral intraforaminal extension. Mild-to-moderate bilateral neuroforaminal stenosis. L2-3: Mild bilateral facet joint hypertrophy. Mild-to-moderate broad-based posterior disc osteophyte complex. Mild-to-moderate bilateral neuroforaminal stenosis. L3-4: Mild bilateral facet joint hypertrophy. Mild to moderate broad-based posterior disc osteophyte complex. Moderate left and mild right neuroforaminal stenosis. Mild narrowing of the lateral recesses and mild central canal stenosis. L4-5: Mild-to-moderate bilateral facet joint hypertrophy. Posterior disc bulge with midline posterior disc protrusion. No significant central canal stenosis. No significant neuroforaminal stenosis. L5-S1: Rudimentary disc. No posterior disc bulge, central canal narrowing, or neuroforaminal stenosis. IMPRESSION: 1. No significant change from 03/28/2022. 2. Probable bone marrow signal abnormality within the L3 through S1 vertebral bodies, concerning for metastatic disease. 3. Multilevel degenerative disc and joint changes as above. Electronically Signed   By: RYvonne KendallM.D.   On: 04/12/2022 19:03   MR Lumbar Spine W Wo Contrast  Result Date: 03/28/2022 CLINICAL DATA:  Acute myelopathy. EXAM: MRI THORACIC AND LUMBAR SPINE WITHOUT AND WITH CONTRAST TECHNIQUE: Multiplanar and multiecho pulse sequences of the thoracic and lumbar spine were obtained without and with intravenous contrast. CONTRAST:  9.572mGADAVIST GADOBUTROL 1 MMOL/ML IV SOLN COMPARISON:  MRI cervical spine without contrast. FINDINGS: MRI THORACIC SPINE FINDINGS Alignment:  Normal Vertebrae: Abnormal T1 and T2 signal intensity in the T4 vertebral body along with contrast enhancement. Possible T1 signal abnormality in the T1 vertebral body also. Could not exclude metastatic disease. Patient may metastatic workup. Cord: Cord signal abnormality in the upper thoracic cord extending from the T3-4 disc space to the  bottom of T5. Cord is also slightly thickened in this area. I do not see any definite evidence of hemorrhage. No obvious enhancement to suggest a neoplastic process. This could be ischemic change or possibly transverse myelitis. The remainder of the thoracic cord is. Conus medullaris terminates at L1. Paraspinal and other soft tissues: No obvious lung mass or mediastinal/hilar mass or adenopathy. The aorta is grossly normal. In the upper abdomen there appear to be bilateral adrenal gland lesions and a very prominent caudate lobe of the liver which could suggest cirrhosis. Disc levels: No focal thoracic disc protrusions, spinal or foraminal stenosis. MRI LUMBAR SPINE FINDINGS Segmentation: There is partial sacralization of L5 with small disc space. Alignment:  Normal Vertebrae: Abnormal T1 signal intensity posterior aspect L3 and L4 and and most of the L5 and S1 vertebral bodies. Contrast enhancement is also demonstrated. Conus medullaris: Extends to the L1 level and appears normal. Paraspinal and other soft tissues: Borderline retroperitoneal lymph  nodes. Bilateral adrenal lesions. No aortic aneurysm. Disc levels: No significant disc protrusions, spinal or foraminal stenosis. There is a bulging degenerated annulus at L4-5 along with a shallow central disc protrusion. Moderate multilevel facet disease. IMPRESSION: 1. Abnormal cord signal intensity at the T3 and T4 levels. Findings suggest cord ischemia or other process such as transverse myelitis. No enhancement to suggest neoplastic process. 2. There are several areas of abnormal T1 marrow signal and contrast enhancement worrisome for metastatic disease or lymphoma. Patient may need metastatic workup. There are also bilateral adrenal gland lesions and borderline retroperitoneal lymph nodes. 3. Electronically Signed   By: Marijo Sanes M.D.   On: 03/28/2022 18:13   CT CHEST ABDOMEN PELVIS W CONTRAST  Result Date: 03/29/2022 CLINICAL DATA:  Possible metastatic  disease within the thoracolumbar spine EXAM: CT CHEST, ABDOMEN, AND PELVIS WITH CONTRAST TECHNIQUE: Multidetector CT imaging of the chest, abdomen and pelvis was performed following the standard protocol during bolus administration of intravenous contrast. RADIATION DOSE REDUCTION: This exam was performed according to the departmental dose-optimization program which includes automated exposure control, adjustment of the mA and/or kV according to patient size and/or use of iterative reconstruction technique. CONTRAST:  156m OMNIPAQUE IOHEXOL 300 MG/ML  SOLN COMPARISON:  03/27/2022, 03/28/2022, 10/08/2021 FINDINGS: CT CHEST FINDINGS Cardiovascular: The heart and great vessels are unremarkable without pericardial effusion. Dual lead pacer unchanged. No evidence of thoracic aortic aneurysm or dissection. Stable atherosclerosis of the aorta and coronary vasculature. Mediastinum/Nodes: No enlarged mediastinal, hilar, or axillary lymph nodes. Thyroid gland, trachea, and esophagus demonstrate no significant findings. Lungs/Pleura: Bilateral partially calcified less than 4 mm pulmonary nodules are unchanged since prior study consistent with granulomas. No new pulmonary nodules or masses. No acute airspace disease, effusion, or pneumothorax. Central airways are patent. Musculoskeletal: There are no acute displaced fractures. The abnormality of the T4 vertebral body seen on prior thoracic spine MRI is not apparent by CT. No destructive bony lesions. Reconstructed images demonstrate no additional findings. CT ABDOMEN PELVIS FINDINGS Hepatobiliary: There are multiple hypodense lesions seen throughout the liver, consistent with metastatic disease. Index lesion right lobe image 50/3 measures 2.0 x 1.6 cm. No biliary duct dilation. Small gallstone within the gallbladder neck without evidence of acute cholecystitis. Pancreas: Unremarkable. No pancreatic ductal dilatation or surrounding inflammatory changes. Spleen: Spleen is  enlarged measuring 15.7 by 15.6 x 5.8 cm. The splenic parenchyma is heterogeneous, with multiple hypodensities measuring up to 1.3 cm reference image 62. Adrenals/Urinary Tract: Bilateral adrenal thickening, measuring up to 1.3 cm on the right and 1.5 cm on the left, suspicious for metastatic disease given the additional findings. The kidneys enhance normally and symmetrically. Nonobstructing 13 mm calculus mid right kidney. No obstructive uropathy within either kidney. Bladder is only minimally distended, with nonspecific bladder wall thickening. Foley catheter is identified, with small amount of gas in the bladder lumen consistent with catheterization. Stomach/Bowel: No bowel obstruction or ileus. Normal appendix right lower quadrant. No bowel wall thickening or inflammatory change. Vascular/Lymphatic: Multiple enlarged lymph nodes within the upper abdomen and retroperitoneum. 14 mm short axis lymph node within the gastrohepatic ligament image 57/3. Retroperitoneal adenopathy, with index lymph node measuring 13 mm in short axis in the aortocaval region image 80/3. 3.3 cm infrarenal abdominal aortic aneurysm. Moderate atherosclerosis throughout the aorta and its branches. Reproductive: Prostate is unremarkable. Other: No free fluid or free intraperitoneal gas. No abdominal wall hernia. Musculoskeletal: Unremarkable left hip arthroplasty. 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  There are no acute displaced fractures. The abnormalities of the L3 through S1 vertebral body seen on prior MRI are not readily apparent by CT. No destructive bony lesions are identified. Reconstructed images demonstrate no additional findings. IMPRESSION: 1. The suspected metastatic disease within the T4 vertebral body and L3 through S1 vertebral bodies on prior MRI is occult by CT. No destructive bony lesions. 2. Numerous hypodensities throughout the liver consistent with metastatic disease. 3.  Splenomegaly, with indeterminate hypodensities consistent with metastases. 4. Bilateral adrenal thickening, suspicious for adrenal metastases. 5. Diffuse lymphadenopathy within the upper abdomen and retroperitoneum, consistent with metastatic disease or lymphoma. 6. 3.3 cm infrarenal abdominal aortic aneurysm. Recommend follow-up every 3 years. Reference: J Am Coll Radiol 6803;21:224-825. 7. No acute intrathoracic process. Bilateral sub 4 mm pulmonary nodules are partially calcified, consistent with benign nodules based on stability and imaging characteristics. 8. 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 Electronically Signed   By: Randa Ngo M.D.   On: 03/29/2022 19:46   IR US Guide Bx Asp/Drain  Result Date: 04/07/2022 INDICATION: Multiple hepatic lesions EXAM: Ultrasound-guided biopsy of left liver mass MEDICATIONS: None. ANESTHESIA/SEDATION: Moderate (conscious) sedation was employed during this procedure. A total of Versed 1 mg and Fentanyl 50 mcg was administered intravenously by the radiology nurse. Total intra-service moderate Sedation Time: 10 minutes. The patient's level of consciousness and vital signs were monitored continuously by radiology nursing throughout the procedure under my direct supervision. COMPLICATIONS: None immediate. PROCEDURE: Informed written consent was obtained from the patient after a thorough discussion of the procedural risks, benefits and alternatives. All questions were addressed. Maximal Sterile Barrier Technique was utilized including caps, mask, sterile gowns, sterile gloves, sterile drape, hand hygiene and skin antiseptic. A timeout was performed prior to the initiation of the procedure. Patient position supine on the ultrasound table. Epigastric skin prepped and draped in usual sterile fashion. Following local lidocaine administration, 17 gauge introducer needle was advanced into 1 of the left hepatic lobe lesions, and 4- 18 gauge cores  were obtained utilizing continuous ultrasound guidance. Gelfoam slurry was administered through the introducer needle at the biopsy site. Samples were sent to pathology in formalin. Needle removed and hemostasis achieved with 5 minutes of manual compression. Post procedure ultrasound images showed no evidence of significant hemorrhage. IMPRESSION: Ultrasound-guided biopsy of left liver mass as above. Electronically Signed   By: Miachel Roux M.D.   On: 04/07/2022 16:20   DG CHEST PORT 1 VIEW  Result Date: 03/25/2022 CLINICAL DATA:  Right-sided weakness EXAM: PORTABLE CHEST 1 VIEW COMPARISON:  11/20/2021 FINDINGS: Lungs are well expanded, symmetric, and clear. No pneumothorax or pleural effusion. Cardiac size within normal limits. 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 Osseous structures are age-appropriate. No acute bone abnormality. IMPRESSION: No active disease. Electronically Signed   By: Fidela Salisbury M.D.   On: 03/25/2022 19:26   DG Knee Right Port  Result Date: 03/25/2022 CLINICAL DATA:  Fall, history of knee replacement EXAM: PORTABLE RIGHT KNEE - 1-2 VIEW COMPARISON:  Knee radiographs 10/31/2021 FINDINGS: Postsurgical changes reflecting right knee arthroplasty are again seen. There is no evidence of acute fracture or dislocation. There is no evidence of hardware related complication. There is a small suprapatellar effusion. IMPRESSION: Acute fracture or dislocation.  Small effusion. Electronically Signed   By: Valetta Mole M.D.   On: 03/25/2022 16:16   DG Abd Portable 1V  Result Date: 04/04/2022 CLINICAL DATA:  Constipation, initial encounter EXAM: PORTABLE  ABDOMEN - 1 VIEW COMPARISON:  12/30/2021 FINDINGS: Previously administered contrast now lies within the left colon. Retained fecal material is seen without obstructive change. No free air is noted. No acute bony abnormality is noted. IMPRESSION: Changes consistent with colonic  constipation. Previously administered contrast remains within the colon 6 days later. Electronically Signed   By: Inez Catalina M.D.   On: 04/04/2022 19:04   EEG adult  Result Date: 03/17/2022 Derek Jack, MD     03/17/2022  7:38 AM Routine EEG Report JONNATHAN BIRMAN is a 66 y.o. male with a history of TIAs who is undergoing an EEG to evaluate for seizures. Report: This EEG was acquired with electrodes placed according to the International 10-20 electrode system (including Fp1, Fp2, F3, F4, C3, C4, P3, P4, O1, O2, T3, T4, T5, T6, A1, A2, Fz, Cz, Pz). The following electrodes were missing or displaced: none. The occipital dominant rhythm was 9-10 Hz. This activity is reactive to stimulation. Drowsiness was manifested by background fragmentation; deeper stages of sleep were not identified. There was no focal slowing. There were no interictal epileptiform discharges. There were no electrographic seizures identified. Photic stimulation and hyperventilation were not performed. Impression and clinical correlation: This EEG was obtained while awake and drowsy and is normal. One episode of right hand shaking was captured which had no clear ictal correlate on EEG. Surface EEG may not pick up small focal motor seizures, therefore if clinical concern for seizures persists consider additional EEG monitoring.   Su Monks, MD [facility] [phone] If 7pm- 7am, please page neurology on call as listed in [place].   ECHOCARDIOGRAM COMPLETE  Result Date: 04/14/2022    ECHOCARDIOGRAM REPORT   Patient Name:   Austin Oliver Date of Exam: 04/14/2022 Medical Rec #:  [id number]      Height:       69.0 in Accession #:    [id number]     Weight:       203.0 lb Date of Birth:  03-Sep-1956      BSA:          2.079 m Patient Age:    2 years       BP:           112/78 mmHg Patient Gender: M              HR:           66 bpm. Exam Location:  Inpatient Procedure: 2D Echo, Cardiac Doppler, Color Doppler and Strain Analysis  Indications:    Chemo  History:        Patient has prior history of Echocardiogram examinations, most                 recent 11/20/2021. COPD; Risk Factors:Hypertension and                 Dyslipidemia. 04/25/21 cath                 12/04/20 pacemaker.  Sonographer:    Luisa Hart RDCS Referring Phys: [place]  1. Left ventricular ejection fraction, by estimation, is 55 to 60%. The left ventricle has normal function. The left ventricle has no regional wall motion abnormalities. Left ventricular diastolic parameters were normal.  2. Right ventricular systolic function is normal. The right ventricular size is normal.  3. The mitral valve is normal in structure. Trivial mitral valve regurgitation.  4. The aortic valve is tricuspid. Aortic valve regurgitation is  trivial. Aortic valve sclerosis is present, with no evidence of aortic valve stenosis. Comparison(s): The left ventricular function is unchanged. FINDINGS  Left Ventricle: Left ventricular ejection fraction, by estimation, is 55 to 60%. The left ventricle has normal function. The left ventricle has no regional wall motion abnormalities. The left ventricular internal cavity size was normal in size. There is  no left ventricular hypertrophy. Left ventricular diastolic parameters were normal. Right Ventricle: The right ventricular size is normal. Right vetricular wall thickness was not assessed. Right ventricular systolic function is normal. Left Atrium: Left atrial size was normal in size. Right Atrium: Right atrial size was normal in size. Pericardium: There is no evidence of pericardial effusion. Mitral Valve: The mitral valve is normal in structure. Trivial mitral valve regurgitation. MV peak gradient, 2.2 mmHg. The mean mitral valve gradient is 1.0 mmHg. Tricuspid Valve: The tricuspid valve is normal in structure. Tricuspid valve regurgitation is trivial. Aortic Valve: The aortic valve is tricuspid. Aortic valve regurgitation is trivial.  Aortic regurgitation PHT measures 636 msec. Aortic valve sclerosis is present, with no evidence of aortic valve stenosis. Aortic valve mean gradient measures 3.3 mmHg. Aortic valve peak gradient measures 6.7 mmHg. Aortic valve area, by VTI measures 3.28 cm. Pulmonic Valve: The pulmonic valve was grossly normal. Pulmonic valve regurgitation is not visualized. Aorta: The aortic root and ascending aorta are structurally normal, with no evidence of dilitation. IAS/Shunts: No atrial level shunt detected by color flow Doppler.  LEFT VENTRICLE PLAX 2D LVIDd:         5.10 cm     Diastology LVIDs:         3.40 cm     LV e' medial:    7.07 cm/s LV PW:         1.00 cm     LV E/e' medial:  7.4 LV IVS:        1.00 cm     LV e' lateral:   14.00 cm/s LVOT diam:     2.20 cm     LV E/e' lateral: 3.7 LV SV:         79 LV SV Index:   38 LVOT Area:     3.80 cm  LV Volumes (MOD) LV vol d, MOD A2C: 42.3 ml LV vol d, MOD A4C: 99.0 ml LV vol s, MOD A2C: 18.9 ml LV vol s, MOD A4C: 39.8 ml LV SV MOD A2C:     23.4 ml LV SV MOD A4C:     99.0 ml LV SV MOD BP:      36.8 ml RIGHT VENTRICLE RV Basal diam:  3.00 cm RV Mid diam:    3.00 cm RV S prime:     13.60 cm/s TAPSE (M-mode): 1.3 cm LEFT ATRIUM           Index        RIGHT ATRIUM           Index LA diam:      3.40 cm 1.64 cm/m   RA Area:     13.60 cm LA Vol (A4C): 31.1 ml 14.96 ml/m  RA Volume:   33.00 ml  15.87 ml/m  AORTIC VALVE                    PULMONIC VALVE AV Area (Vmax):    3.27 cm     PV Vmax:       1.38 m/s AV Area (Vmean):   3.04 cm     PV  Vmean:      87.300 cm/s AV Area (VTI):     3.28 cm     PV VTI:        0.258 m AV Vmax:           129.00 cm/s  PV Peak grad:  7.7 mmHg AV Vmean:          84.400 cm/s  PV Mean grad:  3.5 mmHg AV VTI:            0.241 m AV Peak Grad:      6.7 mmHg AV Mean Grad:      3.3 mmHg LVOT Vmax:         111.00 cm/s LVOT Vmean:        67.450 cm/s LVOT VTI:          0.208 m LVOT/AV VTI ratio: 0.86 AI PHT:            636 msec  AORTA Ao Root diam: 3.50  cm Ao Asc diam:  2.80 cm MITRAL VALVE               TRICUSPID VALVE MV Area (PHT): 3.03 cm    TR Peak grad:   25.0 mmHg MV Area VTI:   3.44 cm    TR Vmax:        250.00 cm/s MV Peak grad:  2.2 mmHg MV Mean grad:  1.0 mmHg    SHUNTS MV Vmax:       0.75 m/s    Systemic VTI:  0.21 m MV Vmean:      45.9 cm/s   Systemic Diam: 2.20 cm MV Decel Time: 250 msec MV E velocity: 52.30 cm/s MV A velocity: 73.30 cm/s MV E/A ratio:  0.71 Dorris Carnes MD Electronically signed by Dorris Carnes MD Signature Date/Time: 03/25/2022/12:11:06 PM    Final    VAS [place] LOWER EXTREMITY VENOUS (DVT) (ONLY [facility] & [facility])  Result Date: 03/25/2022  Lower Venous DVT Study Patient Name:  Austin Oliver  Date of Exam:   03/25/2022 Medical Rec #: [id number]       Accession #:    [id number] Date of Birth: 1956-05-09       Patient Gender: M Patient Age:   56 years Exam Location:  [facility] Procedure:      VAS [place] LOWER EXTREMITY VENOUS (DVT) Referring Phys: DAVID YAO --------------------------------------------------------------------------------  Indications: Right leg weakness and pain.  Comparison Study: 11-21-2021 Prior bilateral lower extremity venous was negative                   for DVT. Performing Technologist: Darlin Coco RDMS, RVT  Examination Guidelines: A complete evaluation includes B-mode imaging, spectral Doppler, color Doppler, and power Doppler as needed of all accessible portions of each vessel. Bilateral testing is considered an integral part of a complete examination. Limited examinations for reoccurring indications may be performed as noted. The reflux portion of the exam is performed with the patient in reverse Trendelenburg.  +---------+---------------+---------+-----------+----------+--------------+ RIGHT    CompressibilityPhasicitySpontaneityPropertiesThrombus Aging +---------+---------------+---------+-----------+----------+--------------+ CFV      Full           Yes      Yes                                  +---------+---------------+---------+-----------+----------+--------------+ SFJ      Full                                                        +---------+---------------+---------+-----------+----------+--------------+  FV Prox  Full                                                        +---------+---------------+---------+-----------+----------+--------------+ FV Mid   Full                                                        +---------+---------------+---------+-----------+----------+--------------+ FV DistalFull                                                        +---------+---------------+---------+-----------+----------+--------------+ PFV      Full                                                        +---------+---------------+---------+-----------+----------+--------------+ POP      Full           Yes      Yes                                 +---------+---------------+---------+-----------+----------+--------------+ PTV      Full                                                        +---------+---------------+---------+-----------+----------+--------------+ PERO     Full                                                        +---------+---------------+---------+-----------+----------+--------------+ Gastroc  Full                                                        +---------+---------------+---------+-----------+----------+--------------+   +----+---------------+---------+-----------+----------+--------------+ LEFTCompressibilityPhasicitySpontaneityPropertiesThrombus Aging +----+---------------+---------+-----------+----------+--------------+ CFV Full           Yes      Yes                                 +----+---------------+---------+-----------+----------+--------------+     Summary: RIGHT: - There is no evidence of deep vein thrombosis in the lower extremity.  - Appearance of mild effusion of right knee, also  visualized on x-ray.  LEFT: - No evidence of common femoral vein obstruction.  *See table(s) above for measurements and observations. Electronically signed by Deitra Mayo MD on 03/25/2022  at 6:54:00 PM.    Final     Labs:  CBC: Recent Labs    04/05/22 0537 04/07/22 0524 04/10/22 0618 04/14/22 0609  WBC 3.4* 3.5* 3.3* 4.2  HGB 10.2* 9.6* 9.2* 9.7*  HCT 31.3* 28.5* 28.1* 30.2*  PLT 127* 126* 144* 156    COAGS: Recent Labs    10/22/21 1353 03/16/22 1137 03/25/22 1526 03/31/22 0228  INR 1.1 1.2 1.1 1.2  APTT  --  33 28  --     BMP: Recent Labs    04/10/22 0618 04/11/22 0536 04/12/22 0539 04/14/22 0609  NA 130* 129* 132* 132*  K 4.2 4.1 4.6 4.4  CL 91* 92* 95* 97*  CO2 _0 GLUCOSE 105* 109* 164* 190*  BUN 34* 32* 30* 30*  CALCIUM 8.7* 8.2* 8.6* 9.2  CREATININE 1.24 1.32* 1.05 1.15  GFRNONAA >60 59* >60 >60    LIVER FUNCTION TESTS: Recent Labs    03/16/22 1137 03/25/22 1526 04/05/22 0537 04/14/22 0609  BILITOT 1.5* 1.6* 2.4* 2.0*  AST 20 23 38 31  ALT _1 ALKPHOS 52 71 133* 113  PROT 6.3* 7.1 6.0* 5.4*  ALBUMIN 3.3* 3.5 2.4* 1.9*    TUMOR MARKERS: No results for input(s): AFPTM, CEA, CA199, CHROMGRNA in the last 8760 hours.  Assessment and Plan: History of cirrhosis of liver, COPD, CAD, HTN, GERD, nephrolithiasis, HLD, OSA on CPAP, pancytopenia and SSS s/p pacemaker placement. Pt presented to ED c/o weakness and paraesthesias found to have spinal infarct. Pt had liver lesion biopsy 04/07/22 that was positive for high grade B-cell lymphoma. [name], NP, has referred pt to IR for tunneled catheter with port placement with plan to transport to [facility] for urgent chemotherapy and then back to CIR.   Pt resting in bed with wife at bedside.  He is A&O, calm and pleasant.  He is in no distress.  Last Plavix and 81 mg was given 04/14/22 @ 0834. Pt last ate at 0700 this morning-has been NPO since.   Risks and benefits of image guided  tunneled catheter with     port placement with moderate sedation was discussed with the patient including, but not limited to bleeding, infection, pneumothorax, or fibrin sheath development and need for additional procedures.  All of the patient's questions were answered, patient is agreeable to proceed. Consent signed and in chart.   Thank you for this interesting consult.  I greatly enjoyed meeting Austin Oliver and look forward to participating in their care.  A copy of this report was sent to the requesting provider on this date.  Electronically Signed: Tyson Alias, NP 04/09/2022, 12:35 PM   I spent a total of 20 minutes in face to face in clinical consultation, greater than 50% of which was counseling/coordinating care for tunneled catheter with port placement.

## 2022-04-15 NOTE — Progress Notes (Signed)
PROGRESS NOTE   Subjective/Complaints:  Pt reports pain doing much better with tramadol scheduled.  Still having some incontinence of bowel- but having good Bms with bowel program in evening- helping keep him cleaned out.  Wife said not eating much, but pt says has a slightly bigger appetite.  Can now tell he's having a BM- which is new.   New RUE tremor- wife is pt are concerned about.   ROS:    Pt denies SOB, abd pain, CP, N/V/C/D, and vision changes As above otherwise  Objective:   No results found.  Recent Labs    04/14/22 0609  WBC 4.2  HGB 9.7*  HCT 30.2*  PLT 156    Recent Labs    04/14/22 0609  NA 132*  K 4.4  CL 97*  CO2 23  GLUCOSE 190*  BUN 30*  CREATININE 1.15  CALCIUM 9.2    Intake/Output Summary (Last 24 hours) at 04/14/2022 0913 Last data filed at 03/26/2022 0900 Gross per 24 hour  Intake 660 ml  Output 1200 ml  Net -540 ml        Physical Exam: Vital Signs Blood pressure 112/70, pulse 60, temperature 97.8 F (36.6 C), temperature source Oral, resp. rate 18, height '5\' 9"'$  (1.753 m), weight 92.1 kg, SpO2 96 %.        General: awake, alert, appropriate, sitting at sink doing grooming; wife at bedside; OTA in room; NAD HENT: conjugate gaze; oropharynx moist CV: regular rate; no JVD Pulmonary: CTA B/L; no W/R/R- good air movement GI: soft, NT, ND, (+)BS Psychiatric: appropriate- joking some more than normal Neurological: Ox3-new RUE tremor at rest-   strength is basically same as yesterday still as well as sensation Significantly decreased sensation from L1- S2 B/L- and also pinprick decreased L DF 3-/5 and PF 3-/5; HF/KE 1/5- but having pain Skin- warm and dry, slight red patch below R nipple- no dressing from liver biopsy spot Neurological:     Mental Status: He is alert and oriented to person, place, and time.     Comments: Speech clear. Able to answer orientation  questions and follow simple commands without difficulty. Unable to lift right lower extremity, LLE 4/5, b/l UE 4/5 with the exception of EE 3/5  LLE- HF and KE 1/5; DF/PF 4/5-  5/25- RLE 1/5 Extremities: trace pedal edema present bilaterally.  GU: moderate scrotal edema and mild to moderate penile edema   Assessment/Plan: 1. Functional deficits which require 3+ hours per day of interdisciplinary therapy in a comprehensive inpatient rehab setting. Physiatrist is providing close team supervision and 24 hour management of active medical problems listed below. Physiatrist and rehab team continue to assess barriers to discharge/monitor patient progress toward functional and medical goals  Care Tool:  Bathing    Body parts bathed by patient: Right arm, Left arm, Chest, Abdomen, Front perineal area, Right upper leg, Left upper leg, Face   Body parts bathed by helper: Buttocks, Right lower leg, Left lower leg     Bathing assist Assist Level: Moderate Assistance - Patient 50 - 74%     Upper Body Dressing/Undressing Upper body dressing   What is the patient wearing?: Pull  over shirt    Upper body assist Assist Level: Supervision/Verbal cueing    Lower Body Dressing/Undressing Lower body dressing      What is the patient wearing?: Pants, Incontinence brief     Lower body assist Assist for lower body dressing: Total Assistance - Patient < 25%     Toileting Toileting    Toileting assist Assist for toileting: Total Assistance - Patient < 25%     Transfers Chair/bed transfer  Transfers assist  Chair/bed transfer activity did not occur: Safety/medical concerns  Chair/bed transfer assist level: Moderate Assistance - Patient 50 - 74% (SB)     Locomotion Ambulation   Ambulation assist   Ambulation activity did not occur: Safety/medical concerns          Walk 10 feet activity   Assist  Walk 10 feet activity did not occur: Safety/medical concerns        Walk 50  feet activity   Assist Walk 50 feet with 2 turns activity did not occur: Safety/medical concerns         Walk 150 feet activity   Assist Walk 150 feet activity did not occur: Safety/medical concerns         Walk 10 feet on uneven surface  activity   Assist Walk 10 feet on uneven surfaces activity did not occur: Safety/medical concerns         Wheelchair     Assist Is the patient using a wheelchair?: Yes Type of Wheelchair: Manual Wheelchair activity did not occur: Safety/medical concerns  Wheelchair assist level: Supervision/Verbal cueing Max wheelchair distance: 100 (Per staff report)    Wheelchair 50 feet with 2 turns activity    Assist    Wheelchair 50 feet with 2 turns activity did not occur: Safety/medical concerns   Assist Level: Supervision/Verbal cueing   Wheelchair 150 feet activity     Assist  Wheelchair 150 feet activity did not occur: Safety/medical concerns   Assist Level: Supervision/Verbal cueing   Blood pressure 112/70, pulse 60, temperature 97.8 F (36.6 C), temperature source Oral, resp. rate 18, height '5\' 9"'$  (1.753 m), weight 92.1 kg, SpO2 96 %.  Medical Problem List and Plan: 1. 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              -patient may shower             -ELOS/Goals: S/Min A 20-22 days  D/c 6/17  Spoke with Oncology NP- working on plan  Con't CIR_ PT, OT- pt to participate as tolerated- going to arrange to send to [facility] on Tuesday.   Con't CIR PT and OT- d/w to [facility] Either Monday or Tuesday and will get back here afterwards.   -He was working on sitting balance today 5/30- will transfer to [facility] for chemo - planning R-CHOP- labs already ordered- let SW know- and PA ot help facilitate this.  2. Impaired mobility: continue Lovenox             -antiplatelet therapy: DAPT on hold for procedure  5/24-  Plavix and ASA restarted today 3. Pain Management:  Tylenol prn.   5/26- on Oxycodone 5-10 mg q4 hours- will add tramadol 100 mg q8 hours for pain control and to try and reduce confusion.   5/27- pain doing better- con't regimen  5/28- pain much better with scheduled tramadol 4. Mood: LCSW to follow for evaluation and support.              -  antipsychotic agents: N/A 5. Neuropsych: This patient is capable of making decisions on her own behalf. Very HOH and not quitehimself cognitively.  6. Skin/Wound Care: Routine pressure relief measures.  7. Fluids/Electrolytes/Nutrition: Encourage fluid intake. Recheck CMET in am.  8. CAD/SSS s/p ICD/PPM: On Zetia and Ranexa--ASA/Plavix on hold for procedure 9. T2DM: Hgb A1c has dropped from 6.8-->5.4 since 11/19/21.             --has been off metformin since last admission.  --continue to  monitor BS ac/hs and [place] SSI for elevated BS.   5/26- CBG's controlled- con't regimen  5/28- CBGs rising due to decadron- 130s-low 200s- will con't SSI and wait on DM meds for now 10: Anemia of chronic disease:    -5/19 Hb stable at 9.7 11. Thrombocytopenia: Has been 121 since 04/30-->115. --off ASA/Plavix --monitor for signs of bleeding.  5/24- will recheck labs in AM  5/25- platelets up to 144k- doing better 5/30- plts up to 156k 12. Pre-renal azotemia: Encourage fluid intake.   5/23- Cr up slightly to 1.31- from average of 1.2- will push fluids-   5/24- will recheck BMP tomorrow AM  5/25- Cr 1.24 and BUN 34- is dry- will need IVFs- but will start after therapy- 75 cc/hour x 24 hours  5/26- will con't IVFs 100cc hour another 24 hours since Cr up to 1.32 and BUN 32  5/27- Cr down to 1.05 and BUN 30- still a little dry, but will recheck in Am and restart IVFs if needed  5/29 Cr around his baseline at 1.15, follow 13. Concerns of metastatic disease: Liver biopsy 04/07/22 for work up  5/23- liver biopsy results pending  5/26- has high grade B Cell lymphoma-  Oncology consulted-  14. Urinary retention: Foley d/ced. Continue Flomax/Proscar. Increase Flomax to 0.'8mg'$ . Add bethenacol '5mg'$  TID  5/25- still requiring caths- q6 hours- will con't to push fluids- no changes  5/28- no changes- con't in/out caths- stopped Bethanacol- pt thinks it's causing nausea? 15. COPD: Respiratory status stable. Will change Yupelri to MDI per patient request.  16. Right knee buckling: discussed importance of quadricpes strengthening 17. Numbness and tingling of lower extremities: discussed Qutenza as an option outpatient.  18. Post TKA right knee pain: discussed that Qutenza can be tried on knee as well outpatient.  19. Hyperkalemia: given kayexalate today  5/28- K+ 4.6 20. Constipation in setting of neurogenic bowel: give kayexalate today  5/23- has been cleaned out- on bowel program nightly  5/29 he had BM today, improved, continue to follow 21. Abdominal pain: still constipation on KUB today despite BM today- will give milk of mag to help clear out.   5/25- resolved for now 22. LLE weakness  5/25- will have PT check strength to compare from admission- if still weaker after pain meds (since pain is affecting HF/KE), then will get thoracic/lumbar MRI- will need contrast with and without based on last MRI- will give IV pain meds for MRI if need be.   5/26- will get thoracic and lumbar MRIs- still pending- giving Ativan and Morphine IV for MRI's.   5/27- wasn't done yesterday- reordered meds for MRI- to be done at 3pm today- has pacemaker and hearing aids. Nursing have been able to arrange- GREAT WORK!  5/28- MRI shows no new infarct, however has signs of mets to lumbar spine- which could explain his Sx's?Went over with pt. And wife 61. Hyponatremia  5/26- will recheck in Am after IVFs- encouraged pt to drink some more, but  not overdrink due to low Na.   5/27- Na up to 132- will try off IVFs and recheck   5/29 Na stable at 132. Continue to follow 24. N/V/burping  5/27-  will change protonix to 40 mg IV and might need BID  5/28- change to BID and add tums scheduled with meals.  25. Poor appetite/poor nutrition  5/27- will add Megace '800mg'$  daily.  5/29 ate 75% breakfast-this is improved,  albumin 1.9, continue to follow 26. Penile/scrotal edema 5/27- per wife, better than yesterday- will elevate with towel- demonstrated to nurse/pt/wife- and monitor  27. Severe malnutrition base don Albumin  5/30- Albumin 1.9- might need additional nutrition- since not hungry/poor appetite.  28. NEW RUE tremor- will ask if [facility] can get pt to see Neuro when over at [facility]- Not sure why, however likely related to Cancer dx.    I spent a total of  34  minutes on total care today- >50% coordination of care- due to d/w Oncology as well as prolonged d/w SW and therapy about his d/c date/time.     LOS: 11 days A FACE TO FACE EVALUATION WAS PERFORMED  Tanza Pellot 03/17/2022, 9:13 AM

## 2022-04-15 NOTE — Plan of Care (Signed)
Discharging to [facility] for Chemotherapy and will not return to CIR.

## 2022-04-15 NOTE — Progress Notes (Signed)

## 2022-04-16 ENCOUNTER — Inpatient Hospital Stay (HOSPITAL_COMMUNITY): Payer: Medicare Other

## 2022-04-16 DIAGNOSIS — C8333 Diffuse large B-cell lymphoma, intra-abdominal lymph nodes: Secondary | ICD-10-CM

## 2022-04-16 DIAGNOSIS — E119 Type 2 diabetes mellitus without complications: Secondary | ICD-10-CM

## 2022-04-16 DIAGNOSIS — R339 Retention of urine, unspecified: Secondary | ICD-10-CM

## 2022-04-16 DIAGNOSIS — I10 Essential (primary) hypertension: Secondary | ICD-10-CM

## 2022-04-16 HISTORY — PX: IR IMAGING GUIDED PORT INSERTION: IMG5740

## 2022-04-16 LAB — CBC
HCT: 30.4 % — ABNORMAL LOW (ref 39.0–52.0)
Hemoglobin: 10 g/dL — ABNORMAL LOW (ref 13.0–17.0)
MCH: 29.4 pg (ref 26.0–34.0)
MCHC: 32.9 g/dL (ref 30.0–36.0)
MCV: 89.4 fL (ref 80.0–100.0)
Platelets: 134 10*3/uL — ABNORMAL LOW (ref 150–400)
RBC: 3.4 MIL/uL — ABNORMAL LOW (ref 4.22–5.81)
RDW: 19 % — ABNORMAL HIGH (ref 11.5–15.5)
WBC: 4.2 10*3/uL (ref 4.0–10.5)
nRBC: 0.7 % — ABNORMAL HIGH (ref 0.0–0.2)

## 2022-04-16 LAB — GLUCOSE, CAPILLARY
Glucose-Capillary: 160 mg/dL — ABNORMAL HIGH (ref 70–99)
Glucose-Capillary: 180 mg/dL — ABNORMAL HIGH (ref 70–99)
Glucose-Capillary: 182 mg/dL — ABNORMAL HIGH (ref 70–99)
Glucose-Capillary: 224 mg/dL — ABNORMAL HIGH (ref 70–99)

## 2022-04-16 LAB — COMPREHENSIVE METABOLIC PANEL
ALT: 27 U/L (ref 0–44)
AST: 34 U/L (ref 15–41)
Albumin: 2.2 g/dL — ABNORMAL LOW (ref 3.5–5.0)
Alkaline Phosphatase: 102 U/L (ref 38–126)
Anion gap: 11 (ref 5–15)
BUN: 44 mg/dL — ABNORMAL HIGH (ref 8–23)
CO2: 24 mmol/L (ref 22–32)
Calcium: 9.5 mg/dL (ref 8.9–10.3)
Chloride: 101 mmol/L (ref 98–111)
Creatinine, Ser: 0.99 mg/dL (ref 0.61–1.24)
GFR, Estimated: >60 mL/min (ref >60)
Glucose, Bld: 166 mg/dL — ABNORMAL HIGH (ref 70–99)
Potassium: 5.3 mmol/L — ABNORMAL HIGH (ref 3.5–5.1)
Sodium: 136 mmol/L (ref 135–145)
Total Bilirubin: 2.8 mg/dL — ABNORMAL HIGH (ref 0.3–1.2)
Total Protein: 5.5 g/dL — ABNORMAL LOW (ref 6.5–8.1)

## 2022-04-16 MED ORDER — RANOLAZINE ER 500 MG PO TB12: 1000.0000 mg | ORAL_TABLET | Freq: Two times a day (BID) | ORAL | Status: DC

## 2022-04-16 MED ORDER — MIDAZOLAM HCL 2 MG/2ML IJ SOLN
INTRAMUSCULAR | Status: AC | PRN
  Administered 2022-04-16: 1 mg via INTRAVENOUS

## 2022-04-16 MED ORDER — ROSUVASTATIN CALCIUM 5 MG PO TABS
5.0000 mg | ORAL_TABLET | Freq: Every day | ORAL | Status: DC
  Administered 2022-04-17 – 2022-04-24 (×8): 5 mg via ORAL
  Filled 2022-04-16 (×8): qty 1

## 2022-04-16 MED ORDER — ENOXAPARIN SODIUM 40 MG/0.4ML IJ SOSY
40.0000 mg | PREFILLED_SYRINGE | INTRAMUSCULAR | Status: DC
  Administered 2022-04-17 – 2022-04-23 (×7): 40 mg via SUBCUTANEOUS
  Filled 2022-04-16 (×7): qty 0.4

## 2022-04-16 MED ORDER — LIDOCAINE-EPINEPHRINE 1 %-1:100000 IJ SOLN
INTRAMUSCULAR | Status: AC | PRN
  Administered 2022-04-16: 10 mL via INTRADERMAL

## 2022-04-16 MED ORDER — FENTANYL CITRATE (PF) 100 MCG/2ML IJ SOLN
INTRAMUSCULAR | Status: AC | PRN
  Administered 2022-04-16: 50 ug via INTRAVENOUS

## 2022-04-16 MED ORDER — FENTANYL CITRATE (PF) 100 MCG/2ML IJ SOLN
INTRAMUSCULAR | Status: AC
  Filled 2022-04-16: qty 2

## 2022-04-16 MED ORDER — HEPARIN SOD (PORK) LOCK FLUSH 100 UNIT/ML IV SOLN
INTRAVENOUS | Status: AC
  Filled 2022-04-16: qty 5

## 2022-04-16 MED ORDER — CLOPIDOGREL BISULFATE 75 MG PO TABS
75.0000 mg | ORAL_TABLET | Freq: Every day | ORAL | Status: DC
  Administered 2022-04-16 – 2022-04-24 (×9): 75 mg via ORAL
  Filled 2022-04-16 (×9): qty 1

## 2022-04-16 MED ORDER — TRAMADOL HCL 50 MG PO TABS
25.0000 mg | ORAL_TABLET | Freq: Once | ORAL | Status: AC
  Administered 2022-04-16: 25 mg via ORAL
  Filled 2022-04-16: qty 1

## 2022-04-16 MED ORDER — MIDAZOLAM HCL 2 MG/2ML IJ SOLN
INTRAMUSCULAR | Status: AC
  Filled 2022-04-16: qty 2

## 2022-04-16 MED ORDER — LIDOCAINE-EPINEPHRINE 1 %-1:100000 IJ SOLN
INTRAMUSCULAR | Status: AC
  Filled 2022-04-16: qty 1

## 2022-04-16 NOTE — Progress Notes (Signed)
Initial Nutrition Assessment  INTERVENTION:   Once diet advanced: -Boost Breeze po TID, each supplement provides 250 kcal and 9 grams of protein -Multivitamin with minerals daily  NUTRITION DIAGNOSIS:   Increased nutrient needs related to chronic illness, cancer and cancer related treatments as evidenced by estimated needs.  GOAL:   Patient will meet greater than or equal to 90% of their needs  MONITOR:   Diet advancement, Labs, Weight trends, I & O's  REASON FOR ASSESSMENT:   Malnutrition Screening Tool    ASSESSMENT:   66 y.o. 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  Patient currently NPO. Plan is for port-a-cath placement. Pt most recently discharged to CIR on 5/19. Transferred to [place] to begin treatment for B cell lymphoma (chemo, radiation).  Pt was consuming 50-75% of meals yesterday when on diet.  Will resume Boost Breeze and MVI once diet  is advanced.  Per weight records, pt has lost 15 lbs since 1/19 (6% wt loss x 4.5 months, insignificant for time frame).  Medications: Colace, Vitamin B-12  Labs reviewed:  CBGs: 151-219 Elevated K (5.3)  NUTRITION - FOCUSED PHYSICAL EXAM:  Flowsheet Row Most Recent Value  Orbital Region No depletion  Upper Arm Region No depletion  Thoracic and Lumbar Region No depletion  Buccal Region No depletion  Clavicle Bone Region Mild depletion  Clavicle and Acromion Bone Region No depletion  Scapular Bone Region No depletion  Dorsal Hand Mild depletion  Patellar Region No  depletion  Anterior Thigh Region No depletion  Posterior Calf Region No depletion  Edema (RD Assessment) Moderate  [BLE edema]  Eyes Reviewed  Mouth Reviewed  Skin Reviewed  Nails Reviewed       Diet Order:   Diet Order             Diet NPO time specified  Diet effective now                   EDUCATION NEEDS:   No education needs have been identified at this time  Skin:  Other: MASD -sacrum  Last BM:  5/30 -type 6  Height:   Ht Readings from Last 1 Encounters:  03/31/2022 '5\' 9"'$  (1.753 m)    Weight:   Wt Readings from Last 1 Encounters:  04/06/2022 93 kg    BMI:  Body mass index is 30.28 kg/m.  Estimated Nutritional Needs:   Kcal:  2000-2200  Protein:  100-120g  Fluid:  2L/day   Clayton Bibles, MS, RD, LDN Inpatient Clinical Dietitian Contact information available via Amion

## 2022-04-16 NOTE — Progress Notes (Signed)
OK to reduce Doxorubicin & Vincristine doses by 50% per Dr Julien Nordmann (Tbili elevated).  Kennith Center, Pharm.D., CPP 04/16/2022'@9'$ :13 AM

## 2022-04-16 NOTE — Progress Notes (Signed)
MEDICATION-RELATED CONSULT NOTE   IR Procedure Consult - Anticoagulant/Antiplatelet PTA/Inpatient Med List Review by Pharmacist    Procedure: R IJ Port catheter placement      Completed: 04/16/2022 ~ 1550  Post-Procedural bleeding risk per IR MD assessment: low  Antithrombotic medications on inpatient or PTA profile prior to procedure:    EC Aspirin '81mg'$  PO every evening Clopidogrel '75mg'$  PO daily (qAM) -also on Enoxaparin '40mg'$  SQ q24h prior to transfer from CIR (last dose 04/14/2022 at 0833) > SCDs ordered upon transfer to [facility]  Recommended restart time per IR Post-Procedure Guidelines:   Aspirin '81mg'$ : unlikely that it was stopped Clopidogrel '75mg'$ : next AM Prophylactic LMWH: at least 4 hours after procedure or at next standard dose interval    Plan:     Continue EC Aspirin '81mg'$  PO every evening as ordered (next dose will be this evening at 1800) Continue Clopidogrel '75mg'$  PO daily (next dose will be 04/17/2022 at 1000) Per Dr. Aileen Fass, resume Enoxaparin '40mg'$  SQ q24h on 04/17/2022 at 1000.  Pharmacy signing off.    Lindell Spar, PharmD, BCPS Clinical Pharmacist  04/16/2022 5:47 PM

## 2022-04-16 NOTE — Progress Notes (Signed)
[facility] PROGRESS NOTE    Progress Note  Austin Oliver  MVE:[id number] DOB: [birth year] DOA: 04/06/2022 PCP: [name], MD     Brief Narrative:   Austin Oliver is an 66 y.o. 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  Oncology was consulted and recommended moved to [facility] for urgent chemotherapy    Assessment/Plan:   High-grade B-cell lymphoma: Admitted to inpatient. Oncology has been consulted. IR has been consulted for port placement Oncology recommended to start her cycle every 21 days of prednisone rituximab cyclophosphamide, doxorubicin and vincristine  History of recurrent CVA and history of spinal cord infarction [facility]): In by neurologist on last admission concerned about hypercoagulable state due to his spinal cord infarct on the previous admission. Continue aspirin and Plavix. These have been held as he is going for Port-A-Cath placement. Continue Crestor and Zetia.  Essential hypertension: Blood pressure soft continue to hold home regimen.  COPD: Resume home regimen.  Sick sinus syndrome: Continue current home regimen.    CAD/hyperlipidemia: Continue Crestor and statins.  Pituitary microadenoma: Follow-up with neurosurgery as an outpatient.  BPH: Continue Flomax and finasteride continue Foley.  Diabetes mellitus type 2: With a last A1c of 5.4, continue sliding scale insulin patient is currently NPO.    DVT prophylaxis: lovenox Family Communication:none Status is:  Inpatient Remains inpatient appropriate because: Newly diagnosed high-grade lymphoma starting chemotherapy as an inpatient.    Code Status:     Code Status Orders  (From admission, onward)           Start     Ordered   04/04/2022 1701  Full code  Continuous        04/14/2022 1703           Code Status History     Date Active Date Inactive Code Status Order ID Comments User Context   04/04/2022 1429 04/12/2022 1531 Full Code [id number]  Flora Lipps Inpatient   03/25/2022 1919 04/04/2022 1424 Full Code [id number]  Elodia Florence., MD ED   03/16/2022 1557 03/18/2022 2147 Full Code [id number]  Domenic Polite, MD Inpatient   11/20/2021 2246 11/23/2021 2005 Full Code [id number]  Florencia Reasons, MD ED   10/31/2021 1747 11/01/2021 2024 Full Code [id number]  Rod Can, MD Inpatient   04/25/2021 1300 04/25/2021 2113 Full Code [id number]  [name], MD Inpatient   12/04/2020 1205 12/04/2020 2214 Full Code [id number]  [name], MD Inpatient   08/24/2020 1328 08/24/2020 2238 Full Code [id number]  [name], MD Inpatient   03/09/2017 1810 03/11/2017 1707 Full Code [id number]  Rod Can, MD Inpatient         IV Access:   Peripheral IV   Procedures and diagnostic studies:   ECHOCARDIOGRAM COMPLETE  Result Date: 04/04/2022    ECHOCARDIOGRAM REPORT   Patient Name:   Austin Oliver Date of Exam: 03/30/2022 Medical Rec #:  [id number]      Height:       69.0 in Accession #:    [id number]     Weight:  203.0 lb Date of Birth:  1956-09-07      BSA:          2.079 m Patient Age:    46 years       BP:           112/78 mmHg Patient Gender: M              HR:           66 bpm. Exam Location:  Inpatient Procedure: 2D Echo, Cardiac Doppler, Color Doppler and Strain Analysis Indications:    Chemo  History:        Patient has prior history of Echocardiogram examinations, most                 recent 11/20/2021. COPD; Risk Factors:Hypertension and                 Dyslipidemia. 04/25/21  cath                 12/04/20 pacemaker.  Sonographer:    Luisa Hart RDCS Referring Phys: [place]  1. Left ventricular ejection fraction, by estimation, is 55 to 60%. The left ventricle has normal function. The left ventricle has no regional wall motion abnormalities. Left ventricular diastolic parameters were normal.  2. Right ventricular systolic function is normal. The right ventricular size is normal.  3. The mitral valve is normal in structure. Trivial mitral valve regurgitation.  4. The aortic valve is tricuspid. Aortic valve regurgitation is trivial. Aortic valve sclerosis is present, with no evidence of aortic valve stenosis. Comparison(s): The left ventricular function is unchanged. FINDINGS  Left Ventricle: Left ventricular ejection fraction, by estimation, is 55 to 60%. The left ventricle has normal function. The left ventricle has no regional wall motion abnormalities. The left ventricular internal cavity size was normal in size. There is  no left ventricular hypertrophy. Left ventricular diastolic parameters were normal. Right Ventricle: The right ventricular size is normal. Right vetricular wall thickness was not assessed. Right ventricular systolic function is normal. Left Atrium: Left atrial size was normal in size. Right Atrium: Right atrial size was normal in size. Pericardium: There is no evidence of pericardial effusion. Mitral Valve: The mitral valve is normal in structure. Trivial mitral valve regurgitation. MV peak gradient, 2.2 mmHg. The mean mitral valve gradient is 1.0 mmHg. Tricuspid Valve: The tricuspid valve is normal in structure. Tricuspid valve regurgitation is trivial. Aortic Valve: The aortic valve is tricuspid. Aortic valve regurgitation is trivial. Aortic regurgitation PHT measures 636 msec. Aortic valve sclerosis is present, with no evidence of aortic valve stenosis. Aortic valve mean gradient measures 3.3 mmHg. Aortic valve peak gradient measures 6.7  mmHg. Aortic valve area, by VTI measures 3.28 cm. Pulmonic Valve: The pulmonic valve was grossly normal. Pulmonic valve regurgitation is not visualized. Aorta: The aortic root and ascending aorta are structurally normal, with no evidence of dilitation. IAS/Shunts: No atrial level shunt detected by color flow Doppler.  LEFT VENTRICLE PLAX 2D LVIDd:         5.10 cm     Diastology LVIDs:         3.40 cm     LV e' medial:    7.07 cm/s LV PW:         1.00 cm     LV E/e' medial:  7.4 LV IVS:        1.00 cm     LV e' lateral:   14.00 cm/s LVOT  diam:     2.20 cm     LV E/e' lateral: 3.7 LV SV:         79 LV SV Index:   38 LVOT Area:     3.80 cm  LV Volumes (MOD) LV vol d, MOD A2C: 42.3 ml LV vol d, MOD A4C: 99.0 ml LV vol s, MOD A2C: 18.9 ml LV vol s, MOD A4C: 39.8 ml LV SV MOD A2C:     23.4 ml LV SV MOD A4C:     99.0 ml LV SV MOD BP:      36.8 ml RIGHT VENTRICLE RV Basal diam:  3.00 cm RV Mid diam:    3.00 cm RV S prime:     13.60 cm/s TAPSE (M-mode): 1.3 cm LEFT ATRIUM           Index        RIGHT ATRIUM           Index LA diam:      3.40 cm 1.64 cm/m   RA Area:     13.60 cm LA Vol (A4C): 31.1 ml 14.96 ml/m  RA Volume:   33.00 ml  15.87 ml/m  AORTIC VALVE                    PULMONIC VALVE AV Area (Vmax):    3.27 cm     PV Vmax:       1.38 m/s AV Area (Vmean):   3.04 cm     PV Vmean:      87.300 cm/s AV Area (VTI):     3.28 cm     PV VTI:        0.258 m AV Vmax:           129.00 cm/s  PV Peak grad:  7.7 mmHg AV Vmean:          84.400 cm/s  PV Mean grad:  3.5 mmHg AV VTI:            0.241 m AV Peak Grad:      6.7 mmHg AV Mean Grad:      3.3 mmHg LVOT Vmax:         111.00 cm/s LVOT Vmean:        67.450 cm/s LVOT VTI:          0.208 m LVOT/AV VTI ratio: 0.86 AI PHT:            636 msec  AORTA Ao Root diam: 3.50 cm Ao Asc diam:  2.80 cm MITRAL VALVE               TRICUSPID VALVE MV Area (PHT): 3.03 cm    TR Peak grad:   25.0 mmHg MV Area VTI:   3.44 cm    TR Vmax:        250.00 cm/s MV Peak grad:  2.2 mmHg MV Mean  grad:  1.0 mmHg    SHUNTS MV Vmax:       0.75 m/s    Systemic VTI:  0.21 m MV Vmean:      45.9 cm/s   Systemic Diam: 2.20 cm MV Decel Time: 250 msec MV E velocity: 52.30 cm/s MV A velocity: 73.30 cm/s MV E/A ratio:  0.71 Dorris Carnes MD Electronically signed by Dorris Carnes MD Signature Date/Time: 03/31/2022/12:11:06 PM    Final      Medical Consultants:   None.   Subjective:    Austin Oliver eating breakfast this  morning.  No complaints  Objective:    Vitals:   04/13/2022 2321 04/16/22 0323 04/16/22 0638 04/16/22 0759  BP: '99/63 96/63 99/66 '$   Pulse: 71 70 73   Resp: '16 16 16   '$ Temp: 97.7 F (36.5 C) 97.6 F (36.4 C) 98 F (36.7 C)   TempSrc: Oral Oral Oral   SpO2: 96% 96% 98% 99%  Weight:      Height:       SpO2: 99 %   Intake/Output Summary (Last 24 hours) at 04/16/2022 0825 Last data filed at 04/16/2022 0600 Gross per 24 hour  Intake 10 ml  Output 1200 ml  Net -1190 ml   Filed Weights   03/26/2022 1744  Weight: 93 kg    Exam: General exam: In no acute distress. Respiratory system: Good air movement and clear to auscultation. Cardiovascular system: S1 & S2 heard, RRR. No JVD. Gastrointestinal system: Abdomen is nondistended, soft and nontender.  Extremities: No pedal edema. Skin: No rashes, lesions or ulcers Psychiatry: Judgement and insight appear normal. Mood & affect appropriate.    Data Reviewed:    Labs: Basic Metabolic Panel: Recent Labs  Lab 04/11/22 0536 04/12/22 0539 04/14/22 0609 03/23/2022 1814 04/16/22 0600  NA 129* 132* 132* 136 136  K 4.1 4.6 4.4 5.2* 5.3*  CL 92* 95* 97* 99 101  CO2 '23 23 23 26 24  '$ GLUCOSE 109* 164* 190* 160* 166*  BUN 32* 30* 30* 43* 44*  CREATININE 1.32* 1.05 1.15 1.02 0.99  CALCIUM 8.2* 8.6* 9.2 9.6 9.5   GFR Estimated Creatinine Clearance: 81.5 mL/min (by C-G formula based on SCr of 0.99 mg/dL). Liver Function Tests: Recent Labs  Lab 04/14/22 0609 04/05/2022 1814 04/16/22 0600  AST 31 31 34  ALT '29 27 27   '$ ALKPHOS 113 109 102  BILITOT 2.0* 2.7* 2.8*  PROT 5.4* 5.9* 5.5*  ALBUMIN 1.9* 2.2* 2.2*   No results for input(s): LIPASE, AMYLASE in the last 168 hours. No results for input(s): AMMONIA in the last 168 hours. Coagulation profile No results for input(s): INR, PROTIME in the last 168 hours. COVID-19 Labs  No results for input(s): DDIMER, FERRITIN, LDH, CRP in the last 72 hours.  Lab Results  Component Value Date   SARSCOV2NAA NEGATIVE 03/25/2022   SARSCOV2NAA NEGATIVE 03/16/2022   SARSCOV2NAA POSITIVE (A) 11/20/2021   SARSCOV2NAA RESULT: NEGATIVE 10/29/2021    CBC: Recent Labs  Lab 04/10/22 0618 04/14/22 0609 04/03/2022 1814 04/16/22 0600  WBC 3.3* 4.2 3.8* 4.2  NEUTROABS 1.7  --  3.0  --   HGB 9.2* 9.7* 10.2* 10.0*  HCT 28.1* 30.2* 31.5* 30.4*  MCV 86.5 88.8 89.2 89.4  PLT 144* 156 157 134*   Cardiac Enzymes: No results for input(s): CKTOTAL, CKMB, CKMBINDEX, TROPONINI in the last 168 hours. BNP (last 3 results) No results for input(s): PROBNP in the last 8760 hours. CBG: Recent Labs  Lab 04/13/2022 0603 04/05/2022 1157 04/16/2022 1718 04/14/2022 2207 04/16/22 0716  GLUCAP 157* 160* 151* 219* 160*   D-Dimer: No results for input(s): DDIMER in the last 72 hours. Hgb A1c: No results for input(s): HGBA1C in the last 72 hours. Lipid Profile: No results for input(s): CHOL, HDL, LDLCALC, TRIG, CHOLHDL, LDLDIRECT in the last 72 hours. Thyroid function studies: No results for input(s): TSH, T4TOTAL, T3FREE, THYROIDAB in the last 72 hours.  Invalid input(s): FREET3 Anemia work up: No results for input(s): VITAMINB12, FOLATE, FERRITIN, TIBC, IRON, RETICCTPCT in the last 72 hours. Sepsis Labs: Recent Labs  Lab 04/10/22 0618 04/14/22 0609 03/22/2022 1814 04/16/22 0600  WBC 3.3* 4.2 3.8* 4.2   Microbiology Recent Results (from the past 240 hour(s))  Urine Culture     Status: Abnormal   Collection Time: 04/09/22 11:55 AM   Specimen: Urine, Catheterized  Result  Value Ref Range Status   Specimen Description URINE, CATHETERIZED  Final   Special Requests   Final    NONE Performed at [facility], [place] [address], [place], [place] [address]    Culture 40,000 COLONIES/mL STAPHYLOCOCCUS EPIDERMIDIS (A)  Final   Report Status 04/11/2022 FINAL  Final   Organism ID, Bacteria STAPHYLOCOCCUS EPIDERMIDIS (A)  Final      Susceptibility   Staphylococcus epidermidis - MIC*    CIPROFLOXACIN <=0.5 SENSITIVE Sensitive     GENTAMICIN <=0.5 SENSITIVE Sensitive     NITROFURANTOIN <=16 SENSITIVE Sensitive     OXACILLIN >=4 RESISTANT Resistant     TETRACYCLINE 2 SENSITIVE Sensitive     VANCOMYCIN 2 SENSITIVE Sensitive     TRIMETH/SULFA 20 SENSITIVE Sensitive     CLINDAMYCIN 4 RESISTANT Resistant     RIFAMPIN <=0.5 SENSITIVE Sensitive     Inducible Clindamycin NEGATIVE Sensitive     * 40,000 COLONIES/mL STAPHYLOCOCCUS EPIDERMIDIS     Medications:    aspirin EC  81 mg Oral QPM   clopidogrel  75 mg Oral Daily   docusate sodium  100 mg Oral BID   ezetimibe  10 mg Oral q AM   finasteride  5 mg Oral QHS   gabapentin  200 mg Oral QHS   insulin aspart  0-6 Units Subcutaneous TID WC   levothyroxine  112 mcg Oral QAC breakfast   pantoprazole  40 mg Oral QPC supper   tamsulosin  0.8 mg Oral Q supper   umeclidinium bromide  1 puff Inhalation Daily   vitamin B-12  1,000 mcg Oral q AM   Continuous Infusions:    LOS: 1 day   Charlynne Cousins  [facility]  04/16/2022, 8:25 AM

## 2022-04-16 NOTE — Progress Notes (Signed)
Inpatient Rehabilitation Admissions Coordinator   Patient admitted to [facility] 04/04/22 with Dr Dagoberto Ligas as attending. Te readmit to acute hospital at [facility] for chemotherapy and possible radiation per Dr Julien Nordmann consult 5/26. I await further clarification between Oncology and Dr Dagoberto Ligas concerning eventual disposition needs so that I can assist with planning readmit to [facility] when appropriate and [facility] medicare approval vs other rehab venue dispositions. Recommend continue PT and OT at [facility] as he undergoes medical treatment. Please call me with any questions as needed.  Danne Baxter, RN, MSN Rehab Admissions Coordinator [phone] 04/16/2022 10:48 AM

## 2022-04-16 NOTE — Procedures (Signed)
  Procedure:  R IJ Port catheter placement   Preprocedure diagnosis: lymphoma Postprocedure diagnosis: same EBL:    minimal Complications:   none immediate  See full dictation in [facility].  Dillard Cannon MD Main # [phone] Pager  [phone] Mobile [phone]

## 2022-04-16 NOTE — Progress Notes (Signed)
Physical Therapy Discharge Summary  Patient Details  Name: Austin Oliver MRN: [id number] Date of Birth: 15-Jan-1956   Patient has met 0 of 11 long term goals due to discharge to [facility] following new dx of lymphoma with mets to spine. Pt currently requires modA for bed mobility, modA for slide board transfers to/from bed, and requires use of Clarise Cruz to attempt standing. Pt's wife has been present during therapy sessions. Patient to discharge at overall Total Assist level.     Reasons goals not met: Discharge to [facility] for continued treatment of lymphoma with mets to spine; rehab program not completed  Recommendation:  Patient will benefit from ongoing skilled PT services in  acute care setting  to continue to advance safe functional mobility, address ongoing impairments in endurance, strength, safety, independence with functional mobility, and minimize fall risk.  Equipment: No equipment provided  Reasons for discharge: discharge from hospital and discharge to [facility] for treatment of lymphoma and mets to spine  Patient/family agrees with progress made and goals achieved: Yes  PT Discharge Precautions/Restrictions Precautions Precautions: Fall Restrictions Weight Bearing Restrictions: No Pain Interference Pain Interference Pain Effect on Sleep: 2. Occasionally Pain Interference with Therapy Activities: 2. Occasionally Pain Interference with Day-to-Day Activities: 2. Occasionally Vision/Perception  Perception Perception: Within Functional Limits Praxis Praxis: Intact  Cognition Overall Cognitive Status: Within Functional Limits for tasks assessed Arousal/Alertness: Awake/alert Orientation Level: Oriented X4 Year: 2023 Attention: Sustained Focused Attention: Appears intact Sustained Attention: Appears intact Memory: Impaired Memory Impairment: Retrieval deficit;Decreased short term memory Awareness: Appears intact Problem Solving: Impaired Safety/Judgment:  Appears intact Sensation Sensation Light Touch: Impaired by gross assessment Light Touch Impaired Details: Impaired RLE;Impaired LLE Proprioception: Impaired Detail Proprioception Impaired Details: Impaired RLE;Impaired LLE Additional Comments: impaired RLE>LLE Coordination Gross Motor Movements are Fluid and Coordinated: No Fine Motor Movements are Fluid and Coordinated: No Motor  Motor Motor: Paraplegia Motor - Skilled Clinical Observations: RLE hemiplegia, LLE hemipareisis Motor - Discharge Observations: new mets to spine, BLE weakness  Mobility Bed Mobility Bed Mobility: Rolling Right;Rolling Left;Supine to Sit;Sit to Supine Rolling Right: Moderate Assistance - Patient 50-74% Rolling Left: Moderate Assistance - Patient 50-74% Supine to Sit: Moderate Assistance - Patient 50-74% Sit to Supine: Moderate Assistance - Patient 50-74% Transfers Transfers: Sit to Stand;Lateral/Scoot Transfers Sit to Stand: Dependent - mechanical lift Lateral/Scoot Transfers: Moderate Assistance - Patient 50-74% Transfer (Assistive device): Other (Comment) (slide board) Transfer via Lift Equipment: Animal nutritionist: No Gait Gait: No Stairs / Additional Locomotion Stairs: No Pick up small object from the floor (from standing position) activity did not occur: Safety/medical concerns Architect: Yes Wheelchair Assistance: Chartered loss adjuster: Both upper extremities Wheelchair Parts Management: Needs assistance Distance: 100  Trunk/Postural Assessment  Cervical Assessment Cervical Assessment: Within Functional Limits Thoracic Assessment Thoracic Assessment: Exceptions to [facility] (rounded shoulders) Lumbar Assessment Lumbar Assessment: Exceptions to [facility] (posterior pelvic tilt) Postural Control Postural Control: Deficits on evaluation Trunk Control: impaired dynamically Righting Reactions: impaired and inadequate   Balance Balance Balance Assessed: Yes Static Sitting Balance Static Sitting - Balance Support: No upper extremity supported;Feet supported Static Sitting - Level of Assistance: 5: Stand by assistance Dynamic Sitting Balance Dynamic Sitting - Balance Support: No upper extremity supported;Feet supported;During functional activity Dynamic Sitting - Level of Assistance: 4: Min Insurance risk surveyor Standing - Balance Support: Bilateral upper extremity supported;During functional activity Static Standing - Level of Assistance: 1: +2 Total assist Extremity Assessment  RLE Assessment RLE Assessment: Exceptions to [facility] General Strength Comments: 0/5 for MMT; functionally demonstrated grossly 3- to 3/5 during modified stance LLE Assessment LLE Assessment: Exceptions to [facility] General Strength Comments: hip grossly 3/5, knee ext 4-/ 5, knee flex 3/5, ankle 3+/5     Excell Seltzer, PT, DPT, CSRS 04/16/2022, 3:22 PM

## 2022-04-17 ENCOUNTER — Ambulatory Visit
Admit: 2022-04-17 | Discharge: 2022-04-17 | Disposition: A | Discharge status: Home / Self Care | Payer: Medicare Other | Attending: Radiation Oncology | Admitting: Radiation Oncology

## 2022-04-17 DIAGNOSIS — C7951 Secondary malignant neoplasm of bone: Secondary | ICD-10-CM

## 2022-04-17 DIAGNOSIS — C8338 Diffuse large B-cell lymphoma, lymph nodes of multiple sites: Secondary | ICD-10-CM

## 2022-04-17 LAB — GLUCOSE, CAPILLARY
Glucose-Capillary: 154 mg/dL — ABNORMAL HIGH (ref 70–99)
Glucose-Capillary: 149 mg/dL — ABNORMAL HIGH (ref 70–99)
Glucose-Capillary: 208 mg/dL — ABNORMAL HIGH (ref 70–99)
Glucose-Capillary: 202 mg/dL — ABNORMAL HIGH (ref 70–99)

## 2022-04-17 MED ORDER — INSULIN ASPART 100 UNIT/ML IJ SOLN
3.0000 [IU] | Freq: Three times a day (TID) | INTRAMUSCULAR | Status: DC
  Administered 2022-04-18 – 2022-04-24 (×16): 3 [IU] via SUBCUTANEOUS

## 2022-04-17 MED ORDER — SODIUM CHLORIDE 0.9 % IV SOLN
150.0000 mg | Freq: Once | INTRAVENOUS | Status: AC
  Administered 2022-04-17: 150 mg via INTRAVENOUS
  Filled 2022-04-17: qty 5

## 2022-04-17 MED ORDER — PALONOSETRON HCL INJECTION 0.25 MG/5ML
0.2500 mg | Freq: Once | INTRAVENOUS | Status: AC
  Administered 2022-04-17: 0.25 mg via INTRAVENOUS
  Filled 2022-04-17: qty 5

## 2022-04-17 MED ORDER — CHLORHEXIDINE GLUCONATE CLOTH 2 % EX PADS
6.0000 | MEDICATED_PAD | Freq: Every day | CUTANEOUS | Status: DC
  Administered 2022-04-18 – 2022-04-24 (×7): 6 via TOPICAL

## 2022-04-17 MED ORDER — SODIUM CHLORIDE 0.9 % IV BOLUS: 500.0000 mL | Freq: Once | INTRAVENOUS | Status: DC

## 2022-04-17 MED ORDER — TRAMADOL HCL 50 MG PO TABS
50.0000 mg | ORAL_TABLET | Freq: Once | ORAL | Status: AC
  Administered 2022-04-17: 50 mg via ORAL
  Filled 2022-04-17: qty 1

## 2022-04-17 MED ORDER — VINCRISTINE SULFATE CHEMO INJECTION 1 MG/ML
1.0000 mg | Freq: Once | INTRAVENOUS | Status: AC
  Administered 2022-04-17: 1 mg via INTRAVENOUS
  Filled 2022-04-17 (×3): qty 1

## 2022-04-17 MED ORDER — INSULIN ASPART 100 UNIT/ML IJ SOLN
0.0000 [IU] | Freq: Three times a day (TID) | INTRAMUSCULAR | Status: DC
  Administered 2022-04-17: 3 [IU] via SUBCUTANEOUS
  Administered 2022-04-17: 1 [IU] via SUBCUTANEOUS
  Administered 2022-04-18: 3 [IU] via SUBCUTANEOUS
  Administered 2022-04-18: 5 [IU] via SUBCUTANEOUS
  Administered 2022-04-18 – 2022-04-19 (×2): 3 [IU] via SUBCUTANEOUS
  Administered 2022-04-19 (×2): 5 [IU] via SUBCUTANEOUS
  Administered 2022-04-20: 3 [IU] via SUBCUTANEOUS
  Administered 2022-04-20: 9 [IU] via SUBCUTANEOUS
  Administered 2022-04-20: 7 [IU] via SUBCUTANEOUS
  Administered 2022-04-21 (×3): 3 [IU] via SUBCUTANEOUS
  Administered 2022-04-22 (×2): 2 [IU] via SUBCUTANEOUS
  Administered 2022-04-22: 5 [IU] via SUBCUTANEOUS
  Administered 2022-04-23 (×2): 1 [IU] via SUBCUTANEOUS
  Administered 2022-04-23 – 2022-04-24 (×3): 2 [IU] via SUBCUTANEOUS

## 2022-04-17 MED ORDER — SODIUM CHLORIDE 0.9 % IV SOLN
375.0000 mg/m2 | Freq: Once | INTRAVENOUS | Status: AC
  Administered 2022-04-17: 800 mg via INTRAVENOUS
  Filled 2022-04-17: qty 50

## 2022-04-17 MED ORDER — INSULIN ASPART 100 UNIT/ML IJ SOLN
0.0000 [IU] | Freq: Every day | INTRAMUSCULAR | Status: DC
  Administered 2022-04-17 – 2022-04-18 (×2): 2 [IU] via SUBCUTANEOUS
  Administered 2022-04-19: 3 [IU] via SUBCUTANEOUS
  Administered 2022-04-20: 4 [IU] via SUBCUTANEOUS
  Administered 2022-04-21: 2 [IU] via SUBCUTANEOUS

## 2022-04-17 MED ORDER — ACETAMINOPHEN 325 MG PO TABS
650.0000 mg | ORAL_TABLET | Freq: Once | ORAL | Status: AC
  Administered 2022-04-17: 650 mg via ORAL
  Filled 2022-04-17: qty 2

## 2022-04-17 MED ORDER — DOXORUBICIN HCL CHEMO IV INJECTION 2 MG/ML
25.0000 mg/m2 | Freq: Once | INTRAVENOUS | Status: AC
  Administered 2022-04-17: 54 mg via INTRAVENOUS
  Filled 2022-04-17 (×3): qty 27

## 2022-04-17 MED ORDER — ALUM & MAG HYDROXIDE-SIMETH 200-200-20 MG/5ML PO SUSP
15.0000 mL | Freq: Four times a day (QID) | ORAL | Status: DC | PRN
  Administered 2022-04-17: 15 mL via ORAL
  Filled 2022-04-17: qty 30

## 2022-04-17 MED ORDER — TRAMADOL HCL 50 MG PO TABS
50.0000 mg | ORAL_TABLET | Freq: Four times a day (QID) | ORAL | Status: DC | PRN
  Administered 2022-04-17 – 2022-04-20 (×5): 50 mg via ORAL
  Filled 2022-04-17 (×5): qty 1

## 2022-04-17 MED ORDER — SODIUM CHLORIDE 0.9 % IV SOLN
10.0000 mg | Freq: Once | INTRAVENOUS | Status: AC
  Administered 2022-04-17: 10 mg via INTRAVENOUS
  Filled 2022-04-17: qty 1

## 2022-04-17 MED ORDER — PROCHLORPERAZINE EDISYLATE 10 MG/2ML IJ SOLN
10.0000 mg | Freq: Four times a day (QID) | INTRAMUSCULAR | Status: DC | PRN
  Administered 2022-04-24: 10 mg via INTRAVENOUS
  Filled 2022-04-17: qty 2

## 2022-04-17 MED ORDER — SODIUM CHLORIDE 0.9 % IV SOLN
750.0000 mg/m2 | Freq: Once | INTRAVENOUS | Status: AC
  Administered 2022-04-17: 1600 mg via INTRAVENOUS
  Filled 2022-04-17 (×3): qty 80

## 2022-04-17 MED ORDER — DIPHENHYDRAMINE HCL 25 MG PO CAPS
50.0000 mg | ORAL_CAPSULE | Freq: Once | ORAL | Status: AC
  Administered 2022-04-17: 50 mg via ORAL
  Filled 2022-04-17: qty 2

## 2022-04-17 MED ORDER — ALLOPURINOL 100 MG PO TABS
100.0000 mg | ORAL_TABLET | Freq: Two times a day (BID) | ORAL | Status: DC
  Administered 2022-04-17 – 2022-04-24 (×16): 100 mg via ORAL
  Filled 2022-04-17 (×16): qty 1

## 2022-04-17 MED ORDER — TBO-FILGRASTIM 480 MCG/0.8ML ~~LOC~~ SOSY
480.0000 ug | PREFILLED_SYRINGE | Freq: Every day | SUBCUTANEOUS | Status: AC
  Administered 2022-04-18 – 2022-04-22 (×5): 480 ug via SUBCUTANEOUS
  Filled 2022-04-17 (×5): qty 0.8

## 2022-04-17 NOTE — Progress Notes (Signed)
Subjective: The patient is seen and examined today.  His wife was at the bedside.  The patient continues to complain of increasing fatigue and weakness in addition to the abdominal distention.  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  The patient denied having any fever or chills.  He has no nausea, vomiting.  He has no chest pain but continues to have the baseline shortness of breath as well as the significant general weakness but more in the lower extremities.  He had 2D echo that showed ejection fraction of 55-60% and hepatitis panel was negative.  Objective: Vital signs in last 24 hours: Temp:  [97.5 F (36.4 C)-98.4 F (36.9 C)] 98.4 F (36.9 C) (06/01 0535) Pulse Rate:  [78-103] 95 (06/01 0535) Resp:  [13-23] 18 (06/01 0535) BP: (93-118)/(58-73) 105/69 (06/01 0535) SpO2:  [92 %-98 %] 92 % (06/01 0745)  Intake/Output from previous day: 05/31 0701 - 06/01 0700 In: 720 [P.O.:720] Out: 1050 [Urine:1050] Intake/Output this shift: No intake/output data recorded.  General appearance: alert, cooperative, fatigued, and no distress Resp: clear to auscultation bilaterally Cardio: regular rate and rhythm, S1, S2 normal, no murmur, click, rub or gallop GI: Distended Extremities: extremities normal, atraumatic, no cyanosis or edema  Lab Results:  Recent Labs    03/17/2022 1814 04/16/22 0600  WBC 3.8* 4.2  HGB 10.2* 10.0*  HCT 31.5* 30.4*  PLT 157 134*   BMET Recent Labs    04/02/2022 1814 04/16/22 0600  NA 136 136  K 5.2* 5.3*  CL 99 101  CO2 26 24  GLUCOSE 160* 166*  BUN 43* 44*  CREATININE 1.02 0.99  CALCIUM 9.6 9.5    Studies/Results: ECHOCARDIOGRAM COMPLETE  Result Date: 03/30/2022    ECHOCARDIOGRAM REPORT   Patient Name:   Austin Oliver Date of Exam: 04/09/2022 Medical Rec #:  [id number]      Height:       69.0 in Accession #:     [id number]     Weight:       203.0 lb Date of Birth:  08/02/1956      BSA:          2.079 m Patient Age:    29 years       BP:           112/78 mmHg Patient Gender: M              HR:           66 bpm. Exam Location:  Inpatient Procedure: 2D Echo, Cardiac Doppler, Color Doppler and Strain Analysis Indications:    Chemo  History:        Patient has prior history of Echocardiogram examinations, most                 recent 11/20/2021. COPD; Risk Factors:Hypertension and                 Dyslipidemia. 04/25/21 cath                 12/04/20 pacemaker.  Sonographer:    Luisa Hart RDCS Referring Phys: Allen  1. Left ventricular ejection fraction, by estimation, is 55 to 60%. The left ventricle has normal function. The left ventricle has no regional wall motion abnormalities. Left ventricular diastolic parameters were normal.  2. Right ventricular systolic function is normal. The right  ventricular size is normal.  3. The mitral valve is normal in structure. Trivial mitral valve regurgitation.  4. The aortic valve is tricuspid. Aortic valve regurgitation is trivial. Aortic valve sclerosis is present, with no evidence of aortic valve stenosis. Comparison(s): The left ventricular function is unchanged. FINDINGS  Left Ventricle: Left ventricular ejection fraction, by estimation, is 55 to 60%. The left ventricle has normal function. The left ventricle has no regional wall motion abnormalities. The left ventricular internal cavity size was normal in size. There is  no left ventricular hypertrophy. Left ventricular diastolic parameters were normal. Right Ventricle: The right ventricular size is normal. Right vetricular wall thickness was not assessed. Right ventricular systolic function is normal. Left Atrium: Left atrial size was normal in size. Right Atrium: Right atrial size was normal in size. Pericardium: There is no evidence of pericardial effusion. Mitral Valve: The mitral valve is normal in  structure. Trivial mitral valve regurgitation. MV peak gradient, 2.2 mmHg. The mean mitral valve gradient is 1.0 mmHg. Tricuspid Valve: The tricuspid valve is normal in structure. Tricuspid valve regurgitation is trivial. Aortic Valve: The aortic valve is tricuspid. Aortic valve regurgitation is trivial. Aortic regurgitation PHT measures 636 msec. Aortic valve sclerosis is present, with no evidence of aortic valve stenosis. Aortic valve mean gradient measures 3.3 mmHg. Aortic valve peak gradient measures 6.7 mmHg. Aortic valve area, by VTI measures 3.28 cm. Pulmonic Valve: The pulmonic valve was grossly normal. Pulmonic valve regurgitation is not visualized. Aorta: The aortic root and ascending aorta are structurally normal, with no evidence of dilitation. IAS/Shunts: No atrial level shunt detected by color flow Doppler.  LEFT VENTRICLE PLAX 2D LVIDd:         5.10 cm     Diastology LVIDs:         3.40 cm     LV e' medial:    7.07 cm/s LV PW:         1.00 cm     LV E/e' medial:  7.4 LV IVS:        1.00 cm     LV e' lateral:   14.00 cm/s LVOT diam:     2.20 cm     LV E/e' lateral: 3.7 LV SV:         79 LV SV Index:   38 LVOT Area:     3.80 cm  LV Volumes (MOD) LV vol d, MOD A2C: 42.3 ml LV vol d, MOD A4C: 99.0 ml LV vol s, MOD A2C: 18.9 ml LV vol s, MOD A4C: 39.8 ml LV SV MOD A2C:     23.4 ml LV SV MOD A4C:     99.0 ml LV SV MOD BP:      36.8 ml RIGHT VENTRICLE RV Basal diam:  3.00 cm RV Mid diam:    3.00 cm RV S prime:     13.60 cm/s TAPSE (M-mode): 1.3 cm LEFT ATRIUM           Index        RIGHT ATRIUM           Index LA diam:      3.40 cm 1.64 cm/m   RA Area:     13.60 cm LA Vol (A4C): 31.1 ml 14.96 ml/m  RA Volume:   33.00 ml  15.87 ml/m  AORTIC VALVE                    PULMONIC VALVE AV Area (Vmax):  3.27 cm     PV Vmax:       1.38 m/s AV Area (Vmean):   3.04 cm     PV Vmean:      87.300 cm/s AV Area (VTI):     3.28 cm     PV VTI:        0.258 m AV Vmax:           129.00 cm/s  PV Peak grad:  7.7 mmHg  AV Vmean:          84.400 cm/s  PV Mean grad:  3.5 mmHg AV VTI:            0.241 m AV Peak Grad:      6.7 mmHg AV Mean Grad:      3.3 mmHg LVOT Vmax:         111.00 cm/s LVOT Vmean:        67.450 cm/s LVOT VTI:          0.208 m LVOT/AV VTI ratio: 0.86 AI PHT:            636 msec  AORTA Ao Root diam: 3.50 cm Ao Asc diam:  2.80 cm MITRAL VALVE               TRICUSPID VALVE MV Area (PHT): 3.03 cm    TR Peak grad:   25.0 mmHg MV Area VTI:   3.44 cm    TR Vmax:        250.00 cm/s MV Peak grad:  2.2 mmHg MV Mean grad:  1.0 mmHg    SHUNTS MV Vmax:       0.75 m/s    Systemic VTI:  0.21 m MV Vmean:      45.9 cm/s   Systemic Diam: 2.20 cm MV Decel Time: 250 msec MV E velocity: 52.30 cm/s MV A velocity: 73.30 cm/s MV E/A ratio:  0.71 Dorris Carnes MD Electronically signed by Dorris Carnes MD Signature Date/Time: 04/03/2022/12:11:06 PM    Final    IR IMAGING GUIDED PORT INSERTION  Result Date: 04/17/2022 CLINICAL DATA:  Lymphoma, needs durable venous access for planned treatment regimen EXAM: TUNNELED PORT CATHETER PLACEMENT WITH ULTRASOUND AND FLUOROSCOPIC GUIDANCE FLUOROSCOPY: Radiation Exposure Index (as provided by the fluoroscopic device): 29 mGy air Kerma ANESTHESIA/SEDATION: Intravenous Fentanyl 125mg and Versed '2mg'$  were administered as conscious sedation during continuous monitoring of the patient's level of consciousness and physiological / cardiorespiratory status by the radiology RN, with a total moderate sedation time of 22 minutes. TECHNIQUE: The procedure, risks, benefits, and alternatives were explained to the patient. Questions regarding the procedure were encouraged and answered. The patient understands and consents to the procedure. Patency of the right IJ vein was confirmed with ultrasound with image documentation. An appropriate skin site was determined. Skin site was marked. Region was prepped using maximum barrier technique including cap and mask, sterile gown, sterile gloves, large sterile sheet, and  Chlorhexidine as cutaneous antisepsis. The region was infiltrated locally with 1% lidocaine. Under real-time ultrasound guidance, the right IJ vein was accessed with a 21 gauge micropuncture needle; the needle tip within the vein was confirmed with ultrasound image documentation. Needle was exchanged over a 018 guidewire for transitional dilator, and vascular measurement was performed. A small incision was made on the right anterior chest wall and a subcutaneous pocket fashioned. The power-injectable port was positioned and its catheter tunneled to the right IJ dermatotomy site. The transitional dilator was exchanged over an Amplatz wire for a  peel-away sheath, through which the port catheter, which had been trimmed to the appropriate length, was advanced and positioned under fluoroscopy with its tip at the cavoatrial junction. Spot chest radiograph confirms good catheter position and no pneumothorax. The port was flushed per protocol. The pocket was closed with deep interrupted and subcuticular continuous 3-0 Monocryl sutures. The incisions were covered with Dermabond then covered with a sterile dressing. The patient tolerated the procedure well. COMPLICATIONS: COMPLICATIONS None immediate IMPRESSION: Technically successful right IJ power-injectable port catheter placement. Ready for routine use. Electronically Signed   By: Lucrezia Europe M.D.   On: 04/17/2022 08:00    Medications: I have reviewed the patient's current medications.   Assessment/Plan: This is a very pleasant 66 years old white male recently diagnosed with diffuse large B cell non-Hodgkin lymphoma involving abdominal lymphadenopathy in addition to liver lesions and suspicious bone metastasis. I had a lengthy discussion with the patient and his wife again about his current disease stage, prognosis and treatment options. I explained to the patient that without treatment his prognosis is very poor and survival probably in the range of 3-6  months. The patient also understands that with treatment he would have a chance for surviving longer and also cure in around 50% of the patient with his disease but unfortunately has more worse risk factors. I reminded the patient that he still have the option of no treatment and just palliative care versus proceeding with systemic chemotherapy with R-CHOP as previously planned. I discussed with the patient and his wife the adverse effect of this treatment including but not limited to alopecia, myelosuppression, nausea and vomiting, peripheral neuropathy, liver or renal dysfunction. The patient and his wife would like to proceed with the treatment as planned. He had a Port-A-Cath placed for the treatment. His dose of vincristine and doxorubicin will be reduced because of his liver dysfunction. We will start the patient on growth factor tomorrow after completing his chemotherapy today. We will continue to monitor tumor lysis closely during his hospitalization.  Please continue to monitor his electrolytes including potassium, calcium, magnesium, phosphorus and uric acid.  We will start the patient empirically on allopurinol 100 mg p.o. twice daily. If the patient feels better by early next week, he may return back to the [facility] inpatient rehabilitation or other skilled nursing facility for rehabilitation. I will be out of town for the next several days for an oncology national conference ([facility]).  I will ask one of my partners, the on-call oncologist for our service to keep an eye on the patient the next few days until my return on Tuesday, 04/22/2022. Thank you for taking good care of Mr. Etchison, we will continue to monitor the patient with you and assist in his management.  LOS: 2 days    Eilleen Kempf 04/17/2022

## 2022-04-17 NOTE — Progress Notes (Addendum)
[facility] PROGRESS NOTE    Progress Note  Austin Oliver  IWP:[id number] DOB: 1956-06-20 DOA: 04/03/2022 PCP: [name], MD     Brief Narrative:   Austin Oliver is an 66 y.o. 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 Oncology has been consulted. IR has been consulted for port placement Oncology to start chemotherapy 6.1.2023, cycle every 21 days of prednisone rituximab cyclophosphamide, doxorubicin and vincristine. We will monitor for tumor lysis syndrome overnight.  History of recurrent CVA and history of spinal cord infarction [facility]): In by neurologist on last admission concerned about hypercoagulable state due to his spinal cord infarct on the previous admission. Continue aspirin and Plavix. Post Port-A-Cath placement on 04/16/2022 Continue Crestor and Zetia.  Essential hypertension: Blood pressure soft continue to hold home regimen.  COPD: Resume home regimen.  Sick sinus syndrome: Continue current home regimen.    CAD/hyperlipidemia: Continue Crestor and statins.  Pituitary microadenoma: Follow-up with neurosurgery as an outpatient.  BPH: Continue Flomax and finasteride continue Foley.  Diabetes mellitus type 2 with hyperglycemia: With a last A1c of 5.4.  Tolerating his diet Continue sliding scale insulin.  Morbid  obesity: Noted.    DVT prophylaxis: lovenox Family Communication:none Status is: Inpatient Remains inpatient appropriate because: Newly diagnosed high-grade lymphoma starting chemotherapy as an inpatient.    Code Status:     Code Status Orders  (From admission, onward)           Start     Ordered   04/14/2022 1701  Full code  Continuous        03/18/2022 1703           Code Status History     Date Active Date Inactive Code Status Order ID Comments User Context   04/04/2022 1429 03/19/2022 1531 Full Code [id number]  Flora Lipps Inpatient   03/25/2022 1919 04/04/2022 1424 Full Code [id number]  Elodia Florence., MD ED   03/16/2022 1557 03/18/2022 2147 Full Code [id number]  Domenic Polite, MD Inpatient   11/20/2021 2246 11/23/2021 2005 Full Code [id number]  Florencia Reasons, MD ED   10/31/2021 1747 11/01/2021 2024 Full Code [id number]  Rod Can, MD Inpatient   04/25/2021 1300 04/25/2021 2113 Full Code [id number]  [name], MD Inpatient   12/04/2020 1205 12/04/2020 2214 Full Code [id number]  [name], MD Inpatient   08/24/2020 1328 08/24/2020 2238 Full Code [id number]  [name], MD Inpatient   03/09/2017 1810 03/11/2017 1707 Full Code [id number]  Rod Can, MD Inpatient         IV Access:   Peripheral IV   Procedures and diagnostic studies:   ECHOCARDIOGRAM COMPLETE  Result Date: 04/02/2022    ECHOCARDIOGRAM REPORT   Patient Name:   Austin Oliver Date of Exam: 03/25/2022 Medical Rec #:  [id number]      Height:       69.0 in Accession #:    [id number]  Weight:       203.0 lb Date of Birth:  11/28/1955      BSA:          2.079 m Patient Age:    27 years       BP:           112/78 mmHg Patient Gender: M              HR:           66 bpm. Exam Location:  Inpatient Procedure: 2D Echo, Cardiac Doppler, Color Doppler and Strain Analysis Indications:    Chemo  History:        Patient has prior history of Echocardiogram examinations, most                  recent 11/20/2021. COPD; Risk Factors:Hypertension and                 Dyslipidemia. 04/25/21 cath                 12/04/20 pacemaker.  Sonographer:    Luisa Hart RDCS Referring Phys: [place]  1. Left ventricular ejection fraction, by estimation, is 55 to 60%. The left ventricle has normal function. The left ventricle has no regional wall motion abnormalities. Left ventricular diastolic parameters were normal.  2. Right ventricular systolic function is normal. The right ventricular size is normal.  3. The mitral valve is normal in structure. Trivial mitral valve regurgitation.  4. The aortic valve is tricuspid. Aortic valve regurgitation is trivial. Aortic valve sclerosis is present, with no evidence of aortic valve stenosis. Comparison(s): The left ventricular function is unchanged. FINDINGS  Left Ventricle: Left ventricular ejection fraction, by estimation, is 55 to 60%. The left ventricle has normal function. The left ventricle has no regional wall motion abnormalities. The left ventricular internal cavity size was normal in size. There is  no left ventricular hypertrophy. Left ventricular diastolic parameters were normal. Right Ventricle: The right ventricular size is normal. Right vetricular wall thickness was not assessed. Right ventricular systolic function is normal. Left Atrium: Left atrial size was normal in size. Right Atrium: Right atrial size was normal in size. Pericardium: There is no evidence of pericardial effusion. Mitral Valve: The mitral valve is normal in structure. Trivial mitral valve regurgitation. MV peak gradient, 2.2 mmHg. The mean mitral valve gradient is 1.0 mmHg. Tricuspid Valve: The tricuspid valve is normal in structure. Tricuspid valve regurgitation is trivial. Aortic Valve: The aortic valve is tricuspid. Aortic valve regurgitation is trivial. Aortic regurgitation PHT measures 636 msec. Aortic valve sclerosis is present, with no evidence of aortic valve  stenosis. Aortic valve mean gradient measures 3.3 mmHg. Aortic valve peak gradient measures 6.7 mmHg. Aortic valve area, by VTI measures 3.28 cm. Pulmonic Valve: The pulmonic valve was grossly normal. Pulmonic valve regurgitation is not visualized. Aorta: The aortic root and ascending aorta are structurally normal, with no evidence of dilitation. IAS/Shunts: No atrial level shunt detected by color flow Doppler.  LEFT VENTRICLE PLAX 2D LVIDd:         5.10 cm     Diastology LVIDs:         3.40 cm     LV e' medial:    7.07 cm/s LV PW:         1.00 cm     LV E/e' medial:  7.4 LV IVS:        1.00 cm     LV  e' lateral:   14.00 cm/s LVOT diam:     2.20 cm     LV E/e' lateral: 3.7 LV SV:         79 LV SV Index:   38 LVOT Area:     3.80 cm  LV Volumes (MOD) LV vol d, MOD A2C: 42.3 ml LV vol d, MOD A4C: 99.0 ml LV vol s, MOD A2C: 18.9 ml LV vol s, MOD A4C: 39.8 ml LV SV MOD A2C:     23.4 ml LV SV MOD A4C:     99.0 ml LV SV MOD BP:      36.8 ml RIGHT VENTRICLE RV Basal diam:  3.00 cm RV Mid diam:    3.00 cm RV S prime:     13.60 cm/s TAPSE (M-mode): 1.3 cm LEFT ATRIUM           Index        RIGHT ATRIUM           Index LA diam:      3.40 cm 1.64 cm/m   RA Area:     13.60 cm LA Vol (A4C): 31.1 ml 14.96 ml/m  RA Volume:   33.00 ml  15.87 ml/m  AORTIC VALVE                    PULMONIC VALVE AV Area (Vmax):    3.27 cm     PV Vmax:       1.38 m/s AV Area (Vmean):   3.04 cm     PV Vmean:      87.300 cm/s AV Area (VTI):     3.28 cm     PV VTI:        0.258 m AV Vmax:           129.00 cm/s  PV Peak grad:  7.7 mmHg AV Vmean:          84.400 cm/s  PV Mean grad:  3.5 mmHg AV VTI:            0.241 m AV Peak Grad:      6.7 mmHg AV Mean Grad:      3.3 mmHg LVOT Vmax:         111.00 cm/s LVOT Vmean:        67.450 cm/s LVOT VTI:          0.208 m LVOT/AV VTI ratio: 0.86 AI PHT:            636 msec  AORTA Ao Root diam: 3.50 cm Ao Asc diam:  2.80 cm MITRAL VALVE               TRICUSPID VALVE MV Area (PHT): 3.03 cm    TR Peak grad:    25.0 mmHg MV Area VTI:   3.44 cm    TR Vmax:        250.00 cm/s MV Peak grad:  2.2 mmHg MV Mean grad:  1.0 mmHg    SHUNTS MV Vmax:       0.75 m/s    Systemic VTI:  0.21 m MV Vmean:      45.9 cm/s   Systemic Diam: 2.20 cm MV Decel Time: 250 msec MV E velocity: 52.30 cm/s MV A velocity: 73.30 cm/s MV E/A ratio:  0.71 Dorris Carnes MD Electronically signed by Dorris Carnes MD Signature Date/Time: 03/20/2022/12:11:06 PM    Final    IR IMAGING GUIDED PORT INSERTION  Result Date: 04/17/2022 CLINICAL DATA:  Lymphoma, needs durable venous access for planned treatment regimen EXAM: TUNNELED PORT CATHETER PLACEMENT WITH ULTRASOUND AND FLUOROSCOPIC GUIDANCE FLUOROSCOPY: Radiation Exposure Index (as provided by the fluoroscopic device): 29 mGy air Kerma ANESTHESIA/SEDATION: Intravenous Fentanyl 13mg and Versed '2mg'$  were administered as conscious sedation during continuous monitoring of the patient's level of consciousness and physiological / cardiorespiratory status by the radiology RN, with a total moderate sedation time of 22 minutes. TECHNIQUE: The procedure, risks, benefits, and alternatives were explained to the patient. Questions regarding the procedure were encouraged and answered. The patient understands and consents to the procedure. Patency of the right IJ vein was confirmed with ultrasound with image documentation. An appropriate skin site was determined. Skin site was marked. Region was prepped using maximum barrier technique including cap and mask, sterile gown, sterile gloves, large sterile sheet, and Chlorhexidine as cutaneous antisepsis. The region was infiltrated locally with 1% lidocaine. Under real-time ultrasound guidance, the right IJ vein was accessed with a 21 gauge micropuncture needle; the needle tip within the vein was confirmed with ultrasound image documentation. Needle was exchanged over a 018 guidewire for transitional dilator, and vascular measurement was performed. A small incision was made on the  right anterior chest wall and a subcutaneous pocket fashioned. The power-injectable port was positioned and its catheter tunneled to the right IJ dermatotomy site. The transitional dilator was exchanged over an Amplatz wire for a peel-away sheath, through which the port catheter, which had been trimmed to the appropriate length, was advanced and positioned under fluoroscopy with its tip at the cavoatrial junction. Spot chest radiograph confirms good catheter position and no pneumothorax. The port was flushed per protocol. The pocket was closed with deep interrupted and subcuticular continuous 3-0 Monocryl sutures. The incisions were covered with Dermabond then covered with a sterile dressing. The patient tolerated the procedure well. COMPLICATIONS: COMPLICATIONS None immediate IMPRESSION: Technically successful right IJ power-injectable port catheter placement. Ready for routine use. Electronically Signed   By: DLucrezia EuropeM.D.   On: 04/17/2022 08:00     Medical Consultants:   None.   Subjective:    Austin ANDREONIhas no new complaints.  Objective:    Vitals:   04/16/22 1949 04/17/22 0535 04/17/22 0744 04/17/22 0745  BP: 110/64 105/69    Pulse: 99 95    Resp: 16 18    Temp: 98.1 F (36.7 C) 98.4 F (36.9 C)    TempSrc: Oral Oral    SpO2: 95% 96% 92% 92%  Weight:      Height:       SpO2: 92 % O2 Flow Rate (L/min): 2 L/min   Intake/Output Summary (Last 24 hours) at 04/17/2022 1015 Last data filed at 04/17/2022 1010 Gross per 24 hour  Intake 955 ml  Output 1050 ml  Net -95 ml    Filed Weights   04/01/2022 1744  Weight: 93 kg    Exam: General exam: In no acute distress. Respiratory system: Good air movement and clear to auscultation. Cardiovascular system: S1 & S2 heard, RRR. No JVD. Gastrointestinal system: Abdomen is nondistended, soft and nontender.  Extremities: No pedal edema. Skin: No rashes, lesions or ulcers Psychiatry: Judgement and insight appear normal. Mood &  affect appropriate.   Data Reviewed:    Labs: Basic Metabolic Panel: Recent Labs  Lab 04/11/22 0536 04/12/22 0539 04/14/22 0609 04/08/2022 1814 04/16/22 0600  NA 129* 132* 132* 136 136  K 4.1 4.6 4.4 5.2* 5.3*  CL 92* 95* 97* 99 101  CO2  $'23 23 23 26 24  'p$ GLUCOSE 109* 164* 190* 160* 166*  BUN 32* 30* 30* 43* 44*  CREATININE 1.32* 1.05 1.15 1.02 0.99  CALCIUM 8.2* 8.6* 9.2 9.6 9.5    GFR Estimated Creatinine Clearance: 81.5 mL/min (by C-G formula based on SCr of 0.99 mg/dL). Liver Function Tests: Recent Labs  Lab 04/14/22 0609 04/05/2022 1814 04/16/22 0600  AST 31 31 34  ALT '29 27 27  '$ ALKPHOS 113 109 102  BILITOT 2.0* 2.7* 2.8*  PROT 5.4* 5.9* 5.5*  ALBUMIN 1.9* 2.2* 2.2*    No results for input(s): LIPASE, AMYLASE in the last 168 hours. No results for input(s): AMMONIA in the last 168 hours. Coagulation profile No results for input(s): INR, PROTIME in the last 168 hours. COVID-19 Labs  No results for input(s): DDIMER, FERRITIN, LDH, CRP in the last 72 hours.  Lab Results  Component Value Date   SARSCOV2NAA NEGATIVE 03/25/2022   SARSCOV2NAA NEGATIVE 03/16/2022   SARSCOV2NAA POSITIVE (A) 11/20/2021   SARSCOV2NAA RESULT: NEGATIVE 10/29/2021    CBC: Recent Labs  Lab 04/14/22 0609 04/16/2022 1814 04/16/22 0600  WBC 4.2 3.8* 4.2  NEUTROABS  --  3.0  --   HGB 9.7* 10.2* 10.0*  HCT 30.2* 31.5* 30.4*  MCV 88.8 89.2 89.4  PLT 156 157 134*    Cardiac Enzymes: No results for input(s): CKTOTAL, CKMB, CKMBINDEX, TROPONINI in the last 168 hours. BNP (last 3 results) No results for input(s): PROBNP in the last 8760 hours. CBG: Recent Labs  Lab 04/16/22 0716 04/16/22 1212 04/16/22 1706 04/16/22 2103 04/17/22 0721  GLUCAP 160* 180* 182* 224* 154*    D-Dimer: No results for input(s): DDIMER in the last 72 hours. Hgb A1c: No results for input(s): HGBA1C in the last 72 hours. Lipid Profile: No results for input(s): CHOL, HDL, LDLCALC, TRIG, CHOLHDL,  LDLDIRECT in the last 72 hours. Thyroid function studies: No results for input(s): TSH, T4TOTAL, T3FREE, THYROIDAB in the last 72 hours.  Invalid input(s): FREET3 Anemia work up: No results for input(s): VITAMINB12, FOLATE, FERRITIN, TIBC, IRON, RETICCTPCT in the last 72 hours. Sepsis Labs: Recent Labs  Lab 04/14/22 0609 03/31/2022 1814 04/16/22 0600  WBC 4.2 3.8* 4.2    Microbiology Recent Results (from the past 240 hour(s))  Urine Culture     Status: Abnormal   Collection Time: 04/09/22 11:55 AM   Specimen: Urine, Catheterized  Result Value Ref Range Status   Specimen Description URINE, CATHETERIZED  Final   Special Requests   Final    NONE Performed at [facility], [address], [place], [place] [address]    Culture 40,000 COLONIES/mL STAPHYLOCOCCUS EPIDERMIDIS (A)  Final   Report Status 04/11/2022 FINAL  Final   Organism ID, Bacteria STAPHYLOCOCCUS EPIDERMIDIS (A)  Final      Susceptibility   Staphylococcus epidermidis - MIC*    CIPROFLOXACIN <=0.5 SENSITIVE Sensitive     GENTAMICIN <=0.5 SENSITIVE Sensitive     NITROFURANTOIN <=16 SENSITIVE Sensitive     OXACILLIN >=4 RESISTANT Resistant     TETRACYCLINE 2 SENSITIVE Sensitive     VANCOMYCIN 2 SENSITIVE Sensitive     TRIMETH/SULFA 20 SENSITIVE Sensitive     CLINDAMYCIN 4 RESISTANT Resistant     RIFAMPIN <=0.5 SENSITIVE Sensitive     Inducible Clindamycin NEGATIVE Sensitive     * 40,000 COLONIES/mL STAPHYLOCOCCUS EPIDERMIDIS     Medications:    acetaminophen  650 mg Oral Once   allopurinol  100 mg Oral BID  aspirin EC  81 mg Oral QPM   Chlorhexidine Gluconate Cloth  6 each Topical Daily   clopidogrel  75 mg Oral Daily   cyclophosphamide  750 mg/m2 (Treatment Plan Recorded) Intravenous Once   diphenhydrAMINE  50 mg Oral Once   docusate sodium  100 mg Oral BID   DOXOrubicin  25 mg/m2 (Treatment Plan Recorded) Intravenous Once   enoxaparin (LOVENOX) injection  40 mg Subcutaneous Q24H   ezetimibe  10  mg Oral q AM   finasteride  5 mg Oral QHS   gabapentin  200 mg Oral QHS   insulin aspart  0-6 Units Subcutaneous TID WC   levothyroxine  112 mcg Oral QAC breakfast   palonosetron  0.25 mg Intravenous Once   pantoprazole  40 mg Oral QPC supper   riTUXimab-pvvr (RUXIENCE) IV infusion  375 mg/m2 (Treatment Plan Recorded) Intravenous Once   rosuvastatin  5 mg Oral Daily   tamsulosin  0.8 mg Oral Q supper   [START ON 04/18/2022] Tbo-filgastrim (GRANIX) SQ  480 mcg Subcutaneous Daily   umeclidinium bromide  1 puff Inhalation Daily   vinCRIStine (ONCOVIN) CHEMO IV infusion  1 mg Intravenous Once   vitamin B-12  1,000 mcg Oral q AM   Continuous Infusions:  dexamethasone (DECADRON) IVPB ([facility])     fosaprepitant (EMEND) IV infusion 150 mg        LOS: 2 days   Charlynne Cousins  [facility]  04/17/2022, 10:15 AM

## 2022-04-17 NOTE — Progress Notes (Signed)
D/C'd C1D3 per Dr. Julien Nordmann pt will likely be receiving short-acting G-CSF at rehab facility upon discharge.  Kennith Center, Pharm.D., CPP 04/17/2022'@9'$ :44 AM

## 2022-04-17 NOTE — Progress Notes (Signed)
Inpatient Rehabilitation Admissions Coordinator   Noted plans as outlined by Dr Julien Nordmann. I continue to recommend PT and OT consults. Plans for rehab venue will depend on overall plans and progress. [employer] medicare would have to approve any rehab venue. Last CIR admit was approved on appeal. I will alert acute team and TOC.  Danne Baxter, RN, MSN Rehab Admissions Coordinator [phone] 04/17/2022 11:13 AM

## 2022-04-17 NOTE — Progress Notes (Signed)
   04/17/22 1050  Treat  Pain Score 2  Take Vital Signs  Increase Vital Sign Frequency  Yellow: Q 2hr X 2 then Q 4hr X 2, if remains yellow, continue Q 4hrs  Notify: Charge Nurse/RN  Name of Charge Nurse/RN Notified Kaitlyn  Date Charge Nurse/RN Notified 04/17/22  Time Charge Nurse/RN Notified 1100  Notify: Provider  Provider Name/Title Dr Aileen Fass  Date Provider Notified 04/17/22  Time Provider Notified 1100  Method of Notification Page  Notification Reason Change in status  Provider response See new orders  Date of Provider Response 04/17/22  Time of Provider Response 1120  Document  Patient Outcome Other (Comment)  Progress note created (see row info) Yes

## 2022-04-17 NOTE — Progress Notes (Signed)
Patients Rituxan was started at 1730. Within 10 to 15 minutes patient started to complain of abdominal pain. It moved from the RLQ to all across the lower abdomen and up across the upper and mid abdomen. Patient was to the point of tears, pain 10/10. Rituxan was stopped. Dr Aileen Fass was notified, he asked that I reach out to the Oncologist. Dr Irene Limbo was called and advised that we hold Ritixuan for the night and let day shift try again in the morning. Will continue to monitor.

## 2022-04-17 NOTE — Consult Note (Signed)
Radiation Oncology         [phone] ________________________________  Initial inpatient Consultation  Name: LEMARIO CHAIKIN MRN: [id number]  Date of Service: 03/17/2022 DOB: 1956-04-12  NO:[facility], Curt Jews, MD  Curt Bears, MD   REFERRING PHYSICIAN: Curt Bears, MD  DIAGNOSIS: 66 year old male with recently diagnosed with diffuse large B cell non-Hodgkin lymphoma involving abdominal lymphadenopathy in addition to liver metastasis and suspicious bone metastasis.    ICD-10-CM   1. Diffuse large B-cell lymphoma of intra-abdominal lymph nodes (HCC)  C83.33 DOXOrubicin (ADRIAMYCIN) chemo injection 54 mg    vinCRIStine (ONCOVIN) 1 mg in sodium chloride 0.9 % 50 mL chemo infusion    allopurinol (ZYLOPRIM) tablet 100 mg    Tbo-Filgrastim (GRANIX) injection 480 mcg    acetaminophen (TYLENOL) tablet 650 mg    diphenhydrAMINE (BENADRYL) capsule 50 mg    fosaprepitant (EMEND) 150 mg in sodium chloride 0.9 % 145 mL IVPB    dexamethasone (DECADRON) 10 mg in sodium chloride 0.9 % 50 mL IVPB    palonosetron (ALOXI) injection 0.25 mg    cyclophosphamide (CYTOXAN) 1,600 mg in sodium chloride 0.9 % 250 mL chemo infusion    riTUXimab-pvvr (RUXIENCE) 800 mg in sodium chloride 0.9 % 250 mL (2.4242 mg/mL) infusion      HISTORY OF PRESENT ILLNESS: MOISHE SCHELLENBERG is a 66 y.o. male seen at the request of Dr. Julien Nordmann.  He had a stroke in January 2023 with some residual left-sided weakness but initially presented to the emergency department on 03/16/2022 with an acute onset that morning feeling weak all over and unable to get out of bed or stand.  Once he was able to get out of bed he noticed that he was dragging his right leg and felt weak and shaky.  An MRI brain on admission showed bilateral multifocal acute infarcts, more at left MCA and PCA territory and some in the watershed area with one single infarct at right cerebellum per neurology, and not consistent with a demyelinating process as  noted in the radiology report.  He was discharged home on 03/18/2022 with a Foley catheter due to AUR, but presented back to the emergency department on 03/25/2022 due to recurrent falls at home as well as progressive weakness in the right lower extremity with associated swelling.  A repeat MRI brain on 03/27/2022 showed multifocal chronic infarcts. There were white matter changes similar in extent to the most recent MRI but progressive since November 20, 2021, however, per neurology, these lesions showed restricted diffusion on prior admissions c/w acute multifocal ischemic infarcts.  There was also a 5 mm hypoenhancing nodule in the anterior pituitary felt most likely to represent a pituitary microadenoma.  MRI C/T/L-spine showed abnormal cord signal intensity at the T3 and T4 levels suggesting cord ischemia or possibly transverse myelitis but no enhancement to suggest neoplastic process.  There were several areas of abnormal T1 marrow signal and contrast-enhancement worrisome for metastatic disease or lymphoma as well as bilateral adrenal lesions and borderline retroperitoneal lymph nodes. He had a CT C/A/P on 03/29/2022 which demonstrated numerous hypodensities throughout the liver, splenomegaly, bilateral adrenal thickening and diffuse lymphadenopathy within the upper abdomen and retroperitoneum consistent with metastatic disease or lymphoma.  This suspected metastatic disease within the T4 vertebral body and L3-S1 vertebral bodies on prior MRI is occult by CT.  No destructive bony lesions.  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  He was discharged to inpatient rehab on 04/04/2022 for  PT/OT.  He underwent an ultrasound-guided left liver lesion biopsy on 04/07/2022 which confirmed diffuse large B-cell lymphoma.  Dr. Earlie Server met with the patient to discuss his diagnosis and treatment options I recommended port placement and transferred to [facility] long hospital to start  chemotherapy with R-CHOP.  Repeat MRI T and L-spine were performed on 04/12/2022 and again showing abnormal increased T2 signal within the central aspect of the cord fairly diffusely at the T3/T4 levels.  There appears to be resolution of the prior mild cord swelling/enlargement with findings favored secondary to cord ischemia or transverse myelitis with no definite enhancement to suggest a neoplastic process.  There is decreased T1 and increased T2/STIR signal with enhancement within the C7, T1 and T4 vertebral bodies, suspicious for metastatic disease as well as probable bone marrow signal abnormality within the L3-S1 vertebral bodies, concerning for possible metastatic disease.  We have been asked to consult the patient for consideration of palliative radiotherapy to the lumbar spine.    PREVIOUS RADIATION THERAPY: No  PAST MEDICAL HISTORY:  Past Medical History:  Diagnosis Date   Cirrhosis of liver ([place])    COPD (chronic obstructive pulmonary disease) ([place])    Coronary artery calcification seen on CAT scan 01/14/2017   Essential hypertension 09/11/2020   GERD (gastroesophageal reflux disease)    History of kidney stones    Hyperlipidemia    Hypothyroidism    OSA on CPAP    Mild with AHI 7.8/hr >>on CPAP   Osteoarthritis    Presence of permanent cardiac pacemaker    Shingles 2012      PAST SURGICAL HISTORY: Past Surgical History:  Procedure Laterality Date   CORONARY STENT INTERVENTION N/A 08/24/2020   Procedure: CORONARY STENT INTERVENTION;  Surgeon: [name], MD;  Location: [place] [facility];  Service: Cardiovascular;  Laterality: N/A;   HEEL SPUR SURGERY Left 80's   INTRAVASCULAR PRESSURE WIRE/FFR STUDY N/A 04/25/2021   Procedure: INTRAVASCULAR PRESSURE WIRE/FFR STUDY;  Surgeon: [name], MD;  Location: [place] [facility];  Service: Cardiovascular;  Laterality: N/A;   INTRAVASCULAR ULTRASOUND/IVUS N/A 08/24/2020   Procedure: Intravascular Ultrasound/IVUS;  Surgeon:  [name], MD;  Location: [place] [facility];  Service: Cardiovascular;  Laterality: N/A;   IR IMAGING GUIDED PORT INSERTION  04/16/2022   IR US GUIDE BX ASP/DRAIN  04/07/2022   KNEE ARTHROPLASTY Right 10/31/2021   Procedure: COMPUTER ASSISTED TOTAL KNEE ARTHROPLASTY;  Surgeon: Rod Can, MD;  Location: [facility];  Service: Orthopedics;  Laterality: Right;   KNEE SURGERY Bilateral    numerous times   LEFT HEART CATH AND CORONARY ANGIOGRAPHY N/A 08/24/2020   Procedure: LEFT HEART CATH AND CORONARY ANGIOGRAPHY;  Surgeon: [name], MD;  Location: [place] [facility];  Service: Cardiovascular;  Laterality: N/A;   LEFT HEART CATH AND CORONARY ANGIOGRAPHY N/A 04/25/2021   Procedure: LEFT HEART CATH AND CORONARY ANGIOGRAPHY;  Surgeon: [name], MD;  Location: [place] [facility];  Service: Cardiovascular;  Laterality: N/A;   NECK SURGERY  70's   PACEMAKER IMPLANT N/A 12/04/2020   Procedure: PACEMAKER IMPLANT;  Surgeon: [name], MD;  Location: [place] [facility];  Service: Cardiovascular;  Laterality: N/A;   TOTAL HIP ARTHROPLASTY Left 03/09/2017   Procedure: LEFT TOTAL HIP ARTHROPLASTY ANTERIOR APPROACH;  Surgeon: Rod Can, MD;  Location: [facility];  Service: Orthopedics;  Laterality: Left;  Dr. requesting RNFA    FAMILY HISTORY:  Family History  Problem Relation Age of Onset   Dementia Mother  Heart Problems Father    COPD Father    Lung cancer Father     SOCIAL HISTORY:  Social History   Socioeconomic History   Marital status: Married    Spouse name: [place]   Number of children: 2   Years of education: Not on file   Highest education level: Not on file  Occupational History   Occupation: Animator  Tobacco Use   Smoking status: Former    Packs/day: 2.00    Years: 45.00    Pack years: 90.00    Types: Cigarettes    Quit date: 12/05/2016    Years since quitting: 5.3   Smokeless tobacco: Never  Vaping Use   Vaping Use: Never used   Substance and Sexual Activity   Alcohol use: No   Drug use: No   Sexual activity: Not on file  Other Topics Concern   Not on file  Social History Narrative   Not on file   Social Determinants of Health   Financial Resource Strain: Not on file  Food Insecurity: Not on file  Transportation Needs: Not on file  Physical Activity: Not on file  Stress: Not on file  Social Connections: Not on file  Intimate Partner Violence: Not on file    ALLERGIES: Bee venom, Cleocin [clindamycin hcl], and Statins  MEDICATIONS:  Current Facility-Administered Medications  Medication Dose Route Frequency Provider Last Rate Last Admin   allopurinol (ZYLOPRIM) tablet 100 mg  100 mg Oral BID Curt Bears, MD   100 mg at 04/17/22 1016   alum & mag hydroxide-simeth (MAALOX/MYLANTA) 200-200-20 MG/5ML suspension 15 mL  15 mL Oral Q6H PRN Charlynne Cousins, MD   15 mL at 04/17/22 1658   aspirin EC tablet 81 mg  81 mg Oral QPM Kyle, Tyrone A, DO   81 mg at 04/16/22 1923   Chlorhexidine Gluconate Cloth 2 % PADS 6 each  6 each Topical Daily Charlynne Cousins, MD       clopidogrel (PLAVIX) tablet 75 mg  75 mg Oral Daily Charlynne Cousins, MD   75 mg at 04/17/22 1015   docusate sodium (COLACE) capsule 100 mg  100 mg Oral BID Marylyn Ishihara, Tyrone A, DO   100 mg at 04/17/22 1016   enoxaparin (LOVENOX) injection 40 mg  40 mg Subcutaneous Q24H Charlynne Cousins, MD   40 mg at 04/17/22 1016   ezetimibe (ZETIA) tablet 10 mg  10 mg Oral q AM Marylyn Ishihara, Tyrone A, DO   10 mg at 04/17/22 1016   finasteride (PROSCAR) tablet 5 mg  5 mg Oral QHS Kyle, Tyrone A, DO   5 mg at 04/16/22 2140   gabapentin (NEURONTIN) capsule 200 mg  200 mg Oral QHS Kyle, Tyrone A, DO   200 mg at 04/16/22 2140   insulin aspart (novoLOG) injection 0-5 Units  0-5 Units Subcutaneous QHS Charlynne Cousins, MD       insulin aspart (novoLOG) injection 0-9 Units  0-9 Units Subcutaneous TID WC Charlynne Cousins, MD   1 Units at 04/17/22 1300    insulin aspart (novoLOG) injection 3 Units  3 Units Subcutaneous TID WC Charlynne Cousins, MD       levothyroxine (SYNTHROID) tablet 112 mcg  112 mcg Oral QAC breakfast Marylyn Ishihara, Tyrone A, DO   112 mcg at 04/17/22 0616   ondansetron (ZOFRAN) tablet 4 mg  4 mg Oral Q6H PRN Marylyn Ishihara, Tyrone A, DO       Or   ondansetron (ZOFRAN)  injection 4 mg  4 mg Intravenous Q6H PRN Marylyn Ishihara, Tyrone A, DO       pantoprazole (PROTONIX) EC tablet 40 mg  40 mg Oral QPC supper Marylyn Ishihara, Tyrone A, DO   40 mg at 04/16/22 1923   polyethylene glycol (MIRALAX / GLYCOLAX) packet 17 g  17 g Oral Daily PRN Marylyn Ishihara, Tyrone A, DO       prochlorperazine (COMPAZINE) injection 10 mg  10 mg Intravenous Q6H PRN Charlynne Cousins, MD       riTUXimab-pvvr (RUXIENCE) 800 mg in sodium chloride 0.9 % 250 mL (2.4242 mg/mL) infusion  375 mg/m2 (Treatment Plan Recorded) Intravenous Once Curt Bears, MD       rosuvastatin (CRESTOR) tablet 5 mg  5 mg Oral Daily Charlynne Cousins, MD   5 mg at 04/17/22 1015   sodium chloride 0.9 % bolus 500 mL  500 mL Intravenous Once Charlynne Cousins, MD 250 mL/hr at 04/17/22 1449 Rate Change at 04/17/22 1449   tamsulosin (FLOMAX) capsule 0.8 mg  0.8 mg Oral Q supper Cherylann Ratel A, DO   0.8 mg at 04/17/22 1658   [START ON 04/18/2022] Tbo-Filgrastim (GRANIX) injection 480 mcg  480 mcg Subcutaneous Daily Curcio, Roselie Awkward, NP       traMADol (ULTRAM) tablet 50 mg  50 mg Oral Q6H PRN Charlynne Cousins, MD   50 mg at 04/17/22 1018   umeclidinium bromide (INCRUSE ELLIPTA) 62.5 MCG/ACT 1 puff  1 puff Inhalation Daily Kyle, Tyrone A, DO   1 puff at 04/17/22 0743   vitamin B-12 (CYANOCOBALAMIN) tablet 1,000 mcg  1,000 mcg Oral q AM Marylyn Ishihara, Tyrone A, DO   1,000 mcg at 04/17/22 1015    REVIEW OF SYSTEMS:  On review of systems, the patient reports that he is doing fairly well overall. He reports some mild low back pain and stiffness since admission to the hospital but does not require pain medication and is not  progressively worsening.  He also reports persistent bilateral lower extremity weakness and generalized weakness that is not progressively worsening but is not significantly improved since admission either.  He denies any chest pain, shortness of breath, cough, fevers, chills, night sweats, or recent unintended weight changes.  He denies any bowel issues, abdominal pain, nausea or vomiting but has had persistent urinary retention since admission, requiring in and out catheterization despite Flomax/Proscar.  He has a history of BPH with BOO and has had issues with retention previously.  He denies any new musculoskeletal or joint aches or pains. A complete review of systems is obtained and is otherwise negative.    PHYSICAL EXAM:  Wt Readings from Last 3 Encounters:  03/23/2022 205 lb 0.4 oz (93 kg)  04/04/22 203 lb 0.7 oz (92.1 kg)  03/25/22 210 lb (95.3 kg)   Temp Readings from Last 3 Encounters:  04/17/22 98 F (36.7 C) (Oral)  04/14/2022 98 F (36.7 C) (Oral)  04/04/22 97.7 F (36.5 C)   BP Readings from Last 3 Encounters:  04/17/22 102/71  04/06/2022 92/67  04/04/22 109/66   Pulse Readings from Last 3 Encounters:  04/17/22 93  03/27/2022 71  04/04/22 66   Pain Assessment Pain Score: 5 /10  Unable to assess due to telephone consult visit format.  KPS = 60  100 - Normal; no complaints; no evidence of disease. 90   - Able to carry on normal activity; minor signs or symptoms of disease. 80   - Normal activity with effort; some signs  or symptoms of disease. 23   - Cares for self; unable to carry on normal activity or to do active work. 60   - Requires occasional assistance, but is able to care for most of his personal needs. 50   - Requires considerable assistance and frequent medical care. 2   - Disabled; requires special care and assistance. 28   - Severely disabled; hospital admission is indicated although death not imminent. 53   - Very sick; hospital admission necessary; active  supportive treatment necessary. 10   - Moribund; fatal processes progressing rapidly. 0     - Dead  Karnofsky DA, Abelmann Deweese, Craver LS and Burchenal [facility] [phone]) The use of the nitrogen mustards in the palliative treatment of carcinoma: with particular reference to bronchogenic carcinoma Cancer 1 634-56  LABORATORY DATA:  Lab Results  Component Value Date   WBC 4.2 04/16/2022   HGB 10.0 (L) 04/16/2022   HCT 30.4 (L) 04/16/2022   MCV 89.4 04/16/2022   PLT 134 (L) 04/16/2022   Lab Results  Component Value Date   NA 136 04/16/2022   K 5.3 (H) 04/16/2022   CL 101 04/16/2022   CO2 24 04/16/2022   Lab Results  Component Value Date   ALT 27 04/16/2022   AST 34 04/16/2022   ALKPHOS 102 04/16/2022   BILITOT 2.8 (H) 04/16/2022     RADIOGRAPHY: DG Thoracic Spine 2 View  Result Date: 03/25/2022 CLINICAL DATA:  Back pain EXAM: THORACIC SPINE 2 VIEWS COMPARISON:  None Available. FINDINGS: No recent fracture is seen. Alignment of posterior margins of vertebral bodies is unremarkable. There is fusion of bodies of C5 and C6 vertebrae. Severe degenerative changes are noted in the lower cervical spine at C6-C7 level. Small bony spurs seen in the lower thoracic spine. Paraspinal soft tissues are unremarkable. Pacemaker battery is seen in the left infraclavicular region. IMPRESSION: No recent fracture is seen. Degenerative changes are noted in the lower cervical spine and thoracic spine. Electronically Signed   By: Elmer Picker M.D.   On: 03/25/2022 20:23   DG Lumbar Spine 2-3 Views  Result Date: 03/25/2022 CLINICAL DATA:  Back pain EXAM: LUMBAR SPINE - 3 VIEW COMPARISON:  Radiographs and MRI done on 11/20/2021 FINDINGS: Last lumbar vertebra is transitional suggesting sacralization of L5 vertebra. No recent fracture is seen. Alignment of posterior margins of vertebral bodies is unremarkable. Degenerative changes are noted in lumbar spine with bony spurs and facet hypertrophy, more severe at L4-L5  level. There is previous left hip arthroplasty. There is 14 mm calcific density overlying the right kidney. IMPRESSION: No recent fracture is seen.  Degenerative changes are noted. There is 14 mm calcific density overlying the right kidney suggesting right renal stone. Electronically Signed   By: Elmer Picker M.D.   On: 03/25/2022 20:21   DG Abd 1 View  Result Date: 04/12/2022 CLINICAL DATA:  Nausea and vomiting. EXAM: ABDOMEN - 1 VIEW COMPARISON:  04/06/2022 FINDINGS: Bowel gas pattern appears nonobstructed. No dilated loops of small bowel identified. Right renal stone is again noted within the expected location of the inferior pole measuring 1.1 cm. Status post left hip arthroplasty. IMPRESSION: 1. Nonobstructive bowel gas pattern. 2. Right renal stone. Electronically Signed   By: Kerby Moors M.D.   On: 04/12/2022 11:23   DG Abd 1 View  Result Date: 04/06/2022 CLINICAL DATA:  Abdominal pain EXAM: ABDOMEN - 1 VIEW COMPARISON:  04/04/2022 FINDINGS: There remains residual contrast within the rectosigmoid. Stool is present  throughout the colon. No dilated loops. No acute osseous abnormality. Left hip arthroplasty. IMPRESSION: Persistent evidence of constipation. Electronically Signed   By: Macy Mis M.D.   On: 04/06/2022 13:02   CT HEAD WO CONTRAST (5MM)  Result Date: 04/09/2022 CLINICAL DATA:  Mental status change.  History of stroke. EXAM: CT HEAD WITHOUT CONTRAST TECHNIQUE: Contiguous axial images were obtained from the base of the skull through the vertex without intravenous contrast. RADIATION DOSE REDUCTION: This exam was performed according to the departmental dose-optimization program which includes automated exposure control, adjustment of the mA and/or kV according to patient size and/or use of iterative reconstruction technique. COMPARISON:  CT head 03/25/2022.  MRI head 03/27/2022 FINDINGS: Brain: Patchy hypodensities in the cerebral white matter bilaterally, left greater than  right are stable. No new area of infarct, hemorrhage, mass Vascular: Negative for hyperdense vessel Skull: Negative Sinuses/Orbits: Mild mucosal edema paranasal sinuses. Negative orbit Other: None IMPRESSION: No acute abnormality and no change from recent studies. Bilateral white matter hypodensities left greater than right are stable. Electronically Signed   By: Franchot Gallo M.D.   On: 04/09/2022 17:09   CT HEAD WO CONTRAST (5MM)  Result Date: 03/25/2022 CLINICAL DATA:  Neuro deficit, acute, stroke suspected worsening R leg weakness, hx of recurrent strokes EXAM: CT HEAD WITHOUT CONTRAST TECHNIQUE: Contiguous axial images were obtained from the base of the skull through the vertex without intravenous contrast. RADIATION DOSE REDUCTION: This exam was performed according to the departmental dose-optimization program which includes automated exposure control, adjustment of the mA and/or kV according to patient size and/or use of iterative reconstruction technique. COMPARISON:  MRI head 03/17/2022, CT 03/16/2022 FINDINGS: Brain: No evidence of acute intracranial hemorrhage or extra-axial collection.No evidence of mass lesion/concerning mass effect.The ventricles are normal in size.There is periventricular and subcortical white matter hypoattenuation, nonspecific and similar in distribution to recent MRI. Vascular: No hyperdense vessel. Skull: Negative for skull fracture. Sinuses/Orbits: Mild ethmoid air cell mucosal thickening. Other: None. IMPRESSION: No new acute findings on noncontrast CT. Similar distribution of white matter disease in comparison to recent MRI, nonspecific but suggestive of demyelinating process and/or microvascular ischemic disease. Electronically Signed   By: Maurine Simmering M.D.   On: 03/25/2022 15:54   MR BRAIN W WO CONTRAST  Result Date: 03/27/2022 CLINICAL DATA:  Acute neuro deficit. Rule out stroke or demyelinating disease. EXAM: MRI HEAD WITHOUT AND WITH CONTRAST TECHNIQUE:  Multiplanar, multiecho pulse sequences of the brain and surrounding structures were obtained without and with intravenous contrast. CONTRAST:  73m GADAVIST GADOBUTROL 1 MMOL/ML IV SOLN COMPARISON:  MRI head 03/17/2022, 11/20/2021 FINDINGS: Brain: Bilateral white matter hyperintensities are stable since most recent study but appear progressive since 11/20/2021. Lesions are most prominent in the left frontal and parietal white matter with several lesions perpendicular to the ventricular surface. Deep white matter lesions on the right extending to the right frontal horn also show mild progression since 11/20/2021. Lesion in the right brachium pontis unchanged. Many these lesions show increased signal on diffusion-weighted imaging compatible with T2 shine through. Involvement of the splenium unchanged. T1 shortening with T1 hyperintensity in the left occipital cortex on axial image 32 is unchanged the prior study. Likely an area of chronic hemorrhage. 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 This is not seen on the prior studies as contrast not administered. No compression of the optic chiasm. Vascular: Normal arterial flow voids Skull and upper cervical spine: Negative Sinuses/Orbits: Mucosal edema paranasal sinuses.  Negative orbit  Other: None IMPRESSION: White matter changes are similar in extent to the most recent MRI but progressive since 11/20/2021. Favor demyelinating disease over ischemia for most of these lesions 5 mm hypoenhancing nodule in the anterior pituitary. 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 Correlate with pituitary function tests. Electronically Signed   By: Franchot Gallo M.D.   On: 03/27/2022 16:01   MR CERVICAL SPINE W WO CONTRAST  Result Date: 03/27/2022 CLINICAL DATA:  Cervical myelopathy.  Possible CSF leak. EXAM: MRI CERVICAL SPINE WITHOUT AND WITH CONTRAST TECHNIQUE: Multiplanar and multiecho pulse sequences of the cervical spine, to include the craniocervical junction and  cervicothoracic junction, were obtained without and with intravenous contrast. CONTRAST:  84mL GADAVIST GADOBUTROL 1 MMOL/ML IV SOLN COMPARISON:  Radiographs 08/11/2018 FINDINGS: Alignment: Straightening of the normal cervical lordosis Elise in part due to a interbody fusion at C5-6. The vertebral bodies are otherwise normally aligned. Vertebrae: No bone lesions or fractures. Cord: Normal cervical cord signal intensity. No cord lesions or syrinx. At the very bottom of the sagittal images there appears to be some signal abnormality in the thoracic cord at the T3-4 level. This also could be artifact but a thoracic spine MRI may be helpful for further evaluation depending on the clinical situation. Posterior Fossa, vertebral arteries, paraspinal tissues: No significant findings. The visualized brainstem and cerebellum appear unremarkable. No Chiari malformation. Disc levels: C2-3: No significant findings. C3-4: Bilateral facet disease and mild uncinate spurring changes contributing to mild foraminal narrowing. C4-5: Bulging annulus, osteophytic ridging, uncinate spurring and facet disease. There is flattening of the ventral thecal sac and narrowing the ventral CSF space but no significant spinal stenosis. There is moderate left foraminal stenosis. No right-sided foraminal stenosis. C5-6: Interbody fusion changes. Mild posterior osteophytic ridging. No significant spinal or foraminal stenosis. C6-7: Degenerative disc disease with bulging annulus, osteophytic ridging, uncinate spurring and facet disease. This contributes to mild spinal and moderate left foraminal stenosis. C7-T1: No significant findings. IMPRESSION: 1. Interbody fusion changes at S9-6 without complicating features. No spinal or foraminal stenosis. 2. Mild spinal and moderate left foraminal stenosis at C4-5. 3. Mild spinal and moderate left foraminal stenosis at C6-7. 4. Possible signal abnormality in the thoracic cord at the very bottom of the sagittal  images at the T3-4 level. This could be artifact but a thoracic spine MRI may be helpful for further evaluation depending on the clinical situation. Electronically Signed   By: Marijo Sanes M.D.   On: 03/27/2022 16:03   MR THORACIC SPINE W WO CONTRAST  Result Date: 04/12/2022 CLINICAL DATA:  Spinal cord injury. Follow-up. Worsening weakness and numbness in lower extremities. Known cancer. Known spinal cord infarct. EXAM: MRI THORACIC WITHOUT AND WITH CONTRAST TECHNIQUE: Multiplanar and multiecho pulse sequences of the thoracic spine were obtained without and with intravenous contrast. This patient has a conditional pacemaker and was scanned according to safety guidelines. CONTRAST:  9.72mL GADAVIST GADOBUTROL 1 MMOL/ML IV SOLN COMPARISON:  CT chest, abdomen, and pelvis 03/29/2022; MRI thoracic spine 03/28/2022 FINDINGS: Alignment:  No sagittal spondylolisthesis. Vertebrae: Vertebral body heights are maintained. There is again abnormal decreased T1 increased T2/stir signal and diffuse enhancement seen throughout the majority of the T4 vertebral body. There also appears to be decreased T1 and increased T2/STIR signal with enhancement within the C7 and T1 vertebral bodies, similar to 03/28/2022 MRI. Small fat intensity hemangioma within the T8 vertebral body is unchanged. Cord: There is again abnormal increased T2/STIR signal within the T3-4 through T4-5 levels of the cord. This  may be slightly improved from prior. The cord now appears to be normal in caliber with resolution of the prior possible minimal enlargement. No definite cord enhancement is seen. The conus terminates at the superior L1 level. Paraspinal and other soft tissues: There is again thickening and nodularity within the bilateral adrenal glands. Partially visualized increased T2 signal lesion within the posterior right hepatic lobe likely corresponding to one of numerable lesion seen on 03/29/2022 CT.11 mm short axis lymph node is seen to the right  of the descending thoracic aorta (axial series 22, image 20). Given the diffuse lymphadenopathy on prior CT this also remains concerning for malignant involvement. Disc levels: C6-7: Partially visualized on sagittal images only. Mild-to-moderate posterior disc osteophyte complex. T3-4: Mild midline posterior disc protrusion with mild cephalad extension unchanged. No central canal or neuroforaminal stenosis. T4-5: Facet joint hypertrophy contributes to mild bilateral neuroforaminal stenosis. T5-6: Mild broad-based posterior disc bulge with mild left intraforaminal extension and mild-to-moderate left neuroforaminal stenosis, unchanged. T9-10: Minimal posterior disc bulge with mild bilateral intraforaminal extension. Mild bilateral facet joint hypertrophy. Mild bilateral neuroforaminal stenosis. T10-11: Facet joint hypertrophy contributes to mild right neuroforaminal stenosis. IMPRESSION: 1. There is again abnormal increased T2 signal within the central aspect of the cord fairly diffusely at the T3 and T4 levels. There appears to be resolution of the prior mild cord swelling/enlargement. Findings again may be secondary to cord ischemia or transverse myelitis. Again no definite enhancement is seen to suggest a neoplastic process. 2. There is again abnormal decreased T1 increased T2/STIR signal with enhancement within the C7, T1, and T4 vertebral bodies that is suspicious for metastatic disease. Note is made metastases were suggested within the liver, adrenals, and abdominal lymph nodes on 03/29/2022 CT. Electronically Signed   By: Yvonne Kendall M.D.   On: 04/12/2022 18:50   MR THORACIC SPINE W WO CONTRAST  Result Date: 03/28/2022 CLINICAL DATA:  Acute myelopathy. EXAM: MRI THORACIC AND LUMBAR SPINE WITHOUT AND WITH CONTRAST TECHNIQUE: Multiplanar and multiecho pulse sequences of the thoracic and lumbar spine were obtained without and with intravenous contrast. CONTRAST:  9.69m GADAVIST GADOBUTROL 1 MMOL/ML IV SOLN  COMPARISON:  MRI cervical spine without contrast. FINDINGS: MRI THORACIC SPINE FINDINGS Alignment:  Normal Vertebrae: Abnormal T1 and T2 signal intensity in the T4 vertebral body along with contrast enhancement. Possible T1 signal abnormality in the T1 vertebral body also. Could not exclude metastatic disease. Patient may metastatic workup. Cord: Cord signal abnormality in the upper thoracic cord extending from the T3-4 disc space to the bottom of T5. Cord is also slightly thickened in this area. I do not see any definite evidence of hemorrhage. No obvious enhancement to suggest a neoplastic process. This could be ischemic change or possibly transverse myelitis. The remainder of the thoracic cord is. Conus medullaris terminates at L1. Paraspinal and other soft tissues: No obvious lung mass or mediastinal/hilar mass or adenopathy. The aorta is grossly normal. In the upper abdomen there appear to be bilateral adrenal gland lesions and a very prominent caudate lobe of the liver which could suggest cirrhosis. Disc levels: No focal thoracic disc protrusions, spinal or foraminal stenosis. MRI LUMBAR SPINE FINDINGS Segmentation: There is partial sacralization of L5 with small disc space. Alignment:  Normal Vertebrae: Abnormal T1 signal intensity posterior aspect L3 and L4 and and most of the L5 and S1 vertebral bodies. Contrast enhancement is also demonstrated. Conus medullaris: Extends to the L1 level and appears normal. Paraspinal and other soft tissues: Borderline retroperitoneal lymph  nodes. Bilateral adrenal lesions. No aortic aneurysm. Disc levels: No significant disc protrusions, spinal or foraminal stenosis. There is a bulging degenerated annulus at L4-5 along with a shallow central disc protrusion. Moderate multilevel facet disease. IMPRESSION: 1. Abnormal cord signal intensity at the T3 and T4 levels. Findings suggest cord ischemia or other process such as transverse myelitis. No enhancement to suggest neoplastic  process. 2. There are several areas of abnormal T1 marrow signal and contrast enhancement worrisome for metastatic disease or lymphoma. Patient may need metastatic workup. There are also bilateral adrenal gland lesions and borderline retroperitoneal lymph nodes. 3. Electronically Signed   By: Marijo Sanes M.D.   On: 03/28/2022 18:13   MR Lumbar Spine W Wo Contrast  Result Date: 04/12/2022 CLINICAL DATA:  Spinal cord injury. Follow-up. Worsening numbness and weakness. EXAM: MRI LUMBAR SPINE WITHOUT AND WITH CONTRAST TECHNIQUE: Multiplanar and multiecho pulse sequences of the lumbar spine were obtained without and with intravenous contrast. Patient has conditional pacemaker and was scanned according to safety guidelines. CONTRAST:  9.67m GADAVIST GADOBUTROL 1 MMOL/ML IV SOLN COMPARISON:  MRI lumbar spine 03/28/2022; CT chest, abdomen, and pelvis 03/29/2022 FINDINGS: Segmentation: There is again partial sacralization of L5. Rudimentary disc at L5-S1. Alignment:  Trace retrolisthesis of L4 on L5, unchanged. Vertebrae: Vertebral body heights are maintained. Mild superior L2 endplate degenerative Schmorl's node. There is again decreased T1 signal and subtle increased T2/stir signal and possible enhancement seen within the posterior L3 and L4 vertebral bodies and more diffusely within the L5 and S1 vertebral bodies. Conus medullaris and cauda equina: Conus extends to the superior L1 level. Conus and cauda equina appear normal. Paraspinal and other soft tissues: There again borderline enlarged para-aortic retroperitoneal lymph nodes. A right lower pole renal 11 mm likely cyst with layering calcification is seen, as on prior CT. Borderline infrarenal abdominal aortic aneurysm. Disc levels: L1-2: Mild bilateral facet joint hypertrophy. Mild broad-based posterior disc osteophyte complex with mild bilateral intraforaminal extension. Mild-to-moderate bilateral neuroforaminal stenosis. L2-3: Mild bilateral facet joint  hypertrophy. Mild-to-moderate broad-based posterior disc osteophyte complex. Mild-to-moderate bilateral neuroforaminal stenosis. L3-4: Mild bilateral facet joint hypertrophy. Mild to moderate broad-based posterior disc osteophyte complex. Moderate left and mild right neuroforaminal stenosis. Mild narrowing of the lateral recesses and mild central canal stenosis. L4-5: Mild-to-moderate bilateral facet joint hypertrophy. Posterior disc bulge with midline posterior disc protrusion. No significant central canal stenosis. No significant neuroforaminal stenosis. L5-S1: Rudimentary disc. No posterior disc bulge, central canal narrowing, or neuroforaminal stenosis. IMPRESSION: 1. No significant change from 03/28/2022. 2. Probable bone marrow signal abnormality within the L3 through S1 vertebral bodies, concerning for metastatic disease. 3. Multilevel degenerative disc and joint changes as above. Electronically Signed   By: RYvonne KendallM.D.   On: 04/12/2022 19:03   MR Lumbar Spine W Wo Contrast  Result Date: 03/28/2022 CLINICAL DATA:  Acute myelopathy. EXAM: MRI THORACIC AND LUMBAR SPINE WITHOUT AND WITH CONTRAST TECHNIQUE: Multiplanar and multiecho pulse sequences of the thoracic and lumbar spine were obtained without and with intravenous contrast. CONTRAST:  9.523mGADAVIST GADOBUTROL 1 MMOL/ML IV SOLN COMPARISON:  MRI cervical spine without contrast. FINDINGS: MRI THORACIC SPINE FINDINGS Alignment:  Normal Vertebrae: Abnormal T1 and T2 signal intensity in the T4 vertebral body along with contrast enhancement. Possible T1 signal abnormality in the T1 vertebral body also. Could not exclude metastatic disease. Patient may metastatic workup. Cord: Cord signal abnormality in the upper thoracic cord extending from the T3-4 disc space to the bottom of T5. Cord  is also slightly thickened in this area. I do not see any definite evidence of hemorrhage. No obvious enhancement to suggest a neoplastic process. This could be  ischemic change or possibly transverse myelitis. The remainder of the thoracic cord is. Conus medullaris terminates at L1. Paraspinal and other soft tissues: No obvious lung mass or mediastinal/hilar mass or adenopathy. The aorta is grossly normal. In the upper abdomen there appear to be bilateral adrenal gland lesions and a very prominent caudate lobe of the liver which could suggest cirrhosis. Disc levels: No focal thoracic disc protrusions, spinal or foraminal stenosis. MRI LUMBAR SPINE FINDINGS Segmentation: There is partial sacralization of L5 with small disc space. Alignment:  Normal Vertebrae: Abnormal T1 signal intensity posterior aspect L3 and L4 and and most of the L5 and S1 vertebral bodies. Contrast enhancement is also demonstrated. Conus medullaris: Extends to the L1 level and appears normal. Paraspinal and other soft tissues: Borderline retroperitoneal lymph nodes. Bilateral adrenal lesions. No aortic aneurysm. Disc levels: No significant disc protrusions, spinal or foraminal stenosis. There is a bulging degenerated annulus at L4-5 along with a shallow central disc protrusion. Moderate multilevel facet disease. IMPRESSION: 1. Abnormal cord signal intensity at the T3 and T4 levels. Findings suggest cord ischemia or other process such as transverse myelitis. No enhancement to suggest neoplastic process. 2. There are several areas of abnormal T1 marrow signal and contrast enhancement worrisome for metastatic disease or lymphoma. Patient may need metastatic workup. There are also bilateral adrenal gland lesions and borderline retroperitoneal lymph nodes. 3. Electronically Signed   By: Marijo Sanes M.D.   On: 03/28/2022 18:13   CT CHEST ABDOMEN PELVIS W CONTRAST  Result Date: 03/29/2022 CLINICAL DATA:  Possible metastatic disease within the thoracolumbar spine EXAM: CT CHEST, ABDOMEN, AND PELVIS WITH CONTRAST TECHNIQUE: Multidetector CT imaging of the chest, abdomen and pelvis was performed following  the standard protocol during bolus administration of intravenous contrast. RADIATION DOSE REDUCTION: This exam was performed according to the departmental dose-optimization program which includes automated exposure control, adjustment of the mA and/or kV according to patient size and/or use of iterative reconstruction technique. CONTRAST:  168m OMNIPAQUE IOHEXOL 300 MG/ML  SOLN COMPARISON:  03/27/2022, 03/28/2022, 10/08/2021 FINDINGS: CT CHEST FINDINGS Cardiovascular: The heart and great vessels are unremarkable without pericardial effusion. Dual lead pacer unchanged. No evidence of thoracic aortic aneurysm or dissection. Stable atherosclerosis of the aorta and coronary vasculature. Mediastinum/Nodes: No enlarged mediastinal, hilar, or axillary lymph nodes. Thyroid gland, trachea, and esophagus demonstrate no significant findings. Lungs/Pleura: Bilateral partially calcified less than 4 mm pulmonary nodules are unchanged since prior study consistent with granulomas. No new pulmonary nodules or masses. No acute airspace disease, effusion, or pneumothorax. Central airways are patent. Musculoskeletal: There are no acute displaced fractures. The abnormality of the T4 vertebral body seen on prior thoracic spine MRI is not apparent by CT. No destructive bony lesions. Reconstructed images demonstrate no additional findings. CT ABDOMEN PELVIS FINDINGS Hepatobiliary: There are multiple hypodense lesions seen throughout the liver, consistent with metastatic disease. Index lesion right lobe image 50/3 measures 2.0 x 1.6 cm. No biliary duct dilation. Small gallstone within the gallbladder neck without evidence of acute cholecystitis. Pancreas: Unremarkable. No pancreatic ductal dilatation or surrounding inflammatory changes. Spleen: Spleen is enlarged measuring 15.7 by 15.6 x 5.8 cm. The splenic parenchyma is heterogeneous, with multiple hypodensities measuring up to 1.3 cm reference image 62. Adrenals/Urinary Tract: Bilateral  adrenal thickening, measuring up to 1.3 cm on the right and 1.5 cm  on the left, suspicious for metastatic disease given the additional findings. The kidneys enhance normally and symmetrically. Nonobstructing 13 mm calculus mid right kidney. No obstructive uropathy within either kidney. Bladder is only minimally distended, with nonspecific bladder wall thickening. Foley catheter is identified, with small amount of gas in the bladder lumen consistent with catheterization. Stomach/Bowel: No bowel obstruction or ileus. Normal appendix right lower quadrant. No bowel wall thickening or inflammatory change. Vascular/Lymphatic: Multiple enlarged lymph nodes within the upper abdomen and retroperitoneum. 14 mm short axis lymph node within the gastrohepatic ligament image 57/3. Retroperitoneal adenopathy, with index lymph node measuring 13 mm in short axis in the aortocaval region image 80/3. 3.3 cm infrarenal abdominal aortic aneurysm. Moderate atherosclerosis throughout the aorta and its branches. Reproductive: Prostate is unremarkable. Other: No free fluid or free intraperitoneal gas. No abdominal wall hernia. Musculoskeletal: Unremarkable left hip arthroplasty. 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 There are no acute displaced fractures. The abnormalities of the L3 through S1 vertebral body seen on prior MRI are not readily apparent by CT. No destructive bony lesions are identified. Reconstructed images demonstrate no additional findings. IMPRESSION: 1. The suspected metastatic disease within the T4 vertebral body and L3 through S1 vertebral bodies on prior MRI is occult by CT. No destructive bony lesions. 2. Numerous hypodensities throughout the liver consistent with metastatic disease. 3. Splenomegaly, with indeterminate hypodensities consistent with metastases. 4. Bilateral adrenal thickening, suspicious for adrenal metastases. 5. Diffuse lymphadenopathy within the upper  abdomen and retroperitoneum, consistent with metastatic disease or lymphoma. 6. 3.3 cm infrarenal abdominal aortic aneurysm. Recommend follow-up every 3 years. Reference: J Am Coll Radiol 2505;39:767-341. 7. No acute intrathoracic process. Bilateral sub 4 mm pulmonary nodules are partially calcified, consistent with benign nodules based on stability and imaging characteristics. 8. 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 Electronically Signed   By: Randa Ngo M.D.   On: 03/29/2022 19:46   IR US Guide Bx Asp/Drain  Result Date: 04/07/2022 INDICATION: Multiple hepatic lesions EXAM: Ultrasound-guided biopsy of left liver mass MEDICATIONS: None. ANESTHESIA/SEDATION: Moderate (conscious) sedation was employed during this procedure. A total of Versed 1 mg and Fentanyl 50 mcg was administered intravenously by the radiology nurse. Total intra-service moderate Sedation Time: 10 minutes. The patient's level of consciousness and vital signs were monitored continuously by radiology nursing throughout the procedure under my direct supervision. COMPLICATIONS: None immediate. PROCEDURE: Informed written consent was obtained from the patient after a thorough discussion of the procedural risks, benefits and alternatives. All questions were addressed. Maximal Sterile Barrier Technique was utilized including caps, mask, sterile gowns, sterile gloves, sterile drape, hand hygiene and skin antiseptic. A timeout was performed prior to the initiation of the procedure. Patient position supine on the ultrasound table. Epigastric skin prepped and draped in usual sterile fashion. Following local lidocaine administration, 17 gauge introducer needle was advanced into 1 of the left hepatic lobe lesions, and 4- 18 gauge cores were obtained utilizing continuous ultrasound guidance. Gelfoam slurry was administered through the introducer needle at the biopsy site. Samples were sent to pathology in formalin. Needle  removed and hemostasis achieved with 5 minutes of manual compression. Post procedure ultrasound images showed no evidence of significant hemorrhage. IMPRESSION: Ultrasound-guided biopsy of left liver mass as above. Electronically Signed   By: Miachel Roux M.D.   On: 04/07/2022 16:20   DG CHEST PORT 1 VIEW  Result Date: 03/25/2022 CLINICAL DATA:  Right-sided weakness EXAM: PORTABLE CHEST 1 VIEW  COMPARISON:  11/20/2021 FINDINGS: Lungs are well expanded, symmetric, and clear. No pneumothorax or pleural effusion. Cardiac size within normal limits. 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 Osseous structures are age-appropriate. No acute bone abnormality. IMPRESSION: No active disease. Electronically Signed   By: Fidela Salisbury M.D.   On: 03/25/2022 19:26   DG Knee Right Port  Result Date: 03/25/2022 CLINICAL DATA:  Fall, history of knee replacement EXAM: PORTABLE RIGHT KNEE - 1-2 VIEW COMPARISON:  Knee radiographs 10/31/2021 FINDINGS: Postsurgical changes reflecting right knee arthroplasty are again seen. There is no evidence of acute fracture or dislocation. There is no evidence of hardware related complication. There is a small suprapatellar effusion. IMPRESSION: Acute fracture or dislocation.  Small effusion. Electronically Signed   By: Valetta Mole M.D.   On: 03/25/2022 16:16   DG Abd Portable 1V  Result Date: 04/04/2022 CLINICAL DATA:  Constipation, initial encounter EXAM: PORTABLE ABDOMEN - 1 VIEW COMPARISON:  12/30/2021 FINDINGS: Previously administered contrast now lies within the left colon. Retained fecal material is seen without obstructive change. No free air is noted. No acute bony abnormality is noted. IMPRESSION: Changes consistent with colonic constipation. Previously administered contrast remains within the colon 6 days later. Electronically Signed   By: Inez Catalina M.D.   On: 04/04/2022 19:04   ECHOCARDIOGRAM COMPLETE  Result Date:  04/16/2022    ECHOCARDIOGRAM REPORT   Patient Name:   SYDNEY AZURE Date of Exam: 04/14/2022 Medical Rec #:  [id number]      Height:       69.0 in Accession #:    [id number]     Weight:       203.0 lb Date of Birth:  [birth year]      BSA:          2.079 m Patient Age:    61 years       BP:           112/78 mmHg Patient Gender: M              HR:           66 bpm. Exam Location:  Inpatient Procedure: 2D Echo, Cardiac Doppler, Color Doppler and Strain Analysis Indications:    Chemo  History:        Patient has prior history of Echocardiogram examinations, most                 recent 11/20/2021. COPD; Risk Factors:Hypertension and                 Dyslipidemia. 04/25/21 cath                 12/04/20 pacemaker.  Sonographer:    Luisa Hart RDCS Referring Phys: [place]  1. Left ventricular ejection fraction, by estimation, is 55 to 60%. The left ventricle has normal function. The left ventricle has no regional wall motion abnormalities. Left ventricular diastolic parameters were normal.  2. Right ventricular systolic function is normal. The right ventricular size is normal.  3. The mitral valve is normal in structure. Trivial mitral valve regurgitation.  4. The aortic valve is tricuspid. Aortic valve regurgitation is trivial. Aortic valve sclerosis is present, with no evidence of aortic valve stenosis. Comparison(s): The left ventricular function is unchanged. FINDINGS  Left Ventricle: Left ventricular ejection fraction, by estimation, is 55 to 60%. The left ventricle has normal function. The left ventricle has no regional wall motion abnormalities.  The left ventricular internal cavity size was normal in size. There is  no left ventricular hypertrophy. Left ventricular diastolic parameters were normal. Right Ventricle: The right ventricular size is normal. Right vetricular wall thickness was not assessed. Right ventricular systolic function is normal. Left Atrium: Left atrial size was normal in size.  Right Atrium: Right atrial size was normal in size. Pericardium: There is no evidence of pericardial effusion. Mitral Valve: The mitral valve is normal in structure. Trivial mitral valve regurgitation. MV peak gradient, 2.2 mmHg. The mean mitral valve gradient is 1.0 mmHg. Tricuspid Valve: The tricuspid valve is normal in structure. Tricuspid valve regurgitation is trivial. Aortic Valve: The aortic valve is tricuspid. Aortic valve regurgitation is trivial. Aortic regurgitation PHT measures 636 msec. Aortic valve sclerosis is present, with no evidence of aortic valve stenosis. Aortic valve mean gradient measures 3.3 mmHg. Aortic valve peak gradient measures 6.7 mmHg. Aortic valve area, by VTI measures 3.28 cm. Pulmonic Valve: The pulmonic valve was grossly normal. Pulmonic valve regurgitation is not visualized. Aorta: The aortic root and ascending aorta are structurally normal, with no evidence of dilitation. IAS/Shunts: No atrial level shunt detected by color flow Doppler.  LEFT VENTRICLE PLAX 2D LVIDd:         5.10 cm     Diastology LVIDs:         3.40 cm     LV e' medial:    7.07 cm/s LV PW:         1.00 cm     LV E/e' medial:  7.4 LV IVS:        1.00 cm     LV e' lateral:   14.00 cm/s LVOT diam:     2.20 cm     LV E/e' lateral: 3.7 LV SV:         79 LV SV Index:   38 LVOT Area:     3.80 cm  LV Volumes (MOD) LV vol d, MOD A2C: 42.3 ml LV vol d, MOD A4C: 99.0 ml LV vol s, MOD A2C: 18.9 ml LV vol s, MOD A4C: 39.8 ml LV SV MOD A2C:     23.4 ml LV SV MOD A4C:     99.0 ml LV SV MOD BP:      36.8 ml RIGHT VENTRICLE RV Basal diam:  3.00 cm RV Mid diam:    3.00 cm RV S prime:     13.60 cm/s TAPSE (M-mode): 1.3 cm LEFT ATRIUM           Index        RIGHT ATRIUM           Index LA diam:      3.40 cm 1.64 cm/m   RA Area:     13.60 cm LA Vol (A4C): 31.1 ml 14.96 ml/m  RA Volume:   33.00 ml  15.87 ml/m  AORTIC VALVE                    PULMONIC VALVE AV Area (Vmax):    3.27 cm     PV Vmax:       1.38 m/s AV Area (Vmean):    3.04 cm     PV Vmean:      87.300 cm/s AV Area (VTI):     3.28 cm     PV VTI:        0.258 m AV Vmax:           129.00 cm/s  PV  Peak grad:  7.7 mmHg AV Vmean:          84.400 cm/s  PV Mean grad:  3.5 mmHg AV VTI:            0.241 m AV Peak Grad:      6.7 mmHg AV Mean Grad:      3.3 mmHg LVOT Vmax:         111.00 cm/s LVOT Vmean:        67.450 cm/s LVOT VTI:          0.208 m LVOT/AV VTI ratio: 0.86 AI PHT:            636 msec  AORTA Ao Root diam: 3.50 cm Ao Asc diam:  2.80 cm MITRAL VALVE               TRICUSPID VALVE MV Area (PHT): 3.03 cm    TR Peak grad:   25.0 mmHg MV Area VTI:   3.44 cm    TR Vmax:        250.00 cm/s MV Peak grad:  2.2 mmHg MV Mean grad:  1.0 mmHg    SHUNTS MV Vmax:       0.75 m/s    Systemic VTI:  0.21 m MV Vmean:      45.9 cm/s   Systemic Diam: 2.20 cm MV Decel Time: 250 msec MV E velocity: 52.30 cm/s MV A velocity: 73.30 cm/s MV E/A ratio:  0.71 Dorris Carnes MD Electronically signed by Dorris Carnes MD Signature Date/Time: 03/30/2022/12:11:06 PM    Final    IR IMAGING GUIDED PORT INSERTION  Result Date: 04/17/2022 CLINICAL DATA:  Lymphoma, 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 TECHNIQUE: The procedure, risks, benefits, and alternatives were explained to the patient. Questions regarding the procedure were encouraged and answered. The patient understands and consents to the procedure. Patency of the right IJ vein was confirmed with ultrasound with image documentation. An appropriate skin site was determined. Skin  site was marked. Region was prepped using maximum barrier technique including cap and mask, sterile gown, sterile gloves, large sterile sheet, and Chlorhexidine as cutaneous antisepsis. The region was infiltrated locally with 1% lidocaine. Under real-time ultrasound guidance, the right IJ vein was accessed with a 21 gauge micropuncture needle; the needle tip within the vein was confirmed with ultrasound image documentation. Needle was exchanged over a 018 guidewire for transitional dilator, and vascular measurement was performed. A small incision was made on the right anterior chest wall and a subcutaneous pocket fashioned. The power-injectable port was positioned and its catheter tunneled to the right IJ dermatotomy site. The transitional dilator was exchanged over an Amplatz wire for a peel-away sheath, through which the port catheter, which had been trimmed to the appropriate length, was advanced and positioned under fluoroscopy with its tip at the cavoatrial junction. Spot chest radiograph confirms good catheter position and no pneumothorax. The port was flushed per protocol. The pocket was closed with deep interrupted and subcuticular continuous 3-0 Monocryl sutures. The incisions were covered with Dermabond then covered with a sterile dressing. The patient tolerated the procedure well. COMPLICATIONS: COMPLICATIONS  None immediate IMPRESSION: Technically successful right IJ power-injectable port catheter placement. Ready for routine use. Electronically Signed   By: Lucrezia Europe M.D.   On: 04/17/2022 08:00   VAS [place] LOWER EXTREMITY VENOUS (DVT) (ONLY [facility] & [facility])  Result Date: 03/25/2022  Lower Venous DVT Study Patient Name:  DONTEA CORLEW  Date of Exam:   03/25/2022 Medical Rec #: [id number]       Accession #:    [id number] Date of Birth: [birth year]       Patient Gender: M Patient Age:   63 years Exam Location:  [facility] Procedure:      VAS [place] LOWER EXTREMITY VENOUS (DVT) Referring Phys: DAVID YAO  --------------------------------------------------------------------------------  Indications: Right leg weakness and pain.  Comparison Study: 11-21-2021 Prior bilateral lower extremity venous was negative                   for DVT. Performing Technologist: Darlin Coco RDMS, RVT  Examination Guidelines: A complete evaluation includes B-mode imaging, spectral Doppler, color Doppler, and power Doppler as needed of all accessible portions of each vessel. Bilateral testing is considered an integral part of a complete examination. Limited examinations for reoccurring indications may be performed as noted. The reflux portion of the exam is performed with the patient in reverse Trendelenburg.  +---------+---------------+---------+-----------+----------+--------------+ RIGHT    CompressibilityPhasicitySpontaneityPropertiesThrombus Aging +---------+---------------+---------+-----------+----------+--------------+ CFV      Full           Yes      Yes                                 +---------+---------------+---------+-----------+----------+--------------+ SFJ      Full                                                        +---------+---------------+---------+-----------+----------+--------------+ FV Prox  Full                                                        +---------+---------------+---------+-----------+----------+--------------+ FV Mid   Full                                                        +---------+---------------+---------+-----------+----------+--------------+ FV DistalFull                                                        +---------+---------------+---------+-----------+----------+--------------+ PFV      Full                                                        +---------+---------------+---------+-----------+----------+--------------+ POP  Full           Yes      Yes                                  +---------+---------------+---------+-----------+----------+--------------+ PTV      Full                                                        +---------+---------------+---------+-----------+----------+--------------+ PERO     Full                                                        +---------+---------------+---------+-----------+----------+--------------+ Gastroc  Full                                                        +---------+---------------+---------+-----------+----------+--------------+   +----+---------------+---------+-----------+----------+--------------+ LEFTCompressibilityPhasicitySpontaneityPropertiesThrombus Aging +----+---------------+---------+-----------+----------+--------------+ CFV Full           Yes      Yes                                 +----+---------------+---------+-----------+----------+--------------+     Summary: RIGHT: - There is no evidence of deep vein thrombosis in the lower extremity.  - Appearance of mild effusion of right knee, also visualized on x-ray.  LEFT: - No evidence of common femoral vein obstruction.  *See table(s) above for measurements and observations. Electronically signed by Deitra Mayo MD on 03/25/2022 at 6:54:00 PM.    Final       IMPRESSION/PLAN: 44. 66 y.o. male recently diagnosed with diffuse large B cell non-Hodgkin lymphoma involving abdominal lymphadenopathy in addition to liver metastasis and suspicious bone metastasis. Today, I talked to the patient and family about the findings and workup thus far. We discussed the natural history of diffuse B-cell lymphoma and general treatment, highlighting the role of radiotherapy in the management of painful bony disease.  While there is some suspicion for metastatic disease in the lumbar spine, there is no evidence of cord compression and the patient is not currently having any significant pain.  After reviewing his case and imaging, Dr. Lisbeth Renshaw agrees that the  lower extremity weakness is felt most likely associated with the spinal cord infarct and less likely related to the potential small amount of disease in the spine.  However, if this is related to the lymphoma, fortunately, these typically respond very well to systemic chemotherapy and he has just started R-CHOP today so the recommendation is to continue with systemic chemotherapy and monitor his symptoms.  If his symptoms improve with systemic therapy and continued PT/OT, he may not need any radiotherapy but I did advise that we would be more than happy to continue participating in his care should his back pain or weakness worsen instead of improve.  The patient and his family were encouraged to ask questions that were answered to their stated satisfaction  and they are comfortable and in agreement with the stated plan.  They have our contact information and will let us know if they have any further questions or concerns.  Therefore, we will sign off for now but continue to follow his progress via correspondence with medical oncology.   I personally spent 60 minutes in this encounter including chart review, reviewing radiological studies, telephone conversation with the patient and his family, coordinating care and completing documentation.    Nicholos Johns, PA-C   Jodelle Gross, MD, PhD   [facility]  Radiation Oncology Direct Dial: [phone]  Fax: [phone] [email or web address]  Skype  [email or web address]

## 2022-04-17 DEATH — deceased

## 2022-04-18 DIAGNOSIS — D352 Benign neoplasm of pituitary gland: Secondary | ICD-10-CM

## 2022-04-18 DIAGNOSIS — C8338 Diffuse large B-cell lymphoma, lymph nodes of multiple sites: Secondary | ICD-10-CM

## 2022-04-18 LAB — CBC WITH DIFFERENTIAL/PLATELET
Abs Immature Granulocytes: 0.06 10*3/uL (ref 0.00–0.07)
Basophils Absolute: 0 10*3/uL (ref 0.0–0.1)
Basophils Relative: 0 %
Eosinophils Absolute: 0 10*3/uL (ref 0.0–0.5)
Eosinophils Relative: 0 %
HCT: 30.6 % — ABNORMAL LOW (ref 39.0–52.0)
Hemoglobin: 9.9 g/dL — ABNORMAL LOW (ref 13.0–17.0)
Immature Granulocytes: 1 %
Lymphocytes Relative: 6 %
Lymphs Abs: 0.3 10*3/uL — ABNORMAL LOW (ref 0.7–4.0)
MCH: 28.3 pg (ref 26.0–34.0)
MCHC: 32.4 g/dL (ref 30.0–36.0)
MCV: 87.4 fL (ref 80.0–100.0)
Monocytes Absolute: 0.4 10*3/uL (ref 0.1–1.0)
Monocytes Relative: 9 %
Neutro Abs: 3.6 10*3/uL (ref 1.7–7.7)
Neutrophils Relative %: 84 %
Platelets: 106 10*3/uL — ABNORMAL LOW (ref 150–400)
RBC: 3.5 MIL/uL — ABNORMAL LOW (ref 4.22–5.81)
RDW: 18.9 % — ABNORMAL HIGH (ref 11.5–15.5)
WBC: 4.4 10*3/uL (ref 4.0–10.5)
nRBC: 0 % (ref 0.0–0.2)

## 2022-04-18 LAB — COMPREHENSIVE METABOLIC PANEL
ALT: 30 U/L (ref 0–44)
AST: 52 U/L — ABNORMAL HIGH (ref 15–41)
Albumin: 1.7 g/dL — ABNORMAL LOW (ref 3.5–5.0)
Alkaline Phosphatase: 99 U/L (ref 38–126)
Anion gap: 9 (ref 5–15)
BUN: 62 mg/dL — ABNORMAL HIGH (ref 8–23)
CO2: 25 mmol/L (ref 22–32)
Calcium: 9 mg/dL (ref 8.9–10.3)
Chloride: 99 mmol/L (ref 98–111)
Creatinine, Ser: 1.11 mg/dL (ref 0.61–1.24)
GFR, Estimated: >60 mL/min (ref >60)
Glucose, Bld: 224 mg/dL — ABNORMAL HIGH (ref 70–99)
Potassium: 5.5 mmol/L — ABNORMAL HIGH (ref 3.5–5.1)
Sodium: 133 mmol/L — ABNORMAL LOW (ref 135–145)
Total Bilirubin: 5.2 mg/dL — ABNORMAL HIGH (ref 0.3–1.2)
Total Protein: 5.2 g/dL — ABNORMAL LOW (ref 6.5–8.1)

## 2022-04-18 LAB — PHOSPHORUS: Phosphorus: 5.2 mg/dL — ABNORMAL HIGH (ref 2.5–4.6)

## 2022-04-18 LAB — LACTATE DEHYDROGENASE: LDH: 1036 U/L — ABNORMAL HIGH (ref 98–192)

## 2022-04-18 LAB — GLUCOSE, CAPILLARY
Glucose-Capillary: 222 mg/dL — ABNORMAL HIGH (ref 70–99)
Glucose-Capillary: 288 mg/dL — ABNORMAL HIGH (ref 70–99)
Glucose-Capillary: 230 mg/dL — ABNORMAL HIGH (ref 70–99)
Glucose-Capillary: 246 mg/dL — ABNORMAL HIGH (ref 70–99)

## 2022-04-18 LAB — URIC ACID: Uric Acid, Serum: 7.1 mg/dL (ref 3.7–8.6)

## 2022-04-18 MED ORDER — SODIUM CHLORIDE 0.9 % IV SOLN
375.0000 mg/m2 | Freq: Once | INTRAVENOUS | Status: AC
  Administered 2022-04-18: 800 mg via INTRAVENOUS
  Filled 2022-04-18: qty 80

## 2022-04-18 MED ORDER — POLYETHYLENE GLYCOL 3350 17 G PO PACK
17.0000 g | PACK | Freq: Every day | ORAL | Status: DC
  Administered 2022-04-18 – 2022-04-24 (×7): 17 g via ORAL
  Filled 2022-04-18 (×7): qty 1

## 2022-04-18 MED ORDER — SODIUM CHLORIDE 0.9 % IV SOLN: INTRAVENOUS | Status: DC

## 2022-04-18 MED ORDER — METHYLPREDNISOLONE SODIUM SUCC 125 MG IJ SOLR
80.0000 mg | Freq: Once | INTRAMUSCULAR | Status: AC
  Administered 2022-04-18: 80 mg via INTRAVENOUS
  Filled 2022-04-18: qty 2

## 2022-04-18 MED ORDER — DOCUSATE SODIUM 283 MG RE ENEM
1.0000 | ENEMA | Freq: Every day | RECTAL | Status: DC
  Administered 2022-04-18 – 2022-04-22 (×5): 283 mg via RECTAL
  Filled 2022-04-18 (×6): qty 1

## 2022-04-18 MED ORDER — DIPHENHYDRAMINE HCL 50 MG/ML IJ SOLN
25.0000 mg | Freq: Once | INTRAMUSCULAR | Status: AC
  Administered 2022-04-18: 25 mg via INTRAVENOUS
  Filled 2022-04-18: qty 1

## 2022-04-18 MED ORDER — MEPERIDINE HCL 25 MG/ML IJ SOLN
12.5000 mg | Freq: Once | INTRAMUSCULAR | Status: AC | PRN
  Administered 2022-04-19: 12.5 mg via INTRAVENOUS
  Filled 2022-04-18: qty 1

## 2022-04-18 MED ORDER — ACETAMINOPHEN 325 MG PO TABS
650.0000 mg | ORAL_TABLET | Freq: Once | ORAL | Status: AC
  Administered 2022-04-18: 650 mg via ORAL
  Filled 2022-04-18: qty 2

## 2022-04-18 MED ORDER — ADULT MULTIVITAMIN W/MINERALS CH
1.0000 | ORAL_TABLET | Freq: Every day | ORAL | Status: DC
  Administered 2022-04-18 – 2022-04-24 (×7): 1 via ORAL
  Filled 2022-04-18 (×7): qty 1

## 2022-04-18 MED ORDER — FAMOTIDINE IN NACL 20-0.9 MG/50ML-% IV SOLN
20.0000 mg | Freq: Once | INTRAVENOUS | Status: AC
  Administered 2022-04-18: 20 mg via INTRAVENOUS
  Filled 2022-04-18: qty 50

## 2022-04-18 MED ORDER — PREDNISONE 20 MG PO TABS
60.0000 mg | ORAL_TABLET | Freq: Every day | ORAL | Status: AC
  Administered 2022-04-19 – 2022-04-21 (×3): 60 mg via ORAL
  Filled 2022-04-18 (×3): qty 3

## 2022-04-18 MED ORDER — HYDROCODONE-ACETAMINOPHEN 5-325 MG PO TABS
1.0000 | ORAL_TABLET | ORAL | Status: DC | PRN
  Administered 2022-04-18: 2 via ORAL
  Administered 2022-04-18: 1 via ORAL
  Administered 2022-04-19: 2 via ORAL
  Administered 2022-04-20: 1 via ORAL
  Administered 2022-04-20 – 2022-04-24 (×11): 2 via ORAL
  Filled 2022-04-18 (×4): qty 2
  Filled 2022-04-18: qty 1
  Filled 2022-04-18 (×11): qty 2

## 2022-04-18 MED ORDER — BOOST / RESOURCE BREEZE PO LIQD CUSTOM
1.0000 | Freq: Three times a day (TID) | ORAL | Status: DC
  Administered 2022-04-18 – 2022-04-24 (×14): 1 via ORAL

## 2022-04-18 NOTE — Evaluation (Signed)
Physical Therapy Evaluation Patient Details Name: Austin Oliver MRN: [id number] DOB: Dec 23, 1955 Today's Date: 04/18/2022  History of Present Illness  66 y.o. 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 Pt transferred from inpatient rehab to [facility] for chemotherapy.  Clinical Impression  Pt admitted with above diagnosis. No functional movement noted in BLEs. Performed gentle PROM to BLEs. With attempt at supine to sit, pt c/o severe pain in chest, R shoulder, R groin and was unable to tolerate minimal movement. RN notified of pt's pain not being well managed with ultram at this time. Pt is hoping to return to inpatient rehab.  Pt currently with functional limitations due to the deficits listed below (see PT Problem List). Pt will benefit from skilled PT to increase their independence and safety with mobility to allow discharge to the venue listed below.          Recommendations for follow up therapy are one component of a multi-disciplinary discharge planning process, led by the attending physician.  Recommendations may be updated based on patient status, additional functional criteria and insurance authorization.  Follow Up Recommendations Acute inpatient rehab (3hours/day)    Assistance Recommended at Discharge Frequent or constant Supervision/Assistance  Patient can return home with the following  Two people to help with walking and/or transfers;Two people to help with bathing/dressing/bathroom;Assist for transportation;Help with stairs or ramp for entrance;Assistance with feeding;Assistance with  cooking/housework    Equipment Recommendations None recommended by PT  Recommendations for Other Services       Functional Status Assessment Patient has had a recent decline in their functional status and demonstrates the ability to make significant improvements in function in a reasonable and predictable amount of time.     Precautions / Restrictions Precautions Precautions: Fall Precaution Comments: new lymphoma dx with spinal mets, BLE weakness Restrictions Weight Bearing Restrictions: No      Mobility  Bed Mobility Overal bed mobility: Needs Assistance       Supine to sit: +2 for physical assistance, Total assist     General bed mobility comments: pt c/o significant pain in R groin/scrotum, R shoulder and chest with minimal movement with initiating supine to sit, pt unable to tolerate activity 2* pain. Pt had ultram this morning, it is not due for another hour. RN notified of pt's pain not being well managed, she will text MD.    Transfers                   General transfer comment: unable    Ambulation/Gait                  Stairs            Wheelchair Mobility    Modified Rankin (Stroke Patients Only)       Balance                                             Pertinent Vitals/Pain Pain Assessment Faces Pain Scale: Hurts whole lot Pain Location: R shoulder, chest, scrotum wtih  movement Pain Descriptors / Indicators: Grimacing, Guarding Pain Intervention(s): Limited activity within patient's tolerance, Premedicated before session, Patient requesting pain meds-RN notified, Repositioned    Home Living Family/patient expects to be discharged to:: Inpatient rehab Living Arrangements: Spouse/significant other Available Help at Discharge: Family;Available 24 hours/day Type of Home: House Home Access: Ramped entrance Entrance Stairs-Rails: None Entrance Stairs-Number of Steps: 1   Home Layout: Multi-level;Able to  live on main level with bedroom/bathroom Home Equipment: Rolling Walker (2 wheels);BSC/3in1;Cane - single point;Shower seat;Grab bars - toilet;Grab bars - tub/shower;Wheelchair - manual Additional Comments: Has a wc, RW, and shower seat per pt report    Prior Function               Mobility Comments: At inpatient rehab pt was using a WC and sliding board for transfers       Hand Dominance   Dominant Hand: Right    Extremity/Trunk Assessment   Upper Extremity Assessment Upper Extremity Assessment: Defer to OT evaluation    Lower Extremity Assessment Lower Extremity Assessment: RLE deficits/detail;LLE deficits/detail RLE Deficits / Details: No AROM at ankle. Minimal muscle activation at hip and knee with MMT. No functional movement. RLE Sensation: decreased light touch LLE Deficits / Details: No AROM at ankle. Minimal muscle activation at hip and knee with MMT. No functional movement. LLE Sensation: decreased light touch    Cervical / Trunk Assessment Cervical / Trunk Assessment: Normal  Communication   Communication: HOH  Cognition Arousal/Alertness: Awake/alert Behavior During Therapy: Flat affect Overall Cognitive Status: Within Functional Limits for tasks assessed                                          General Comments      Exercises General Exercises - Lower Extremity Ankle Circles/Pumps: PROM, Both, 5 reps, Supine Heel Slides: AAROM, PROM, 5 reps, Both Hip ABduction/ADduction: AROM, Both, 5 reps, Supine   Assessment/Plan    PT Assessment Patient needs continued PT services  PT Problem List Decreased strength;Decreased activity tolerance;Decreased balance;Decreased mobility;Pain;Impaired sensation       PT Treatment Interventions DME instruction;Functional mobility training;Therapeutic activities;Therapeutic exercise;Balance training;Patient/family education    PT Goals (Current goals can be found in the Care Plan section)  Acute  Rehab PT Goals Patient Stated Goal: to get stronger PT Goal Formulation: With patient Time For Goal Achievement: 04/09/22 Potential to Achieve Goals: Good    Frequency Min 3X/week     Co-evaluation PT/OT/SLP Co-Evaluation/Treatment: Yes Reason for Co-Treatment: Complexity of the patient's impairments (multi-system involvement);Necessary to address cognition/behavior during functional activity;For patient/therapist safety PT goals addressed during session: Mobility/safety with mobility;Strengthening/ROM         AM-PAC PT "6 Clicks" Mobility  Outcome Measure Help needed turning from your back to your side while in a flat bed without using bedrails?: Total Help needed moving from lying on your back to sitting on the side of a flat bed without using bedrails?: Total Help needed moving to and from a bed to a chair (including a wheelchair)?: Total Help needed standing up from a chair using your arms (e.g., wheelchair or bedside chair)?: Total Help needed to walk in hospital room?: Total Help needed climbing 3-5 steps with a railing? : Total 6 Click Score: 6    End of Session   Activity Tolerance: Patient limited by pain Patient left: in bed;with bed alarm set;with call bell/phone within reach;with family/visitor  present Nurse Communication: Mobility status;Need for lift equipment PT Visit Diagnosis: Muscle weakness (generalized) (M62.81);Other abnormalities of gait and mobility (R26.89);Other symptoms and signs involving the nervous system (R29.898)    Time: 1052-1110 PT Time Calculation (min) (ACUTE ONLY): 18 min   Charges:   PT Evaluation $PT Eval Moderate Complexity: 1 Mod         Philomena Doheny PT 04/18/2022  Acute Rehabilitation Services Pager [phone] Office [phone]

## 2022-04-18 NOTE — Evaluation (Signed)
Occupational Therapy Evaluation Patient Details Name: Austin Oliver MRN: [id number] DOB: 1955-12-12 Today's Date: 04/18/2022   History of Present Illness 66 y.o. 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 Pt seen in 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 Pt transferred from inpatient rehab to [facility] for chemotherapy.   Clinical Impression   [name] is a 66 year old man who presents with generalized weakness, decreased activity tolerance, impaired coordination of Ues, decreased sensation, ROM and strength in lower extremities, assumed poor balance and significant pain. Patient has recently been at [place] where he was able to perform UB ADLS with set up in supine or seated position, able to transfer to wheelchair via slide board. Today patient in a lot of pain, exhibits no movement in RLE, minimal movement in LLE, weaker upper extremities with myoclonus limiting ability to resist and feed himself. He reports burning pain in his left foot. Patient's evaluation limited to bed level due to pain. Patient mod-total assist for bed level ADLs including needing assistance for grooming and feeding compared to last week. Patient will benefit from skilled OT services while in hospital to improve deficits and learn compensatory strategies as needed in order to improve functional abilities. Patient and family goals is to improve in order to return to [facility].     Recommendations for follow up therapy are one component of a multi-disciplinary discharge planning process, led by the attending physician.  Recommendations may be updated based on patient status, additional functional  criteria and insurance authorization.   Follow Up Recommendations  Acute inpatient rehab (3hours/day)    Assistance Recommended at Discharge Frequent or constant Supervision/Assistance  Patient can return home with the following Two people to help with walking and/or transfers;Assist for transportation;Help with stairs or ramp for entrance;Two people to help with bathing/dressing/bathroom    Functional Status Assessment  Patient has had a recent decline in their functional status and demonstrates the ability to make significant improvements in function in a reasonable and predictable amount of time.  Equipment Recommendations  Other (comment) (TBD)    Recommendations for Other Services       Precautions / Restrictions Precautions Precautions: Fall Precaution Comments: new lymphoma dx with spinal mets, BLE weakness Restrictions Weight Bearing Restrictions: No      Mobility Bed Mobility                    Transfers                          Balance                                           ADL either performed or assessed with clinical judgement   ADL Overall ADL's : Needs assistance/impaired Eating/Feeding: Moderate assistance;Set up;Bed level Eating/Feeding Details (indicate cue type and reason): needing assistance to hold objects and feed self today. Increased weakness and myoclonus Grooming: Maximal assistance;Bed level   Upper Body Bathing: Bed level;Moderate assistance   Lower Body Bathing: Total assistance;Bed level   Upper Body Dressing : Maximal  assistance;Bed level   Lower Body Dressing: Total assistance;Bed level     Toilet Transfer Details (indicate cue type and reason): unable - deferred Toileting- Clothing Manipulation and Hygiene: Total assistance;Bed level         General ADL Comments: Unable to tolerate much acitvity due to pain. Requested pain meds from RN. Limited by UE weakness and myoclonus in UEs      Vision Baseline Vision/History: 1 Wears glasses Ability to See in Adequate Light: 1 Impaired Patient Visual Report: No change from baseline       Perception     Praxis      Pertinent Vitals/Pain Pain Assessment Pain Assessment: Faces Faces Pain Scale: Hurts even more Pain Location: R shoulder, chest, scrotum wtih movement Pain Descriptors / Indicators: Grimacing, Guarding Pain Intervention(s): Limited activity within patient's tolerance, Patient requesting pain meds-RN notified, Repositioned     Hand Dominance Right   Extremity/Trunk Assessment Upper Extremity Assessment Upper Extremity Assessment: RUE deficits/detail;LUE deficits/detail RUE Deficits / Details: 3-/5 shoulder strength otherwise grossly 4-/5 strength - exhibiting some myoclonus limting ability to resist during MMT RUE Coordination: decreased fine motor;decreased gross motor LUE Deficits / Details: 3-/h shoudler strength otherwise 4/5 strength - exhibiting some myoclonus effecting ability to resist during MMT LUE Coordination: decreased gross motor;decreased fine motor   Lower Extremity Assessment Lower Extremity Assessment: Defer to PT evaluation RLE Deficits / Details: No AROM at ankle. Minimal muscle activation at hip and knee with MMT. No functional movement. RLE Sensation: decreased light touch LLE Deficits / Details: No AROM at ankle. Minimal muscle activation at hip and knee with MMT. No functional movement. LLE Sensation: decreased light touch   Cervical / Trunk Assessment Cervical / Trunk Assessment: Normal   Communication Communication Communication: HOH   Cognition Arousal/Alertness: Awake/alert Behavior During Therapy: Flat affect Overall Cognitive Status: Within Functional Limits for tasks assessed                                 General Comments: HOH     General Comments       Exercises     Shoulder Instructions      Home Living Family/patient expects to be  discharged to:: Inpatient rehab Living Arrangements: Spouse/significant other Available Help at Discharge: Family;Available 24 hours/day Type of Home: House Home Access: Ramped entrance Entrance Stairs-Number of Steps: 1 Entrance Stairs-Rails: None Home Layout: Multi-level;Able to live on main level with bedroom/bathroom     Bathroom Shower/Tub: Occupational psychologist: Standard Bathroom Accessibility: Yes   Home Equipment: Conservation officer, nature (2 wheels);BSC/3in1;Cane - single point;Shower seat;Grab bars - toilet;Grab bars - tub/shower;Wheelchair - manual   Additional Comments: Has a wc, RW, and shower seat per pt report  Lives With: Spouse    Prior Functioning/Environment               Mobility Comments: At inpatient rehab pt was using a WC and sliding board for transfers ADLs Comments: needing assistance for ADLs - at [facility] able to assist with UB ADLs        OT Problem List: Decreased strength;Decreased range of motion;Decreased activity tolerance;Impaired balance (sitting and/or standing);Decreased safety awareness;Impaired UE functional use;Decreased cognition;Decreased knowledge of use of DME or AE;Pain;Impaired sensation;Decreased coordination      OT Treatment/Interventions: Self-care/ADL training;DME and/or AE instruction;Therapeutic activities;Patient/family education;Balance training    OT Goals(Current goals can be found in the care plan section) Acute Rehab OT Goals  Patient Stated Goal: use arms OT Goal Formulation: With patient Time For Goal Achievement: 05/02/22 Potential to Achieve Goals: Fair  OT Frequency: Min 2X/week    Co-evaluation PT/OT/SLP Co-Evaluation/Treatment: Yes (coeval) Reason for Co-Treatment: Complexity of the patient's impairments (multi-system involvement);To address functional/ADL transfers;For patient/therapist safety PT goals addressed during session: Mobility/safety with mobility;Strengthening/ROM        AM-PAC OT "6 Clicks"  Daily Activity     Outcome Measure Help from another person eating meals?: A Lot Help from another person taking care of personal grooming?: A Lot Help from another person toileting, which includes using toliet, bedpan, or urinal?: Total Help from another person bathing (including washing, rinsing, drying)?: A Lot Help from another person to put on and taking off regular upper body clothing?: A Lot Help from another person to put on and taking off regular lower body clothing?: Total 6 Click Score: 10   End of Session Nurse Communication: Patient requests pain meds  Activity Tolerance: Patient limited by pain Patient left: in bed;with call bell/phone within reach;with family/visitor present  OT Visit Diagnosis: Unsteadiness on feet (R26.81);Other abnormalities of gait and mobility (R26.89);Muscle weakness (generalized) (M62.81);History of falling (Z91.81);Pain;Other symptoms and signs involving the nervous system (R29.898)                Time: 1052-1110 OT Time Calculation (min): 18 min Charges:  OT General Charges $OT Visit: 1 Visit OT Evaluation $OT Eval Low Complexity: 1 Low  Seanpaul Preece, OTR/L [place]  Office [phone] Pager: [place] 04/18/2022, 12:52 PM

## 2022-04-18 NOTE — Progress Notes (Signed)
Inpatient Diabetes Program Recommendations  AACE/ADA: New Consensus Statement on Inpatient Glycemic Control (2015)  Target Ranges:  Prepandial:   less than 140 mg/dL      Peak postprandial:   less than 180 mg/dL (1-2 hours)      Critically ill patients:  140 - 180 mg/dL   Lab Results  Component Value Date   GLUCAP 288 (H) 04/18/2022   HGBA1C 5.4 03/16/2022    Review of Glycemic Control  Latest Reference Range & Units 04/17/22 16:41 04/17/22 21:01 04/18/22 07:31 04/18/22 12:05  Glucose-Capillary 70 - 99 mg/dL 208 (H) 202 (H) 222 (H) 288 (H)  (H): Data is abnormally high  Diabetes history: DM2 Outpatient Diabetes medications: Diet controlled Current orders for Inpatient glycemic control: Novolog 0-9 units TID and 0-5 units QHS, Prednisone 60 mg QAM, received 2 doses of decadron  Inpatient Diabetes Program Recommendations:    Might consider small dose of basal while on steroids.  -Semglee 10 units QD -Novolog 0-15 units TID  Will continue to follow while inpatient.  Thank you, Reche Dixon, MSN, [place] Diabetes Coordinator Inpatient Diabetes Program [phone] (team pager from 8a-5p)

## 2022-04-18 NOTE — Progress Notes (Signed)
Subjective: The patient is seen and examined today.  His wife was at the bedside.  Received his first cycle of R-CHOP yesterday.  Developed abdominal pain during the initial 10 to 15 minutes of rituximab and this was ordered to be discontinued and reattempt again today.  This morning, the patient is extremely fatigued.  Has some difficulty staying awake.  Really no longer able to move his legs.  States that he cannot feel his legs.  He is not having any abdominal pain, nausea, vomiting.  Reports constipation.  Not eating or drinking very well.  Objective: Vital signs in last 24 hours: Temp:  [97.7 F (36.5 C)-98.4 F (36.9 C)] 97.7 F (36.5 C) (06/02 0500) Pulse Rate:  [79-102] 79 (06/02 0500) Resp:  [18] 18 (06/02 0500) BP: (94-156)/(57-142) 113/70 (06/02 0500) SpO2:  [92 %-97 %] 93 % (06/02 0747)  Intake/Output from previous day: 06/01 0701 - 06/02 0700 In: 3780.8 [P.O.:235; IV Piggyback:3545.8] Out: 1350 [Urine:1350] Intake/Output this shift: No intake/output data recorded.  Physical Exam Vitals reviewed.  Constitutional:      Appearance: He is ill-appearing.  HENT:     Head: Normocephalic.  Eyes:     General: No scleral icterus. Cardiovascular:     Rate and Rhythm: Normal rate.  Pulmonary:     Effort: Pulmonary effort is normal. No respiratory distress.  Abdominal:     Palpations: Abdomen is soft.     Tenderness: There is no abdominal tenderness.  Musculoskeletal:     Comments: Unable to raise his legs or wiggle toes.  Sensation intact.  Skin:    General: Skin is warm and dry.     Lab Results:  Recent Labs    04/16/22 0600 04/18/22 0743  WBC 4.2 4.4  HGB 10.0* 9.9*  HCT 30.4* 30.6*  PLT 134* 106*   BMET Recent Labs    04/16/22 0600 04/18/22 0743  NA 136 133*  K 5.3* 5.5*  CL 101 99  CO2 24 25  GLUCOSE 166* 224*  BUN 44* 62*  CREATININE 0.99 1.11  CALCIUM 9.5 9.0    Studies/Results: IR IMAGING GUIDED PORT INSERTION  Result Date:  04/17/2022 CLINICAL DATA:  Lymphoma, needs durable venous access for planned treatment regimen EXAM: TUNNELED PORT CATHETER PLACEMENT WITH ULTRASOUND AND FLUOROSCOPIC GUIDANCE FLUOROSCOPY: Radiation Exposure Index (as provided by the fluoroscopic device): 29 mGy air Kerma ANESTHESIA/SEDATION: Intravenous Fentanyl 163mg and Versed '2mg'$  were administered as conscious sedation during continuous monitoring of the patient's level of consciousness and physiological / cardiorespiratory status by the radiology RN, with a total moderate sedation time of 22 minutes. TECHNIQUE: The procedure, risks, benefits, and alternatives were explained to the patient. Questions regarding the procedure were encouraged and answered. The patient understands and consents to the procedure. Patency of the right IJ vein was confirmed with ultrasound with image documentation. An appropriate skin site was determined. Skin site was marked. Region was prepped using maximum barrier technique including cap and mask, sterile gown, sterile gloves, large sterile sheet, and Chlorhexidine as cutaneous antisepsis. The region was infiltrated locally with 1% lidocaine. Under real-time ultrasound guidance, the right IJ vein was accessed with a 21 gauge micropuncture needle; the needle tip within the vein was confirmed with ultrasound image documentation. Needle was exchanged over a 018 guidewire for transitional dilator, and vascular measurement was performed. A small incision was made on the right anterior chest wall and a subcutaneous pocket fashioned. The power-injectable port was positioned and its catheter tunneled to the right IJ dermatotomy  site. The transitional dilator was exchanged over an Amplatz wire for a peel-away sheath, through which the port catheter, which had been trimmed to the appropriate length, was advanced and positioned under fluoroscopy with its tip at the cavoatrial junction. Spot chest radiograph confirms good catheter position and  no pneumothorax. The port was flushed per protocol. The pocket was closed with deep interrupted and subcuticular continuous 3-0 Monocryl sutures. The incisions were covered with Dermabond then covered with a sterile dressing. The patient tolerated the procedure well. COMPLICATIONS: COMPLICATIONS None immediate IMPRESSION: Technically successful right IJ power-injectable port catheter placement. Ready for routine use. Electronically Signed   By: Lucrezia Europe M.D.   On: 04/17/2022 08:00    Medications: I have reviewed the patient's current medications.   Assessment/Plan: This is a 66 year old male recently diagnosed with diffuse large B cell non-Hodgkin's lymphoma involving abdominal lymphadenopathy in addition to liver lesions suspicious for bone metastasis.  His overall performance status has been declining recently.  He was participating with inpatient rehab at [facility] and then transferred to [facility] for chemotherapy administration.  He received his first cycle of R-CHOP beginning on 04/17/2022.  He developed abdominal pain during the Rituxan infusion and this was discontinued.  Plan is to rechallenge again today.  He is at high risk for tumor lysis syndrome and labs are being monitored closely.  He remains on allopurinol.  He is not eating or drinking very well.  Reports continued decreased strength of his lower extremities.  He was evaluated by radiation oncology and there are no plans for radiation.  It was felt that his symptoms may improve with chemotherapy alone.  #Diffuse large B cell non-Hodgkin's lymphoma --He has abdominal lymphadenopathy, liver lesions, and suspected bone metastasis. --Biopsy of one of his liver lesions performed on 04/07/2022 consistent with high-grade B-cell lymphoma. --Echocardiogram performed on 04/10/2022 showed LVEF of 55 to 60%. --Hepatitis serologies negative. --He has been started on allopurinol due to risk for tumor lysis syndrome. --Received his first cycle of  R-CHOP on 04/17/2022.  Had a reaction to Rituxan consisting of abdominal pain.  Rituxan infusion was not completed.  Plan to rechallenge again today. --Orders placed for rituximab today.  Will not increase the dose.  We will premedicate with Tylenol, Benadryl, Solu-Medrol, Pepcid. -- Granix 480 mcg subcu daily will be initiated today x5 days. --Continue to monitor daily labs. --We will begin normal saline at 50cc/h due to poor p.o. intake and risk for tumor lysis syndrome.  #Anemia and thrombocytopenia --The patient has baseline anemia and thrombocytopenia. --Labs from this morning have been reviewed and overall stable. --We will continue to monitor.  #History of spinal cord infarct --Continues to have lower extremity weakness. --PT eval requested.  Will need rehab following hospital discharge.  #Constipation --Continue Colace and will add MiraLAX daily.   LOS: 3 days    [name] 04/18/2022

## 2022-04-18 NOTE — Progress Notes (Signed)
[facility] PROGRESS NOTE    Progress Note  Austin Oliver  SHF:[id number] DOB: 14-Jan-1956 DOA: 04/13/2022 PCP: [name], MD     Brief Narrative:   Austin Oliver is an 66 y.o. 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  Oncology was consulted and recommended moved to [facility] for urgent chemotherapy    Assessment/Plan:   High-grade B-cell lymphoma: Admitted to inpatient. Oncology has been consulted. IR has been consulted for port placement Oncology to start chemotherapy 6.1.2023, cycle every 21 days of prednisone rituximab cyclophosphamide, doxorubicin and vincristine. Had a bad reaction and did not tolerate Rituxan. Oncology decided to premedicate him and try again Rituxan.  History of recurrent CVA and history of spinal cord infarction [facility]): In by neurologist on last admission concerned about hypercoagulable state due to his spinal cord infarct on the previous admission. Continue aspirin and Plavix. Post Port-A-Cath placement on 04/16/2022 Continue Crestor and Zetia.  Essential hypertension: Blood pressure soft continue to hold home regimen.  COPD: Resume home regimen.  Sick sinus syndrome: Continue current home regimen.    CAD/hyperlipidemia: Continue Crestor and statins.  Pituitary microadenoma: Follow-up with neurosurgery as an outpatient.  BPH: Continue Flomax and finasteride continue Foley.  Diabetes mellitus type 2 with hyperglycemia: With a last A1c of 5.4.  Tolerating his  diet Continue sliding scale insulin.  Morbid obesity: Noted.    DVT prophylaxis: lovenox Family Communication:none Status is: Inpatient Remains inpatient appropriate because: Newly diagnosed high-grade lymphoma starting chemotherapy as an inpatient.    Code Status:     Code Status Orders  (From admission, onward)           Start     Ordered   03/20/2022 1701  Full code  Continuous        03/21/2022 1703           Code Status History     Date Active Date Inactive Code Status Order ID Comments User Context   04/04/2022 1429 03/28/2022 1531 Full Code [id number]  Bary Leriche, PA-C Inpatient   03/25/2022 1919 04/04/2022 1424 Full Code [id number]  Elodia Florence., MD ED   03/16/2022 1557 03/18/2022 2147 Full Code [id number]  Domenic Polite, MD Inpatient   11/20/2021 2246 11/23/2021 2005 Full Code [id number]  Florencia Reasons, MD ED   10/31/2021 1747 11/01/2021 2024 Full Code [id number]  Rod Can, MD Inpatient   04/25/2021 1300 04/25/2021 2113 Full Code [id number]  [name], MD Inpatient   12/04/2020 1205 12/04/2020 2214 Full Code [id number]  [name], MD Inpatient   08/24/2020 1328 08/24/2020 2238 Full Code [id number]  [name], MD Inpatient   03/09/2017 1810 03/11/2017 1707 Full Code [id number]  Rod Can, MD Inpatient         IV Access:   Peripheral IV   Procedures and diagnostic studies:   IR IMAGING GUIDED PORT INSERTION  Result Date: 04/17/2022 CLINICAL DATA:  Lymphoma, needs durable venous access for planned treatment regimen EXAM: TUNNELED PORT CATHETER PLACEMENT WITH ULTRASOUND AND FLUOROSCOPIC GUIDANCE FLUOROSCOPY: Radiation Exposure Index (as provided by the fluoroscopic device): 29  mGy air Kerma ANESTHESIA/SEDATION: Intravenous Fentanyl 164mg and Versed '2mg'$  were administered as conscious sedation during continuous monitoring of the patient's level of consciousness and physiological / cardiorespiratory status by the radiology RN, with a total  moderate sedation time of 22 minutes. TECHNIQUE: The procedure, risks, benefits, and alternatives were explained to the patient. Questions regarding the procedure were encouraged and answered. The patient understands and consents to the procedure. Patency of the right IJ vein was confirmed with ultrasound with image documentation. An appropriate skin site was determined. Skin site was marked. Region was prepped using maximum barrier technique including cap and mask, sterile gown, sterile gloves, large sterile sheet, and Chlorhexidine as cutaneous antisepsis. The region was infiltrated locally with 1% lidocaine. Under real-time ultrasound guidance, the right IJ vein was accessed with a 21 gauge micropuncture needle; the needle tip within the vein was confirmed with ultrasound image documentation. Needle was exchanged over a 018 guidewire for transitional dilator, and vascular measurement was performed. A small incision was made on the right anterior chest wall and a subcutaneous pocket fashioned. The power-injectable port was positioned and its catheter tunneled to the right IJ dermatotomy site. The transitional dilator was exchanged over an Amplatz wire for a peel-away sheath, through which the port catheter, which had been trimmed to the appropriate length, was advanced and positioned under fluoroscopy with its tip at the cavoatrial junction. Spot chest radiograph confirms good catheter position and no pneumothorax. The port was flushed per protocol. The pocket was closed with deep interrupted and subcuticular continuous 3-0 Monocryl sutures. The incisions were covered with Dermabond then covered with a sterile dressing. The patient tolerated the procedure well. COMPLICATIONS: COMPLICATIONS None immediate IMPRESSION: Technically successful right IJ power-injectable port catheter placement. Ready for routine use. Electronically Signed   By: DLucrezia EuropeM.D.   On: 04/17/2022 08:00     Medical Consultants:    None.   Subjective:    Austin ANDONIANno complaints, he wants to try doing the Rituxan.  Objective:    Vitals:   04/17/22 1950 04/18/22 0000 04/18/22 0500 04/18/22 0747  BP: 98/67 111/70 113/70   Pulse: 81 86 79   Resp: '18 18 18   '$ Temp: 97.7 F (36.5 C) 97.7 F (36.5 C) 97.7 F (36.5 C)   TempSrc: Oral Oral Oral   SpO2: 96% 97% 92% 93%  Weight:      Height:       SpO2: 93 % O2 Flow Rate (L/min): 2 L/min   Intake/Output Summary (Last 24 hours) at 04/18/2022 1007 Last data filed at 04/18/2022 0500 Gross per 24 hour  Intake 3780.83 ml  Output 1350 ml  Net 2430.83 ml    Filed Weights   03/23/2022 1744  Weight: 93 kg    Exam: General exam: In no acute distress. Respiratory system: Good air movement and clear to auscultation. Cardiovascular system: S1 & S2 heard, RRR. No JVD. Gastrointestinal system: Abdomen is nondistended, soft and nontender.  Extremities: No pedal edema. Skin: No rashes, lesions or ulcers Psychiatry: Judgement and insight appear normal. Mood & affect appropriate.  Data Reviewed:    Labs: Basic Metabolic Panel: Recent Labs  Lab 04/12/22 0539 04/14/22 0609 03/17/2022 1814 04/16/22 0600 04/18/22 0743  NA 132* 132* 136 136 133*  K 4.6 4.4 5.2* 5.3* 5.5*  CL 95* 97* 99 101 99  CO2 '23 23 26 24 25  '$ GLUCOSE 164* 190* 160* 166* 224*  BUN 30* 30* 43* 44* 62*  CREATININE 1.05 1.15  1.02 0.99 1.11  CALCIUM 8.6* 9.2 9.6 9.5 9.0  PHOS  --   --   --   --  5.2*    GFR Estimated Creatinine Clearance: 72.7 mL/min (by C-G formula based on SCr of 1.11 mg/dL). Liver Function Tests: Recent Labs  Lab 04/14/22 0609 03/20/2022 1814 04/16/22 0600 04/18/22 0743  AST 31 31 34 52*  ALT '29 27 27 30  '$ ALKPHOS 113 109 102 99  BILITOT 2.0* 2.7* 2.8* 5.2*  PROT 5.4* 5.9* 5.5* 5.2*  ALBUMIN 1.9* 2.2* 2.2* 1.7*    No results for input(s): LIPASE, AMYLASE in the last 168 hours. No results for input(s): AMMONIA in the last 168 hours. Coagulation  profile No results for input(s): INR, PROTIME in the last 168 hours. COVID-19 Labs  Recent Labs    04/18/22 0743  LDH 1,036*    Lab Results  Component Value Date   SARSCOV2NAA NEGATIVE 03/25/2022   SARSCOV2NAA NEGATIVE 03/16/2022   SARSCOV2NAA POSITIVE (A) 11/20/2021   SARSCOV2NAA RESULT: NEGATIVE 10/29/2021    CBC: Recent Labs  Lab 04/14/22 0609 03/27/2022 1814 04/16/22 0600 04/18/22 0743  WBC 4.2 3.8* 4.2 4.4  NEUTROABS  --  3.0  --  3.6  HGB 9.7* 10.2* 10.0* 9.9*  HCT 30.2* 31.5* 30.4* 30.6*  MCV 88.8 89.2 89.4 87.4  PLT 156 157 134* 106*    Cardiac Enzymes: No results for input(s): CKTOTAL, CKMB, CKMBINDEX, TROPONINI in the last 168 hours. BNP (last 3 results) No results for input(s): PROBNP in the last 8760 hours. CBG: Recent Labs  Lab 04/17/22 0721 04/17/22 1157 04/17/22 1641 04/17/22 2101 04/18/22 0731  GLUCAP 154* 149* 208* 202* 222*    D-Dimer: No results for input(s): DDIMER in the last 72 hours. Hgb A1c: No results for input(s): HGBA1C in the last 72 hours. Lipid Profile: No results for input(s): CHOL, HDL, LDLCALC, TRIG, CHOLHDL, LDLDIRECT in the last 72 hours. Thyroid function studies: No results for input(s): TSH, T4TOTAL, T3FREE, THYROIDAB in the last 72 hours.  Invalid input(s): FREET3 Anemia work up: No results for input(s): VITAMINB12, FOLATE, FERRITIN, TIBC, IRON, RETICCTPCT in the last 72 hours. Sepsis Labs: Recent Labs  Lab 04/14/22 0609 03/17/2022 1814 04/16/22 0600 04/18/22 0743  WBC 4.2 3.8* 4.2 4.4    Microbiology Recent Results (from the past 240 hour(s))  Urine Culture     Status: Abnormal   Collection Time: 04/09/22 11:55 AM   Specimen: Urine, Catheterized  Result Value Ref Range Status   Specimen Description URINE, CATHETERIZED  Final   Special Requests   Final    NONE Performed at [facility], [address], [place], [place] [address]    Culture 40,000 COLONIES/mL STAPHYLOCOCCUS EPIDERMIDIS (A)  Final    Report Status 04/11/2022 FINAL  Final   Organism ID, Bacteria STAPHYLOCOCCUS EPIDERMIDIS (A)  Final      Susceptibility   Staphylococcus epidermidis - MIC*    CIPROFLOXACIN <=0.5 SENSITIVE Sensitive     GENTAMICIN <=0.5 SENSITIVE Sensitive     NITROFURANTOIN <=16 SENSITIVE Sensitive     OXACILLIN >=4 RESISTANT Resistant     TETRACYCLINE 2 SENSITIVE Sensitive     VANCOMYCIN 2 SENSITIVE Sensitive     TRIMETH/SULFA 20 SENSITIVE Sensitive     CLINDAMYCIN 4 RESISTANT Resistant     RIFAMPIN <=0.5 SENSITIVE Sensitive     Inducible Clindamycin NEGATIVE Sensitive     * 40,000 COLONIES/mL STAPHYLOCOCCUS EPIDERMIDIS     Medications:    acetaminophen  650 mg Oral  Once   allopurinol  100 mg Oral BID   aspirin EC  81 mg Oral QPM   Chlorhexidine Gluconate Cloth  6 each Topical Daily   clopidogrel  75 mg Oral Daily   diphenhydrAMINE  25 mg Intravenous Once   docusate sodium  100 mg Oral BID   enoxaparin (LOVENOX) injection  40 mg Subcutaneous Q24H   ezetimibe  10 mg Oral q AM   feeding supplement  1 Container Oral TID BM   finasteride  5 mg Oral QHS   gabapentin  200 mg Oral QHS   insulin aspart  0-5 Units Subcutaneous QHS   insulin aspart  0-9 Units Subcutaneous TID WC   insulin aspart  3 Units Subcutaneous TID WC   levothyroxine  112 mcg Oral QAC breakfast   methylPREDNISolone (SOLU-MEDROL) injection  80 mg Intravenous Once   multivitamin with minerals  1 tablet Oral Daily   pantoprazole  40 mg Oral QPC supper   polyethylene glycol  17 g Oral Daily   [START ON 04/19/2022] predniSONE  60 mg Oral Q breakfast   riTUXimab-pvvr (RUXIENCE) IV infusion  375 mg/m2 (Treatment Plan Recorded) Intravenous Once   rosuvastatin  5 mg Oral Daily   tamsulosin  0.8 mg Oral Q supper   Tbo-filgastrim (GRANIX) SQ  480 mcg Subcutaneous Daily   umeclidinium bromide  1 puff Inhalation Daily   vitamin B-12  1,000 mcg Oral q AM   Continuous Infusions:  sodium chloride     famotidine (PEPCID) IV      sodium chloride 250 mL/hr at 04/17/22 1449      LOS: 3 days   Charlynne Cousins  [facility]  04/18/2022, 10:07 AM

## 2022-04-18 NOTE — Care Management Important Message (Signed)
Important Message  Patient Details IM Letter placed in Patients room. Name: Austin Oliver MRN: [id number] Date of Birth: 1956/08/09   Medicare Important Message Given:  Yes     Kerin Salen 04/18/2022, 11:14 AM

## 2022-04-18 NOTE — Progress Notes (Signed)
Inpatient Rehabilitation Admissions Coordinator    Noted PT eval and limitations due to pain today. I left his wife a voicemail to call me to discuss overall eventual rehab plans and expectations. Noted ongoing medical issues.  Danne Baxter, RN, MSN Rehab Admissions Coordinator [phone] 04/18/2022 12:02 PM

## 2022-04-19 LAB — URINALYSIS, ROUTINE W REFLEX MICROSCOPIC
Bilirubin Urine: NEGATIVE
Glucose, UA: NEGATIVE mg/dL
Ketones, ur: NEGATIVE mg/dL
Nitrite: POSITIVE — AB
Protein, ur: NEGATIVE mg/dL
Specific Gravity, Urine: 1.021 (ref 1.005–1.030)
WBC, UA: >50 WBC/hpf — ABNORMAL HIGH (ref 0–5)
pH: 5 (ref 5.0–8.0)

## 2022-04-19 LAB — COMPREHENSIVE METABOLIC PANEL
ALT: 37 U/L (ref 0–44)
AST: 70 U/L — ABNORMAL HIGH (ref 15–41)
Albumin: 1.6 g/dL — ABNORMAL LOW (ref 3.5–5.0)
Alkaline Phosphatase: 113 U/L (ref 38–126)
Anion gap: 6 (ref 5–15)
BUN: 59 mg/dL — ABNORMAL HIGH (ref 8–23)
CO2: 25 mmol/L (ref 22–32)
Calcium: 8.3 mg/dL — ABNORMAL LOW (ref 8.9–10.3)
Chloride: 102 mmol/L (ref 98–111)
Creatinine, Ser: 0.96 mg/dL (ref 0.61–1.24)
GFR, Estimated: >60 mL/min (ref >60)
Glucose, Bld: 291 mg/dL — ABNORMAL HIGH (ref 70–99)
Potassium: 5 mmol/L (ref 3.5–5.1)
Sodium: 133 mmol/L — ABNORMAL LOW (ref 135–145)
Total Bilirubin: 4.4 mg/dL — ABNORMAL HIGH (ref 0.3–1.2)
Total Protein: 4.9 g/dL — ABNORMAL LOW (ref 6.5–8.1)

## 2022-04-19 LAB — CBC WITH DIFFERENTIAL/PLATELET
Abs Immature Granulocytes: 0.29 10*3/uL — ABNORMAL HIGH (ref 0.00–0.07)
Basophils Absolute: 0 10*3/uL (ref 0.0–0.1)
Basophils Relative: 0 %
Eosinophils Absolute: 0 10*3/uL (ref 0.0–0.5)
Eosinophils Relative: 0 %
HCT: 28.8 % — ABNORMAL LOW (ref 39.0–52.0)
Hemoglobin: 9.3 g/dL — ABNORMAL LOW (ref 13.0–17.0)
Immature Granulocytes: 3 %
Lymphocytes Relative: 2 %
Lymphs Abs: 0.2 10*3/uL — ABNORMAL LOW (ref 0.7–4.0)
MCH: 28.1 pg (ref 26.0–34.0)
MCHC: 32.3 g/dL (ref 30.0–36.0)
MCV: 87 fL (ref 80.0–100.0)
Monocytes Absolute: 0.3 10*3/uL (ref 0.1–1.0)
Monocytes Relative: 3 %
Neutro Abs: 8.6 10*3/uL — ABNORMAL HIGH (ref 1.7–7.7)
Neutrophils Relative %: 92 %
Platelets: 93 10*3/uL — ABNORMAL LOW (ref 150–400)
RBC: 3.31 MIL/uL — ABNORMAL LOW (ref 4.22–5.81)
RDW: 19.3 % — ABNORMAL HIGH (ref 11.5–15.5)
WBC: 9.4 10*3/uL (ref 4.0–10.5)
nRBC: 0 % (ref 0.0–0.2)

## 2022-04-19 LAB — GLUCOSE, CAPILLARY
Glucose-Capillary: 261 mg/dL — ABNORMAL HIGH (ref 70–99)
Glucose-Capillary: 274 mg/dL — ABNORMAL HIGH (ref 70–99)
Glucose-Capillary: 229 mg/dL — ABNORMAL HIGH (ref 70–99)
Glucose-Capillary: 260 mg/dL — ABNORMAL HIGH (ref 70–99)

## 2022-04-19 LAB — PHOSPHORUS: Phosphorus: 4 mg/dL (ref 2.5–4.6)

## 2022-04-19 LAB — BILIRUBIN, DIRECT: Bilirubin, Direct: 2.8 mg/dL — ABNORMAL HIGH (ref 0.0–0.2)

## 2022-04-19 LAB — URIC ACID: Uric Acid, Serum: 5.7 mg/dL (ref 3.7–8.6)

## 2022-04-19 LAB — LACTATE DEHYDROGENASE: LDH: 1352 U/L — ABNORMAL HIGH (ref 98–192)

## 2022-04-19 MED ORDER — SODIUM CHLORIDE 0.9% FLUSH
10.0000 mL | Freq: Two times a day (BID) | INTRAVENOUS | Status: DC
  Administered 2022-04-19 – 2022-04-25 (×11): 10 mL

## 2022-04-19 MED ORDER — SODIUM CHLORIDE 0.9% FLUSH: 10.0000 mL | INTRAVENOUS | Status: DC | PRN

## 2022-04-19 MED ORDER — SODIUM CHLORIDE 0.9 % IV SOLN: INTRAVENOUS | Status: AC

## 2022-04-19 NOTE — Progress Notes (Signed)
Brief Hematology/Oncology Progress Note  Clinical Summary: [name] is a 66 year old male with diagnosis of advanced stage diffuse large B-cell lymphoma currently admitted for R-CHOP chemotherapy.  Interval History: -- Tolerated rituximab yesterday well at reduced dose with premedications --2 small bowel movements today --Patient has poor appetite but is eating and drinking fluids --Notes he had some abdominal pain yesterday which is improved today --Denies any fevers, chills, sweats, nausea, vomiting or diarrhea.  O:  Vitals:   04/19/22 0754 04/19/22 1344  BP:  108/67  Pulse:  93  Resp:    Temp:  97.6 F (36.4 C)  SpO2: 96% 97%      Latest Ref Rng & Units 04/19/2022    5:00 AM 04/18/2022    7:43 AM 04/16/2022    6:00 AM  CMP  Glucose 70 - 99 mg/dL 291   224   166    BUN 8 - 23 mg/dL 59   62   44    Creatinine 0.61 - 1.24 mg/dL 0.96   1.11   0.99    Sodium 135 - 145 mmol/L 133   133   136    Potassium 3.5 - 5.1 mmol/L 5.0   5.5   5.3    Chloride 98 - 111 mmol/L 102   99   101    CO2 22 - 32 mmol/L '25   25   24    '$ Calcium 8.9 - 10.3 mg/dL 8.3   9.0   9.5    Total Protein 6.5 - 8.1 g/dL 4.9   5.2   5.5    Total Bilirubin 0.3 - 1.2 mg/dL 4.4   5.2   2.8    Alkaline Phos 38 - 126 U/L 113   99   102    AST 15 - 41 U/L 70   52   34    ALT 0 - 44 U/L 37   30   27        Latest Ref Rng & Units 04/19/2022    5:00 AM 04/18/2022    7:43 AM 04/16/2022    6:00 AM  CBC  WBC 4.0 - 10.5 K/uL 9.4   4.4   4.2    Hemoglobin 13.0 - 17.0 g/dL 9.3   9.9   10.0    Hematocrit 39.0 - 52.0 % 28.8   30.6   30.4    Platelets 150 - 400 K/uL 93   106   134        GENERAL: Chronically ill-appearing elderly Caucasian male in NAD  SKIN: skin color, texture, turgor are normal, no rashes or significant lesions EYES: conjunctiva are pink and non-injected, sclera clear LUNGS: clear to auscultation and percussion with normal breathing effort HEART: regular rate & rhythm and no murmurs and no lower  extremity edema Musculoskeletal: no cyanosis of digits and no clubbing  PSYCH: alert & oriented x 3, fluent speech NEURO: no focal motor/sensory deficits  Assessment/Plan:  This is a 66 year old male recently diagnosed with diffuse large B cell non-Hodgkin's lymphoma involving abdominal lymphadenopathy in addition to liver lesions suspicious for bone metastasis.  His overall performance status has been declining recently.  He was participating with inpatient rehab at [facility] and then transferred to [facility] for chemotherapy administration.  He received his first cycle of R-CHOP beginning on 04/17/2022.  He developed abdominal pain during the Rituxan infusion and this was discontinued.  Plan is to rechallenge again today.  He is at high  risk for tumor lysis syndrome and labs are being monitored closely.  He remains on allopurinol.  He is not eating or drinking very well.  Reports continued decreased strength of his lower extremities.  He was evaluated by radiation oncology and there are no plans for radiation.  It was felt that his symptoms may improve with chemotherapy alone.  #Diffuse large B cell non-Hodgkin's lymphoma --He has abdominal lymphadenopathy, liver lesions, and suspected bone metastasis. --Biopsy of one of his liver lesions performed on 04/07/2022 consistent with high-grade B-cell lymphoma. --Echocardiogram performed on 04/11/2022 showed LVEF of 55 to 60%. --Hepatitis serologies negative. --He has been started on allopurinol due to risk for tumor lysis syndrome. --Received his first cycle of R-CHOP on 04/17/2022.  Had a reaction to Rituxan consisting of abdominal pain.  Rituxan infusion was not completed on day 1 due to infusion reaction, was re-attempted and successfully completed on day 2.  --Recommend for future doses of rituximab to run slowly and premedicate with Tylenol, Benadryl, Solu-Medrol, Pepcid. Plan: --today is Cycle 1 Day 3 -- Granix 480 mcg subcu daily started  yesterday, continue x5 days. --Continue to monitor daily labs.  Labs today show creatinine 0.96, bilirubin 4.4 (direct bilirubin 2.8).  White blood cell 9.4, hemoglobin 9.3, MCV 87, and platelets of 93 --continue normal saline at 50cc/h due to poor p.o. intake and risk for tumor lysis syndrome. Uric acid at 5.7  #Anemia and thrombocytopenia --The patient has baseline anemia and thrombocytopenia. --Labs from this morning have been reviewed and overall stable. --We will continue to monitor.  #History of spinal cord infarct --Continues to have lower extremity weakness. --PT eval requested.  Will need rehab following hospital discharge.  #Constipation --Continue Colace and will add MiraLAX daily.   Ledell Peoples, MD Department of Hematology/Oncology [place] at [facility] Phone: [phone] Pager: [phone] Email: [email or web address]

## 2022-04-19 NOTE — Progress Notes (Signed)
[facility] PROGRESS NOTE    Progress Note  Austin Oliver  KKX:[id number] DOB: 1956-06-28 DOA: 03/21/2022 PCP: [name], MD     Brief Narrative:   Austin Oliver is an 66 y.o. 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  Oncology was consulted and recommended moved to [facility] for urgent chemotherapy.  Port-A-Cath was placed on 04/17/2022. Oncology to start chemotherapy 6.1.2023, cycle every 21 days of prednisone rituximab cyclophosphamide, doxorubicin and vincristine.    Assessment/Plan:   High-grade B-cell lymphoma: Admitted to inpatient. Oncology has been consulted. Was premedicated before chemotherapy seems to tolerated it well. LDH of 1300. Having red urine  History of recurrent CVA and history of spinal cord infarction [facility]): In by neurologist on last admission concerned about hypercoagulable state due to his spinal cord infarct on the previous admission. Continue aspirin and Plavix. Post Port-A-Cath placement on 04/16/2022 Continue Crestor and Zetia.  Electrolytes imbalance hyperkalemia hyperphosphatemia Resolving with IV fluids continue IV fluids for an additional 24 hours. Potassium is 5.0, phosphorus is 4.  Essential hypertension: Blood pressure soft continue to hold home regimen.  COPD: Resume home regimen.  Sick sinus syndrome: Continue current home regimen.    CAD/hyperlipidemia: Continue Crestor and statins.  Pituitary microadenoma: Follow-up with neurosurgery as an  outpatient.  BPH: Continue Flomax and finasteride continue Foley.  Diabetes mellitus type 2 with hyperglycemia: With a last A1c of 5.4.  Tolerating his diet Continue sliding scale insulin.  Morbid obesity: Noted.   DVT prophylaxis: lovenox Family Communication:none Status is: Inpatient Remains inpatient appropriate because: Newly diagnosed high-grade lymphoma starting chemotherapy as an inpatient.    Code Status:     Code Status Orders  (From admission, onward)           Start     Ordered   03/24/2022 1701  Full code  Continuous        04/01/2022 1703           Code Status History     Date Active Date Inactive Code Status Order ID Comments User Context   04/04/2022 1429 04/03/2022 1531 Full Code [id number]  Flora Lipps Inpatient   03/25/2022 1919 04/04/2022 1424 Full Code [id number]  Elodia Florence., MD ED   03/16/2022 1557 03/18/2022 2147 Full Code [id number]  Domenic Polite, MD Inpatient   11/20/2021 2246 11/23/2021 2005 Full Code [id number]  Florencia Reasons, MD ED   10/31/2021 1747 11/01/2021 2024 Full Code [id number]  Rod Can, MD Inpatient   04/25/2021 1300 04/25/2021 2113 Full Code [id number]  [name], MD Inpatient   12/04/2020 1205 12/04/2020 2214 Full Code [id number]  [name], MD Inpatient   08/24/2020 1328 08/24/2020 2238 Full Code [id number]  [name], MD Inpatient   03/09/2017 1810 03/11/2017 1707 Full Code [id number]  Rod Can, MD Inpatient         IV Access:   Peripheral IV   Procedures and diagnostic studies:   No results found.   Medical Consultants:   None.   Subjective:    Austin Oliver has no new complaints.  Objective:    Vitals:   04/18/22 1357 04/18/22 2122 04/19/22 0511 04/19/22 0754  BP: (!) 103/58 96/68 110/62   Pulse: 77 92 85   Resp: '16 16 14   '$ Temp: 97.7 F (36.5 C) (!) 97.5 F (36.4 C) 98 F (36.7 C)   TempSrc: Oral Oral Oral   SpO2: 92% 95% 96% 96%  Weight:      Height:        SpO2: 96 % O2 Flow Rate (L/min): 2 L/min   Intake/Output Summary (Last 24 hours) at 04/19/2022 0852 Last data filed at 04/19/2022 0806 Gross per 24 hour  Intake 1134.83 ml  Output 1000 ml  Net 134.83 ml    Filed Weights   03/25/2022 1744  Weight: 93 kg    Exam: General exam: In no acute distress. Respiratory system: Good air movement and clear to auscultation. Cardiovascular system: S1 & S2 heard, RRR. No JVD. Gastrointestinal system: Abdomen is nondistended, soft and nontender.  Extremities: No pedal edema. Skin: No rashes, lesions or ulcers Psychiatry: Judgement and insight appear normal. Mood & affect appropriate.  Data Reviewed:    Labs: Basic Metabolic Panel: Recent Labs  Lab 04/14/22 0609 03/25/2022 1814 04/16/22 0600 04/18/22 0743 04/19/22 0500  NA 132* 136 136 133* 133*  K 4.4 5.2* 5.3* 5.5* 5.0  CL 97* 99 101 99 102  CO2 '23 26 24 25 25  '$ GLUCOSE 190* 160* 166* 224* 291*  BUN 30* 43* 44* 62* 59*  CREATININE 1.15 1.02 0.99 1.11 0.96  CALCIUM 9.2 9.6 9.5 9.0 8.3*  PHOS  --   --   --  5.2* 4.0    GFR Estimated Creatinine Clearance: 84.1 mL/min (by C-G formula based on SCr of 0.96 mg/dL). Liver Function Tests: Recent Labs  Lab 04/14/22 0609 04/01/2022 1814 04/16/22 0600 04/18/22 0743 04/19/22 0500  AST 31 31 34 52* 70*  ALT '29 27 27 30 '$ 37  ALKPHOS 113 109 102 99 113  BILITOT 2.0* 2.7* 2.8* 5.2* 4.4*  PROT 5.4* 5.9* 5.5* 5.2* 4.9*  ALBUMIN 1.9* 2.2* 2.2* 1.7* 1.6*    No results for input(s): LIPASE, AMYLASE in the last 168 hours. No results for input(s): AMMONIA in the last 168 hours. Coagulation profile No results for input(s): INR, PROTIME in the last 168 hours. COVID-19 Labs  Recent Labs    04/18/22 0743 04/19/22 0500  LDH 1,036* 1,352*     Lab Results  Component Value Date   SARSCOV2NAA NEGATIVE 03/25/2022   SARSCOV2NAA NEGATIVE 03/16/2022   SARSCOV2NAA POSITIVE (A) 11/20/2021   SARSCOV2NAA RESULT: NEGATIVE 10/29/2021     CBC: Recent Labs  Lab 04/14/22 0609 04/03/2022 1814 04/16/22 0600 04/18/22 0743 04/19/22 0500  WBC 4.2 3.8* 4.2 4.4 9.4  NEUTROABS  --  3.0  --  3.6 8.6*  HGB 9.7* 10.2* 10.0* 9.9* 9.3*  HCT 30.2* 31.5* 30.4* 30.6* 28.8*  MCV 88.8 89.2 89.4 87.4 87.0  PLT 156 157 134* 106* 93*    Cardiac Enzymes: No results for input(s): CKTOTAL, CKMB, CKMBINDEX, TROPONINI in the last 168 hours. BNP (last 3 results) No results for input(s): PROBNP in the last 8760 hours. CBG: Recent Labs  Lab 04/18/22 0731 04/18/22 1205 04/18/22 1710 04/18/22 2125 04/19/22 0729  GLUCAP 222* 288* 230* 246* 261*    D-Dimer: No results for input(s): DDIMER in the last 72 hours. Hgb A1c: No results for input(s): HGBA1C in the last 72 hours. Lipid Profile: No results for input(s): CHOL, HDL, LDLCALC, TRIG, CHOLHDL, LDLDIRECT  in the last 72 hours. Thyroid function studies: No results for input(s): TSH, T4TOTAL, T3FREE, THYROIDAB in the last 72 hours.  Invalid input(s): FREET3 Anemia work up: No results for input(s): VITAMINB12, FOLATE, FERRITIN, TIBC, IRON, RETICCTPCT in the last 72 hours. Sepsis Labs: Recent Labs  Lab 04/12/2022 1814 04/16/22 0600 04/18/22 0743 04/19/22 0500  WBC 3.8* 4.2 4.4 9.4    Microbiology Recent Results (from the past 240 hour(s))  Urine Culture     Status: Abnormal   Collection Time: 04/09/22 11:55 AM   Specimen: Urine, Catheterized  Result Value Ref Range Status   Specimen Description URINE, CATHETERIZED  Final   Special Requests   Final    NONE Performed at [facility], [address], [place], [place] [address]    Culture 40,000 COLONIES/mL STAPHYLOCOCCUS EPIDERMIDIS (A)  Final   Report Status 04/11/2022 FINAL  Final   Organism ID, Bacteria STAPHYLOCOCCUS EPIDERMIDIS (A)  Final      Susceptibility   Staphylococcus epidermidis - MIC*    CIPROFLOXACIN <=0.5 SENSITIVE Sensitive     GENTAMICIN <=0.5 SENSITIVE Sensitive     NITROFURANTOIN <=16 SENSITIVE  Sensitive     OXACILLIN >=4 RESISTANT Resistant     TETRACYCLINE 2 SENSITIVE Sensitive     VANCOMYCIN 2 SENSITIVE Sensitive     TRIMETH/SULFA 20 SENSITIVE Sensitive     CLINDAMYCIN 4 RESISTANT Resistant     RIFAMPIN <=0.5 SENSITIVE Sensitive     Inducible Clindamycin NEGATIVE Sensitive     * 40,000 COLONIES/mL STAPHYLOCOCCUS EPIDERMIDIS     Medications:    allopurinol  100 mg Oral BID   aspirin EC  81 mg Oral QPM   Chlorhexidine Gluconate Cloth  6 each Topical Daily   clopidogrel  75 mg Oral Daily   docusate sodium  100 mg Oral BID   docusate sodium  1 enema Rectal Daily   enoxaparin (LOVENOX) injection  40 mg Subcutaneous Q24H   ezetimibe  10 mg Oral q AM   feeding supplement  1 Container Oral TID BM   finasteride  5 mg Oral QHS   gabapentin  200 mg Oral QHS   insulin aspart  0-5 Units Subcutaneous QHS   insulin aspart  0-9 Units Subcutaneous TID WC   insulin aspart  3 Units Subcutaneous TID WC   levothyroxine  112 mcg Oral QAC breakfast   multivitamin with minerals  1 tablet Oral Daily   pantoprazole  40 mg Oral QPC supper   polyethylene glycol  17 g Oral Daily   predniSONE  60 mg Oral Q breakfast   rosuvastatin  5 mg Oral Daily   sodium chloride flush  10-40 mL Intracatheter Q12H   tamsulosin  0.8 mg Oral Q supper   Tbo-filgastrim (GRANIX) SQ  480 mcg Subcutaneous Daily   umeclidinium bromide  1 puff Inhalation Daily   vitamin B-12  1,000 mcg Oral q AM   Continuous Infusions:  sodium chloride 50 mL/hr at 04/19/22 0400      LOS: 4 days   Charlynne Cousins  [facility]  04/19/2022, 8:52 AM

## 2022-04-20 DIAGNOSIS — E875 Hyperkalemia: Secondary | ICD-10-CM

## 2022-04-20 LAB — GLUCOSE, CAPILLARY
Glucose-Capillary: 250 mg/dL — ABNORMAL HIGH (ref 70–99)
Glucose-Capillary: 313 mg/dL — ABNORMAL HIGH (ref 70–99)
Glucose-Capillary: 354 mg/dL — ABNORMAL HIGH (ref 70–99)
Glucose-Capillary: 327 mg/dL — ABNORMAL HIGH (ref 70–99)

## 2022-04-20 LAB — CBC WITH DIFFERENTIAL/PLATELET
Abs Immature Granulocytes: 1.37 10*3/uL — ABNORMAL HIGH (ref 0.00–0.07)
Basophils Absolute: 0 10*3/uL (ref 0.0–0.1)
Basophils Relative: 0 %
Eosinophils Absolute: 0 10*3/uL (ref 0.0–0.5)
Eosinophils Relative: 0 %
HCT: 28.2 % — ABNORMAL LOW (ref 39.0–52.0)
Hemoglobin: 9.1 g/dL — ABNORMAL LOW (ref 13.0–17.0)
Immature Granulocytes: 9 %
Lymphocytes Relative: 2 %
Lymphs Abs: 0.3 10*3/uL — ABNORMAL LOW (ref 0.7–4.0)
MCH: 28.2 pg (ref 26.0–34.0)
MCHC: 32.3 g/dL (ref 30.0–36.0)
MCV: 87.3 fL (ref 80.0–100.0)
Monocytes Absolute: 0.2 10*3/uL (ref 0.1–1.0)
Monocytes Relative: 1 %
Neutro Abs: 13.2 10*3/uL — ABNORMAL HIGH (ref 1.7–7.7)
Neutrophils Relative %: 88 %
Platelets: 97 10*3/uL — ABNORMAL LOW (ref 150–400)
RBC: 3.23 MIL/uL — ABNORMAL LOW (ref 4.22–5.81)
RDW: 19.7 % — ABNORMAL HIGH (ref 11.5–15.5)
WBC: 15.1 10*3/uL — ABNORMAL HIGH (ref 4.0–10.5)
nRBC: 0 % (ref 0.0–0.2)

## 2022-04-20 LAB — COMPREHENSIVE METABOLIC PANEL
ALT: 44 U/L (ref 0–44)
AST: 40 U/L (ref 15–41)
Albumin: 1.7 g/dL — ABNORMAL LOW (ref 3.5–5.0)
Alkaline Phosphatase: 146 U/L — ABNORMAL HIGH (ref 38–126)
Anion gap: 6 (ref 5–15)
BUN: 61 mg/dL — ABNORMAL HIGH (ref 8–23)
CO2: 25 mmol/L (ref 22–32)
Calcium: 8.2 mg/dL — ABNORMAL LOW (ref 8.9–10.3)
Chloride: 104 mmol/L (ref 98–111)
Creatinine, Ser: 0.92 mg/dL (ref 0.61–1.24)
GFR, Estimated: >60 mL/min (ref >60)
Glucose, Bld: 296 mg/dL — ABNORMAL HIGH (ref 70–99)
Potassium: 5.2 mmol/L — ABNORMAL HIGH (ref 3.5–5.1)
Sodium: 135 mmol/L (ref 135–145)
Total Bilirubin: 3.9 mg/dL — ABNORMAL HIGH (ref 0.3–1.2)
Total Protein: 5 g/dL — ABNORMAL LOW (ref 6.5–8.1)

## 2022-04-20 LAB — PHOSPHORUS: Phosphorus: 3.5 mg/dL (ref 2.5–4.6)

## 2022-04-20 LAB — LACTATE DEHYDROGENASE: LDH: 646 U/L — ABNORMAL HIGH (ref 98–192)

## 2022-04-20 LAB — URIC ACID: Uric Acid, Serum: 4.6 mg/dL (ref 3.7–8.6)

## 2022-04-20 MED ORDER — SODIUM POLYSTYRENE SULFONATE 15 GM/60ML PO SUSP
15.0000 g | Freq: Two times a day (BID) | ORAL | Status: AC
  Administered 2022-04-20 – 2022-04-21 (×4): 15 g via ORAL
  Filled 2022-04-20 (×5): qty 60

## 2022-04-20 NOTE — Progress Notes (Signed)
[facility] PROGRESS NOTE    Progress Note  Austin Oliver  HDQ:[id number] DOB: [birth year] DOA: 04/10/2022 PCP: [name], MD     Brief Narrative:   Austin Oliver is an 66 y.o. 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  Oncology was consulted and recommended moved to [facility] for urgent chemotherapy.  Port-A-Cath was placed on 04/17/2022. Oncology to start chemotherapy 6.1.2023, cycle every 21 days of prednisone rituximab cyclophosphamide, doxorubicin and vincristine.    Assessment/Plan:   High-grade B-cell lymphoma: Admitted to inpatient. Oncology has been consulted. Was premedicated before chemotherapy seems to tolerated it well. LDH of 1300. Received R-CHOP on 04/17/2022 and tolerated rituximab infusion. Has been premedicated for rituximab. Continue Granix subcutaneously. Daily labs monitor for tumor lysis syndrome. Physical therapy evaluated the patient will need to go back to inpatient rehab.  History of recurrent CVA and history of spinal cord infarction [facility]): In by neurologist on last admission concerned about hypercoagulable state due to his spinal cord infarct on the previous admission. Continue aspirin and Plavix. Post Port-A-Cath placement on 04/16/2022 Continue Crestor and Zetia.  Electrolytes imbalance hyperkalemia hyperphosphatemia Resolving with IV fluids continue IV fluids for an additional 24 hours. Potassium is up again start Kayexalate twice a day continue to monitor  electrolytes.  Essential hypertension: Blood pressure soft continue to hold home regimen.  COPD: Resume home regimen.  Sick sinus syndrome: Continue current home regimen.    CAD/hyperlipidemia: Continue Crestor and statins.  Pituitary microadenoma: Follow-up with neurosurgery as an outpatient.  BPH: Continue Flomax and finasteride continue Foley.  Diabetes mellitus type 2 with hyperglycemia: With a last A1c of 5.4.  Tolerating his diet Continue sliding scale insulin.  Morbid obesity: Noted.   DVT prophylaxis: lovenox Family Communication:none Status is: Inpatient Remains inpatient appropriate because: Newly diagnosed high-grade lymphoma starting chemotherapy as an inpatient.    Code Status:     Code Status Orders  (From admission, onward)           Start     Ordered   04/16/2022 1701  Full code  Continuous        04/12/2022 1703           Code Status History     Date Active Date Inactive Code Status Order ID Comments User Context   04/04/2022 1429 04/08/2022 1531 Full Code [id number]  Flora Lipps Inpatient   03/25/2022 1919 04/04/2022 1424 Full Code [id number]  Elodia Florence., MD ED   03/16/2022 1557 03/18/2022 2147 Full Code [id number]  Domenic Polite, MD Inpatient   11/20/2021 2246 11/23/2021 2005 Full Code [id number]  Florencia Reasons, MD ED   10/31/2021 1747 11/01/2021 2024 Full Code [id number]  Rod Can, MD Inpatient   04/25/2021 1300 04/25/2021 2113 Full Code [id number]  [name], MD Inpatient   12/04/2020 1205 12/04/2020 2214 Full Code [id number]  [name], MD Inpatient   08/24/2020 1328 08/24/2020 2238 Full Code [id number]  [name], MD Inpatient   03/09/2017 1810 03/11/2017 1707 Full Code [id number]  Rod Can, MD Inpatient  IV Access:   Peripheral IV   Procedures and diagnostic studies:   No results found.   Medical Consultants:   None.   Subjective:    Austin Oliver no new complaints has  tolerated his diet.  Objective:    Vitals:   04/19/22 1344 04/19/22 2142 04/20/22 0546 04/20/22 0753  BP: 108/67 115/64 (!) 109/98   Pulse: 93 96 94   Resp:  16 16   Temp: 97.6 F (36.4 C) 97.7 F (36.5 C) 98.2 F (36.8 C)   TempSrc: Oral Oral Oral   SpO2: 97% 95% 92% 94%  Weight:      Height:       SpO2: 94 % O2 Flow Rate (L/min): 2 L/min   Intake/Output Summary (Last 24 hours) at 04/20/2022 0836 Last data filed at 04/20/2022 0547 Gross per 24 hour  Intake 2052.16 ml  Output 1400 ml  Net 652.16 ml    Filed Weights   03/17/2022 1744  Weight: 93 kg    Exam: General exam: In no acute distress. Respiratory system: Good air movement and clear to auscultation. Cardiovascular system: S1 & S2 heard, RRR. No JVD. Gastrointestinal system: Abdomen is nondistended, soft and nontender.  Extremities: No pedal edema. Skin: No rashes, lesions or ulcers Psychiatry: Judgement and insight appear normal. Mood & affect appropriate. Data Reviewed:    Labs: Basic Metabolic Panel: Recent Labs  Lab 03/18/2022 1814 04/16/22 0600 04/18/22 0743 04/19/22 0500 04/20/22 0550  NA 136 136 133* 133* 135  K 5.2* 5.3* 5.5* 5.0 5.2*  CL 99 101 99 102 104  CO2 '26 24 25 25 25  '$ GLUCOSE 160* 166* 224* 291* 296*  BUN 43* 44* 62* 59* 61*  CREATININE 1.02 0.99 1.11 0.96 0.92  CALCIUM 9.6 9.5 9.0 8.3* 8.2*  PHOS  --   --  5.2* 4.0 3.5    GFR Estimated Creatinine Clearance: 87.7 mL/min (by C-G formula based on SCr of 0.92 mg/dL). Liver Function Tests: Recent Labs  Lab 03/20/2022 1814 04/16/22 0600 04/18/22 0743 04/19/22 0500 04/20/22 0550  AST 31 34 52* 70* 40  ALT '27 27 30 '$ 37 44  ALKPHOS 109 102 99 113 146*  BILITOT 2.7* 2.8* 5.2* 4.4* 3.9*  PROT 5.9* 5.5* 5.2* 4.9* 5.0*  ALBUMIN 2.2* 2.2* 1.7* 1.6* 1.7*    No results for input(s): LIPASE, AMYLASE in the last 168 hours. No results for input(s): AMMONIA in the last 168 hours. Coagulation profile No results for input(s): INR, PROTIME  in the last 168 hours. COVID-19 Labs  Recent Labs    04/18/22 0743 04/19/22 0500 04/20/22 0550  LDH 1,036* 1,352* 646*     Lab Results  Component Value Date   SARSCOV2NAA NEGATIVE 03/25/2022   SARSCOV2NAA NEGATIVE 03/16/2022   SARSCOV2NAA POSITIVE (A) 11/20/2021   SARSCOV2NAA RESULT: NEGATIVE 10/29/2021    CBC: Recent Labs  Lab 03/29/2022 1814 04/16/22 0600 04/18/22 0743 04/19/22 0500 04/20/22 0550  WBC 3.8* 4.2 4.4 9.4 15.1*  NEUTROABS 3.0  --  3.6 8.6* 13.2*  HGB 10.2* 10.0* 9.9* 9.3* 9.1*  HCT 31.5* 30.4* 30.6* 28.8* 28.2*  MCV 89.2 89.4 87.4 87.0 87.3  PLT 157 134* 106* 93* 97*    Cardiac Enzymes: No results for input(s): CKTOTAL, CKMB, CKMBINDEX, TROPONINI in the last 168 hours. BNP (last 3 results) No results for input(s): PROBNP in the last 8760 hours. CBG: Recent Labs  Lab 04/19/22 0729 04/19/22 1137 04/19/22 1613 04/19/22 2053 04/20/22 0741  GLUCAP 261* 274* 229* 260* 250*  D-Dimer: No results for input(s): DDIMER in the last 72 hours. Hgb A1c: No results for input(s): HGBA1C in the last 72 hours. Lipid Profile: No results for input(s): CHOL, HDL, LDLCALC, TRIG, CHOLHDL, LDLDIRECT in the last 72 hours. Thyroid function studies: No results for input(s): TSH, T4TOTAL, T3FREE, THYROIDAB in the last 72 hours.  Invalid input(s): FREET3 Anemia work up: No results for input(s): VITAMINB12, FOLATE, FERRITIN, TIBC, IRON, RETICCTPCT in the last 72 hours. Sepsis Labs: Recent Labs  Lab 04/16/22 0600 04/18/22 0743 04/19/22 0500 04/20/22 0550  WBC 4.2 4.4 9.4 15.1*    Microbiology No results found for this or any previous visit (from the past 240 hour(s)).    Medications:    allopurinol  100 mg Oral BID   aspirin EC  81 mg Oral QPM   Chlorhexidine Gluconate Cloth  6 each Topical Daily   clopidogrel  75 mg Oral Daily   docusate sodium  100 mg Oral BID   docusate sodium  1 enema Rectal Daily   enoxaparin (LOVENOX) injection  40 mg  Subcutaneous Q24H   ezetimibe  10 mg Oral q AM   feeding supplement  1 Container Oral TID BM   finasteride  5 mg Oral QHS   gabapentin  200 mg Oral QHS   insulin aspart  0-5 Units Subcutaneous QHS   insulin aspart  0-9 Units Subcutaneous TID WC   insulin aspart  3 Units Subcutaneous TID WC   levothyroxine  112 mcg Oral QAC breakfast   multivitamin with minerals  1 tablet Oral Daily   pantoprazole  40 mg Oral QPC supper   polyethylene glycol  17 g Oral Daily   predniSONE  60 mg Oral Q breakfast   rosuvastatin  5 mg Oral Daily   sodium chloride flush  10-40 mL Intracatheter Q12H   tamsulosin  0.8 mg Oral Q supper   Tbo-filgastrim (GRANIX) SQ  480 mcg Subcutaneous Daily   umeclidinium bromide  1 puff Inhalation Daily   vitamin B-12  1,000 mcg Oral q AM   Continuous Infusions:  sodium chloride 75 mL/hr at 04/19/22 1911      LOS: 5 days   Charlynne Cousins  [facility]  04/20/2022, 8:36 AM

## 2022-04-21 ENCOUNTER — Inpatient Hospital Stay (HOSPITAL_COMMUNITY): Payer: Medicare Other

## 2022-04-21 LAB — CBC WITH DIFFERENTIAL/PLATELET
Abs Immature Granulocytes: 3.34 10*3/uL — ABNORMAL HIGH (ref 0.00–0.07)
Basophils Absolute: 0.1 10*3/uL (ref 0.0–0.1)
Basophils Relative: 1 %
Eosinophils Absolute: 0 10*3/uL (ref 0.0–0.5)
Eosinophils Relative: 0 %
HCT: 25.9 % — ABNORMAL LOW (ref 39.0–52.0)
Hemoglobin: 8.5 g/dL — ABNORMAL LOW (ref 13.0–17.0)
Immature Granulocytes: 28 %
Lymphocytes Relative: 3 %
Lymphs Abs: 0.4 10*3/uL — ABNORMAL LOW (ref 0.7–4.0)
MCH: 28.7 pg (ref 26.0–34.0)
MCHC: 32.8 g/dL (ref 30.0–36.0)
MCV: 87.5 fL (ref 80.0–100.0)
Monocytes Absolute: 0.1 10*3/uL (ref 0.1–1.0)
Monocytes Relative: 1 %
Neutro Abs: 7.9 10*3/uL — ABNORMAL HIGH (ref 1.7–7.7)
Neutrophils Relative %: 67 %
Platelets: 88 10*3/uL — ABNORMAL LOW (ref 150–400)
RBC: 2.96 MIL/uL — ABNORMAL LOW (ref 4.22–5.81)
RDW: 19.3 % — ABNORMAL HIGH (ref 11.5–15.5)
WBC: 11.8 10*3/uL — ABNORMAL HIGH (ref 4.0–10.5)
nRBC: 0 % (ref 0.0–0.2)

## 2022-04-21 LAB — COMPREHENSIVE METABOLIC PANEL
ALT: 103 U/L — ABNORMAL HIGH (ref 0–44)
AST: 119 U/L — ABNORMAL HIGH (ref 15–41)
Albumin: 1.7 g/dL — ABNORMAL LOW (ref 3.5–5.0)
Alkaline Phosphatase: 199 U/L — ABNORMAL HIGH (ref 38–126)
Anion gap: 8 (ref 5–15)
BUN: 61 mg/dL — ABNORMAL HIGH (ref 8–23)
CO2: 24 mmol/L (ref 22–32)
Calcium: 8 mg/dL — ABNORMAL LOW (ref 8.9–10.3)
Chloride: 103 mmol/L (ref 98–111)
Creatinine, Ser: 0.84 mg/dL (ref 0.61–1.24)
GFR, Estimated: >60 mL/min (ref >60)
Glucose, Bld: 237 mg/dL — ABNORMAL HIGH (ref 70–99)
Potassium: 4.9 mmol/L (ref 3.5–5.1)
Sodium: 135 mmol/L (ref 135–145)
Total Bilirubin: 4.1 mg/dL — ABNORMAL HIGH (ref 0.3–1.2)
Total Protein: 4.7 g/dL — ABNORMAL LOW (ref 6.5–8.1)

## 2022-04-21 LAB — PHOSPHORUS: Phosphorus: 3.4 mg/dL (ref 2.5–4.6)

## 2022-04-21 LAB — GLUCOSE, CAPILLARY
Glucose-Capillary: 207 mg/dL — ABNORMAL HIGH (ref 70–99)
Glucose-Capillary: 237 mg/dL — ABNORMAL HIGH (ref 70–99)
Glucose-Capillary: 212 mg/dL — ABNORMAL HIGH (ref 70–99)
Glucose-Capillary: 209 mg/dL — ABNORMAL HIGH (ref 70–99)

## 2022-04-21 LAB — LACTATE DEHYDROGENASE: LDH: 463 U/L — ABNORMAL HIGH (ref 98–192)

## 2022-04-21 LAB — URIC ACID: Uric Acid, Serum: 4.3 mg/dL (ref 3.7–8.6)

## 2022-04-21 MED ORDER — PIPERACILLIN-TAZOBACTAM 3.375 G IVPB
3.3750 g | Freq: Three times a day (TID) | INTRAVENOUS | Status: DC
  Administered 2022-04-21 – 2022-04-22 (×3): 3.375 g via INTRAVENOUS
  Filled 2022-04-21 (×3): qty 50

## 2022-04-21 MED ORDER — INSULIN DETEMIR 100 UNIT/ML ~~LOC~~ SOLN
20.0000 [IU] | Freq: Two times a day (BID) | SUBCUTANEOUS | Status: DC
  Filled 2022-04-21: qty 0.2

## 2022-04-21 MED ORDER — MORPHINE SULFATE (PF) 4 MG/ML IV SOLN
4.0000 mg | INTRAVENOUS | Status: DC | PRN
  Administered 2022-04-21 – 2022-04-24 (×6): 4 mg via INTRAVENOUS
  Filled 2022-04-21 (×7): qty 1

## 2022-04-21 MED ORDER — FUROSEMIDE 10 MG/ML IJ SOLN
20.0000 mg | Freq: Two times a day (BID) | INTRAMUSCULAR | Status: DC
  Administered 2022-04-21 – 2022-04-23 (×6): 20 mg via INTRAVENOUS
  Filled 2022-04-21 (×6): qty 2

## 2022-04-21 MED ORDER — INSULIN DETEMIR 100 UNIT/ML ~~LOC~~ SOLN
10.0000 [IU] | Freq: Every day | SUBCUTANEOUS | Status: DC
  Administered 2022-04-21 – 2022-04-24 (×4): 10 [IU] via SUBCUTANEOUS
  Filled 2022-04-21 (×5): qty 0.1

## 2022-04-21 NOTE — Progress Notes (Signed)
Brief Hematology/Oncology Progress Note  Clinical Summary: [name] is a 66 year old male with diagnosis of advanced stage diffuse large B-cell lymphoma currently admitted for R-CHOP chemotherapy.  Interval History: --More alert this morning, reporting right upper quadrant abdominal pain --Denies nausea and vomiting, losing stool --Patient has poor appetite but is eating and drinking fluids --Denies any fevers, chills, sweats.  O:  Vitals:   04/21/22 0458 04/21/22 0819  BP: 98/66   Pulse: 86   Resp: 16   Temp: 98.5 F (36.9 C)   SpO2: 95% 97%      Latest Ref Rng & Units 04/21/2022    6:05 AM 04/20/2022    5:50 AM 04/19/2022    5:00 AM  CMP  Glucose 70 - 99 mg/dL 237   296   291    BUN 8 - 23 mg/dL 61   61   59    Creatinine 0.61 - 1.24 mg/dL 0.84   0.92   0.96    Sodium 135 - 145 mmol/L 135   135   133    Potassium 3.5 - 5.1 mmol/L 4.9   5.2   5.0    Chloride 98 - 111 mmol/L 103   104   102    CO2 22 - 32 mmol/L 24   25   25     Calcium 8.9 - 10.3 mg/dL 8.0   8.2   8.3    Total Protein 6.5 - 8.1 g/dL 4.7   5.0   4.9    Total Bilirubin 0.3 - 1.2 mg/dL 4.1   3.9   4.4    Alkaline Phos 38 - 126 U/L 199   146   113    AST 15 - 41 U/L 119   40   70    ALT 0 - 44 U/L 103   44   37        Latest Ref Rng & Units 04/21/2022    6:05 AM 04/20/2022    5:50 AM 04/19/2022    5:00 AM  CBC  WBC 4.0 - 10.5 K/uL 11.8   15.1   9.4    Hemoglobin 13.0 - 17.0 g/dL 8.5   9.1   9.3    Hematocrit 39.0 - 52.0 % 25.9   28.2   28.8    Platelets 150 - 400 K/uL 88   97   93        GENERAL: Chronically ill-appearing elderly Caucasian male in NAD  SKIN: skin color, texture, turgor are normal, no rashes or significant lesions EYES: conjunctiva are pink and non-injected, sclera clear LUNGS: clear to auscultation and percussion with normal breathing effort HEART: regular rate & rhythm and no murmurs and no lower extremity edema ABD: Right upper quadrant abdominal pain with palpation, abdomen  soft PSYCH: alert & oriented x 3, fluent speech NEURO: no focal motor/sensory deficits  Assessment/Plan:  This is a 66 year old male recently diagnosed with diffuse large B cell non-Hodgkin's lymphoma involving abdominal lymphadenopathy in addition to liver lesions suspicious for bone metastasis.  His overall performance status has been declining recently.  He was participating with inpatient rehab at [facility] and then transferred to [facility] for chemotherapy administration.  He received his first cycle of R-CHOP beginning on 04/17/2022.  He developed abdominal pain during the Rituxan infusion and this was discontinued.  He was rechallenged again on 04/18/2022 with premedications including Tylenol, Benadryl, Solu-Medrol, Pepcid and the rate was kept at 50 mg/h and he tolerated  well.  He is at high risk for tumor lysis syndrome and labs are being monitored closely.  He remains on allopurinol.  He is not eating or drinking very well.  Reports continued decreased strength of his lower extremities.  He was evaluated by radiation oncology and there are no plans for radiation.  It was felt that his symptoms may improve with chemotherapy alone.  #Diffuse large B cell non-Hodgkin's lymphoma --He has abdominal lymphadenopathy, liver lesions, and suspected bone metastasis. --Biopsy of one of his liver lesions performed on 04/07/2022 consistent with high-grade B-cell lymphoma. --Echocardiogram performed on 04/01/2022 showed LVEF of 55 to 60%. --Hepatitis serologies negative. --He has been started on allopurinol due to risk for tumor lysis syndrome. --Received his first cycle of R-CHOP on 04/17/2022.  Had a reaction to Rituxan consisting of abdominal pain.  Rituxan infusion was not completed on day 1 due to infusion reaction, was re-attempted and successfully completed on day 2.  --Recommend for future doses of rituximab to run slowly and premedicate with Tylenol, Benadryl, Solu-Medrol, Pepcid. Plan: --today is  Cycle 1 Day 5 -- Granix 480 mcg subcu daily started yesterday, continue x5 days. --Continue to monitor daily labs.  Labs today show a uric acid level of 4.3, WBC 11.8, hemoglobin 8.5, platelets 88,000, ANC 7.9, calcium 8.0, albumin 1.7, AST 119, ALT 103, alk phos 199, T. bili 4.1, phosphorus 3.4, LDH 463.  #Anemia and thrombocytopenia --The patient has baseline anemia and thrombocytopenia. --Hemoglobin and platelets are downtrending likely due to recent chemotherapy. --We will continue to monitor.  Transfusion not indicated at this time.  #Right upper quadrant abdominal pain/transaminitis --Unclear etiology but right upper quadrant abdominal pain seems worse today --Noted to have elevated LFTs which could be related to chemotherapy -- White blood cell count elevated but difficult to interpret in the setting of Granix and prednisone -- Afebrile --Additional work-up in process per hospitalist including blood cultures and abdominal ultrasound  #History of spinal cord infarct --Continues to have lower extremity weakness. --PT eval requested.  Will need rehab following hospital discharge.  #Constipation -- Slowly oozing stool --On Colace and MiraLAX  [employer]

## 2022-04-21 NOTE — Progress Notes (Signed)
Pharmacy Antibiotic Note  Austin Oliver is a 66 y.o. male admitted on 03/19/2022 for new start chemo for high-grade B-cell lymphoma .  Pharmacy has been consulted for zosyn dosing for r/o IAI  Plan: Zosyn 3.375g IV q8h (4 hour infusion). Pharmacy to sign off  Height: '5\' 9"'$  (175.3 cm) Weight: 93 kg (205 lb 0.4 oz) IBW/kg (Calculated) : 70.7  Temp (24hrs), Avg:98.4 F (36.9 C), Min:98.2 F (36.8 C), Max:98.5 F (36.9 C)  Recent Labs  Lab 04/16/22 0600 04/18/22 0743 04/19/22 0500 04/20/22 0550 04/21/22 0605  WBC 4.2 4.4 9.4 15.1* 11.8*  CREATININE 0.99 1.11 0.96 0.92 0.84    Estimated Creatinine Clearance: 96.1 mL/min (by C-G formula based on SCr of 0.84 mg/dL).    Allergies  Allergen Reactions   Bee Venom Swelling and Other (See Comments)    Extreme Swelling of site     Cleocin [Clindamycin Hcl] Rash and Other (See Comments)    RASH IN BETWEEN FINGERS   Statins Other (See Comments)    Muscles aches with several statins     Thank you for allowing pharmacy to be a part of this patient's care.  Eudelia Bunch, Pharm.D 04/21/2022 8:27 AM

## 2022-04-21 NOTE — Progress Notes (Signed)
Inpatient Rehabilitation Admissions Coordinator   I have left his wife a second voicemail to contact me to discuss his rehab options.  Danne Baxter, RN, MSN Rehab Admissions Coordinator [phone] 04/21/2022 8:35 AM

## 2022-04-21 NOTE — Progress Notes (Signed)
Occupational Therapy Treatment Patient Details Name: Austin Oliver MRN: [id number] DOB: 12-10-1955 Today's Date: 04/21/2022   History of present illness 66 y.o. 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 Pt seen in [facility]  ED 03/25/22 for left lower extremity weakness and difficulty ambulating, was found to have spinal cord infarction and also noticed widespread metastatic lymphadenopathy the liver biopsy was done which showed high-grade B-cell lymphoma. Pt transferred from inpatient rehab to [facility] for chemotherapy.   OT comments  Patient was noted to be able to participate in grooming tasks in chair position in bed with reduced assistance compared to evaluation. Patient was educated on rolling with hips and shoulders at the same time with max A to complete task with BUE on bed rails. Patient's discharge plan remains appropriate at this time. OT will continue to follow acutely.      Recommendations for follow up therapy are one component of a multi-disciplinary discharge planning process, led by the attending physician.  Recommendations may be updated based on patient status, additional functional criteria and insurance authorization.    Follow Up Recommendations  Acute inpatient rehab (3hours/day)    Assistance Recommended at Discharge Frequent or constant Supervision/Assistance  Patient can return home with the following  Two people to help with walking and/or transfers;Assist for transportation;Help with stairs or ramp for entrance;Two people to help with bathing/dressing/bathroom   Equipment Recommendations  Other (comment) (defer to next venue)    Recommendations for Other Services      Precautions / Restrictions Precautions Precautions: Fall Precaution Comments: new lymphoma dx with spinal mets, BLE weakness Restrictions Weight Bearing  Restrictions: No       Mobility Bed Mobility Overal bed mobility: Needs Assistance   Rolling: Max assist              Transfers                         Balance                                           ADL either performed or assessed with clinical judgement   ADL Overall ADL's : Needs assistance/impaired     Grooming: Brushing hair;Oral care;Wash/dry face;Bed level;Set up;Min guard Grooming Details (indicate cue type and reason): sitting in chair position in bed with set up for task                               General ADL Comments: patient was educated on proper positioning in bed with max A to roll to each side in bed with shoulders and hips movign at same time. patient was posiitoned in chair position to increase ability to tolerate upright posture. patient was educated on assisting nursing with UB bathing tasks each time to build functional activity tolerance. patient and wife verbalized understanding. wife was present in room for session.    Extremity/Trunk Assessment              Vision       Perception     Praxis      Cognition Arousal/Alertness: Awake/alert Behavior During Therapy: Flat affect Overall Cognitive Status: Within Functional Limits for tasks assessed  General Comments: HOH and noted to have some confusion at times but very plesant        Exercises      Shoulder Instructions       General Comments patient was educated on how to use bedraislt o lean slightly forwards. patient was unable to complete with noted pushing instead of pulling movements noted.    Pertinent Vitals/ Pain       Pain Assessment Pain Assessment: Faces Faces Pain Scale: Hurts even more Pain Location: R shoulder, chest, scrotum wtih movement Pain Descriptors / Indicators: Grimacing, Guarding Pain Intervention(s): Patient requesting pain meds-RN notified, RN gave pain meds  during session, Limited activity within patient's tolerance, Monitored during session, Repositioned  Home Living                                          Prior Functioning/Environment              Frequency  Min 2X/week        Progress Toward Goals  OT Goals(current goals can now be found in the care plan section)  Progress towards OT goals: Progressing toward goals     Plan Discharge plan remains appropriate    Co-evaluation                 AM-PAC OT "6 Clicks" Daily Activity     Outcome Measure   Help from another person eating meals?: A Lot Help from another person taking care of personal grooming?: A Little Help from another person toileting, which includes using toliet, bedpan, or urinal?: Total Help from another person bathing (including washing, rinsing, drying)?: A Lot Help from another person to put on and taking off regular upper body clothing?: A Lot Help from another person to put on and taking off regular lower body clothing?: Total 6 Click Score: 11    End of Session    OT Visit Diagnosis: Unsteadiness on feet (R26.81);Other abnormalities of gait and mobility (R26.89);Muscle weakness (generalized) (M62.81);History of falling (Z91.81);Pain;Other symptoms and signs involving the nervous system (R29.898)   Activity Tolerance Patient limited by pain   Patient Left in bed;with call bell/phone within reach;with family/visitor present   Nurse Communication Patient requests pain meds        Time: 1500-1533 OT Time Calculation (min): 33 min  Charges: OT General Charges $OT Visit: 1 Visit OT Treatments $Self Care/Home Management : 8-22 mins $Therapeutic Activity: 8-22 mins  Jackelyn Poling OTR/L, MS Acute Rehabilitation Department Office# [phone] Pager# [phone]   Marcellina Millin 04/21/2022, 4:10 PM

## 2022-04-21 NOTE — Progress Notes (Signed)
[facility] PROGRESS NOTE    Progress Note  Austin Oliver  ACZ:[id number] DOB: 23-May-1956 DOA: 03/21/2022 PCP: [name], MD     Brief Narrative:   Austin Oliver is an 66 y.o. male past medical history significant for sick sinus syndrome status post pacemaker placement, cirrhosis, recurrent CVA with residual left lower extremity and history of spinal cord infarct found to have weakness seen in [facility]  ED for left lower extremity weakness and difficulty ambulating, was found to have spinal cord infarction and also noticed widespread metastatic lymphadenopathy the liver biopsy was done which showed high-grade B-cell lymphoma he was discharged to inpatient rehab.  Oncology was consulted and recommended moved to [facility] for urgent chemotherapy.  Port-A-Cath was placed on 04/17/2022. Oncology to start chemotherapy 6.1.2023, cycle every 21 days of prednisone rituximab cyclophosphamide, doxorubicin and vincristine.   Assessment/Plan:   High-grade B-cell lymphoma: Oncology has been consulted. LDH of 1300. Received R-CHOP on 04/17/2022 and tolerated rituximab infusion. Has been premedicated for rituximab. Continue Granix subcutaneously. Daily labs monitor for tumor lysis syndrome. Further management per oncology. Physical therapy evaluated the patient will need to go back to inpatient rehab.  History of recurrent CVA and history of spinal cord infarction [facility]): Continue aspirin and Plavix. Post Port-A-Cath placement on 04/16/2022 Continue Crestor and Zetia.  Electrolytes imbalance hyperkalemia hyperphosphatemia Continue IV fluids his potassium was elevated yesterday was started on Kayexalate and now is improved likely some minor degree of tumor lysis syndrome. Continue Kayexalate twice a day for an additional day.  Essential hypertension: Blood pressure soft continue to hold home regimen.  Anasarca: He is likely third spacing swelling in his sacrum lower  extremity and scrotum. His albumin is 1.7. The setting of malignancy and chemotherapy. We will start him on low-dose IV Lasix.  New right upper quadrant abdominal pain: Diffuse, appears to be developing ascites. LFTs are slightly elevated along with alkaline phosphatase. No rebound or guarding tolerating his diet. Previous CT scan 03/29/2022 showed small gallstones in the gallbladder. Get an abdominal ultrasound rule out acute cholecystitis. Check blood cultures, started on IV Zosyn.  COPD: Resume home regimen.  Sick sinus syndrome: Continue current home regimen.    CAD/hyperlipidemia: Continue Crestor and statins.  Pituitary microadenoma: Follow-up with neurosurgery as an outpatient.  BPH: Continue Flomax and finasteride continue Foley.  Diabetes mellitus type 2 with hyperglycemia: With a last A1c of 5.4.  Tolerating his diet Blood glucose elevated in the setting of steroids. Start long-acting insulin continue sliding scale.  Morbid obesity: Noted.  Stage I sacral decubitus ulcer: Turn patient every 2 hours.   DVT prophylaxis: lovenox Family Communication:none Status is: Inpatient Remains inpatient appropriate because: Newly diagnosed high-grade lymphoma starting chemotherapy as an inpatient.    Code Status:     Code Status Orders  (From admission, onward)           Start     Ordered   04/12/2022 1701  Full code  Continuous        03/28/2022 1703           Code Status History     Date Active Date Inactive Code Status Order ID Comments User Context   04/04/2022 1429 04/05/2022 1531 Full Code [id number]  Flora Lipps Inpatient   03/25/2022 1919 04/04/2022 1424 Full Code [id number]  Elodia Florence., MD ED   03/16/2022 1557 03/18/2022 2147 Full Code [id number]  Domenic Polite, MD Inpatient   11/20/2021 2246 11/23/2021  2005 Full Code [id number]  Florencia Reasons, MD ED   10/31/2021 1747 11/01/2021 2024 Full Code [id number]  Rod Can, MD Inpatient    04/25/2021 1300 04/25/2021 2113 Full Code [id number]  [name], MD Inpatient   12/04/2020 1205 12/04/2020 2214 Full Code [id number]  [name], MD Inpatient   08/24/2020 1328 08/24/2020 2238 Full Code [id number]  [name], MD Inpatient   03/09/2017 1810 03/11/2017 1707 Full Code [id number]  Rod Can, MD Inpatient         IV Access:   Peripheral IV   Procedures and diagnostic studies:   No results found.   Medical Consultants:   None.   Subjective:    Austin Oliver complaining of right upper quadrant pain.  Objective:    Vitals:   04/20/22 0753 04/20/22 1423 04/20/22 2114 04/21/22 0458  BP:  (!) 120/59 101/63 98/66  Pulse:  (!) 107 (!) 110 86  Resp:  '20 18 16  '$ Temp:  98.5 F (36.9 C) 98.2 F (36.8 C) 98.5 F (36.9 C)  TempSrc:  Oral Oral Oral  SpO2: 94% 95% 95% 95%  Weight:      Height:       SpO2: 95 % O2 Flow Rate (L/min): 2 L/min   Intake/Output Summary (Last 24 hours) at 04/21/2022 0802 Last data filed at 04/21/2022 0459 Gross per 24 hour  Intake 1832.6 ml  Output 1500 ml  Net 332.6 ml    Filed Weights   03/31/2022 1744  Weight: 93 kg    Exam: General exam: In no acute distress. Respiratory system: Good air movement and clear to auscultation. Cardiovascular system: S1 & S2 heard, RRR. No JVD. Gastrointestinal system: Positive bowel sounds soft diffuse tenderness but mainly in the right upper quadrant Extremities: 3+ edema up to the sacrum Skin: Stage I sacral decubitus ulcer Psychiatry: Judgement and insight appear normal. Mood & affect appropriate. Data Reviewed:    Labs: Basic Metabolic Panel: Recent Labs  Lab 04/16/22 0600 04/18/22 0743 04/19/22 0500 04/20/22 0550 04/21/22 0605  NA 136 133* 133* 135 135  K 5.3* 5.5* 5.0 5.2* 4.9  CL 101 99 102 104 103  CO2 '24 25 25 25 24  '$ GLUCOSE 166* 224* 291* 296* 237*  BUN 44* 62* 59* 61* 61*  CREATININE 0.99 1.11 0.96 0.92 0.84  CALCIUM 9.5 9.0 8.3* 8.2* 8.0*  PHOS   --  5.2* 4.0 3.5 3.4    GFR Estimated Creatinine Clearance: 96.1 mL/min (by C-G formula based on SCr of 0.84 mg/dL). Liver Function Tests: Recent Labs  Lab 04/16/22 0600 04/18/22 0743 04/19/22 0500 04/20/22 0550 04/21/22 0605  AST 34 52* 70* 40 119*  ALT 27 30 37 44 103*  ALKPHOS 102 99 113 146* 199*  BILITOT 2.8* 5.2* 4.4* 3.9* 4.1*  PROT 5.5* 5.2* 4.9* 5.0* 4.7*  ALBUMIN 2.2* 1.7* 1.6* 1.7* 1.7*    No results for input(s): LIPASE, AMYLASE in the last 168 hours. No results for input(s): AMMONIA in the last 168 hours. Coagulation profile No results for input(s): INR, PROTIME in the last 168 hours. COVID-19 Labs  Recent Labs    04/19/22 0500 04/20/22 0550 04/21/22 0605  LDH 1,352* 646* 463*     Lab Results  Component Value Date   SARSCOV2NAA NEGATIVE 03/25/2022   SARSCOV2NAA NEGATIVE 03/16/2022   SARSCOV2NAA POSITIVE (A) 11/20/2021   SARSCOV2NAA RESULT: NEGATIVE 10/29/2021    CBC: Recent Labs  Lab 03/19/2022 1814 04/16/22 0600 04/18/22 0743 04/19/22 0500 04/20/22 0550 04/21/22 2119  WBC 3.8* 4.2 4.4 9.4 15.1* 11.8*  NEUTROABS 3.0  --  3.6 8.6* 13.2* 7.9*  HGB 10.2* 10.0* 9.9* 9.3* 9.1* 8.5*  HCT 31.5* 30.4* 30.6* 28.8* 28.2* 25.9*  MCV 89.2 89.4 87.4 87.0 87.3 87.5  PLT 157 134* 106* 93* 97* 88*    Cardiac Enzymes: No results for input(s): CKTOTAL, CKMB, CKMBINDEX, TROPONINI in the last 168 hours. BNP (last 3 results) No results for input(s): PROBNP in the last 8760 hours. CBG: Recent Labs  Lab 04/20/22 0741 04/20/22 1255 04/20/22 1642 04/20/22 2116 04/21/22 0714  GLUCAP 250* 313* 354* 327* 207*    D-Dimer: No results for input(s): DDIMER in the last 72 hours. Hgb A1c: No results for input(s): HGBA1C in the last 72 hours. Lipid Profile: No results for input(s): CHOL, HDL, LDLCALC, TRIG, CHOLHDL, LDLDIRECT in the last 72 hours. Thyroid function studies: No results for input(s): TSH, T4TOTAL, T3FREE, THYROIDAB in the last 72  hours.  Invalid input(s): FREET3 Anemia work up: No results for input(s): VITAMINB12, FOLATE, FERRITIN, TIBC, IRON, RETICCTPCT in the last 72 hours. Sepsis Labs: Recent Labs  Lab 04/18/22 0743 04/19/22 0500 04/20/22 0550 04/21/22 0605  WBC 4.4 9.4 15.1* 11.8*    Microbiology No results found for this or any previous visit (from the past 240 hour(s)).    Medications:    allopurinol  100 mg Oral BID   aspirin EC  81 mg Oral QPM   Chlorhexidine Gluconate Cloth  6 each Topical Daily   clopidogrel  75 mg Oral Daily   docusate sodium  100 mg Oral BID   docusate sodium  1 enema Rectal Daily   enoxaparin (LOVENOX) injection  40 mg Subcutaneous Q24H   ezetimibe  10 mg Oral q AM   feeding supplement  1 Container Oral TID BM   finasteride  5 mg Oral QHS   gabapentin  200 mg Oral QHS   insulin aspart  0-5 Units Subcutaneous QHS   insulin aspart  0-9 Units Subcutaneous TID WC   insulin aspart  3 Units Subcutaneous TID WC   levothyroxine  112 mcg Oral QAC breakfast   multivitamin with minerals  1 tablet Oral Daily   pantoprazole  40 mg Oral QPC supper   polyethylene glycol  17 g Oral Daily   predniSONE  60 mg Oral Q breakfast   rosuvastatin  5 mg Oral Daily   sodium chloride flush  10-40 mL Intracatheter Q12H   sodium polystyrene  15 g Oral BID   tamsulosin  0.8 mg Oral Q supper   Tbo-filgastrim (GRANIX) SQ  480 mcg Subcutaneous Daily   umeclidinium bromide  1 puff Inhalation Daily   vitamin B-12  1,000 mcg Oral q AM   Continuous Infusions:     LOS: 6 days   Charlynne Cousins  [facility]  04/21/2022, 8:03 AM

## 2022-04-21 NOTE — Progress Notes (Signed)
Pt is NPO for ultra sound. He will have been NPO for 6 hours at 19:00. Ultra sound said they would be up for him around 19:00.

## 2022-04-21 NOTE — Plan of Care (Signed)
  Problem: Health Behavior/Discharge Planning: Goal: Ability to manage health-related needs will improve Outcome: Progressing   

## 2022-04-22 LAB — CBC WITH DIFFERENTIAL/PLATELET
Abs Immature Granulocytes: 0.2 10*3/uL — ABNORMAL HIGH (ref 0.00–0.07)
Basophils Absolute: 0 10*3/uL (ref 0.0–0.1)
Basophils Relative: 0 %
Eosinophils Absolute: 0 10*3/uL (ref 0.0–0.5)
Eosinophils Relative: 0 %
HCT: 24.8 % — ABNORMAL LOW (ref 39.0–52.0)
Hemoglobin: 7.8 g/dL — ABNORMAL LOW (ref 13.0–17.0)
Lymphocytes Relative: 8 %
Lymphs Abs: 0.5 10*3/uL — ABNORMAL LOW (ref 0.7–4.0)
MCH: 28.1 pg (ref 26.0–34.0)
MCHC: 31.5 g/dL (ref 30.0–36.0)
MCV: 89.2 fL (ref 80.0–100.0)
Monocytes Absolute: 0.2 10*3/uL (ref 0.1–1.0)
Monocytes Relative: 4 %
Myelocytes: 3 %
Neutro Abs: 5.1 10*3/uL (ref 1.7–7.7)
Neutrophils Relative %: 85 %
Platelets: 75 10*3/uL — ABNORMAL LOW (ref 150–400)
RBC: 2.78 MIL/uL — ABNORMAL LOW (ref 4.22–5.81)
RDW: 18.8 % — ABNORMAL HIGH (ref 11.5–15.5)
WBC: 6 10*3/uL (ref 4.0–10.5)
nRBC: 0 % (ref 0.0–0.2)

## 2022-04-22 LAB — GLUCOSE, CAPILLARY
Glucose-Capillary: 165 mg/dL — ABNORMAL HIGH (ref 70–99)
Glucose-Capillary: 172 mg/dL — ABNORMAL HIGH (ref 70–99)
Glucose-Capillary: 177 mg/dL — ABNORMAL HIGH (ref 70–99)
Glucose-Capillary: 166 mg/dL — ABNORMAL HIGH (ref 70–99)

## 2022-04-22 LAB — COMPREHENSIVE METABOLIC PANEL
ALT: 130 U/L — ABNORMAL HIGH (ref 0–44)
AST: 80 U/L — ABNORMAL HIGH (ref 15–41)
Albumin: 1.7 g/dL — ABNORMAL LOW (ref 3.5–5.0)
Alkaline Phosphatase: 210 U/L — ABNORMAL HIGH (ref 38–126)
Anion gap: 9 (ref 5–15)
BUN: 57 mg/dL — ABNORMAL HIGH (ref 8–23)
CO2: 25 mmol/L (ref 22–32)
Calcium: 7.4 mg/dL — ABNORMAL LOW (ref 8.9–10.3)
Chloride: 99 mmol/L (ref 98–111)
Creatinine, Ser: 0.86 mg/dL (ref 0.61–1.24)
GFR, Estimated: >60 mL/min (ref >60)
Glucose, Bld: 176 mg/dL — ABNORMAL HIGH (ref 70–99)
Potassium: 3.9 mmol/L (ref 3.5–5.1)
Sodium: 133 mmol/L — ABNORMAL LOW (ref 135–145)
Total Bilirubin: 4.2 mg/dL — ABNORMAL HIGH (ref 0.3–1.2)
Total Protein: 4.8 g/dL — ABNORMAL LOW (ref 6.5–8.1)

## 2022-04-22 LAB — LACTATE DEHYDROGENASE: LDH: 306 U/L — ABNORMAL HIGH (ref 98–192)

## 2022-04-22 LAB — URIC ACID: Uric Acid, Serum: 3.1 mg/dL — ABNORMAL LOW (ref 3.7–8.6)

## 2022-04-22 LAB — PHOSPHORUS: Phosphorus: 4.1 mg/dL (ref 2.5–4.6)

## 2022-04-22 LAB — PREPARE RBC (CROSSMATCH)

## 2022-04-22 MED ORDER — LIP MEDEX EX OINT
TOPICAL_OINTMENT | CUTANEOUS | Status: DC | PRN
Start: 2022-04-22 — End: 2022-04-27
  Administered 2022-04-22: 1 via TOPICAL
  Filled 2022-04-22: qty 7

## 2022-04-22 MED ORDER — SODIUM CHLORIDE 0.9% IV SOLUTION: Freq: Once | INTRAVENOUS | Status: AC

## 2022-04-22 NOTE — Progress Notes (Signed)
  Inpatient Rehabilitation Admissions Coordinator   I met with wife and patient at bedside as well as spoke with eldest son, Blayden, by phone. Patient is not at a level to tolerate the intensity required of an inpatient rehab admit and wife unable to take patient home unless he is ambulatory. He will be wheelchair level . He will need prolonged rehab and they are requesting SNF. Wife prefers [facility]. We will sign off at this time.  Danne Baxter, RN, MSN Rehab Admissions Coordinator [phone] 04/22/2022 3:29 PM

## 2022-04-22 NOTE — Progress Notes (Signed)
Physical Therapy Treatment Patient Details Name: Austin Oliver MRN: [id number] DOB: Jun 03, 1956 Today's Date: 04/22/2022   History of Present Illness 66 y.o. 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 Pt transferred from inpatient rehab to [facility] for chemotherapy.    PT Comments    General Comments: HOH but AxO x 3 following all commands and able to express needs.  Spouse present during session.  Spouse stated, "it has been a tough couple of months.  He has had 3 strokes and now come to find out, he has Cancer".  MMT B LE 0/5 present with B edema R>L .  Pt also incont BM due his Spinal METS.  Assisted OOB required + 2 assist.  General bed mobility comments: pt has NO active mvmt B LE.  Demonstared and instructed how to use a belt to self guide B LE off bed.  Only able to partially complete due to general weakness and "heaviness" B LE.  Pt unable to weight shift/scoot hips as well.  Required + 2 Total assist to transfer to EOB.  Posture is poor and pt required MOD Assist to prevent LOB all planes.  Static sitting tolerance was limited to no more than 5 min EOB with c/o dizziness and MAX c/o fatigue.  "I got to lay down".  Required + 2 Total Asisst back to bed.  Then performed side to side rolling + 2 Max Asisst to place Maxi Move Pad under pt and clean up from a small BM.  Spinal METS also effecting pt's ability to know when he is having a BM.  General transfer comment: pt was performing Sliding Board Transfers at Rehab.  Too weak to attempt this session as well as limited sitting tolerance.  Used [employer] Lift to transfer  OOB to recliner. Rec nursing use MAXI MOVE to assist pt back to bed.   Pt and Spouse hope pt will be able to return to Int Rehab when medically stable.    Recommendations for follow up therapy are one component of a multi-disciplinary discharge planning process, led by the attending physician.  Recommendations may be updated based on patient status, additional functional criteria and insurance authorization.  Follow Up Recommendations  Acute inpatient rehab (3hours/day)     Assistance Recommended at Discharge Frequent or constant Supervision/Assistance  Patient can return home with the following Two people to help with walking and/or transfers;Two people to help with bathing/dressing/bathroom;Assist for transportation;Help with stairs or ramp for entrance;Assistance with feeding;Assistance with cooking/housework   Equipment Recommendations  None recommended by PT    Recommendations for Other Services       Precautions / Restrictions Precautions Precautions: Fall Precaution Comments: new lymphoma dx with spinal mets, BLE paraparesis Restrictions Weight Bearing Restrictions: No     Mobility  Bed Mobility Overal bed mobility: Needs Assistance Bed Mobility: Rolling Rolling: Max assist   Supine to sit: +2 for physical assistance, Total assist     General bed mobility comments: pt has NO active mvmt B LE.  Demonstared and instructed how to use a belt to self guide B LE off bed.  Only able to partially complete due to general weakness  and "heaviness" B LE.  Pt unable to weight shift/scoot hips as well.  Required + 2 Total assist to transfer to EOB.  Posture is poor and pt required MOD Assist to prevent LOB all planes.  Static sitting tolerance was limited to no more than 5 min EOB with c/o dizziness and MAX c/o fatigue.  "I got to lay down".  Required + 2 Total Asisst back to bed.  Then performed side to side rolling + 2 Max Asisst to place Maxi Move Pad under pt and clean up from a small  BM.  Spinal METS also effecting pt's ability to know when he is having a BM.    Transfers                   General transfer comment: pt was performing Sliding Board Transfers at Rehab.  Too weak to attempt this session as well as limited sitting tolerance.  Used [employer] Lift to transfer OOB to recliner.    Ambulation/Gait               General Gait Details: pt is currently non   Marine scientist Rankin (Stroke Patients Only)       Balance                                            Cognition Arousal/Alertness: Awake/alert Behavior During Therapy: WFL for tasks assessed/performed Overall Cognitive Status: Within Functional Limits for tasks assessed                                 General Comments: HOH but AxO x 3 following all commands and able to express needs        Exercises      General Comments        Pertinent Vitals/Pain Pain Assessment Pain Assessment: Faces Faces Pain Scale: Hurts a little bit Pain Location: back Pain Descriptors / Indicators: Grimacing, Guarding Pain Intervention(s): Monitored during session, Repositioned    Home Living                          Prior Function            PT Goals (current goals can now be found in the care plan section) Progress towards PT goals: Progressing toward goals    Frequency    Min 3X/week      PT Plan Current plan remains appropriate    Co-evaluation              AM-PAC PT "6 Clicks" Mobility   Outcome Measure  Help needed turning from your back to your side while in a flat bed without using bedrails?: A Lot Help needed moving from lying on your back to sitting on the side of a flat bed without using bedrails?: Total Help needed moving to and from a bed to a chair (including a wheelchair)?: Total Help needed standing up from a chair using your arms (e.g., wheelchair or bedside chair)?:  Total Help needed to walk in hospital room?: Total Help needed climbing 3-5 steps with a railing? : Total 6 Click Score: 7    End of Session Equipment Utilized During Treatment:  Gait belt Activity Tolerance: Patient limited by pain;Patient limited by fatigue Patient left: in chair;with call bell/phone within reach;with family/visitor present Nurse Communication: Mobility status;Need for lift equipment PT Visit Diagnosis: Muscle weakness (generalized) (M62.81);Other abnormalities of gait and mobility (R26.89);Other symptoms and signs involving the nervous system (R29.898)     Time: 2130-8657 PT Time Calculation (min) (ACUTE ONLY): 26 min  Charges:  $Therapeutic Activity: 23-37 mins                    Rica Koyanagi  PTA Acute  Rehabilitation Services Pager      [phone] Office      [phone]

## 2022-04-22 NOTE — Progress Notes (Addendum)
Brief Hematology/Oncology Progress Note  Clinical Summary: [name] is a 66 year old male with diagnosis of advanced stage diffuse large B-cell lymphoma currently admitted for R-CHOP chemotherapy.  Interval History: -- More alert this morning, RUQ abdominal pain resolved -- Denies nausea and vomiting -- Bowels moving this morning -- Wants to eat today, states he is hungry -- Denies any fevers, chills, sweats.  O:  Vitals:   04/22/22 0421 04/22/22 0754  BP: 98/63   Pulse: 89   Resp: 18   Temp: 98.4 F (36.9 C)   SpO2: 97% 98%      Latest Ref Rng & Units 04/22/2022    6:09 AM 04/21/2022    6:05 AM 04/20/2022    5:50 AM  CMP  Glucose 70 - 99 mg/dL 176   237   296    BUN 8 - 23 mg/dL 57   61   61    Creatinine 0.61 - 1.24 mg/dL 0.86   0.84   0.92    Sodium 135 - 145 mmol/L 133   135   135    Potassium 3.5 - 5.1 mmol/L 3.9   4.9   5.2    Chloride 98 - 111 mmol/L 99   103   104    CO2 22 - 32 mmol/L 25   24   25     Calcium 8.9 - 10.3 mg/dL 7.4   8.0   8.2    Total Protein 6.5 - 8.1 g/dL 4.8   4.7   5.0    Total Bilirubin 0.3 - 1.2 mg/dL 4.2   4.1   3.9    Alkaline Phos 38 - 126 U/L 210   199   146    AST 15 - 41 U/L 80   119   40    ALT 0 - 44 U/L 130   103   44        Latest Ref Rng & Units 04/22/2022    6:09 AM 04/21/2022    6:05 AM 04/20/2022    5:50 AM  CBC  WBC 4.0 - 10.5 K/uL 6.0   11.8   15.1    Hemoglobin 13.0 - 17.0 g/dL 7.8   8.5   9.1    Hematocrit 39.0 - 52.0 % 24.8   25.9   28.2    Platelets 150 - 400 K/uL 75   88   97        GENERAL: Chronically ill-appearing elderly Caucasian male in NAD  SKIN: skin color, texture, turgor are normal, no rashes or significant lesions EYES: conjunctiva are pink and non-injected, sclera clear LUNGS: clear to auscultation and percussion with normal breathing effort HEART: regular rate & rhythm and no murmurs and no lower extremity edema ABD: Right upper quadrant abdominal pain with palpation, abdomen soft PSYCH: alert &  oriented x 3, fluent speech NEURO: no focal motor/sensory deficits  Assessment/Plan:  This is a 66 year old male recently diagnosed with diffuse large B cell non-Hodgkin's lymphoma involving abdominal lymphadenopathy in addition to liver lesions suspicious for bone metastasis.  His overall performance status has been declining recently.  He was participating with inpatient rehab at [facility] and then transferred to [facility] for chemotherapy administration.  He received his first cycle of R-CHOP beginning on 04/17/2022.  He developed abdominal pain during the Rituxan infusion and this was discontinued.  He was rechallenged again on 04/18/2022 with premedications including Tylenol, Benadryl, Solu-Medrol, Pepcid and the rate was kept at 50 mg/h  and he tolerated well.  He is at high risk for tumor lysis syndrome and labs are being monitored closely.  He remains on allopurinol.  He is not eating or drinking very well.  Reports continued decreased strength of his lower extremities.  He was evaluated by radiation oncology and there are no plans for radiation.  It was felt that his symptoms may improve with chemotherapy alone.  #Diffuse large B cell non-Hodgkin's lymphoma --He has abdominal lymphadenopathy, liver lesions, and suspected bone metastasis. --Biopsy of one of his liver lesions performed on 04/07/2022 consistent with high-grade B-cell lymphoma. --Echocardiogram performed on 04/10/2022 showed LVEF of 55 to 60%. --Hepatitis serologies negative. --He has been started on allopurinol due to risk for tumor lysis syndrome. --Received his first cycle of R-CHOP on 04/17/2022.  Had a reaction to Rituxan consisting of abdominal pain.  Rituxan infusion was not completed on day 1 due to infusion reaction, was re-attempted and successfully completed on day 2.  --Recommend for future doses of rituximab to run slowly and premedicate with Tylenol, Benadryl, Solu-Medrol, Pepcid. Plan: --today is Cycle 1 Day 5 --  Granix 480 mcg subcu daily started yesterday, continue x5 days.  Last dose due today. -- Daily labs reviewed. Labs today show a a normal WBC of 6.0, hemoglobin down to 7.8, platelets 75,000, BUN 57, calcium 7.4, albumin 1.7, AST 80, ALT 130, alk phos 210, T. bili 4.2, LDH 306, uric acid 3.1, phosphorus 4.1. --Recommend checking CBC with differential weekly at this point.  #Anemia and thrombocytopenia --The patient has baseline anemia and thrombocytopenia. --Hemoglobin and platelets are downtrending likely due to recent chemotherapy. --Hemoglobin is down to 7.8 today and will give 1 unit PRBCs.  Discussed the risk/benefits of transfusion with the patient and his wife and he agrees to proceed.  Given PRBC transfusion for hemoglobin less than 8. --No need for platelet transfusion at this time  #Right upper quadrant abdominal pain/transaminitis -- He developed abdominal pain on 6/4 which has resolved today.   -- Abdominal ultrasound showed cirrhosis, hypoechoic mass in the right hepatic lobe measuring 2.3 cm, splenomegaly.  No acute findings on ultrasound. --LFTs trending downward. --Monitor  #History of spinal cord infarct --Continues to have lower extremity weakness. --PT eval requested.  Will need rehab following hospital discharge.  #Constipation --On Colace and MiraLAX  Okay to discharge back to [facility] or other rehab facility from an oncology standpoint.  He will need a weekly CBC with differential.  We will arrange for the second cycle of chemotherapy to be administered as an outpatient in our office.  [name]  ADDENDUM: Hematology/Oncology Attending: I had a face-to-face encounter with the patient today.  I reviewed his records, labs and recommended his care plan.  I agree with the above note.  This is a very pleasant 66 years old white male recently diagnosed with diffuse large B cell non-Hodgkin lymphoma involving the abdominal lymph nodes as well as liver and bones.  The  patient started the first cycle of systemic chemotherapy with R-CHOP few days ago and tolerated his treatment well except for initial infusion reaction from Rituxan but that resolved the next day when he received the slow infusion and premedication.  The patient also received Granix injection to help with his total white blood count.  He is feeling fine today with no concerning complaints except for the back pain and also the abdominal distention and constipation. His CBC today showed low hemoglobin and hematocrit, I recommended for the patient to receive  2 units of PRBCs transfusion. The patient can be transferred back to the [facility] inpatient rehab facility for further rehabilitation. He will need weekly blood work and close monitoring for tumor lysis syndrome. I will see him back for follow-up visit in around 2 weeks for evaluation and starting cycle #2 of his treatment either as outpatient or as inpatient. The patient and his wife are in agreement with the current plan. Thank you for taking good care of Mr. Calica.  We will continue to monitor the patient with you and assist in his management on as-needed basis.  Disclaimer: This note was dictated with voice recognition software. Similar sounding words can inadvertently be transcribed and may be missed upon review.  Eilleen Kempf, MD

## 2022-04-22 NOTE — Progress Notes (Signed)
[facility] PROGRESS NOTE    Progress Note  Austin Oliver  TDH:[id number] DOB: 05-07-1956 DOA: 04/09/2022 PCP: [name], MD     Brief Narrative:   Austin Oliver is an 66 y.o. 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  Oncology was consulted and recommended moved to [facility] for urgent chemotherapy.  Port-A-Cath was placed on 04/17/2022. Oncology to start chemotherapy 6.1.2023, cycle every 21 days of prednisone rituximab cyclophosphamide, doxorubicin and vincristine.   Assessment/Plan:   High-grade B-cell lymphoma: Oncology has been consulted. Received R-CHOP on 04/17/2022, had been premedicated for rituximab, which she has later on tolerated. Daily labs monitor for tumor lysis syndrome. Further management per oncology. Physical therapy evaluated the patient will need to go back to inpatient rehab.  History of recurrent CVA and history of spinal cord infarction [facility]): Continue aspirin and Plavix. Post Port-A-Cath placement on 04/16/2022 Continue Crestor and Zetia.  Electrolytes imbalance hyperkalemia hyperphosphatemia His potassium was elevated, he was started on oral Kayexalate and IV fluids basic metabolic panel is pending this morning. We are monitoring his potassium make sure is drifting in the right direction.  Essential hypertension: Blood pressure soft continue to hold home regimen.  Anasarca: He is likely third spacing swelling in his sacrum lower extremity and scrotum. His albumin is 1.7. The  setting of malignancy and chemotherapy. Continue IV Lasix low-dose is about 1 L positive. Basic metabolic panel is pending this morning.  New right upper quadrant abdominal pain: Diffuse, appears to be developing ascites. LFTs are slightly elevated along with alkaline phosphatase.Previous CT scan 03/29/2022 showed small gallstones in the gallbladder. Abdominal ultrasound showed splenomegaly and some perihepatic ascites, infrarenal abdominal aneurysm measuring 3 cm. Gallbladder not well visualized. Elevation in his white blood cell count could be due to Granix. Discontinue Zosyn.  He has remained afebrile. Blood cultures have been negative till date. He relates his abdominal pain has resolved this morning on 04/22/2022. IV Lasix as he has ascites on ultrasound and perihepatic ascites.  COPD: Resume home regimen.  Sick sinus syndrome: Continue current home regimen.    CAD/hyperlipidemia: Continue Crestor and statins.  Pituitary microadenoma: Follow-up with neurosurgery as an outpatient.  BPH: Continue Flomax and finasteride continue Foley.  Diabetes mellitus type 2 with hyperglycemia: With a last A1c of 5.4.  Tolerating his diet Continue long-acting insulin, he is being weaned off steroids his blood sugar should be easier to control. Blood glucose still running greater than 200.  Morbid obesity: Noted.  Stage I sacral decubitus ulcer: Turn patient every 2 hours.   DVT prophylaxis: lovenox Family Communication:none Status is: Inpatient Remains inpatient appropriate because: Newly diagnosed high-grade lymphoma starting chemotherapy as an inpatient.    Code Status:     Code Status Orders  (From admission, onward)           Start     Ordered   03/27/2022 1701  Full code  Continuous        03/23/2022 1703           Code Status History     Date Active Date Inactive  Code Status Order ID Comments User Context   04/04/2022 1429 03/24/2022 1531 Full Code [id number]   Flora Lipps Inpatient   03/25/2022 1919 04/04/2022 1424 Full Code [id number]  Elodia Florence., MD ED   03/16/2022 1557 03/18/2022 2147 Full Code [id number]  Domenic Polite, MD Inpatient   11/20/2021 2246 11/23/2021 2005 Full Code [id number]  Florencia Reasons, MD ED   10/31/2021 1747 11/01/2021 2024 Full Code [id number]  Rod Can, MD Inpatient   04/25/2021 1300 04/25/2021 2113 Full Code [id number]  [name], MD Inpatient   12/04/2020 1205 12/04/2020 2214 Full Code [id number]  [name], MD Inpatient   08/24/2020 1328 08/24/2020 2238 Full Code [id number]  [name], MD Inpatient   03/09/2017 1810 03/11/2017 1707 Full Code [id number]  Rod Can, MD Inpatient         IV Access:   Peripheral IV   Procedures and diagnostic studies:   US Abdomen Complete  Result Date: 04/21/2022 CLINICAL DATA:  Abdominal pain.  Lymphoma. EXAM: ABDOMEN ULTRASOUND COMPLETE COMPARISON:  CT abdomen 03/29/2022 FINDINGS: Gallbladder: The gallbladder was not visualized at sonography, and accordingly was likely contracted at the time of imaging. Common bile duct: Diameter: 0.4 cm Liver: Nodular contour with heterogeneous echotexture. Index focal hypoechoic mass in the right lobe measures 2.0 by 1.5 by 2.3 cm. Portal vein is patent on color Doppler imaging with normal direction of blood flow towards the liver. IVC: No abnormality visualized. Pancreas: Not visualized due to overlying bowel gas. Spleen: Splenomegaly, 12.9 by 7.8 by 15.8 cm (volume = 830 cm^3). The small hypodense lesions seen on prior CT are not well observed on today's ultrasound. Right Kidney: Length: 10.8 cm. Echogenicity within normal limits. Nonobstructive right renal calculi, largest 1.0 cm in long axis. Left Kidney: Length: 13.0 cm. Echogenicity within normal limits. No mass or hydronephrosis visualized. Abdominal aorta: Infrarenal abdominal aortic aneurysm measuring at 3.1 cm diameter on image 119 of series 1 -1. Other findings:  Perihepatic ascites. IMPRESSION: 1. Hepatic cirrhosis. Index focal hypoechoic mass in the right hepatic lobe measures up to 2.3 cm in long axis. There is also some perihepatic ascites which is increased from 03/29/2022. 2. Splenomegaly, splenic volume 830 cc. 3. Gallbladder not well visualized, presumed contracted. 4. Nonobstructive right renal calculi. 5. Infrarenal abdominal aortic aneurysm, 3.1 cm in diameter. Recommend follow-up every 3 years. Reference: J Am Coll Radiol 7858;85:[id number]. Electronically Signed   By: Van Clines M.D.   On: 04/21/2022 20:12     Medical Consultants:   None.   Subjective:    JAXON FLATT is his abdominal pain is improved.  Objective:    Vitals:   04/21/22 0819 04/21/22 1229 04/21/22 2106 04/22/22 0421  BP:  '97/60 99/64 98/63 '$  Pulse:  94 85 89  Resp:  '16 18 18  '$ Temp:  97.8 F (36.6 C) 97.6 F (36.4 C) 98.4 F (36.9 C)  TempSrc:  Oral Oral Oral  SpO2: 97% 96% 97% 97%  Weight:      Height:       SpO2: 97 % O2 Flow Rate (L/min): 2 L/min   Intake/Output Summary (Last 24 hours) at 04/22/2022 0745 Last data filed at 04/22/2022 0700 Gross per 24 hour  Intake 597 ml  Output 1525 ml  Net -928 ml    Filed Weights   04/07/2022 1744  Weight: 93 kg    Exam: General exam: In no acute distress. Respiratory system: Good air movement and clear to auscultation. Cardiovascular system: S1 &  S2 heard, RRR. No JVD. Gastrointestinal system: Abdomen is nondistended, soft and nontender.  Extremities: 3+ edema edema to his coccyx Skin: Stage I ulcer Psychiatry: Judgement and insight appear normal. Mood & affect appropriate. Data Reviewed:    Labs: Basic Metabolic Panel: Recent Labs  Lab 04/16/22 0600 04/18/22 0743 04/19/22 0500 04/20/22 0550 04/21/22 0605  NA 136 133* 133* 135 135  K 5.3* 5.5* 5.0 5.2* 4.9  CL 101 99 102 104 103  CO2 '24 25 25 25 24  '$ GLUCOSE 166* 224* 291* 296* 237*  BUN 44* 62* 59* 61* 61*  CREATININE 0.99 1.11 0.96  0.92 0.84  CALCIUM 9.5 9.0 8.3* 8.2* 8.0*  PHOS  --  5.2* 4.0 3.5 3.4    GFR Estimated Creatinine Clearance: 96.1 mL/min (by C-G formula based on SCr of 0.84 mg/dL). Liver Function Tests: Recent Labs  Lab 04/16/22 0600 04/18/22 0743 04/19/22 0500 04/20/22 0550 04/21/22 0605  AST 34 52* 70* 40 119*  ALT 27 30 37 44 103*  ALKPHOS 102 99 113 146* 199*  BILITOT 2.8* 5.2* 4.4* 3.9* 4.1*  PROT 5.5* 5.2* 4.9* 5.0* 4.7*  ALBUMIN 2.2* 1.7* 1.6* 1.7* 1.7*    No results for input(s): LIPASE, AMYLASE in the last 168 hours. No results for input(s): AMMONIA in the last 168 hours. Coagulation profile No results for input(s): INR, PROTIME in the last 168 hours. COVID-19 Labs  Recent Labs    04/20/22 0550 04/21/22 0605  LDH 646* 463*     Lab Results  Component Value Date   SARSCOV2NAA NEGATIVE 03/25/2022   SARSCOV2NAA NEGATIVE 03/16/2022   SARSCOV2NAA POSITIVE (A) 11/20/2021   SARSCOV2NAA RESULT: NEGATIVE 10/29/2021    CBC: Recent Labs  Lab 04/18/22 0743 04/19/22 0500 04/20/22 0550 04/21/22 0605 04/22/22 0609  WBC 4.4 9.4 15.1* 11.8* 6.0  NEUTROABS 3.6 8.6* 13.2* 7.9* PENDING  HGB 9.9* 9.3* 9.1* 8.5* 7.8*  HCT 30.6* 28.8* 28.2* 25.9* 24.8*  MCV 87.4 87.0 87.3 87.5 89.2  PLT 106* 93* 97* 88* 75*    Cardiac Enzymes: No results for input(s): CKTOTAL, CKMB, CKMBINDEX, TROPONINI in the last 168 hours. BNP (last 3 results) No results for input(s): PROBNP in the last 8760 hours. CBG: Recent Labs  Lab 04/20/22 2116 04/21/22 0714 04/21/22 1225 04/21/22 1632 04/21/22 2117  GLUCAP 327* 207* 237* 212* 209*    D-Dimer: No results for input(s): DDIMER in the last 72 hours. Hgb A1c: No results for input(s): HGBA1C in the last 72 hours. Lipid Profile: No results for input(s): CHOL, HDL, LDLCALC, TRIG, CHOLHDL, LDLDIRECT in the last 72 hours. Thyroid function studies: No results for input(s): TSH, T4TOTAL, T3FREE, THYROIDAB in the last 72 hours.  Invalid input(s):  FREET3 Anemia work up: No results for input(s): VITAMINB12, FOLATE, FERRITIN, TIBC, IRON, RETICCTPCT in the last 72 hours. Sepsis Labs: Recent Labs  Lab 04/19/22 0500 04/20/22 0550 04/21/22 0605 04/22/22 0609  WBC 9.4 15.1* 11.8* 6.0    Microbiology No results found for this or any previous visit (from the past 240 hour(s)).    Medications:    allopurinol  100 mg Oral BID   aspirin EC  81 mg Oral QPM   Chlorhexidine Gluconate Cloth  6 each Topical Daily   clopidogrel  75 mg Oral Daily   docusate sodium  100 mg Oral BID   docusate sodium  1 enema Rectal Daily   enoxaparin (LOVENOX) injection  40 mg Subcutaneous Q24H   ezetimibe  10 mg Oral q AM   feeding  supplement  1 Container Oral TID BM   finasteride  5 mg Oral QHS   furosemide  20 mg Intravenous Q12H   gabapentin  200 mg Oral QHS   insulin aspart  0-5 Units Subcutaneous QHS   insulin aspart  0-9 Units Subcutaneous TID WC   insulin aspart  3 Units Subcutaneous TID WC   insulin detemir  10 Units Subcutaneous Daily   levothyroxine  112 mcg Oral QAC breakfast   multivitamin with minerals  1 tablet Oral Daily   pantoprazole  40 mg Oral QPC supper   polyethylene glycol  17 g Oral Daily   rosuvastatin  5 mg Oral Daily   sodium chloride flush  10-40 mL Intracatheter Q12H   tamsulosin  0.8 mg Oral Q supper   Tbo-filgastrim (GRANIX) SQ  480 mcg Subcutaneous Daily   umeclidinium bromide  1 puff Inhalation Daily   vitamin B-12  1,000 mcg Oral q AM   Continuous Infusions:  piperacillin-tazobactam (ZOSYN)  IV 3.375 g (04/22/22 0154)       LOS: 7 days   Charlynne Cousins  [facility]  04/22/2022, 7:45 AM

## 2022-04-23 LAB — BASIC METABOLIC PANEL
Anion gap: 11 (ref 5–15)
BUN: 65 mg/dL — ABNORMAL HIGH (ref 8–23)
CO2: 24 mmol/L (ref 22–32)
Calcium: 7.4 mg/dL — ABNORMAL LOW (ref 8.9–10.3)
Chloride: 98 mmol/L (ref 98–111)
Creatinine, Ser: 0.99 mg/dL (ref 0.61–1.24)
GFR, Estimated: >60 mL/min (ref >60)
Glucose, Bld: 152 mg/dL — ABNORMAL HIGH (ref 70–99)
Potassium: 3.7 mmol/L (ref 3.5–5.1)
Sodium: 133 mmol/L — ABNORMAL LOW (ref 135–145)

## 2022-04-23 LAB — CBC WITH DIFFERENTIAL/PLATELET
Abs Immature Granulocytes: 0 10*3/uL (ref 0.00–0.07)
Basophils Absolute: 0 10*3/uL (ref 0.0–0.1)
Basophils Relative: 2 %
Eosinophils Absolute: 0 10*3/uL (ref 0.0–0.5)
Eosinophils Relative: 5 %
HCT: 23.9 % — ABNORMAL LOW (ref 39.0–52.0)
Hemoglobin: 7.9 g/dL — ABNORMAL LOW (ref 13.0–17.0)
Immature Granulocytes: 0 %
Lymphocytes Relative: 37 %
Lymphs Abs: 0.2 10*3/uL — ABNORMAL LOW (ref 0.7–4.0)
MCH: 28.7 pg (ref 26.0–34.0)
MCHC: 33.1 g/dL (ref 30.0–36.0)
MCV: 86.9 fL (ref 80.0–100.0)
Monocytes Absolute: 0 10*3/uL — ABNORMAL LOW (ref 0.1–1.0)
Monocytes Relative: 3 %
Neutro Abs: 0.3 10*3/uL — CL (ref 1.7–7.7)
Neutrophils Relative %: 53 %
Platelets: 44 10*3/uL — ABNORMAL LOW (ref 150–400)
RBC: 2.75 MIL/uL — ABNORMAL LOW (ref 4.22–5.81)
RDW: 17.8 % — ABNORMAL HIGH (ref 11.5–15.5)
WBC: 0.6 10*3/uL — CL (ref 4.0–10.5)
nRBC: 0 % (ref 0.0–0.2)

## 2022-04-23 LAB — BPAM RBC
Blood Product Expiration Date: 202307042359
ISSUE DATE / TIME: 202306061644
Unit Type and Rh: 5100

## 2022-04-23 LAB — TYPE AND SCREEN
ABO/RH(D): O POS
Antibody Screen: NEGATIVE
Unit division: 0

## 2022-04-23 LAB — CBC
HCT: 25.6 % — ABNORMAL LOW (ref 39.0–52.0)
Hemoglobin: 8.3 g/dL — ABNORMAL LOW (ref 13.0–17.0)
MCH: 28.5 pg (ref 26.0–34.0)
MCHC: 32.4 g/dL (ref 30.0–36.0)
MCV: 88 fL (ref 80.0–100.0)
Platelets: 56 10*3/uL — ABNORMAL LOW (ref 150–400)
RBC: 2.91 MIL/uL — ABNORMAL LOW (ref 4.22–5.81)
RDW: 17.6 % — ABNORMAL HIGH (ref 11.5–15.5)
WBC: 1 10*3/uL — CL (ref 4.0–10.5)
nRBC: 0 % (ref 0.0–0.2)

## 2022-04-23 LAB — GLUCOSE, CAPILLARY
Glucose-Capillary: 143 mg/dL — ABNORMAL HIGH (ref 70–99)
Glucose-Capillary: 184 mg/dL — ABNORMAL HIGH (ref 70–99)
Glucose-Capillary: 146 mg/dL — ABNORMAL HIGH (ref 70–99)
Glucose-Capillary: 130 mg/dL — ABNORMAL HIGH (ref 70–99)

## 2022-04-23 NOTE — Care Management Important Message (Signed)
Important Message  Patient Details IM Letter given to the Patient Name: Austin Oliver MRN: [id number] Date of Birth: December 01, 1955   Medicare Important Message Given:  Yes     Kerin Salen 04/23/2022, 10:26 AM

## 2022-04-23 NOTE — Progress Notes (Addendum)
[facility] Progress Note  Patient: Austin Oliver     EXH:[id number]  DOA: 04/09/2022   PCP: [name], MD       Brief hospital course:  66 y.o. 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  Oncology was consulted and recommended moved to [facility] for urgent chemotherapy.  Port-A-Cath was placed on 04/17/2022.  Subjective:  No complaints.   Assessment and Plan: Principal Problem: High-grade B-cell lymphoma ([place]) -Admitted for R-CHOP which she received on 6/1-she had a reaction to Rituxan and the infusion was discontinued - On day 2, the patient was premedicated, Rituxan was reattempted and was tolerated -Excessively completed his first cycle of chemotherapy -Granix daily started on 6/5 for 5 days  Active Problems:  Pancytopenia -WBC count is 0.6, neutrophils 0.3, hemoglobin 7.9, platelets 44 - Continue Granix - He received 1 unit of packed red blood cells on 6/6 when hemoglobin was 7.8  New finding of cirrhosis and hyper coag right hepatic mass with splenomegaly - Continue to follow     History of CVA (cerebrovascular accident) and spinal cord infarct Paraplegic - Needs inpatient physical therapy but his insurance is declining to pay for inpatient rehab-patient is appealing this denial - Follow     DVT prophylaxis:  Place and maintain sequential compression device Start: 04/23/22 1412 SCDs Start: 04/05/2022 1700     Code Status: Full Code  Consultants: Oncology Level of Care: Level of care:  Med-Surg Disposition Plan:  Status is: Inpatient Remains inpatient appropriate because: Awaiting SNF  Objective:   Vitals:   04/22/22 2125 04/23/22 0415 04/23/22 0500 04/23/22 1540  BP: (!) 101/58 (!) 93/55  (!) 88/63  Pulse: 94 88  95  Resp: '18 18  16  '$ Temp: 97.6 F (36.4 C) 98 F (36.7 C)  97.8 F (36.6 C)  TempSrc: Oral Oral  Oral  SpO2: 98% 99%  96%  Weight:   101.3 kg   Height:       Filed Weights   03/18/2022 1744 04/23/22 0500  Weight: 93 kg 101.3 kg   Exam: General exam: Appears comfortable  HEENT: PERRLA, oral mucosa moist, no sclera icterus or thrush Respiratory system: Clear to auscultation. Respiratory effort normal. Cardiovascular system: S1 & S2 heard, regular rate and rhythm Gastrointestinal system: Abdomen soft, non-tender, nondistended. Normal bowel sounds   Central nervous system: Alert and oriented.  Unable to move legs Extremities: No cyanosis, clubbing or edema Skin: No rashes or ulcers Psychiatry:  Mood & affect appropriate.    Imaging and lab data was personally reviewed    CBC: Recent Labs  Lab 04/19/22 0500 04/20/22 0550 04/21/22 0605 04/22/22 0609 04/23/22 0520 04/23/22 0909  WBC 9.4 15.1* 11.8* 6.0 1.0* 0.6*  NEUTROABS 8.6* 13.2* 7.9* 5.1  --  0.3*  HGB 9.3* 9.1* 8.5* 7.8* 8.3* 7.9*  HCT 28.8* 28.2* 25.9* 24.8* 25.6* 23.9*  MCV 87.0 87.3 87.5 89.2 88.0 86.9  PLT 93* 97* 88* 75* 56* 44*   Basic Metabolic Panel: Recent Labs  Lab 04/18/22 0743 04/19/22 0500 04/20/22 0550 04/21/22 3810 04/22/22 0609 04/23/22 0520  NA  133* 133* 135 135 133* 133*  K 5.5* 5.0 5.2* 4.9 3.9 3.7  CL 99 102 104 103 99 98  CO2 '25 25 25 24 25 24  '$ GLUCOSE 224* 291* 296* 237* 176* 152*  BUN 62* 59* 61* 61* 57* 65*  CREATININE 1.11 0.96 0.92 0.84 0.86 0.99  CALCIUM 9.0 8.3* 8.2* 8.0* 7.4* 7.4*  PHOS 5.2* 4.0 3.5 3.4 4.1  --    GFR: Estimated Creatinine Clearance: 84.9 mL/min (by C-G formula based on SCr of 0.99 mg/dL).  Scheduled Meds:   allopurinol  100 mg Oral BID   aspirin EC  81 mg Oral QPM   Chlorhexidine Gluconate Cloth  6 each Topical Daily   clopidogrel  75 mg Oral Daily   docusate sodium  100 mg Oral BID   ezetimibe  10 mg Oral q AM   feeding supplement  1 Container Oral TID BM   finasteride  5 mg Oral QHS   furosemide  20 mg Intravenous Q12H   gabapentin  200 mg Oral QHS   insulin aspart  0-5 Units Subcutaneous QHS   insulin aspart  0-9 Units Subcutaneous TID WC   insulin aspart  3 Units Subcutaneous TID WC   insulin detemir  10 Units Subcutaneous Daily   levothyroxine  112 mcg Oral QAC breakfast   multivitamin with minerals  1 tablet Oral Daily   pantoprazole  40 mg Oral QPC supper   polyethylene glycol  17 g Oral Daily   rosuvastatin  5 mg Oral Daily   sodium chloride flush  10-40 mL Intracatheter Q12H   tamsulosin  0.8 mg Oral Q supper   umeclidinium bromide  1 puff Inhalation Daily   vitamin B-12  1,000 mcg Oral q AM   Continuous Infusions:   LOS: 8 days   Author: Debbe Odea  04/23/2022 5:43 PM

## 2022-04-23 NOTE — Progress Notes (Signed)
Nutrition Follow-up  INTERVENTION:   -Boost Breeze po TID, each supplement provides 250 kcal and 9 grams of protein  -Multivitamin with minerals daily  NUTRITION DIAGNOSIS:   Increased nutrient needs related to chronic illness, cancer and cancer related treatments as evidenced by estimated needs.  Ongoing.  GOAL:   Patient will meet greater than or equal to 90% of their needs  Progressing  MONITOR:   PO intake, Supplement acceptance, Labs, Weight trends, I & O's  ASSESSMENT:   66 y.o. 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  5/31: s/p port-a-cath placement.  Patient currently consuming 25-100% of meals. Accepting Boost Breeze most of the time.  Admission weight: 205 lbs. Current weight: 223 lbs.  Per nursing documentation, pt with moderate BLE edema.   Medications: Colace, Lasix, Multivitamin with minerals daily, Miralax, Vitamin B-12  Labs reviewed: CBGS: 143-184 Low Na  Diet Order:   Diet Order             Diet Carb Modified Fluid consistency: Thin; Room service appropriate? Yes; Fluid restriction: 1200 mL Fluid  Diet effective now                   EDUCATION NEEDS:   No education needs have been identified at this time  Skin:  Other: MASD -sacrum  Last BM:  6/7 -type 6  Height:   Ht Readings from Last 1 Encounters:  04/13/2022 '5\' 9"'$  (1.753 m)    Weight:   Wt Readings from Last 1 Encounters:  04/23/22 101.3 kg    BMI:  Body mass index is 32.98 kg/m.  Estimated Nutritional Needs:   Kcal:   2000-2200  Protein:  100-120g  Fluid:  2L/day  Clayton Bibles, MS, RD, LDN Inpatient Clinical Dietitian Contact information available via Amion

## 2022-04-23 NOTE — Progress Notes (Signed)
Occupational Therapy Treatment Patient Details Name: Austin Oliver MRN: [id number] DOB: [birth year] Today's Date: 04/23/2022   History of present illness 66 y.o. 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 Pt seen in [facility]  ED 03/25/22 for left lower extremity weakness and difficulty ambulating, was found to have spinal cord infarction and also noticed widespread metastatic lymphadenopathy the liver biopsy was done which showed high-grade B-cell lymphoma. Pt transferred from inpatient rehab to [facility] for chemotherapy.   OT comments  Patient limited by pain. Treatment focused on activity tolerance and sitting balance. Patient able to sit edge of bed x 4 minutes but in pain the whole time. Therapist educated wife on how to passively range Les while in bed to maintain ROM and assist with edema reduction. Updated POC for SNF as patient doesn't demonstrate the ability to tolerate 3 hrs of therapy.   Recommendations for follow up therapy are one component of a multi-disciplinary discharge planning process, led by the attending physician.  Recommendations may be updated based on patient status, additional functional criteria and insurance authorization.    Follow Up Recommendations  Skilled nursing-short term rehab (<3 hours/day)    Assistance Recommended at Discharge Frequent or constant Supervision/Assistance  Patient can return home with the following  Two people to help with walking and/or transfers;Assist for transportation;Help with stairs or ramp for entrance;Two people to help with bathing/dressing/bathroom   Equipment Recommendations  Other (comment) Architect)    Recommendations for Other Services      Precautions / Restrictions Precautions Precautions: Fall Precaution Comments: new lymphoma dx with spinal mets, BLE paraparesis       Mobility Bed  Mobility Overal bed mobility: Needs Assistance Bed Mobility: Supine to Sit, Sit to Supine     Supine to sit: +2 for physical assistance, Total assist Sit to supine: Total assist, +2 for physical assistance   General bed mobility comments: Requires total assist for transfers. Continues to have no functional movement in LEs. Able to prop at edge of bed with upper extremities but limited by pain in right side/abdomen. Needed external assistance for sitting balance as well.Tolerated approx 4 minutes at edge of bed though in pain the whole time. Returned to supine.    Transfers                         Balance   Sitting-balance support: No upper extremity supported, Bilateral upper extremity supported Sitting balance-Leahy Scale: Poor Sitting balance - Comments: reliant on arms                                   ADL either performed or assessed with clinical judgement   ADL                                              Extremity/Trunk Assessment              Vision   Vision Assessment?: No apparent visual deficits   Perception     Praxis      Cognition Arousal/Alertness: Awake/alert Behavior During Therapy: WFL for tasks assessed/performed Overall Cognitive Status: Within Functional Limits for tasks assessed  Exercises Other Exercises Other Exercises: PROM to BLEs - to maintain ROm and assist with edema reduction. Educated wife on LEs needing to be ranged daily.    Shoulder Instructions       General Comments      Pertinent Vitals/ Pain       Pain Assessment Pain Assessment: 0-10 Pain Score: 9  Pain Location: R side Pain Descriptors / Indicators: Grimacing, Guarding, Moaning Pain Intervention(s): RN gave pain meds during session  Home Living                                          Prior Functioning/Environment               Frequency  Min 2X/week        Progress Toward Goals  OT Goals(current goals can now be found in the care plan section)  Progress towards OT goals: Progressing toward goals  Acute Rehab OT Goals Patient Stated Goal: less pain OT Goal Formulation: With patient Time For Goal Achievement: 05/02/22 Potential to Achieve Goals: [place] Discharge plan needs to be updated    Co-evaluation          OT goals addressed during session:  (activity tolerance, functional mobility)      AM-PAC OT "6 Clicks" Daily Activity     Outcome Measure   Help from another person eating meals?: A Little Help from another person taking care of personal grooming?: A Little Help from another person toileting, which includes using toliet, bedpan, or urinal?: Total Help from another person bathing (including washing, rinsing, drying)?: A Lot Help from another person to put on and taking off regular upper body clothing?: A Lot Help from another person to put on and taking off regular lower body clothing?: Total 6 Click Score: 12    End of Session    OT Visit Diagnosis: Unsteadiness on feet (R26.81);Other abnormalities of gait and mobility (R26.89);Muscle weakness (generalized) (M62.81);History of falling (Z91.81);Pain;Other symptoms and signs involving the nervous system (R29.898)   Activity Tolerance Patient limited by pain   Patient Left in bed;with call bell/phone within reach;with family/visitor present   Nurse Communication Mobility status        Time: 6967-8938 OT Time Calculation (min): 22 min  Charges: OT General Charges $OT Visit: 1 Visit OT Treatments $Therapeutic Activity: 8-22 mins  Derl Barrow, OTR/L [place]  Office [phone] Pager: [place] 04/23/2022, 2:40 PM

## 2022-04-23 NOTE — Plan of Care (Signed)
  Problem: Education: Goal: Knowledge of General Education information will improve Description: Including pain rating scale, medication(s)/side effects and non-pharmacologic comfort measures Outcome: Progressing   Problem: Health Behavior/Discharge Planning: Goal: Ability to manage health-related needs will improve Outcome: Progressing   Problem: Clinical Measurements: Goal: Ability to maintain clinical measurements within normal limits will improve Outcome: Progressing Goal: Will remain free from infection Outcome: Progressing Goal: Diagnostic test results will improve Outcome: Progressing Goal: Respiratory complications will improve Outcome: Progressing Goal: Cardiovascular complication will be avoided Outcome: Progressing   Problem: Coping: Goal: Level of anxiety will decrease Outcome: Progressing   Problem: Elimination: Goal: Will not experience complications related to bowel motility Outcome: Progressing Goal: Will not experience complications related to urinary retention Outcome: Progressing   Problem: Pain Managment: Goal: General experience of comfort will improve Outcome: Progressing   Problem: Safety: Goal: Ability to remain free from injury will improve Outcome: Progressing   Problem: Skin Integrity: Goal: Risk for impaired skin integrity will decrease Outcome: Progressing   Problem: Education: Goal: Ability to describe self-care measures that may prevent or decrease complications (Diabetes Survival Skills Education) will improve Outcome: Progressing Goal: Individualized Educational Video(s) Outcome: Progressing   Problem: Coping: Goal: Ability to adjust to condition or change in health will improve Outcome: Progressing   Problem: Fluid Volume: Goal: Ability to maintain a balanced intake and output will improve Outcome: Progressing   Problem: Health Behavior/Discharge Planning: Goal: Ability to identify and utilize available resources and services  will improve Outcome: Progressing Goal: Ability to manage health-related needs will improve Outcome: Progressing   Problem: Metabolic: Goal: Ability to maintain appropriate glucose levels will improve Outcome: Progressing   Problem: Nutritional: Goal: Maintenance of adequate nutrition will improve Outcome: Progressing Goal: Progress toward achieving an optimal weight will improve Outcome: Progressing   Problem: Skin Integrity: Goal: Risk for impaired skin integrity will decrease Outcome: Progressing   Problem: Tissue Perfusion: Goal: Adequacy of tissue perfusion will improve Outcome: Progressing

## 2022-04-24 DIAGNOSIS — G893 Neoplasm related pain (acute) (chronic): Secondary | ICD-10-CM

## 2022-04-24 DIAGNOSIS — R112 Nausea with vomiting, unspecified: Secondary | ICD-10-CM

## 2022-04-24 DIAGNOSIS — Z515 Encounter for palliative care: Secondary | ICD-10-CM

## 2022-04-24 DIAGNOSIS — J449 Chronic obstructive pulmonary disease, unspecified: Secondary | ICD-10-CM

## 2022-04-24 DIAGNOSIS — Z66 Do not resuscitate: Secondary | ICD-10-CM

## 2022-04-24 DIAGNOSIS — Z7189 Other specified counseling: Secondary | ICD-10-CM

## 2022-04-24 DIAGNOSIS — I495 Sick sinus syndrome: Secondary | ICD-10-CM

## 2022-04-24 LAB — BASIC METABOLIC PANEL
Anion gap: 11 (ref 5–15)
BUN: 72 mg/dL — ABNORMAL HIGH (ref 8–23)
CO2: 25 mmol/L (ref 22–32)
Calcium: 7.6 mg/dL — ABNORMAL LOW (ref 8.9–10.3)
Chloride: 95 mmol/L — ABNORMAL LOW (ref 98–111)
Creatinine, Ser: 1.16 mg/dL (ref 0.61–1.24)
GFR, Estimated: >60 mL/min (ref >60)
Glucose, Bld: 150 mg/dL — ABNORMAL HIGH (ref 70–99)
Potassium: 3.9 mmol/L (ref 3.5–5.1)
Sodium: 131 mmol/L — ABNORMAL LOW (ref 135–145)

## 2022-04-24 LAB — GLUCOSE, CAPILLARY
Glucose-Capillary: 161 mg/dL — ABNORMAL HIGH (ref 70–99)
Glucose-Capillary: 182 mg/dL — ABNORMAL HIGH (ref 70–99)
Glucose-Capillary: 78 mg/dL (ref 70–99)
Glucose-Capillary: 88 mg/dL (ref 70–99)

## 2022-04-24 MED ORDER — MEDIHONEY WOUND/BURN DRESSING EX PSTE
1.0000 "application " | PASTE | Freq: Every day | CUTANEOUS | Status: DC
  Administered 2022-04-24 – 2022-04-25 (×2): 1 via TOPICAL
  Filled 2022-04-24: qty 44

## 2022-04-24 MED ORDER — GABAPENTIN 400 MG PO CAPS
400.0000 mg | ORAL_CAPSULE | Freq: Every day | ORAL | Status: DC
  Administered 2022-04-24: 400 mg via ORAL
  Filled 2022-04-24: qty 1

## 2022-04-24 MED ORDER — MORPHINE SULFATE (PF) 2 MG/ML IV SOLN
2.0000 mg | INTRAVENOUS | Status: DC | PRN
  Administered 2022-04-24: 2 mg via INTRAVENOUS
  Filled 2022-04-24: qty 1

## 2022-04-24 MED ORDER — MORPHINE SULFATE (PF) 2 MG/ML IV SOLN
2.0000 mg | INTRAVENOUS | Status: DC | PRN
  Administered 2022-04-24 – 2022-04-25 (×3): 2 mg via INTRAVENOUS
  Filled 2022-04-24 (×3): qty 1

## 2022-04-24 MED ORDER — LORAZEPAM 2 MG/ML IJ SOLN
0.2500 mg | Freq: Three times a day (TID) | INTRAMUSCULAR | Status: DC | PRN
Start: 2022-04-24 — End: 2022-04-25
  Administered 2022-04-25: 0.25 mg via INTRAVENOUS
  Filled 2022-04-24: qty 1

## 2022-04-24 MED ORDER — FENTANYL 25 MCG/HR TD PT72
1.0000 | MEDICATED_PATCH | TRANSDERMAL | Status: DC
  Administered 2022-04-24: 1 via TRANSDERMAL
  Filled 2022-04-24: qty 1

## 2022-04-24 NOTE — Consult Note (Signed)
[place] Nurse Consult Note: Patient receiving care in [place]. Spouse present at time of my assessment. Reason for Consult: wound on sacrum progress and is now open Wound type: The majority of the wound is an unstageable PI with a grey color. The portion that extends to the adjacent left buttock is maroon. There is loss of overlying tissue to the larger grey area along the coccyx/sacral zones. Pressure Injury POA: No Measurement: 7 cm x 8 cm x unknown depth Wound bed: as described above Drainage (amount, consistency, odor) slight odor Periwound: intact Dressing procedure/placement/frequency: Cleanse the wound to the sacrum, coccyx, left buttock with NS. Pat dry then apply a nickel thick layer of MediHoney directly to the wound then cover with gauze and secure with a foam dressing. Change dressing daily  I have also added turning instructions and an order for a bed with air mattress.  The [place] has requested the bed.  Thank you for the consult.  Discussed plan of care with the patient and bedside nurse.  [place] nurse will not follow at this time.  Please re-consult the [place] team if needed.  Val Riles, RN, MSN, CWOCN, CNS-BC, pager [phone]

## 2022-04-24 NOTE — Progress Notes (Addendum)
Physical Therapy Treatment Patient Details Name: Austin Oliver MRN: [id number] DOB: 08-Mar-1956 Today's Date: 04/24/2022   History of Present Illness 66 y.o. 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  Oncology was consulted and recommended moved to [facility] for urgent chemotherapy.  Port-A-Cath was placed on 04/17/2022.    PT Comments    NT and RN in room.  Can hear pt yelling/moaning in pain thru door.  General bed mobility comments: Required Max/Total Asisst for side to side rolling.  Pt unable to actively move B LE and limited B UE due to increased ABD pain.  Asissted with rolling with NT/RN for sacral dressing change and linen.  Total Assist to scoot to [facility] and position partially sidelying to comfort.  RN stated, pt is getting an airmatress overlay bed change.General transfer comment: unable to tolerated due to pain  level and discomfort with bed mobility. Pt was transferred from [facility] to [facility] for Chemo and was hoping to return to [facility].  Unfortunately, pt is NOT progressing with his mobility and may need a higher level of care such as SNF.  Will consult LPT.    Recommendations for follow up therapy are one component of a multi-disciplinary discharge planning process, led by the attending physician.  Recommendations may be updated based on patient status, additional functional criteria and insurance authorization.  Follow Up Recommendations  Skilled nursing-short term rehab (<3 hours/day)     Assistance Recommended at Discharge  Frequent or constant Supervision/Assistance  Patient can return home with the following Two people to help with walking and/or transfers;Two people to help with bathing/dressing/bathroom;Assist for transportation;Help with stairs or ramp for entrance;Assistance with feeding;Assistance with cooking/housework   Equipment Recommendations  None recommended by PT    Recommendations for Other Services       Precautions / Restrictions Precautions Precautions: Fall Precaution Comments: new lymphoma dx with spinal mets, BLE paraparesis, CHEMO Restrictions Weight Bearing Restrictions: No     Mobility  Bed Mobility Overal bed mobility: Needs Assistance Bed Mobility: Rolling Rolling: Max assist, Total assist, +2 for physical assistance, +2 for safety/equipment         General bed mobility comments: Required Max/Total Asisst for side to side rolling.  Pt unable to actively move B LE and limited B UE due to increased ABD pain.  Asissted with rolling with NT/RN for sacral dressing change and linen.  Total Assist to scoot to [facility] and position partially sidelying to comfort.  RN stated, pt is getting an airmatress overlay bed change.    Transfers                   General transfer comment: unable to tolerated due to pain  level and discomfort with bed mobility    Ambulation/Gait                   Stairs             Wheelchair Mobility    Modified Rankin (Stroke Patients Only)       Balance  Cognition Arousal/Alertness: Awake/alert Behavior During Therapy: WFL for tasks assessed/performed                                   General Comments: HOH but AxO x 3 following all commands and able to express needs.  In alot pain.        Exercises      General Comments        Pertinent Vitals/Pain Pain Assessment Pain Assessment: Faces Faces Pain Scale: Hurts even more Pain  Location: RUQ ABD Pain Descriptors / Indicators: Grimacing, Guarding, Moaning Pain Intervention(s): Limited activity within patient's tolerance, Monitored during session    Home Living                          Prior Function            PT Goals (current goals can now be found in the care plan section) Progress towards PT goals: Not progressing toward goals - comment    Frequency    Min 3X/week      PT Plan Current plan remains appropriate    Co-evaluation              AM-PAC PT "6 Clicks" Mobility   Outcome Measure  Help needed turning from your back to your side while in a flat bed without using bedrails?: Total Help needed moving from lying on your back to sitting on the side of a flat bed without using bedrails?: Total Help needed moving to and from a bed to a chair (including a wheelchair)?: Total Help needed standing up from a chair using your arms (e.g., wheelchair or bedside chair)?: Total Help needed to walk in hospital room?: Total Help needed climbing 3-5 steps with a railing? : Total 6 Click Score: 6    End of Session Equipment Utilized During Treatment: Gait belt Activity Tolerance: Patient limited by pain;Patient limited by fatigue Patient left: in bed;with bed alarm set;with call bell/phone within reach;with family/visitor present Nurse Communication: Mobility status;Need for lift equipment PT Visit Diagnosis: Muscle weakness (generalized) (M62.81);Other abnormalities of gait and mobility (R26.89);Other symptoms and signs involving the nervous system (R29.898)     Time: 9604-5409 PT Time Calculation (min) (ACUTE ONLY): 13 min  Charges:  $Therapeutic Activity: 8-22 mins                     Rica Koyanagi  PTA Acute  Rehabilitation Services Pager      [phone] Office      [phone]

## 2022-04-24 NOTE — Consult Note (Signed)
Palliative Care Consult Note                                  Date: 04/24/2022   Patient Name: Austin Oliver  DOB: 15-Jan-1956  MRN: [id number]  Age / Sex: 66 y.o., male  PCP: [name], MD Referring Physician: Debbe Odea, MD  Reason for Consultation: Establishing goals of care, Non pain symptom management, and Pain control  HPI/Patient Profile: Palliative Care consult requested for goals of care discussion and symptom management in this 66 y.o. 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 He was admitted on 03/23/2022 to [facility] from Inpatient rehab for chemo induction. Port-A-Cath was placed on 04/17/22.    Past Medical History:  Diagnosis Date   Cirrhosis of liver ([place])    COPD (chronic obstructive pulmonary disease) ([place])    Coronary artery calcification seen on CAT scan 01/14/2017   Essential hypertension 09/11/2020   GERD (gastroesophageal reflux disease)    History of kidney stones    Hyperlipidemia    Hypothyroidism    OSA on CPAP    Mild with AHI 7.8/hr >>on CPAP   Osteoarthritis    Presence of permanent cardiac pacemaker    Shingles 2012     Subjective:   This NP Osborne Oman reviewed medical records, received report from team, assessed the patient and then met at the patient's bedside with [place], RN, patient, and his wife, Ignacio Lowder to discuss diagnosis, prognosis, GOC, EOL wishes disposition and options.  Prior to entering patient's room Mr. Fussell could be heard moaning and complaining of ongoing pain/discomfort. He was repositioned in bed with observation of  increased pain. Also became nauseated with [place] colored emesis. He is alert and oriented however, unable to engage in goals of care discussions due to level of discomfort.    Concept of Palliative Care was introduced as specialized medical care for people and their families living with serious illness.  It focuses on providing relief from the symptoms and stress of a serious illness.  The goal is to improve quality of life for both the patient and the family. Values and goals of care important to patient and family were attempted to be elicited.  Mrs. Infinger shares she and patient have been married for over 41 years. They have 2 sons and 6 grandchildren (one on the way 6/18). She is emotional expressing their hopes that patient would still be alive to meet him. Emotional support provided.   I created space and opportunity for wife to explore state of health prior to admission, thoughts, and feelings.   We discussed His current illness and what it means in the larger context of His on-going co-morbidities. Natural disease trajectory and expectations were discussed.  Vickii Chafe is emotional sharing her realistic understanding of her husbands declining health and ongoing challenges. She speaks of his multiple strokes in January s/p pacemaker and battles with COVID. With further decline in April including recurrent CVA. He was transferred to inpatient rehab and seemed to be doing well however further work-up later found his metastatic cancer. Emotional support provided.  Wife is tearful expressing patient's significant decline in quality of life  and health. She verbalized understanding of 50/50 response to chemotherapy which is why they chose to proceed to allow Mr. Gavin with the hopes of response and some improvement however she does not feel that their wishes are happening. Vickii Chafe states he continues to decline in a more rapid manner. She does not wish to see her husband suffer and knows that he would not want  to live forever in his current state of health. Vickii Chafe speaks to his constant pain and discomfort which also is worsening despite use of medications.   We discussed at length focusing on his symptoms and comfort vs ongoing medical interventions. Vickii Chafe is unsure how much more Aris can endure. Emotional support provided.   I offered to include their children in discussions. She has contacted her sons as she would appropriately not feel comfortable making any final decisions without their involvement and support. Sons would like to meet on tomorrow 6/9 @ 11am. Confirmed we will plan to meet in patients room.   Education provided on aggressive symptom management while continuing to treat the treatable as family prepare to make complex decisions in the upcoming days. Wife verbalized understanding and appreciation.   I discussed pain management and starting him on fentanyl patch vs oral medications given ongoing nausea and uncontrolled pain. He will continue to receive other medications for breakthrough pain. [name] verbalized agreement.   I discussed the importance of continued conversation with family and their medical providers regarding overall plan of care and treatment options, ensuring decisions are within the context of the patients values and GOCs.  Questions and concerns were addressed. The family was encouraged to call with questions or concerns.  PMT will continue to support holistically as needed.   Objective:   Primary Diagnoses: Present on Admission:  Chronic retention of urine, due to BPH  COPD (chronic obstructive pulmonary disease) (HCC)  CAD (coronary artery disease)  Essential hypertension  Pituitary microadenoma (HCC)  Sick sinus syndrome (HCC)  Spinal cord infarction (HCC)  Splenomegaly  Cirrhosis (HCC)  AAA (abdominal aortic aneurysm) (HCC)  Cardiac pacemaker in situ   Scheduled Meds:  allopurinol  100 mg Oral BID   aspirin EC  81 mg Oral QPM   Chlorhexidine  Gluconate Cloth  6 each Topical Daily   clopidogrel  75 mg Oral Daily   docusate sodium  100 mg Oral BID   ezetimibe  10 mg Oral q AM   feeding supplement  1 Container Oral TID BM   fentaNYL  1 patch Transdermal Q72H   finasteride  5 mg Oral QHS   gabapentin  200 mg Oral QHS   insulin aspart  0-5 Units Subcutaneous QHS   insulin aspart  0-9 Units Subcutaneous TID WC   insulin aspart  3 Units Subcutaneous TID WC   insulin detemir  10 Units Subcutaneous Daily   leptospermum manuka honey  1 application  Topical Daily   levothyroxine  112 mcg Oral QAC breakfast   multivitamin with minerals  1 tablet Oral Daily   pantoprazole  40 mg Oral QPC supper   polyethylene glycol  17 g Oral Daily   rosuvastatin  5 mg Oral Daily   sodium chloride flush  10-40 mL Intracatheter Q12H   tamsulosin  0.8 mg Oral Q supper   umeclidinium bromide  1 puff Inhalation Daily   vitamin B-12  1,000 mcg Oral q AM    Continuous Infusions:   PRN Meds: alum & mag hydroxide-simeth, HYDROcodone-acetaminophen, lip balm, LORazepam, morphine injection, ondansetron **  OR** ondansetron (ZOFRAN) IV, polyethylene glycol, prochlorperazine, sodium chloride flush, traMADol  Allergies  Allergen Reactions   Bee Venom Swelling and Other (See Comments)    Extreme Swelling of site     Cleocin [Clindamycin Hcl] Rash and Other (See Comments)    RASH IN BETWEEN FINGERS   Statins Other (See Comments)    Muscles aches with several statins    Review of Systems  Unable to perform ROS: Acuity of condition   Unless otherwise noted, a complete review of systems is negative.  Physical Exam General: distressed, moaning/grimacing, frail, ill appearing Cardiovascular: Tachycardic  Pulmonary: diminished bilaterally  Abdomen: soft, tender, distended, + bowel sounds Extremities: bilateral pedal edema, no joint deformities Neurological: will awaken, answer some questions, complains of severe pain, alert an appropriate to most  questions.   Vital Signs:  BP (!) 98/56 (BP Location: Left Arm)   Pulse 81   Temp 98.9 F (37.2 C) (Oral)   Resp 18   Ht $R'5\' 9"'JI$  (1.753 m)   Wt 101.3 kg   SpO2 97%   BMI 32.98 kg/m  Pain Scale: 0-10 POSS *See Group Information*: 1-Acceptable,Awake and alert Pain Score: 3   SpO2: SpO2: 97 % O2 Device:SpO2: 97 % O2 Flow Rate: .O2 Flow Rate (L/min): 2 L/min  IO: Intake/output summary:  Intake/Output Summary (Last 24 hours) at 04/24/2022 1611 Last data filed at 04/24/2022 1222 Gross per 24 hour  Intake 220 ml  Output 1050 ml  Net -830 ml    LBM: Last BM Date : 04/22/22 Baseline Weight: Weight: 93 kg Most recent weight: Weight: 101.3 kg      Palliative Assessment/Data: PPS 10-20%   Advanced Care Planning:   Primary Decision Maker: HCPOA-[name] Ament (Wife)  Code Status/Advance Care Planning: DNR  A discussion was had today regarding advanced directives. Concepts specific to code status, artifical feeding and hydration, continued IV antibiotics and rehospitalization was had. Mr. Cowher has a documented advanced directive. Vickii Chafe has documents in her purse. Directives are scanned into [employer]. We reviewed directives at length and wife clarified wishes and plans to follow patients directives. No artifical feedings/life sustaining measures.   Per documentation and concern for poor prognosis I empathetically approached discussions regarding Kyrian's full code status. Education provided on what DNR/DNI would look like for patient as his wishes are documented for desires of natural death and no heroic measures. [name] verbalized understanding confirming her awareness of his poor conditions. She is requesting DNR/DNI. Education provided and she is aware RN will place bracelet on patient to identify his wishes to all staff. Wife verbalized appreciation.   The difference between a aggressive medical intervention path and a palliative comfort care path was discussed. Mrs.  Splawn wish to defer  complex decisions until final discussions including her sons on tomorrow. She is leaning towards focusing on his comfort.    Assessment & Plan:   SUMMARY OF RECOMMENDATIONS   DNR/DNI-as requested and confirmed by wife Advanced Directives personally reviewed. Patient is clear in his documented wishes for no heroic measures, no artificial feeding tubes, and no cremation.  Continue with current plan of care, treating the treatable as family prepare to make complex decisions in the upcoming days. Leaning towards comfort focused path.  Extensive goals of care discussions. Patient's sons will be available tomorrow and we have planned for family meeting at 82 am on 6/9. Family aware I will meet them at patient's room.  PMT will continue to support and follow. Please secure chat or call  team line with urgent needs.   Symptom Management:  Neoplasm related pain/sacral ulcer Morphine 37m every 3 hours as needed for pain Gabapentin 400 mg at bedtime  Hydrocodone 1-2 tabs every 4 hours as needed for pain Fentanyl patch 25 mcg Nausea/vomiting Zofran 432mevery 6 hours as needed for nausea Compazine every 6 hours as needed for nausea Ativan 0.25-0.5 mg every 8 hours as needed for nausea/anxiety Constipation Miralax daily   Palliative Prophylaxis:  Bowel Regimen, Frequent Pain Assessment, Oral Care, Palliative Wound Care, and Turn Reposition  Additional Recommendations (Limitations, Scope, Preferences): No Artificial Feeding and DNR/DNI, treat the treatable pending family discussions, no escalationn   Psycho-social/Spiritual:  Desire for further Chaplaincy support: yes Additional Recommendations: Caregiving  Support/Resources and Education on Hospice  Prognosis:  POOR  Discharge Planning:  To Be Determined   Discussed with: Family, EkAdline PealsRN, and Dr. RiWynelle ClevelandWife expressed understanding and was in agreement with this plan.   Time Total: 90 min   Number and complexity of problems  addressed: 4 HIGH - 1 or more chronic illnesses with SEVERE exacerbation, progression, or side effects of treatment - advanced cancer, pain. Any controlled substances utilized were prescribed in the context of palliative care.   Time Total: 90 min   Visit consisted of counseling and education dealing with the complex and emotionally intense issues of symptom management and palliative care in the setting of serious and potentially life-threatening illness.Greater than 50%  of this time was spent counseling and coordinating care related to the above assessment and plan.  Signed by:  NiAlda LeaAGPCNP-BC Palliative Medicine Team /WStronghone: [phone]: [phone]: N.Bjorn Pippin Thank you for allowing the Palliative Medicine Team to assist in the care of this patient. Please utilize secure chat with additional questions, if there is no response within 30 minutes please call the above phone number. Palliative Medicine Team providers are available by phone from 7am to 5pm daily and can be reached through the team cell phone.  Should this patient require assistance outside of these hours, please call the patient's attending physician.

## 2022-04-24 NOTE — Progress Notes (Signed)
[facility] Progress Note  Patient: Austin Oliver     XVQ:[id number]  DOA: 03/31/2022   PCP: [name], MD       Brief hospital course:  66 y.o. 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  Oncology was consulted and recommended moved to [facility] for urgent chemotherapy.  Port-A-Cath was placed on 04/17/2022.  Subjective:  I evaluated the patient this morning, he was asleep and had difficulty staying awake.  His wife stated that he has been complaining of abdominal pain and had received morphine IV.  He is not eating sufficiently and although she is trying to get him to drink dietary supplements, he is not doing so adequately.   Assessment and Plan: Principal Problem: High-grade B-cell lymphoma ([place]) -Admitted for R-CHOP which she received on 6/1-she had a reaction to Rituxan and the infusion was discontinued - On day 2, the patient was premedicated, Rituxan was reattempted and was tolerated - completed his first cycle of chemotherapy -Granix daily started on 6/5 for 5 days -On exam, his abdomen is distended and very tender to light palpation-I feel this is likely related to extensive metastasis which have not improved with chemotherapy -It appears that oral intake has dropped significantly and he is either in pain or poorly responsive after receiving pain medications. He is not able to participate with PT due to abdominal pain. - I have asked palliative care to speak with the patient and the wife in regards to  transitioning to hospice today  Active Problems:  Poor oral intake - Eating less than 50% of his meals  Pancytopenia secondary to chemotherapy -WBC count is 0.6, neutrophils 0.3, hemoglobin 7.9, platelets 44 - Continue Granix - He received 1 unit of packed red blood cells on 6/6 when hemoglobin was 7.8  Sacral pressure ulcer - Unstageable pressure ulcer/coccyx crossing the midline -Appreciate wound care eval -Air mattress ordered  New finding of cirrhosis and splenomegaly - noted on imaging   History of CVA (cerebrovascular accident) and spinal cord infarct Paraplegic     DVT prophylaxis:  Place and maintain sequential compression device Start: 04/23/22 1412 SCDs Start: 04/11/2022 1700     Code Status: Full Code  Consultants: Oncology Level of Care: Level of care: Med-Surg Disposition Plan:  Status is: Inpatient Remains inpatient appropriate because: palliative discussions- needs hospice care now  Objective:   Vitals:   04/23/22 2017 04/24/22 0536 04/24/22 0802 04/24/22 1329  BP: (!) 88/52 (!) 92/48  (!) 98/56  Pulse: 87 85  81  Resp: '18 18  18  '$ Temp: 98 F (36.7 C) 98.2 F (36.8 C)  98.9 F (37.2 C)  TempSrc: Oral Oral  Oral  SpO2: 97% 95% 95% 97%  Weight:      Height:       Filed Weights   03/17/2022 1744 04/23/22 0500  Weight: 93 kg 101.3 kg   Exam: General exam: Appears comfortable when undisturbed HEENT: PERRL, oral mucosa moist, no sclera icterus or thrush Respiratory system: Clear to auscultation. Respiratory effort normal. Cardiovascular system: S1 & S2 heard, regular rate and rhythm Gastrointestinal  system: Abdomen soft, tender and distended, Normal bowel sounds   Central nervous system: awakens only briefly- does not answer questions or participate with exam. Extremities: No cyanosis, clubbing - b/l pedal edema      Imaging and lab data was personally reviewed    CBC: Recent Labs  Lab 04/19/22 0500 04/20/22 0550 04/21/22 0605  04/22/22 0609 04/23/22 0520 04/23/22 0909  WBC 9.4 15.1* 11.8* 6.0 1.0* 0.6*  NEUTROABS 8.6* 13.2* 7.9* 5.1  --  0.3*  HGB 9.3* 9.1* 8.5* 7.8* 8.3* 7.9*  HCT 28.8* 28.2* 25.9* 24.8* 25.6* 23.9*  MCV 87.0 87.3 87.5 89.2 88.0 86.9  PLT 93* 97* 88* 75* 56* 44*    Basic Metabolic Panel: Recent Labs  Lab 04/18/22 0743 04/19/22 0500 04/20/22 0550 04/21/22 0605 04/22/22 0609 04/23/22 0520 04/24/22 0610  NA 133* 133* 135 135 133* 133* 131*  K 5.5* 5.0 5.2* 4.9 3.9 3.7 3.9  CL 99 102 104 103 99 98 95*  CO2 '25 25 25 24 25 24 25  '$ GLUCOSE 224* 291* 296* 237* 176* 152* 150*  BUN 62* 59* 61* 61* 57* 65* 72*  CREATININE 1.11 0.96 0.92 0.84 0.86 0.99 1.16  CALCIUM 9.0 8.3* 8.2* 8.0* 7.4* 7.4* 7.6*  PHOS 5.2* 4.0 3.5 3.4 4.1  --   --     GFR: Estimated Creatinine Clearance: 72.5 mL/min (by C-G formula based on SCr of 1.16 mg/dL).  Scheduled Meds:  allopurinol  100 mg Oral BID   aspirin EC  81 mg Oral QPM   Chlorhexidine Gluconate Cloth  6 each Topical Daily   clopidogrel  75 mg Oral Daily   docusate sodium  100 mg Oral BID   ezetimibe  10 mg Oral q AM   feeding supplement  1 Container Oral TID BM   finasteride  5 mg Oral QHS   gabapentin  200 mg Oral QHS   insulin aspart  0-5 Units Subcutaneous QHS   insulin aspart  0-9 Units Subcutaneous TID WC   insulin aspart  3 Units Subcutaneous TID WC   insulin detemir  10 Units Subcutaneous Daily   leptospermum manuka honey  1 application. Topical Daily   levothyroxine  112 mcg Oral QAC breakfast   multivitamin with minerals  1 tablet Oral Daily   pantoprazole  40 mg Oral QPC supper   polyethylene glycol  17 g Oral Daily   rosuvastatin  5 mg Oral Daily   sodium chloride flush  10-40 mL Intracatheter Q12H   tamsulosin  0.8 mg Oral Q supper   umeclidinium bromide  1 puff Inhalation Daily   vitamin B-12  1,000 mcg Oral q AM   Continuous Infusions:   LOS: 9 days   Author: Debbe Odea  04/24/2022 3:09 PM

## 2022-04-24 NOTE — TOC Progression Note (Signed)
Transition of Care [facility]) - Progression Note    Patient Details  Name: Austin Oliver MRN: [id number] Date of Birth: October 31, 1956  Transition of Care [facility]) CM/SW Contact  Zohra Clavel, Juliann Pulse, RN Phone Number: 04/24/2022, 3:22 PM  Clinical Narrative: Left vm w/spouse to discuss if agreeable to SNF-await call back.      Expected Discharge Plan: [place] Barriers to Discharge: Continued Medical Work up  Expected Discharge Plan and Services Expected Discharge Plan: [place]   Discharge Planning Services: CM Consult Post Acute Care Choice: [place] Living arrangements for the past 2 months: Single Family Home                                       Social Determinants of Health (SDOH) Interventions    Readmission Risk Interventions     No data to display

## 2022-04-24 NOTE — Progress Notes (Signed)
Brief oncology note:  Next cycle of chemotherapy due on 05/08/2022.  I have sent a scheduling message to the cancer center to arrange appointments.  The patient will be contacted with the date/time.  Austin Oliver

## 2022-04-25 DIAGNOSIS — Z8673 Personal history of transient ischemic attack (TIA), and cerebral infarction without residual deficits: Secondary | ICD-10-CM

## 2022-04-25 LAB — GLUCOSE, CAPILLARY
Glucose-Capillary: 69 mg/dL — ABNORMAL LOW (ref 70–99)
Glucose-Capillary: 91 mg/dL (ref 70–99)
Glucose-Capillary: 111 mg/dL — ABNORMAL HIGH (ref 70–99)
Glucose-Capillary: 124 mg/dL — ABNORMAL HIGH (ref 70–99)
Glucose-Capillary: 148 mg/dL — ABNORMAL HIGH (ref 70–99)
Glucose-Capillary: 153 mg/dL — ABNORMAL HIGH (ref 70–99)

## 2022-04-25 MED ORDER — MORPHINE SULFATE (PF) 4 MG/ML IV SOLN
4.0000 mg | INTRAVENOUS | Status: DC | PRN
  Filled 2022-04-25: qty 1

## 2022-04-25 MED ORDER — HYDROMORPHONE BOLUS VIA INFUSION
1.0000 mg | INTRAVENOUS | Status: DC | PRN
  Administered 2022-04-25: 1 mg via INTRAVENOUS

## 2022-04-25 MED ORDER — GLYCOPYRROLATE 0.2 MG/ML IJ SOLN
0.3000 mg | INTRAMUSCULAR | Status: DC | PRN
  Administered 2022-04-25: 0.3 mg via INTRAVENOUS
  Filled 2022-04-25: qty 2

## 2022-04-25 MED ORDER — ACETAMINOPHEN 650 MG RE SUPP: 650.0000 mg | Freq: Four times a day (QID) | RECTAL | Status: DC | PRN

## 2022-04-25 MED ORDER — SODIUM CHLORIDE 0.9 % IV SOLN
0.5000 mg/h | INTRAVENOUS | Status: DC
  Administered 2022-04-25: 1 mg/h via INTRAVENOUS
  Filled 2022-04-25: qty 5

## 2022-04-25 MED ORDER — HALOPERIDOL LACTATE 2 MG/ML PO CONC: 0.5000 mg | ORAL | Status: DC | PRN

## 2022-04-25 MED ORDER — LORAZEPAM 2 MG/ML IJ SOLN
0.5000 mg | Freq: Four times a day (QID) | INTRAMUSCULAR | Status: DC | PRN
Start: 2022-04-25 — End: 2022-04-27

## 2022-04-25 MED ORDER — POLYVINYL ALCOHOL 1.4 % OP SOLN: 1.0000 [drp] | Freq: Four times a day (QID) | OPHTHALMIC | Status: DC | PRN

## 2022-04-25 MED ORDER — HALOPERIDOL LACTATE 5 MG/ML IJ SOLN: 0.5000 mg | INTRAMUSCULAR | Status: DC | PRN

## 2022-04-25 MED ORDER — POLYETHYLENE GLYCOL 3350 17 G PO PACK: 17.0000 g | PACK | Freq: Every day | ORAL | Status: DC | PRN

## 2022-04-25 MED ORDER — BIOTENE DRY MOUTH MT LIQD
15.0000 mL | OROMUCOSAL | Status: DC | PRN
Start: 2022-04-25 — End: 2022-04-25

## 2022-04-25 MED ORDER — SCOPOLAMINE 1 MG/3DAYS TD PT72
1.0000 | MEDICATED_PATCH | TRANSDERMAL | Status: DC
  Administered 2022-04-25: 1.5 mg via TRANSDERMAL
  Filled 2022-04-25: qty 1

## 2022-04-25 NOTE — Progress Notes (Signed)
Palliative Medicine Inpatient Follow Up Note     Chart Reviewed. Patient examined at the bedside. Mr. Liska continues to appear uncomfortable. Intermittent moaning and grimacing. He will awakens and says he "hurt all over"!. He was started on fentanyl patch on yesterday for additional pain support. Morphine was increased today however nursing staff expresses patient complains of ongoing pain within an hour of receiving. Appetite remains minimal.   Wife and son, Broadus John are at the bedside for family goals of care meeting. His son, Devontaye Ground is on speaker phone. Family expressed realistic understanding of Mr. Bathe poor prognosis. His sons expressed they do not wish for the father to suffer and shares noticeable decline within the past week. They were initially hopeful for some improvement/stability with receiving extensive treatments however they now feel that he is not strong enough to continue with interventions. They share how much of a "fighter" Jarry has been over the past several months despite all of his health challenges. Emotional support provided.   We discussed what would be most important to Mr. Dawes if he was able to express his wishes. Family mutually agreed that per previous discussions and awareness he is big on quality of life Tramond would not wish to want to prolong his life given his current state of health. I shared with family at times care requires focusing more on comfort and symptom management allowing him to be as comfortable and at peace as possible for what time he has left. In this setting the focus is on doing this for his comfort versus doing this to Mr. Imperato with understanding there are minimal chances of a meaningful recovery. Family verbalized understanding.   Detailed education provided on continued medical interventions versus comfort focus path and outpatient hospice support vs hospital death. Family verbalized understanding expressing mutual request to  discontinue with all treatments unless focused on symptom management and end-of-life care.   Education provided on comfort focused care/symptom management and what this would look like while hospitalized. We discussed use of dilaudid or morphine drip to better control Mr. Suleiman pain. Wife verbalized understanding expressing she does not wish for him to continue to be in uncontrolled pain. She stayed overnight and felt he was in distressed for majority of the time.   Family shares they live in [place] and would prefer [place] if patient is stable to transfer to a residential hospice facility. Education provided on referral process and transfer once accepted and bed is available. Family aware patient is at high risk of sudden death while hospitalized.   Education also provided on grief and spiritual support available for family. They share his teenage granddaughter is struggling emotionally as her grandmother passed away a month prior and she recently loss a friend in a tragic ATV accident. Information provided on [facility] program. Offered spiritual support however family declines at this time and is reaching out to family church family.    Questions addressed and support provided.    Objective Assessment: Vital Signs Vitals:   04/24/22 2222 04/25/22 0632  BP: (!) 94/51 (!) 88/44  Pulse: 88 93  Resp: 18 16  Temp: 97.9 F (36.6 C) (!) 97.5 F (36.4 C)  SpO2: (!) 85% (!) 85%    Intake/Output Summary (Last 24 hours) at 04/25/2022 1438 Last data filed at 04/25/2022 0600 Gross per 24 hour  Intake 120 ml  Output 450 ml  Net -330 ml   Last Weight  Most recent update: 04/23/2022  5:46 AM    Weight  101.3 kg (223 lb 5.2 oz)            Gen:  restless, moaning in pain,  ill appearing HEENT: darken Rosenberg staining/content in mouth and on chin from previous emesis CV: tachycardic, hypotensive  PULM: diminished, rhonchi, audible secretions  EXT: bilateral lower  extremity edema  Neuro: unable to answer questions or follow commands   SUMMARY OF RECOMMENDATIONS   DNR/DNI-form completed and on chart Extensive goals of care meeting with family (wife and 2 sons). Family is clear in expressed wishes to focus on patient's comfort, no further interventions.  Transition all care to comfort focused per family's request. Education provided on symptom management and comfort focused care while hospitalized. Discontinue medications and treatments. TOC referral for residential hospice placement. [facility]  Liquifilm tears PRN for dry eyes Haldol PRN for agitation/anxiety May have comfort feeding Unrestricted visitations in the setting of EOL (per policy) Oxygen PRN 2L or less for comfort. No escalation.   PMT will continue to support and follow. Please secure chat or call team line with urgent needs.  I will be off service returning on Monday 6/12.    Symptom Management:  Neoplasm related pain/sacral ulcer Dilaudid infusion with boluses available. RN may titrate for comfort/aggressive symptom management in the setting of EOL Fentanyl patch 25 mcg Nausea/vomiting Zofran '4mg'$  every 6 hours as needed for nausea Compazine every 6 hours as needed for nausea Ativan 0.5 mg every 8 hours as needed for nausea/anxiety Constipation Miralax as needed  Secretions Robinul as needed  Scopolamine patch as prescribed Family Support  Family declined spiritual care support at this time.  Comfort cart    Discussed with Dr. Bunnie Domino, RN  Time Total: 65 min   Number and complexity of problems addressed: 5 HIGH - 1 or more chronic illnesses with SEVERE exacerbation, progression, or side effects of treatment - advanced cancer, pain. Any controlled substances utilized were prescribed in the context of palliative care.   Visit consisted of counseling and education dealing with the complex and emotionally intense  issues of symptom management and palliative care in the setting of serious and potentially life-threatening illness.Greater than 50%  of this time was spent counseling and coordinating care related to the above assessment and plan.  Alda Lea, AGPCNP-BC  Palliative Medicine Team/[place]   Palliative Medicine Team providers are available by phone from 7am to 7pm daily and can be reached through the team cell phone [phone]. Should this patient require assistance outside of these hours, please call the patient's attending physician.

## 2022-04-25 NOTE — Progress Notes (Signed)
He remains in pain after the Fentanyl patch, 2 mg of Morphine !V is not fully controlling it. Will increase to 4 mg now. He has secretions in his throat and a cough because of these. Will add a scopolamine patch.   Austin Oliver

## 2022-04-25 NOTE — Progress Notes (Signed)
Inpatient Diabetes Program Recommendations  AACE/ADA: New Consensus Statement on Inpatient Glycemic Control (2015)  Target Ranges:  Prepandial:   less than 140 mg/dL      Peak postprandial:   less than 180 mg/dL (1-2 hours)      Critically ill patients:  140 - 180 mg/dL   Lab Results  Component Value Date   GLUCAP 111 (H) 04/25/2022   HGBA1C 5.4 03/16/2022    Review of Glycemic Control  Latest Reference Range & Units 04/24/22 16:54 04/24/22 20:44 04/25/22 00:59 04/25/22 04:53 04/25/22 07:37  Glucose-Capillary 70 - 99 mg/dL 78 88 69 (L) 91 111 (H)  (L): Data is abnormally low (H): Data is abnormally high  Inpatient Diabetes Program Recommendations:   Please consider since steroids discontinued: -D/C Novolog meal coverage  Thank you, Bethena Roys E. Jaiveer Panas, RN, MSN, CDE  Diabetes Coordinator Inpatient Glycemic Control Team Team Pager [phone] (8am-5pm) 04/25/2022 9:54 AM

## 2022-04-25 NOTE — Progress Notes (Signed)
   This pt was referred to hospice services at our inpatient facility. I have offer both Hospice Home in [facility] and [facility].The family prefers [facility] house due to location and closer for family. I have discussed with the wife hospice services and goals are for comfort care. I have reviewed the chart and discussed with our MD as well. She has approved the pt for Hospice care at the [facility]. We unfortunately do not have an available bed to offer at this time. If this changes today I will reach out to [facility] to update them.  Cheir Merrilyn Puma RN [phone]

## 2022-04-25 NOTE — Progress Notes (Signed)
[facility] Progress Note  Patient: Austin Oliver     HQI:[id number]  DOA: 03/23/2022   PCP: [name], MD       Brief hospital course:  66 y.o. 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  Oncology was consulted and recommended moved to [facility] for urgent chemotherapy.  Port-A-Cath was placed on 04/17/2022.  Subjective:  According to his wife, he is not eating any more.  Fentanyl patch does not appear to be helping enough with his pain and he is still requiring IV morphine.  Assessment and Plan: Principal Problem: High-grade B-cell lymphoma ([place]) -Has been transitioned to comfort care today and we are looking for a hospice facility -Start scopolamine patch and increase morphine  Active Problems:  Poor oral intake - Eating less than 50% of his meals  Pancytopenia secondary to chemotherapy  Sacral pressure ulcer - Unstageable pressure ulcer/coccyx crossing the midline  New finding of cirrhosis and splenomegaly - noted on imaging   History of CVA (cerebrovascular accident) and spinal cord infarct Paraplegic     DVT prophylaxis:       Code Status: DNR  Consultants: Oncology Level of Care: Level of care: Med-Surg Disposition Plan:  Status is: Inpatient Remains inpatient appropriate because: Awaiting hospice facility  Vitals:   04/24/22 0802 04/24/22 1329 04/24/22 2222 04/25/22 0632  BP:  (!) 98/56 (!) 94/51 (!) 88/44  Pulse:  81 88 93  Resp:  '18 18 16  '$ Temp:  98.9 F (37.2 C) 97.9 F (36.6 C)  (!) 97.5 F (36.4 C)  TempSrc:  Oral Oral Oral  SpO2: 95% 97% (!) 85% (!) 85%  Weight:      Height:       Filed Weights   03/26/2022 1744 04/23/22 0500  Weight: 93 kg 101.3 kg   Exam: Noted to have congestion in his throat- appears restless due to pain Imaging and lab data was personally reviewed    CBC: Recent Labs  Lab 04/19/22 0500 04/20/22 0550 04/21/22 0605 04/22/22 0609 04/23/22 0520 04/23/22 0909  WBC 9.4 15.1* 11.8* 6.0 1.0* 0.6*  NEUTROABS 8.6* 13.2* 7.9* 5.1  --  0.3*  HGB 9.3* 9.1* 8.5* 7.8* 8.3* 7.9*  HCT 28.8* 28.2* 25.9* 24.8* 25.6* 23.9*  MCV 87.0 87.3 87.5 89.2 88.0 86.9  PLT 93* 97* 88* 75* 56* 44*    Basic Metabolic Panel: Recent Labs  Lab 04/19/22 0500 04/20/22 0550 04/21/22 0605 04/22/22 0609 04/23/22 0520 04/24/22 0610  NA 133* 135 135 133* 133* 131*  K 5.0 5.2* 4.9 3.9 3.7 3.9  CL 102 104 103 99 98 95*  CO2 '25 25 24 25 24 25  '$ GLUCOSE 291* 296* 237* 176* 152* 150*  BUN 59* 61* 61* 57* 65* 72*  CREATININE 0.96 0.92 0.84 0.86 0.99 1.16  CALCIUM 8.3* 8.2* 8.0* 7.4* 7.4* 7.6*  PHOS 4.0 3.5 3.4 4.1  --   --     GFR: Estimated Creatinine Clearance: 72.5 mL/min (by C-G formula based on SCr of 1.16 mg/dL).  Scheduled Meds:  Chlorhexidine Gluconate Cloth  6 each Topical Daily  fentaNYL  1 patch Transdermal Q72H   finasteride  5 mg Oral QHS   gabapentin  400 mg Oral QHS   leptospermum manuka honey  1 application  Topical Daily   rosuvastatin  5 mg Oral Daily   scopolamine  1 patch Transdermal Q72H   sodium chloride flush  10-40 mL Intracatheter Q12H   Continuous Infusions:  HYDROmorphone       LOS: 10 days   Author: Debbe Odea  04/25/2022 1:18 PM

## 2022-04-25 NOTE — Care Management Important Message (Signed)
Important Message  Patient Details IM Letter given to the Patient. Name: Austin Oliver MRN: [id number] Date of Birth: [birth year]   Medicare Important Message Given:  Yes     Kerin Salen 04/25/2022, 10:46 AM

## 2022-04-25 NOTE — TOC Progression Note (Addendum)
Transition of Care [facility]) - Progression Note    Patient Details  Name: [name] MRN: [id number] Date of Birth: Jul 26, 1956  Transition of Care [facility]) CM/SW Contact  Yassmine Tamm, Juliann Pulse, RN Phone Number: 04/25/2022, 1:26 PM  Clinical Narrative:  Ref[facility] for [facility]rep Cheri will eval-await outcome.  -2:30p-Cheri rep reviewing for [facility](no bed today) awaiting confirmation of acceptance by her Physician.    Expected Discharge Plan: [place] Barriers to Discharge: Continued Medical Work up  Expected Discharge Plan and Services Expected Discharge Plan: [place]   Discharge Planning Services: CM Consult Post Acute Care Choice: Hospice Living arrangements for the past 2 months: Single Family Home                                       Social Determinants of Health (SDOH) Interventions    Readmission Risk Interventions     No data to display

## 2022-04-26 DIAGNOSIS — R161 Splenomegaly, not elsewhere classified: Secondary | ICD-10-CM

## 2022-04-26 DIAGNOSIS — K7460 Unspecified cirrhosis of liver: Secondary | ICD-10-CM

## 2022-04-26 DIAGNOSIS — Z950 Presence of cardiac pacemaker: Secondary | ICD-10-CM

## 2022-04-26 LAB — CULTURE, BLOOD (ROUTINE X 2)
Culture: NO GROWTH
Culture: NO GROWTH
Special Requests: ADEQUATE
Special Requests: ADEQUATE

## 2022-04-26 LAB — GLUCOSE, CAPILLARY
Glucose-Capillary: 172 mg/dL — ABNORMAL HIGH (ref 70–99)
Glucose-Capillary: 146 mg/dL — ABNORMAL HIGH (ref 70–99)

## 2022-05-01 ENCOUNTER — Ambulatory Visit: Payer: Medicare Other | Admitting: Adult Health

## 2022-05-08 ENCOUNTER — Inpatient Hospital Stay: Payer: Medicare Other | Admitting: Physician Assistant

## 2022-05-08 ENCOUNTER — Ambulatory Visit: Payer: Medicare Other

## 2022-05-08 ENCOUNTER — Other Ambulatory Visit: Payer: Medicare Other

## 2022-05-10 ENCOUNTER — Ambulatory Visit: Payer: Medicare Other

## 2022-05-17 NOTE — Progress Notes (Addendum)
Daily Progress Note   Patient Name: Austin Oliver       Date: May 13, 2022 DOB: August 20, 1956  Age: 66 y.o. MRN#: [id number] Attending Physician: Austin Odea, MD Primary Care Physician: Austin Downing, MD Admit Date: 03/31/2022 Length of Stay: 11 days  Reason for Consultation/Follow-up: Terminal Care  HPI/Patient Profile:  Palliative Care consult requested for goals of care discussion and symptom management in this 66 y.o. 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 He was admitted on 04/08/2022 to [facility] from Inpatient rehab for chemo induction. Port-A-Cath was placed on 04/17/22.  After initial consult and then follow-up on 6/9 the patient's family admitted recent decline and feeling that he's not strong enough to continue treatment. Patient's previously shared goal of no artifical prolongation was also discussed. At that time they subsequently agreed to shift to comfort care and referral for residential hospice with [facility].  Subjective:   Subjective: Chart Reviewed. Updates received. Patient Assessed. Created space and opportunity for patient  and family to explore thoughts and feelings regarding current medical situation.  Today's Discussion: Today I met with the patient and his family at the bedside.  The patient is not able to meaningfully interact.  He looks peaceful and in no discomfort.  Family agrees have not noted any signs of discomfort or agitation.  The patient appears to be actively dying.  We discussed that in this situation it is beneficial to remain inpatient for end-of-life rather than attempt to transfer  given risk of passing during transit or discomfort due to excessive movement.  They verbalized full agreement and prefer that he remain inpatient.  I provided emotional and general support through therapeutic listening, empathy, and other techniques.  I answered all questions and addressed all concerns to the best of my ability.  Review of Systems  Unable to perform ROS: Acuity of condition    Objective:   Vital Signs:  BP (!) 69/42 (BP Location: Right Arm)   Pulse 88   Temp 99.8 F (37.7 C) (Oral)   Resp 20   Ht _0  (1.753 m)   Wt 101.3 kg   SpO2 (!) 83%   BMI 32.98 kg/m   Physical Exam: Physical Exam Vitals and nursing note reviewed.  Constitutional:      General: He is not in acute distress.    Appearance: He is ill-appearing.  HENT:     Head: Normocephalic and atraumatic.  Pulmonary:     Effort: No respiratory distress.     Comments: Irregular breathing pattern    Palliative Assessment/Data: 10%   Assessment & Plan:   Impression: Present on Admission:  Chronic retention of urine, due to BPH  COPD (chronic obstructive pulmonary disease) (HCC)  CAD (coronary artery disease)  Essential hypertension  Pituitary microadenoma (HCC)  Sick sinus syndrome (HCC)  Spinal cord infarction (HCC)  Splenomegaly  Cirrhosis (HCC)  AAA (abdominal aortic aneurysm) ([place])  Cardiac pacemaker in situ  66 year old male with multiple health issues now in a state of decline and switch to comfort care. Referral made yesterday to [facility] for residential hospice placement. He was approved by Hospice, awaiting bed availability. Today he appears to be actively dying.  I do not feel he would tolerate transfer to residential hospice and would likely need to remain inpatient.  The patient could pass as soon as today.  Family feels that he is comfortable and at peace.  SUMMARY OF RECOMMENDATIONS   Remain DNR Continue comfort care (orders below) Remain inpatient for  end-of-life  Symptom Management:  Continue Fentanyl Duragesic 25 mcg/hr Robinul 0.3 mg IV q 4 hrs prn excessive secretions Dilaudid infusion 0.5-2 mg/hr Dilaudid bolus from infusion 1 mg q 30 mins prn breakthrough pain Ativan 0.5 mg IV q 6 hours prn anxiety Liquifilm tears PRN for dry eyes Haldol PRN for agitation/anxiety May have comfort feeding Unrestricted visitations in the setting of EOL (per policy) Oxygen PRN 2L or less for comfort. No escalation.    Code Status: DNR  Prognosis: Hours - Days  Discharge Planning: Anticipated Hospital Death  Discussed with: Patient's family, medical team, nursing team  Thank you for allowing [place] to participate in the care of Austin Oliver PMT will continue to support holistically.  Billing based on MDM: High  Problems Addressed: One acute or chronic illness or injury that poses a threat to life or bodily function  Amount and/or Complexity of Data: Category 3:Discussion of management or test interpretation with external physician/other qualified health care professional/appropriate source (not separately reported)  Risks: Parenteral controlled substances   Austin Field, NP Palliative Medicine Team  Team Phone # [phone] (Nights/Weekends)  07/16/2021, 8:17 AM

## 2022-05-17 NOTE — Death Summary Note (Signed)
DEATH SUMMARY   Patient Details  Name: Austin Oliver MRN: [id number] DOB: 1956/09/14 ERD:[id number], Curt Jews, MD Admission/Discharge Information   Admit Date:  Apr 16, 2022  Date of Death: Date of Death: Apr 27, 2022  Time of Death: Time of Death: 01/31/44  Length of Stay: 01/27/2023   Principle Cause of death: High-grade B-cell [facility] Diagnoses: Principal Problem:   Spinal cord infarction [facility]) Active Problems:   History of CVA (cerebrovascular accident)   Pituitary microadenoma ([place])   Splenomegaly   Chronic retention of urine, due to BPH   CAD (coronary artery disease)   Essential hypertension   Sick sinus syndrome ([place])   Controlled type 2 diabetes mellitus without complication, without long-term current use of insulin (HCC)   COPD (chronic obstructive pulmonary disease) ([place])   Cirrhosis ([place])   HLD (hyperlipidemia)   Cardiac pacemaker in situ   AAA (abdominal aortic aneurysm) ([place])   B-cell lymphoma [facility])   Hospital Course:  66 y.o. 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  Oncology was consulted and recommended moved to [facility] for urgent chemotherapy.  Port-A-Cath was placed on 04/17/2022 and he completed 1 cycle of chemotherapy.  Unfortunately, after the chemotherapy he continued to have abdominal distention, pain and then oral intake dropped.  Eventually he was barely eating and either in pain or asleep from receiving pain medications.  He was transitioned to comfort care on 6/9.           The results of  significant diagnostics from this hospitalization (including imaging, microbiology, ancillary and laboratory) are listed below for reference.   Significant Diagnostic Studies: US Abdomen Complete  Result Date: 04/21/2022 CLINICAL DATA:  Abdominal pain.  Lymphoma. EXAM: ABDOMEN ULTRASOUND COMPLETE COMPARISON:  CT abdomen 03/29/2022 FINDINGS: Gallbladder: The gallbladder was not visualized at sonography, and accordingly was likely contracted at the time of imaging. Common bile duct: Diameter: 0.4 cm Liver: Nodular contour with heterogeneous echotexture. Index focal hypoechoic mass in the right lobe measures 2.0 by 1.5 by 2.3 cm. Portal vein is patent on color Doppler imaging with normal direction of blood flow towards the liver. IVC: No abnormality visualized. Pancreas: Not visualized due to overlying bowel gas. Spleen: Splenomegaly, 12.9 by 7.8 by 15.8 cm (volume = 830 cm^3). The small hypodense lesions seen on prior CT are not well observed on today's ultrasound. Right Kidney: Length: 10.8 cm. Echogenicity within normal limits. Nonobstructive right renal calculi, largest 1.0 cm in long axis. Left Kidney: Length: 13.0 cm. Echogenicity within normal limits. No mass or hydronephrosis visualized. Abdominal aorta: Infrarenal abdominal aortic aneurysm measuring at 3.1 cm diameter on image 119 of series 1 -1. Other findings: Perihepatic ascites. IMPRESSION: 1. Hepatic cirrhosis. Index focal hypoechoic mass in the right hepatic lobe measures up to 2.3 cm in long axis. There is also some perihepatic ascites which is increased from 03/29/2022. 2. Splenomegaly, splenic volume 830 cc. 3. Gallbladder not well visualized, presumed contracted. 4. Nonobstructive right renal calculi. 5. Infrarenal abdominal aortic aneurysm, 3.1 cm in diameter. Recommend follow-up every 3 years. Reference: J Am Coll Radiol 8185;63:149-702. Electronically Signed  By: Van Clines M.D.   On: 04/21/2022 20:12   IR IMAGING GUIDED PORT  INSERTION  Result Date: 04/17/2022 CLINICAL DATA:  Lymphoma, needs durable venous access for planned treatment regimen EXAM: TUNNELED PORT CATHETER PLACEMENT WITH ULTRASOUND AND FLUOROSCOPIC GUIDANCE FLUOROSCOPY: Radiation Exposure Index (as provided by the fluoroscopic device): 29 mGy air Kerma ANESTHESIA/SEDATION: Intravenous Fentanyl 161mg and Versed '2mg'$  were administered as conscious sedation during continuous monitoring of the patient's level of consciousness and physiological / cardiorespiratory status by the radiology RN, with a total moderate sedation time of 22 minutes. TECHNIQUE: The procedure, risks, benefits, and alternatives were explained to the patient. Questions regarding the procedure were encouraged and answered. The patient understands and consents to the procedure. Patency of the right IJ vein was confirmed with ultrasound with image documentation. An appropriate skin site was determined. Skin site was marked. Region was prepped using maximum barrier technique including cap and mask, sterile gown, sterile gloves, large sterile sheet, and Chlorhexidine as cutaneous antisepsis. The region was infiltrated locally with 1% lidocaine. Under real-time ultrasound guidance, the right IJ vein was accessed with a 21 gauge micropuncture needle; the needle tip within the vein was confirmed with ultrasound image documentation. Needle was exchanged over a 018 guidewire for transitional dilator, and vascular measurement was performed. A small incision was made on the right anterior chest wall and a subcutaneous pocket fashioned. The power-injectable port was positioned and its catheter tunneled to the right IJ dermatotomy site. The transitional dilator was exchanged over an Amplatz wire for a peel-away sheath, through which the port catheter, which had been trimmed to the appropriate length, was advanced and positioned under fluoroscopy with its tip at the cavoatrial junction. Spot chest radiograph confirms  good catheter position and no pneumothorax. The port was flushed per protocol. The pocket was closed with deep interrupted and subcuticular continuous 3-0 Monocryl sutures. The incisions were covered with Dermabond then covered with a sterile dressing. The patient tolerated the procedure well. COMPLICATIONS: COMPLICATIONS None immediate IMPRESSION: Technically successful right IJ power-injectable port catheter placement. Ready for routine use. Electronically Signed   By: DLucrezia EuropeM.D.   On: 04/17/2022 08:00   ECHOCARDIOGRAM COMPLETE  Result Date: 03/17/2022    ECHOCARDIOGRAM REPORT   Patient Name:   Austin BRUMDate of Exam: 03/22/2022 Medical Rec #:  [id number]     Height:       69.0 in Accession #:    [id number]    Weight:       203.0 lb Date of Birth:  201-Jul-1957     BSA:          2.079 m Patient Age:    89years       BP:           112/78 mmHg Patient Gender: M              HR:           66 bpm. Exam Location:  Inpatient Procedure: 2D Echo, Cardiac Doppler, Color Doppler and Strain Analysis Indications:    Chemo  History:        Patient has prior history of Echocardiogram examinations, most                 recent 11/20/2021. COPD; Risk Factors:Hypertension and                 Dyslipidemia. 04/25/21 cath  12/04/20 pacemaker.  Sonographer:    Luisa Hart RDCS Referring Phys: [place]  1. Left ventricular ejection fraction, by estimation, is 55 to 60%. The left ventricle has normal function. The left ventricle has no regional wall motion abnormalities. Left ventricular diastolic parameters were normal.  2. Right ventricular systolic function is normal. The right ventricular size is normal.  3. The mitral valve is normal in structure. Trivial mitral valve regurgitation.  4. The aortic valve is tricuspid. Aortic valve regurgitation is trivial. Aortic valve sclerosis is present, with no evidence of aortic valve stenosis. Comparison(s): The left ventricular function is  unchanged. FINDINGS  Left Ventricle: Left ventricular ejection fraction, by estimation, is 55 to 60%. The left ventricle has normal function. The left ventricle has no regional wall motion abnormalities. The left ventricular internal cavity size was normal in size. There is  no left ventricular hypertrophy. Left ventricular diastolic parameters were normal. Right Ventricle: The right ventricular size is normal. Right vetricular wall thickness was not assessed. Right ventricular systolic function is normal. Left Atrium: Left atrial size was normal in size. Right Atrium: Right atrial size was normal in size. Pericardium: There is no evidence of pericardial effusion. Mitral Valve: The mitral valve is normal in structure. Trivial mitral valve regurgitation. MV peak gradient, 2.2 mmHg. The mean mitral valve gradient is 1.0 mmHg. Tricuspid Valve: The tricuspid valve is normal in structure. Tricuspid valve regurgitation is trivial. Aortic Valve: The aortic valve is tricuspid. Aortic valve regurgitation is trivial. Aortic regurgitation PHT measures 636 msec. Aortic valve sclerosis is present, with no evidence of aortic valve stenosis. Aortic valve mean gradient measures 3.3 mmHg. Aortic valve peak gradient measures 6.7 mmHg. Aortic valve area, by VTI measures 3.28 cm. Pulmonic Valve: The pulmonic valve was grossly normal. Pulmonic valve regurgitation is not visualized. Aorta: The aortic root and ascending aorta are structurally normal, with no evidence of dilitation. IAS/Shunts: No atrial level shunt detected by color flow Doppler.  LEFT VENTRICLE PLAX 2D LVIDd:         5.10 cm     Diastology LVIDs:         3.40 cm     LV e' medial:    7.07 cm/s LV PW:         1.00 cm     LV E/e' medial:  7.4 LV IVS:        1.00 cm     LV e' lateral:   14.00 cm/s LVOT diam:     2.20 cm     LV E/e' lateral: 3.7 LV SV:         79 LV SV Index:   38 LVOT Area:     3.80 cm  LV Volumes (MOD) LV vol d, MOD A2C: 42.3 ml LV vol d, MOD A4C: 99.0 ml  LV vol s, MOD A2C: 18.9 ml LV vol s, MOD A4C: 39.8 ml LV SV MOD A2C:     23.4 ml LV SV MOD A4C:     99.0 ml LV SV MOD BP:      36.8 ml RIGHT VENTRICLE RV Basal diam:  3.00 cm RV Mid diam:    3.00 cm RV S prime:     13.60 cm/s TAPSE (M-mode): 1.3 cm LEFT ATRIUM           Index        RIGHT ATRIUM           Index LA diam:  3.40 cm 1.64 cm/m   RA Area:     13.60 cm LA Vol (A4C): 31.1 ml 14.96 ml/m  RA Volume:   33.00 ml  15.87 ml/m  AORTIC VALVE                    PULMONIC VALVE AV Area (Vmax):    3.27 cm     PV Vmax:       1.38 m/s AV Area (Vmean):   3.04 cm     PV Vmean:      87.300 cm/s AV Area (VTI):     3.28 cm     PV VTI:        0.258 m AV Vmax:           129.00 cm/s  PV Peak grad:  7.7 mmHg AV Vmean:          84.400 cm/s  PV Mean grad:  3.5 mmHg AV VTI:            0.241 m AV Peak Grad:      6.7 mmHg AV Mean Grad:      3.3 mmHg LVOT Vmax:         111.00 cm/s LVOT Vmean:        67.450 cm/s LVOT VTI:          0.208 m LVOT/AV VTI ratio: 0.86 AI PHT:            636 msec  AORTA Ao Root diam: 3.50 cm Ao Asc diam:  2.80 cm MITRAL VALVE               TRICUSPID VALVE MV Area (PHT): 3.03 cm    TR Peak grad:   25.0 mmHg MV Area VTI:   3.44 cm    TR Vmax:        250.00 cm/s MV Peak grad:  2.2 mmHg MV Mean grad:  1.0 mmHg    SHUNTS MV Vmax:       0.75 m/s    Systemic VTI:  0.21 m MV Vmean:      45.9 cm/s   Systemic Diam: 2.20 cm MV Decel Time: 250 msec MV E velocity: 52.30 cm/s MV A velocity: 73.30 cm/s MV E/A ratio:  0.71 Dorris Carnes MD Electronically signed by Dorris Carnes MD Signature Date/Time: 04/08/2022/12:11:06 PM    Final    MR Lumbar Spine W Wo Contrast  Result Date: 04/12/2022 CLINICAL DATA:  Spinal cord injury. Follow-up. Worsening numbness and weakness. EXAM: MRI LUMBAR SPINE WITHOUT AND WITH CONTRAST TECHNIQUE: Multiplanar and multiecho pulse sequences of the lumbar spine were obtained without and with intravenous contrast. Patient has conditional pacemaker and was scanned according to safety  guidelines. CONTRAST:  9.77m GADAVIST GADOBUTROL 1 MMOL/ML IV SOLN COMPARISON:  MRI lumbar spine 03/28/2022; CT chest, abdomen, and pelvis 03/29/2022 FINDINGS: Segmentation: There is again partial sacralization of L5. Rudimentary disc at L5-S1. Alignment:  Trace retrolisthesis of L4 on L5, unchanged. Vertebrae: Vertebral body heights are maintained. Mild superior L2 endplate degenerative Schmorl's node. There is again decreased T1 signal and subtle increased T2/stir signal and possible enhancement seen within the posterior L3 and L4 vertebral bodies and more diffusely within the L5 and S1 vertebral bodies. Conus medullaris and cauda equina: Conus extends to the superior L1 level. Conus and cauda equina appear normal. Paraspinal and other soft tissues: There again borderline enlarged para-aortic retroperitoneal lymph nodes. A right lower pole renal 11 mm likely cyst with layering calcification is seen,  as on prior CT. Borderline infrarenal abdominal aortic aneurysm. Disc levels: L1-2: Mild bilateral facet joint hypertrophy. Mild broad-based posterior disc osteophyte complex with mild bilateral intraforaminal extension. Mild-to-moderate bilateral neuroforaminal stenosis. L2-3: Mild bilateral facet joint hypertrophy. Mild-to-moderate broad-based posterior disc osteophyte complex. Mild-to-moderate bilateral neuroforaminal stenosis. L3-4: Mild bilateral facet joint hypertrophy. Mild to moderate broad-based posterior disc osteophyte complex. Moderate left and mild right neuroforaminal stenosis. Mild narrowing of the lateral recesses and mild central canal stenosis. L4-5: Mild-to-moderate bilateral facet joint hypertrophy. Posterior disc bulge with midline posterior disc protrusion. No significant central canal stenosis. No significant neuroforaminal stenosis. L5-S1: Rudimentary disc. No posterior disc bulge, central canal narrowing, or neuroforaminal stenosis. IMPRESSION: 1. No significant change from 03/28/2022. 2.  Probable bone marrow signal abnormality within the L3 through S1 vertebral bodies, concerning for metastatic disease. 3. Multilevel degenerative disc and joint changes as above. Electronically Signed   By: Yvonne Kendall M.D.   On: 04/12/2022 19:03   MR THORACIC SPINE W WO CONTRAST  Result Date: 04/12/2022 CLINICAL DATA:  Spinal cord injury. Follow-up. Worsening weakness and numbness in lower extremities. Known cancer. Known spinal cord infarct. EXAM: MRI THORACIC WITHOUT AND WITH CONTRAST TECHNIQUE: Multiplanar and multiecho pulse sequences of the thoracic spine were obtained without and with intravenous contrast. This patient has a conditional pacemaker and was scanned according to safety guidelines. CONTRAST:  9.30m GADAVIST GADOBUTROL 1 MMOL/ML IV SOLN COMPARISON:  CT chest, abdomen, and pelvis 03/29/2022; MRI thoracic spine 03/28/2022 FINDINGS: Alignment:  No sagittal spondylolisthesis. Vertebrae: Vertebral body heights are maintained. There is again abnormal decreased T1 increased T2/stir signal and diffuse enhancement seen throughout the majority of the T4 vertebral body. There also appears to be decreased T1 and increased T2/STIR signal with enhancement within the C7 and T1 vertebral bodies, similar to 03/28/2022 MRI. Small fat intensity hemangioma within the T8 vertebral body is unchanged. Cord: There is again abnormal increased T2/STIR signal within the T3-4 through T4-5 levels of the cord. This may be slightly improved from prior. The cord now appears to be normal in caliber with resolution of the prior possible minimal enlargement. No definite cord enhancement is seen. The conus terminates at the superior L1 level. Paraspinal and other soft tissues: There is again thickening and nodularity within the bilateral adrenal glands. Partially visualized increased T2 signal lesion within the posterior right hepatic lobe likely corresponding to one of numerable lesion seen on 03/29/2022 CT.11 mm short axis  lymph node is seen to the right of the descending thoracic aorta (axial series 22, image 20). Given the diffuse lymphadenopathy on prior CT this also remains concerning for malignant involvement. Disc levels: C6-7: Partially visualized on sagittal images only. Mild-to-moderate posterior disc osteophyte complex. T3-4: Mild midline posterior disc protrusion with mild cephalad extension unchanged. No central canal or neuroforaminal stenosis. T4-5: Facet joint hypertrophy contributes to mild bilateral neuroforaminal stenosis. T5-6: Mild broad-based posterior disc bulge with mild left intraforaminal extension and mild-to-moderate left neuroforaminal stenosis, unchanged. T9-10: Minimal posterior disc bulge with mild bilateral intraforaminal extension. Mild bilateral facet joint hypertrophy. Mild bilateral neuroforaminal stenosis. T10-11: Facet joint hypertrophy contributes to mild right neuroforaminal stenosis. IMPRESSION: 1. There is again abnormal increased T2 signal within the central aspect of the cord fairly diffusely at the T3 and T4 levels. There appears to be resolution of the prior mild cord swelling/enlargement. Findings again may be secondary to cord ischemia or transverse myelitis. Again no definite enhancement is seen to suggest a neoplastic process. 2. There is again abnormal  decreased T1 increased T2/STIR signal with enhancement within the C7, T1, and T4 vertebral bodies that is suspicious for metastatic disease. Note is made metastases were suggested within the liver, adrenals, and abdominal lymph nodes on 03/29/2022 CT. Electronically Signed   By: Yvonne Kendall M.D.   On: 04/12/2022 18:50   DG Abd 1 View  Result Date: 04/12/2022 CLINICAL DATA:  Nausea and vomiting. EXAM: ABDOMEN - 1 VIEW COMPARISON:  04/06/2022 FINDINGS: Bowel gas pattern appears nonobstructed. No dilated loops of small bowel identified. Right renal stone is again noted within the expected location of the inferior pole measuring 1.1  cm. Status post left hip arthroplasty. IMPRESSION: 1. Nonobstructive bowel gas pattern. 2. Right renal stone. Electronically Signed   By: Kerby Moors M.D.   On: 04/12/2022 11:23   CT HEAD WO CONTRAST (5MM)  Result Date: 04/09/2022 CLINICAL DATA:  Mental status change.  History of stroke. EXAM: CT HEAD WITHOUT CONTRAST TECHNIQUE: Contiguous axial images were obtained from the base of the skull through the vertex without intravenous contrast. RADIATION DOSE REDUCTION: This exam was performed according to the departmental dose-optimization program which includes automated exposure control, adjustment of the mA and/or kV according to patient size and/or use of iterative reconstruction technique. COMPARISON:  CT head 03/25/2022.  MRI head 03/27/2022 FINDINGS: Brain: Patchy hypodensities in the cerebral white matter bilaterally, left greater than right are stable. No new area of infarct, hemorrhage, mass Vascular: Negative for hyperdense vessel Skull: Negative Sinuses/Orbits: Mild mucosal edema paranasal sinuses. Negative orbit Other: None IMPRESSION: No acute abnormality and no change from recent studies. Bilateral white matter hypodensities left greater than right are stable. Electronically Signed   By: Franchot Gallo M.D.   On: 04/09/2022 17:09   IR US Guide Bx Asp/Drain  Result Date: 04/07/2022 INDICATION: Multiple hepatic lesions EXAM: Ultrasound-guided biopsy of left liver mass MEDICATIONS: None. ANESTHESIA/SEDATION: Moderate (conscious) sedation was employed during this procedure. A total of Versed 1 mg and Fentanyl 50 mcg was administered intravenously by the radiology nurse. Total intra-service moderate Sedation Time: 10 minutes. The patient's level of consciousness and vital signs were monitored continuously by radiology nursing throughout the procedure under my direct supervision. COMPLICATIONS: None immediate. PROCEDURE: Informed written consent was obtained from the patient after a thorough  discussion of the procedural risks, benefits and alternatives. All questions were addressed. Maximal Sterile Barrier Technique was utilized including caps, mask, sterile gowns, sterile gloves, sterile drape, hand hygiene and skin antiseptic. A timeout was performed prior to the initiation of the procedure. Patient position supine on the ultrasound table. Epigastric skin prepped and draped in usual sterile fashion. Following local lidocaine administration, 17 gauge introducer needle was advanced into 1 of the left hepatic lobe lesions, and 4- 18 gauge cores were obtained utilizing continuous ultrasound guidance. Gelfoam slurry was administered through the introducer needle at the biopsy site. Samples were sent to pathology in formalin. Needle removed and hemostasis achieved with 5 minutes of manual compression. Post procedure ultrasound images showed no evidence of significant hemorrhage. IMPRESSION: Ultrasound-guided biopsy of left liver mass as above. Electronically Signed   By: Miachel Roux M.D.   On: 04/07/2022 16:20   DG Abd 1 View  Result Date: 04/06/2022 CLINICAL DATA:  Abdominal pain EXAM: ABDOMEN - 1 VIEW COMPARISON:  04/04/2022 FINDINGS: There remains residual contrast within the rectosigmoid. Stool is present throughout the colon. No dilated loops. No acute osseous abnormality. Left hip arthroplasty. IMPRESSION: Persistent evidence of constipation. Electronically Signed   By: Malachi Carl  Patel M.D.   On: 04/06/2022 13:02   DG Abd Portable 1V  Result Date: 04/04/2022 CLINICAL DATA:  Constipation, initial encounter EXAM: PORTABLE ABDOMEN - 1 VIEW COMPARISON:  12/30/2021 FINDINGS: Previously administered contrast now lies within the left colon. Retained fecal material is seen without obstructive change. No free air is noted. No acute bony abnormality is noted. IMPRESSION: Changes consistent with colonic constipation. Previously administered contrast remains within the colon 6 days later. Electronically  Signed   By: Inez Catalina M.D.   On: 04/04/2022 19:04   CT CHEST ABDOMEN PELVIS W CONTRAST  Result Date: 03/29/2022 CLINICAL DATA:  Possible metastatic disease within the thoracolumbar spine EXAM: CT CHEST, ABDOMEN, AND PELVIS WITH CONTRAST TECHNIQUE: Multidetector CT imaging of the chest, abdomen and pelvis was performed following the standard protocol during bolus administration of intravenous contrast. RADIATION DOSE REDUCTION: This exam was performed according to the departmental dose-optimization program which includes automated exposure control, adjustment of the mA and/or kV according to patient size and/or use of iterative reconstruction technique. CONTRAST:  146m OMNIPAQUE IOHEXOL 300 MG/ML  SOLN COMPARISON:  03/27/2022, 03/28/2022, 10/08/2021 FINDINGS: CT CHEST FINDINGS Cardiovascular: The heart and great vessels are unremarkable without pericardial effusion. Dual lead pacer unchanged. No evidence of thoracic aortic aneurysm or dissection. Stable atherosclerosis of the aorta and coronary vasculature. Mediastinum/Nodes: No enlarged mediastinal, hilar, or axillary lymph nodes. Thyroid gland, trachea, and esophagus demonstrate no significant findings. Lungs/Pleura: Bilateral partially calcified less than 4 mm pulmonary nodules are unchanged since prior study consistent with granulomas. No new pulmonary nodules or masses. No acute airspace disease, effusion, or pneumothorax. Central airways are patent. Musculoskeletal: There are no acute displaced fractures. The abnormality of the T4 vertebral body seen on prior thoracic spine MRI is not apparent by CT. No destructive bony lesions. Reconstructed images demonstrate no additional findings. CT ABDOMEN PELVIS FINDINGS Hepatobiliary: There are multiple hypodense lesions seen throughout the liver, consistent with metastatic disease. Index lesion right lobe image 50/3 measures 2.0 x 1.6 cm. No biliary duct dilation. Small gallstone within the gallbladder neck  without evidence of acute cholecystitis. Pancreas: Unremarkable. No pancreatic ductal dilatation or surrounding inflammatory changes. Spleen: Spleen is enlarged measuring 15.7 by 15.6 x 5.8 cm. The splenic parenchyma is heterogeneous, with multiple hypodensities measuring up to 1.3 cm reference image 62. Adrenals/Urinary Tract: Bilateral adrenal thickening, measuring up to 1.3 cm on the right and 1.5 cm on the left, suspicious for metastatic disease given the additional findings. The kidneys enhance normally and symmetrically. Nonobstructing 13 mm calculus mid right kidney. No obstructive uropathy within either kidney. Bladder is only minimally distended, with nonspecific bladder wall thickening. Foley catheter is identified, with small amount of gas in the bladder lumen consistent with catheterization. Stomach/Bowel: No bowel obstruction or ileus. Normal appendix right lower quadrant. No bowel wall thickening or inflammatory change. Vascular/Lymphatic: Multiple enlarged lymph nodes within the upper abdomen and retroperitoneum. 14 mm short axis lymph node within the gastrohepatic ligament image 57/3. Retroperitoneal adenopathy, with index lymph node measuring 13 mm in short axis in the aortocaval region image 80/3. 3.3 cm infrarenal abdominal aortic aneurysm. Moderate atherosclerosis throughout the aorta and its branches. Reproductive: Prostate is unremarkable. Other: No free fluid or free intraperitoneal gas. No abdominal wall hernia. Musculoskeletal: Unremarkable left hip arthroplasty. 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 There are no acute displaced fractures. The abnormalities of the L3 through S1 vertebral body seen on prior MRI are not readily apparent by CT.  No destructive bony lesions are identified. Reconstructed images demonstrate no additional findings. IMPRESSION: 1. The suspected metastatic disease within the T4 vertebral body and L3 through S1 vertebral  bodies on prior MRI is occult by CT. No destructive bony lesions. 2. Numerous hypodensities throughout the liver consistent with metastatic disease. 3. Splenomegaly, with indeterminate hypodensities consistent with metastases. 4. Bilateral adrenal thickening, suspicious for adrenal metastases. 5. Diffuse lymphadenopathy within the upper abdomen and retroperitoneum, consistent with metastatic disease or lymphoma. 6. 3.3 cm infrarenal abdominal aortic aneurysm. Recommend follow-up every 3 years. Reference: J Am Coll Radiol 9233;[id number]:[id number]. 7. No acute intrathoracic process. Bilateral sub 4 mm pulmonary nodules are partially calcified, consistent with benign nodules based on stability and imaging characteristics. 8. 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 Electronically Signed   By: Randa Ngo M.D.   On: 03/29/2022 19:46   MR THORACIC SPINE W WO CONTRAST  Result Date: 03/28/2022 CLINICAL DATA:  Acute myelopathy. EXAM: MRI THORACIC AND LUMBAR SPINE WITHOUT AND WITH CONTRAST TECHNIQUE: Multiplanar and multiecho pulse sequences of the thoracic and lumbar spine were obtained without and with intravenous contrast. CONTRAST:  9.80m GADAVIST GADOBUTROL 1 MMOL/ML IV SOLN COMPARISON:  MRI cervical spine without contrast. FINDINGS: MRI THORACIC SPINE FINDINGS Alignment:  Normal Vertebrae: Abnormal T1 and T2 signal intensity in the T4 vertebral body along with contrast enhancement. Possible T1 signal abnormality in the T1 vertebral body also. Could not exclude metastatic disease. Patient may metastatic workup. Cord: Cord signal abnormality in the upper thoracic cord extending from the T3-4 disc space to the bottom of T5. Cord is also slightly thickened in this area. I do not see any definite evidence of hemorrhage. No obvious enhancement to suggest a neoplastic process. This could be ischemic change or possibly transverse myelitis. The remainder of the thoracic cord is. Conus medullaris  terminates at L1. Paraspinal and other soft tissues: No obvious lung mass or mediastinal/hilar mass or adenopathy. The aorta is grossly normal. In the upper abdomen there appear to be bilateral adrenal gland lesions and a very prominent caudate lobe of the liver which could suggest cirrhosis. Disc levels: No focal thoracic disc protrusions, spinal or foraminal stenosis. MRI LUMBAR SPINE FINDINGS Segmentation: There is partial sacralization of L5 with small disc space. Alignment:  Normal Vertebrae: Abnormal T1 signal intensity posterior aspect L3 and L4 and and most of the L5 and S1 vertebral bodies. Contrast enhancement is also demonstrated. Conus medullaris: Extends to the L1 level and appears normal. Paraspinal and other soft tissues: Borderline retroperitoneal lymph nodes. Bilateral adrenal lesions. No aortic aneurysm. Disc levels: No significant disc protrusions, spinal or foraminal stenosis. There is a bulging degenerated annulus at L4-5 along with a shallow central disc protrusion. Moderate multilevel facet disease. IMPRESSION: 1. Abnormal cord signal intensity at the T3 and T4 levels. Findings suggest cord ischemia or other process such as transverse myelitis. No enhancement to suggest neoplastic process. 2. There are several areas of abnormal T1 marrow signal and contrast enhancement worrisome for metastatic disease or lymphoma. Patient may need metastatic workup. There are also bilateral adrenal gland lesions and borderline retroperitoneal lymph nodes. 3. Electronically Signed   By: PMarijo SanesM.D.   On: 03/28/2022 18:13   MR Lumbar Spine W Wo Contrast  Result Date: 03/28/2022 CLINICAL DATA:  Acute myelopathy. EXAM: MRI THORACIC AND LUMBAR SPINE WITHOUT AND WITH CONTRAST TECHNIQUE: Multiplanar and multiecho pulse sequences of the thoracic and lumbar spine were obtained without and with intravenous contrast. CONTRAST:  9.549m  GADAVIST GADOBUTROL 1 MMOL/ML IV SOLN COMPARISON:  MRI cervical spine without  contrast. FINDINGS: MRI THORACIC SPINE FINDINGS Alignment:  Normal Vertebrae: Abnormal T1 and T2 signal intensity in the T4 vertebral body along with contrast enhancement. Possible T1 signal abnormality in the T1 vertebral body also. Could not exclude metastatic disease. Patient may metastatic workup. Cord: Cord signal abnormality in the upper thoracic cord extending from the T3-4 disc space to the bottom of T5. Cord is also slightly thickened in this area. I do not see any definite evidence of hemorrhage. No obvious enhancement to suggest a neoplastic process. This could be ischemic change or possibly transverse myelitis. The remainder of the thoracic cord is. Conus medullaris terminates at L1. Paraspinal and other soft tissues: No obvious lung mass or mediastinal/hilar mass or adenopathy. The aorta is grossly normal. In the upper abdomen there appear to be bilateral adrenal gland lesions and a very prominent caudate lobe of the liver which could suggest cirrhosis. Disc levels: No focal thoracic disc protrusions, spinal or foraminal stenosis. MRI LUMBAR SPINE FINDINGS Segmentation: There is partial sacralization of L5 with small disc space. Alignment:  Normal Vertebrae: Abnormal T1 signal intensity posterior aspect L3 and L4 and and most of the L5 and S1 vertebral bodies. Contrast enhancement is also demonstrated. Conus medullaris: Extends to the L1 level and appears normal. Paraspinal and other soft tissues: Borderline retroperitoneal lymph nodes. Bilateral adrenal lesions. No aortic aneurysm. Disc levels: No significant disc protrusions, spinal or foraminal stenosis. There is a bulging degenerated annulus at L4-5 along with a shallow central disc protrusion. Moderate multilevel facet disease. IMPRESSION: 1. Abnormal cord signal intensity at the T3 and T4 levels. Findings suggest cord ischemia or other process such as transverse myelitis. No enhancement to suggest neoplastic process. 2. There are several areas of  abnormal T1 marrow signal and contrast enhancement worrisome for metastatic disease or lymphoma. Patient may need metastatic workup. There are also bilateral adrenal gland lesions and borderline retroperitoneal lymph nodes. 3. Electronically Signed   By: Marijo Sanes M.D.   On: 03/28/2022 18:13    Microbiology: Recent Results (from the past 240 hour(s))  Culture, blood (Routine X 2) w Reflex to ID Panel     Status: None   Collection Time: 04/21/22  9:29 AM   Specimen: BLOOD  Result Value Ref Range Status   Specimen Description   Final    BLOOD LEFT ANTECUBITAL Performed at [facility] [address], [place], [place] [address]    Special Requests   Final    IN PEDIATRIC BOTTLE Blood Culture adequate volume Performed at [facility] [address] [place], [place] [address]    Culture   Final    NO GROWTH 5 DAYS Performed at [facility], [place] [address] [place], [place] [address]    Report Status 05/24/22 FINAL  Final  Culture, blood (Routine X 2) w Reflex to ID Panel     Status: None   Collection Time: 04/21/22  9:44 AM   Specimen: BLOOD  Result Value Ref Range Status   Specimen Description   Final    BLOOD BLOOD RIGHT HAND Performed at [facility] [address] [place], [place] [address]    Special Requests   Final    IN PEDIATRIC BOTTLE Blood Culture adequate volume Performed at [facility] [address] [place], [place] [address]    Culture   Final    NO GROWTH  5 DAYS Performed at [facility], [place] [address] [place], [place] [address]    Report Status May 18, 2022 FINAL  Final    Time spent: 35 minutes  Signed: Debbe Odea, MD 05/18/22

## 2022-05-17 NOTE — Progress Notes (Signed)
[facility] Progress Note  Patient: Austin Oliver     GTX:[id number]  DOA: 04/01/2022   PCP: [name], MD       Brief hospital course:  66 y.o. 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  Oncology was consulted and recommended moved to [facility] for urgent chemotherapy.  Port-A-Cath was placed on 04/17/2022 and he completed 1 cycle of chemotherapy.  Unfortunately, after the chemotherapy he continued to have abdominal distention, pain and then oral intake dropped.  Eventually he was barely eating and either in pain or asleep from receiving pain medications.  He was transitioned to comfort care on 6/9.  Subjective:  The patient is on Dilaudid infusion and appears comfortable.  His wife is at bedside and is thankful for his care.  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:       Code Status: DNR  Consultants: Oncology Level of Care: Level of care: Med-Surg Disposition Plan:  Status is: Inpatient Remains inpatient appropriate because: Awaiting hospice  facility  Vitals:   04/24/22 1329 04/24/22 2222 04/25/22 0632 May 07, 2022 0614  BP: (!) 98/56 (!) 94/51 (!) 88/44 (!) 69/42  Pulse: 81 88 93 88  Resp: '18 18 16 20  '$ Temp: 98.9 F (37.2 C) 97.9 F (36.6 C) (!) 97.5 F (36.4 C) 99.8 F (37.7 C)  TempSrc: Oral Oral Oral Oral  SpO2: 97% (!) 85% (!) 85% (!) 83%  Weight:      Height:       Filed Weights   03/26/2022 1744 04/23/22 0500  Weight: 93 kg 101.3 kg   Exam: Noted to have congestion in his throat- appears restless due to pain Imaging and lab data was personally reviewed    CBC: Recent Labs  Lab 04/20/22 0550 04/21/22 0605 04/22/22 0609 04/23/22 0520 04/23/22 0909  WBC 15.1* 11.8* 6.0 1.0* 0.6*  NEUTROABS 13.2* 7.9* 5.1  --  0.3*  HGB 9.1* 8.5* 7.8* 8.3* 7.9*  HCT 28.2* 25.9* 24.8* 25.6* 23.9*  MCV 87.3 87.5 89.2 88.0 86.9  PLT 97* 88* 75* 56* 44*    Basic Metabolic Panel: Recent Labs  Lab 04/20/22 0550 04/21/22 0605 04/22/22 0609 04/23/22 0520 04/24/22 0610  NA 135 135 133* 133* 131*  K 5.2* 4.9 3.9 3.7 3.9  CL 104 103 99 98 95*  CO2 '25 24 25 24 25  '$ GLUCOSE 296* 237* 176* 152* 150*  BUN 61* 61* 57* 65* 72*  CREATININE 0.92 0.84 0.86 0.99 1.16  CALCIUM 8.2* 8.0* 7.4* 7.4* 7.6*  PHOS 3.5 3.4 4.1  --   --     GFR: Estimated Creatinine Clearance: 72.5 mL/min (by C-G formula based on SCr of 1.16 mg/dL).  Scheduled Meds:  fentaNYL  1 patch Transdermal Q72H   leptospermum manuka honey  1 application  Topical Daily   scopolamine  1 patch Transdermal Q72H   sodium chloride flush  10-40 mL Intracatheter Q12H   Continuous Infusions:  HYDROmorphone 1.5 mg/hr (04/25/22 2023)     LOS: 11 days   Author: Debbe Odea  07-May-2022 2:11 PM

## 2022-05-17 NOTE — Plan of Care (Signed)
  Problem: Education: Goal: Knowledge of General Education information will improve Description: Including pain rating scale, medication(s)/side effects and non-pharmacologic comfort measures Outcome: Not Progressing   Problem: Health Behavior/Discharge Planning: Goal: Ability to manage health-related needs will improve Outcome: Not Progressing   Problem: Clinical Measurements: Goal: Ability to maintain clinical measurements within normal limits will improve Outcome: Not Progressing Goal: Will remain free from infection Outcome: Not Progressing Goal: Diagnostic test results will improve Outcome: Not Progressing Goal: Respiratory complications will improve Outcome: Not Progressing Goal: Cardiovascular complication will be avoided Outcome: Not Progressing   Problem: Activity: Goal: Risk for activity intolerance will decrease Outcome: Not Progressing   Problem: Nutrition: Goal: Adequate nutrition will be maintained Outcome: Not Progressing   Problem: Coping: Goal: Level of anxiety will decrease Outcome: Not Progressing   Problem: Elimination: Goal: Will not experience complications related to bowel motility Outcome: Not Progressing Goal: Will not experience complications related to urinary retention Outcome: Not Progressing   Problem: Pain Managment: Goal: General experience of comfort will improve Outcome: Not Progressing   Problem: Safety: Goal: Ability to remain free from injury will improve Outcome: Not Progressing   Problem: Skin Integrity: Goal: Risk for impaired skin integrity will decrease Outcome: Not Progressing   Problem: Education: Goal: Ability to describe self-care measures that may prevent or decrease complications (Diabetes Survival Skills Education) will improve Outcome: Not Progressing Goal: Individualized Educational Video(s) Outcome: Not Progressing   Problem: Coping: Goal: Ability to adjust to condition or change in health will  improve Outcome: Not Progressing   Problem: Fluid Volume: Goal: Ability to maintain a balanced intake and output will improve Outcome: Not Progressing   Problem: Health Behavior/Discharge Planning: Goal: Ability to identify and utilize available resources and services will improve Outcome: Not Progressing Goal: Ability to manage health-related needs will improve Outcome: Not Progressing   Problem: Metabolic: Goal: Ability to maintain appropriate glucose levels will improve Outcome: Not Progressing   Problem: Nutritional: Goal: Maintenance of adequate nutrition will improve Outcome: Not Progressing Goal: Progress toward achieving an optimal weight will improve Outcome: Not Progressing   Problem: Tissue Perfusion: Goal: Adequacy of tissue perfusion will improve Outcome: Not Progressing

## 2022-05-17 DEATH — deceased

## 2022-05-29 ENCOUNTER — Ambulatory Visit: Payer: Medicare Other | Admitting: Internal Medicine

## 2022-05-29 ENCOUNTER — Ambulatory Visit: Payer: Medicare Other

## 2022-05-29 ENCOUNTER — Other Ambulatory Visit: Payer: Medicare Other

## 2022-05-31 ENCOUNTER — Ambulatory Visit: Payer: Medicare Other

## 2022-06-03 ENCOUNTER — Ambulatory Visit: Payer: Medicare Other | Admitting: Pulmonary Disease

## 2022-06-19 ENCOUNTER — Other Ambulatory Visit: Payer: Medicare Other

## 2022-06-19 ENCOUNTER — Ambulatory Visit: Payer: Medicare Other | Admitting: Physician Assistant

## 2022-06-19 ENCOUNTER — Ambulatory Visit: Payer: Medicare Other

## 2022-06-21 ENCOUNTER — Ambulatory Visit: Payer: Medicare Other

## 2023-05-31 IMAGING — CT CT HEAD W/O CM
3 series · 15 of 47 positions shown, 18 images · non-contrast
Comparison: None.

CLINICAL DATA: 65-year-old male with multiple recent falls. Left
leg weakness.

EXAM:
CT HEAD WITHOUT CONTRAST
TECHNIQUE: Contiguous axial images were obtained from the base of the skull
through the vertex without intravenous contrast.

[Series 2: head wo · axial · 0.47mm/px · z∈[-131,-6]mm · 9 of 31 slices shown, 12 images]
[im 3/31  brain]
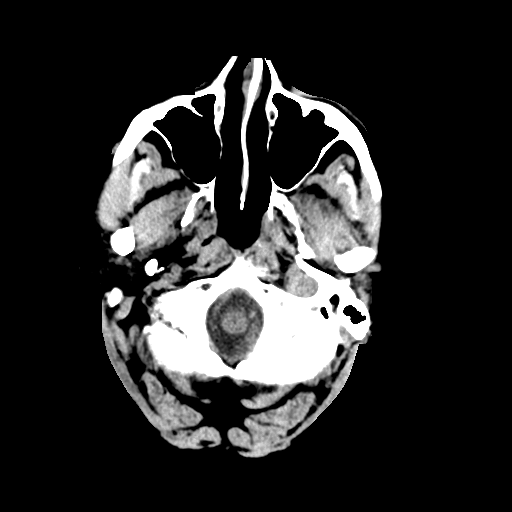
[im 3/31  bone]
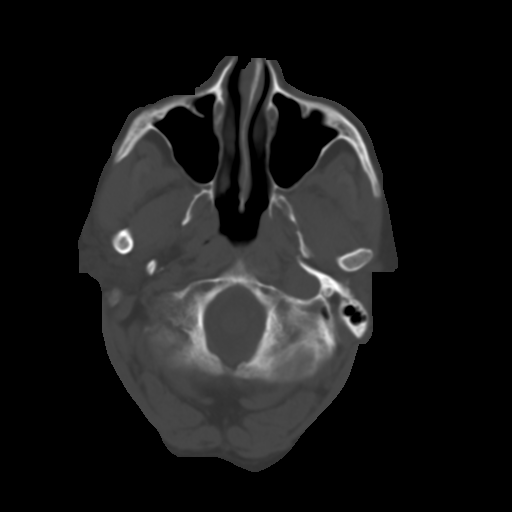
[im 6/31  brain]
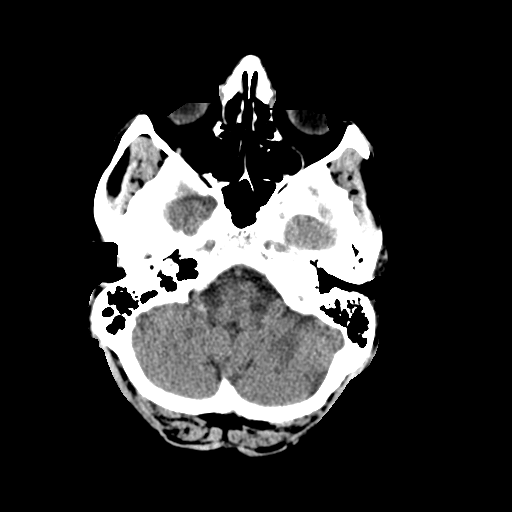
[im 9/31  brain]
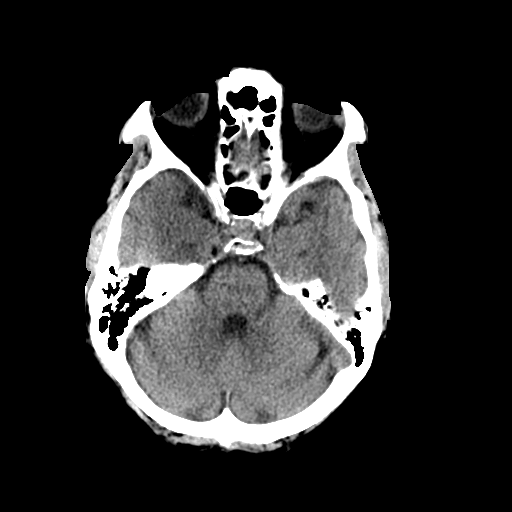
[im 12/31  brain]
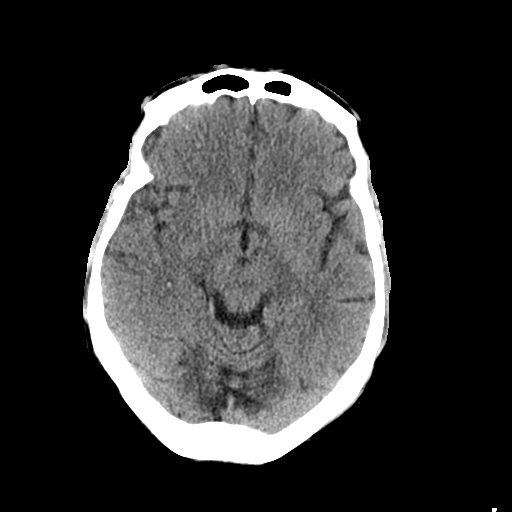
[im 16/31  brain]
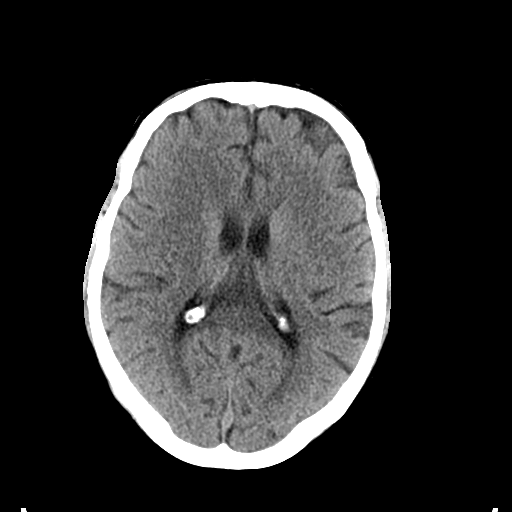
[im 16/31  bone]
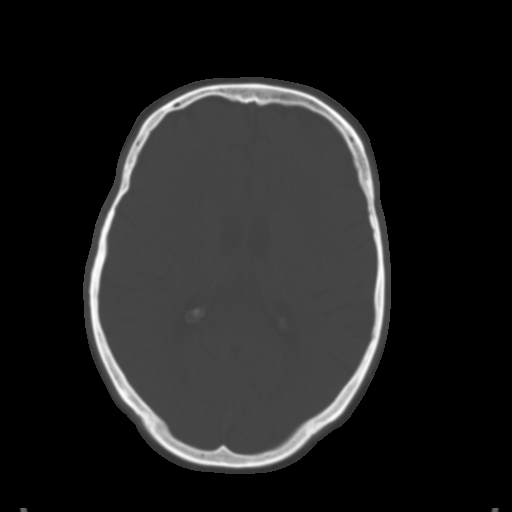
[im 19/31  brain]
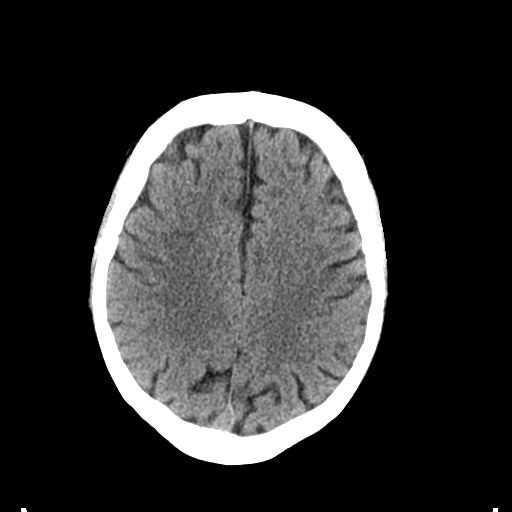
[im 22/31  brain]
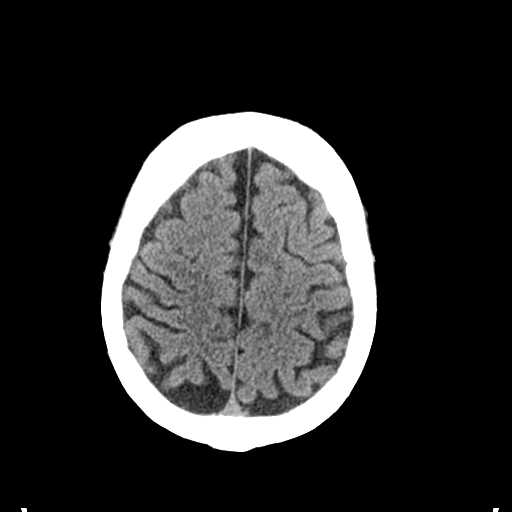
[im 25/31  brain]
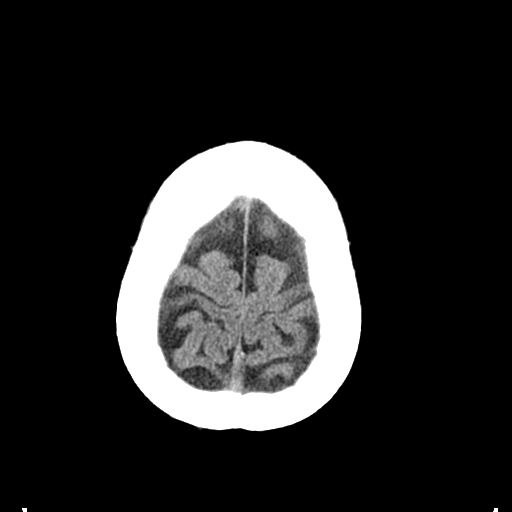
[im 28/31  brain]
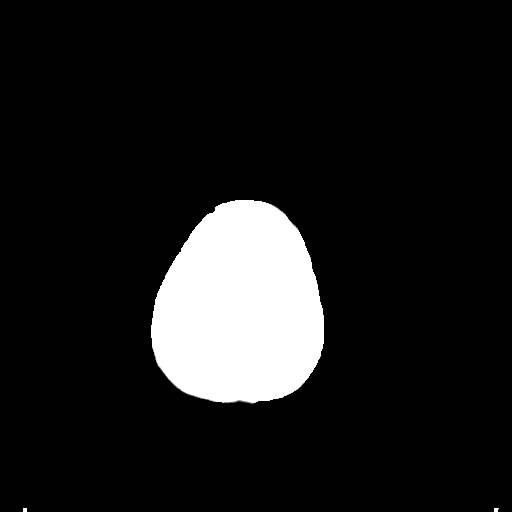
[im 28/31  bone]
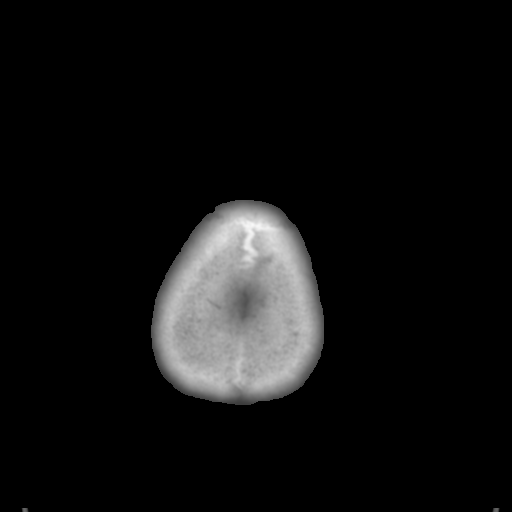

[Series 4: coronal soft tissue · coronal · 0.30mm/px · 3 of 64 slices shown]
[im 22/64  brain]
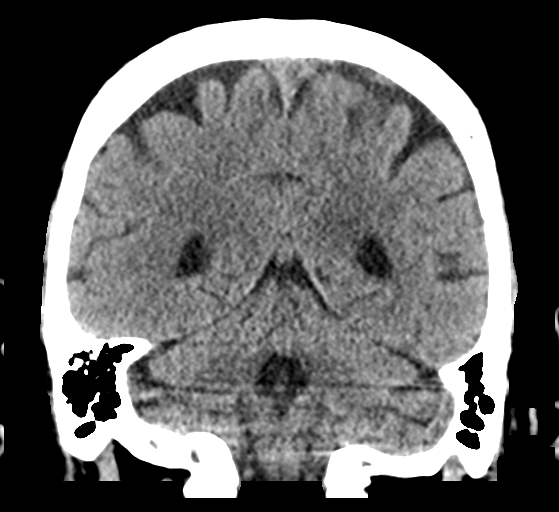
[im 29/64  brain]
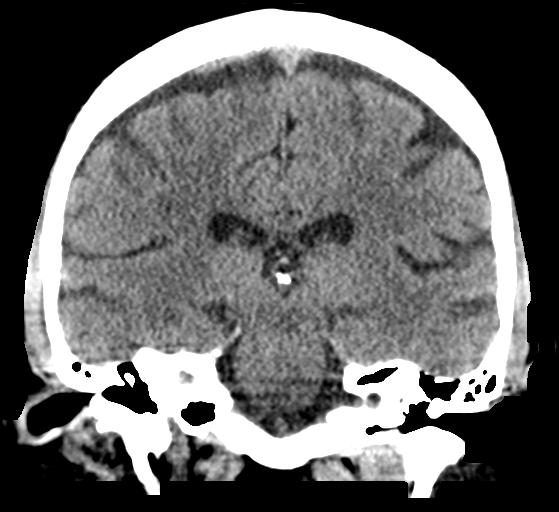
[im 36/64  brain]
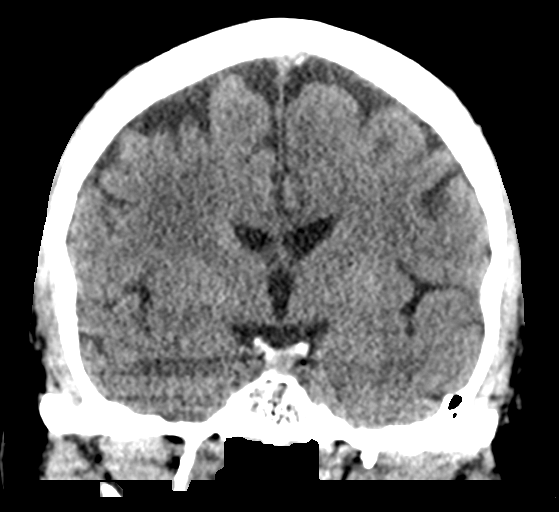

[Series 5: sagittal soft tissue · sagittal · 0.29mm/px · 3 of 52 slices shown]
[im 18/52  brain]
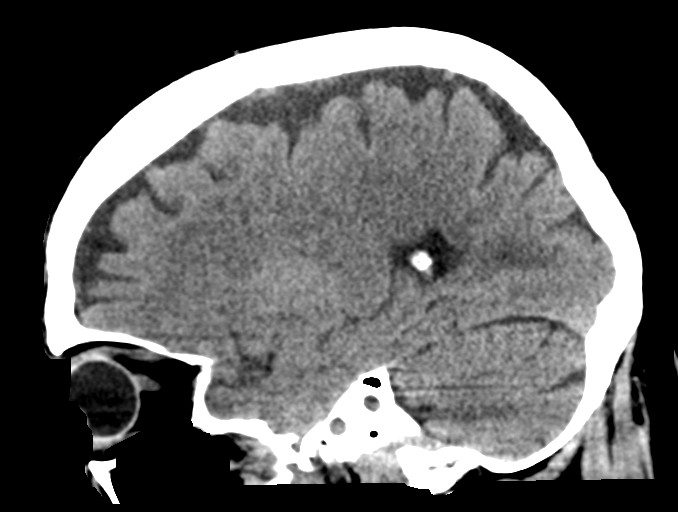
[im 26/52  brain]
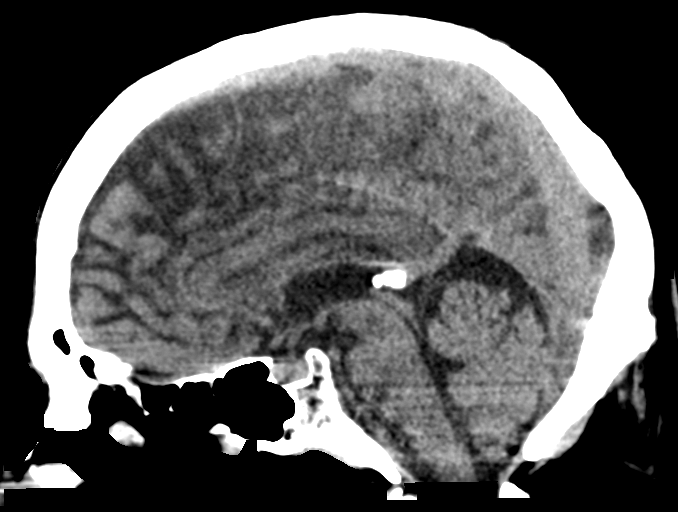
[im 35/52  brain]
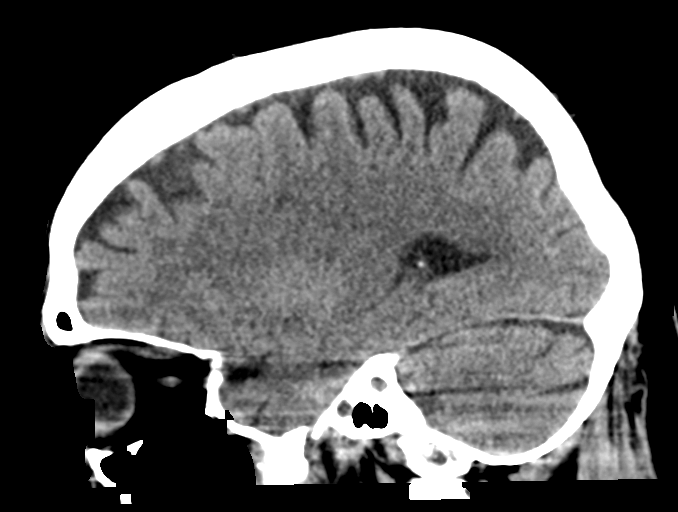

[15 of 47 positions shown; findings below may reference images not displayed]

FINDINGS: Brain: Cerebral volume is within normal limits for age. No midline
shift, ventriculomegaly, mass effect, evidence of mass lesion,
intracranial hemorrhage or evidence of cortically based acute
infarction.

Asymmetric nodular hypodensity in the ventral medial left thalamus.
Elsewhere gray-white matter differentiation is within normal limits
throughout the brain.

Vascular: Mild Calcified atherosclerosis at the skull base. Faint
basal ganglia vascular calcifications also. No suspicious
intracranial vascular hyperdensity.

Skull: Intact, negative.

Sinuses/Orbits: Mild paranasal sinus mucosal thickening greater on
the left. Tympanic cavities and mastoids are clear.

Other: Visualized orbits and scalp soft tissues are within normal
limits.
IMPRESSION: 1. Age indeterminate small vessel disease in the ventral left
thalamus. Right side symptoms would result.
2. Otherwise negative for age noncontrast CT appearance of the
brain.
3. Minor paranasal sinus mucosal thickening.

## 2023-05-31 IMAGING — MR MR LUMBAR SPINE WO/W CM
4 of 7 series · 22 of 48 positions shown · IV contrast (gadavist)
Comparison: Lumbar spine radiographs 11/20/2021

CLINICAL DATA: Weakness in left leg, multiple falls in the past 2
weeks

EXAM:
MRI LUMBAR SPINE WITHOUT AND WITH CONTRAST
TECHNIQUE: Multiplanar and multiecho pulse sequences of the lumbar spine were
obtained without and with intravenous contrast.
CONTRAST:  10mL GADAVIST GADOBUTROL 1 MMOL/ML IV SOLN

[Series 5: T2 · sagittal · 4.0mm · 0.73mm/px · 5 of 17 slices shown (1 of 2)]
[im 1/17]
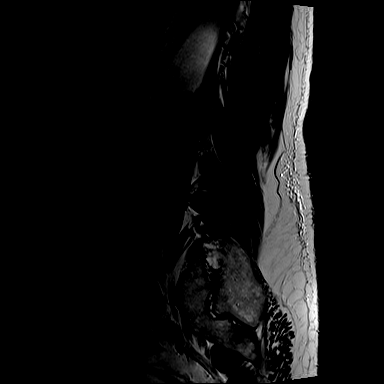
[im 5/17]
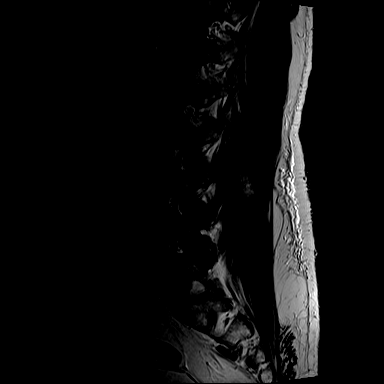
[im 9/17]
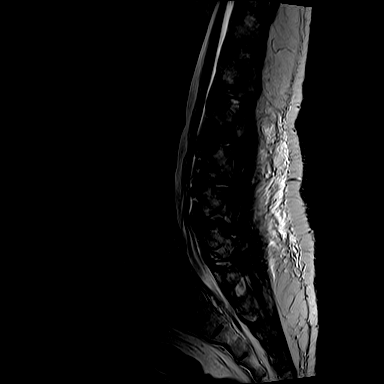
[im 13/17]
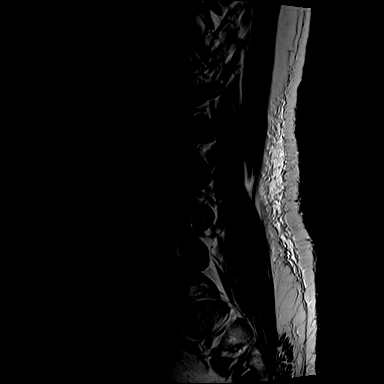
[im 17/17]
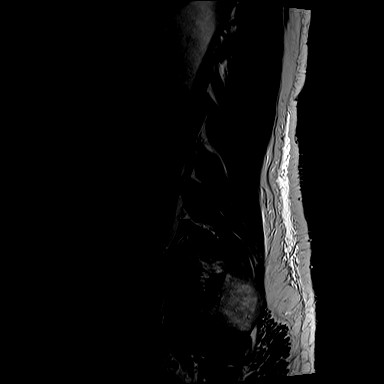

[Series 7: T1 · sagittal · 4.0mm · 0.88mm/px · 4 of 17 slices shown (1 of 2)]
[im 1/17]
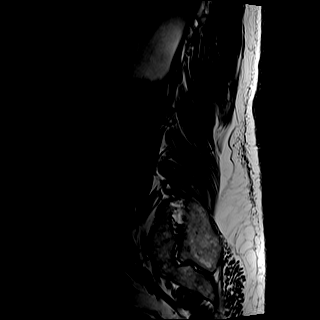
[im 6/17]
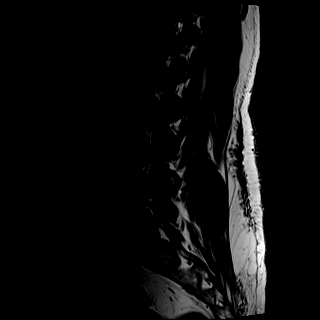
[im 11/17]
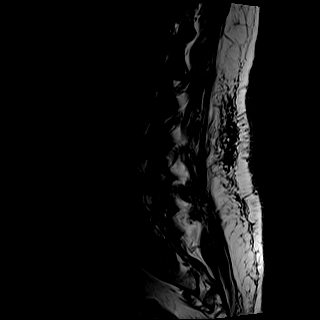
[im 17/17]
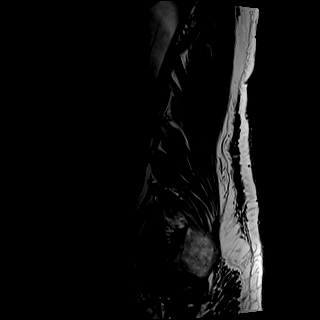

[Series 8: T2 · axial · 4.0mm · 0.57mm/px · z∈[-143,+87]mm · 8 of 38 slices shown (2 of 2)]
[im 1/38]
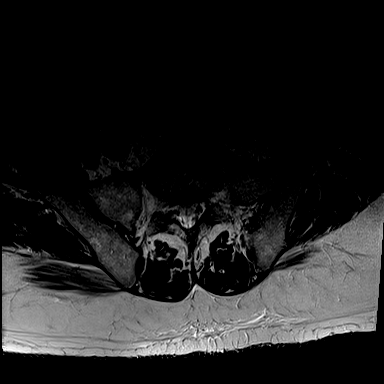
[im 5/38]
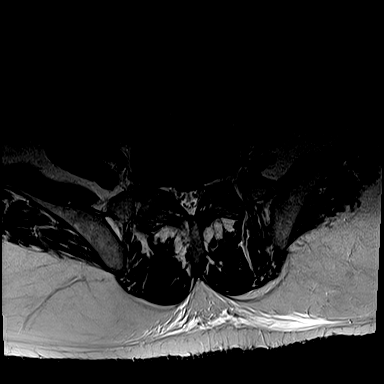
[im 13/38]
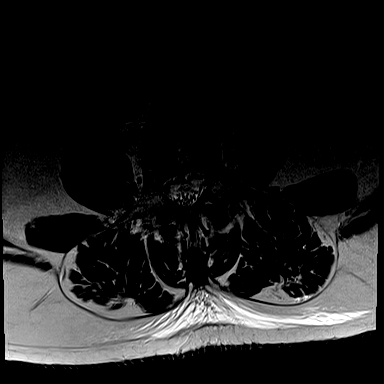
[im 17/38]
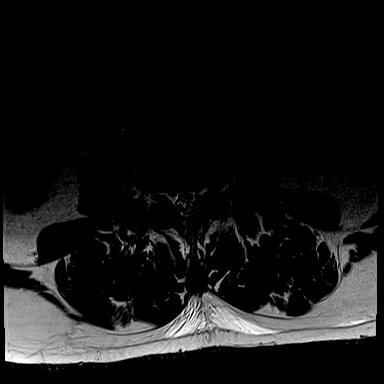
[im 21/38]
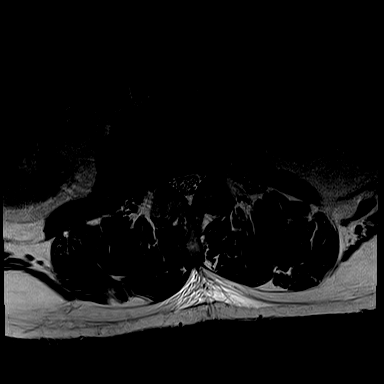
[im 25/38]
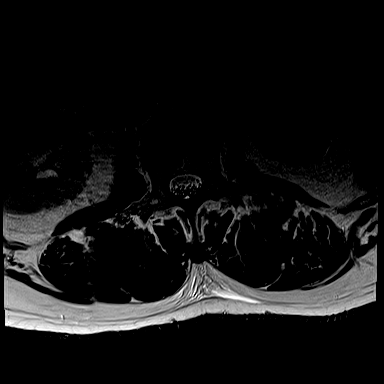
[im 33/38]
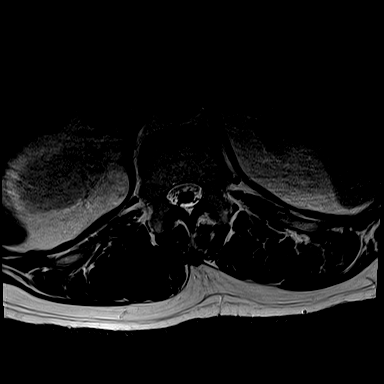
[im 38/38]
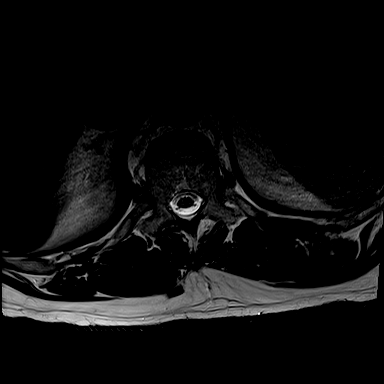

[Series 9: T1 · axial · 4.0mm · 0.34mm/px · z∈[-143,+62]mm · 5 of 38 slices shown (2 of 2)]
[im 1/38]
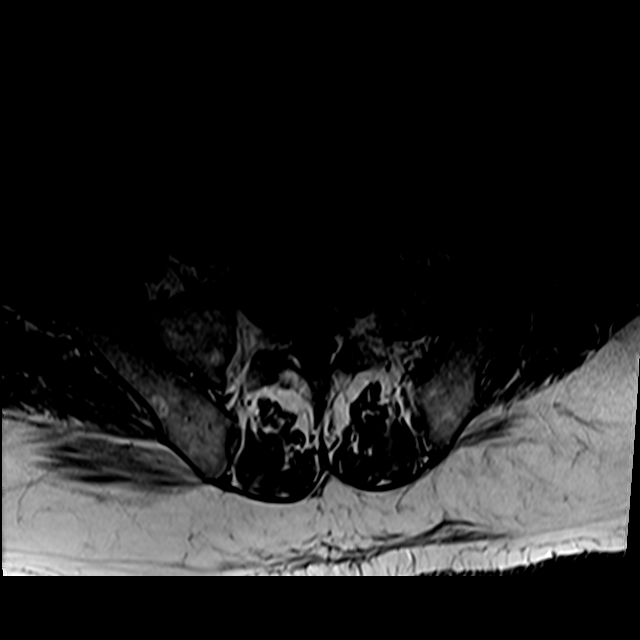
[im 5/38]
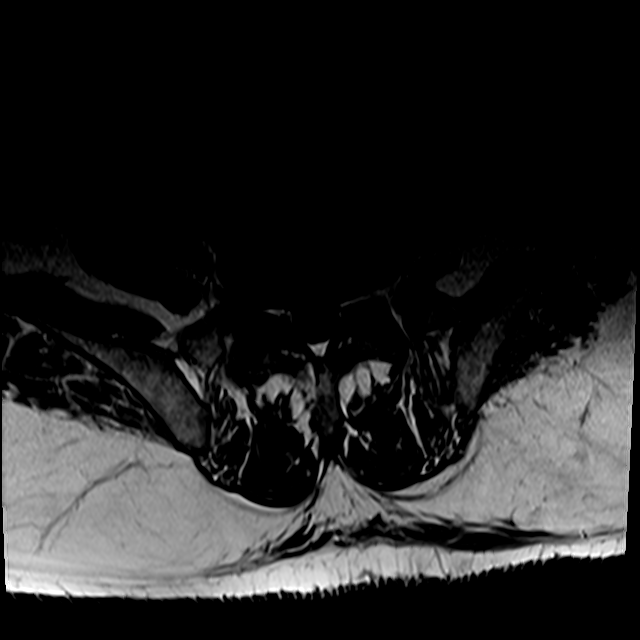
[im 13/38]
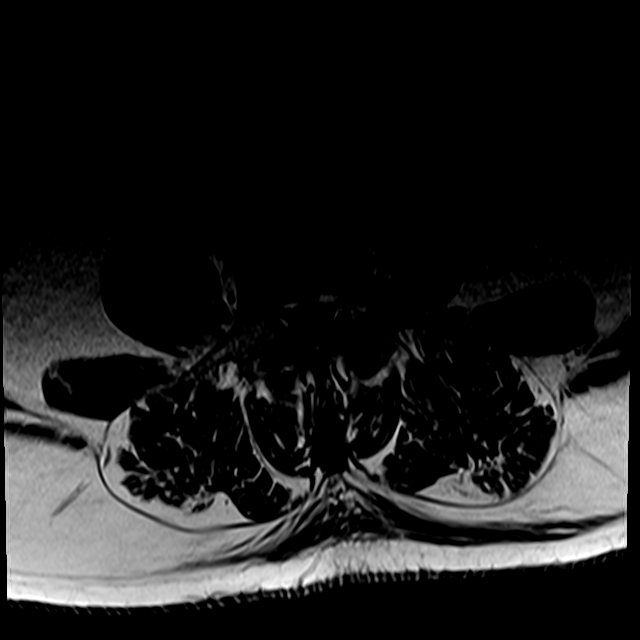
[im 21/38]
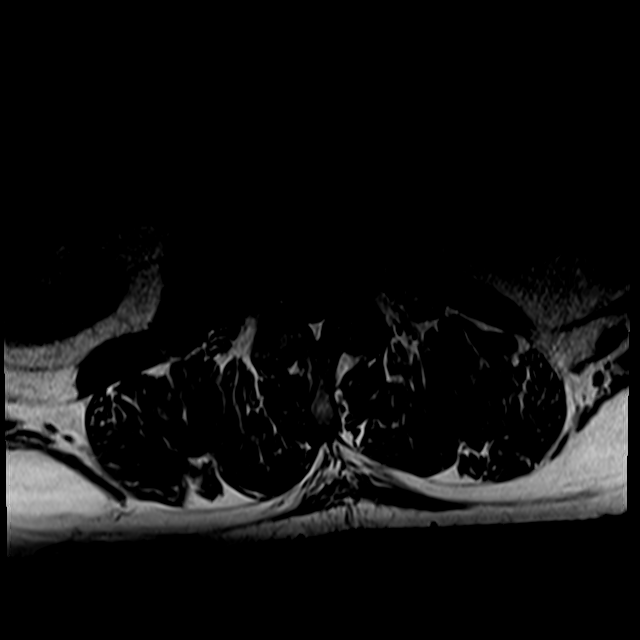
[im 33/38]
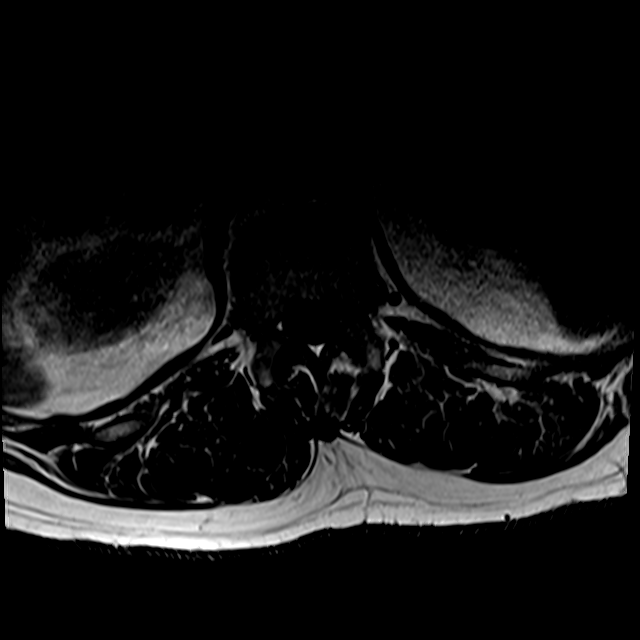

[22 of 48 positions shown; findings below may reference images not displayed]

FINDINGS: Segmentation: Transitional anatomy is noted there is sacralization
of the L5 vertebral body. For the purposes of this report, the
lowest rudimentary disc space is designated L5-S1 which is in
keeping with a lumbar spine radiographs from earlier the same day.

Alignment:  Normal.

Vertebrae: Vertebral body heights are preserved. There is mild
degenerative marrow signal abnormality at L2-L3. Small Schmorl's
nodes are noted along the superior L1 and L2 endplates. There is no
suspicious marrow signal abnormality. There is no abnormal marrow
enhancement.

Conus medullaris and cauda equina: Conus extends to the mid L1
level. Conus and cauda equina appear normal. There is no abnormal
enhancement of the conus or cauda equina nerve roots.

Paraspinal and other soft tissues: There is mild aneurysmal dilation
of the infrarenal abdominal aorta measuring up to 3.2 cm, unchanged
compared to the prior abdominal CT of 07/17/2021. Signal abnormality
in the right kidney likely reflects the prominent renal stone as
seen on prior CT.

Disc levels:

There is mild disc desiccation and narrowing at L4-L5. The other
disc spaces are overall preserved. There is mild multilevel facet
arthropathy, most advanced at L3-L4. Small facet joint effusions are
noted bilaterally at L2-L3 and on the left at L3-L4 and L4-L5.

T12-L1: No significant spinal canal or neural foraminal stenosis

L1-L2: There is a mild disc bulge and mild bilateral facet
arthropathy resulting in mild left and no significant right neural
foraminal stenosis.

L2-L3: There is a mild disc bulge and mild bilateral facet
arthropathy resulting in mild bilateral neural foraminal stenosis
and mild crowding of the left subarticular zone without evidence of
frank nerve root impingement.

L3-L4: There is a diffuse disc bulge with bilateral foraminal
components, ligamentum flavum thickening, and bilateral facet
arthropathy resulting in mild spinal canal stenosis with crowding of
the bilateral subarticular zones and possible mass effect on the
traversing left L4 nerve root, and mild bilateral neural foraminal
stenosis.

L4-L5: There is a diffuse disc bulge with a superimposed central
protrusion and mild bilateral facet arthropathy resulting in
crowding of the bilateral subarticular zones with possible mass
effect on the traversing left L5 nerve root and mild right and no
significant left neural foraminal stenosis.

L5-S1: No significant spinal canal or neural foraminal stenosis.
IMPRESSION: 1. Diffuse disc bulge with superimposed central protrusion at L4-L5
resulting in crowding of the subarticular zones with possible mass
effect on the traversing left L5 nerve root.
2. Mild disc bulge at L3-L4 resulting in crowding of the
subarticular zones with possible mass effect on the traversing left
L4 nerve root.
3. Otherwise, no high-grade spinal canal or neural foraminal
stenosis, and no other evidence of nerve root impingement.
4. Multilevel facet arthropathy, most advanced at L3-L4, with small
facet joint effusions bilaterally at L2-L3 and on the left at L3-L4
and L4-L5.
5. Unchanged 3.2 cm infrarenal abdominal aortic aneurysm. Follow-up
imaging is recommended every 3 years.

## 2023-05-31 IMAGING — DX DG CHEST 1V PORT
1 series · 1 of 1 positions shown · non-contrast
Comparison: Chest x-ray 12/04/2020, CT chest 10/08/2021

CLINICAL DATA: cough Per triage: Pt complains of increased weakness
to his left leg. Pt states that he has had multiple falls in the
past 2 weeks due to his left leg giving out.

EXAM:
PORTABLE CHEST 1 VIEW

[chest ap]
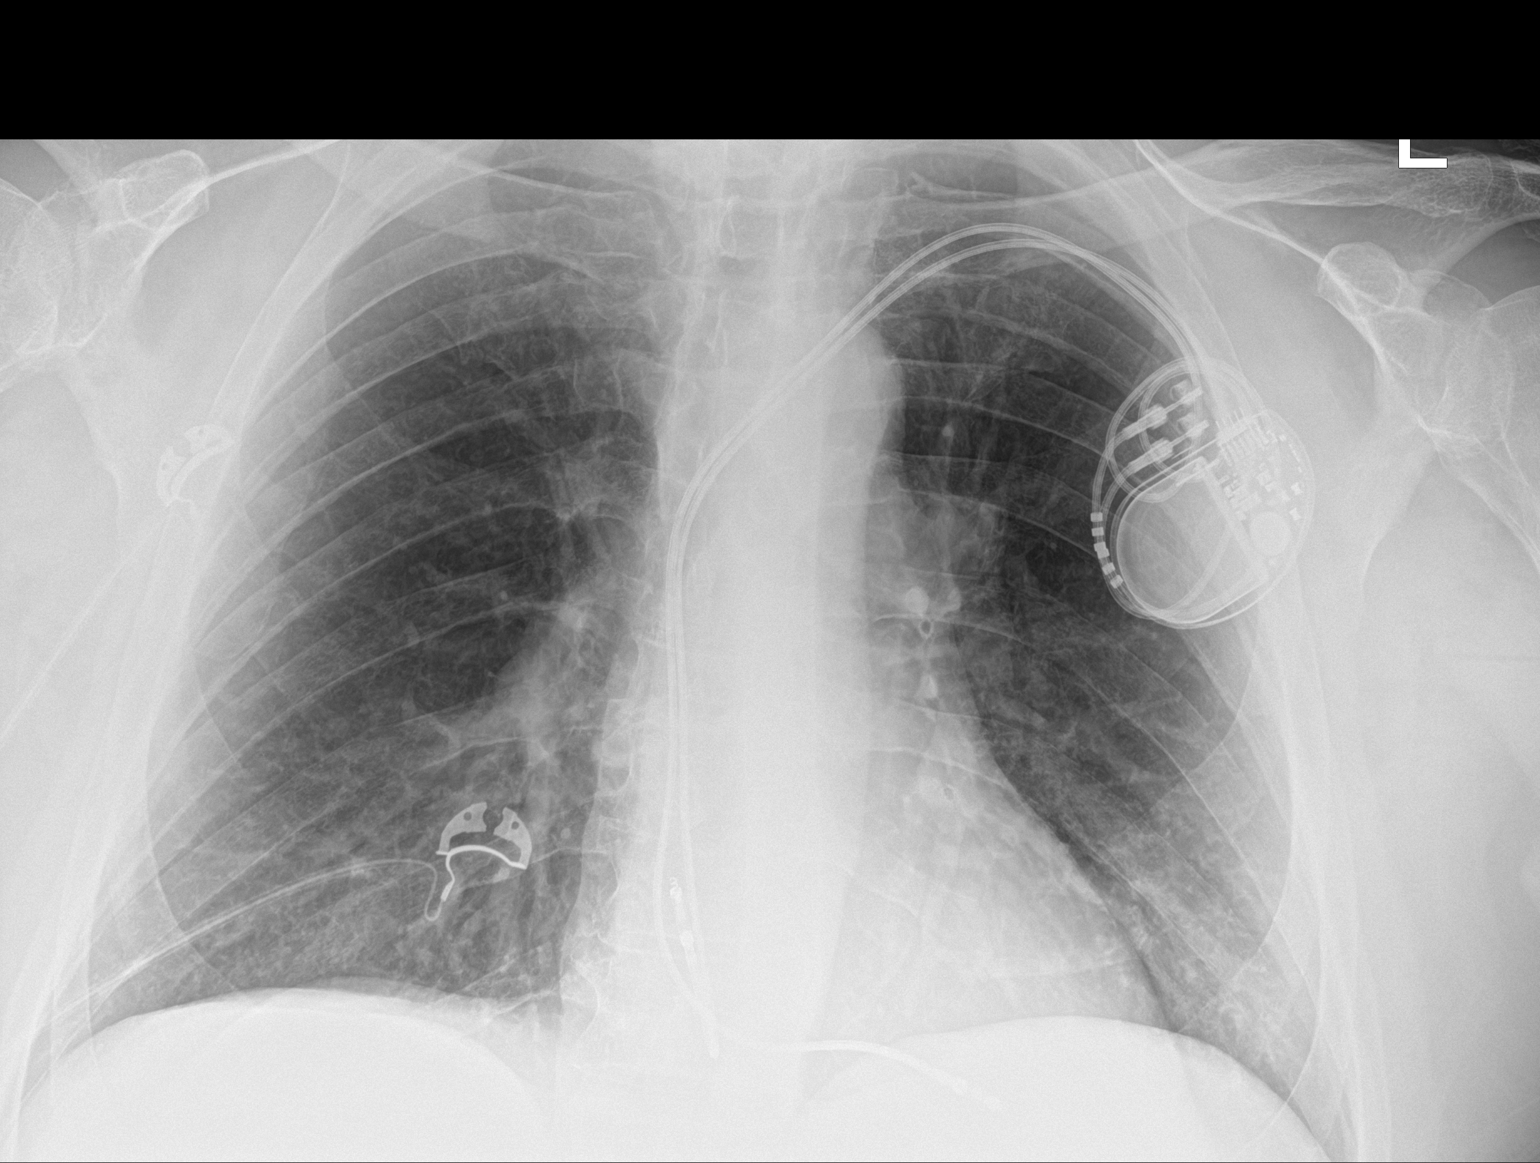

[1 of 1 positions shown; findings below may reference images not displayed]

FINDINGS: The heart and mediastinal contours are unchanged. Left chest wall to
the pacemaker noted in similar position.

No focal consolidation. No pulmonary edema. No pleural effusion. No
pneumothorax.

No acute osseous abnormality.
IMPRESSION: No active disease.
